# Patient Record
Sex: Male | Born: 1945
Health system: Southern US, Community
[De-identification: ages and names within clinical notes are randomized; demographics above are authoritative.]

## PROBLEM LIST (undated history)

## (undated) DIAGNOSIS — R0602 Shortness of breath: Secondary | ICD-10-CM

## (undated) DIAGNOSIS — E785 Hyperlipidemia, unspecified: Secondary | ICD-10-CM

## (undated) DIAGNOSIS — Z9889 Other specified postprocedural states: Secondary | ICD-10-CM

## (undated) DIAGNOSIS — Z5189 Encounter for other specified aftercare: Secondary | ICD-10-CM

## (undated) DIAGNOSIS — F32A Depression, unspecified: Secondary | ICD-10-CM

## (undated) DIAGNOSIS — R7303 Prediabetes: Secondary | ICD-10-CM

## (undated) DIAGNOSIS — J45909 Unspecified asthma, uncomplicated: Secondary | ICD-10-CM

## (undated) DIAGNOSIS — T7840XA Allergy, unspecified, initial encounter: Secondary | ICD-10-CM

## (undated) DIAGNOSIS — G4730 Sleep apnea, unspecified: Secondary | ICD-10-CM

## (undated) DIAGNOSIS — I10 Essential (primary) hypertension: Secondary | ICD-10-CM

## (undated) DIAGNOSIS — F419 Anxiety disorder, unspecified: Secondary | ICD-10-CM

## (undated) DIAGNOSIS — K5903 Drug induced constipation: Secondary | ICD-10-CM

## (undated) DIAGNOSIS — I2510 Atherosclerotic heart disease of native coronary artery without angina pectoris: Secondary | ICD-10-CM

## (undated) DIAGNOSIS — G4733 Obstructive sleep apnea (adult) (pediatric): Secondary | ICD-10-CM

## (undated) DIAGNOSIS — F4310 Post-traumatic stress disorder, unspecified: Secondary | ICD-10-CM

## (undated) DIAGNOSIS — Z972 Presence of dental prosthetic device (complete) (partial): Secondary | ICD-10-CM

## (undated) DIAGNOSIS — D689 Coagulation defect, unspecified: Secondary | ICD-10-CM

## (undated) DIAGNOSIS — D649 Anemia, unspecified: Secondary | ICD-10-CM

## (undated) DIAGNOSIS — I2699 Other pulmonary embolism without acute cor pulmonale: Secondary | ICD-10-CM

## (undated) DIAGNOSIS — K219 Gastro-esophageal reflux disease without esophagitis: Secondary | ICD-10-CM

## (undated) DIAGNOSIS — F329 Major depressive disorder, single episode, unspecified: Secondary | ICD-10-CM

## (undated) DIAGNOSIS — Z970 Presence of artificial eye: Secondary | ICD-10-CM

## (undated) DIAGNOSIS — H269 Unspecified cataract: Secondary | ICD-10-CM

## (undated) DIAGNOSIS — M1990 Unspecified osteoarthritis, unspecified site: Secondary | ICD-10-CM

## (undated) DIAGNOSIS — C801 Malignant (primary) neoplasm, unspecified: Secondary | ICD-10-CM

## (undated) DIAGNOSIS — L409 Psoriasis, unspecified: Secondary | ICD-10-CM

## (undated) DIAGNOSIS — G4700 Insomnia, unspecified: Secondary | ICD-10-CM

## (undated) DIAGNOSIS — R51 Headache: Secondary | ICD-10-CM

## (undated) DIAGNOSIS — IMO0001 Reserved for inherently not codable concepts without codable children: Secondary | ICD-10-CM

## (undated) DIAGNOSIS — R112 Nausea with vomiting, unspecified: Secondary | ICD-10-CM

## (undated) DIAGNOSIS — K429 Umbilical hernia without obstruction or gangrene: Secondary | ICD-10-CM

## (undated) DIAGNOSIS — R011 Cardiac murmur, unspecified: Secondary | ICD-10-CM

## (undated) DIAGNOSIS — K08109 Complete loss of teeth, unspecified cause, unspecified class: Secondary | ICD-10-CM

## (undated) DIAGNOSIS — J302 Other seasonal allergic rhinitis: Secondary | ICD-10-CM

## (undated) HISTORY — DX: Unspecified asthma, uncomplicated: J45.909

## (undated) HISTORY — PX: TONSILLECTOMY: SUR1361

## (undated) HISTORY — PX: CORONARY ANGIOPLASTY: SHX604

## (undated) HISTORY — DX: Essential (primary) hypertension: I10

## (undated) HISTORY — PX: EYE SURGERY: SHX253

## (undated) HISTORY — PX: OTHER SURGICAL HISTORY: SHX169

## (undated) HISTORY — DX: Other pulmonary embolism without acute cor pulmonale: I26.99

## (undated) HISTORY — DX: Encounter for other specified aftercare: Z51.89

## (undated) HISTORY — PX: KNEE ARTHROSCOPY: SHX127

## (undated) HISTORY — DX: Insomnia, unspecified: G47.00

## (undated) HISTORY — PX: VASECTOMY: SHX75

## (undated) HISTORY — DX: Atherosclerotic heart disease of native coronary artery without angina pectoris: I25.10

## (undated) HISTORY — DX: Obstructive sleep apnea (adult) (pediatric): G47.33

## (undated) HISTORY — PX: COLONOSCOPY W/ BIOPSIES AND POLYPECTOMY: SHX1376

## (undated) HISTORY — DX: Allergy, unspecified, initial encounter: T78.40XA

## (undated) HISTORY — DX: Coagulation defect, unspecified: D68.9

## (undated) HISTORY — DX: Depression, unspecified: F32.A

## (undated) HISTORY — PX: MULTIPLE TOOTH EXTRACTIONS: SHX2053

## (undated) HISTORY — DX: Gastro-esophageal reflux disease without esophagitis: K21.9

## (undated) HISTORY — PX: JOINT REPLACEMENT: SHX530

## (undated) HISTORY — PX: CARDIAC CATHETERIZATION: SHX172

## (undated) HISTORY — DX: Major depressive disorder, single episode, unspecified: F32.9

## (undated) HISTORY — DX: Hyperlipidemia, unspecified: E78.5

## (undated) HISTORY — DX: Sleep apnea, unspecified: G47.30

## (undated) HISTORY — DX: Unspecified osteoarthritis, unspecified site: M19.90

## (undated) HISTORY — PX: ENUCLEATION: SHX628

## (undated) HISTORY — DX: Presence of artificial eye: Z97.0

## (undated) HISTORY — DX: Unspecified cataract: H26.9

## (undated) SURGERY — EGD (ESOPHAGOGASTRODUODENOSCOPY)
Anesthesia: Monitor Anesthesia Care

## (undated) SURGERY — ARTHROPLASTY, KNEE, TOTAL
Anesthesia: Choice | Laterality: Left

---
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
## 1981-07-04 HISTORY — PX: INGUINAL HERNIA REPAIR: SUR1180

## 1983-07-05 HISTORY — PX: INGUINAL HERNIA REPAIR: SUR1180

## 1999-04-12 ENCOUNTER — Encounter: Payer: Self-pay | Admitting: Internal Medicine

## 1999-04-12 ENCOUNTER — Ambulatory Visit (HOSPITAL_COMMUNITY): Admission: RE | Admit: 1999-04-12 | Discharge: 1999-04-12 | Payer: Self-pay | Admitting: Internal Medicine

## 2000-06-30 ENCOUNTER — Ambulatory Visit (HOSPITAL_COMMUNITY): Admission: RE | Admit: 2000-06-30 | Discharge: 2000-06-30 | Payer: Self-pay | Admitting: Internal Medicine

## 2000-06-30 ENCOUNTER — Encounter: Payer: Self-pay | Admitting: Internal Medicine

## 2001-04-25 ENCOUNTER — Ambulatory Visit (HOSPITAL_COMMUNITY): Admission: RE | Admit: 2001-04-25 | Discharge: 2001-04-25 | Payer: Self-pay | Admitting: Internal Medicine

## 2001-04-25 ENCOUNTER — Encounter (INDEPENDENT_AMBULATORY_CARE_PROVIDER_SITE_OTHER): Payer: Self-pay | Admitting: *Deleted

## 2001-05-10 ENCOUNTER — Encounter: Payer: Self-pay | Admitting: Emergency Medicine

## 2001-05-10 ENCOUNTER — Emergency Department (HOSPITAL_COMMUNITY): Admission: EM | Admit: 2001-05-10 | Discharge: 2001-05-10 | Payer: Self-pay | Admitting: Emergency Medicine

## 2001-06-04 ENCOUNTER — Ambulatory Visit (HOSPITAL_COMMUNITY): Admission: RE | Admit: 2001-06-04 | Discharge: 2001-06-04 | Payer: Self-pay | Admitting: Internal Medicine

## 2001-06-04 ENCOUNTER — Encounter: Payer: Self-pay | Admitting: Internal Medicine

## 2001-07-15 ENCOUNTER — Ambulatory Visit (HOSPITAL_BASED_OUTPATIENT_CLINIC_OR_DEPARTMENT_OTHER): Admission: RE | Admit: 2001-07-15 | Discharge: 2001-07-15 | Payer: Self-pay | Admitting: Internal Medicine

## 2001-09-17 ENCOUNTER — Ambulatory Visit (HOSPITAL_COMMUNITY): Admission: RE | Admit: 2001-09-17 | Discharge: 2001-09-17 | Payer: Self-pay | Admitting: Pulmonary Disease

## 2001-09-17 ENCOUNTER — Encounter: Payer: Self-pay | Admitting: Pulmonary Disease

## 2002-01-14 ENCOUNTER — Encounter (INDEPENDENT_AMBULATORY_CARE_PROVIDER_SITE_OTHER): Payer: Self-pay | Admitting: Specialist

## 2002-01-14 ENCOUNTER — Ambulatory Visit (HOSPITAL_BASED_OUTPATIENT_CLINIC_OR_DEPARTMENT_OTHER): Admission: RE | Admit: 2002-01-14 | Discharge: 2002-01-15 | Payer: Self-pay | Admitting: *Deleted

## 2002-02-21 ENCOUNTER — Encounter: Payer: Self-pay | Admitting: Internal Medicine

## 2002-02-21 ENCOUNTER — Ambulatory Visit (HOSPITAL_COMMUNITY): Admission: RE | Admit: 2002-02-21 | Discharge: 2002-02-21 | Payer: Self-pay | Admitting: Internal Medicine

## 2002-02-26 ENCOUNTER — Encounter: Admission: RE | Admit: 2002-02-26 | Discharge: 2002-02-26 | Payer: Self-pay | Admitting: Internal Medicine

## 2002-02-26 ENCOUNTER — Encounter: Payer: Self-pay | Admitting: Internal Medicine

## 2002-07-09 ENCOUNTER — Ambulatory Visit (HOSPITAL_COMMUNITY): Admission: RE | Admit: 2002-07-09 | Discharge: 2002-07-09 | Payer: Self-pay | Admitting: Internal Medicine

## 2002-07-09 ENCOUNTER — Encounter: Payer: Self-pay | Admitting: Internal Medicine

## 2004-01-28 ENCOUNTER — Emergency Department (HOSPITAL_COMMUNITY): Admission: EM | Admit: 2004-01-28 | Discharge: 2004-01-28 | Payer: Self-pay | Admitting: Emergency Medicine

## 2004-02-02 ENCOUNTER — Inpatient Hospital Stay (HOSPITAL_BASED_OUTPATIENT_CLINIC_OR_DEPARTMENT_OTHER): Admission: RE | Admit: 2004-02-02 | Discharge: 2004-02-02 | Payer: Self-pay | Admitting: Cardiology

## 2004-02-13 ENCOUNTER — Ambulatory Visit (HOSPITAL_COMMUNITY): Admission: RE | Admit: 2004-02-13 | Discharge: 2004-02-14 | Payer: Self-pay | Admitting: Cardiology

## 2004-02-13 HISTORY — PX: CORONARY STENT PLACEMENT: SHX1402

## 2004-04-23 ENCOUNTER — Emergency Department (HOSPITAL_COMMUNITY): Admission: EM | Admit: 2004-04-23 | Discharge: 2004-04-23 | Payer: Self-pay | Admitting: Emergency Medicine

## 2004-05-25 ENCOUNTER — Encounter: Admission: RE | Admit: 2004-05-25 | Discharge: 2004-05-25 | Payer: Self-pay | Admitting: Family Medicine

## 2004-06-02 ENCOUNTER — Ambulatory Visit (HOSPITAL_BASED_OUTPATIENT_CLINIC_OR_DEPARTMENT_OTHER): Admission: RE | Admit: 2004-06-02 | Discharge: 2004-06-02 | Payer: Self-pay | Admitting: Family Medicine

## 2004-06-16 ENCOUNTER — Ambulatory Visit: Payer: Self-pay | Admitting: Psychiatry

## 2004-06-16 ENCOUNTER — Other Ambulatory Visit (HOSPITAL_COMMUNITY): Admission: RE | Admit: 2004-06-16 | Discharge: 2004-06-22 | Payer: Self-pay | Admitting: Psychiatry

## 2004-06-22 ENCOUNTER — Inpatient Hospital Stay (HOSPITAL_COMMUNITY): Admission: RE | Admit: 2004-06-22 | Discharge: 2004-06-25 | Payer: Self-pay | Admitting: Psychiatry

## 2004-06-29 ENCOUNTER — Other Ambulatory Visit (HOSPITAL_COMMUNITY): Admission: RE | Admit: 2004-06-29 | Discharge: 2004-09-27 | Payer: Self-pay | Admitting: Psychiatry

## 2004-08-17 ENCOUNTER — Encounter: Admission: RE | Admit: 2004-08-17 | Discharge: 2004-08-17 | Payer: Self-pay | Admitting: Family Medicine

## 2004-11-09 ENCOUNTER — Encounter: Admission: RE | Admit: 2004-11-09 | Discharge: 2004-11-09 | Payer: Self-pay | Admitting: Family Medicine

## 2004-11-18 ENCOUNTER — Ambulatory Visit (HOSPITAL_COMMUNITY): Admission: RE | Admit: 2004-11-18 | Discharge: 2004-11-18 | Payer: Self-pay | Admitting: *Deleted

## 2004-11-19 ENCOUNTER — Encounter: Admission: RE | Admit: 2004-11-19 | Discharge: 2004-11-19 | Payer: Self-pay | Admitting: Specialist

## 2004-11-25 ENCOUNTER — Encounter: Admission: RE | Admit: 2004-11-25 | Discharge: 2004-11-25 | Payer: Self-pay | Admitting: Specialist

## 2004-12-01 ENCOUNTER — Encounter: Admission: RE | Admit: 2004-12-01 | Discharge: 2004-12-01 | Payer: Self-pay | Admitting: Family Medicine

## 2004-12-06 ENCOUNTER — Ambulatory Visit (HOSPITAL_COMMUNITY): Admission: RE | Admit: 2004-12-06 | Discharge: 2004-12-06 | Payer: Self-pay | Admitting: Family Medicine

## 2004-12-16 ENCOUNTER — Ambulatory Visit: Payer: Self-pay | Admitting: Infectious Diseases

## 2004-12-18 ENCOUNTER — Ambulatory Visit (HOSPITAL_COMMUNITY): Admission: RE | Admit: 2004-12-18 | Discharge: 2004-12-18 | Payer: Self-pay | Admitting: Infectious Diseases

## 2004-12-22 ENCOUNTER — Ambulatory Visit: Payer: Self-pay | Admitting: Infectious Diseases

## 2005-04-26 ENCOUNTER — Encounter: Admission: RE | Admit: 2005-04-26 | Discharge: 2005-04-26 | Payer: Self-pay | Admitting: Family Medicine

## 2005-07-08 ENCOUNTER — Ambulatory Visit: Admission: RE | Admit: 2005-07-08 | Discharge: 2005-07-08 | Payer: Self-pay | Admitting: Specialist

## 2006-01-05 ENCOUNTER — Encounter: Admission: RE | Admit: 2006-01-05 | Discharge: 2006-01-05 | Payer: Self-pay | Admitting: Family Medicine

## 2006-03-07 ENCOUNTER — Encounter: Admission: RE | Admit: 2006-03-07 | Discharge: 2006-03-07 | Payer: Self-pay | Admitting: Specialist

## 2006-03-31 ENCOUNTER — Inpatient Hospital Stay (HOSPITAL_COMMUNITY): Admission: EM | Admit: 2006-03-31 | Discharge: 2006-04-04 | Payer: Self-pay | Admitting: Emergency Medicine

## 2006-04-05 ENCOUNTER — Ambulatory Visit: Payer: Self-pay | Admitting: Internal Medicine

## 2006-04-24 ENCOUNTER — Ambulatory Visit: Payer: Self-pay | Admitting: Internal Medicine

## 2006-05-23 LAB — CBC WITH DIFFERENTIAL/PLATELET
EOS%: 5.4 % (ref 0.0–7.0)
MCH: 30.1 pg (ref 28.0–33.4)
MCHC: 34.5 g/dL (ref 32.0–35.9)
MCV: 87.1 fL (ref 81.6–98.0)
MONO%: 11.2 % (ref 0.0–13.0)
RBC: 4.34 10*6/uL (ref 4.20–5.71)
RDW: 10.9 % — ABNORMAL LOW (ref 11.2–14.6)

## 2006-05-23 LAB — PROTIME-INR

## 2007-05-11 ENCOUNTER — Ambulatory Visit: Payer: Self-pay | Admitting: Internal Medicine

## 2007-05-15 LAB — CBC WITH DIFFERENTIAL/PLATELET
BASO%: 0.5 % (ref 0.0–2.0)
HCT: 39.6 % (ref 38.7–49.9)
MCHC: 34.9 g/dL (ref 32.0–35.9)
MONO#: 0.6 10*3/uL (ref 0.1–0.9)
NEUT%: 66.9 % (ref 40.0–75.0)
RBC: 4.76 10*6/uL (ref 4.20–5.71)
WBC: 5.7 10*3/uL (ref 4.0–10.0)
lymph#: 1.2 10*3/uL (ref 0.9–3.3)

## 2007-05-21 LAB — HYPERCOAGULABLE PANEL, COMPREHENSIVE
AntiThromb III Func: 89 % (ref 76–126)
Anticardiolipin IgA: <7 [APL'U] (ref <13)
Anticardiolipin IgM: 7 [MPL'U] (ref <10)
Beta-2-Glycoprotein I IgA: 5 U/mL (ref <10)
Beta-2-Glycoprotein I IgM: 23 U/mL (ref <10)
PTT Lupus Anticoagulant: 66 secs — ABNORMAL HIGH (ref 36.3–48.8)
PTTLA 4:1 Mix: 49.8 secs — ABNORMAL HIGH (ref 36.3–48.8)
PTTLA Confirmation: 0 secs (ref <8.0)

## 2007-06-07 ENCOUNTER — Encounter: Admission: RE | Admit: 2007-06-07 | Discharge: 2007-06-07 | Payer: Self-pay | Admitting: Family Medicine

## 2007-07-13 ENCOUNTER — Ambulatory Visit: Payer: Self-pay | Admitting: Internal Medicine

## 2007-07-20 LAB — HYPERCOAGULABLE PANEL, COMPREHENSIVE RET.
AntiThromb III Func: 87 % (ref 76–126)
Anticardiolipin IgA: 12 [APL'U] (ref <13)
Anticardiolipin IgG: 7 [GPL'U] (ref <11)
Beta-2-Glycoprotein I IgA: <4 U/mL (ref <10)
Beta-2-Glycoprotein I IgM: 13 U/mL (ref <10)
DRVVT: 41 secs (ref 36.1–47.0)
Homocysteine: 13.2 umol/L (ref 4.0–15.4)
PTT Lupus Anticoagulant: 45.9 secs (ref 36.3–48.8)
Protein S Ag, Total: 121 % (ref 70–140)

## 2007-09-03 ENCOUNTER — Ambulatory Visit: Payer: Self-pay | Admitting: Internal Medicine

## 2007-12-14 ENCOUNTER — Emergency Department (HOSPITAL_COMMUNITY): Admission: EM | Admit: 2007-12-14 | Discharge: 2007-12-14 | Payer: Self-pay | Admitting: Emergency Medicine

## 2008-09-12 ENCOUNTER — Encounter: Admission: RE | Admit: 2008-09-12 | Discharge: 2008-09-12 | Payer: Self-pay | Admitting: Family Medicine

## 2008-12-22 ENCOUNTER — Encounter: Admission: RE | Admit: 2008-12-22 | Discharge: 2008-12-22 | Payer: Self-pay | Admitting: Family Medicine

## 2009-02-13 ENCOUNTER — Emergency Department (HOSPITAL_COMMUNITY): Admission: EM | Admit: 2009-02-13 | Discharge: 2009-02-13 | Payer: Self-pay | Admitting: Emergency Medicine

## 2010-01-25 ENCOUNTER — Encounter: Admission: RE | Admit: 2010-01-25 | Discharge: 2010-01-25 | Payer: Self-pay | Admitting: Internal Medicine

## 2010-10-15 ENCOUNTER — Encounter: Payer: Self-pay | Admitting: Cardiology

## 2010-10-15 ENCOUNTER — Encounter: Payer: Self-pay | Admitting: Family Medicine

## 2010-10-26 ENCOUNTER — Encounter: Payer: Self-pay | Admitting: Cardiology

## 2010-10-26 DIAGNOSIS — K219 Gastro-esophageal reflux disease without esophagitis: Secondary | ICD-10-CM | POA: Insufficient documentation

## 2010-10-26 DIAGNOSIS — I2510 Atherosclerotic heart disease of native coronary artery without angina pectoris: Secondary | ICD-10-CM | POA: Insufficient documentation

## 2010-10-26 DIAGNOSIS — E785 Hyperlipidemia, unspecified: Secondary | ICD-10-CM | POA: Insufficient documentation

## 2010-10-26 DIAGNOSIS — F329 Major depressive disorder, single episode, unspecified: Secondary | ICD-10-CM | POA: Insufficient documentation

## 2010-10-26 DIAGNOSIS — I2699 Other pulmonary embolism without acute cor pulmonale: Secondary | ICD-10-CM | POA: Insufficient documentation

## 2010-10-26 DIAGNOSIS — I10 Essential (primary) hypertension: Secondary | ICD-10-CM | POA: Insufficient documentation

## 2010-10-26 DIAGNOSIS — G4700 Insomnia, unspecified: Secondary | ICD-10-CM | POA: Insufficient documentation

## 2010-10-26 DIAGNOSIS — G4733 Obstructive sleep apnea (adult) (pediatric): Secondary | ICD-10-CM | POA: Insufficient documentation

## 2010-10-26 DIAGNOSIS — F32A Depression, unspecified: Secondary | ICD-10-CM | POA: Insufficient documentation

## 2010-11-01 ENCOUNTER — Encounter: Payer: Self-pay | Admitting: Family Medicine

## 2010-11-02 ENCOUNTER — Ambulatory Visit (INDEPENDENT_AMBULATORY_CARE_PROVIDER_SITE_OTHER): Payer: Medicare Other | Admitting: Cardiology

## 2010-11-02 ENCOUNTER — Encounter: Payer: Self-pay | Admitting: Cardiology

## 2010-11-02 VITALS — BP 108/66 | HR 68 | Ht 66.0 in | Wt 247.4 lb

## 2010-11-02 DIAGNOSIS — I2510 Atherosclerotic heart disease of native coronary artery without angina pectoris: Secondary | ICD-10-CM

## 2010-11-02 DIAGNOSIS — I2699 Other pulmonary embolism without acute cor pulmonale: Secondary | ICD-10-CM

## 2010-11-02 DIAGNOSIS — I10 Essential (primary) hypertension: Secondary | ICD-10-CM

## 2010-11-02 DIAGNOSIS — E785 Hyperlipidemia, unspecified: Secondary | ICD-10-CM

## 2010-11-02 NOTE — Assessment & Plan Note (Signed)
Patient has mild anginal symptoms at high level of exertion. Have recommended a followup nuclear stress test to evaluate his current cardiac situation. I recommended continued aggressive risk factor modification. His stress test is normal I would recommend increasing his aerobic activity and weight loss.

## 2010-11-02 NOTE — Patient Instructions (Signed)
We will schedule you for a nuclear stress test.  We will get a copy of your lab work from Dr. Riley Nearing  If your stress test is okay then we need to focus on controlling your cholesterol and blood pressure.  You need to increase your aerobic activity and lose weight.

## 2010-11-02 NOTE — Assessment & Plan Note (Signed)
Blood pressure is well controlled currently. He will stay on the same medication.

## 2010-11-02 NOTE — Assessment & Plan Note (Signed)
We will obtain a copy of his most recent lab work. His goal LDL would be less than or equal to 70.

## 2010-11-02 NOTE — Progress Notes (Signed)
Aaron [name] Date of Birth: [birth year]   History of Present Illness: Aaron [name] is seen at the request of Dr. Riley Nearing for evaluation of chest pain. He is a 65 year old white male with a known history of coronary disease. He underwent stenting of the mid LAD in August of 2005 with a 3.0 x 13 mm Cypher stent. He presented at that time with chest pain and was found to have a 70% lesion in the mid LAD. He had a stress nuclear study which showed evidence of anteroapical ischemia. He has been lost to followup since that time. He does describe symptoms of chest discomfort if he works hard. His main form of activity is mowing grass. If he is mowing a lot of grass and he gets chest discomfort he just rests and the pain goes away. He has occasional shortness of breath.  Current Outpatient Prescriptions on File Prior to Visit  Medication Sig Dispense Refill  . ALPRAZolam (XANAX) 0.5 MG tablet Take 0.5 mg by mouth at bedtime as needed.        . Ascorbic Acid (VITAMIN C) 1000 MG tablet Take 1,000 mg by mouth daily.        Marland Kitchen aspirin 81 MG tablet Take 81 mg by mouth daily.        . Cholecalciferol (VITAMIN D3) 1000 UNITS CAPS Take by mouth daily.        Marland Kitchen ezetimibe-simvastatin (VYTORIN) 10-40 MG per tablet Take 1 tablet by mouth at bedtime.        . fenofibrate micronized (LOFIBRA) 200 MG capsule Take 200 mg by mouth daily before breakfast.        . Glucosamine-Chondroitin 750-600 MG TABS Take 1 tablet by mouth daily.        Marland Kitchen lisinopril-hydrochlorothiazide (PRINZIDE,ZESTORETIC) 20-12.5 MG per tablet Take 1 tablet by mouth 2 (two) times daily.        . meloxicam (MOBIC) 15 MG tablet Take 15 mg by mouth daily.        . Multiple Vitamin (MULTIVITAMIN) tablet Take 1 tablet by mouth daily.        . Omega-3 Fatty Acids (FISH OIL) 1000 MG CAPS Take by mouth daily.        Marland Kitchen omeprazole (PRILOSEC) 20 MG capsule Take 20 mg by mouth daily.        . polyethylene glycol powder (MIRALAX) powder Take 17 g by mouth daily.         . traMADol (ULTRAM) 50 MG tablet Take 50 mg by mouth every 6 (six) hours as needed.        . venlafaxine (EFFEXOR-XR) 37.5 MG 24 hr capsule Take 37.5 mg by mouth 2 (two) times daily.        Marland Kitchen DISCONTD: simvastatin (ZOCOR) 40 MG tablet Take 40 mg by mouth at bedtime.         Allergies  Allergen Reactions  . Amoxicillin     Past Medical History  Diagnosis Date  . Coronary artery disease     DES LAD 2005  . Hypertension   . Hyperlipidemia   . Depression   . GERD (gastroesophageal reflux disease)   . Insomnia   . Sleep apnea, obstructive   . Pulmonary embolism   . Osteoarthritis     Past Surgical History  Procedure Date  . Spinal injections     x9 for back  . Artificial right eye   . Hernia repair     bilateral  . Carpel tunnel release  History  Smoking status  . Former Smoker  . Types: Cigarettes  . Quit date: 07/10/1995  Smokeless tobacco  . Never Used    History  Alcohol Use No    History reviewed. No pertinent family history.  Review of Systems: The review of systems is positive for chronic arthralgias in his knees and feet. He has occasional cramps in his right calf.  All other systems were reviewed and are negative.  Physical Exam: BP 108/66  Pulse 68  Ht 5\' 6"  (1.676 m)  Wt 247 lb 6 oz (112.209 kg)  BMI 39.93 kg/m2 He is an obese white male in no acute distress. He is normocephalic atraumatic. He has a prosthesis in his right thigh. His oropharynx is clear with upper and lower dental plates. Neck is supple without JVD, adenopathy, thyromegaly, or bruits. Lungs are clear. Cardiac exam reveals a regular rate and rhythm without gallop, murmur, or click. Abdomen is obese, soft, nontender. He has a ventral hernia with an umbilical hernia. Pedal pulses are 2+ and symmetric. He has no edema. Skin is warm and dry. He is alert and oriented x3. Cranial nerves II through XII are intact. LABORATORY DATA: ECG demonstrates normal sinus rhythm with a normal  ECG.  Assessment / Plan:

## 2010-11-09 ENCOUNTER — Telehealth: Payer: Self-pay | Admitting: Cardiology

## 2010-11-09 NOTE — Telephone Encounter (Signed)
PATSY  FAX [phone] NEEDS NUKE AND ECHO

## 2010-11-10 ENCOUNTER — Ambulatory Visit (HOSPITAL_COMMUNITY): Payer: 59 | Attending: Cardiology | Admitting: Radiology

## 2010-11-10 VITALS — Ht 66.0 in | Wt 243.0 lb

## 2010-11-10 DIAGNOSIS — R079 Chest pain, unspecified: Secondary | ICD-10-CM

## 2010-11-10 DIAGNOSIS — I2510 Atherosclerotic heart disease of native coronary artery without angina pectoris: Secondary | ICD-10-CM

## 2010-11-10 MED ORDER — TECHNETIUM TC 99M TETROFOSMIN IV KIT
10.4000 | PACK | Freq: Once | INTRAVENOUS | Status: AC | PRN
  Administered 2010-11-10: 10 via INTRAVENOUS

## 2010-11-10 MED ORDER — TECHNETIUM TC 99M TETROFOSMIN IV KIT
32.3000 | PACK | Freq: Once | INTRAVENOUS | Status: AC | PRN
  Administered 2010-11-10: 32.3 via INTRAVENOUS

## 2010-11-10 MED ORDER — REGADENOSON 0.4 MG/5ML IV SOLN
0.4000 mg | Freq: Once | INTRAVENOUS | Status: AC
  Administered 2010-11-10: 0.4 mg via INTRAVENOUS

## 2010-11-10 MED ORDER — AMINOPHYLLINE 25 MG/ML IV SOLN
75.0000 mg | INTRAVENOUS | Status: AC
  Administered 2010-11-10: 75 mg via INTRAVENOUS

## 2010-11-10 NOTE — Progress Notes (Signed)
[facility] SITE 3 NUCLEAR MED [address] [place] [address] [phone]  Cardiology Nuclear Med Study  Aaron [name] is a 65 y.o. male [id number] 1945-10-12   Nuclear Med Background Indication for Stress Test:  Evaluation for Ischemia, Stent Patency and PTCA Patency History: 08/05 Heart Catheterization: EF 55% 70% LAD single vessel, '05 Myocardial Perfusion Study: anteroapical ischemia and 08/05 Stents:LAD Cardiac Risk Factors: History of Smoking, Hypertension and Lipids  Symptoms:  Chest Pain with Exertion and DOE   Nuclear Pre-Procedure Caffeine/Decaff Intake:  None NPO After: 8:00pm   Lungs:  clear IV 0.9% NS with Angio Cath:  20g  IV Site: L Antecubital  IV Started by:  [name], EMT-P  Chest Size (in):  50 Cup Size: n/a  Height: 5\' 6"  (1.676 m)  Weight:  243 lb (110.224 kg)  BMI:  Body mass index is 39.22 kg/(m^2). Tech Comments: This patient had extreme joint and back pain.  He was given Aminophlylline 75 mg IV. His pain was relieved.     Nuclear Med Study 1 or 2 day study: 1 day  Stress Test Type:  Eugenie Birks  Reading MD: Marca Ancona, MD  Order Authorizing Provider:  P.Jordan  Resting Radionuclide: Technetium 25m Tetrofosmin  Resting Radionuclide Dose: 10.4 mCi   Stress Radionuclide:  Technetium 64m Tetrofosmin  Stress Radionuclide Dose: 32.3 mCi           Stress Protocol Rest HR: 54 Stress HR: 82  Rest BP: 130/64 Stress BP: 130/64  Exercise Time (min): n/a METS: n/a   Predicted Max HR: 156 bpm % Max HR: 52.56 bpm Rate Pressure Product: 21308   Dose of Adenosine (mg):  n/a Dose of Lexiscan: 0.4 mg  Dose of Atropine (mg): n/a Dose of Dobutamine: n/a mcg/kg/min (at max HR)  Stress Test Technologist: Milana Na, EMT-P  Nuclear Technologist:  Domenic Polite, CNMT     Rest Procedure:  Myocardial perfusion imaging was performed at rest 45 minutes following the intravenous administration of Technetium 37m Tetrofosmin. Rest ECG:  Sinus Bradycardia  Stress Procedure:  The patient received IV Lexiscan 0.4 mg over 15-seconds.  Technetium 94m Tetrofosmin injected at 30-seconds.  There were no significant changes with Lexiscan.  Quantitative spect images were obtained after a 45 minute delay. Stress ECG: No significant change from baseline ECG  QPS Raw Data Images:  Normal; no motion artifact; normal heart/lung ratio. Stress Images:  Normal homogeneous uptake in all areas of the myocardium. Rest Images:  Normal homogeneous uptake in all areas of the myocardium. Subtraction (SDS):  There is no evidence of scar or ischemia. Transient Ischemic Dilatation (Normal <1.22):  1.01 Lung/Heart Ratio (Normal <0.45):  0.34  Quantitative Gated Spect Images QGS EDV:  135 ml QGS ESV:  56 ml QGS cine images:  Normal Wall Motion QGS EF: 59%  Impression Exercise Capacity:  Lexiscan with no exercise. BP Response:  Normal blood pressure response. Clinical Symptoms:  Short of breath.  ECG Impression:  No significant ST segment change suggestive of ischemia. Comparison with Prior Nuclear Study: No images to compare  Overall Impression:  Normal stress nuclear study. Amen Staszak [employer]

## 2010-11-11 NOTE — Progress Notes (Signed)
COPY ROUTED TO DR.JORDAN.Falecha Clark ° °

## 2010-11-11 NOTE — Progress Notes (Signed)
Normal nuclear study. Please reassure patient. Continue medical RX. [name]

## 2010-11-11 NOTE — Progress Notes (Signed)
Notified of nuclear stress test results. Is changing PCP and will let us know.

## 2010-11-13 ENCOUNTER — Ambulatory Visit (INDEPENDENT_AMBULATORY_CARE_PROVIDER_SITE_OTHER): Payer: 59

## 2010-11-13 ENCOUNTER — Inpatient Hospital Stay (INDEPENDENT_AMBULATORY_CARE_PROVIDER_SITE_OTHER)
Admission: RE | Admit: 2010-11-13 | Discharge: 2010-11-13 | Disposition: A | Discharge status: Home / Self Care | Payer: 59 | Source: Ambulatory Visit | Attending: Emergency Medicine | Admitting: Emergency Medicine

## 2010-11-13 DIAGNOSIS — S61209A Unspecified open wound of unspecified finger without damage to nail, initial encounter: Secondary | ICD-10-CM

## 2010-11-19 NOTE — H&P (Signed)
[name], [name] NO.:  [id number]   MEDICAL RECORD NO.:  [id number]          PATIENT TYPE:  IPS   LOCATION:  0301                          FACILITY:  [facility]   PHYSICIAN:  Aaron [name], M.D.      DATE OF BIRTH:  02/22/1946   DATE OF ADMISSION:  06/22/2004  DATE OF DISCHARGE:                         PSYCHIATRIC ADMISSION ASSESSMENT   IDENTIFYING INFORMATION:  This is a voluntary admission.  This is a 65-year-  old recently married white male.  Reason for admission and symptoms:  He has  been in the psychiatric IOP for the past 3 days.  Today, he could not  contract for safety.  His wife is driving due to his mind wandering and also  visual problems.  The patient states that he began going downhill  approximately 6 months ago.  In August he was admitted dyspnea on exertion  and anterior chest pain.  He underwent cardiac catheterization and  ultimately had a stent put in to a 30% stenosis.  He was found to not be  diabetic at that time.  Recently, he has come under cardiac care with Dr.  Swaziland.  He has had his medications adjusted.  He and his wife are wondering  if perhaps these medications are causing his depression.  Interestingly  enough, in his family history his mother shot herself in 54.  She had been  medically ill.  His father attempted suicide but was not successful.  His  father ultimately died some years later of medical problems.  His sister who  was living with them at the time his mother shot herself and died, died  later of an illness.  Her youngest son, his nephew, shot himself 2 years  later.  There is no formal history for bipolar illness in the family,  however one would have to really think about this in his situation.   The patient describes multiple stressors.  His wife of almost 33 years died  in 11/11/02 from cancer.  They had decided several months before that  she would not continue treatment and she passed on.  He had to file  bankruptcy in 2002.  He recently met his new wife.  He is very happy with  her.  They just bought a new house, however she just lost her job and he has  had several hospital admissions since July.  One was to put the stent in,  and I forget what he told me the other one was.   PAST PSYCHIATRIC HISTORY:  He has no prior inpatient treatment.  He has  recently begun outpatient treatment with Dr. Donell [name] and had started the  intensive outpatient treatment here.   SOCIAL HISTORY:  He completed the 8th grade, ultimately got a GED.  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  He has had 3 marriages.  The first marriage only  lasted 5 month, the second lasted 33 years, and his new wife he has been  married to 6 months.  He  has 3 children.  His oldest is a son, 57, a  daughter 46, and another son 66.   FAMILY HISTORY:  Primary care Aaron [name] is Dr. Susann [name] and his cardiologist  is Dr. Swaziland.  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  He snapped a rubber band into his eye.  It  ruptured his pupil.  He had multiple surgeries through the years resulting  in enucleation.   MEDICATIONS:  He is currently prescribed Vytorin 10/40 1 p.o. daily, Zoloft  150 mg p.o. daily, Celebrex 200 mg p.o. daily, Protonix 40 mg p.o. daily,  Plavix 75 mg p.o. daily, Coreg 12.5 mg p.o. b.i.d., hydrochlorothiazide 12.5  mg p.o. daily, Micardis 40 mg p.o. daily, Furosemide 40 mg 2 p.o. daily,  potassium 40 mEq p.o. daily, Metamucil 1 packet at h.s., calcium 600 mg p.o.  daily, vitamin E 400 IU p.o. daily, vitamin C 800 mg 2 p.o. daily,  multivitamin 1 p.o. daily, Nasonex 10 mg 2 sprays each nostril in the a.m.,  Astelin 137 mcg 2 sprays each nostril b.i.d., Zyrtec 10 mg p.o. daily,  Clorazepate 7.5 mg at h.s. p.r.n. sleep, and hydrocodone 10/650 1 tab p.o.  q.6h p.r.n. pain.  He states that he began taking  Zoloft approximately 3  years ago.  Initially he was taking 25-50 mg a day.  When his wife became  terminally ill it was increased to 100 mg a day.  This past summer he tried  to decrease the Zoloft, however this did not go well and he was taking  Zoloft back in August when he was admitted for the stent, and recently Dr.  Donell [name] has increased it.  He should be on 200 mg a day.  At some point  along the line, they stated that somebody added Wellbutrin for about 10  days, however he seemed to get worse, and hence the Wellbutrin was stopped.   ALLERGIES:  No known drug allergies.   POSITIVE PHYSICAL FINDINGS:  PHYSICAL EXAMINATION:  He appears his stated  age.  His physical examination is well documented.   MENTAL STATUS EXAM:  He is alert and oriented x3.  His appearance is he is  appropriately groomed and casually dressed.  His speech was not pressured  although he was somewhat tangential.  His mood is depressed, his affect  shows an appropriate range.  His thought processes are clear, rational and  goal oriented.  Judgment and insight are intact.  Intelligence is average.  Concentration and memory are intact.  He denies active suicidal or homicidal  ideation, however given his various family members he did not want to take  the chance that he would become suicidal.  He denies auditory or visual  hallucinations.   ADMISSION DIAGNOSES:   AXIS I:  Depressive disorder not otherwise specified from chronic illness,  rule out bipolar/depressed phase.   AXIS II:  None.   AXIS III:  Obese, a stent August 2005, hypertension, hypercholesterolemia,  gastroesophageal reflux disease, osteoarthritis and sleep apnea.  On  admission his blood glucose was elevated at 128.   AXIS IV:  Moderate, psychosocial problems, grief over wife's death, fear  over potential bankruptcy due to wife's loss of work, and medical problems.  AXIS V:  Global assessment of function is 30.   PLAN:  The plan is to  admit for safety and stabilization, to adjust her  medications as indicated.  Toward that end we will  be calling Dr. Swaziland,  his cardiologist, to come over and adjust his medications, and Dr. Dub [name] will  be determining whether he feels this gentleman is bipolar or not.  His  admission blood glucose was 128.  We will check his fasting blood sugar in  the morning.  We will be checking CBGs a.c. and h.s. and we will also get a  hemoglobin A1C.     Mick   MD/MEDQ  D:  06/23/2004  T:  06/24/2004  Job:  [id number]

## 2010-11-19 NOTE — H&P (Signed)
NAMEHAZEL, Aaron [name]                ACCOUNT NO.:  [id number]   MEDICAL RECORD NO.:  [id number]          PATIENT TYPE:  INP   LOCATION:  6734                         FACILITY:  [facility]   PHYSICIAN:  Merlene Laughter. Renae Gloss, M.D.DATE OF BIRTH:  10/01/1945   DATE OF ADMISSION:  03/31/2006  DATE OF DISCHARGE:                                HISTORY & PHYSICAL   PRIMARY CARE PHYSICIAN:  Talmadge Coventry, M.D.   CARDIOLOGIST:  Peter M. Swaziland, M.D.   CHIEF COMPLAINT:  Chest pain.   HISTORY OF PRESENT ILLNESS:  Aaron [name] is a 65 year old gentleman who  presents with a 2-day history of left upper sternal border chest pain which  has been quite severe.  Aaron [name] states that Aaron [name] first thought that pain was  related to his musculoskeletal pain that Aaron [name] usually has; however, Aaron [name] noted  Aaron [name] had significant shortness of breath with this episode.  Aaron [name] was seen by  Dr. Ethelene Hal this morning for lumbar epidural injection for chronic back pain.  However, Aaron [name] started having more severe chest pain during the procedure.  Aaron [name]  was sent over to the ED for further evaluation, and a CT angiogram confirmed  pulmonary embolus.  Aaron [name] is being admitted for evaluation and treatment of  pulmonary embolus.   ALLERGIES AND INTOLERANCES:  AMOXIL causes palpitations.   PAST MEDICAL HISTORY:  Significant for:  1. Hypertension.  2. Hyperlipidemia.  3. Anxiety and depression.  4. History of lower back and knee pain.  5. History of coronary artery disease, status post PTCA with stent per Dr.      Swaziland in 2005.   SOCIAL HISTORY:  The patient is married.  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  Aaron [name] quit smoking  10 years ago.  Aaron [name] has no history of alcohol or drugs of abuse.   REVIEW OF SYSTEMS:  Significant for leg cramps.  Rectal bleeding due to  constipation from narcotic medications.  Aaron [name] states Aaron [name] had a colonoscopy in  2005 which revealed 3-4 benign polyps.  Colonoscopy was performed per Dr.  Marina Goodell.   FAMILY HISTORY:  Significant for coronary artery  disease in mother who died  at 74, sister who died at age 45 from breast cancer.   MEDICATIONS:  1. Vitorin 10/40 one p.o. daily.  2. Enterra 130 mg p.o. daily.  3. Celebrex 200 mg p.o. b.i.d.  4. Protonix 40 mg p.o. daily.  5. Effexor 75 mg p.o. daily.  6. Xanax 0.5 mg p.o. b.i.d.  7. Ambien 10 mg p.o. nightly.  8. Robaxin 500 mg p.o. daily.  9. Ultram 300 mg p.o. daily.  10.Micardis 40 mg p.o. daily.  11.Hydrochlorothiazide 12.5 mg p.o. daily.  12.Aspirin 81 mg p.o. daily.  13.Cialis p.r.n.  14.Metamucil.   PHYSICAL EXAMINATION:  GENERAL APPEARANCE:  Well-developed, well-nourished  male in no acute distress.  VITAL SIGNS: Temperature 98.2, blood pressure 157/81, pulse 76, respirations  20, O2 saturation on room air 94%.  HEENT:  No oropharyngeal lesions.  NECK:  Supple, no masses, 2+ carotids, no bruits.  LUNGS: Clear to auscultation bilaterally.  HEART: S1, S2.  Regular rate  and rhythm.  No murmurs, rubs, or gallops.  ABDOMEN: Soft, nontender, nondistended.  Positive bowel sounds.  EXTREMITIES:  No clubbing, cyanosis, or edema.  SKIN: Warm, intact.  NEUROLOGIC:  Alert and oriented x3. Cranial nerves intact.   LABORATORY DATA:  WBC 7.6, hemoglobin 12.3, platelet count 228. INR 1.1   CT angiogram, as above, is positive for bilateral pulmonary emboli with mild  patchy areas of atelectasis.  CT angiogram also reveals a stable right lower  lobe nodule.   ASSESSMENT AND PLAN:  1. Pulmonary embolus.  This is the most likely source of this patient's      chest pain, although Aaron [name] does have cardiac risk factors.  Heparin and      Coumadin anticoagulation will be followed per pharmacy protocol.      Coagulation studies including lupus anticoagulant, antithrombin 3,      protein C and S, factor V will be obtained.  2. Hypertension.  Aaron [name] blood pressure medications will be adjusted      for persistently elevated blood pressure relief.            ______________________________  Merlene Laughter. Renae Gloss, M.D.     KRS/MEDQ  D:  03/31/2006  T:  04/02/2006  Job:  [id number]   cc:   Talmadge Coventry, M.D.  Peter M. Swaziland, M.D.

## 2010-11-19 NOTE — Cardiovascular Report (Signed)
NAME:  Aaron [name], Aaron [name]                          ACCOUNT NO.:  [id number]   MEDICAL RECORD NO.:  [id number]                   PATIENT TYPE:  OIB   LOCATION:  6525                                 FACILITY:  [facility]   PHYSICIAN:  Peter M. Swaziland, M.D.               DATE OF BIRTH:  05-28-1946   DATE OF PROCEDURE:  02/13/2004  DATE OF DISCHARGE:  02/14/2004                              CARDIAC CATHETERIZATION   INDICATIONS FOR PROCEDURE:  The patient is a 65 year old white male who  presents with predominant symptoms of dyspnea.  He underwent cardiac  catheterization which demonstrated a borderline obstructive lesion in the  mid LAD estimated at 70%.  He subsequently had a stress Cardiolite study  which did demonstrate mild ischemia involving the distal anterior wall and  apex.  He was brought back for percutaneous intervention.  Access is via the  right femoral artery using standard Seldinger technique.   EQUIPMENT:  Equipment was 6 [place] 4 cm left Judkins guide, a 0.14 high-  torque floppy wire, and 3 x 13 mm Cypher drug-eluding stent, a 3 x 12 mm  Quantum balloon.   MEDICATIONS:  1. Versed 2 mg IV.  2. Phentanyl 25 mcg IV.  3. Angiomax bolus at 0.75 mg/kg followed by a drip at 1.75 mg/kg per hour     with an ACT of 312.  4. Nitroglycerin 200 mcg of coronary x2.   CONTRAST:  120 cc of Omnipaque.   DESCRIPTION OF PROCEDURE:  After initial guide shots were obtained, we  crossed the lesion in the mid LAD without difficulty.  This was primarily  stented using a 3 x 13 mm Cypher drug-eluding stent.  This was deployed at  11 atmospheres and then dilated again to 14 atmospheres.  We then exchanged  for the Quantum balloon and again post-dilated the stent up to 16  atmospheres x1.  This yielded an excellent angiographic result, 0% residual  stenosis, and TIMI grade III flow.  The patient had no complications.  His  right groin site was closed using an Angioseal device.   ASSESSMENT:  1.  Single vessel obstructive coronary artery disease.  2. Successful stenting of the mid left anterior descending.   PLAN:  Continue aspirin and Plavix.                                               Peter M. Swaziland, M.D.   PMJ/MEDQ  D:  02/13/2004  T:  02/14/2004  Job:  [id number]   cc:   Sharlot Gowda, M.D.  [address]  [place], [place] [address]  Fax: [phone]

## 2010-11-19 NOTE — Op Note (Signed)
[place]. [facility]  Patient:    Aaron [name], HYAMS Visit Number: [id number] MRN: [id number]          Service Type: DSU Location: [facility] Attending Physician:  Carlena Sax Dictated by:   Veverly Fells. Arletha Grippe, M.D. Proc. Date: 01/14/02 Admit Date:  01/14/2002 Discharge Date: 01/14/2002   CC:         Danice Goltz, M.D.  Lilly Cove, M.D.   Operative Report  PREOPERATIVE DIAGNOSES: 1. Obstructive sleep apnea. 2. Nasal airway obstruction. 3. Nasal septal deviation. 4. Bilateral chronic ethmoid sinusitis. 5. Bilateral chronic maxillary sinusitis.  POSTOPERATIVE DIAGNOSES: 1. Obstructive sleep apnea. 2. Nasal airway obstruction. 3. Nasal septal deviation. 4. Bilateral chronic ethmoid sinusitis. 5. Bilateral chronic maxillary sinusitis.  OPERATION/PROCEDURE:  Nasal septal reconstruction, bilateral endoscopic  total ethmoidectomies, bilateral endoscopic maxillary antrostomies using the InstaTrak system for stereotactic computer system navigation.  SURGEON:  Veverly Fells. Arletha Grippe, M.D.  ANESTHESIA:  General endotracheal anesthesia.  INDICATION FOR PROCEDURE:  This is an otherwise healthy 65 year old white male who has a history of nasal airway obstruction, obstructive sleep apnea, who has currently been on nasal CPAP.  He does give a history of persistent nasal airway obstruction and recurrent sinusitis.  A CT scan of the sinuses obtained after prolonged courses of antibiotic therapy and supportive medications that show bilateral chronic ethmoid and maxillary sinusitis with septal deviation to the right.  Physical examination did show septal deviation to the right with congestion of the inferior turbinates bilaterally.  Based on his history, physical examination and failure of aggressive medical therapy, I have recommended proceeding with the above noted surgical procedure.  I have discussed extensively with the patient and his family the risks and  benefits from surgery included risks from general anesthesia, infection, bleeding, orbital CNS injury, need for septal splinting and light nasal packing and the normal recovery period to expect after this type of surgery. I have entertained any questions, answered them appropriately and informed consent has been obtained. The patient presented for the above noted procedure.  OPERATIVE FINDINGS: Mucopurulent material involving the ethmoid and maxillary sinuses bilaterally with a moderate septal deviation to the right.  DESCRIPTION OF PROCEDURE:  The patient was brought to the operating room and placed in the supine position. General endotracheal anesthesia administered via the anesthesiologist without complications.  The patient was administered 600 mg of clindamycin IV x1 and 10 mg of Decadron IV x1.  Head of the table was elevated 30 degrees.  A throat pack was placed in the posterior pharynx using a small baby lap sponge. Bilateral sphenopalatine blocks were administered via the greater palatine foremen each with 1.5 cc of a 1% lidocaine solution with 1:1000 epinephrine. After this was done, the patients face was draped in standard fashion.  A standard Killian incision was made to the left side of the septum.  Mucoperichondrial, mucoperiosteal flap was elevated on the left side using both blunt and sharp dissection.  This was done after cotton pledgets were placed in both nasal chambers for approximately 10 minutes and then removed, soaked in 4% cocaine and some 1% lidocaine solution with 1:1000 epinephrine was infiltrated into either side of the septum.  After this dissection was carried out, intercartilaginous incision was made approximately 1 cm posterior from the caudal line of the septum.  Mucoperichondrial and mucoperiosteal flap was elevated on the right side using both blunt and sharp dissection.  Cartilaginous deviation was removed with Ballenger septal knife. Posterior bony  deflection was removed with  a Jansen-Middleton forceps.  Septal spur was removed using a large Jansen-Middleton forceps without difficulty.  Septal flaps were placed back in the midline position.  Next, using zero degree rigid endoscopy, the anterior portion of both middle turbinates and the uncinated processes were injected with a 1% lidocaine solution with 1:1000 epinephrine.  Cotton pledgets soaked in a 4% cocaine solution were placed in both middle meatus, left in place fro approximately five to 10 minutes and then removed.  The InstaTrak head piece was placed on the patients head and was calibrated to straight suction and aspirated per protocol.  This was used throughout the entire case to identify landmarks, lamina, papyracea and skull base which were not violated at any time.  Next, the attention was turned to the left nasal chamber.  Using zero degree rigid endoscopic guidance and the MicroFrance back elevator, the uncinate process was back elevated and uncinectomy was performed with combination of through cutting forceps and the microdebrider without difficulty.  After sinus ostium, maxillary sinus was identified and was then enlarged using through cutting forceps and the microdebrider.  Then a 70 degree rigid endoscope was used to identify the contents of maxillary sinus. Some thickened mucosa was noted and a little bit of some mucopurulent material which was suctioned using a J suction catheter but no other lesions or masses were noted.  No further biopsies were taken.  Next, using 0 degree rigid endoscopy and the Instatrak system for guidance, an anterior and posterior ethmoidectomy was performed from an anterior posterior fashion using through cutting forceps and the microdebrider without difficulty.  _________ was identified and was minimally manipulated due to lack of disease in this area.  Next, attention was turned to the right nasal chamber.  The identical  procedure  was carried out on this side compared to left side with identical results.  After this was done, both sinus cavities were reinspected. There was no evidence of any significant active bleeding.  Kennedy packs soaked in Bactroban ointment solution placed in both middle meatus.  A piece of trimmed _________ cartilage was placed in between the septal flaps.  Septal incision was closed with multiple interrupted chromic sutures and segments were reinforced with running transeptal plane gut mattress stitch.  Both inferior turbinates were injected with total of 6 cc of 1% lidocaine solution with 1:1000 epinephrine. Both inferior turbinates were then intramurally cauterized using Elmed intramural cauterization using a 12 watt setting.  Three passes of both inferior turbinates were performed on both inferior turbinates and both inferior turbinates were then out fractured using gentle lateral pressure with large nasal speculum.  Doyle splint soaked in a Bactroban ointment solution placed on either side of the septum, held in place with transeptal Prolene suture.  Throat pack was removed.  Oral gastric tube was placed and used to decompress stoma contents.  It was then removed without incident.  FLUIDS GIVEN DURING PROCEDURE:  Approximately 1100 Crystalloid.  ESTIMATED BLOOD LOSS:  Less than 30 cc.  URINE OUTPUT:  Not measured.  DRAINS:  No drains, above noted 2A Kennedy packs and Doyle splints were placed. SPECIMENS SENT:  Septal cartilage and bone for gross pathology only and sinus contents for culture and sensitivity and pathology.  The patient tolerated the procedure well without complications and was extubated in the operating room and transferred to the recovery room in stable condition.  COUNTS:  Sponge, needle and instrument counts were correct at the end of the procedure.  DURATION OF PROCEDURE:  Approximately  two hours.  PLAN:  The patient will be admitted for overnight recovery.   Once he is recovered well, he will be sent home on January 15, 2002.  MEDICATIONS: 1. Levaquin 500 mg p.o. q.d. x14 days. 2. Vicodin #30 with three refills one to two tablets p.o. q.4h. p.r.n. pain.  INSTRUCTIONS: He is to have light activity, no heavy lifting or nose blowing for two weeks after surgery. Both him and his family given oral and written instructions.  They are to call if any problems of bleeding, fever, vomiting, pain, or extra medications or any other questions.  FOLLOW-UP:  He will follow-up in the office for splint pack removal and bilateral endoscopic debridement of sinus cavities on Monday, January 21, 2002, at 9:30 a.m.  Dictated by:   Veverly Fells. Arletha Grippe, M.D. Attending Physician:  Carlena Sax DD:  01/14/02 TD:  01/17/02 Job: [id number]

## 2010-11-19 NOTE — Discharge Summary (Signed)
[name], [name]:  [id number]   MEDICAL RECORD NO.:  [id number]          PATIENT TYPE:  INP   LOCATION:  6734                         FACILITY:  [facility]   PHYSICIAN:  Aaron [name], M.D. DATE OF BIRTH:  1945/10/17   DATE OF ADMISSION:  03/31/2006  DATE OF DISCHARGE:  04/04/2006                                 DISCHARGE SUMMARY   PRIMARY CARE PHYSICIAN:  Talmadge Coventry, M.D.   FINAL DIAGNOSES:  1. Bilateral pulmonary embolism.  2. Rectal bleed secondary to bleeding hemorrhoids.  3. Anemia.   CONSULTATIONS:  Gastroenterology with Dr. Iva Boop and Dr. Lina Sar.   PROCEDURES:  1. CT scan of the chest with angiographic contrast.  2. Portable chest x-ray completed March 31, 2006.   HISTORY OF PRESENT ILLNESS:  [name] is a 65 year old gentleman who  arrived with a chief complaint of chest pain.  He stated that he had  experienced two days' of chest pain over the left upper sternal border.  He  has a history of musculoskeletal pain and initially attributed symptoms to  this.  However, he began to develop progressive shortness of breath which  was unusual for him.  He was seen by Dr. Ethelene Hal earlier in the morning for a  lumbar epidural secondary to chronic back pain.  He began to have a  progression of his chest pain during the procedure.  He was sent to the ER  for further evaluation and was diagnosed via CT scan of the chest with  pulmonary embolism.   For past medical history, please see that dictated by Dr. Andi Devon  on March 31, 2006.   HOSPITAL COURSE:  1. Bilateral pulmonary embolism.  CT scan of the patient's chest was      completed on September 28.  The results revealed positive bilateral      pulmonary emboli, mild bilateral patchy areas of atelectasis were seen      also.  A stable right lower lobe nodule was also seen.  Comparisons      were made to a study that was done back on Dec 01, 2004 and the  radiologist noted that the nodules that were seen during that exam back      in 2006 were no longer seen on this exam.  As a result of the pulmonary      embolism diagnosed by CT scan, the patient was started on IV heparin      and he later was started on Coumadin.  Over the course of the patient's      hospitalization, his chest pain and his shortness of breath have      essentially improved.  The patient has not complained of any shortness      of breath throughout the course of his hospitalization and as of today,      October 2, the patient indicates that he feels much better and denies      having any current chest pain.  2. Rectal bleeding.  On the morning of September 30, the patient indicated  that he had a bowel movement and there was a large amount of bright red      blood present.  He indicated that he had a history of hemorrhoids and      that in the past there would be some scant blood production after      having bowel movements but this was much more blood than during his      typical bowel movements.  Gastroenterology was consulted.  Dr. Leone Payor      saw the patient on October 1.  Dr. Lina Sar performed a flexible      sigmoidoscopy.  Her final impression was that from the descending colon      to the sigmoid colon no definite diverticula were seen.  The mucosa      appeared to be normal.  No blood was seen in the lumen, only green      stool.  The splenic flexure appeared to be normal.  A polyp was noted      in the sigmoid colon.  The maximal size was 3 mm.  It was described as      diminutive, sessile, approximately 15 cm from the anus.  It was not      removed.  The patient was also noted to have had multiple diminutive      rectosigmoid polyps noted.  Hemorrhoids were also seen, both internal      and external.  They was noted to be a grade 1 bleeding with regards to      the patient's hemorrhoids.  Dr. Juanda Chance went on to state that there was      stigmata of  recent bleeding but not actually bleeding at the moment.      The external hemorrhoid showed no bleeding.  The patient's bleeding was      attributed to his hemorrhoids.  The patient only complained of that one      episode of bleeding on September 30.  Since that time, he has not      noticed any bright red blood present in his stool.  The patient's      heparin was held only momentarily on September 30; however, it was      restarted on October 1.  Therefore, the patient has completed just      under 5 days of IV heparin.  His hemoglobin has remained stable.  It      was noted on September 30 to have declined slightly to 11.5.  However,      as of today, October 2, it has gone up to 12.1.  therefore, the issue      with regards to rectal bleeding appears to have resolved.  3. Anemia.  This has been monitored closely over the course of the      hospitalization.   CONDITION AT THE TIME OF DISCHARGE:  Currently the patient states that he  feels good.  He denies having any chest pain or shortness of breath.  He  looks very comfortable.  He is in no obvious distress.  VITALS:  His  temperature is 97.4, heart rate 67, respirations 17, blood pressure 120/66.  O2 saturation is 98% on 2 liters.  His labs:  His white blood cell count is  7, hemoglobin 12.1, hematocrit 34.9, platelets 241.  PT 26.1, INR 2.3, PTT  193.  His sodium is 138, potassium 4, chloride 105, CO2 26, BUN 11,  creatinine 0.9, glucose 105, calcium 9.1.  The decision has been made to  discharge the patient home.   The patient will be discharged home on the following medications.  1. Coumadin.  He will be instructed to take 2.5 mg p.o. daily.  2. Xanax 0.5 mg p.o. b.i.d.  3. Avapro 150 mg p.o. daily.  4. Effexor 75 mg p.o. daily.  5. Robaxin 500 mg p.o. daily.  6. Protonix 40 mg p.o. daily.  7. Ultram 75 mg q.i.d.  I have called Dr. Rise Mu Mazzocchi's office to inform them that the  patient will be discharged and to  arrange for a follow-up appointment.  The  patient has been scheduled to follow up with Dr. Smith Mince to have his INR  checked on Thursday, April 06, 2006 at 10:15 a.m.      Aaron [name], M.D.  Electronically Signed     OR/MEDQ  D:  04/04/2006  T:  04/04/2006  Job:  [id number]   cc:   Talmadge Coventry, M.D.  Fax: [phone]

## 2010-11-19 NOTE — Consult Note (Signed)
Aaron [name], OSOWSKI NO.:  [id number]   MEDICAL RECORD NO.:  [id number]          PATIENT TYPE:  INP   LOCATION:  6734                         FACILITY:  [facility]   PHYSICIAN:  Iva Boop, MD,FACGDATE OF BIRTH:  19-May-1946   DATE OF CONSULTATION:  DATE OF DISCHARGE:                                   CONSULTATION   REASON FOR CONSULTATION:  Rectal bleeding.   ASSESSMENT:  A 65 year old white man who has had intermittent bright red  blood per rectum on the toilet paper for several days, and then had blood  with a brown stool today.  The blood was bright red.   On physical exam he has some protruding hemorrhoids and brown stool.  When  toilet tissue is used to wipe the anal area, it comes back bloody.  He had a  colonoscopy last on 04/16/04, that demonstrated internal hemorrhoids,  diverticulosis, and 4 diminutive colon polyps.  He previously had a  colonoscopy showing similar findings with 14 polyps that were diminutive,  diverticulosis, and internal hemorrhoids.   I think he is having hemorrhoidal bleeding exacerbated by heparin that was  being used for treatment of a pulmonary embolism.   RECOMMENDATIONS AND PLAN:  1. Serial hemoglobin.  2. The heparin is held for the time being.  3. Plan for a flexible sigmoidoscopy tomorrow to investigate, as I think      this is anorectal bleeding.  4. Treat hemorrhoids.  5. Further plans pending that.   HISTORY:  This is a pleasant 65 year old white man that presented at the  hospital with chest pain and was found to have bilateral pulmonary emboli on  CT scan the day of admission, 03/31/06.  He has been a little constipated  lately having undergone pain medication therapy with morphine and back  injections for back pain through Dr. Ethelene [name].  With the constipation, he has  seen some bright red blood off and on over the past 3 weeks.  There has been  no melena, no real hematochezia.  He was brought to the hospital and  placed  on heparin, and he had the event as described above today.  He takes  Protonix for reflux disease with good control.  There is no abdominal pain,  diarrhea, nausea, or vomiting.   MEDICATIONS IN THE HOSPITAL:  1. Xanax 0.5 mg twice daily.  2. Vytorin 1 tab every evening.  3. Heparin infusion has been held.  4. Hydrochlorothiazide 12.5 mg daily.  5. Avapro 150 mg daily.  6. Robaxin 500 mg daily.  7. Protonix 40 mg daily.  8. Metamucil 1 packet twice daily.  9. Senokot 1 tablet at bedtime.  10.Tramadol 75 mg 4 times a day.  11.Effexor 75 mg daily.  12.Coumadin has been initiated.  13.Morphine p.r.n. pain IV.   DRUG ALLERGIES:  Amoxicillin.   HOME MEDICATIONS:  1. Vytorin.  2. Antara for cholesterol.  3. Celebrex.  4. Protonix.  5. Effexor.  6. Alprazolam.  7. Ambien each evening.  8. Robaxin.  9. Tramadol, as well as Ultram ER 300 mg daily.  10.Micardis  40 mg daily.  11.HCTZ 12.5 mg daily.  12.Aspirin 81 mg daily.  13.Cialis p.r.n.  He takes Metamucil, glucosamine, chondroitin, calcium, vitamin C, vitamin E,  fish oil, and multivitamin.   PAST MEDICAL HISTORY:  1. Obesity.  2. Chronic back pain problems.  3. Prior resectomy.  4. Prior carpal tunnel surgery.  5. Arthroscopic knee surgery 12/06.  6. Hernia repair 1984, 1985.  7. He has an eye implant after a traumatic eye injury as a child, and he      is blind in the right eye.  8. Previous coronary artery disease with stenting in 2005 by Dr. Swaz[place].   SOCIAL HISTORY:  He is married for the second time, he has PTSD and  depression symptomatology related to his first wives death and long demise  with cancer.  He does not smoke or drink.  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   Additional medical issue is that of sleep apnea, using CPAP.   REVIEW OF SYSTEMS:  Chronic back pain, as above, he has upper and lower  dentures, he has chronic back pain problems.  He has  some dyspnea problems  associated with pulmonary embolism and had left-sided chest pain with that.  That is somewhat better.  All other systems are negative.   PHYSICAL EXAM:  Reveals an obese, middle-aged white man looking to be in no  acute distress.  He has vital signs showing a blood pressure of 138/78.  Pulse 56.  Respirations 20.  Temperature 98.7.  Pulse ox 98% on 2 liters.  The eyes show no icterus, there is a right implant.  Mouth shows dentures.  Free of oral or posterior pharyngeal lesions.  The neck is supple without  mass or thyromegaly.  CHEST:  Clear, resonant.  Chest wall nontender.  HEART:  S1, S2.  No rubs, murmurs, or gallops.  The abdomen is obese, soft,  and nontender.  Without organomegaly or mass.  Rectal exam shows brown  stool.  There are protruding hemorrhoids.  When I wipe the anal area, red  blood is seen.  He is mildly tender on rectal exam.  The prostate is without  nodule.  Lower extremities show no peripheral edema.  The skin is warm and  dry without acute rash.  LYMPH NODES:  No neck are supraclavicular nodes  palpated.  The patient is alert and oriented x3.  He has an appropriate  affect.   LABORATORY DATA:  Today's BMET  shows to be normal with a glucose of 101,  creatinine 0.9, BUN 15.  His PTT this morning was greater than 200.  His CBC  showed a hemoglobin of 11.5, hematocrit 32% today.  On the 29th it was 11.6,  hematocrit 33%.  MCV is 88.  The day of admission his hemoglobin was 12.3,  hematocrit 36%, white count 7.6, platelets 228.  His D-dimer was high at  1.21.  A prothrombin 3 level was 97%, normal activity.  He has had negative  cardiac enzymes, except for one slight elevation of the CKB at 4.1, but a  normal total CPK.   Hemoccult negative on 09/29.   EKG was normal on 09/29.   I appreciate the opportunity to care for this patient.      Iva Boop, MD,FACG Electronically Signed     CEG/MEDQ  D:  04/02/2006  T:  04/02/2006   Job:  [id number]   cc:   Peter M. Swaziland, M.D.  Richard D. Aaron [name], M.D.

## 2010-11-19 NOTE — Procedures (Signed)
NAME:  Aaron [name], Aaron [name]                ACCOUNT NO.:  [id number]   MEDICAL RECORD NO.:  [id number]          PATIENT TYPE:  OUT   LOCATION:  SLEEP CENTER                 FACILITY:  [facility]   PHYSICIAN:  Clinton D. Maple Hudson, M.D. DATE OF BIRTH:  [birth year]   DATE OF STUDY:  06/02/2004                              NOCTURNAL POLYSOMNOGRAM   REFERRING PHYSICIAN:  Sharlot Gowda, M.D.   INDICATION FOR STUDY AND HISTORY:  Hypersomnia with sleep apnea.  CPAP  titration requested.  Diagnostic NPSG on May 15, 2004, had recorded and  RDI of 20 per hour.   Epworth sleepiness score 21/24,  BMI 38.4, weight 245 pounds.   The patient takes several medications which may have sedating effect.   SLEEP ARCHITECTURE:  Total sleep time 304 minutes with sleep efficiency of  82%.  Stage I was 39%; stage II, 47%; Stages III and IV were absent.  REM  was 14% of total sleep time.  Latency to sleep onset 8.5 minutes, latency to  REM 92 minutes.  Awake after sleep onset 57 minutes.  Arousal index 31.5.   RESPIRATORY DATA:  CPAP titration protocol.  CPAP was titrated to 9 CWP,  RDI 2.6 per hour using his home head gear ([place] deep nasal mask).   OXYGEN DATA:  No snoring on final CPAP pressure.  Oxygen desaturated to 88%.  With CPAP control, saturation held 95 to 98% on room air.   CARDIAC DATA:  Normal cardiac rhythm.   MOVEMENT AND PARASOMNIA:  A total of 81 limb jerks were recorded of which 12  were associated with arousal or awakening for a periodic limb movement with  arousal index of 2.4 per hour which is mildly increased.   IMPRESSION AND RECOMMENDATIONS:  1.  CPAP titration to 9 CWP, RDI 2.6 per hour with normal oxygenation using      his home head gear ([place] deep nasal mask).  2.  Mild periodic limb movement with arousal.  3.  Initial diagnostic NPSG on May 15, 2002, had recorded an RDI of 20      per hour.   Clinton D. Maple Hudson, M.D.  Diplomate, Biomedical engineer of Sleep  Medicine                                                           Clinton D. Maple Hudson, M.D.  Diplomate, [employer] Board   CDY/MEDQ  D:  06/06/2004 14:29:27  T:  06/07/2004 13:21:30  Job:  [id number]   cc:   Sharlot Gowda, M.D.  [address]  [place], [place] [address]  Fax: [phone]

## 2011-06-08 ENCOUNTER — Other Ambulatory Visit: Payer: Self-pay

## 2011-06-08 ENCOUNTER — Emergency Department (HOSPITAL_COMMUNITY): Payer: Medicare Other

## 2011-06-08 ENCOUNTER — Encounter (HOSPITAL_COMMUNITY): Payer: Self-pay | Admitting: Emergency Medicine

## 2011-06-08 ENCOUNTER — Inpatient Hospital Stay (HOSPITAL_COMMUNITY)
Admission: EM | Admit: 2011-06-08 | Discharge: 2011-06-12 | DRG: 176 | Disposition: A | Discharge status: Home / Self Care | Payer: Medicare Other | Attending: Cardiology | Admitting: Cardiology

## 2011-06-08 DIAGNOSIS — R0602 Shortness of breath: Secondary | ICD-10-CM

## 2011-06-08 DIAGNOSIS — K219 Gastro-esophageal reflux disease without esophagitis: Secondary | ICD-10-CM | POA: Diagnosis present

## 2011-06-08 DIAGNOSIS — D649 Anemia, unspecified: Secondary | ICD-10-CM | POA: Diagnosis present

## 2011-06-08 DIAGNOSIS — F3289 Other specified depressive episodes: Secondary | ICD-10-CM | POA: Diagnosis present

## 2011-06-08 DIAGNOSIS — Z970 Presence of artificial eye: Secondary | ICD-10-CM

## 2011-06-08 DIAGNOSIS — IMO0001 Reserved for inherently not codable concepts without codable children: Secondary | ICD-10-CM

## 2011-06-08 DIAGNOSIS — R0902 Hypoxemia: Secondary | ICD-10-CM | POA: Diagnosis present

## 2011-06-08 DIAGNOSIS — I2699 Other pulmonary embolism without acute cor pulmonale: Secondary | ICD-10-CM

## 2011-06-08 DIAGNOSIS — Z87891 Personal history of nicotine dependence: Secondary | ICD-10-CM

## 2011-06-08 DIAGNOSIS — I2510 Atherosclerotic heart disease of native coronary artery without angina pectoris: Secondary | ICD-10-CM | POA: Diagnosis present

## 2011-06-08 DIAGNOSIS — Z9861 Coronary angioplasty status: Secondary | ICD-10-CM

## 2011-06-08 DIAGNOSIS — F329 Major depressive disorder, single episode, unspecified: Secondary | ICD-10-CM | POA: Diagnosis present

## 2011-06-08 DIAGNOSIS — I10 Essential (primary) hypertension: Secondary | ICD-10-CM | POA: Diagnosis present

## 2011-06-08 DIAGNOSIS — I82409 Acute embolism and thrombosis of unspecified deep veins of unspecified lower extremity: Secondary | ICD-10-CM | POA: Diagnosis present

## 2011-06-08 DIAGNOSIS — Z79899 Other long term (current) drug therapy: Secondary | ICD-10-CM

## 2011-06-08 DIAGNOSIS — Z7982 Long term (current) use of aspirin: Secondary | ICD-10-CM

## 2011-06-08 DIAGNOSIS — G4733 Obstructive sleep apnea (adult) (pediatric): Secondary | ICD-10-CM | POA: Diagnosis present

## 2011-06-08 DIAGNOSIS — E785 Hyperlipidemia, unspecified: Secondary | ICD-10-CM | POA: Diagnosis present

## 2011-06-08 DIAGNOSIS — Z23 Encounter for immunization: Secondary | ICD-10-CM

## 2011-06-08 DIAGNOSIS — R911 Solitary pulmonary nodule: Secondary | ICD-10-CM | POA: Diagnosis present

## 2011-06-08 DIAGNOSIS — E669 Obesity, unspecified: Secondary | ICD-10-CM | POA: Diagnosis present

## 2011-06-08 HISTORY — DX: Cardiac murmur, unspecified: R01.1

## 2011-06-08 HISTORY — DX: Headache: R51

## 2011-06-08 HISTORY — DX: Nausea with vomiting, unspecified: R11.2

## 2011-06-08 HISTORY — DX: Other specified postprocedural states: Z98.890

## 2011-06-08 HISTORY — DX: Encounter for other specified aftercare: Z51.89

## 2011-06-08 HISTORY — DX: Anemia, unspecified: D64.9

## 2011-06-08 HISTORY — DX: Reserved for inherently not codable concepts without codable children: IMO0001

## 2011-06-08 HISTORY — DX: Shortness of breath: R06.02

## 2011-06-08 LAB — CARDIAC PANEL(CRET KIN+CKTOT+MB+TROPI)
CK, MB: 3.6 ng/mL (ref 0.3–4.0)
Relative Index: 2.9 — ABNORMAL HIGH (ref 0.0–2.5)
Relative Index: 3.2 — ABNORMAL HIGH (ref 0.0–2.5)
Total CK: 104 U/L (ref 7–232)
Total CK: 113 U/L (ref 7–232)
Troponin I: <0.3 ng/mL (ref <0.30)
Troponin I: <0.3 ng/mL (ref <0.30)

## 2011-06-08 LAB — CBC
HCT: 37.3 % — ABNORMAL LOW (ref 39.0–52.0)
Hemoglobin: 12.8 g/dL — ABNORMAL LOW (ref 13.0–17.0)
MCH: 30 pg (ref 26.0–34.0)
MCH: 30.4 pg (ref 26.0–34.0)
MCV: 85.9 fL (ref 78.0–100.0)
Platelets: 211 10*3/uL (ref 150–400)
RBC: 4.27 MIL/uL (ref 4.22–5.81)
RDW: 13.1 % (ref 11.5–15.5)
WBC: 9.1 10*3/uL (ref 4.0–10.5)

## 2011-06-08 LAB — DIFFERENTIAL
Lymphocytes Relative: 20 % (ref 12–46)
Lymphs Abs: 1.8 10*3/uL (ref 0.7–4.0)
Monocytes Relative: 10 % (ref 3–12)
Neutro Abs: 6.2 10*3/uL (ref 1.7–7.7)
Neutrophils Relative %: 68 % (ref 43–77)

## 2011-06-08 LAB — PROTIME-INR
INR: 1.07 (ref 0.00–1.49)
Prothrombin Time: 14.1 seconds (ref 11.6–15.2)

## 2011-06-08 LAB — TROPONIN I: Troponin I: <0.3 ng/mL (ref <0.30)

## 2011-06-08 LAB — BASIC METABOLIC PANEL
GFR calc non Af Amer: 69 mL/min — ABNORMAL LOW (ref >90)
Glucose, Bld: 101 mg/dL — ABNORMAL HIGH (ref 70–99)
Potassium: 3.8 mEq/L (ref 3.5–5.1)
Sodium: 136 mEq/L (ref 135–145)

## 2011-06-08 LAB — PRO B NATRIURETIC PEPTIDE: Pro B Natriuretic peptide (BNP): 2074 pg/mL — ABNORMAL HIGH (ref 0–125)

## 2011-06-08 LAB — APTT: aPTT: 32 seconds (ref 24–37)

## 2011-06-08 MED ORDER — POLYETHYLENE GLYCOL 3350 17 GM/SCOOP PO POWD
17.0000 g | Freq: Every day | ORAL | Status: DC
  Filled 2011-06-08: qty 255

## 2011-06-08 MED ORDER — VENLAFAXINE HCL ER 37.5 MG PO CP24
37.5000 mg | ORAL_CAPSULE | Freq: Two times a day (BID) | ORAL | Status: DC
  Administered 2011-06-08 – 2011-06-12 (×8): 37.5 mg via ORAL
  Filled 2011-06-08 (×9): qty 1

## 2011-06-08 MED ORDER — HEPARIN SOD (PORCINE) IN D5W 100 UNIT/ML IV SOLN
1750.0000 [IU]/h | INTRAVENOUS | Status: DC
  Administered 2011-06-08: 1550 [IU]/h via INTRAVENOUS
  Administered 2011-06-08 – 2011-06-09 (×2): 1750 [IU]/h via INTRAVENOUS
  Filled 2011-06-08 (×3): qty 250

## 2011-06-08 MED ORDER — HYDROCHLOROTHIAZIDE 12.5 MG PO CAPS
12.5000 mg | ORAL_CAPSULE | Freq: Two times a day (BID) | ORAL | Status: DC
  Administered 2011-06-09 – 2011-06-12 (×8): 12.5 mg via ORAL
  Filled 2011-06-08 (×9): qty 1

## 2011-06-08 MED ORDER — POLYETHYLENE GLYCOL 3350 17 G PO PACK
17.0000 g | PACK | Freq: Every day | ORAL | Status: DC
  Administered 2011-06-09 – 2011-06-10 (×2): 17 g via ORAL
  Filled 2011-06-08 (×3): qty 1

## 2011-06-08 MED ORDER — ASPIRIN EC 81 MG PO TBEC
81.0000 mg | DELAYED_RELEASE_TABLET | Freq: Every day | ORAL | Status: DC
  Administered 2011-06-09 – 2011-06-12 (×4): 81 mg via ORAL
  Filled 2011-06-08 (×4): qty 1

## 2011-06-08 MED ORDER — HEPARIN BOLUS VIA INFUSION
2000.0000 [IU] | Freq: Once | INTRAVENOUS | Status: AC
  Administered 2011-06-08: 2000 [IU] via INTRAVENOUS
  Filled 2011-06-08: qty 2000

## 2011-06-08 MED ORDER — GLUCOSAMINE-CHONDROITIN 750-600 MG PO TABS
1.0000 | ORAL_TABLET | Freq: Every day | ORAL | Status: DC
Start: 2011-06-08 — End: 2011-06-08

## 2011-06-08 MED ORDER — ASPIRIN 81 MG PO TABS: 81.0000 mg | ORAL_TABLET | Freq: Every evening | ORAL | Status: DC

## 2011-06-08 MED ORDER — HEPARIN BOLUS VIA INFUSION
4000.0000 [IU] | Freq: Once | INTRAVENOUS | Status: AC
  Administered 2011-06-08: 4000 [IU] via INTRAVENOUS
  Filled 2011-06-08: qty 4000

## 2011-06-08 MED ORDER — ALPRAZOLAM 0.25 MG PO TABS: 0.2500 mg | ORAL_TABLET | Freq: Two times a day (BID) | ORAL | Status: DC | PRN

## 2011-06-08 MED ORDER — PANTOPRAZOLE SODIUM 40 MG PO TBEC
40.0000 mg | DELAYED_RELEASE_TABLET | Freq: Every day | ORAL | Status: DC
  Administered 2011-06-09 – 2011-06-12 (×5): 40 mg via ORAL
  Filled 2011-06-08 (×6): qty 1

## 2011-06-08 MED ORDER — VITAMIN D3 25 MCG (1000 UT) PO CAPS: 2.0000 | ORAL_CAPSULE | Freq: Every day | ORAL | Status: DC

## 2011-06-08 MED ORDER — TRAMADOL HCL 50 MG PO TABS
50.0000 mg | ORAL_TABLET | Freq: Four times a day (QID) | ORAL | Status: DC | PRN
  Administered 2011-06-08 – 2011-06-09 (×2): 50 mg via ORAL
  Filled 2011-06-08 (×2): qty 1

## 2011-06-08 MED ORDER — ONDANSETRON HCL 4 MG/2ML IJ SOLN: 4.0000 mg | Freq: Four times a day (QID) | INTRAMUSCULAR | Status: DC | PRN

## 2011-06-08 MED ORDER — SODIUM CHLORIDE 0.9 % IV SOLN
250.0000 mL | INTRAVENOUS | Status: DC | PRN
  Administered 2011-06-10: 1000 mL via INTRAVENOUS

## 2011-06-08 MED ORDER — PSYLLIUM 95 % PO PACK
1.0000 | PACK | Freq: Every day | ORAL | Status: DC
  Administered 2011-06-09: 1 via ORAL
  Filled 2011-06-08 (×3): qty 1

## 2011-06-08 MED ORDER — SIMVASTATIN 40 MG PO TABS
40.0000 mg | ORAL_TABLET | Freq: Every day | ORAL | Status: DC
  Administered 2011-06-09 – 2011-06-11 (×5): 40 mg via ORAL
  Filled 2011-06-08 (×6): qty 1

## 2011-06-08 MED ORDER — LISINOPRIL 20 MG PO TABS
20.0000 mg | ORAL_TABLET | Freq: Two times a day (BID) | ORAL | Status: DC
  Administered 2011-06-09 – 2011-06-12 (×8): 20 mg via ORAL
  Filled 2011-06-08 (×9): qty 1

## 2011-06-08 MED ORDER — HEPARIN BOLUS VIA INFUSION: 4000.0000 [IU] | Freq: Once | INTRAVENOUS | Status: DC

## 2011-06-08 MED ORDER — ZOLPIDEM TARTRATE 5 MG PO TABS: 5.0000 mg | ORAL_TABLET | Freq: Every evening | ORAL | Status: DC | PRN

## 2011-06-08 MED ORDER — VITAMIN D3 25 MCG (1000 UNIT) PO TABS
2000.0000 [IU] | ORAL_TABLET | Freq: Every day | ORAL | Status: DC
  Administered 2011-06-09 – 2011-06-12 (×5): 2000 [IU] via ORAL
  Filled 2011-06-08 (×5): qty 2

## 2011-06-08 MED ORDER — IOHEXOL 300 MG/ML  SOLN
120.0000 mL | Freq: Once | INTRAMUSCULAR | Status: AC | PRN
  Administered 2011-06-08: 120 mL via INTRAVENOUS

## 2011-06-08 MED ORDER — ASPIRIN 81 MG PO CHEW
324.0000 mg | CHEWABLE_TABLET | Freq: Once | ORAL | Status: DC
  Filled 2011-06-08: qty 4

## 2011-06-08 MED ORDER — SODIUM CHLORIDE 0.9 % IJ SOLN
3.0000 mL | Freq: Two times a day (BID) | INTRAMUSCULAR | Status: DC
  Administered 2011-06-09 – 2011-06-12 (×6): 3 mL via INTRAVENOUS

## 2011-06-08 MED ORDER — WARFARIN VIDEO: Freq: Once | Status: DC

## 2011-06-08 MED ORDER — HEPARIN SOD (PORCINE) IN D5W 100 UNIT/ML IV SOLN: 1000.0000 [IU]/h | Freq: Once | INTRAVENOUS | Status: DC

## 2011-06-08 MED ORDER — ASPIRIN 81 MG PO CHEW: 324.0000 mg | CHEWABLE_TABLET | ORAL | Status: DC

## 2011-06-08 MED ORDER — DEXTROMETHORPHAN POLISTIREX 30 MG/5ML PO LQCR
30.0000 mg | Freq: Two times a day (BID) | ORAL | Status: DC
  Administered 2011-06-09: 30 mg via ORAL
  Filled 2011-06-08 (×4): qty 5

## 2011-06-08 MED ORDER — NITROGLYCERIN 0.4 MG SL SUBL
0.4000 mg | SUBLINGUAL_TABLET | SUBLINGUAL | Status: DC | PRN
  Administered 2011-06-08: 0.4 mg via SUBLINGUAL
  Filled 2011-06-08: qty 25

## 2011-06-08 MED ORDER — PATIENT'S GUIDE TO USING COUMADIN BOOK
Freq: Once | Status: AC
  Administered 2011-06-09
  Filled 2011-06-08: qty 1

## 2011-06-08 MED ORDER — MELOXICAM 15 MG PO TABS
15.0000 mg | ORAL_TABLET | Freq: Every day | ORAL | Status: DC
  Administered 2011-06-09 – 2011-06-12 (×5): 15 mg via ORAL
  Filled 2011-06-08 (×5): qty 1

## 2011-06-08 MED ORDER — ALPRAZOLAM 0.5 MG PO TABS
1.0000 mg | ORAL_TABLET | Freq: Every evening | ORAL | Status: DC | PRN
  Administered 2011-06-09 – 2011-06-11 (×3): 1 mg via ORAL
  Filled 2011-06-08 (×3): qty 2

## 2011-06-08 MED ORDER — PNEUMOCOCCAL VAC POLYVALENT 25 MCG/0.5ML IJ INJ
0.5000 mL | INJECTION | INTRAMUSCULAR | Status: AC
  Administered 2011-06-09: 0.5 mL via INTRAMUSCULAR
  Filled 2011-06-08: qty 0.5

## 2011-06-08 MED ORDER — ASPIRIN EC 81 MG PO TBEC
81.0000 mg | DELAYED_RELEASE_TABLET | Freq: Every day | ORAL | Status: DC
  Administered 2011-06-09: 81 mg via ORAL
  Filled 2011-06-08 (×2): qty 1

## 2011-06-08 MED ORDER — WARFARIN SODIUM 7.5 MG PO TABS
7.5000 mg | ORAL_TABLET | Freq: Once | ORAL | Status: AC
  Administered 2011-06-09: 7.5 mg via ORAL
  Filled 2011-06-08: qty 1

## 2011-06-08 MED ORDER — ACETAMINOPHEN 325 MG PO TABS: 650.0000 mg | ORAL_TABLET | ORAL | Status: DC | PRN

## 2011-06-08 MED ORDER — DOCUSATE SODIUM 100 MG PO CAPS
100.0000 mg | ORAL_CAPSULE | ORAL | Status: DC
  Administered 2011-06-09: 100 mg via ORAL
  Filled 2011-06-08 (×2): qty 1

## 2011-06-08 MED ORDER — LISINOPRIL-HYDROCHLOROTHIAZIDE 20-12.5 MG PO TABS: 1.0000 | ORAL_TABLET | Freq: Two times a day (BID) | ORAL | Status: DC

## 2011-06-08 MED ORDER — SODIUM CHLORIDE 0.9 % IJ SOLN: 3.0000 mL | INTRAMUSCULAR | Status: DC | PRN

## 2011-06-08 NOTE — ED Notes (Signed)
EMS-pt fell a week ago on his cell phone bruising the left side of his chest. Pt reports last couple days he has had productive cough, yellow, with sob. Reports left chest wall discomfort with cough. Went to doctors office and ekg was done, reports inverted t waves which is different from last ekg. O2 sats were low 90s on RA.

## 2011-06-08 NOTE — Progress Notes (Signed)
ANTICOAGULATION CONSULT NOTE - Follow Up Consult  Pharmacy Consult for heparin Indication: Bilateral PE  Allergies  Allergen Reactions  . Amoxicillin     Rapid heart rate.     Patient Measurements: Height: 5\' 7"  (170.2 cm) Weight: 238 lb 12.1 oz (108.3 kg) IBW/kg (Calculated) : 66.1  Adjusted Body Weight:   Vital Signs: Temp: 98.1 F (36.7 C) (12/05 2147) Temp src: Oral (12/05 1805) BP: 143/82 mmHg (12/05 2147) Pulse Rate: 78  (12/05 2147)  Labs:  Basename 06/08/11 2057 06/08/11 1913 06/08/11 1422 06/08/11 1242 06/08/11 1141  HGB 13.2 -- -- -- 12.8*  HCT 37.3* -- -- -- 36.1*  PLT 211 -- -- -- 207  APTT -- -- -- -- 32  LABPROT -- -- -- -- 14.1  INR -- -- -- -- 1.07  HEPARINUNFRC 0.14* -- -- -- --  CREATININE -- -- -- -- 1.10  CKTOTAL -- 113 104 104 --  CKMB -- 3.6 3.0 2.8 --  TROPONINI -- <0.30 <0.30 <0.30 --   Estimated Creatinine Clearance: 78.6 ml/min (by C-G formula based on Cr of 1.1).   Medications:  Scheduled:    . aspirin  324 mg Oral NOW  . aspirin EC  81 mg Oral Daily  . aspirin EC  81 mg Oral Daily  . cholecalciferol  2,000 Units Oral Daily  . dextromethorphan  30 mg Oral BID  . docusate sodium  100 mg Oral 2 times weekly  . heparin  2,000 Units Intravenous Once  . heparin  4,000 Units Intravenous Once  . hydrochlorothiazide  12.5 mg Oral BID  . lisinopril  20 mg Oral BID  . meloxicam  15 mg Oral Daily  . pantoprazole  40 mg Oral Q1200  . patient's guide to using coumadin book   Does not apply Once  . polyethylene glycol  17 g Oral Daily  . psyllium  1 packet Oral Daily  . simvastatin  40 mg Oral QHS  . sodium chloride  3 mL Intravenous Q12H  . venlafaxine  37.5 mg Oral BID  . warfarin  7.5 mg Oral Once  . warfarin   Does not apply Once  . DISCONTD: aspirin  324 mg Oral Once  . DISCONTD: aspirin  81 mg Oral QPM  . DISCONTD: Glucosamine-Chondroitin  1 tablet Oral Daily  . DISCONTD: heparin  1,000 Units/hr Intravenous Once  . DISCONTD:  heparin  4,000 Units Intravenous Once  . DISCONTD: lisinopril-hydrochlorothiazide  1 tablet Oral BID  . DISCONTD: polyethylene glycol powder  17 g Oral Daily  . DISCONTD: Vitamin D3  2 capsule Oral Daily    Assessment: Heparin level was subtherapeutic at 0.14 Goal of Therapy:  Heparin level 0.3-0.7 units/ml   Plan:  Will give 2000 unit heparin bolus and increase drip to 1750 units/hr. Will f/u heparin level in AM.  Eugene Garnet 06/08/2011,10:03 PM

## 2011-06-08 NOTE — ED Provider Notes (Cosign Needed Addendum)
Patient relates he sees Dr. [place] and had a cardiac stent placed in August 2005. He states at that time he was having shortness of breath. He states 6 days ago he noted when he tried to break wheeze he got very dyspneic and fatigued. Which has gotten progressively worse. He also states he fell onto a phone  onto his left chest he's been having some left-sided chest pain when he coughs.  Patients alert and cooperative. 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  Plan will be to  talk to cardiology about further evaluation  Medical screening examination/treatment/procedure(s) were conducted as a shared visit with non-physician practitioner(s) and myself.  I personally evaluated the patient during the encounter Devoria Albe, MD, Franz Dell, MD 06/08/11 1225    16:20 Radiology called CT angio results--bilateral PE, will start heparin and let cardiology know.  17:09 Dr Swaziland aware of bilateral PE  Ward Givens, MD 06/08/11 1710

## 2011-06-08 NOTE — Progress Notes (Signed)
ANTICOAGULATION CONSULT NOTE - Initial Consult  Pharmacy Consult for Coumadin Indication: pulmonary embolus  Allergies  Allergen Reactions  . Amoxicillin     Rapid heart rate.     Patient Measurements: Height: 5\' 7"  (170.2 cm) Weight: 238 lb 12.1 oz (108.3 kg) IBW/kg (Calculated) : 66.1  Adjusted Body Weight:   Vital Signs: Temp: 97.6 F (36.4 C) (12/05 1805) Temp src: Oral (12/05 1805) BP: 136/80 mmHg (12/05 1805) Pulse Rate: 80  (12/05 1805)  Labs:  Basename 06/08/11 1913 06/08/11 1422 06/08/11 1242 06/08/11 1141  HGB -- -- -- 12.8*  HCT -- -- -- 36.1*  PLT -- -- -- 207  APTT -- -- -- 32  LABPROT -- -- -- 14.1  INR -- -- -- 1.07  HEPARINUNFRC -- -- -- --  CREATININE -- -- -- 1.10  CKTOTAL 113 104 104 --  CKMB 3.6 3.0 2.8 --  TROPONINI <0.30 <0.30 <0.30 --   Estimated Creatinine Clearance: 78.6 ml/min (by C-G formula based on Cr of 1.1).  Medical History: Past Medical History  Diagnosis Date  . Coronary artery disease     DES LAD 2005  . Hypertension   . Hyperlipidemia   . Depression   . GERD (gastroesophageal reflux disease)   . Insomnia   . Sleep apnea, obstructive   . Pulmonary embolism   . Osteoarthritis     Medications:  65 yr old male with bilateral PE. He was started on heparin earlier today, now to start coumadin.  Assessment: Pt with bilateral PE to be treated with heparin until INR is therapeutic. Pt has been on coumadin previously for a prior PE. Goal of Therapy:  INR 2-3   Plan:  Will give 7.5 mg Coumadin today. Ordered coumadin booklet and video for pt. Daily PT/INR.   Eugene Garnet 06/08/2011,8:57 PM

## 2011-06-08 NOTE — ED Notes (Signed)
Discontinued asa , pt given 324 PTA

## 2011-06-08 NOTE — Progress Notes (Addendum)
ANTICOAGULATION CONSULT NOTE - Initial Consult  Pharmacy Consult for Heparin Indication: r/o PE  Allergies  Allergen Reactions  . Amoxicillin     Rapid heart rate.     Patient Measurements: Height: 5\' 7"  (170.2 cm) Weight: 239 lb (108.41 kg) IBW/kg (Calculated) : 66.1  Heparin Dosing Weight: 90.4 kg  Vital Signs: Temp: 97.5 F (36.4 C) (12/05 1335) Temp src: Oral (12/05 1335) BP: 121/61 mmHg (12/05 1335) Pulse Rate: 69  (12/05 1335)  Labs:  Basename 06/08/11 1242 06/08/11 1144 06/08/11 1141  HGB -- -- 12.8*  HCT -- -- 36.1*  PLT -- -- 207  APTT -- -- 32  LABPROT -- -- 14.1  INR -- -- 1.07  HEPARINUNFRC -- -- --  CREATININE -- -- 1.10  CKTOTAL 104 -- --  CKMB 2.8 -- --  TROPONINI <0.30 <0.30 --   Estimated Creatinine Clearance: 78.6 ml/min (by C-G formula based on Cr of 1.1).  Medical History: Past Medical History  Diagnosis Date  . Coronary artery disease     DES LAD 2005  . Hypertension   . Hyperlipidemia   . Depression   . GERD (gastroesophageal reflux disease)   . Insomnia   . Sleep apnea, obstructive   . Pulmonary embolism   . Osteoarthritis     Medications:   (Not in a hospital admission)   Assessment: 65 y.o. M to start heparin for ?pulmonary embolus. Wt: 108.4 kg (Heparin dosing wt: 90.4 kg), SCr 1.1, CrCl~70 ml/min. No recent invasive surgeries, no history of stroke, on no anticoagulation agents PTA.   Goal of Therapy:  Heparin level 0.3-0.7 units/ml   Plan:  1. Heparin bolus of 4000 units x 1 2. Initiate heparin drip at 1550 units/hr (15.5 ml/hr) 3. Will continue to monitor for any signs/symptoms of bleeding and will follow up with heparin level in 6 hours   Rolley Sims 06/08/2011,1:54 PM

## 2011-06-08 NOTE — H&P (Signed)
Patient ID: JONAS GOH MRN: [id number], DOB/AGE: 1946-02-21 65 y.o. Date of Encounter: 06/08/2011  Primary Physician: Angelica Chessman., MD, MD Primary Cardiologist: PJ  Chief Complaint:   HPI: Mr. Harkin is a 65 year old male with a history of coronary artery disease. He also has a remote history of PE. Over the last week or so, he has noted steadily increasing dyspnea on exertion. At first he would notice it only by working outside for a long period of time. Then, he became short of breath walking on flat ground for approximately 100 feet. By last p.m., he was short of breath walking room to room. Today, he woke with shortness of breath at rest. Starting last p.m., he has had some substernal chest pain he describes as a tightness at less than a 5/10. His pre-PCI symptom was shortness of breath and so he has not had this before. He has not taken any medications for this. Of note, he fell about a week ago and had an injury to his lower left ribs. After that, he noted soreness in that area and difficulty with deep inspiration. He also gets significant pain with cough. The chest wall tenderness has improved but he still has significant pain with cough. His shortness of breath is improved slightly on O2. He also describes orthopnea but denies PND. He has not had significant lower extremity edema. Currently in the emergency room, he gets short of breath with conversation despite being on oxygen.  Past Medical History  Diagnosis Date  . Coronary artery disease     DES LAD 2005  . Hypertension   . Hyperlipidemia   . Depression   . GERD (gastroesophageal reflux disease)   . Insomnia   . Sleep apnea, obstructive   . Pulmonary embolism   . Osteoarthritis    Obesity              Surgical History:  Past Surgical History  Procedure Date  . Spinal injections     x9 for back  . Artificial right eye   . Hernia repair     bilateral  . Carpel tunnel release    Knee surgery    Prior resectomy     . Coronary stent placement      Current facility-administered medications:nitroGLYCERIN (NITROSTAT) SL tablet 0.4 mg, 0.4 mg, Sublingual, Q5 Min x 3 PRN, Ginette Pitman Pitylak, PA, 0.4 mg at 06/08/11 1140;  DISCONTD: aspirin chewable tablet 324 mg, 324 mg, Oral, Once, Demetrius Charity, PA  Current outpatient prescriptions: ALPRAZolam (XANAX) 0.5 MG tablet, Take 1 mg by mouth at bedtime as needed. As needed for sleep., Ascorbic Acid (VITAMIN C) 1000 MG tablet, Take 1,000 mg by mouth daily.  , Disp: , Rfl: ;   aspirin 81 MG chewable tablet, Chew 324 mg by mouth daily.  , Disp: , Rfl: ;  aspirin 81 MG tablet,  Cholecalciferol (VITAMIN D3) 1000 UNITS CAPS, Take 2 capsules by mouth daily. , Disp: , Rfl: ;  docusate sodium (COLACE) 100 MG capsule, Take 100 mg by mouth 2 (two) times a week.   fenofibrate micronized (LOFIBRA) 200 MG capsule, Take 200 mg by mouth daily before breakfast.   Glucosamine-Chondroitin 750-600 MG TABS, Take 1 tablet by mouth daily.  , Disp: , Rfl: ;  lisinopril-hydrochlorothiazide (PRINZIDE,ZESTORETIC) 20-12.5 MG per tablet, Take  2 (two) times   : ;  meloxicam (MOBIC) 15 MG tablet, Take 15 mg by mouth daily. , Disp: , Rfl: ;   Multiple Vitamin (MULTIVITAMIN) tablet,  Take 1 tablet by mouth daily.  , Disp: , Rfl:  Omega-3 Fatty Acids (FISH OIL) 1000 MG CAPS, Take 2 capsules by mouth 4 (four) times a week. ,   polyethylene glycol powder (MIRALAX) powder, Take 17 g by mouth daily.  , Disp: , Rfl: ;   psyllium (METAMUCIL) 58.6 % powder, Take by mouth. 3 caps daily , Disp: , Rfl: ;   simvastatin (ZOCOR) 40 MG tablet, Take 40 mg by mouth at bedtime.  , Disp: , Rfl:  traMADol (ULTRAM) 50 MG tablet, Take 50 mg by mouth every 6 (six) hours as needed.  , Disp: , Rfl: ;  venlafaxine (EFFEXOR-XR) 37.5 MG 24 hr capsule, Take 37.5 mg by mouth 2 (two) times daily.  , Disp: , omeprazole (PRILOSEC) 20 MG capsule, Take 20 mg by mouth daily.  , Disp: , Rfl: ;   OVER THE COUNTER MEDICATION, Take 500 mg  by mouth daily. Calcium Citrate OTC. Patient takes 10 per week.    Allergies  Allergen Reactions  . Amoxicillin     Rapid heart rate.     History   Social History  . Marital Status: Married    Spouse Name: N/A    Number of Children: 3  . Years of Education: N/A   Occupational History  . building maintenance     disabled   Social History Main Topics  . Smoking status: Former Smoker    Types: Cigarettes    Quit date: 07/10/1995  . Smokeless tobacco: Never Used  . Alcohol Use: No  . Drug Use: No  . Sexually Active: Not on file   Social History Narrative  . No narrative on file   Family History: Father died at age 61 of COPD.  Mother died at age 34 of  suicide and also had some type of cardiac condition.  One brother died of  polio and one sister died with cancer.  One brother is alive and well.   Review of Systems: General: negative for chills, fever, night sweats or weight changes.  Cardiovascular: negative for  edema,   palpitations, paroxysmal nocturnal dyspnea   Dermatological: negative for rash Respiratory: negative for  wheezing Urologic: negative for hematuria Abdominal: negative for nausea, vomiting, diarrhea, bright red blood per rectum, melena, or hematemesis Neurologic: negative for visual changes, syncope, or dizziness All other systems reviewed and are otherwise negative except as noted above.  Labs:  Lab Results  Component Value Date   WBC 9.1 06/08/2011   HGB 12.8* 06/08/2011   HCT 36.1* 06/08/2011   MCV 84.5 06/08/2011   PLT 207 06/08/2011   No results found for this basename: NA,K,CL,CO2,BUN,CREATININE,CALCIUM,LABALBU,PROT,BILITOT,ALKPHOS,ALT,AST,GLUCOSE in the last 168 hours No results found for this basename: CKTOTAL:4,CKMB:4,TROPONINI:4 in the last 72 hours No results found for this basename: CHOL, HDL, LDLCALC, TRIG   D-dimer 7.85  Radiology/Studies:  CXR, rib films pending   EKG: SR, no ST  elevation  Physical Exam: Pulse 85, temperature  97.9 F (36.6 C), temperature source Oral, resp. rate 17, SpO2 99.00%. General: Well developed, well nourished, in acute distress secondary to shortness of breath  Head: Normocephalic, atraumatic, sclera non-icteric, no xanthomas, nares are without discharge. Right eye is artificial and that pupil does not react but the left eye moves normally.  Neck: Negative for carotid bruits. JVD elevated some but difficult to assess secondary to body habitus. Lungs: Clear bilaterally to auscultation except for a few basilar rales, without wheezes, or rhonchi. Breathing is slightly labored. Heart: RRR  with S1 S2. No murmurs, rubs, or gallops appreciated. Abdomen: Soft, non-tender, non-distended with normoactive bowel sounds. No hepatomegaly. No rebound/guarding. No obvious abdominal masses. Msk:  Strength and tone appear normal for age. Chest wall has been tender to palpation recently but no point tenderness was found. Extremities: No clubbing or cyanosis. No edema.  Distal pedal pulses are 2+ and equal bilaterally. Neuro: Alert and oriented X 3. Moves all extremities spontaneously. Psych:  Responds to questions appropriately with a normal affect.  ASSESSMENT AND PLAN:  Mr. Mattix is a 65 year old male with a history of coronary artery disease. He is here today with shortness of breath which has progressed rapidly since it began after a fall about a week ago. A chest x-ray with rib films and a d-dimer is significantly elevated at 7.85. His initial point-of-care troponin is less than 0.3. His other labs are within normal limits. We will check a CT angiogram to rule out PE and add heparin to his medication regimen. If the CT is negative he will be admitted and we will cycle cardiac enzymes. Cardiac catheterization can be considered in a.m. if his BUN and creatinine are stable.  Signed, Bjorn Loser Carlton Sweaney PA-C 06/08/2011, 12:17 PM

## 2011-06-08 NOTE — ED Provider Notes (Signed)
See prior note   Ward Givens, MD 06/08/11 1911

## 2011-06-08 NOTE — ED Notes (Signed)
MD at bedside. 

## 2011-06-08 NOTE — ED Notes (Signed)
ekg done and given to dr. Knapp. 

## 2011-06-08 NOTE — H&P (Signed)
Patient seen and examined and history reviewed. Agree with above findings and plan. Patient's history is suggestive of recurrent pulmonary embolus with several day history of increasing dyspnea. Some cough with pleuritic chest pain. D-dimer is elevated. Initial Ecg and cardiac enzymes are negative. No murmur on exam. Lungs clear. Agree with CTA of chest to evaluate for PE. On IV heparin now. If PE confirmed he will need lifelong anticoagulation. If CT is negative would consider cardiac cath in am since his initial presentation prior to PCI of the LAD was atypical.   Thedora Hinders 06/08/2011 3:15 PM

## 2011-06-08 NOTE — ED Provider Notes (Signed)
History     CSN: [id number] Arrival date & time: 06/08/2011 11:12 AM   First MD Initiated Contact with Patient 06/08/11 1113      Chief Complaint  Patient presents with  . Chest Pain  . Cough  . Shortness of Breath   Patient is a 65 y.o. male presenting with chest pain, cough, and shortness of breath.  Chest Pain Primary symptoms include shortness of breath and cough. Pertinent negatives for primary symptoms include no fever, no abdominal pain, no nausea and no vomiting.  Pertinent negatives for associated symptoms include no diaphoresis, no numbness and no weakness.    Cough Associated symptoms include chest pain and shortness of breath. Pertinent negatives include no chills.  Shortness of Breath  Associated symptoms include chest pain, cough and shortness of breath. Pertinent negatives include no fever.   Patient presents to emergency room with complaints of shortness of breath. Patient reports that he's had some exertional shortness of breath or the last week. Patient states that the dyspnea became worse last night. Patient states that he's also had a productive cough. Patient was sent here from his primary care office Dr. Alvera Novel. Patient complains of left-sided chest discomfort after having fallen a week ago. Patient reports that he was working in his yard on Sunday and became fatigued and had increasing shortness of breath. Patient states that shortness of breath is his angina equivalent. Patient had a neck placed in 2005 by Dr. [place]. Last stress test was last year. Patient states that he also has a past medical history significant for pulmonary embolism. Patient reports that he used to be a smoker. Has a 26 pack year history. Patient states that he has chest tightness.   Past Medical History  Diagnosis Date  . Coronary artery disease     DES LAD 2005  . Hypertension   . Hyperlipidemia   . Depression   . GERD (gastroesophageal reflux disease)   . Insomnia   . Sleep apnea,  obstructive   . Pulmonary embolism   . Osteoarthritis     Past Surgical History  Procedure Date  . Spinal injections     x9 for back  . Artificial right eye   . Hernia repair     bilateral  . Carpel tunnel release   . Coronary stent placement     History reviewed. No pertinent family history.  History  Substance Use Topics  . Smoking status: Former Smoker    Types: Cigarettes    Quit date: 07/10/1995  . Smokeless tobacco: Never Used  . Alcohol Use: No      Review of Systems  Constitutional: Negative for fever, chills, diaphoresis and appetite change.  HENT: Negative for neck pain.   Eyes: Negative for photophobia and visual disturbance.  Respiratory: Positive for cough, chest tightness and shortness of breath.   Cardiovascular: Positive for chest pain.  Gastrointestinal: Negative for nausea, vomiting and abdominal pain.  Genitourinary: Negative for flank pain.  Musculoskeletal: Negative for back pain.  Skin: Negative for rash.  Neurological: Negative for weakness and numbness.  All other systems reviewed and are negative.    Allergies  Amoxicillin  Home Medications   Current Outpatient Rx  Name Route Sig Dispense Refill  . ALPRAZOLAM 0.5 MG PO TABS Oral Take 1 mg by mouth at bedtime as needed. As needed for sleep.    Marland Kitchen VITAMIN C 1000 MG PO TABS Oral Take 1,000 mg by mouth daily.      . ASPIRIN 81  MG PO CHEW Oral Chew 324 mg by mouth daily.      . ASPIRIN 81 MG PO TABS Oral Take 81 mg by mouth every evening.     Marland Kitchen VITAMIN D3 1000 UNITS PO CAPS Oral Take 2 capsules by mouth daily.     Marland Kitchen DOCUSATE SODIUM 100 MG PO CAPS Oral Take 100 mg by mouth 2 (two) times a week. 4 times weekly as needed for constipation.     . FENOFIBRATE MICRONIZED 200 MG PO CAPS Oral Take 200 mg by mouth daily before breakfast.      . GLUCOSAMINE-CHONDROITIN 750-600 MG PO TABS Oral Take 1 tablet by mouth daily.      Marland Kitchen LISINOPRIL-HYDROCHLOROTHIAZIDE 20-12.5 MG PO TABS Oral Take 1 tablet by  mouth 2 (two) times daily.      . MELOXICAM 15 MG PO TABS Oral Take 15 mg by mouth daily.     Marland Kitchen ONE-DAILY MULTI VITAMINS PO TABS Oral Take 1 tablet by mouth daily.      Marland Kitchen FISH OIL 1000 MG PO CAPS Oral Take 2 capsules by mouth 4 (four) times a week.     Marland Kitchen POLYETHYLENE GLYCOL 3350 PO POWD Oral Take 17 g by mouth daily.      . PSYLLIUM 58.6 % PO POWD Oral Take by mouth. 3 caps daily     . SIMVASTATIN 40 MG PO TABS Oral Take 40 mg by mouth at bedtime.      . TRAMADOL HCL 50 MG PO TABS Oral Take 50 mg by mouth every 6 (six) hours as needed.      . VENLAFAXINE HCL 37.5 MG PO CP24 Oral Take 37.5 mg by mouth 2 (two) times daily.      Marland Kitchen OMEPRAZOLE 20 MG PO CPDR Oral Take 20 mg by mouth daily.      Marland Kitchen OVER THE COUNTER MEDICATION Oral Take 500 mg by mouth daily. Calcium Citrate OTC. Patient takes 10 per week.       Pulse 85  Temp(Src) 97.9 F (36.6 C) (Oral)  Resp 17  SpO2 99%  Physical Exam  Nursing note and vitals reviewed. Constitutional: He is oriented to person, place, and time. He appears well-developed and well-nourished. No distress.  Neck: Normal range of motion. Neck supple.  Cardiovascular: Normal rate, regular rhythm, normal heart sounds and intact distal pulses.  Exam reveals no gallop and no friction rub.   No murmur heard. Pulmonary/Chest: Effort normal and breath sounds normal. He exhibits tenderness.       Crackles at the bases  Abdominal: Soft. Bowel sounds are normal. He exhibits no distension. There is no tenderness.  Musculoskeletal: Normal range of motion. He exhibits no tenderness.  Neurological: He is alert and oriented to person, place, and time.  Skin: Skin is warm. He is diaphoretic. No erythema. No pallor.  Psychiatric: He has a normal mood and affect. His behavior is normal. Judgment and thought content normal.    ED Course  Procedures (including critical care time)  Patient seen and evaluated.  VSS reviewed. . Nursing notes reviewed. Discussed with and seen with  dr. Lynelle Doctor, attending physician. Initial testing ordered. Will monitor the patient closely. They agree with the treatment plan and diagnosis.   Results for orders placed during the hospital encounter of 06/08/11  BASIC METABOLIC PANEL      Component Value Range   Sodium 136  135 - 145 (mEq/L)   Potassium 3.8  3.5 - 5.1 (mEq/L)   Chloride 104  96 - 112 (mEq/L)   CO2 21  19 - 32 (mEq/L)   Glucose, Bld 101 (*) 70 - 99 (mg/dL)   BUN 21  6 - 23 (mg/dL)   Creatinine, Ser 4.09  0.50 - 1.35 (mg/dL)   Calcium 9.7  8.4 - 81.1 (mg/dL)   GFR calc non Af Amer 69 (*) >90 (mL/min)   GFR calc Af Amer 79 (*) >90 (mL/min)  CBC      Component Value Range   WBC 9.1  4.0 - 10.5 (K/uL)   RBC 4.27  4.22 - 5.81 (MIL/uL)   Hemoglobin 12.8 (*) 13.0 - 17.0 (g/dL)   HCT 91.4 (*) 78.2 - 52.0 (%)   MCV 84.5  78.0 - 100.0 (fL)   MCH 30.0  26.0 - 34.0 (pg)   MCHC 35.5  30.0 - 36.0 (g/dL)   RDW 95.6  21.3 - 08.6 (%)   Platelets 207  150 - 400 (K/uL)  DIFFERENTIAL      Component Value Range   Neutrophils Relative 68  43 - 77 (%)   Neutro Abs 6.2  1.7 - 7.7 (K/uL)   Lymphocytes Relative 20  12 - 46 (%)   Lymphs Abs 1.8  0.7 - 4.0 (K/uL)   Monocytes Relative 10  3 - 12 (%)   Monocytes Absolute 0.9  0.1 - 1.0 (K/uL)   Eosinophils Relative 2  0 - 5 (%)   Eosinophils Absolute 0.2  0.0 - 0.7 (K/uL)   Basophils Relative 0  0 - 1 (%)   Basophils Absolute 0.0  0.0 - 0.1 (K/uL)  PROTIME-INR      Component Value Range   Prothrombin Time 14.1  11.6 - 15.2 (seconds)   INR 1.07  0.00 - 1.49   APTT      Component Value Range   aPTT 32  24 - 37 (seconds)  TROPONIN I      Component Value Range   Troponin I <0.30  <0.30 (ng/mL)  PRO B NATRIURETIC PEPTIDE      Component Value Range   BNP, POC 2074.0 (*) 0 - 125 (pg/mL)  CARDIAC PANEL(CRET KIN+CKTOT+MB+TROPI)      Component Value Range   Total CK 104  7 - 232 (U/L)   CK, MB 2.8  0.3 - 4.0 (ng/mL)   Troponin I <0.30  <0.30 (ng/mL)   Relative Index 2.7 (*) 0.0 - 2.5    D-DIMER, QUANTITATIVE      Component Value Range   D-Dimer, Quant 7.85 (*) 0.00 - 0.48 (ug/mL-FEU)   Dg Chest 2 View  06/08/2011  *RADIOLOGY REPORT*  Clinical Data: Shortness of breath, left chest pain and rib pain, history of blood clots in 2007  CHEST - 2 VIEW  Comparison: 06/07/2007  Findings: Upper-normal size of cardiac silhouette. Calcified tortuous aorta. Pulmonary vascularity normal. Minimal emphysematous and bronchitic changes. No pulmonary infiltrate, pleural effusion, or pneumothorax. Bones appear demineralized without definite acute left rib abnormality. End plate spur formation thoracic spine.  IMPRESSION: Minimal emphysematous and bronchitic changes without acute infiltrate.  Original Report Authenticated By: Lollie Marrow, M.D.   Cardiology consulted at 12 pm. [facility] cardiology saw the patient.    Date: 06/08/2011  Rate: 83  Rhythm: normal sinus rhythm  QRS Axis: normal  Intervals: QT prolonged  ST/T Wave abnormalities: diffuse t-wave inversions, in anterior and inferior leads  Conduction Disutrbances:none  Narrative Interpretation:   Old EKG Reviewed: changes noted, 04/01/2006  1:33 PM Patient seen and re-evaluated. Resting comfortably. VSS  stable. NAD. Patient notified of testing results. Stated agreement and understanding. Patient stated understanding to treatment plan and diagnosis.     MDM  Chest pain SOB         Demetrius Charity, [place] 06/08/11 1336

## 2011-06-08 NOTE — Consult Note (Signed)
Name: Aaron [name] MRN: [id number] DOB: 1945/07/30    LOS: 0  PCCM ADMISSION NOTE  History of Present Illness: 65 y/o M with PMH of CAD s/p DES to LAD, HTN, HLD, OSA, past PE who presented to [facility] ED on 12/5 with a one week history of steadily increasing dyspnea on exertion. Initially was only after long periods of exertion.  12/4 progressed to SOB while walking in his house and chest pressure.  AM of admit endorses shortness of breath at rest.  Indicates he tripped and fell on Thanksgiving day and has confirmed L 7th rib fracture by plain film.  Pain and non-productive cough noted after fall.         Tests / Events: 12/5 CTA CHEST>>>Bilateral pulmonary emboli with significant clot burden. Right ventricular strain pattern is noted. Mild atherosclerotic changes involving the aorta and dense  coronary artery calcifications. Indeterminate 11-mm left upper lobe pulmonary nodule.    Past Medical History  Diagnosis Date  . Coronary artery disease     DES LAD 2005  . Hypertension   . Hyperlipidemia   . Depression   . GERD (gastroesophageal reflux disease)   . Insomnia   . Sleep apnea, obstructive   . Pulmonary embolism   . Osteoarthritis    Past Surgical History  Procedure Date  . Spinal injections     x9 for back  . Artificial right eye   . Hernia repair     bilateral  . Carpel tunnel release   . Coronary stent placement    Prior to Admission medications   Medication Sig Start Date End Date Taking? Authorizing Provider  ALPRAZolam Prudy Feeler) 0.5 MG tablet Take 1 mg by mouth at bedtime as needed. As needed for sleep.   Yes Historical Provider, MD  Ascorbic Acid (VITAMIN C) 1000 MG tablet Take 1,000 mg by mouth daily.     Yes Historical Provider, MD  aspirin 81 MG chewable tablet Chew 324 mg by mouth daily.     Yes Historical Provider, MD  aspirin 81 MG tablet Take 81 mg by mouth every evening.    Yes Historical Provider, MD  Cholecalciferol (VITAMIN D3) 1000 UNITS CAPS Take 2  capsules by mouth daily.    Yes Historical Provider, MD  docusate sodium (COLACE) 100 MG capsule Take 100 mg by mouth 2 (two) times a week. Sometimes he takes 4 times weekly if needed for constipation.   Yes Historical Provider, MD  fenofibrate micronized (LOFIBRA) 200 MG capsule Take 200 mg by mouth daily before breakfast.     Yes Historical Provider, MD  Glucosamine-Chondroitin 750-600 MG TABS Take 1 tablet by mouth daily.     Yes Historical Provider, MD  lisinopril-hydrochlorothiazide (PRINZIDE,ZESTORETIC) 20-12.5 MG per tablet Take 1 tablet by mouth 2 (two) times daily.     Yes Historical Provider, MD  meloxicam (MOBIC) 15 MG tablet Take 15 mg by mouth daily.    Yes Historical Provider, MD  Multiple Vitamin (MULTIVITAMIN) tablet Take 1 tablet by mouth daily.     Yes Historical Provider, MD  Omega-3 Fatty Acids (FISH OIL) 1000 MG CAPS Take 2 capsules by mouth 4 (four) times a week.    Yes Historical Provider, MD  polyethylene glycol powder (MIRALAX) powder Take 17 g by mouth daily.     Yes Historical Provider, MD  psyllium (METAMUCIL) 58.6 % powder Take by mouth. 3 caps daily    Yes Historical Provider, MD  simvastatin (ZOCOR) 40 MG tablet Take 40 mg by  mouth at bedtime.     Yes Historical Provider, MD  traMADol (ULTRAM) 50 MG tablet Take 50 mg by mouth every 6 (six) hours as needed.     Yes Historical Provider, MD  venlafaxine (EFFEXOR-XR) 37.5 MG 24 hr capsule Take 37.5 mg by mouth 2 (two) times daily.     Yes Historical Provider, MD  omeprazole (PRILOSEC) 20 MG capsule Take 20 mg by mouth daily.      Historical Provider, MD  OVER THE COUNTER MEDICATION Take 500 mg by mouth daily. Calcium Citrate OTC. Patient takes 10 per week.     Historical Provider, MD   Allergies Allergies  Allergen Reactions  . Amoxicillin     Rapid heart rate.     Family History History reviewed. No pertinent family history.  Social History  reports that he quit smoking about 15 years ago. His smoking use  included Cigarettes. He has never used smokeless tobacco. He reports that he does not drink alcohol or use illicit drugs.  Review Of Systems  11 points review of systems is negative with an exception of listed in HPI.  Vital Signs: Temp:  [97.5 F (36.4 C)-97.9 F (36.6 C)] 97.5 F (36.4 C) (12/05 1335) Pulse Rate:  [68-85] 75  (12/05 1447) Resp:  [15-20] 20  (12/05 1447) BP: (97-121)/(61-82) 114/82 mmHg (12/05 1447) SpO2:  [97 %-100 %] 98 % (12/05 1447) Weight:  [239 lb (108.41 kg)] 239 lb (108.41 kg) (12/05 1145)    Physical Examination: General: obese adult male in NAD Neuro: AAOx4, speech clear, MAE CV: s1s2 rrr, no m/r/g PULM: resp's even/non-labored, lungs bilaterally diminished GI: obese, bsx4 active Extremities: warm/dry, no edema  Labs and Imaging:  Reviewed. Lab Results  Component Value Date   WBC 9.1 06/08/2011   HGB 12.8* 06/08/2011   HCT 36.1* 06/08/2011   MCV 84.5 06/08/2011   PLT 207 06/08/2011   Lab Results  Component Value Date   NA 136 06/08/2011   K 3.8 06/08/2011   CL 104 06/08/2011   CO2 21 06/08/2011      Assessment and Plan:  Chest pain in setting of Bilateral PE.  Large clot burden on CT.  Second episode of PE (first after ortho surgery, one year of coumadin and then d/c'd).  Hyper coag panel negative in past--work up per Dr. Gwenyth Bouillon at [facility].  PLAN: -full dose heparin with transition to coumadin per pharmacy -2D ECHO to assess RV fxn -BLE doppler to r/o mobile clot -coumadin education -?if can be followed at [facility]  Cough / Rib pain s/p fall.  PLAN: -delsym for cough -PRN ultram at home for back pain  OSA / 11mm pulmonary nodule.  Pt has previously been followed by Dr. Stevphen Rochester for pulmonary nodule.  Had not changed in past per patient report.   PLAN: -Auto-set CPAP QHS -will need outpatient pulmonary follow up for nodule surveillance   Best practices / Disposition: -->full code -->Heparin gtt for PE /  DVT Px -->Protonix for GI Px -->diet   Canary Brim, NP-C [facility] Pgr: [phone]  Attending MD Addendum: I have examined the patient, discussed the data, assessment and plan with B. Ollis as outlined above. I agree with these plans.   Levy Pupa, MD, PhD 06/08/2011, 6:06 PM [facility] [phone] or if no answer [phone]

## 2011-06-09 ENCOUNTER — Other Ambulatory Visit: Payer: Self-pay

## 2011-06-09 DIAGNOSIS — I2699 Other pulmonary embolism without acute cor pulmonale: Secondary | ICD-10-CM

## 2011-06-09 DIAGNOSIS — G4733 Obstructive sleep apnea (adult) (pediatric): Secondary | ICD-10-CM

## 2011-06-09 DIAGNOSIS — R911 Solitary pulmonary nodule: Secondary | ICD-10-CM

## 2011-06-09 DIAGNOSIS — R0602 Shortness of breath: Secondary | ICD-10-CM

## 2011-06-09 DIAGNOSIS — R079 Chest pain, unspecified: Secondary | ICD-10-CM

## 2011-06-09 LAB — HEPARIN LEVEL (UNFRACTIONATED): Heparin Unfractionated: 0.6 IU/mL (ref 0.30–0.70)

## 2011-06-09 LAB — CARDIAC PANEL(CRET KIN+CKTOT+MB+TROPI)
CK, MB: 3.7 ng/mL (ref 0.3–4.0)
Relative Index: 3.2 — ABNORMAL HIGH (ref 0.0–2.5)
Total CK: 126 U/L (ref 7–232)
Total CK: 127 U/L (ref 7–232)
Troponin I: <0.3 ng/mL (ref <0.30)

## 2011-06-09 LAB — LIPID PANEL
HDL: 42 mg/dL (ref >39)
Total CHOL/HDL Ratio: 4.2 RATIO
VLDL: 29 mg/dL (ref 0–40)

## 2011-06-09 LAB — PROTIME-INR
INR: 1.11 (ref 0.00–1.49)
Prothrombin Time: 14.5 seconds (ref 11.6–15.2)

## 2011-06-09 MED ORDER — DEXTROMETHORPHAN POLISTIREX 30 MG/5ML PO LQCR
30.0000 mg | Freq: Two times a day (BID) | ORAL | Status: DC | PRN
  Administered 2011-06-09 – 2011-06-11 (×3): 30 mg via ORAL
  Filled 2011-06-09 (×3): qty 5

## 2011-06-09 MED ORDER — TRAMADOL HCL 50 MG PO TABS
100.0000 mg | ORAL_TABLET | Freq: Four times a day (QID) | ORAL | Status: DC | PRN
  Administered 2011-06-10 – 2011-06-12 (×6): 100 mg via ORAL
  Filled 2011-06-09 (×7): qty 2

## 2011-06-09 MED ORDER — WARFARIN SODIUM 7.5 MG PO TABS
7.5000 mg | ORAL_TABLET | Freq: Once | ORAL | Status: AC
  Administered 2011-06-09: 7.5 mg via ORAL
  Filled 2011-06-09: qty 1

## 2011-06-09 MED ORDER — HEPARIN SOD (PORCINE) IN D5W 100 UNIT/ML IV SOLN
1850.0000 [IU]/h | INTRAVENOUS | Status: DC
  Administered 2011-06-09 – 2011-06-12 (×5): 1850 [IU]/h via INTRAVENOUS
  Filled 2011-06-09 (×9): qty 250

## 2011-06-09 NOTE — Progress Notes (Signed)
Subjective:    [name] is a 65 year old gentleman with a history of pulmonary and was in. He is now admitted with recurrent bilateral pulmonary embolus.  He also has a history of coronary artery disease, hypertension, hyperlipidemia, and obstructive sleep apnea. He had a stress Myoview study in May of 2012 which was negative.  He has done well overnight. He did not sleep well because the bed is not comfortable. He also continues to have some chest pain.      Marland Kitchen aspirin  324 mg Oral NOW  . aspirin EC  81 mg Oral Daily  . aspirin EC  81 mg Oral Daily  . cholecalciferol  2,000 Units Oral Daily  . dextromethorphan  30 mg Oral BID  . docusate sodium  100 mg Oral 2 times weekly  . heparin  2,000 Units Intravenous Once  . heparin  4,000 Units Intravenous Once  . hydrochlorothiazide  12.5 mg Oral BID  . lisinopril  20 mg Oral BID  . meloxicam  15 mg Oral Daily  . pantoprazole  40 mg Oral Q1200  . patient's guide to using coumadin book   Does not apply Once  . pneumococcal 23 valent vaccine  0.5 mL Intramuscular Tomorrow-1000  . polyethylene glycol  17 g Oral Daily  . psyllium  1 packet Oral Daily  . simvastatin  40 mg Oral QHS  . sodium chloride  3 mL Intravenous Q12H  . venlafaxine  37.5 mg Oral BID  . warfarin  7.5 mg Oral Once  . warfarin   Does not apply Once  . DISCONTD: aspirin  324 mg Oral Once  . DISCONTD: aspirin  81 mg Oral QPM  . DISCONTD: Glucosamine-Chondroitin  1 tablet Oral Daily  . DISCONTD: heparin  1,000 Units/hr Intravenous Once  . DISCONTD: heparin  4,000 Units Intravenous Once  . DISCONTD: lisinopril-hydrochlorothiazide  1 tablet Oral BID  . DISCONTD: polyethylene glycol powder  17 g Oral Daily  . DISCONTD: Vitamin D3  2 capsule Oral Daily      . heparin 1,750 Units/hr (06/09/11 0051)    Objective:  Vital Signs in the last 24 hours: Blood pressure 122/74, pulse 71, temperature 98.3 F (36.8 C), temperature source Oral, resp. rate 18, height 5\' 7"   (1.702 m), weight 239 lb 4.8 oz (108.546 kg), SpO2 95.00%. Temp:  [97.5 F (36.4 C)-98.3 F (36.8 C)] 98.3 F (36.8 C) (12/06 0516) Pulse Rate:  [68-85] 71  (12/06 0004) Resp:  [15-20] 18  (12/06 0516) BP: (97-163)/(61-87) 122/74 mmHg (12/06 0516) SpO2:  [95 %-100 %] 95 % (12/06 0516) Weight:  [238 lb 12.1 oz (108.3 kg)-239 lb 4.8 oz (108.546 kg)] 239 lb 4.8 oz (108.546 kg) (12/06 0516)  Intake/Output from previous day: 12/05 0701 - 12/06 0700 In: 210.9 [I.V.:210.9] Out: 300 [Urine:300] Intake/Output from this shift:    Physical Exam: The patient is alert and oriented x 3.  The mood and affect are normal.   Skin: warm and dry.  Color is normal.    HEENT:   the sclera are nonicteric.  The mucous membranes are moist.  The carotids are 2+ without bruits.  There is no thyromegaly.  There is no JVD.    Lungs: clear.  The chest wall is non tender.    Heart: regular rate with a normal S1 and S2.  There are no murmurs, gallops, or rubs. The PMI is not displaced.     Abdomen: good bowel sounds.  There is no guarding  or rebound.  There is no hepatosplenomegaly or tenderness.  There are no masses.   Extremities:  no clubbing, cyanosis, or edema.  The legs are without rashes.  The distal pulses are intact.   Neuro:  Cranial nerves II - XII are intact.  Motor and sensory functions are intact.     Lab Results:  [facility] 06/08/11 2057 06/08/11 1141  WBC 10.1 9.1  HGB 13.2 12.8*  PLT 211 207    Basename 06/08/11 1141  NA 136  K 3.8  CL 104  CO2 21  GLUCOSE 101*  BUN 21  CREATININE 1.10    Basename 06/09/11 0026 06/08/11 1913  TROPONINI <0.30 <0.30   No results found for this basename: BNP in the last 72 hours Hepatic Function Panel No results found for this basename: PROT,ALBUMIN,AST,ALT,ALKPHOS,BILITOT,BILIDIR,IBILI in the last 72 hours No results found for this basename: CHOL, HDL, LDLCALC, LDLDIRECT, TRIG, CHOLHDL    Basename 06/08/11 1141  INR 1.07   Telemetry:  Normal sinus rhythm   Assessment/Plan:   1. Pulmonary embolus:   the patient is now on heparin. He will be started on Coumadin. I suspect that he'll need Coumadin lifelong since this is a recurrent pulmonary list. The patient has not been doing any exercise recently. She's been sitting around watching television with his wife (per patient). We'll encourage him to exercise more.  2. Hypertension: The patient's blood pressure is well-controlled. We'll continue with his same medications.  3. Chest pain: The patient's cardiac enzymes are negative. He had a negative Myoview study in May. I don't think that any of this admission is result of an acute coronary syndrome.   Vesta Mixer, Montez Hageman., MD, [facility] 06/09/2011, 7:20 AM LOS: Day 1

## 2011-06-09 NOTE — Progress Notes (Signed)
*  PRELIMINARY RESULTS*   Bilateral lower extremity venous Dopplers completed.  There is acute, occlusive deep vein thrombosis noted in the left posterior tibial and peroneal veins.  There is acute, non occlusive deep vein thrombosis noted in the left popliteal vein.  All other veins appear thrombus free.   Aaron [name] 06/09/2011, 9:59 AM

## 2011-06-09 NOTE — Progress Notes (Signed)
Aaron [name] is a 65 y.o. male former smoker admitted on 06/08/2011 with one week of progressive dyspnea and chest pain.  Had Lt 7th rib fx in November.  Found to have b/l PE. PMHx CAD, HTN, Hyperlipidemia, OSA, PE  Tests: 12/05 CT chest>>b/p PE, ATX, LUL 11mm nodule   SUBJECTIVE: Breathing feels better.  Not much cough.  Pain controlled.  Feels hospital settings for CPAP too high.  OBJECTIVE:  Blood pressure 122/74, pulse 71, temperature 98.3 F (36.8 C), temperature source Oral, resp. rate 18, height 5\' 7"  (1.702 m), weight 239 lb 4.8 oz (108.546 kg), SpO2 95.00%.   Intake/Output Summary (Last 24 hours) at 06/09/11 0854 Last data filed at 06/09/11 0300  Gross per 24 hour  Intake 210.89 ml  Output    300 ml  Net -89.11 ml   HEENT - artifical Rt eye, no sinus tenderness Cardiac - s1s2 regular Chest - no wheeze/rales Abd - soft, nontender Ext - no edema Neuro - normal strength Psych - normal mood, behavior   Lab Results  Component Value Date   CREATININE 1.10 06/08/2011   BUN 21 06/08/2011   NA 136 06/08/2011   K 3.8 06/08/2011   CL 104 06/08/2011   CO2 21 06/08/2011   Lab Results  Component Value Date   WBC 10.1 06/08/2011   HGB 13.2 06/08/2011   HCT 37.3* 06/08/2011   MCV 85.9 06/08/2011   PLT 211 06/08/2011   Lab Results  Component Value Date   INR 1.11 06/09/2011   INR 1.07 06/08/2011   INR 4.30* 05/23/2006   PROTIME 51.6* 05/23/2006   Dg Chest 2 View  06/08/2011  *RADIOLOGY REPORT*  Clinical Data: Shortness of breath, left chest pain and rib pain, history of blood clots in 2007  CHEST - 2 VIEW  Comparison: 06/07/2007  Findings: Upper-normal size of cardiac silhouette. Calcified tortuous aorta. Pulmonary vascularity normal. Minimal emphysematous and bronchitic changes. No pulmonary infiltrate, pleural effusion, or pneumothorax. Bones appear demineralized without definite acute left rib abnormality. End plate spur formation thoracic spine.  IMPRESSION: Minimal  emphysematous and bronchitic changes without acute infiltrate.  Original Report Authenticated By: Lollie Marrow, M.D.   Dg Ribs Unilateral Left  06/08/2011  *RADIOLOGY REPORT*  Clinical Data: Left rib pain, fall, increased shortness of breath  LEFT RIBS - 2 VIEW  Comparison: 06/08/2011 chest radiograph  Findings: Probable nipple shadow, not seen on preceding chest radiograph. Bones appear demineralized. AC joint degenerative changes on left. BB placed at site of symptoms lower left chest. Minimally displaced fracture anterior left seventh rib. No other fracture or bone destruction seen. Multilevel endplate spur formation thoracic spine.  IMPRESSION: Minimally displaced fracture anterior left seventh rib.  Original Report Authenticated By: Lollie Marrow, M.D.   Ct Angio Chest W/cm &/or Wo Cm  06/08/2011  *RADIOLOGY REPORT*  Clinical Data:  Shortness of breath.  CT ANGIOGRAPHY CHEST WITH CONTRAST  Technique:  Multidetector CT imaging of the chest was performed using the standard protocol during bolus administration of intravenous contrast.  Multiplanar CT image reconstructions including MIPs were obtained to evaluate the vascular anatomy.  Contrast: OMNIPAQUE IOHEXOL 300 MG/ML IV SOLN  Comparison:  Chest CT 02/[birth year]27 2007.  Findings:  The chest wall is unremarkable.  No supraclavicular or axillary lymphadenopathy.  The bony thorax is intact.  Moderate degenerative changes in the thoracic spine.  The heart is normal in size.  No pericardial effusion.  No mediastinal or hilar lymphadenopathy.  Small scattered lymph  nodes are noted.  The aorta is normal in caliber.  No focal aneurysm or obvious dissection.  Dense coronary artery calcifications are noted and are advanced for age.  The esophagus is grossly normal.  The pulmonary arterial tree is fairly well opacified.  There are numerous filling defects consistent with pulmonary emboli.  Central thrombus noted in the distal aspect of the right main pulmonary  artery.  Significant clot burden.  The right ventricular strain pattern is observed.  The both lungs demonstrate patchy areas of atelectasis.  Moderate breathing motion artifact.  No infiltrates, effusions or edema.  No worrisome mass lesions. There is a vague nodular density in the left upper lobe on image number 43 which will require follow-up. It measures a maximum of 11 mm. The tracheobronchial tree is grossly normal.  The upper abdomen demonstrates multiple benign appearing hepatic cysts.  Review of the MIP images confirms the above findings.  IMPRESSION:  1.  Bilateral pulmonary emboli with significant clot burden.  Right ventricular strain pattern is noted. 2.  Mild atherosclerotic changes involving the aorta and dense coronary artery calcifications. 3.  Indeterminate 11-mm left upper lobe pulmonary nodule. Recommend follow-up noncontrast chest CT in 6 months to reevaluate.  Critical Value/emergent results were called by telephone at the time of interpretation on 06/08/2011  at 1615 hours  to  Dr. Lynelle Doctor, who verbally acknowledged these results.  Original Report Authenticated By: P. Loralie Champagne, M.D.    ASSESSMENT/PLAN:  Recurrent b/l PE -first episode after ortho procedure, and had negative hypercoag w/u at that time -f/u doppler legs -f/u Echo to asses RV function -continue coumadin>>?if he would be candidate for rivaroxaban in place of coumadin  Hypoxemia -will need to assess for home oxygen when he gets closer to d/c home  Rib fracture -bronchial hygiene -pain control  Hx of smoking -will need outpt PFT to assess for obstructive lung disease  Pulmonary nodule -will need f/u as outpt  [facility] -auto CPAP>>will adjust settings   Charisma Charlot Pager:  [phone] 06/09/2011, 8:54 AM

## 2011-06-09 NOTE — Progress Notes (Signed)
ANTICOAGULATION CONSULT NOTE - Follow Up Consult  Pharmacy Consult for Heparin Indication: (+)PE and DVT  Allergies  Allergen Reactions  . Amoxicillin     Rapid heart rate.     Patient Measurements: Height: 5\' 7"  (170.2 cm) Weight: 239 lb 4.8 oz (108.546 kg) IBW/kg (Calculated) : 66.1    Vital Signs: Temp: 97.1 F (36.2 C) (12/06 2053) Temp src: Oral (12/06 1445) BP: 154/81 mmHg (12/06 2053) Pulse Rate: 81  (12/06 2053)  Labs:  Aaron [name] 06/09/11 2156 06/09/11 0748 06/09/11 0026 06/08/11 2057 06/08/11 1913 06/08/11 1141  HGB -- -- -- 13.2 -- 12.8*  HCT -- -- -- 37.3* -- 36.1*  PLT -- -- -- 211 -- 207  APTT -- -- -- -- -- 32  LABPROT -- 14.5 -- -- -- 14.1  INR -- 1.11 -- -- -- 1.07  HEPARINUNFRC 0.60 0.32 -- 0.14* -- --  CREATININE -- -- -- -- -- 1.10  CKTOTAL -- 127 126 -- 113 --  CKMB -- 3.7 4.0 -- 3.6 --  TROPONINI -- <0.30 <0.30 -- <0.30 --   Estimated Creatinine Clearance: 78.7 ml/min (by C-G formula based on Cr of 1.1).  Assessment/Plan:  65yo male on Heparin for PE.  Dopplers today revealed (+)DVT in the LLE also.  Heparin level is therapeutic on 1850units/hr.  No bleeding problems noted.  Will continue current rate and f/u in AM.  [name] P 06/09/2011,10:48 PM

## 2011-06-09 NOTE — Progress Notes (Signed)
  Echocardiogram 2D Echocardiogram has been performed.  Juanita Laster Tudor Chandley, RDCS 06/09/2011, 10:55 AM

## 2011-06-09 NOTE — Progress Notes (Signed)
ANTICOAGULATION CONSULT NOTE - Follow Up Consult  Pharmacy Consult for Heparin and Coumadin Indication: pulmonary embolus  Allergies  Allergen Reactions  . Amoxicillin     Rapid heart rate.     Patient Measurements: Height: 5\' 7"  (170.2 cm) Weight: 239 lb 4.8 oz (108.546 kg) IBW/kg (Calculated) : 66.1  Heparin Dosing Weight: 90.4kg  Vital Signs: Temp: 98.3 F (36.8 C) (12/06 0516) BP: 122/74 mmHg (12/06 0516) Pulse Rate: 71  (12/06 0004)  Labs:  Basename 06/09/11 0748 06/09/11 0026 06/08/11 2057 06/08/11 1913 06/08/11 1141  HGB -- -- 13.2 -- 12.8*  HCT -- -- 37.3* -- 36.1*  PLT -- -- 211 -- 207  APTT -- -- -- -- 32  LABPROT 14.5 -- -- -- 14.1  INR 1.11 -- -- -- 1.07  HEPARINUNFRC 0.32 -- 0.14* -- --  CREATININE -- -- -- -- 1.10  CKTOTAL 127 126 -- 113 --  CKMB 3.7 4.0 -- 3.6 --  TROPONINI <0.30 <0.30 -- <0.30 --   Estimated Creatinine Clearance: 78.7 ml/min (by C-G formula based on Cr of 1.1).   Medications:  Scheduled:    . aspirin EC  81 mg Oral Daily  . cholecalciferol  2,000 Units Oral Daily  . docusate sodium  100 mg Oral 2 times weekly  . heparin  2,000 Units Intravenous Once  . heparin  4,000 Units Intravenous Once  . hydrochlorothiazide  12.5 mg Oral BID  . lisinopril  20 mg Oral BID  . meloxicam  15 mg Oral Daily  . pantoprazole  40 mg Oral Q1200  . patient's guide to using coumadin book   Does not apply Once  . pneumococcal 23 valent vaccine  0.5 mL Intramuscular Tomorrow-1000  . polyethylene glycol  17 g Oral Daily  . psyllium  1 packet Oral Daily  . simvastatin  40 mg Oral QHS  . sodium chloride  3 mL Intravenous Q12H  . venlafaxine  37.5 mg Oral BID  . warfarin  7.5 mg Oral Once  . warfarin   Does not apply Once  . DISCONTD: aspirin  324 mg Oral Once  . DISCONTD: aspirin  324 mg Oral NOW  . DISCONTD: aspirin EC  81 mg Oral Daily  . DISCONTD: aspirin  81 mg Oral QPM  . DISCONTD: dextromethorphan  30 mg Oral BID  . DISCONTD:  Glucosamine-Chondroitin  1 tablet Oral Daily  . DISCONTD: heparin  1,000 Units/hr Intravenous Once  . DISCONTD: heparin  4,000 Units Intravenous Once  . DISCONTD: lisinopril-hydrochlorothiazide  1 tablet Oral BID  . DISCONTD: polyethylene glycol powder  17 g Oral Daily  . DISCONTD: Vitamin D3  2 capsule Oral Daily   Infusions:    . heparin 1,750 Units/hr (06/09/11 0051)    Assessment: Bilateral PEs: Patient is receiving Heparin bridging to Coumadin.  Today is Day #2 of a minimum 5-day overlap period.  His Heparin level is therapeutic but is at the lower end of the therapeutic range.  Today's INR is essentially unchanged after his first dose of Coumadin as would be expected.  Goal of Therapy:  INR 2-3 Heparin level 0.3-0.7 units/ml   Plan:  Increase Heparin to 1850 units/hr Recheck Heparin level in 8 hours Continue with Coumadin 7.5mg  today Recheck INR with AM labs.  Estella Husk, Pharm.D., BCPS Clinical Pharmacist  Pager [phone] 06/09/2011, 9:45 AM

## 2011-06-10 ENCOUNTER — Other Ambulatory Visit: Payer: Self-pay

## 2011-06-10 LAB — CBC
HCT: 33.9 % — ABNORMAL LOW (ref 39.0–52.0)
Hemoglobin: 11.7 g/dL — ABNORMAL LOW (ref 13.0–17.0)
MCH: 29.4 pg (ref 26.0–34.0)
MCHC: 34.5 g/dL (ref 30.0–36.0)
MCV: 85.2 fL (ref 78.0–100.0)
Platelets: 214 10*3/uL (ref 150–400)
RBC: 3.98 MIL/uL — ABNORMAL LOW (ref 4.22–5.81)
RDW: 12.9 % (ref 11.5–15.5)
WBC: 9.1 10*3/uL (ref 4.0–10.5)

## 2011-06-10 LAB — PROTIME-INR
INR: 2.06 — ABNORMAL HIGH (ref 0.00–1.49)
Prothrombin Time: 23.6 s — ABNORMAL HIGH (ref 11.6–15.2)

## 2011-06-10 LAB — HEPARIN LEVEL (UNFRACTIONATED): Heparin Unfractionated: 0.32 IU/mL (ref 0.30–0.70)

## 2011-06-10 MED ORDER — BIOTENE DRY MOUTH MT LIQD
15.0000 mL | Freq: Two times a day (BID) | OROMUCOSAL | Status: DC
  Administered 2011-06-10 – 2011-06-12 (×5): 15 mL via OROMUCOSAL

## 2011-06-10 MED ORDER — MORPHINE SULFATE 2 MG/ML IJ SOLN
2.0000 mg | Freq: Four times a day (QID) | INTRAMUSCULAR | Status: DC | PRN
  Filled 2011-06-10: qty 1

## 2011-06-10 MED ORDER — CHLORHEXIDINE GLUCONATE 0.12 % MT SOLN
15.0000 mL | Freq: Two times a day (BID) | OROMUCOSAL | Status: DC
  Administered 2011-06-10 – 2011-06-12 (×5): 15 mL via OROMUCOSAL
  Filled 2011-06-10 (×7): qty 15

## 2011-06-10 MED ORDER — MORPHINE SULFATE 2 MG/ML IJ SOLN
INTRAMUSCULAR | Status: AC
  Administered 2011-06-10: 2 mg via INTRAVENOUS
  Filled 2011-06-10: qty 1

## 2011-06-10 MED ORDER — NON FORMULARY: 2.0000 | Freq: Every day | Status: DC | PRN

## 2011-06-10 MED ORDER — WARFARIN SODIUM 2.5 MG PO TABS
2.5000 mg | ORAL_TABLET | Freq: Once | ORAL | Status: AC
  Administered 2011-06-10: 2.5 mg via ORAL
  Filled 2011-06-10: qty 1

## 2011-06-10 MED ORDER — PSYLLIUM 95 % PO PACK
1.0000 | PACK | Freq: Every day | ORAL | Status: DC | PRN
  Filled 2011-06-10: qty 1

## 2011-06-10 NOTE — Progress Notes (Signed)
ANTICOAGULATION CONSULT NOTE - Follow Up Consult  Pharmacy Consult for Heparin and Coumadin Indication: pulmonary embolus and DVT  Assessment: 65 yo male on Day#3 of heparin/coumadin overlap for +DVT/PE. Heparin level therapeutic at 0.32 and INR jumped to 2.06 after two doses of coumadin. No bleeding or problems with IV drip per RN.   Goal of Therapy:  INR 2-3 Heparin level 0.3-0.7 units/ml   Plan:  1. Continue heparin IV at 1850 units/hr. Check heparin level in AM. 2. Coumadin 2.5mg  x1 today. Check INR in AM.  Ival Bible 06/10/2011,10:36 AM  Allergies  Allergen Reactions  . Amoxicillin     Rapid heart rate.     Patient Measurements: Height: 5\' 7"  (170.2 cm) Weight: 236 lb 15.9 oz (107.5 kg) IBW/kg (Calculated) : 66.1  Heparin Dosing Weight: 90.1kg  Vital Signs: Temp: 98.4 F (36.9 C) (12/07 0433) BP: 170/78 mmHg (12/07 0433) Pulse Rate: 76  (12/07 0433)  Labs:  Alvira Philips 06/10/11 0620 06/09/11 2156 06/09/11 0748 06/09/11 0026 06/08/11 2057 06/08/11 1913 06/08/11 1141  HGB 11.7* -- -- -- 13.2 -- --  HCT 33.9* -- -- -- 37.3* -- 36.1*  PLT 214 -- -- -- 211 -- 207  APTT -- -- -- -- -- -- 32  LABPROT 23.6* -- 14.5 -- -- -- 14.1  INR 2.06* -- 1.11 -- -- -- 1.07  HEPARINUNFRC 0.32 0.60 0.32 -- -- -- --  CREATININE -- -- -- -- -- -- 1.10  CKTOTAL -- -- 127 126 -- 113 --  CKMB -- -- 3.7 4.0 -- 3.6 --  TROPONINI -- -- <0.30 <0.30 -- <0.30 --   Estimated Creatinine Clearance: 78.3 ml/min (by C-G formula based on Cr of 1.1).   Medications:  Scheduled:    . antiseptic oral rinse  15 mL Mouth Rinse q12n4p  . aspirin EC  81 mg Oral Daily  . chlorhexidine  15 mL Mouth/Throat BID  . cholecalciferol  2,000 Units Oral Daily  . docusate sodium  100 mg Oral 2 times weekly  . hydrochlorothiazide  12.5 mg Oral BID  . lisinopril  20 mg Oral BID  . meloxicam  15 mg Oral Daily  . pantoprazole  40 mg Oral Q1200  . pneumococcal 23 valent vaccine  0.5 mL Intramuscular  Tomorrow-1000  . polyethylene glycol  17 g Oral Daily  . psyllium  1 packet Oral Daily  . simvastatin  40 mg Oral QHS  . sodium chloride  3 mL Intravenous Q12H  . venlafaxine  37.5 mg Oral BID  . warfarin  7.5 mg Oral ONCE-1800  . warfarin   Does not apply Once

## 2011-06-10 NOTE — Progress Notes (Signed)
Aaron [name] is a 65 y.o. male former smoker admitted on 06/08/2011 with one week of progressive dyspnea and chest pain.  Had Lt 7th rib fx in November.  Found to have b/l PE. PMHx CAD, HTN, Hyperlipidemia, OSA, PE  Tests: 12/05 CT chest>>b/p PE, ATX, LUL 11mm nodule 12/06 Doppler legs>>Lt leg occlusive peroneal and posterior tibial DVT, and non-occlusive DVT in popliteal vein.   12/06 Echo>>nl LV function, mild RV dilation, moderate RV systolic dysfunction  SUBJECTIVE: Breathing feels better.  Not much cough.  Pain controlled.  Used his home CPAP mask, and slept better.  OBJECTIVE:  Blood pressure 170/78, pulse 76, temperature 98.4 F (36.9 C), temperature source Oral, resp. rate 18, height 5\' 7"  (1.702 m), weight 236 lb 15.9 oz (107.5 kg), SpO2 96.00%.   Intake/Output Summary (Last 24 hours) at 06/10/11 0836 Last data filed at 06/10/11 0700  Gross per 24 hour  Intake 1592.54 ml  Output   1202 ml  Net 390.54 ml   HEENT - artifical Rt eye, no sinus tenderness Cardiac - s1s2 regular Chest - no wheeze/rales Abd - soft, nontender Ext - mild edema Lt leg Neuro - normal strength Psych - normal mood, behavior   Lab Results  Component Value Date   CREATININE 1.10 06/08/2011   BUN 21 06/08/2011   NA 136 06/08/2011   K 3.8 06/08/2011   CL 104 06/08/2011   CO2 21 06/08/2011   Lab Results  Component Value Date   WBC 9.1 06/10/2011   HGB 11.7* 06/10/2011   HCT 33.9* 06/10/2011   MCV 85.2 06/10/2011   PLT 214 06/10/2011   Lab Results  Component Value Date   INR 2.06* 06/10/2011   INR 1.11 06/09/2011   INR 1.07 06/08/2011   PROTIME 51.6* 05/23/2006   Dg Chest 2 View  06/08/2011  *RADIOLOGY REPORT*  Clinical Data: Shortness of breath, left chest pain and rib pain, history of blood clots in 2007  CHEST - 2 VIEW  Comparison: 06/07/2007  Findings: Upper-normal size of cardiac silhouette. Calcified tortuous aorta. Pulmonary vascularity normal. Minimal emphysematous and bronchitic changes.  No pulmonary infiltrate, pleural effusion, or pneumothorax. Bones appear demineralized without definite acute left rib abnormality. End plate spur formation thoracic spine.  IMPRESSION: Minimal emphysematous and bronchitic changes without acute infiltrate.  Original Report Authenticated By: Lollie Marrow, M.D.   Dg Ribs Unilateral Left  06/08/2011  *RADIOLOGY REPORT*  Clinical Data: Left rib pain, fall, increased shortness of breath  LEFT RIBS - 2 VIEW  Comparison: 06/08/2011 chest radiograph  Findings: Probable nipple shadow, not seen on preceding chest radiograph. Bones appear demineralized. AC joint degenerative changes on left. BB placed at site of symptoms lower left chest. Minimally displaced fracture anterior left seventh rib. No other fracture or bone destruction seen. Multilevel endplate spur formation thoracic spine.  IMPRESSION: Minimally displaced fracture anterior left seventh rib.  Original Report Authenticated By: Lollie Marrow, M.D.   Ct Angio Chest W/cm &/or Wo Cm  06/08/2011  *RADIOLOGY REPORT*  Clinical Data:  Shortness of breath.  CT ANGIOGRAPHY CHEST WITH CONTRAST  Technique:  Multidetector CT imaging of the chest was performed using the standard protocol during bolus administration of intravenous contrast.  Multiplanar CT image reconstructions including MIPs were obtained to evaluate the vascular anatomy.  Contrast: OMNIPAQUE IOHEXOL 300 MG/ML IV SOLN  Comparison:  Chest CT 02/[birth year]1927 2007.  Findings:  The chest wall is unremarkable.  No supraclavicular or axillary lymphadenopathy.  The bony thorax is  intact.  Moderate degenerative changes in the thoracic spine.  The heart is normal in size.  No pericardial effusion.  No mediastinal or hilar lymphadenopathy.  Small scattered lymph nodes are noted.  The aorta is normal in caliber.  No focal aneurysm or obvious dissection.  Dense coronary artery calcifications are noted and are advanced for age.  The esophagus is grossly normal.  The  pulmonary arterial tree is fairly well opacified.  There are numerous filling defects consistent with pulmonary emboli.  Central thrombus noted in the distal aspect of the right main pulmonary artery.  Significant clot burden.  The right ventricular strain pattern is observed.  The both lungs demonstrate patchy areas of atelectasis.  Moderate breathing motion artifact.  No infiltrates, effusions or edema.  No worrisome mass lesions. There is a vague nodular density in the left upper lobe on image number 43 which will require follow-up. It measures a maximum of 11 mm. The tracheobronchial tree is grossly normal.  The upper abdomen demonstrates multiple benign appearing hepatic cysts.  Review of the MIP images confirms the above findings.  IMPRESSION:  1.  Bilateral pulmonary emboli with significant clot burden.  Right ventricular strain pattern is noted. 2.  Mild atherosclerotic changes involving the aorta and dense coronary artery calcifications. 3.  Indeterminate 11-mm left upper lobe pulmonary nodule. Recommend follow-up noncontrast chest CT in 6 months to reevaluate.  Critical Value/emergent results were called by telephone at the time of interpretation on 06/08/2011  at 1615 hours  to  Dr. Lynelle Doctor, who verbally acknowledged these results.  Original Report Authenticated By: P. Loralie Champagne, M.D.    ASSESSMENT/PLAN:  Recurrent b/l PE with Lt leg DVT -first episode after ortho procedure, and had negative hypercoag w/u at that time -continue coumadin per primary team  Hypoxemia -will need to assess for home oxygen when he gets closer to d/c home  Rib fracture -bronchial hygiene -pain control  Hx of smoking -will need outpt PFT to assess for obstructive lung disease  Pulmonary nodule -will need f/u as outpt  [facility] -auto CPAP  Can be available as needed over weekend.  If he is d/c home over weekend, he would then need to have f/u with pulmonary in 2 to 3 weeks.  If he remains in the hospital,  will revisit on Monday 12/10.  Call if help needed sooner.  Alon Mazor Pager:  [phone] 06/10/2011, 8:36 AM

## 2011-06-10 NOTE — Progress Notes (Signed)
Subjective:    [name] is a 65 year old gentleman with a  history of coronary artery disease, hypertension, hyperlipidemia, and obstructive sleep apnea and pulmonary embolus.  He is now admitted with recurrent bilateral pulmonary embolus.  He had a stress Myoview study in May of 2012 which was negative.  He has done well overnight. He did not sleep well because the bed is not comfortable. He is feeling better today.  He remains on heparin drip.     Marland Kitchen aspirin EC  81 mg Oral Daily  . cholecalciferol  2,000 Units Oral Daily  . docusate sodium  100 mg Oral 2 times weekly  . hydrochlorothiazide  12.5 mg Oral BID  . lisinopril  20 mg Oral BID  . meloxicam  15 mg Oral Daily  . pantoprazole  40 mg Oral Q1200  . pneumococcal 23 valent vaccine  0.5 mL Intramuscular Tomorrow-1000  . polyethylene glycol  17 g Oral Daily  . psyllium  1 packet Oral Daily  . simvastatin  40 mg Oral QHS  . sodium chloride  3 mL Intravenous Q12H  . venlafaxine  37.5 mg Oral BID  . warfarin  7.5 mg Oral ONCE-1800  . warfarin   Does not apply Once  . DISCONTD: aspirin  324 mg Oral NOW  . DISCONTD: aspirin EC  81 mg Oral Daily  . DISCONTD: dextromethorphan  30 mg Oral BID      . heparin 1,850 Units/hr (06/10/11 0413)  . DISCONTD: heparin 1,750 Units/hr (06/09/11 0051)    Objective:  Vital Signs in the last 24 hours: Blood pressure 170/78, pulse 76, temperature 98.4 F (36.9 C), temperature source Oral, resp. rate 18, height 5\' 7"  (1.702 m), weight 236 lb 15.9 oz (107.5 kg), SpO2 96.00%. Temp:  [97.1 F (36.2 C)-98.4 F (36.9 C)] 98.4 F (36.9 C) (12/07 0433) Pulse Rate:  [73-85] 76  (12/07 0433) Resp:  [18-20] 18  (12/07 0433) BP: (147-170)/(76-84) 170/78 mmHg (12/07 0433) SpO2:  [93 %-96 %] 96 % (12/07 0433) Weight:  [236 lb 15.9 oz (107.5 kg)] 236 lb 15.9 oz (107.5 kg) (12/07 0412)  Intake/Output from previous day: 12/06 0701 - 12/07 0700 In: 1501.1 [P.O.:360; I.V.:1141.1] Out: 1202  [Urine:1200; Stool:2] Intake/Output from this shift:    Physical Exam: The patient is alert and oriented x 3.  The mood and affect are normal.   Skin: warm and dry.  Color is normal.    HEENT:   the sclera are nonicteric.  The mucous membranes are moist.  The carotids are 2+ without bruits.  There is no thyromegaly.  There is no JVD.    Lungs: clear.  The chest wall is non tender.    Heart: regular rate with a normal S1 and S2.  There are no murmurs, gallops, or rubs. The PMI is not displaced.     Abdomen: good bowel sounds.  There is no guarding or rebound.  There is no hepatosplenomegaly or tenderness.  There are no masses.   Extremities:  no clubbing, cyanosis, or edema.  The legs are without rashes.  The distal pulses are intact.   Neuro:  Cranial nerves II - XII are intact.  Motor and sensory functions are intact.     Lab Results:  [facility] 06/10/11 0620 06/08/11 2057  WBC 9.1 10.1  HGB 11.7* 13.2  PLT 214 211    Basename 06/08/11 1141  NA 136  K 3.8  CL 104  CO2 21  GLUCOSE 101*  BUN  21  CREATININE 1.10    Basename 06/09/11 0748 06/09/11 0026  TROPONINI <0.30 <0.30   No results found for this basename: BNP in the last 72 hours Hepatic Function Panel No results found for this basename: PROT,ALBUMIN,AST,ALT,ALKPHOS,BILITOT,BILIDIR,IBILI in the last 72 hours Lab Results  Component Value Date   CHOL 177 06/09/2011    Basename 06/10/11 0620  INR 2.06*   Telemetry: Normal sinus rhythm   Assessment/Plan:   1. Pulmonary embolus:   the patient is now on heparin. He is on Coumadin. I suspect that he'll need Coumadin lifelong since this is a recurrent pulmonary list. The patient has not been doing any exercise recently. She's been sitting around watching television with his wife (per patient). We'll encourage him to exercise more.  Increase activity.    He will need at least 5 days of heparin and 2 days of overlap with heparin and therapeutic coumadin.  He is  therapeutic with his coumadin but it was normal yesterday.  This could be a lab error .  Will plan to DC to home on Sunday.   2. Hypertension: The patient's blood pressure is well-controlled. We'll continue with his same medications.  3. Chest pain: The patient's cardiac enzymes are negative. He had a negative Myoview study in May. I don't think that any of this admission is result of an acute coronary syndrome.   Vesta Mixer, Aaron [name]., MD, [facility] 06/10/2011, 7:21 AM LOS: Day 2

## 2011-06-11 LAB — PROTIME-INR: Prothrombin Time: 32.7 seconds — ABNORMAL HIGH (ref 11.6–15.2)

## 2011-06-11 LAB — CBC
MCH: 29.6 pg (ref 26.0–34.0)
MCHC: 34.6 g/dL (ref 30.0–36.0)
MCV: 85.5 fL (ref 78.0–100.0)
Platelets: 215 10*3/uL (ref 150–400)
RBC: 3.72 MIL/uL — ABNORMAL LOW (ref 4.22–5.81)

## 2011-06-11 MED ORDER — PSYLLIUM 0.52 G PO CAPS
1.0400 g | ORAL_CAPSULE | Freq: Every day | ORAL | Status: DC
  Administered 2011-06-12: 1.04 g via ORAL
  Filled 2011-06-11: qty 2

## 2011-06-11 NOTE — Progress Notes (Signed)
Pt. Stated that he would put himself on CPAP when ready for bed. CPAP is set up at pt.'s bedside.

## 2011-06-11 NOTE — Progress Notes (Signed)
ANTICOAGULATION CONSULT NOTE - Follow Up Consult  Pharmacy Consult for Heparin, Coumadin Indication: DVT, Bilateral PE's  Allergies  Allergen Reactions  . Amoxicillin     Rapid heart rate.     Patient Measurements: Height: 5\' 7"  (170.2 cm) Weight: 243 lb 6.2 oz (110.4 kg) (bed scale ) IBW/kg (Calculated) : 66.1    Vital Signs: Temp: 98.7 F (37.1 C) (12/08 0518) Temp src: Oral (12/08 0518) BP: 153/67 mmHg (12/08 0518) Pulse Rate: 73  (12/08 0518)  Labs:  Basename 06/11/11 0500 06/10/11 0620 06/09/11 2156 06/09/11 0748 06/09/11 0026 06/08/11 2057 06/08/11 1913  HGB 11.0* 11.7* -- -- -- -- --  HCT 31.8* 33.9* -- -- -- 37.3* --  PLT 215 214 -- -- -- 211 --  APTT -- -- -- -- -- -- --  LABPROT 32.7* 23.6* -- 14.5 -- -- --  INR 3.13* 2.06* -- 1.11 -- -- --  HEPARINUNFRC 0.40 0.32 0.60 -- -- -- --  CREATININE -- -- -- -- -- -- --  CKTOTAL -- -- -- 127 126 -- 113  CKMB -- -- -- 3.7 4.0 -- 3.6  TROPONINI -- -- -- <0.30 <0.30 -- <0.30   Estimated Creatinine Clearance: 79.4 ml/min (by C-G formula based on Cr of 1.1).   Medications:  Scheduled:    . antiseptic oral rinse  15 mL Mouth Rinse q12n4p  . aspirin EC  81 mg Oral Daily  . chlorhexidine  15 mL Mouth/Throat BID  . cholecalciferol  2,000 Units Oral Daily  . docusate sodium  100 mg Oral 2 times weekly  . hydrochlorothiazide  12.5 mg Oral BID  . lisinopril  20 mg Oral BID  . meloxicam  15 mg Oral Daily  . morphine      . pantoprazole  40 mg Oral Q1200  . psyllium  1.04 g Oral Daily  . simvastatin  40 mg Oral QHS  . sodium chloride  3 mL Intravenous Q12H  . venlafaxine  37.5 mg Oral BID  . warfarin  2.5 mg Oral ONCE-1800  . warfarin   Does not apply Once  . DISCONTD: polyethylene glycol  17 g Oral Daily  . DISCONTD: psyllium  1 packet Oral Daily   Infusions:    . heparin 1,850 Units/hr (06/10/11 1807)    Assessment: 65 yo male on Day#4 of heparin/coumadin overlap for +DVT/PE. Heparin level therapeutic at  0.4   INR 3.13 high for goal after only 3 doses of coumadin. H/H/P stable.  Goal of Therapy:  INR 2-3 Heparin level 0.3-0.7 units/ml   Plan:  Continue Heparin at 1850 units/hr. No Coumadin today.  Thresia Ramanathan, Elisha Headland, Pharm.D. 06/11/2011 4:23 PM

## 2011-06-11 NOTE — Progress Notes (Signed)
Subjective:    [name] is a 65 year old gentleman with a  history of coronary artery disease, hypertension, hyperlipidemia, and obstructive sleep apnea and pulmonary embolus.  He is now admitted with recurrent bilateral pulmonary embolus.  He had a stress Myoview study in May of 2012 which was negative.  Had some left side pain last night that he feels may be related to to rib fx; no SOB     . antiseptic oral rinse  15 mL Mouth Rinse q12n4p  . aspirin EC  81 mg Oral Daily  . chlorhexidine  15 mL Mouth/Throat BID  . cholecalciferol  2,000 Units Oral Daily  . docusate sodium  100 mg Oral 2 times weekly  . hydrochlorothiazide  12.5 mg Oral BID  . lisinopril  20 mg Oral BID  . meloxicam  15 mg Oral Daily  . morphine      . pantoprazole  40 mg Oral Q1200  . simvastatin  40 mg Oral QHS  . sodium chloride  3 mL Intravenous Q12H  . venlafaxine  37.5 mg Oral BID  . warfarin  2.5 mg Oral ONCE-1800  . warfarin   Does not apply Once  . DISCONTD: polyethylene glycol  17 g Oral Daily  . DISCONTD: psyllium  1 packet Oral Daily      . heparin 1,850 Units/hr (06/10/11 1807)    Objective:  Vital Signs in the last 24 hours: Blood pressure 153/67, pulse 73, temperature 98.7 F (37.1 C), temperature source Oral, resp. rate 19, height 5\' 7"  (1.702 m), weight 243 lb 6.2 oz (110.4 kg), SpO2 98.00%. Temp:  [98.6 F (37 C)-99.3 F (37.4 C)] 98.7 F (37.1 C) (12/08 0518) Pulse Rate:  [73-82] 73  (12/08 0518) Resp:  [18-19] 19  (12/08 0518) BP: (127-163)/(67-80) 153/67 mmHg (12/08 0518) SpO2:  [98 %-99 %] 98 % (12/08 0518) Weight:  [243 lb 6.2 oz (110.4 kg)] 243 lb 6.2 oz (110.4 kg) (12/08 0518)  Intake/Output from previous day: 12/07 0701 - 12/08 0700 In: 1246.4 [P.O.:900; I.V.:346.4] Out: 1852 [Urine:1850; Stool:2] Intake/Output from this shift: Total I/O In: 240 [P.O.:240] Out: 500 [Urine:500]  Physical Exam: The patient is alert and oriented x 3.  The mood and affect are  normal.   Skin: warm and dry.  Color is normal.    HEENT:  normal  Lungs: diminished BS throughout The chest wall is non tender.    Heart: regular rate with a normal S1 and S2.  There are no murmurs, gallops, or rubs. The PMI is not displaced.     Abdomen: good bowel sounds.  There is no guarding or rebound.  There is no hepatosplenomegaly or tenderness.  There are no masses.   Extremities:  no clubbing, cyanosis, or edema.  The legs are without rashes.  The distal pulses are intact.   Neuro:  Cranial nerves II - XII are intact.  Motor and sensory functions are intact.     Lab Results:  Basename 06/11/11 0500 06/10/11 0620  WBC 7.9 9.1  HGB 11.0* 11.7*  PLT 215 214    Basename 06/08/11 1141  NA 136  K 3.8  CL 104  CO2 21  GLUCOSE 101*  BUN 21  CREATININE 1.10    Basename 06/09/11 0748 06/09/11 0026  TROPONINI <0.30 <0.30   No results found for this basename: BNP in the last 72 hours Hepatic Function Panel No results found for this basename: PROT,ALBUMIN,AST,ALT,ALKPHOS,BILITOT,BILIDIR,IBILI in the last 72 hours Lab Results  Component  Value Date   CHOL 177 06/09/2011    Basename 06/11/11 0500  INR 3.13*   Telemetry: Normal sinus rhythm   Assessment/Plan:   1. Pulmonary embolus:   the patient is now on heparin. He is on Coumadin. I suspect that he'll need Coumadin lifelong since this is a recurrent pulmonary list. The patient has not been doing any exercise recently. She's been sitting around watching television with his wife (per patient). We'll encourage him to exercise more.  Increase activity.    He will need at least 5 days of heparin and 2 days of overlap with heparin and therapeutic coumadin.  He is therapeutic with his coumadin but it was normal yesterday.  If INR therapeutic tomorrow, can probably dc heparin and dc home on coumadin.  2. Hypertension: The patient's blood pressure is well-controlled. We'll continue with his same medications.  3. Chest  pain: The patient's cardiac enzymes are negative. He had a negative Myoview study in May. 4. Anemia - follow HGB   Vesta Mixer, Montez Hageman., MD, [facility] 06/11/2011, 11:36 AM LOS: Day 3

## 2011-06-12 LAB — CBC
HCT: 31.8 % — ABNORMAL LOW (ref 39.0–52.0)
MCH: 29.7 pg (ref 26.0–34.0)
MCV: 85 fL (ref 78.0–100.0)
RDW: 12.9 % (ref 11.5–15.5)
WBC: 8 10*3/uL (ref 4.0–10.5)

## 2011-06-12 LAB — BASIC METABOLIC PANEL
BUN: 20 mg/dL (ref 6–23)
CO2: 25 mEq/L (ref 19–32)
Chloride: 102 mEq/L (ref 96–112)
Creatinine, Ser: 1.1 mg/dL (ref 0.50–1.35)

## 2011-06-12 MED ORDER — WARFARIN SODIUM 5 MG PO TABS: 5.0000 mg | ORAL_TABLET | Freq: Once | ORAL | Status: DC

## 2011-06-12 MED ORDER — WARFARIN SODIUM 5 MG PO TABS
5.0000 mg | ORAL_TABLET | Freq: Once | ORAL | Status: DC
  Filled 2011-06-12: qty 1

## 2011-06-12 NOTE — Progress Notes (Signed)
RT Note: Placed pt on Auto CPAP with his own nasal mask. Pt tolerating well, RT will monitor.

## 2011-06-12 NOTE — Discharge Summary (Signed)
Physician Discharge Summary  Patient ID: Aaron [name] MRN: [id number] DOB/AGE: 65-Jul-1947 65 y.o.  Admit date: 06/08/2011 Discharge date: 06/12/2011  Primary Cardiologist: Peter Swaziland, MD Consultants: Coralyn Helling, MD  Primary Discharge Diagnosis:  1 Pulmonary embolus  - recurrent, bilateral  - Coumadin anticoagulation initiated  - LLE DVT 2 Chest pain  - Negative serial cardiac markers 3 LUL nodule (11 mm) 4 Hypoxemia 5 Anemia  Secondary Discharge Diagnoses: Coronary artery disease   - DES LAD 2005   - negative stress Myoview, 5/12 Hypertension  Hyperlipidemia  Depression   GERD Insomnia   Sleep apnea, obstructive   Pulmonary embolism   Osteoarthritis  Obesity History of tobacco  Reason for Admission: 65 year-old male, with history as outlined above, including remote history of PE, presented to the ED with progressive SOB and DOE. Initial workup notable for elevated d-dimer 7.9, and he was admitted for evaluation and management of probable recurrent PE.  Procedures: CT angiogram of chest   LE venous Dopplers   2-D echocardiogram  Hospital Course: Patient was placed on intravenous heparin, following results of CT angiogram which revealed bilateral pulmonary emboli. He was also subsequently started on Coumadin, with recommendation to continue this indefinitely, given prior history of PE. By time of discharge, patient had maintained a therapeutic INR for 3 days and had an INR of 2.2, by time of discharge. He was given 5 mg of Coumadin that evening, with recommendation to continue on a regimen of 2.5/5 mg, alternating daily. Arrangements will be made for early post hospital followup in our Coumadin clinic.  He was also followed by Dr. Coralyn Helling, in consultation, with recommendation to pursue further evaluation as an outpatient for possible obstructive lung disease, as well as continued close monitoring of the noted LUL nodule.  Of note, there was significant clot burden  noted by CTA of chest, as well as RV strain. A 2-D echo was obtained, indicating moderate LV dysfunction, and mild/moderate RV dysfunction.  Patient's complaint of CP was evaluated with serial cardiac markers, which were all within normal limits. Given his negative nuclear imaging study in May, no further workup was recommended.  Discharge Vitals: Blood pressure 113/67, pulse 64, temperature 97 F (36.1 C), temperature source Oral, resp. rate 18, height 5\' 7"  (1.702 m), weight 239 lb 10.2 oz (108.7 kg), SpO2 96.00%.  Labs:   Lab Results  Component Value Date   WBC 8.0 06/12/2011   HGB 11.1* 06/12/2011   HCT 31.8* 06/12/2011   MCV 85.0 06/12/2011   PLT 212 06/12/2011    Lab 06/12/11 0605  NA 138  K 3.8  CL 102  CO2 25  BUN 20  CREATININE 1.10  CALCIUM 9.2  PROT --  BILITOT --  ALKPHOS --  ALT --  AST --  GLUCOSE 106*   Lab Results  Component Value Date   CKTOTAL 127 06/09/2011   CKMB 3.7 06/09/2011   TROPONINI <0.30 06/09/2011    Lab Results  Component Value Date   CHOL 177 06/09/2011   Lab Results  Component Value Date   HDL 42 06/09/2011   Lab Results  Component Value Date   LDLCALC 106* 06/09/2011   Lab Results  Component Value Date   TRIG 147 06/09/2011   Lab Results  Component Value Date   CHOLHDL 4.2 06/09/2011   No results found for this basename: LDLDIRECT     DISPOSITION: Stable condition  FOLLOW UP PLANS AND APPOINTMENTS:  Follow-up Information    Follow  up with Angelica Chessman., MD. (call office for appointment)       Follow up with Peter Swaziland, MD in 2 weeks. (office will arrange)    Contact information:   [address] [place] [address] [phone]       Follow up with Coralyn Helling, MD in 3 weeks. (call office for appointment)    Contact information:   [address] [employer] [facility] [place] [address] [phone]          DISCHARGE  MEDICATIONS: Current Discharge Medication List    CONTINUE these medications which have NOT CHANGED   Details  ALPRAZolam (XANAX) 0.5 MG tablet Take 1 mg by mouth at bedtime as needed. As needed for sleep.    Ascorbic Acid (VITAMIN C) 1000 MG tablet Take 1,000 mg by mouth daily.      aspirin 81 MG chewable tablet Chew 324 mg by mouth daily.      aspirin 81 MG tablet Take 81 mg by mouth every evening.     Cholecalciferol (VITAMIN D3) 1000 UNITS CAPS Take 2 capsules by mouth daily.     docusate sodium (COLACE) 100 MG capsule Take 100 mg by mouth 2 (two) times a week. Sometimes he takes 4 times weekly if needed for constipation.    fenofibrate micronized (LOFIBRA) 200 MG capsule Take 200 mg by mouth daily before breakfast.      Glucosamine-Chondroitin 750-600 MG TABS Take 1 tablet by mouth daily.      lisinopril-hydrochlorothiazide (PRINZIDE,ZESTORETIC) 20-12.5 MG per tablet Take 1 tablet by mouth 2 (two) times daily.      meloxicam (MOBIC) 15 MG tablet Take 15 mg by mouth daily.     Multiple Vitamin (MULTIVITAMIN) tablet Take 1 tablet by mouth daily.      Omega-3 Fatty Acids (FISH OIL) 1000 MG CAPS Take 2 capsules by mouth 4 (four) times a week.     polyethylene glycol powder (MIRALAX) powder Take 17 g by mouth daily.      psyllium (METAMUCIL) 58.6 % powder Take by mouth. 3 caps daily     simvastatin (ZOCOR) 40 MG tablet Take 40 mg by mouth at bedtime.      traMADol (ULTRAM) 50 MG tablet Take 50 mg by mouth every 6 (six) hours as needed.      venlafaxine (EFFEXOR-XR) 37.5 MG 24 hr capsule Take 37.5 mg by mouth 2 (two) times daily.      omeprazole (PRILOSEC) 20 MG capsule Take 20 mg by mouth daily.      OVER THE COUNTER MEDICATION Take 500 mg by mouth daily. Calcium Citrate OTC. Patient takes 10 per week.         BRING ALL MEDICATIONS WITH YOU TO FOLLOW UP APPOINTMENTS  Time spent with patient to include physician time: Greater than 30 minutes, including physician  time.  Signed: Gene Geovany Trudo 06/12/2011, 2:14 PM Co-Sign MD

## 2011-06-12 NOTE — Progress Notes (Signed)
Subjective:    [name] is a 65 year old gentleman with a  history of coronary artery disease, hypertension, hyperlipidemia, and obstructive sleep apnea and pulmonary embolus.  He is now admitted with recurrent bilateral pulmonary embolus.  He had a stress Myoview study in May of 2012 which was negative.  Denies chest pain and SOB     . antiseptic oral rinse  15 mL Mouth Rinse q12n4p  . aspirin EC  81 mg Oral Daily  . chlorhexidine  15 mL Mouth/Throat BID  . cholecalciferol  2,000 Units Oral Daily  . docusate sodium  100 mg Oral 2 times weekly  . hydrochlorothiazide  12.5 mg Oral BID  . lisinopril  20 mg Oral BID  . meloxicam  15 mg Oral Daily  . pantoprazole  40 mg Oral Q1200  . psyllium  1.04 g Oral Daily  . simvastatin  40 mg Oral QHS  . sodium chloride  3 mL Intravenous Q12H  . venlafaxine  37.5 mg Oral BID  . warfarin   Does not apply Once      . heparin 1,850 Units/hr (06/11/11 2221)    Objective:  Vital Signs in the last 24 hours: Blood pressure 137/80, pulse 69, temperature 98.1 F (36.7 C), temperature source Oral, resp. rate 17, height 5\' 7"  (1.702 m), weight 239 lb 10.2 oz (108.7 kg), SpO2 91.00%. Temp:  [98 F (36.7 C)-98.9 F (37.2 C)] 98.1 F (36.7 C) (12/09 0417) Pulse Rate:  [69-70] 69  (12/09 0417) Resp:  [17-18] 17  (12/09 0417) BP: (122-147)/(70-82) 137/80 mmHg (12/09 0417) SpO2:  [91 %-100 %] 91 % (12/09 0417) Weight:  [239 lb 10.2 oz (108.7 kg)] 239 lb 10.2 oz (108.7 kg) (12/09 0417)  Intake/Output from previous day: 12/08 0701 - 12/09 0700 In: 1523.4 [P.O.:840; I.V.:683.4] Out: 2351 [Urine:2350; Stool:1] Intake/Output from this shift: Total I/O In: 600 [P.O.:600] Out: 400 [Urine:400]  Physical Exam: The patient is alert and oriented x 3.  The mood and affect are normal.   Skin: warm and dry.  Color is normal.    HEENT:  normal  Lungs: diminished BS throughout The chest wall is non tender.    Heart: regular rate with a normal S1  and S2.  There are no murmurs, gallops, or rubs. The PMI is not displaced.     Abdomen: good bowel sounds.  There is no guarding or rebound.  There is no hepatosplenomegaly or tenderness.  There are no masses.   Extremities:  no clubbing, cyanosis, or edema.  The legs are without rashes.  The distal pulses are intact.   Neuro:  Cranial nerves II - XII are intact.  Motor and sensory functions are intact.     Lab Results:  Basename 06/12/11 0605 06/11/11 0500  WBC 8.0 7.9  HGB 11.1* 11.0*  PLT 212 215    Basename 06/12/11 0605  NA 138  K 3.8  CL 102  CO2 25  GLUCOSE 106*  BUN 20  CREATININE 1.10    Lab Results  Component Value Date   CHOL 177 06/09/2011    Basename 06/12/11 0605  INR 2.24*   Telemetry: Normal sinus rhythm   Assessment/Plan:   1. Pulmonary embolus:   Patient will most likely need Coumadin lifelong since this is a recurrent pulmonary embolus. INR has been therapeutic for 3 days. DC heparin; give 5 mg coumadin po today and then alternate with 2.5 mg. Check INR in coumadin clinic 06/14/11.  2. Hypertension: The patient's  blood pressure is well-controlled. We'll continue with his same medications.  3. Chest pain: The patient's cardiac enzymes are negative. He had a negative Myoview study in May. 4. Anemia  DC today and f/u Dr Aaron [name]. Also fu with pulmonary for pulmonary embolus and lung nodule noted on CT.  Olga Millers  06/12/2011, 9:54 AM

## 2011-06-13 NOTE — Discharge Summary (Signed)
See progress note.

## 2011-06-14 ENCOUNTER — Ambulatory Visit (INDEPENDENT_AMBULATORY_CARE_PROVIDER_SITE_OTHER): Payer: Medicare Other | Admitting: *Deleted

## 2011-06-14 DIAGNOSIS — Z7901 Long term (current) use of anticoagulants: Secondary | ICD-10-CM

## 2011-06-14 DIAGNOSIS — I2699 Other pulmonary embolism without acute cor pulmonale: Secondary | ICD-10-CM

## 2011-06-14 NOTE — Patient Instructions (Signed)

## 2011-06-21 ENCOUNTER — Ambulatory Visit (INDEPENDENT_AMBULATORY_CARE_PROVIDER_SITE_OTHER): Payer: Medicare Other | Admitting: *Deleted

## 2011-06-21 DIAGNOSIS — I2699 Other pulmonary embolism without acute cor pulmonale: Secondary | ICD-10-CM

## 2011-06-21 DIAGNOSIS — Z7901 Long term (current) use of anticoagulants: Secondary | ICD-10-CM

## 2011-06-29 ENCOUNTER — Ambulatory Visit (INDEPENDENT_AMBULATORY_CARE_PROVIDER_SITE_OTHER): Payer: Medicare Other | Admitting: *Deleted

## 2011-06-29 DIAGNOSIS — I2699 Other pulmonary embolism without acute cor pulmonale: Secondary | ICD-10-CM

## 2011-06-29 DIAGNOSIS — Z7901 Long term (current) use of anticoagulants: Secondary | ICD-10-CM

## 2011-06-30 ENCOUNTER — Emergency Department (HOSPITAL_COMMUNITY)
Admission: EM | Admit: 2011-06-30 | Discharge: 2011-06-30 | Disposition: A | Discharge status: Home / Self Care | Payer: Medicare Other | Attending: Emergency Medicine | Admitting: Emergency Medicine

## 2011-06-30 ENCOUNTER — Emergency Department (HOSPITAL_COMMUNITY): Payer: Medicare Other

## 2011-06-30 ENCOUNTER — Encounter (HOSPITAL_COMMUNITY): Payer: Self-pay | Admitting: Emergency Medicine

## 2011-06-30 DIAGNOSIS — G4733 Obstructive sleep apnea (adult) (pediatric): Secondary | ICD-10-CM | POA: Insufficient documentation

## 2011-06-30 DIAGNOSIS — W230XXA Caught, crushed, jammed, or pinched between moving objects, initial encounter: Secondary | ICD-10-CM | POA: Insufficient documentation

## 2011-06-30 DIAGNOSIS — S62639A Displaced fracture of distal phalanx of unspecified finger, initial encounter for closed fracture: Secondary | ICD-10-CM | POA: Insufficient documentation

## 2011-06-30 DIAGNOSIS — S62609A Fracture of unspecified phalanx of unspecified finger, initial encounter for closed fracture: Secondary | ICD-10-CM

## 2011-06-30 DIAGNOSIS — S61319A Laceration without foreign body of unspecified finger with damage to nail, initial encounter: Secondary | ICD-10-CM

## 2011-06-30 DIAGNOSIS — S61209A Unspecified open wound of unspecified finger without damage to nail, initial encounter: Secondary | ICD-10-CM | POA: Insufficient documentation

## 2011-06-30 DIAGNOSIS — I2510 Atherosclerotic heart disease of native coronary artery without angina pectoris: Secondary | ICD-10-CM | POA: Insufficient documentation

## 2011-06-30 DIAGNOSIS — S61219A Laceration without foreign body of unspecified finger without damage to nail, initial encounter: Secondary | ICD-10-CM

## 2011-06-30 DIAGNOSIS — M79609 Pain in unspecified limb: Secondary | ICD-10-CM | POA: Insufficient documentation

## 2011-06-30 DIAGNOSIS — E785 Hyperlipidemia, unspecified: Secondary | ICD-10-CM | POA: Insufficient documentation

## 2011-06-30 DIAGNOSIS — R011 Cardiac murmur, unspecified: Secondary | ICD-10-CM | POA: Insufficient documentation

## 2011-06-30 DIAGNOSIS — K219 Gastro-esophageal reflux disease without esophagitis: Secondary | ICD-10-CM | POA: Insufficient documentation

## 2011-06-30 DIAGNOSIS — I10 Essential (primary) hypertension: Secondary | ICD-10-CM | POA: Insufficient documentation

## 2011-06-30 HISTORY — DX: Post-traumatic stress disorder, unspecified: F43.10

## 2011-06-30 MED ORDER — CEPHALEXIN 500 MG PO CAPS: 500.0000 mg | ORAL_CAPSULE | Freq: Four times a day (QID) | ORAL | Status: AC

## 2011-06-30 MED ORDER — OXYCODONE-ACETAMINOPHEN 5-325 MG PO TABS
1.0000 | ORAL_TABLET | Freq: Once | ORAL | Status: AC
  Administered 2011-06-30: 1 via ORAL
  Filled 2011-06-30: qty 1

## 2011-06-30 MED ORDER — CEFAZOLIN SODIUM 1 G IJ SOLR
1.0000 g | Freq: Once | INTRAMUSCULAR | Status: AC
  Administered 2011-06-30: 1 g via INTRAMUSCULAR
  Filled 2011-06-30: qty 10

## 2011-06-30 MED ORDER — LORAZEPAM 1 MG PO TABS
1.0000 mg | ORAL_TABLET | Freq: Once | ORAL | Status: AC
  Administered 2011-06-30: 1 mg via ORAL
  Filled 2011-06-30: qty 1

## 2011-06-30 MED ORDER — OXYCODONE-ACETAMINOPHEN 5-325 MG PO TABS: 1.0000 | ORAL_TABLET | ORAL | Status: AC | PRN

## 2011-06-30 NOTE — ED Provider Notes (Signed)
History     CSN: [id number]  Arrival date & time 06/30/11  0004   First MD Initiated Contact with Patient 06/30/11 0114      No chief complaint on file.   (Consider location/radiation/quality/duration/timing/severity/associated sxs/prior treatment) HPI  41yoM h/o PE on coumadin presents with finger injury. Patient states that just prior to arrival his finger was caught in the window attached to his trunk. He complains of finger laceration. His pain is 9/10 at this time. He's had constant bleeding since the incident. He denies chest pain, shortness of breath, weakness, dizziness. He had his INR checked today which was 3.2. He denies numbness, tingling, weakness of his extremities. He denies other injury.   ED Notes, ED Provider Notes from 06/30/11 0000 to 06/30/11 00:13:06       Nada Libman, RN 06/30/2011 00:11      PT. REPORTS INJURY TO RIGHT DISTAL MIDDLE FINGER , ACCIDENTALLY CLOSED THE BACK DOOR OF SUV AND HIT HIS RIGHT MIDDLE FINGER THIS EVENING , PRESENTS WITH CRUSHING INJURY WITH FINGER NAIL HANGING FROM FINGER.     Past Medical History  Diagnosis Date  . Coronary artery disease     DES LAD 2005  . Hypertension   . Hyperlipidemia   . Depression   . GERD (gastroesophageal reflux disease)   . Insomnia   . Sleep apnea, obstructive   . Pulmonary embolism ~ 03/2006    bilaterally  . Osteoarthritis   . PONV (postoperative nausea and vomiting)   . Shortness of breath 06/08/11    "w/exertion; that's why I'm here"  . Heart murmur   . Blood transfusion     "@ birth"  . Anemia     "@ birth"  . 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 PTSD (post-traumatic stress disorder)     Past Surgical History  Procedure Date  . Spinal injections 2006-2007    "maybe 10 injections for herniated discs"  . Carpel tunnel release ~ 06/2005/~ 08/2005    right/left  . Coronary stent placement 02/13/2004    1  . Inguinal hernia repair 1983  . Inguinal  hernia repair 1985  . Tonsillectomy     "when I was real young"  . Eye surgery     age 65; "ruptured  right pupil"  . Eye surgery     age 65  . Enucleation     age 65  . Eye surgery 1957    placed artificial right eye  . Cardiac catheterization   . Knee arthroscopy ~ 06/2005    right  . Coronary angioplasty     No family history on file.  History  Substance Use Topics  . Smoking status: Former Smoker -- 1.5 packs/day for 25 years    Types: Cigarettes    Quit date: 07/10/1995  . Smokeless tobacco: Never Used  . Alcohol Use: Yes     "maybe a beer once a year"      Review of Systems  All other systems reviewed and are negative.   except as noted HPI   Allergies  Amoxicillin  Home Medications   Current Outpatient Rx  Name Route Sig Dispense Refill  . ALPRAZOLAM 0.5 MG PO TABS Oral Take 1 mg by mouth at bedtime as needed. As needed for sleep.    Marland Kitchen VITAMIN C 1000 MG PO TABS Oral Take 1,000 mg by mouth daily.      . ASPIRIN 81 MG PO  TABS Oral Take 81 mg by mouth every evening.     Marland Kitchen DOCUSATE SODIUM 100 MG PO CAPS Oral Take 100 mg by mouth 2 (two) times a week. Sometimes he takes 4 times weekly if needed for constipation.    . FENOFIBRATE MICRONIZED 200 MG PO CAPS Oral Take 200 mg by mouth daily before breakfast.      . GLUCOSAMINE-CHONDROITIN 750-600 MG PO TABS Oral Take 1 tablet by mouth daily.      . GUAIFENESIN ER 600 MG PO TB12 Oral Take 1,200 mg by mouth 2 (two) times daily as needed. For congestion     . LISINOPRIL-HYDROCHLOROTHIAZIDE 20-12.5 MG PO TABS Oral Take 2 tablets by mouth every morning.     Marland Kitchen MELOXICAM 15 MG PO TABS Oral Take 15 mg by mouth daily.     Marland Kitchen ONE-DAILY MULTI VITAMINS PO TABS Oral Take 1 tablet by mouth daily.      Marland Kitchen FISH OIL 1000 MG PO CAPS Oral Take 2 capsules by mouth 4 (four) times a week.     Marland Kitchen OMEPRAZOLE 20 MG PO CPDR Oral Take 20 mg by mouth daily.      Marland Kitchen POLYETHYLENE GLYCOL 3350 PO POWD Oral Take 17 g by mouth daily.     . PSYLLIUM  58.6 % PO POWD Oral Take by mouth. 3 caps daily     . SIMVASTATIN 40 MG PO TABS Oral Take 40 mg by mouth at bedtime.      . TRAMADOL HCL 50 MG PO TABS Oral Take 50 mg by mouth every 6 (six) hours as needed.      . VENLAFAXINE HCL 37.5 MG PO CP24 Oral Take 37.5 mg by mouth 2 (two) times daily.      . WARFARIN SODIUM 5 MG PO TABS Oral Take 2.5-5 mg by mouth one time only at 6 PM. Patient took 5 mg through 06/27/11. After INR check today, patient was told to hold dose today and resume tomorrow (06/30/11) with 2.5mg  daily.    . CEPHALEXIN 500 MG PO CAPS Oral Take 1 capsule (500 mg total) by mouth 4 (four) times daily. 40 capsule 0  . OXYCODONE-ACETAMINOPHEN 5-325 MG PO TABS Oral Take 1 tablet by mouth every 4 (four) hours as needed for pain. 20 tablet 0    BP 137/72  Pulse 68  Temp(Src) 98.4 F (36.9 C) (Oral)  Resp 18  SpO2 97%  Physical Exam  Nursing note and vitals reviewed. Constitutional: He is oriented to person, place, and time. He appears well-developed and well-nourished. No distress.  HENT:  Head: Atraumatic.  Mouth/Throat: Oropharynx is clear and moist.  Eyes: Conjunctivae are normal. Pupils are equal, round, and reactive to light.  Neck: Neck supple.  Cardiovascular: Normal rate, regular rhythm, normal heart sounds and intact distal pulses.  Exam reveals no gallop and no friction rub.   No murmur heard. Pulmonary/Chest: Effort normal. No respiratory distress. He has no wheezes. He has no rales.  Abdominal: Soft. Bowel sounds are normal. There is no tenderness. There is no rebound and no guarding.  Musculoskeletal: Normal range of motion. He exhibits no edema and no tenderness.       Rt middle finger with horizontal laceration dorsal aspect of finger through paronychia and nailbed 2cm with active bleeding. Distal sensation intact. Cap refill < 2 sec. Nail hanging off. Underlying laceration/crush injury with uneven edges  Neurological: He is alert and oriented to person, place,  and time.  Skin: Skin is warm and  dry.  Psychiatric: He has a normal mood and affect.    ED Course  LACERATION REPAIR Date/Time: 06/30/2011 11:23 PM Performed by: Forbes Cellar Authorized by: Forbes Cellar Consent: Verbal consent obtained. Written consent not obtained. Risks and benefits: risks, benefits and alternatives were discussed Consent given by: patient Patient understanding: patient states understanding of the procedure being performed Patient consent: the patient's understanding of the procedure matches consent given Imaging studies: imaging studies available Patient identity confirmed: arm band Body area: upper extremity Location details: right long finger Laceration length: 2 cm Foreign bodies: no foreign bodies Tendon involvement: none Nerve involvement: none Vascular damage: no Anesthesia: digital block Local anesthetic: lidocaine 1% without epinephrine Anesthetic total: 3 ml Patient sedated: no Irrigation solution: saline Amount of cleaning: standard Skin closure: 5-0 nylon Number of sutures: 5 Technique: simple Approximation: close Approximation difficulty: complex Dressing: pressure dressing and 4x4 sterile gauze Comments: Unable to complete nailbed laceration repair 2/2 nailbed being crushed. Sutures pulled through   (including critical care time)  Labs Reviewed - No data to display Dg Finger Middle Right  06/30/2011  *RADIOLOGY REPORT*  Clinical Data: Right middle finger caught in car; distal finger pain.  RIGHT MIDDLE FINGER 2+V  Comparison: None.  Findings: There is a comminuted fracture involving the distal tuft of the third distal phalanx, with significant surrounding soft tissue disruption.  Degenerative change is noted at the third distal interphalangeal joint.  Remaining visualized joint spaces are preserved.  IMPRESSION:  1.  Comminuted fracture involving the distal tuft of the third distal phalanx, with surrounding soft tissue disruption. 2.   Osteoarthritis at the third distal interphalangeal joint.  Original Report Authenticated By: Tonia Ghent, M.D.    1. Finger fracture   2. Nailbed laceration, finger   3. Finger laceration    MDM  Tuft fracture, finger laceration. Neurovasc intact. On coumadin with INR 3.2 checked this afternoon as outpatient. Unable to fully repair nailbed laceration. D/W Hand Surgery Dr. Christain [place] f/u in office. Control of bleeding and home.  Bleeding controlled after petroleum gauze and pressure.  Ancef given. Home with percocet, pain control. Will f/u with hand surgery in 1-2 days. Given strict precautions for return.        Forbes Cellar, MD 06/30/11 2329
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
## 2011-06-30 NOTE — ED Notes (Signed)
The pt has had his med including his im antibiotic.
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
## 2011-07-06 ENCOUNTER — Ambulatory Visit (INDEPENDENT_AMBULATORY_CARE_PROVIDER_SITE_OTHER): Payer: Medicare Other | Admitting: *Deleted

## 2011-07-06 DIAGNOSIS — I2699 Other pulmonary embolism without acute cor pulmonale: Secondary | ICD-10-CM

## 2011-07-06 DIAGNOSIS — Z7901 Long term (current) use of anticoagulants: Secondary | ICD-10-CM

## 2011-07-06 LAB — POCT INR: INR: 2.3

## 2011-07-13 ENCOUNTER — Ambulatory Visit (INDEPENDENT_AMBULATORY_CARE_PROVIDER_SITE_OTHER): Payer: Medicare Other | Admitting: *Deleted

## 2011-07-13 DIAGNOSIS — Z7901 Long term (current) use of anticoagulants: Secondary | ICD-10-CM

## 2011-07-13 DIAGNOSIS — I2699 Other pulmonary embolism without acute cor pulmonale: Secondary | ICD-10-CM

## 2011-07-13 LAB — POCT INR: INR: 1.8

## 2011-07-20 ENCOUNTER — Ambulatory Visit (INDEPENDENT_AMBULATORY_CARE_PROVIDER_SITE_OTHER): Payer: Medicare Other | Admitting: Pulmonary Disease

## 2011-07-20 ENCOUNTER — Encounter: Payer: Self-pay | Admitting: Pulmonary Disease

## 2011-07-20 VITALS — BP 130/64 | HR 69 | Temp 97.8°F | Ht 66.5 in | Wt 251.4 lb

## 2011-07-20 DIAGNOSIS — R05 Cough: Secondary | ICD-10-CM

## 2011-07-20 DIAGNOSIS — G4733 Obstructive sleep apnea (adult) (pediatric): Secondary | ICD-10-CM

## 2011-07-20 DIAGNOSIS — R059 Cough, unspecified: Secondary | ICD-10-CM

## 2011-07-20 DIAGNOSIS — J984 Other disorders of lung: Secondary | ICD-10-CM

## 2011-07-20 DIAGNOSIS — I2699 Other pulmonary embolism without acute cor pulmonale: Secondary | ICD-10-CM

## 2011-07-20 DIAGNOSIS — Z87891 Personal history of nicotine dependence: Secondary | ICD-10-CM

## 2011-07-20 DIAGNOSIS — R911 Solitary pulmonary nodule: Secondary | ICD-10-CM

## 2011-07-20 NOTE — Assessment & Plan Note (Signed)
Spirometry was normal today.

## 2011-07-20 NOTE — Assessment & Plan Note (Signed)
Continue CPAP per primary care.

## 2011-07-20 NOTE — Patient Instructions (Signed)
Will schedule CT chest for June 2013 and follow up after this

## 2011-07-20 NOTE — Assessment & Plan Note (Signed)
He is on coumadin life long due to history of recurrent DVT and PE.

## 2011-07-20 NOTE — Progress Notes (Signed)
Chief Complaint  Patient presents with  . HFU    Pt states he has been doing pretty good. Breathing has been okay, occasional cough.    History of Present Illness: Aaron [name] is a 66 y.o. 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  12/05 CT chest>>b/l PE, ATX, LUL 11mm nodule  12/06 Doppler legs>>Lt leg occlusive peroneal and posterior tibial DVT, and non-occlusive DVT in popliteal vein.  12/06 Echo>>nl LV function, mild RV dilation, moderate RV systolic dysfunction  His breathing has been doing much better.  He is not using oxygen since he left the hospital.  He denies chest pain.  He is using his CPAP w/o difficulty.  He denies cough, wheeze, or sputum.  He has not smoked since 1997.    Past Medical History  Diagnosis Date  . Coronary artery disease     DES LAD 2005  . Hypertension   . Hyperlipidemia   . Depression   . GERD (gastroesophageal reflux disease)   . Insomnia   . Sleep apnea, obstructive   . Pulmonary embolism ~ 03/2006    bilaterally  . Osteoarthritis   . PONV (postoperative nausea and vomiting)   . Shortness of breath 06/08/11    "w/exertion; that's why I'm here"  . Heart murmur   . Blood transfusion     "@ birth"  . Anemia     "@ birth"  . 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 PTSD (post-traumatic stress disorder)     Past Surgical History  Procedure Date  . Spinal injections 2006-2007    "maybe 10 injections for herniated discs"  . Carpel tunnel release ~ 06/2005/~ 08/2005    right/left  . Coronary stent placement 02/13/2004    1  . Inguinal hernia repair 1983  . Inguinal hernia repair 1985  . Tonsillectomy     "when I was real young"  . Eye surgery     age 24; "ruptured  right pupil"  . Eye surgery     age 34  . Enucleation     age 66  . Eye surgery 1957    placed artificial right eye  . Cardiac catheterization   . Knee arthroscopy ~ 06/2005    right  .  Coronary angioplasty     Allergies  Allergen Reactions  . Amoxicillin     Rapid heart rate.     Physical Exam:  Blood pressure 130/64, pulse 69, temperature 97.8 F (36.6 C), temperature source Oral, height 5' 6.5" (1.689 m), weight 251 lb 6.4 oz (114.034 kg), SpO2 99.00%. Body mass index is 39.97 kg/(m^2). Wt Readings from Last 2 Encounters:  07/20/11 251 lb 6.4 oz (114.034 kg)  06/12/11 239 lb 10.2 oz (108.7 kg)   HEENT - artifical Rt eye, no sinus tenderness  Cardiac - s1s2 regular  Chest - no wheeze/rales  Abd - soft, nontender  Ext - no edema Neuro - normal strength  Psych - normal mood, behavior  Assessment/Plan:  Outpatient Encounter Prescriptions as of 07/20/2011  Medication Sig Dispense Refill  . ALPRAZolam (XANAX) 0.5 MG tablet Take 1 mg by mouth at bedtime as needed. As needed for sleep.      . Ascorbic Acid (VITAMIN C) 1000 MG tablet Take 1,000 mg by mouth daily.        Marland Kitchen aspirin 81 MG tablet Take 81 mg by mouth every evening.       Marland Kitchen  docusate sodium (COLACE) 100 MG capsule Take 100 mg by mouth 2 (two) times a week. Sometimes he takes 4 times weekly if needed for constipation.      . fenofibrate micronized (LOFIBRA) 200 MG capsule Take 200 mg by mouth daily before breakfast.        . Glucosamine-Chondroitin 750-600 MG TABS Take 1 tablet by mouth daily.        Marland Kitchen guaiFENesin (MUCINEX) 600 MG 12 hr tablet Take 1,200 mg by mouth 2 (two) times daily as needed. For congestion       . lisinopril-hydrochlorothiazide (PRINZIDE,ZESTORETIC) 20-12.5 MG per tablet Take 2 tablets by mouth every morning.       . meloxicam (MOBIC) 15 MG tablet Take 15 mg by mouth daily.       . Multiple Vitamin (MULTIVITAMIN) tablet Take 1 tablet by mouth daily.        . Omega-3 Fatty Acids (FISH OIL) 1000 MG CAPS Take 2 capsules by mouth 4 (four) times a week.       Marland Kitchen omeprazole (PRILOSEC) 20 MG capsule Take 20 mg by mouth daily.        . polyethylene glycol powder (MIRALAX) powder Take 17 g by  mouth daily.       . psyllium (METAMUCIL) 58.6 % powder Take by mouth. 3 caps daily       . simvastatin (ZOCOR) 40 MG tablet Take 40 mg by mouth at bedtime.        . traMADol (ULTRAM) 50 MG tablet Take 50 mg by mouth every 6 (six) hours as needed.        . venlafaxine (EFFEXOR-XR) 37.5 MG 24 hr capsule Take 37.5 mg by mouth 2 (two) times daily.        Marland Kitchen warfarin (COUMADIN) 5 MG tablet Take 2.5-5 mg by mouth one time only at 6 PM. Patient took 5 mg through 06/27/11. After INR check today, patient was told to hold dose today and resume tomorrow (06/30/11) with 2.5mg  daily.        Skylarr Liz Pager:  [phone] 07/20/2011, 11:34 AM

## 2011-07-20 NOTE — Progress Notes (Signed)
Addended by: Tommie Sams on: 07/20/2011 12:14 PM   Modules accepted: Orders

## 2011-07-20 NOTE — Assessment & Plan Note (Signed)
He had incidental finding of LUL nodule on CT chest from December 2012.  He has history of smoking.  Will need CT chest f/u in June 2013.

## 2011-07-27 ENCOUNTER — Ambulatory Visit (INDEPENDENT_AMBULATORY_CARE_PROVIDER_SITE_OTHER): Payer: Medicare Other | Admitting: *Deleted

## 2011-07-27 DIAGNOSIS — I2699 Other pulmonary embolism without acute cor pulmonale: Secondary | ICD-10-CM

## 2011-07-27 DIAGNOSIS — Z7901 Long term (current) use of anticoagulants: Secondary | ICD-10-CM

## 2011-07-27 LAB — POCT INR: INR: 3.4

## 2011-08-10 ENCOUNTER — Ambulatory Visit (INDEPENDENT_AMBULATORY_CARE_PROVIDER_SITE_OTHER): Payer: Medicare Other | Admitting: *Deleted

## 2011-08-10 DIAGNOSIS — I2699 Other pulmonary embolism without acute cor pulmonale: Secondary | ICD-10-CM

## 2011-08-10 DIAGNOSIS — Z7901 Long term (current) use of anticoagulants: Secondary | ICD-10-CM

## 2011-08-10 LAB — POCT INR: INR: 3.4

## 2011-08-24 ENCOUNTER — Ambulatory Visit (INDEPENDENT_AMBULATORY_CARE_PROVIDER_SITE_OTHER): Payer: Medicare Other | Admitting: Pharmacist

## 2011-08-24 DIAGNOSIS — Z7901 Long term (current) use of anticoagulants: Secondary | ICD-10-CM

## 2011-08-24 DIAGNOSIS — I2699 Other pulmonary embolism without acute cor pulmonale: Secondary | ICD-10-CM

## 2011-08-24 LAB — POCT INR: INR: 3.3

## 2011-09-07 ENCOUNTER — Ambulatory Visit (INDEPENDENT_AMBULATORY_CARE_PROVIDER_SITE_OTHER): Payer: Medicare Other | Admitting: *Deleted

## 2011-09-07 DIAGNOSIS — Z7901 Long term (current) use of anticoagulants: Secondary | ICD-10-CM

## 2011-09-07 DIAGNOSIS — I2699 Other pulmonary embolism without acute cor pulmonale: Secondary | ICD-10-CM

## 2011-09-07 LAB — POCT INR: INR: 3.5

## 2011-09-07 MED ORDER — WARFARIN SODIUM 2.5 MG PO TABS: 2.5000 mg | ORAL_TABLET | ORAL | Status: DC

## 2011-09-21 ENCOUNTER — Ambulatory Visit (INDEPENDENT_AMBULATORY_CARE_PROVIDER_SITE_OTHER): Payer: Medicare Other | Admitting: *Deleted

## 2011-09-21 DIAGNOSIS — Z7901 Long term (current) use of anticoagulants: Secondary | ICD-10-CM

## 2011-09-21 DIAGNOSIS — I2699 Other pulmonary embolism without acute cor pulmonale: Secondary | ICD-10-CM

## 2011-10-05 ENCOUNTER — Ambulatory Visit (INDEPENDENT_AMBULATORY_CARE_PROVIDER_SITE_OTHER): Payer: Medicare Other | Admitting: *Deleted

## 2011-10-05 DIAGNOSIS — I2699 Other pulmonary embolism without acute cor pulmonale: Secondary | ICD-10-CM

## 2011-10-05 DIAGNOSIS — Z7901 Long term (current) use of anticoagulants: Secondary | ICD-10-CM

## 2011-11-02 ENCOUNTER — Ambulatory Visit (INDEPENDENT_AMBULATORY_CARE_PROVIDER_SITE_OTHER): Payer: Medicare Other | Admitting: Pharmacist

## 2011-11-02 DIAGNOSIS — Z7901 Long term (current) use of anticoagulants: Secondary | ICD-10-CM

## 2011-11-02 DIAGNOSIS — I2699 Other pulmonary embolism without acute cor pulmonale: Secondary | ICD-10-CM

## 2011-11-02 LAB — POCT INR: INR: 1.9

## 2011-11-23 ENCOUNTER — Ambulatory Visit (INDEPENDENT_AMBULATORY_CARE_PROVIDER_SITE_OTHER): Payer: Medicare Other | Admitting: *Deleted

## 2011-11-23 DIAGNOSIS — I2699 Other pulmonary embolism without acute cor pulmonale: Secondary | ICD-10-CM

## 2011-11-23 DIAGNOSIS — Z7901 Long term (current) use of anticoagulants: Secondary | ICD-10-CM

## 2011-12-14 ENCOUNTER — Other Ambulatory Visit (INDEPENDENT_AMBULATORY_CARE_PROVIDER_SITE_OTHER): Payer: Medicare Other

## 2011-12-14 DIAGNOSIS — R059 Cough, unspecified: Secondary | ICD-10-CM

## 2011-12-14 DIAGNOSIS — R05 Cough: Secondary | ICD-10-CM

## 2011-12-14 DIAGNOSIS — I2699 Other pulmonary embolism without acute cor pulmonale: Secondary | ICD-10-CM

## 2011-12-14 DIAGNOSIS — R911 Solitary pulmonary nodule: Secondary | ICD-10-CM

## 2011-12-14 LAB — BASIC METABOLIC PANEL
BUN: 22 mg/dL (ref 6–23)
CO2: 29 mEq/L (ref 19–32)
Chloride: 102 mEq/L (ref 96–112)
Creatinine, Ser: 1.2 mg/dL (ref 0.4–1.5)
Glucose, Bld: 93 mg/dL (ref 70–99)
Potassium: 4.1 mEq/L (ref 3.5–5.1)

## 2011-12-21 ENCOUNTER — Ambulatory Visit (INDEPENDENT_AMBULATORY_CARE_PROVIDER_SITE_OTHER): Payer: Medicare Other | Admitting: *Deleted

## 2011-12-21 ENCOUNTER — Ambulatory Visit (INDEPENDENT_AMBULATORY_CARE_PROVIDER_SITE_OTHER)
Admission: RE | Admit: 2011-12-21 | Discharge: 2011-12-21 | Disposition: A | Discharge status: Home / Self Care | Payer: Medicare Other | Source: Ambulatory Visit | Attending: Pulmonary Disease | Admitting: Pulmonary Disease

## 2011-12-21 DIAGNOSIS — R911 Solitary pulmonary nodule: Secondary | ICD-10-CM

## 2011-12-21 DIAGNOSIS — Z7901 Long term (current) use of anticoagulants: Secondary | ICD-10-CM

## 2011-12-21 DIAGNOSIS — I2699 Other pulmonary embolism without acute cor pulmonale: Secondary | ICD-10-CM

## 2011-12-21 LAB — POCT INR: INR: 3.1

## 2011-12-21 MED ORDER — IOHEXOL 300 MG/ML  SOLN
80.0000 mL | Freq: Once | INTRAMUSCULAR | Status: AC | PRN
  Administered 2011-12-21: 80 mL via INTRAVENOUS

## 2012-01-11 ENCOUNTER — Ambulatory Visit (INDEPENDENT_AMBULATORY_CARE_PROVIDER_SITE_OTHER): Payer: Medicare Other | Admitting: *Deleted

## 2012-01-11 DIAGNOSIS — I2699 Other pulmonary embolism without acute cor pulmonale: Secondary | ICD-10-CM

## 2012-01-11 DIAGNOSIS — Z7901 Long term (current) use of anticoagulants: Secondary | ICD-10-CM

## 2012-01-17 ENCOUNTER — Encounter: Payer: Self-pay | Admitting: Pulmonary Disease

## 2012-01-17 ENCOUNTER — Ambulatory Visit: Payer: Medicare Other | Admitting: Pulmonary Disease

## 2012-01-17 ENCOUNTER — Ambulatory Visit (INDEPENDENT_AMBULATORY_CARE_PROVIDER_SITE_OTHER): Payer: Medicare Other | Admitting: Pulmonary Disease

## 2012-01-17 VITALS — BP 132/64 | HR 76 | Temp 98.2°F | Ht 64.5 in | Wt 245.2 lb

## 2012-01-17 DIAGNOSIS — R911 Solitary pulmonary nodule: Secondary | ICD-10-CM

## 2012-01-17 NOTE — Progress Notes (Signed)
Chief Complaint  Patient presents with  . Follow-up    CT done 12/21/11. Pt has no cough now. breathing is fine as long as he does not over exert himself    History of Present Illness: Aaron [name] is a 66 y.o. male former smoker pulmonary nodule.  He is here to review his CT chest.   He denies cough, wheeze, chest congestion, chest pain, fever, or hemoptysis.  He gets winded with strenuous activity, but does okay with usual activity.   Past Medical History  Diagnosis Date  . Coronary artery disease     DES LAD 2005  . Hypertension   . Hyperlipidemia   . Depression   . GERD (gastroesophageal reflux disease)   . Insomnia   . Sleep apnea, obstructive   . Pulmonary embolism ~ 03/2006    bilaterally  . Osteoarthritis   . PONV (postoperative nausea and vomiting)   . Shortness of breath 06/08/11    "w/exertion; that's why I'm here"  . Heart murmur   . Blood transfusion     "@ birth"  . Anemia     "@ birth"  . 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 PTSD (post-traumatic stress disorder)     Past Surgical History  Procedure Date  . Spinal injections 2006-2007    "maybe 10 injections for herniated discs"  . Carpel tunnel release ~ 06/2005/~ 08/2005    right/left  . Coronary stent placement 02/13/2004    1  . Inguinal hernia repair 1983  . Inguinal hernia repair 1985  . Tonsillectomy     "when I was real young"  . Eye surgery     age 73; "ruptured  right pupil"  . Eye surgery     age 87  . Enucleation     age 79  . Eye surgery 1957    placed artificial right eye  . Cardiac catheterization   . Knee arthroscopy ~ 06/2005    right  . Coronary angioplasty     Allergies  Allergen Reactions  . Amoxicillin     Rapid heart rate.     Physical Exam:  Blood pressure 132/64, pulse 76, temperature 98.2 F (36.8 C), temperature source Oral, height 5' 4.5" (1.638 m), weight 245 lb 3.2 oz (111.222 kg), SpO2 96.00%.  Body mass index is 41.44  kg/(m^2). Wt Readings from Last 2 Encounters:  01/17/12 245 lb 3.2 oz (111.222 kg)  07/20/11 251 lb 6.4 oz (114.034 kg)   HEENT - artifical Rt eye, no sinus tenderness  Cardiac - s1s2 regular  Chest - no wheeze/rales  Abd - soft, nontender  Ext - no edema Neuro - normal strength  Psych - normal mood, behavior  Ct Chest W Contrast  12/21/2011  *RADIOLOGY REPORT*  Clinical Data: Follow-up evaluation of pulmonary nodule.  History of tobacco use.  Shortness of breath with exertion.  CT CHEST WITH CONTRAST  Technique:  Multidetector CT imaging of the chest was performed following the standard protocol during bolus administration of intravenous contrast.  Contrast: 80mL OMNIPAQUE IOHEXOL 300 MG/ML  SOLN  Comparison: CT of the chest 06/08/2011.  Findings:  Mediastinum: Heart size is borderline enlarged. There is no significant pericardial fluid, thickening or pericardial calcification. There is atherosclerosis of the thoracic aorta, the great vessels of the mediastinum and the coronary arteries, including calcified atherosclerotic plaque in the left main, left anterior descending, left circumflex and right coronary arteries. Numerous borderline enlarged  mediastinal and bilateral hilar lymph nodes are conspicuous in number rather than size. No pathologically enlarged mediastinal or hilar lymph nodes. Esophagus is unremarkable in appearance.  Lungs/Pleura: Previously noted 11 mm left upper lobe nodule has completely resolved, indicative that it was of infectious or inflammatory etiology on the prior examination.  There are multiple additional tiny pulmonary nodules scattered throughout the lungs bilaterally, several which are new including a 5 mm nodule in the superior segment of the left lower lobe (image 21 of series 3) and a 7 mm right lower middle lobe nodule laterally (image 42 of series 3).  In addition, there is increasing conspicuity of a pleural based nodule in the posterior aspect of the left apex  (image 8 of series 3) which has a central solid component that currently measures 8 mm.  No acute consolidative airspace disease.  No pleural effusions.  Upper Abdomen: 1.9 x 1.3 cm low attenuation lesion in segment 2 of the liver is similar to the prior examination, and favored to represent a small hepatic cyst.  There are multiple other smaller low attenuation lesions scattered throughout the hepatic parenchyma which also appears similar to the prior examination.  Musculoskeletal: There are no aggressive appearing lytic or blastic lesions noted in the visualized portions of the skeleton.  IMPRESSION: 1.  While the previously noted 11 mm left upper lobe nodule has completely resolved, there are multiple new nodules and nodules that have slightly increased in size, as detailed above.  The largest of these is 8 mm in the posterior aspect of the left upper lobe.  These warrant attention on follow-up studies. If the patient is at high risk for bronchogenic carcinoma, follow-up chest CT at 3- 6 months is recommended.  If the patient is at low risk for bronchogenic carcinoma, follow-up chest CT at 6-12 months is recommended.  This recommendation follows the consensus statement: Guidelines for Management of Small Pulmonary Nodules Detected on CT Scans: A Statement from the Fleischner Society as published in Radiology 2005; 237:395-400. 2.  Atherosclerosis, including left main three-vessel coronary artery disease. Please note that although the presence of coronary artery calcium documents the presence of coronary artery disease, the severity of this disease and any potential stenosis cannot be assessed on this non-gated CT examination.  Assessment for potential risk factor modification, dietary therapy or pharmacologic therapy may be warranted, if clinically indicated. 3.  Additional incidental findings, as above.  Original Report Authenticated By: Florencia Reasons, M.D.    Assessment/Plan:  Outpatient Encounter  Prescriptions as of 01/17/2012  Medication Sig Dispense Refill  . ALPRAZolam (XANAX) 0.5 MG tablet Take 1 mg by mouth at bedtime as needed. As needed for sleep.      . Ascorbic Acid (VITAMIN C) 1000 MG tablet Take 1,000 mg by mouth daily.        Marland Kitchen aspirin 81 MG tablet Take 81 mg by mouth every evening.       . docusate sodium (COLACE) 100 MG capsule Take 100 mg by mouth 2 (two) times a week. Sometimes he takes 4 times weekly if needed for constipation.      . fenofibrate micronized (LOFIBRA) 200 MG capsule Take 200 mg by mouth daily before breakfast.        . Glucosamine-Chondroitin 750-600 MG TABS Take 1 tablet by mouth daily.        Marland Kitchen lisinopril-hydrochlorothiazide (PRINZIDE,ZESTORETIC) 20-12.5 MG per tablet Take 2 tablets by mouth every morning.       . meloxicam (MOBIC)  15 MG tablet Take 15 mg by mouth daily.       . Multiple Vitamin (MULTIVITAMIN) tablet Take 1 tablet by mouth daily.        Marland Kitchen omeprazole (PRILOSEC) 20 MG capsule Take 20 mg by mouth daily.        . polyethylene glycol powder (MIRALAX) powder Take 17 g by mouth daily.       . simvastatin (ZOCOR) 40 MG tablet Take 40 mg by mouth at bedtime.        . traMADol (ULTRAM) 50 MG tablet Take 50 mg by mouth every 6 (six) hours as needed.        . venlafaxine (EFFEXOR-XR) 37.5 MG 24 hr capsule Take 37.5 mg by mouth daily.       Marland Kitchen warfarin (COUMADIN) 2.5 MG tablet Take 1 tablet (2.5 mg total) by mouth as directed. Take as directed per coumadin clinic  30 tablet  3  . DISCONTD: guaiFENesin (MUCINEX) 600 MG 12 hr tablet Take 1,200 mg by mouth 2 (two) times daily as needed. For congestion       . DISCONTD: Omega-3 Fatty Acids (FISH OIL) 1000 MG CAPS Take 2 capsules by mouth 4 (four) times a week.       Marland Kitchen DISCONTD: psyllium (METAMUCIL) 58.6 % powder Take by mouth. 3 caps daily         Aaron [name] Pager:  [phone] 01/17/2012, 11:21 AM

## 2012-01-17 NOTE — Patient Instructions (Signed)
Will schedule CT chest for December 2013 Follow up after CT chest in December 2013 

## 2012-01-17 NOTE — Assessment & Plan Note (Signed)
He will need f/u CT chest in December 2013.

## 2012-02-01 ENCOUNTER — Ambulatory Visit (INDEPENDENT_AMBULATORY_CARE_PROVIDER_SITE_OTHER): Payer: Medicare Other | Admitting: *Deleted

## 2012-02-01 DIAGNOSIS — I2699 Other pulmonary embolism without acute cor pulmonale: Secondary | ICD-10-CM

## 2012-02-01 DIAGNOSIS — Z7901 Long term (current) use of anticoagulants: Secondary | ICD-10-CM

## 2012-02-15 ENCOUNTER — Ambulatory Visit (INDEPENDENT_AMBULATORY_CARE_PROVIDER_SITE_OTHER): Payer: Medicare Other | Admitting: *Deleted

## 2012-02-15 DIAGNOSIS — I2699 Other pulmonary embolism without acute cor pulmonale: Secondary | ICD-10-CM

## 2012-02-15 DIAGNOSIS — Z7901 Long term (current) use of anticoagulants: Secondary | ICD-10-CM

## 2012-02-15 LAB — POCT INR: INR: 2.2

## 2012-03-14 ENCOUNTER — Ambulatory Visit (INDEPENDENT_AMBULATORY_CARE_PROVIDER_SITE_OTHER): Payer: Medicare Other | Admitting: *Deleted

## 2012-03-14 DIAGNOSIS — I2699 Other pulmonary embolism without acute cor pulmonale: Secondary | ICD-10-CM

## 2012-03-14 DIAGNOSIS — Z7901 Long term (current) use of anticoagulants: Secondary | ICD-10-CM

## 2012-03-14 LAB — POCT INR: INR: 1.9

## 2012-04-02 ENCOUNTER — Other Ambulatory Visit: Payer: Self-pay | Admitting: *Deleted

## 2012-04-02 MED ORDER — WARFARIN SODIUM 2.5 MG PO TABS: 2.5000 mg | ORAL_TABLET | ORAL | Status: DC

## 2012-04-04 ENCOUNTER — Ambulatory Visit (INDEPENDENT_AMBULATORY_CARE_PROVIDER_SITE_OTHER): Payer: Medicare Other | Admitting: *Deleted

## 2012-04-04 DIAGNOSIS — I2699 Other pulmonary embolism without acute cor pulmonale: Secondary | ICD-10-CM

## 2012-04-04 DIAGNOSIS — Z7901 Long term (current) use of anticoagulants: Secondary | ICD-10-CM

## 2012-04-04 LAB — POCT INR: INR: 3.1

## 2012-05-02 ENCOUNTER — Ambulatory Visit (INDEPENDENT_AMBULATORY_CARE_PROVIDER_SITE_OTHER): Payer: Medicare Other | Admitting: Pharmacist

## 2012-05-02 DIAGNOSIS — I2699 Other pulmonary embolism without acute cor pulmonale: Secondary | ICD-10-CM

## 2012-05-02 DIAGNOSIS — Z7901 Long term (current) use of anticoagulants: Secondary | ICD-10-CM

## 2012-05-30 ENCOUNTER — Ambulatory Visit (INDEPENDENT_AMBULATORY_CARE_PROVIDER_SITE_OTHER): Payer: Medicare Other | Admitting: Pharmacist

## 2012-05-30 DIAGNOSIS — I2699 Other pulmonary embolism without acute cor pulmonale: Secondary | ICD-10-CM

## 2012-05-30 DIAGNOSIS — Z7901 Long term (current) use of anticoagulants: Secondary | ICD-10-CM

## 2012-05-30 LAB — POCT INR: INR: 1.8

## 2012-06-13 ENCOUNTER — Other Ambulatory Visit: Payer: Medicare Other

## 2012-06-20 ENCOUNTER — Ambulatory Visit (INDEPENDENT_AMBULATORY_CARE_PROVIDER_SITE_OTHER)
Admission: RE | Admit: 2012-06-20 | Discharge: 2012-06-20 | Disposition: A | Discharge status: Home / Self Care | Payer: Medicare Other | Source: Ambulatory Visit | Attending: Pulmonary Disease | Admitting: Pulmonary Disease

## 2012-06-20 ENCOUNTER — Ambulatory Visit (INDEPENDENT_AMBULATORY_CARE_PROVIDER_SITE_OTHER): Payer: Medicare Other | Admitting: *Deleted

## 2012-06-20 DIAGNOSIS — I2699 Other pulmonary embolism without acute cor pulmonale: Secondary | ICD-10-CM

## 2012-06-20 DIAGNOSIS — Z7901 Long term (current) use of anticoagulants: Secondary | ICD-10-CM

## 2012-06-20 DIAGNOSIS — R911 Solitary pulmonary nodule: Secondary | ICD-10-CM

## 2012-06-21 ENCOUNTER — Telehealth: Payer: Self-pay | Admitting: Pulmonary Disease

## 2012-06-21 NOTE — Telephone Encounter (Signed)
06/20/2012 *RADIOLOGY REPORT*  Clinical Data: Follow-up lung nodule  CT CHEST WITHOUT CONTRAST  Technique: Multidetector CT imaging of the chest was performed following the standard protocol without IV contrast.  Comparison: 12/21/2011 and 03/31/2006  Findings: There are small bilateral lung nodules that are stable from the most recent prior exam. The largest nodule lies in the right lower lobe posterior laterally measuring just under 8 mm. Allowing for slight differences in measurement technique, all of these nodules are stable in size. There are no new lung nodules. To with three of the small nodules were present on the 2007 exam. Others are new from that study. The lungs are otherwise clear. The heart is normal in size. There are dense coronary artery calcifications. Mildly prominent reactive mediastinal lymph nodes are noted which are stable. No hilar masses or adenopathy. Low density liver lesions are stable consistent with cysts. There is chronic perirenal stranding. The limited visualization of the upper abdomen is otherwise unremarkable. Degenerative changes of the thoracic spine. No osteoblastic or osteolytic lesions.  IMPRESSION: Stable small bilateral pulmonary nodules. No new nodules. No acute findings. Follow-up chest CT without contrast in 12-18 months is recommended to document longer term stability. This is based on the [place] criteria described on the prior report.  Original Report Authenticated By: Amie Portland, M.D.    Will have my nurse schedule ROV to review results.

## 2012-06-21 NOTE — Telephone Encounter (Signed)
  06/20/2012  *RADIOLOGY REPORT*   Clinical Data: Follow-up lung nodule   CT CHEST WITHOUT CONTRAST   Technique:  Multidetector CT imaging of the chest was performed following the standard protocol without IV contrast.   Comparison: 12/21/2011 and 03/31/2006   Findings: There are small bilateral lung nodules that are stable from the most recent prior exam.  The largest nodule lies in the right lower lobe posterior laterally measuring just under 8 mm. Allowing for slight differences in measurement technique, all of these nodules are stable in size.  There are no new lung nodules. To with three of the small nodules were present on the 2007 exam. Others are new from that study.  The lungs are otherwise clear.  The heart is normal in size.  There are dense coronary artery calcifications.  Mildly prominent reactive mediastinal lymph nodes are noted which are stable.  No hilar masses or adenopathy.  Low density liver lesions are stable consistent with cysts.  There is chronic perirenal stranding.  The limited visualization of the upper abdomen is otherwise unremarkable.  Degenerative changes of the thoracic spine.  No osteoblastic or osteolytic lesions.   IMPRESSION: Stable small bilateral pulmonary nodules.  No new nodules.  No acute findings.  Follow-up chest CT without contrast in 12-18 months is recommended to document longer term stability.  This is based on the [place] criteria described on the prior report.    Original Report Authenticated By: Amie Portland, M.D.     Will have my nurse schedule ROV to review results.

## 2012-06-22 NOTE — Telephone Encounter (Signed)
lmomtcb x1 for pt 

## 2012-06-25 NOTE — Telephone Encounter (Signed)
Pt is scheduled to see VS

## 2012-06-29 ENCOUNTER — Encounter: Payer: Self-pay | Admitting: Pulmonary Disease

## 2012-06-29 ENCOUNTER — Ambulatory Visit (INDEPENDENT_AMBULATORY_CARE_PROVIDER_SITE_OTHER): Payer: Medicare Other | Admitting: Pulmonary Disease

## 2012-06-29 VITALS — BP 140/76 | HR 68 | Temp 98.3°F | Ht 65.0 in | Wt 248.0 lb

## 2012-06-29 DIAGNOSIS — R911 Solitary pulmonary nodule: Secondary | ICD-10-CM

## 2012-06-29 NOTE — Assessment & Plan Note (Signed)
Nodules are stable.  Will f/u CT chest w/o contrast for December 2014.

## 2012-06-29 NOTE — Patient Instructions (Signed)
Will schedule CT chest for December 2014 Follow up in December 2014 after CT chest

## 2012-06-29 NOTE — Progress Notes (Signed)
Chief Complaint  Patient presents with  . Follow-up    Discuss CT results    History of Present Illness: Aaron [name] is a 66 y.o. male former smoker pulmonary nodule.  He is here to review his CT chest.  He denies cough, wheeze, sputum, or hemoptysis.  He does not feel his breathing limits his activity.   TESTS: 06/08/11 CT chest>>b/l PE, ATX, LUL 11mm nodule  12/11/11 CT chest>>LUL nodule resolved, but new b/l nodules up to 8 mm 06/20/12 CT chest>>no change  Past Medical History  Diagnosis Date  . Coronary artery disease     DES LAD 2005  . Hypertension   . Hyperlipidemia   . Depression   . GERD (gastroesophageal reflux disease)   . Insomnia   . Sleep apnea, obstructive   . Pulmonary embolism ~ 03/2006    bilaterally  . Osteoarthritis   . PONV (postoperative nausea and vomiting)   . Shortness of breath 06/08/11    "w/exertion; that's why I'm here"  . Heart murmur   . Blood transfusion     "@ birth"  . Anemia     "@ birth"  . 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 PTSD (post-traumatic stress disorder)     Past Surgical History  Procedure Date  . Spinal injections 2006-2007    "maybe 10 injections for herniated discs"  . Carpel tunnel release ~ 06/2005/~ 08/2005    right/left  . Coronary stent placement 02/13/2004    1  . Inguinal hernia repair 1983  . Inguinal hernia repair 1985  . Tonsillectomy     "when I was real young"  . Eye surgery     age 43; "ruptured  right pupil"  . Eye surgery     age 66  . Enucleation     age 5  . Eye surgery 1957    placed artificial right eye  . Cardiac catheterization   . Knee arthroscopy ~ 06/2005    right  . Coronary angioplasty     Outpatient Encounter Prescriptions as of 06/29/2012  Medication Sig Dispense Refill  . ALPRAZolam (XANAX) 0.5 MG tablet Take 1 mg by mouth 3 (three) times daily as needed. As needed for sleep.      . Ascorbic Acid (VITAMIN C) 1000 MG tablet Take 1,000 mg by  mouth daily.        Marland Kitchen aspirin 81 MG tablet Take 81 mg by mouth every evening.       . docusate sodium (COLACE) 100 MG capsule Take 100 mg by mouth 2 (two) times a week. Sometimes he takes 4 times weekly if needed for constipation.      . fenofibrate micronized (LOFIBRA) 200 MG capsule Take 200 mg by mouth daily before breakfast.        . Glucosamine-Chondroitin 750-600 MG TABS Take 1 tablet by mouth daily.        Marland Kitchen lisinopril-hydrochlorothiazide (PRINZIDE,ZESTORETIC) 20-12.5 MG per tablet Take 2 tablets by mouth every morning.       . meloxicam (MOBIC) 15 MG tablet Take 15 mg by mouth daily.       . Multiple Vitamin (MULTIVITAMIN) tablet Take 1 tablet by mouth daily.        Marland Kitchen omeprazole (PRILOSEC) 20 MG capsule Take 20 mg by mouth daily.        . polyethylene glycol powder (MIRALAX) powder Take 17 g by mouth daily.       Marland Kitchen  simvastatin (ZOCOR) 40 MG tablet Take 40 mg by mouth at bedtime.        . traMADol (ULTRAM) 50 MG tablet Take 50 mg by mouth every 6 (six) hours as needed.        . venlafaxine (EFFEXOR-XR) 37.5 MG 24 hr capsule Take 37.5 mg by mouth daily.       Marland Kitchen warfarin (COUMADIN) 2.5 MG tablet Take 1 tablet (2.5 mg total) by mouth as directed. Take as directed per coumadin clinic  30 tablet  3    Allergies  Allergen Reactions  . Amoxicillin     Rapid heart rate.     Physical Exam:  Filed Vitals:   06/29/12 1616  BP: 140/76  Pulse: 68  Temp: 98.3 F (36.8 C)  TempSrc: Oral  Height: 5\' 5"  (1.651 m)  Weight: 248 lb (112.492 kg)  SpO2: 97%     Current Encounter SPO2  06/29/12 1616 97%  01/17/12 1109 96%  07/20/11 1121 99%     Body mass index is 41.27 kg/(m^2).   Wt Readings from Last 2 Encounters:  06/29/12 248 lb (112.492 kg)  01/17/12 245 lb 3.2 oz (111.222 kg)     General - No distress ENT - artificial Rt eye, no sinus tenderness, no oral exudate, no LAN Cardiac - s1s2 regular, no murmur Chest - No wheeze/rales/dullness Back - No focal tenderness Abd -  Soft, non-tender Ext - No edema Neuro - Normal strength Skin - No rashes Psych - normal mood, and behavior   Ct Chest Wo Contrast  06/20/2012  *RADIOLOGY REPORT*   Clinical Data: Follow-up lung nodule   CT CHEST WITHOUT CONTRAST   Technique:  Multidetector CT imaging of the chest was performed following the standard protocol without IV contrast.   Comparison: 12/21/2011 and 03/31/2006   Findings: There are small bilateral lung nodules that are stable from the most recent prior exam.  The largest nodule lies in the right lower lobe posterior laterally measuring just under 8 mm. Allowing for slight differences in measurement technique, all of these nodules are stable in size.  There are no new lung nodules. To with three of the small nodules were present on the 2007 exam. Others are new from that study.  The lungs are otherwise clear.  The heart is normal in size.  There are dense coronary artery calcifications.  Mildly prominent reactive mediastinal lymph nodes are noted which are stable.  No hilar masses or adenopathy.  Low density liver lesions are stable consistent with cysts.  There is chronic perirenal stranding.  The limited visualization of the upper abdomen is otherwise unremarkable.  Degenerative changes of the thoracic spine.  No osteoblastic or osteolytic lesions.   IMPRESSION: Stable small bilateral pulmonary nodules.  No new nodules.  No acute findings.   Follow-up chest CT without contrast in 12-18 months is recommended to document longer term stability.  This is based on the [place] criteria described on the prior report.    Original Report Authenticated By: Amie Portland, M.D.     Assessment/Plan:  Coralyn Helling, MD [facility] Pager:  [phone] 06/29/2012, 4:20 PM

## 2012-07-18 ENCOUNTER — Ambulatory Visit (INDEPENDENT_AMBULATORY_CARE_PROVIDER_SITE_OTHER): Payer: Medicare Other | Admitting: *Deleted

## 2012-07-18 DIAGNOSIS — Z7901 Long term (current) use of anticoagulants: Secondary | ICD-10-CM

## 2012-07-18 DIAGNOSIS — I2699 Other pulmonary embolism without acute cor pulmonale: Secondary | ICD-10-CM

## 2012-07-18 LAB — POCT INR: INR: 2.5

## 2012-07-26 ENCOUNTER — Ambulatory Visit: Payer: Medicare Other | Admitting: Pulmonary Disease

## 2012-08-09 ENCOUNTER — Other Ambulatory Visit: Payer: Self-pay

## 2012-08-09 MED ORDER — WARFARIN SODIUM 2.5 MG PO TABS: ORAL_TABLET | ORAL | Status: DC

## 2012-08-22 ENCOUNTER — Ambulatory Visit (INDEPENDENT_AMBULATORY_CARE_PROVIDER_SITE_OTHER): Payer: Medicare Other | Admitting: *Deleted

## 2012-08-22 DIAGNOSIS — Z7901 Long term (current) use of anticoagulants: Secondary | ICD-10-CM

## 2012-08-22 DIAGNOSIS — I2699 Other pulmonary embolism without acute cor pulmonale: Secondary | ICD-10-CM

## 2012-08-22 LAB — POCT INR: INR: 3.2

## 2012-08-27 ENCOUNTER — Ambulatory Visit: Payer: Self-pay | Admitting: Cardiovascular Disease

## 2012-08-27 DIAGNOSIS — I2699 Other pulmonary embolism without acute cor pulmonale: Secondary | ICD-10-CM

## 2012-08-27 DIAGNOSIS — Z7901 Long term (current) use of anticoagulants: Secondary | ICD-10-CM

## 2012-11-07 ENCOUNTER — Encounter: Payer: Self-pay | Admitting: Nurse Practitioner

## 2012-11-16 ENCOUNTER — Telehealth: Payer: Self-pay | Admitting: Cardiology

## 2012-11-16 NOTE — Telephone Encounter (Signed)
New problem    Pt last saw dr Swaziland for an ov on 11/02/10-per pts wife tamika starkes,pa @ [facility] pts labwork showed pt has 43% blockage-she wanted to get the pt in soon but could not find anything avail w/pa or dr Swaziland that was timely for pts wife

## 2012-11-16 NOTE — Telephone Encounter (Signed)
Spoke with pt's wife who reports pt had blood work drawn recently as part of his routine physical. Pt's wife received call from [place] today stating blood work showed pt has 43% chance of heart issue and should make appt with Dr. Swaziland to discuss.  Wife states PA told her this was not a critical finding or MD would have called her. Wife reports pt did not have any testing other than lab work done.  She reports pt is feeling well and not having any problems.  She is requesting sooner appt with Dr. Swaziland than first available. Will forward to [facility] to see if earlier appt available. Wife states lab work is being sent to our office

## 2012-11-19 NOTE — Telephone Encounter (Signed)
Returned call to patient's wife she stated husband had a recent gene test done at his PCP.States results not normal may indicate a heart problem.Wife request appointment.Appointment scheduled with Norma Fredrickson NP 11/21/12.Wife will bring copy of test results.

## 2012-11-21 ENCOUNTER — Encounter: Payer: Self-pay | Admitting: Nurse Practitioner

## 2012-11-21 ENCOUNTER — Ambulatory Visit (INDEPENDENT_AMBULATORY_CARE_PROVIDER_SITE_OTHER): Payer: Medicare Other | Admitting: Nurse Practitioner

## 2012-11-21 VITALS — BP 132/70 | HR 64 | Ht 64.5 in | Wt 246.8 lb

## 2012-11-21 DIAGNOSIS — I259 Chronic ischemic heart disease, unspecified: Secondary | ICD-10-CM

## 2012-11-21 NOTE — Patient Instructions (Signed)
We will arrange for a stress test (Lexiscan)  Try to work on losing weight and increasing your aerobic activity  We will see you back in one year  Call the [facility] office at [phone] if you have any questions, problems or concerns.

## 2012-11-21 NOTE — Progress Notes (Signed)
Aaron [name] Date of Birth: [birth year] Medical Record #[id number]  History of Present Illness: Mr. Tauzin is seen back today for a work in visit. Seen for Dr. Swaziland. Has known CD with stenting of the mid LAD in August of 2005 with Cypher stent for a 70% lesion. Nuclear study had showed evidence of anteroapical ischemia. Other issues include HLD, HTN, morbid obesity, depression, GERD, OSA. Has had 2 occurences of pulmonary emboli and is on life long coumadin.   Last seen here in May of 2012 and was referred for stress testing at that time.   Has had some labs from [facility].Had alab test that showed a chance of a heart issue. He has not had symptoms clinically. Wanted to make an appointment to discuss and was thus added to my schedule today.   Comes back today. Here with his wife. He is doing ok clinically. Some occasional shortness of breath. No chest pain. Not exercising. Now pre diabetic. His A1C was 6.3. He had a Corus gene expression test in which he scored 32% - with a 43% likelihood of obstructive CAD. He has known CAD with a prior stent.   Current Outpatient Prescriptions on File Prior to Visit  Medication Sig Dispense Refill  . ALPRAZolam (XANAX) 0.5 MG tablet Take 1 mg by mouth 3 (three) times daily as needed. As needed for sleep.      . Ascorbic Acid (VITAMIN C) 1000 MG tablet Take 1,000 mg by mouth daily.        Marland Kitchen aspirin 81 MG tablet Take 81 mg by mouth every evening.       . docusate sodium (COLACE) 100 MG capsule Take 100 mg by mouth 2 (two) times a week. Sometimes he takes 4 times weekly if needed for constipation.      . fenofibrate micronized (LOFIBRA) 200 MG capsule Take 200 mg by mouth daily before breakfast.        . Glucosamine-Chondroitin 750-600 MG TABS Take 1 tablet by mouth daily.        . meloxicam (MOBIC) 15 MG tablet Take 15 mg by mouth daily.       . Multiple Vitamin (MULTIVITAMIN) tablet Take 1 tablet by mouth daily.        Marland Kitchen omeprazole  (PRILOSEC) 20 MG capsule Take 20 mg by mouth daily.        . polyethylene glycol powder (MIRALAX) powder Take 17 g by mouth daily.       . simvastatin (ZOCOR) 40 MG tablet Take 40 mg by mouth at bedtime.        . traMADol (ULTRAM) 50 MG tablet Take 50 mg by mouth every 6 (six) hours as needed.        . venlafaxine (EFFEXOR-XR) 37.5 MG 24 hr capsule Take 37.5 mg by mouth daily.       Marland Kitchen warfarin (COUMADIN) 2.5 MG tablet Take as directed per coumadin clinic  30 tablet  3   No current facility-administered medications on file prior to visit.    Allergies  Allergen Reactions  . Amoxicillin     Rapid heart rate.     Past Medical History  Diagnosis Date  . Coronary artery disease     DES LAD 2005  . Hypertension   . Hyperlipidemia   . Depression   . GERD (gastroesophageal reflux disease)   . Insomnia   . Sleep apnea, obstructive   . Pulmonary embolism ~ 03/2006    bilaterally  . Osteoarthritis   .  PONV (postoperative nausea and vomiting)   . Shortness of breath 06/08/11    "w/exertion; that's why I'm here"  . Heart murmur   . Blood transfusion     "@ birth"  . Anemia     "@ birth"  . 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 PTSD (post-traumatic stress disorder)     Past Surgical History  Procedure Laterality Date  . Spinal injections  2006-2007    "maybe 10 injections for herniated discs"  . Carpel tunnel release  ~ 06/2005/~ 08/2005    right/left  . Coronary stent placement  02/13/2004    1  . Inguinal hernia repair  1983  . Inguinal hernia repair  1985  . Tonsillectomy      "when I was real young"  . Eye surgery      age 67; "ruptured  right pupil"  . Eye surgery      age 20  . Enucleation      age 37  . Eye surgery  1957    placed artificial right eye  . Cardiac catheterization    . Knee arthroscopy  ~ 06/2005    right  . Coronary angioplasty      History  Smoking status  . Former Smoker -- 1.50 packs/day for 25 years  . Types:  Cigarettes  . Quit date: 07/10/1995  Smokeless tobacco  . Never Used    History  Alcohol Use  . Yes    Comment: "maybe a beer once a year"    Family History  Problem Relation Age of Onset  . Breast cancer Sister   . Liver cancer Sister   . Asthma Father   . Asthma Sister   . Asthma Brother   . Emphysema Father     Review of Systems: The review of systems is per the HPI.  All other systems were reviewed and are negative.  Physical Exam: BP 132/70  Pulse 64  Ht 5' 4.5" (1.638 m)  Wt 246 lb 12.8 oz (111.948 kg)  BMI 41.72 kg/m2 Patient is pleasant and in no acute distress. He is morbidly obese.  Skin is warm and dry. Color is normal.  HEENT is unremarkable. Normocephalic/atraumatic. PERRL. Sclera are nonicteric. Neck is supple. No masses. No JVD. Lungs are clear. Cardiac exam shows a regular rate and rhythm. Abdomen is soft. Extremities are without edema. Gait and ROM are intact. No gross neurologic deficits noted.  LABORATORY DATA: Reviewed.  Lab Results  Component Value Date   WBC 8.0 06/12/2011   HGB 11.1* 06/12/2011   HCT 31.8* 06/12/2011   PLT 212 06/12/2011   GLUCOSE 93 12/14/2011   CHOL 177 06/09/2011   TRIG 147 06/09/2011   HDL 42 06/09/2011   LDLCALC 106* 06/09/2011   NA 138 12/14/2011   K 4.1 12/14/2011   CL 102 12/14/2011   CREATININE 1.2 12/14/2011   BUN 22 12/14/2011   CO2 29 12/14/2011   TSH 2.539 06/08/2011   INR 3.2 08/22/2012   HGBA1C 6.0* 06/08/2011   Myoview Impression from 2012 Exercise Capacity: Lexiscan with no exercise.  BP Response: Normal blood pressure response.  Clinical Symptoms: Short of breath.  ECG Impression: No significant ST segment change suggestive of ischemia.  Comparison with Prior Nuclear Study: No images to compare  Overall Impression: Normal stress nuclear study.  Dalton McLean    Assessment / Plan: 1. CAD - no active symptoms. Does have multiple risk factors -  now pre diabetic. Will arrange for follow up stress testing.  Tentatively see Dr. Swaziland back in one year. This Corus gene test was done in the setting of persons without MI or past revascularization. This really does not apply to him. But in light of his multiple CV risk factors and known CAD, he does warrant periodic evaluation/testing. He is not able to walk on the treadmill due to knee pain - will arrange for Lexiscan.   2. HTN - blood pressure looks ok.   3. HLD on therapy  4. Obesity - needs to work on weight loss and increasing aerobic activities.   Patient is agreeable to this plan and will call if any problems develop in the interim.   Rosalio Macadamia, RN, ANP-C [facility] [address] [place], [place]  [address]

## 2012-11-29 ENCOUNTER — Encounter: Payer: Self-pay | Admitting: Cardiothoracic Surgery

## 2012-12-04 ENCOUNTER — Ambulatory Visit (HOSPITAL_COMMUNITY): Payer: Medicare Other | Attending: Cardiology | Admitting: Radiology

## 2012-12-04 VITALS — BP 141/78 | Ht 64.5 in | Wt 245.0 lb

## 2012-12-04 DIAGNOSIS — R0989 Other specified symptoms and signs involving the circulatory and respiratory systems: Secondary | ICD-10-CM | POA: Insufficient documentation

## 2012-12-04 DIAGNOSIS — Z87891 Personal history of nicotine dependence: Secondary | ICD-10-CM | POA: Insufficient documentation

## 2012-12-04 DIAGNOSIS — I10 Essential (primary) hypertension: Secondary | ICD-10-CM | POA: Insufficient documentation

## 2012-12-04 DIAGNOSIS — E785 Hyperlipidemia, unspecified: Secondary | ICD-10-CM | POA: Insufficient documentation

## 2012-12-04 DIAGNOSIS — Z86711 Personal history of pulmonary embolism: Secondary | ICD-10-CM | POA: Insufficient documentation

## 2012-12-04 DIAGNOSIS — R5381 Other malaise: Secondary | ICD-10-CM | POA: Insufficient documentation

## 2012-12-04 DIAGNOSIS — I259 Chronic ischemic heart disease, unspecified: Secondary | ICD-10-CM

## 2012-12-04 DIAGNOSIS — R0609 Other forms of dyspnea: Secondary | ICD-10-CM | POA: Insufficient documentation

## 2012-12-04 DIAGNOSIS — I2510 Atherosclerotic heart disease of native coronary artery without angina pectoris: Secondary | ICD-10-CM

## 2012-12-04 DIAGNOSIS — R0602 Shortness of breath: Secondary | ICD-10-CM

## 2012-12-04 DIAGNOSIS — J45909 Unspecified asthma, uncomplicated: Secondary | ICD-10-CM | POA: Insufficient documentation

## 2012-12-04 DIAGNOSIS — R5383 Other fatigue: Secondary | ICD-10-CM | POA: Insufficient documentation

## 2012-12-04 MED ORDER — REGADENOSON 0.4 MG/5ML IV SOLN
0.4000 mg | Freq: Once | INTRAVENOUS | Status: AC
  Administered 2012-12-04: 0.4 mg via INTRAVENOUS

## 2012-12-04 MED ORDER — TECHNETIUM TC 99M SESTAMIBI GENERIC - CARDIOLITE
33.0000 | Freq: Once | INTRAVENOUS | Status: AC | PRN
  Administered 2012-12-04: 33 via INTRAVENOUS

## 2012-12-04 MED ORDER — TECHNETIUM TC 99M SESTAMIBI GENERIC - CARDIOLITE
11.0000 | Freq: Once | INTRAVENOUS | Status: AC | PRN
  Administered 2012-12-04: 11 via INTRAVENOUS

## 2012-12-04 NOTE — Progress Notes (Signed)
[facility] SITE 3 NUCLEAR MED [address] [place], [place] [address] [phone]    Cardiology Nuclear Med Study  Aaron [name] is a 67 y.o. male     MRN : [id number]     DOB: [birth year]  Procedure Date: 12/04/2012  Nuclear Med Background Indication for Stress Test:  Evaluation for Ischemia and Stent Patency History:  Asthma and '05 Heart Cath: EF: 55%-Stent LAD, ' EF:59% (-) ischemia ECHO: No EF H/O PE x2  Corus gene testing-32%with 42% likelihood of CAD Cardiac Risk Factors: History of Smoking, Hypertension and Lipids  Symptoms:  DOE, Fatigue and SOB   Nuclear Pre-Procedure Caffeine/Decaff Intake:  None > 12 hrs NPO After: 7:30pm   Lungs:  clear O2 Sat: 98% on room air. IV 0.9% NS with Angio Cath:  22g  IV Site: L Antecubital x 1, tolerated well IV Started by:  Irean Hong, RN  Chest Size (in):  44 Cup Size: n/a  Height: 5' 4.5" (1.638 m)  Weight:  245 lb (111.131 kg)  BMI:  Body mass index is 41.42 kg/(m^2). Tech Comments:  Took medications this am    Nuclear Med Study 1 or 2 day study: 1 day  Stress Test Type:  Treadmill/Lexiscan  Reading MD: Willa Rough, MD  Order Authorizing Provider:  Peter Swaziland, MD, and Norma Fredrickson, NP  Resting Radionuclide: Technetium 81m Sestamibi  Resting Radionuclide Dose: 11.0 mCi   Stress Radionuclide:  Technetium 21m Sestamibi  Stress Radionuclide Dose: 33.0 mCi           Stress Protocol Rest HR: 54 Stress HR: 102  Rest BP: 141/78 Stress BP: 196/65  Exercise Time (min): n/a METS: n/a   Predicted Max HR: 153 bpm % Max HR: 66.67 bpm Rate Pressure Product: 21308   Dose of Adenosine (mg):  n/a Dose of Lexiscan: 0.4 mg  Dose of Atropine (mg): n/a Dose of Dobutamine: n/a mcg/kg/min (at max HR)  Stress Test Technologist: Milana Na, EMT-P  Nuclear Technologist:  Domenic Polite, CNMT     Rest Procedure:  Myocardial perfusion imaging was performed at rest 45 minutes following the intravenous administration of  Technetium 21m Sestamibi. Rest ECG: Sinus bradycardia with normal QRS  Stress Procedure:  The patient received IV Lexiscan 0.4 mg over 15-seconds with concurrent low level exercise and then Technetium 50m Sestamibi was injected at 30-seconds while the patient continued walking one more minute. This patient had sob and knee pain with the Lexiscan injection. Quantitative spect images were obtained after a 45-minute delay. Stress ECG: No significant change from baseline ECG  QPS Raw Data Images:  Normal; no motion artifact; normal heart/lung ratio. Stress Images:  Normal homogeneous uptake in all areas of the myocardium. Rest Images:  Normal homogeneous uptake in all areas of the myocardium. Subtraction (SDS):  No evidence of ischemia. Transient Ischemic Dilatation (Normal <1.22):  1.04 Lung/Heart Ratio (Normal <0.45):  0.39  Quantitative Gated Spect Images QGS EDV:  133 ml QGS ESV:  54 ml  Impression Exercise Capacity:  Lexiscan with low level exercise. BP Response:  Normal blood pressure response. Clinical Symptoms:  shortness of breath ECG Impression:  No significant ST segment change suggestive of ischemia. Comparison with Prior Nuclear Study: No significant change from previous study, May, 2012  Overall Impression:  Normal stress nuclear study. There is no scar or ischemia. This is a low risk scan.  LV Ejection Fraction: 60%.  LV Wall Motion:  Normal Wall Motion.  Willa Rough, MD

## 2013-03-02 ENCOUNTER — Other Ambulatory Visit: Payer: Self-pay | Admitting: Cardiology

## 2013-03-06 ENCOUNTER — Other Ambulatory Visit: Payer: Self-pay | Admitting: Cardiology

## 2013-06-20 ENCOUNTER — Ambulatory Visit (INDEPENDENT_AMBULATORY_CARE_PROVIDER_SITE_OTHER)
Admission: RE | Admit: 2013-06-20 | Discharge: 2013-06-20 | Disposition: A | Discharge status: Home / Self Care | Payer: Medicare Other | Source: Ambulatory Visit | Attending: Pulmonary Disease | Admitting: Pulmonary Disease

## 2013-06-20 DIAGNOSIS — R911 Solitary pulmonary nodule: Secondary | ICD-10-CM

## 2013-07-09 ENCOUNTER — Ambulatory Visit (INDEPENDENT_AMBULATORY_CARE_PROVIDER_SITE_OTHER): Payer: Medicare Other | Admitting: Pulmonary Disease

## 2013-07-09 ENCOUNTER — Encounter: Payer: Self-pay | Admitting: Pulmonary Disease

## 2013-07-09 VITALS — BP 150/80 | HR 68 | Ht 66.0 in | Wt 251.0 lb

## 2013-07-09 DIAGNOSIS — R911 Solitary pulmonary nodule: Secondary | ICD-10-CM

## 2013-07-09 DIAGNOSIS — G4733 Obstructive sleep apnea (adult) (pediatric): Secondary | ICD-10-CM

## 2013-07-09 NOTE — Assessment & Plan Note (Signed)
He has snoring, sleep disruption, daytime sleepiness, and witnessed apnea.  He has trouble with his CPAP set up.  He would like to have further assessment of this and requested pulmonary to assume care of his sleep apnea.  Will arrange for repeat sleep study to further assess status of sleep apnea, and then make adjustments to his set up from there.

## 2013-07-09 NOTE — Assessment & Plan Note (Signed)
This is stable.  Will need repeat CT chest in December 2016.

## 2013-07-09 NOTE — Progress Notes (Signed)
Chief Complaint  Patient presents with  . Pulmonary Nodule    Breathing is doing well. Reports DOE. Denies chest tightness, coughing or wheezing.    History of Present Illness: Aaron [name] is a 68 y.o. male former smoker pulmonary nodule, and sleep apnea.  He is here to review his CT chest.  He was diagnosed with sleep apnea several years ago, and has been on CPAP.  This helped initially, but recently he has noticed more trouble with his sleep and feeling sleepy during the day.  He would like further evaluation for his sleep apnea also.  He does snore, and wakes up choking if he does not use his CPAP.  His machine is > 85 years old.  He has not received new supplies for a while.    TESTS: 06/08/11 CT chest >> b/l PE, ATX, LUL 48mm nodule  12/11/11 CT chest >> LUL nodule resolved, but new b/l nodules up to 8 mm 06/20/12 CT chest >> no change 06/20/13 CT chest >> no change  Past Medical History  Diagnosis Date  . Coronary artery disease     DES LAD 2005  . Hypertension   . Hyperlipidemia   . Depression   . GERD (gastroesophageal reflux disease)   . Insomnia   . Sleep apnea, obstructive   . Pulmonary embolism ~ 03/2006    bilaterally  . Osteoarthritis   . PONV (postoperative nausea and vomiting)   . Shortness of breath 06/08/11    "w/exertion; that's why I'm here"  . Heart murmur   . Blood transfusion     "@ birth"  . Anemia     "@ birth"  . 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 PTSD (post-traumatic stress disorder)     Past Surgical History  Procedure Laterality Date  . Spinal injections  2006-2007    "maybe 10 injections for herniated discs"  . Carpel tunnel release  ~ 06/2005/~ 08/2005    right/left  . Coronary stent placement  02/13/2004    1  . Inguinal hernia repair  1983  . Inguinal hernia repair  1985  . Tonsillectomy      "when I was real young"  . Eye surgery      age 18; "ruptured  right pupil"  . Eye surgery      age 19   . Enucleation      age 83  . Eye surgery  1957    placed artificial right eye  . Cardiac catheterization    . Knee arthroscopy  ~ 06/2005    right  . Coronary angioplasty      Outpatient Encounter Prescriptions as of 07/09/2013  Medication Sig  . ALPRAZolam (XANAX) 0.5 MG tablet Take 1 mg by mouth 3 (three) times daily as needed. As needed for sleep.  . Ascorbic Acid (VITAMIN C) 1000 MG tablet Take 1,000 mg by mouth daily.    Marland Kitchen aspirin 81 MG tablet Take 81 mg by mouth every evening.   . bisacodyl (BISACODYL) 5 MG EC tablet Take 5 mg by mouth daily as needed for constipation.  . fenofibrate micronized (LOFIBRA) 200 MG capsule Take 200 mg by mouth daily before breakfast.    . Glucosamine-Chondroitin 750-600 MG TABS Take 1 tablet by mouth daily.    Marland Kitchen losartan-hydrochlorothiazide (HYZAAR) 100-25 MG per tablet Take 1 tablet by mouth daily.  . meloxicam (MOBIC) 15 MG tablet Take 15 mg by mouth daily.   Marland Kitchen  omeprazole (PRILOSEC) 20 MG capsule Take 20 mg by mouth daily.    . polyethylene glycol powder (MIRALAX) powder Take 17 g by mouth daily.   . psyllium (METAMUCIL) 58.6 % packet Take 1 packet by mouth daily.  . Rivaroxaban (XARELTO) 20 MG TABS tablet Take 20 mg by mouth daily with supper.  . simvastatin (ZOCOR) 40 MG tablet Take 40 mg by mouth at bedtime.    . traMADol (ULTRAM) 50 MG tablet Take 50 mg by mouth every 6 (six) hours as needed.    . venlafaxine (EFFEXOR-XR) 37.5 MG 24 hr capsule Take 37.5 mg by mouth daily.   . [DISCONTINUED] docusate sodium (COLACE) 100 MG capsule Take 100 mg by mouth 2 (two) times a week. Sometimes he takes 4 times weekly if needed for constipation.  . [DISCONTINUED] Multiple Vitamin (MULTIVITAMIN) tablet Take 1 tablet by mouth daily.    . [DISCONTINUED] warfarin (COUMADIN) 2.5 MG tablet Take as directed per coumadin clinic    Allergies  Allergen Reactions  . Amoxicillin     Rapid heart rate.     Physical Exam:  General - No distress ENT - artificial  Rt eye, no sinus tenderness, no oral exudate, no LAN Cardiac - s1s2 regular, no murmur Chest - No wheeze/rales/dullness Back - No focal tenderness Abd - Soft, non-tender Ext - No edema Neuro - Normal strength Skin - No rashes Psych - normal mood, and behavior   06/20/2013    EXAM: CT CHEST WITHOUT CONTRAST   TECHNIQUE: Multidetector CT imaging of the chest was performed following the standard protocol without IV contrast.   COMPARISON:  06/20/2012   FINDINGS:  Followup lung nodule no pleural effusion identified. There is no airspace consolidation or atelectasis. Right lower lobe pulmonary nodule is stable measuring 8 mm, image 38/series 3. Lower down is a 6 mm right lower lobe nodule, image 43/series 3. This is also unchanged from previous exam. In the left upper lobe there is a stable 7 mm nodule, image 35/series 3.  The trachea appears patent and is midline. There is no endobronchial lesion. The heart size is normal. Calcifications within the LAD and left circumflex coronary artery is identified. No mediastinal or hilar adenopathy identified. No pericardial effusion noted. No axillary or supraclavicular adenopathy.  Incidental imaging through the upper abdomen shows a low attenuation structure within the left hepatic lobe which likely represents a cyst. Several additional low-attenuation foci are too small to characterize. The gallbladder is normal. No biliary dilatation. Normal appearance of the adrenal glands. The spleen is negative.  Review of the visualized osseous structures is negative for aggressive lytic or sclerotic bone lesion. Spondylosis noted within the thoracic spine.   IMPRESSION:  1. The small pulmonary nodules have remained unchanged since 12/21/2011. This represents documented stability of approximately 18 months. This is a reassuring finding. If this patient is at low risk for lung cancer then no additional followup is necessary. In a patient that is at high risk for lung  cancer documented stability for 24 months is advised.  2. Coronary artery calcifications.    Electronically Signed   By: Kerby Moors M.D.   On: 06/20/2013 14:51   Assessment/Plan:  [name], MD [facility]/Sleep Pager:  [phone] 07/09/2013, 3:59 PM

## 2013-07-09 NOTE — Patient Instructions (Signed)
Will arrange for sleep study Will call to arrange for follow up after sleep study reviewed 

## 2013-08-05 ENCOUNTER — Encounter (HOSPITAL_BASED_OUTPATIENT_CLINIC_OR_DEPARTMENT_OTHER): Payer: Medicare Other

## 2013-08-22 ENCOUNTER — Encounter (HOSPITAL_BASED_OUTPATIENT_CLINIC_OR_DEPARTMENT_OTHER): Payer: Medicare Other

## 2013-09-22 ENCOUNTER — Ambulatory Visit (HOSPITAL_BASED_OUTPATIENT_CLINIC_OR_DEPARTMENT_OTHER): Payer: Medicare Other | Attending: Pulmonary Disease

## 2013-09-22 VITALS — Ht 66.0 in | Wt 247.0 lb

## 2013-09-22 DIAGNOSIS — G4733 Obstructive sleep apnea (adult) (pediatric): Secondary | ICD-10-CM | POA: Insufficient documentation

## 2013-09-22 DIAGNOSIS — R0609 Other forms of dyspnea: Secondary | ICD-10-CM | POA: Insufficient documentation

## 2013-09-22 DIAGNOSIS — R0989 Other specified symptoms and signs involving the circulatory and respiratory systems: Secondary | ICD-10-CM | POA: Insufficient documentation

## 2013-10-06 ENCOUNTER — Telehealth: Payer: Self-pay | Admitting: Pulmonary Disease

## 2013-10-06 DIAGNOSIS — G4733 Obstructive sleep apnea (adult) (pediatric): Secondary | ICD-10-CM

## 2013-10-06 NOTE — Sleep Study (Signed)
[place]  NAME: Aaron [name] DATE OF BIRTH:  02-Apr-1946 MEDICAL RECORD NUMBER [id number]  LOCATION: [facility]  PHYSICIAN: [name], M.D. DATE OF STUDY: 09/22/2013  SLEEP STUDY TYPE: Nocturnal Polysomnogram               REFERRING PHYSICIAN: [name], MD  INDICATION FOR STUDY:  68 year old male with history of sleep apnea.  He had difficulty tolerating CPAP.  He continues to have snoring, sleep disruption, and daytime sleepiness.  He returns to the sleep lab to further evaluate hypersomnia with obstructive sleep apnea.  EPWORTH SLEEPINESS SCORE: 10. HEIGHT: 5\' 6"  (167.6 cm)  WEIGHT: 112.038 kg (247 lb)    Body mass index is 39.89 kg/(m^2).  NECK SIZE: 18.5 in.  MEDICATIONS:  Current Outpatient Prescriptions on File Prior to Visit  Medication Sig Dispense Refill  . ALPRAZolam (XANAX) 0.5 MG tablet Take 1 mg by mouth 3 (three) times daily as needed. As needed for sleep.      . Ascorbic Acid (VITAMIN C) 1000 MG tablet Take 1,000 mg by mouth daily.        Marland Kitchen aspirin 81 MG tablet Take 81 mg by mouth every evening.       . bisacodyl (BISACODYL) 5 MG EC tablet Take 5 mg by mouth daily as needed for constipation.      . fenofibrate micronized (LOFIBRA) 200 MG capsule Take 200 mg by mouth daily before breakfast.        . Glucosamine-Chondroitin 750-600 MG TABS Take 1 tablet by mouth daily.        Marland Kitchen losartan-hydrochlorothiazide (HYZAAR) 100-25 MG per tablet Take 1 tablet by mouth daily.      . meloxicam (MOBIC) 15 MG tablet Take 15 mg by mouth daily.       Marland Kitchen omeprazole (PRILOSEC) 20 MG capsule Take 20 mg by mouth daily.        . polyethylene glycol powder (MIRALAX) powder Take 17 g by mouth daily.       . psyllium (METAMUCIL) 58.6 % packet Take 1 packet by mouth daily.      . Rivaroxaban (XARELTO) 20 MG TABS tablet Take 20 mg by mouth daily with supper.      . simvastatin (ZOCOR) 40 MG tablet Take 40 mg by mouth at bedtime.        .  traMADol (ULTRAM) 50 MG tablet Take 50 mg by mouth every 6 (six) hours as needed.        . venlafaxine (EFFEXOR-XR) 37.5 MG 24 hr capsule Take 37.5 mg by mouth daily.        No current facility-administered medications on file prior to visit.    SLEEP ARCHITECTURE:  Total recording time: 380 minutes.  Total sleep time was: 287.5 minutes.  Sleep efficiency: 75.7%.  Sleep latency: 13 minutes.  REM latency: N/A.  Stage N1: 11.3%.  Stage N2: 88.7%.  Stage N3: 0%.  Stage R:  0%.  Supine sleep: 270 minutes.  Non-supine sleep: 17.5 minutes.  RESPIRATORY DATA: Average respiratory rate: 60. Snoring: Moderate. Average AHI: 5.  Average RDI 61.4.   Apnea index: 0.  Hypopnea index: 4.8. Obstructive apnea index: 0.  Central apnea index: 0.  Mixed apnea index: 0. REM AHI: NA.  NREM AHI: 5. Supine AHI: 4. Non-supine AHI: 0.  OXYGEN DATA:  Baseline oxygenation: 94%. Lowest SaO2: 90%. Time spent below SaO2 90%: 0 minutes. Supplemental oxygen used: None.  CARDIAC DATA:  Average heart rate: 60 beats per minute. Rhythm strip: Sinus rhythm.  MOVEMENT/PARASOMNIA:  Periodic limb movement: 0.  Period limb movements with arousals: 0. Restroom trips: One.  IMPRESSION/ RECOMMENDATION:   This study shows mild obstructive sleep apnea with AHI of 5, and SaO2 low of 90%.  Of note is that his RDI was 61.4.   Additional therapies include weight loss, CPAP, oral appliance, or surgical evaluation.   [name], M.D. Diplomate, Tax adviser of Sleep Medicine  ELECTRONICALLY SIGNED ON:  10/06/2013, 5:22 PM [place] PH: [phone]   FX: [phone] [place]

## 2013-10-06 NOTE — Telephone Encounter (Signed)
PSG 09/22/13 >> AHI 5, RDI 61.4, SaO2 low 90%.  Will have my nurse schedule ROV to review results.

## 2013-10-07 NOTE — Telephone Encounter (Signed)
lmtcb x1 

## 2013-10-07 NOTE — Telephone Encounter (Signed)
Pt's spouse returned call.  Set appt w/ VS on 4/27 @ 4:00 PM for ROV to review results.  Satira Anis

## 2013-10-07 NOTE — Telephone Encounter (Signed)
Nothing further is needed. 

## 2013-10-28 ENCOUNTER — Ambulatory Visit (INDEPENDENT_AMBULATORY_CARE_PROVIDER_SITE_OTHER): Payer: Medicare Other | Admitting: Pulmonary Disease

## 2013-10-28 ENCOUNTER — Encounter: Payer: Self-pay | Admitting: Pulmonary Disease

## 2013-10-28 VITALS — BP 140/70 | HR 70 | Ht 66.0 in | Wt 253.0 lb

## 2013-10-28 DIAGNOSIS — G4733 Obstructive sleep apnea (adult) (pediatric): Secondary | ICD-10-CM

## 2013-10-28 DIAGNOSIS — R911 Solitary pulmonary nodule: Secondary | ICD-10-CM

## 2013-10-28 NOTE — Progress Notes (Signed)
Chief Complaint  Patient presents with  . Sleep Apnea    review sleep study results    History of Present Illness: Aaron [name] is a 68 y.o. male former smoker with OSA and pulmonary nodules.  He is here to review his sleep study.  His current CPAP machine is quite old.  He also uses nasal mask, but this does not fit well.  He also gets dry mouth.  He continues to have trouble with his sleep.  He also found copy of CT chest from 01/15/04 >> he had similar appearance to pulmonary nodules compared to more recent CT chest.   TESTS: 06/08/11 CT chest >> b/l PE, ATX, LUL 104mm nodule  12/11/11 CT chest >> LUL nodule resolved, but new b/l nodules up to 8 mm  06/20/12 CT chest >> no change  06/20/13 CT chest >> no change PSG 09/22/13 >> AHI 5, RDI 61.4, SaO2 low 90%.   Aaron [name]  has a past medical history of Coronary artery disease; Hypertension; Hyperlipidemia; Depression; GERD (gastroesophageal reflux disease); Insomnia; Sleep apnea, obstructive; Pulmonary embolism (~ 03/2006); Osteoarthritis; PONV (postoperative nausea and vomiting); Shortness of breath (06/08/11); Heart murmur; Blood transfusion; Anemia; Headache(784.0); and PTSD (post-traumatic stress disorder).  Aaron [name]  has past surgical history that includes spinal injections (2006-2007); carpel tunnel release (~ 06/2005/~ 08/2005); Coronary stent placement (02/13/2004); Inguinal hernia repair (1983); Inguinal hernia repair (1985); Tonsillectomy; Eye surgery; Eye surgery; Enucleation; Eye surgery [phone]); Cardiac catheterization; Knee arthroscopy (~ 06/2005); and Coronary angioplasty.  Prior to Admission medications   Medication Sig Start Date End Date Taking? Authorizing Provider  ALPRAZolam Duanne Moron) 0.5 MG tablet Take 0.5 mg by mouth 3 (three) times daily as needed. As needed for sleep.   Yes Historical Provider, MD  Ascorbic Acid (VITAMIN C) 1000 MG tablet Take 1,000 mg by mouth daily.     Yes Historical Provider, MD   aspirin 81 MG tablet Take 81 mg by mouth every evening.    Yes Historical Provider, MD  bisacodyl (BISACODYL) 5 MG EC tablet Take 5 mg by mouth daily as needed for constipation.   Yes Historical Provider, MD  fenofibrate micronized (LOFIBRA) 200 MG capsule Take 200 mg by mouth daily before breakfast.     Yes Historical Provider, MD  Glucosamine-Chondroitin 750-600 MG TABS Take 1 tablet by mouth daily.     Yes Historical Provider, MD  losartan-hydrochlorothiazide (HYZAAR) 100-25 MG per tablet Take 1 tablet by mouth daily.   Yes Historical Provider, MD  meloxicam (MOBIC) 15 MG tablet Take 15 mg by mouth daily.    Yes Historical Provider, MD  omeprazole (PRILOSEC) 20 MG capsule Take 20 mg by mouth daily.     Yes Historical Provider, MD  polyethylene glycol powder (MIRALAX) powder Take 17 g by mouth daily.    Yes Historical Provider, MD  psyllium (METAMUCIL) 58.6 % packet Take 1 packet by mouth daily.   Yes Historical Provider, MD  Rivaroxaban (XARELTO) 20 MG TABS tablet Take 20 mg by mouth daily with supper.   Yes Historical Provider, MD  simvastatin (ZOCOR) 40 MG tablet Take 40 mg by mouth at bedtime.     Yes Historical Provider, MD  traMADol (ULTRAM) 50 MG tablet Take 50 mg by mouth every 6 (six) hours as needed.     Yes Historical Provider, MD  venlafaxine (EFFEXOR-XR) 37.5 MG 24 hr capsule Take 37.5 mg by mouth daily.    Yes Historical Provider, MD    Allergies  Allergen  Reactions  . Amoxicillin     Rapid heart rate.      Physical Exam:  General - No distress ENT - No sinus tenderness, no oral exudate, no LAN Cardiac - s1s2 regular, no murmur Chest - No wheeze/rales/dullness Back - No focal tenderness Abd - Soft, non-tender Ext - No edema Neuro - Normal strength Skin - No rashes Psych - normal mood, and behavior   Assessment/Plan:  [name], MD [facility]/Sleep Pager:  [phone]

## 2013-10-28 NOTE — Patient Instructions (Signed)
Will arrange for new CPAP machine  Follow up in 2 months 

## 2013-10-29 ENCOUNTER — Encounter: Payer: Self-pay | Admitting: Pulmonary Disease

## 2013-10-29 NOTE — Assessment & Plan Note (Signed)
Reviewed his sleep study results.  Based on RDI he has severe sleep apnea.  We discussed how sleep apnea can affect various health problems including risks for hypertension, cardiovascular disease, and diabetes.  We also discussed how sleep disruption can increase risks for accident, such as while driving.  Weight loss as a means of improving sleep apnea was also reviewed.  Additional treatment options discussed were CPAP therapy, oral appliance, and surgical intervention.  Will arrange for him to get new auto CPAP machine.  He preferred mask he used in sleep lab >> F/P Simplus small size full face mask.

## 2013-10-29 NOTE — Assessment & Plan Note (Signed)
He had pulmonary nodules first noted in 2005.  These have been stable since, and likely are benign.  He does not need additional radiographic follow up for these.

## 2013-11-21 ENCOUNTER — Ambulatory Visit (INDEPENDENT_AMBULATORY_CARE_PROVIDER_SITE_OTHER): Payer: Medicare Other | Admitting: Cardiology

## 2013-11-21 ENCOUNTER — Encounter: Payer: Self-pay | Admitting: Cardiology

## 2013-11-21 VITALS — BP 130/60 | HR 67 | Ht 66.5 in | Wt 251.0 lb

## 2013-11-21 DIAGNOSIS — I10 Essential (primary) hypertension: Secondary | ICD-10-CM

## 2013-11-21 DIAGNOSIS — I2510 Atherosclerotic heart disease of native coronary artery without angina pectoris: Secondary | ICD-10-CM

## 2013-11-21 DIAGNOSIS — E785 Hyperlipidemia, unspecified: Secondary | ICD-10-CM

## 2013-11-21 DIAGNOSIS — I2699 Other pulmonary embolism without acute cor pulmonale: Secondary | ICD-10-CM

## 2013-11-21 DIAGNOSIS — Z7901 Long term (current) use of anticoagulants: Secondary | ICD-10-CM

## 2013-11-21 NOTE — Patient Instructions (Signed)
Continue your current therapy  I will see you in one year   

## 2013-11-21 NOTE — Progress Notes (Signed)
Aaron [name] Date of Birth: [birth year] Medical Record #[id number]  History of Present Illness: Aaron [name] is seen for follow up today. Has known CD with stenting of the mid LAD in August of 2005 with Cypher stent for a 70% lesion. Nuclear study had showed evidence of anteroapical ischemia. Myoview in June 2014 was normal. Other issues include HLD, HTN, morbid obesity, depression, GERD, OSA. Has had 2 occurences of pulmonary emboli and is on anticoagulation with Xarelto. On follow up today he is feeling very well. Has made significant changes in his diet and feels much better. He does have a lot of low back pain especially working in his yard. He likes Xarelto much better than Coumadin but states it is too expensive and is going to put him in the doughnut hole early. Asks about patient assistance.   Current Outpatient Prescriptions on File Prior to Visit  Medication Sig Dispense Refill  . ALPRAZolam (XANAX) 0.5 MG tablet Take 0.5 mg by mouth 3 (three) times daily as needed. As needed for sleep.      . Ascorbic Acid (VITAMIN C) 1000 MG tablet Take 1,000 mg by mouth daily.        Marland Kitchen aspirin 81 MG tablet Take 81 mg by mouth every evening.       . bisacodyl (BISACODYL) 5 MG EC tablet Take 5 mg by mouth daily as needed for constipation.      . fenofibrate micronized (LOFIBRA) 200 MG capsule Take 200 mg by mouth daily before breakfast.        . Glucosamine-Chondroitin 750-600 MG TABS Take 1 tablet by mouth daily.        Marland Kitchen losartan-hydrochlorothiazide (HYZAAR) 100-25 MG per tablet Take 1 tablet by mouth daily.      . meloxicam (MOBIC) 15 MG tablet Take 15 mg by mouth daily.       Marland Kitchen omeprazole (PRILOSEC) 20 MG capsule Take 20 mg by mouth daily.        . polyethylene glycol powder (MIRALAX) powder Take 17 g by mouth daily.       . psyllium (METAMUCIL) 58.6 % packet Take 1 packet by mouth daily.      . Rivaroxaban (XARELTO) 20 MG TABS tablet Take 20 mg by mouth daily with supper.      . simvastatin  (ZOCOR) 40 MG tablet Take 40 mg by mouth at bedtime.        . traMADol (ULTRAM) 50 MG tablet Take 50 mg by mouth every 6 (six) hours as needed.        . venlafaxine (EFFEXOR-XR) 37.5 MG 24 hr capsule Take 37.5 mg by mouth daily.        No current facility-administered medications on file prior to visit.    Allergies  Allergen Reactions  . Amoxicillin     Rapid heart rate.     Past Medical History  Diagnosis Date  . Coronary artery disease     DES LAD 2005  . Hypertension   . Hyperlipidemia   . Depression   . GERD (gastroesophageal reflux disease)   . Insomnia   . Sleep apnea, obstructive   . Pulmonary embolism ~ 03/2006    bilaterally  . Osteoarthritis   . PONV (postoperative nausea and vomiting)   . Shortness of breath 06/08/11    "w/exertion; that's why I'm here"  . Heart murmur   . Blood transfusion     "@ birth"  . Anemia     "@ birth"  .  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 PTSD (post-traumatic stress disorder)     Past Surgical History  Procedure Laterality Date  . Spinal injections  2006-2007    "maybe 10 injections for herniated discs"  . Carpel tunnel release  ~ 06/2005/~ 08/2005    right/left  . Coronary stent placement  02/13/2004    1  . Inguinal hernia repair  1983  . Inguinal hernia repair  1985  . Tonsillectomy      "when I was real young"  . Eye surgery      age 59; "ruptured  right pupil"  . Eye surgery      age 30  . Enucleation      age 52  . Eye surgery  1957    placed artificial right eye  . Cardiac catheterization    . Knee arthroscopy  ~ 06/2005    right  . Coronary angioplasty      History  Smoking status  . Former Smoker -- 1.50 packs/day for 25 years  . Types: Cigarettes  . Quit date: 07/10/1995  Smokeless tobacco  . Never Used    History  Alcohol Use  . Yes    Comment: "maybe a beer once a year"    Family History  Problem Relation Age of Onset  . Breast cancer Sister   . Liver  cancer Sister   . Asthma Father   . Asthma Sister   . Asthma Brother   . Emphysema Father     Review of Systems: The review of systems is per the HPI.  All other systems were reviewed and are negative.  Physical Exam: BP 130/60  Pulse 67  Ht 5' 6.5" (1.689 m)  Wt 251 lb (113.853 kg)  BMI 39.91 kg/m2 Patient is pleasant and in no acute distress. He is morbidly obese.  Skin is warm and dry. Color is normal.  HEENT is unremarkable. Normocephalic/atraumatic. PERRL. Sclera are nonicteric. Neck is supple. No masses. No JVD. Lungs are clear. Cardiac exam shows a regular rate and rhythm. Abdomen is soft. Extremities are without edema. Gait and ROM are intact. No gross neurologic deficits noted.  LABORATORY DATA: Reviewed.  Lab Results  Component Value Date   WBC 8.0 06/12/2011   HGB 11.1* 06/12/2011   HCT 31.8* 06/12/2011   PLT 212 06/12/2011   GLUCOSE 93 12/14/2011   CHOL 177 06/09/2011   TRIG 147 06/09/2011   HDL 42 06/09/2011   LDLCALC 106* 06/09/2011   NA 138 12/14/2011   K 4.1 12/14/2011   CL 102 12/14/2011   CREATININE 1.2 12/14/2011   BUN 22 12/14/2011   CO2 29 12/14/2011   TSH 2.539 06/08/2011   INR 3.2 08/22/2012   HGBA1C 6.0* 06/08/2011   Ecg: NSR rate 67 bpm. Normal.   Cardiology Nuclear Med Study  Aaron [name] is a 68 y.o. male MRN : [id number] DOB: January 21, 1946  Procedure Date: 12/04/2012  Nuclear Med Background  Indication for Stress Test: Evaluation for Ischemia and Stent Patency  History: Asthma and '05 Heart Cath: EF: 55%-Stent LAD, '12MPS EF:59% (-) ischemia ECHO: No EF H/O PE x2 Corus gene testing-32%with 42% likelihood of CAD  Cardiac Risk Factors: History of Smoking, Hypertension and Lipids  Symptoms: DOE, Fatigue and SOB  Nuclear Pre-Procedure  Caffeine/Decaff Intake: None > 12 hrs  NPO After: 7:30pm   Lungs: clear  O2 Sat: 98% on room air.  IV 0.9% NS with Angio  Cath: 22g   IV Site: L Antecubital x 1, tolerated well  IV Started by: Irven Baltimore, RN   Chest Size  (in): 44  Cup Size: n/a   Height: 5' 4.5" (1.638 m)  Weight: 245 lb (111.131 kg)   BMI: Body mass index is 41.42 kg/(m^2).  Tech Comments: Took medications this am   Nuclear Med Study  1 or 2 day study: 1 day  Stress Test Type: Treadmill/Lexiscan   Reading MD: Dola Argyle, MD  Order Authorizing Provider: Peter Martinique, MD, and Truitt Merle, NP   Resting Radionuclide: Technetium 62m Sestamibi  Resting Radionuclide Dose: 11.0 mCi   Stress Radionuclide: Technetium 37m Sestamibi  Stress Radionuclide Dose: 33.0 mCi   Stress Protocol  Rest HR: 54  Stress HR: 102   Rest BP: 141/78  Stress BP: 196/65   Exercise Time (min): n/a  METS: n/a   Predicted Max HR: 153 bpm  % Max HR: 66.67 bpm  Rate Pressure Product: 19992  Dose of Adenosine (mg): n/a  Dose of Lexiscan: 0.4 mg   Dose of Atropine (mg): n/a  Dose of Dobutamine: n/a mcg/kg/min (at max HR)   Stress Test Technologist: Perrin Maltese, EMT-P  Nuclear Technologist: Charlton Amor, CNMT   Rest Procedure: Myocardial perfusion imaging was performed at rest 45 minutes following the intravenous administration of Technetium 57m Sestamibi.  Rest ECG: Sinus bradycardia with normal QRS  Stress Procedure: The patient received IV Lexiscan 0.4 mg over 15-seconds with concurrent low level exercise and then Technetium 69m Sestamibi was injected at 30-seconds while the patient continued walking one more minute. This patient had sob and knee pain with the Lexiscan injection. Quantitative spect images were obtained after a 45-minute delay.  Stress ECG: No significant change from baseline ECG  QPS  Raw Data Images: Normal; no motion artifact; normal heart/lung ratio.  Stress Images: Normal homogeneous uptake in all areas of the myocardium.  Rest Images: Normal homogeneous uptake in all areas of the myocardium.  Subtraction (SDS): No evidence of ischemia.  Transient Ischemic Dilatation (Normal <1.22): 1.04  Lung/Heart Ratio (Normal <0.45): 0.39  Quantitative  Gated Spect Images  QGS EDV: 133 ml  QGS ESV: 54 ml  Impression  Exercise Capacity: Lexiscan with low level exercise.  BP Response: Normal blood pressure response.  Clinical Symptoms: shortness of breath  ECG Impression: No significant ST segment change suggestive of ischemia.  Comparison with Prior Nuclear Study: No significant change from previous study, May, 2012  Overall Impression: Normal stress nuclear study. There is no scar or ischemia. This is a low risk scan.  LV Ejection Fraction: 60%. LV Wall Motion: Normal Wall Motion.  Dola Argyle, MD      Assessment / Plan: 1. CAD - s/p stent of the LAD in 2005. No active symptoms. Asymptomatic. Normal Myoview last year. Continue medical Rx.  2. HTN - blood pressure is controlled.  3. HLD on therapy  4. Obesity - needs to work on weight loss and increasing aerobic activities.   5. Recurrent PEs. On anticoagulation. Will check and see if he qualifies for patient assistance for Xarelto. If not he made need to switch back to coumadin due to cost.  I will follow up in one year.

## 2013-11-27 ENCOUNTER — Telehealth: Payer: Self-pay | Admitting: *Deleted

## 2013-11-27 NOTE — Telephone Encounter (Signed)
Mailed patient assistance forms to from [place] and [place] for [employer].

## 2013-11-27 NOTE — Telephone Encounter (Signed)
Message copied by Fernande Boyden on Wed Nov 27, 2013  4:25 PM ------      Message from: Golden Hurter D      Created: Thu Nov 21, 2013  4:45 PM       Patient needs help with Xarelto.He wants to know if he qualifies for patient assistance program.                  Thanks Malachy Mood ------

## 2013-11-28 NOTE — Telephone Encounter (Signed)
Faxed provider information and prescription to [facility] and [employer]

## 2013-12-12 NOTE — Telephone Encounter (Signed)
12/12/2013 8am, Recevied and faxed patient assistance application completed for Aaron [name] and Aaron [name] to assist with xarelto, refaxed the completed provider portion with script.

## 2013-12-30 ENCOUNTER — Encounter: Payer: Self-pay | Admitting: Pulmonary Disease

## 2013-12-30 ENCOUNTER — Ambulatory Visit (INDEPENDENT_AMBULATORY_CARE_PROVIDER_SITE_OTHER): Payer: Medicare Other | Admitting: Pulmonary Disease

## 2013-12-30 VITALS — BP 122/80 | HR 65 | Ht 66.0 in | Wt 251.4 lb

## 2013-12-30 DIAGNOSIS — G4733 Obstructive sleep apnea (adult) (pediatric): Secondary | ICD-10-CM

## 2013-12-30 NOTE — Patient Instructions (Signed)
Follow up in 1 year.

## 2013-12-30 NOTE — Progress Notes (Signed)
Chief Complaint  Patient presents with  . Follow-up    Wears CPAP nightly. Pt reports using also when napping. Denies problems with mask/pressure. Using 2 different masks-- full face and nasal pillows.    History of Present Illness: Aaron [name] is a 68 y.o. male former smoker with OSA.  He has been doing well with CPAP.  He is sleeping better, and feels rested during the day.  He sometimes get irritation from his mask.  He does not snore when using CPAP.  TESTS: 06/08/11 CT chest >> b/l PE, ATX, LUL 110mm nodule  12/11/11 CT chest >> LUL nodule resolved, but new b/l nodules up to 8 mm  06/20/12 CT chest >> no change  06/20/13 CT chest >> no change PSG 09/22/13 >> AHI 5, RDI 61.4, SaO2 low 90%. Auto CPAP 11/30/13 to 12/29/13 >> used on 30 of 30 nights with average 9 hrs 4 min.  Average AHI 0.7 with median CPAP 8 cm H2O and 95 th percentile CPAP 10 cm H2O  Aaron [name]  has a past medical history of Coronary artery disease; Hypertension; Hyperlipidemia; Depression; GERD (gastroesophageal reflux disease); Insomnia; Sleep apnea, obstructive; Pulmonary embolism (~ 03/2006); Osteoarthritis; PONV (postoperative nausea and vomiting); Shortness of breath (06/08/11); Heart murmur; Blood transfusion; Anemia; Headache(784.0); and PTSD (post-traumatic stress disorder).  Aaron [name]  has past surgical history that includes spinal injections (2006-2007); carpel tunnel release (~ 06/2005/~ 08/2005); Coronary stent placement (02/13/2004); Inguinal hernia repair (1983); Inguinal hernia repair (1985); Tonsillectomy; Eye surgery; Eye surgery; Enucleation; Eye surgery [phone]); Cardiac catheterization; Knee arthroscopy (~ 06/2005); and Coronary angioplasty.  Prior to Admission medications   Medication Sig Start Date End Date Taking? Authorizing Provider  ALPRAZolam Duanne Moron) 0.5 MG tablet Take 0.5 mg by mouth 3 (three) times daily as needed. As needed for sleep.   Yes Historical Provider, MD  Ascorbic Acid (VITAMIN  C) 1000 MG tablet Take 1,000 mg by mouth daily.     Yes Historical Provider, MD  aspirin 81 MG tablet Take 81 mg by mouth every evening.    Yes Historical Provider, MD  bisacodyl (BISACODYL) 5 MG EC tablet Take 5 mg by mouth daily as needed for constipation.   Yes Historical Provider, MD  fenofibrate micronized (LOFIBRA) 200 MG capsule Take 200 mg by mouth daily before breakfast.     Yes Historical Provider, MD  Glucosamine-Chondroitin 750-600 MG TABS Take 1 tablet by mouth daily.     Yes Historical Provider, MD  losartan-hydrochlorothiazide (HYZAAR) 100-25 MG per tablet Take 1 tablet by mouth daily.   Yes Historical Provider, MD  meloxicam (MOBIC) 15 MG tablet Take 15 mg by mouth daily.    Yes Historical Provider, MD  omeprazole (PRILOSEC) 20 MG capsule Take 20 mg by mouth daily.     Yes Historical Provider, MD  polyethylene glycol powder (MIRALAX) powder Take 17 g by mouth daily.    Yes Historical Provider, MD  psyllium (METAMUCIL) 58.6 % packet Take 1 packet by mouth daily.   Yes Historical Provider, MD  Rivaroxaban (XARELTO) 20 MG TABS tablet Take 20 mg by mouth daily with supper.   Yes Historical Provider, MD  simvastatin (ZOCOR) 40 MG tablet Take 40 mg by mouth at bedtime.     Yes Historical Provider, MD  traMADol (ULTRAM) 50 MG tablet Take 50 mg by mouth every 6 (six) hours as needed.     Yes Historical Provider, MD  venlafaxine (EFFEXOR-XR) 37.5 MG 24 hr capsule Take 37.5 mg by  mouth daily.    Yes Historical Provider, MD    Allergies  Allergen Reactions  . Amoxicillin     Rapid heart rate.      Physical Exam:  General - No distress ENT - No sinus tenderness, no oral exudate, no LAN Cardiac - s1s2 regular, no murmur Chest - No wheeze/rales/dullness Back - No focal tenderness Abd - Soft, non-tender Ext - No edema Neuro - Normal strength Skin - No rashes Psych - normal mood, and behavior   Assessment/Plan:  Aaron Mires, MD [facility] Pager:   [phone]

## 2014-01-09 NOTE — Assessment & Plan Note (Signed)
He is compliant with CPAP and reports benefit.  Advised him to d/w his DME if has issues with mask fit or irritation.

## 2014-01-31 ENCOUNTER — Other Ambulatory Visit: Payer: Self-pay | Admitting: Family

## 2014-01-31 DIAGNOSIS — I739 Peripheral vascular disease, unspecified: Secondary | ICD-10-CM

## 2014-02-06 ENCOUNTER — Ambulatory Visit
Admission: RE | Admit: 2014-02-06 | Discharge: 2014-02-06 | Disposition: A | Discharge status: Home / Self Care | Payer: Medicare Other | Source: Ambulatory Visit | Attending: Family | Admitting: Family

## 2014-02-06 DIAGNOSIS — I739 Peripheral vascular disease, unspecified: Secondary | ICD-10-CM

## 2014-02-20 ENCOUNTER — Telehealth: Payer: Self-pay | Admitting: Pulmonary Disease

## 2014-02-20 NOTE — Telephone Encounter (Signed)
Pt returned call. Informed him the OV note with VS was faxed to [place] and to call back if he is having any other issues. Pt verbalized understanding and denied any further questions or concerns at this time. Nothing further needed.

## 2014-02-20 NOTE — Telephone Encounter (Signed)
Called and spoke to [place] with [facility]. Caryl Pina stated the pt's account is pending and all that is needed is his most recent OV note and to fax it to [phone] attn: Caryl Pina. OV note faxed to [facility].   LMTCB for pt to inform him of the above.

## 2014-04-03 ENCOUNTER — Other Ambulatory Visit: Payer: Self-pay | Admitting: Optometry

## 2014-07-15 DIAGNOSIS — G4733 Obstructive sleep apnea (adult) (pediatric): Secondary | ICD-10-CM | POA: Diagnosis not present

## 2014-07-30 DIAGNOSIS — R7309 Other abnormal glucose: Secondary | ICD-10-CM | POA: Diagnosis not present

## 2014-07-30 DIAGNOSIS — Z79899 Other long term (current) drug therapy: Secondary | ICD-10-CM | POA: Diagnosis not present

## 2014-07-30 DIAGNOSIS — E780 Pure hypercholesterolemia: Secondary | ICD-10-CM | POA: Diagnosis not present

## 2014-07-30 DIAGNOSIS — E782 Mixed hyperlipidemia: Secondary | ICD-10-CM | POA: Diagnosis not present

## 2014-07-30 DIAGNOSIS — I10 Essential (primary) hypertension: Secondary | ICD-10-CM | POA: Diagnosis not present

## 2014-07-30 DIAGNOSIS — I4891 Unspecified atrial fibrillation: Secondary | ICD-10-CM | POA: Diagnosis not present

## 2014-07-30 DIAGNOSIS — Z5181 Encounter for therapeutic drug level monitoring: Secondary | ICD-10-CM | POA: Diagnosis not present

## 2014-07-31 DIAGNOSIS — R7309 Other abnormal glucose: Secondary | ICD-10-CM | POA: Diagnosis not present

## 2014-08-15 DIAGNOSIS — G4733 Obstructive sleep apnea (adult) (pediatric): Secondary | ICD-10-CM | POA: Diagnosis not present

## 2014-09-03 DIAGNOSIS — G4733 Obstructive sleep apnea (adult) (pediatric): Secondary | ICD-10-CM | POA: Diagnosis not present

## 2014-09-10 DIAGNOSIS — E669 Obesity, unspecified: Secondary | ICD-10-CM | POA: Diagnosis not present

## 2014-09-10 DIAGNOSIS — R509 Fever, unspecified: Secondary | ICD-10-CM | POA: Diagnosis not present

## 2014-09-10 DIAGNOSIS — I10 Essential (primary) hypertension: Secondary | ICD-10-CM | POA: Diagnosis not present

## 2014-09-10 DIAGNOSIS — R609 Edema, unspecified: Secondary | ICD-10-CM | POA: Diagnosis not present

## 2014-09-13 DIAGNOSIS — G4733 Obstructive sleep apnea (adult) (pediatric): Secondary | ICD-10-CM | POA: Diagnosis not present

## 2014-09-25 DIAGNOSIS — I129 Hypertensive chronic kidney disease with stage 1 through stage 4 chronic kidney disease, or unspecified chronic kidney disease: Secondary | ICD-10-CM | POA: Diagnosis not present

## 2014-09-25 DIAGNOSIS — N182 Chronic kidney disease, stage 2 (mild): Secondary | ICD-10-CM | POA: Diagnosis not present

## 2014-09-25 DIAGNOSIS — I4891 Unspecified atrial fibrillation: Secondary | ICD-10-CM | POA: Diagnosis not present

## 2014-09-25 DIAGNOSIS — R946 Abnormal results of thyroid function studies: Secondary | ICD-10-CM | POA: Diagnosis not present

## 2014-09-25 DIAGNOSIS — R7309 Other abnormal glucose: Secondary | ICD-10-CM | POA: Diagnosis not present

## 2014-10-14 DIAGNOSIS — G4733 Obstructive sleep apnea (adult) (pediatric): Secondary | ICD-10-CM | POA: Diagnosis not present

## 2014-10-20 DIAGNOSIS — H10021 Other mucopurulent conjunctivitis, right eye: Secondary | ICD-10-CM | POA: Diagnosis not present

## 2014-10-24 DIAGNOSIS — H00022 Hordeolum internum right lower eyelid: Secondary | ICD-10-CM | POA: Diagnosis not present

## 2014-10-24 DIAGNOSIS — H10021 Other mucopurulent conjunctivitis, right eye: Secondary | ICD-10-CM | POA: Diagnosis not present

## 2014-11-07 DIAGNOSIS — H10021 Other mucopurulent conjunctivitis, right eye: Secondary | ICD-10-CM | POA: Diagnosis not present

## 2014-11-07 DIAGNOSIS — H00022 Hordeolum internum right lower eyelid: Secondary | ICD-10-CM | POA: Diagnosis not present

## 2014-11-25 DIAGNOSIS — L918 Other hypertrophic disorders of the skin: Secondary | ICD-10-CM | POA: Diagnosis not present

## 2014-11-25 DIAGNOSIS — C44319 Basal cell carcinoma of skin of other parts of face: Secondary | ICD-10-CM | POA: Diagnosis not present

## 2014-11-25 DIAGNOSIS — L919 Hypertrophic disorder of the skin, unspecified: Secondary | ICD-10-CM | POA: Diagnosis not present

## 2014-11-27 ENCOUNTER — Ambulatory Visit (INDEPENDENT_AMBULATORY_CARE_PROVIDER_SITE_OTHER): Payer: Medicare Other | Admitting: Cardiology

## 2014-11-27 ENCOUNTER — Encounter: Payer: Self-pay | Admitting: Cardiology

## 2014-11-27 VITALS — BP 146/70 | HR 74 | Ht 65.0 in | Wt 259.5 lb

## 2014-11-27 DIAGNOSIS — E785 Hyperlipidemia, unspecified: Secondary | ICD-10-CM | POA: Diagnosis not present

## 2014-11-27 DIAGNOSIS — I2510 Atherosclerotic heart disease of native coronary artery without angina pectoris: Secondary | ICD-10-CM | POA: Diagnosis not present

## 2014-11-27 DIAGNOSIS — I10 Essential (primary) hypertension: Secondary | ICD-10-CM

## 2014-11-27 DIAGNOSIS — I2699 Other pulmonary embolism without acute cor pulmonale: Secondary | ICD-10-CM | POA: Diagnosis not present

## 2014-11-27 MED ORDER — ATORVASTATIN CALCIUM 40 MG PO TABS: 40.0000 mg | ORAL_TABLET | Freq: Every day | ORAL | Status: DC

## 2014-11-27 NOTE — Progress Notes (Signed)
Aaron [name] Date of Birth: July 17, 1945 Medical Record #[id number]  History of Present Illness: Aaron [name] is seen for follow up today. Has known CD with stenting of the mid LAD in August of 2005 with Cypher stent for a 70% lesion. Nuclear study had showed evidence of anteroapical ischemia. Myoview in June 2014 was normal. Other issues include HLD, HTN, morbid obesity, depression, GERD, OSA. Has had 2 occurences of pulmonary emboli and is on anticoagulation with Xarelto. On follow up today he is feeling very well. He is concerned about the care he has received from his primary care office- little continuity and miscommunication. Wants referral to another provider.   Current Outpatient Prescriptions on File Prior to Visit  Medication Sig Dispense Refill  . ALPRAZolam (XANAX) 0.5 MG tablet Take 0.5 mg by mouth 3 (three) times daily as needed. As needed for sleep.    . bisacodyl (BISACODYL) 5 MG EC tablet Take 5 mg by mouth daily as needed for constipation.    . fenofibrate micronized (LOFIBRA) 200 MG capsule Take 200 mg by mouth daily before breakfast.      . losartan-hydrochlorothiazide (HYZAAR) 100-25 MG per tablet Take 1 tablet by mouth daily.    Marland Kitchen omeprazole (PRILOSEC) 20 MG capsule Take 20 mg by mouth daily.      . polyethylene glycol powder (MIRALAX) powder Take 17 g by mouth daily.     . Rivaroxaban (XARELTO) 20 MG TABS tablet Take 20 mg by mouth daily with supper.    . traMADol (ULTRAM) 50 MG tablet Take 50 mg by mouth every 6 (six) hours as needed.      . venlafaxine (EFFEXOR-XR) 37.5 MG 24 hr capsule Take 37.5 mg by mouth daily.      No current facility-administered medications on file prior to visit.    Allergies  Allergen Reactions  . Amoxicillin     Rapid heart rate.     Past Medical History  Diagnosis Date  . Coronary artery disease     DES LAD 2005  . Hypertension   . Hyperlipidemia   . Depression   . GERD (gastroesophageal reflux disease)   . Insomnia   . Sleep  apnea, obstructive   . Pulmonary embolism ~ 03/2006    bilaterally  . Osteoarthritis   . PONV (postoperative nausea and vomiting)   . Shortness of breath 06/08/11    "w/exertion; that's why I'm here"  . Heart murmur   . Blood transfusion     "@ birth"  . Anemia     "@ birth"  . 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 PTSD (post-traumatic stress disorder)     Past Surgical History  Procedure Laterality Date  . Spinal injections  2006-2007    "maybe 10 injections for herniated discs"  . Carpel tunnel release  ~ 06/2005/~ 08/2005    right/left  . Coronary stent placement  02/13/2004    1  . Inguinal hernia repair  1983  . Inguinal hernia repair  1985  . Tonsillectomy      "when I was real young"  . Eye surgery      age 77; "ruptured  right pupil"  . Eye surgery      age 72  . Enucleation      age 67  . Eye surgery  1957    placed artificial right eye  . Cardiac catheterization    . Knee arthroscopy  ~ 06/2005  right  . Coronary angioplasty      History  Smoking status  . Former Smoker -- 1.50 packs/day for 25 years  . Types: Cigarettes  . Quit date: 07/10/1995  Smokeless tobacco  . Never Used    History  Alcohol Use  . Yes    Comment: "maybe a beer once a year"    Family History  Problem Relation Age of Onset  . Breast cancer Sister   . Liver cancer Sister   . Asthma Father   . Asthma Sister   . Asthma Brother   . Emphysema Father     Review of Systems: The review of systems is per the HPI.  All other systems were reviewed and are negative.  Physical Exam: BP 146/70 mmHg  Pulse 74  Ht 5\' 5"  (1.651 m)  Wt 117.708 kg (259 lb 8 oz)  BMI 43.18 kg/m2 Patient is pleasant and in no acute distress. He is morbidly obese.  Skin is warm and dry. Color is normal.  HEENT is unremarkable. Normocephalic/atraumatic. PERRL. Sclera are nonicteric. Neck is supple. No masses. No JVD. Lungs are clear. Cardiac exam shows a regular rate and  rhythm. Abdomen is soft. Extremities are without edema. Gait and ROM are intact. No gross neurologic deficits noted.  LABORATORY DATA:   Lab Results  Component Value Date   WBC 8.0 06/12/2011   HGB 11.1* 06/12/2011   HCT 31.8* 06/12/2011   PLT 212 06/12/2011   GLUCOSE 93 12/14/2011   CHOL 177 06/09/2011   TRIG 147 06/09/2011   HDL 42 06/09/2011   LDLCALC 106* 06/09/2011   NA 138 12/14/2011   K 4.1 12/14/2011   CL 102 12/14/2011   CREATININE 1.2 12/14/2011   BUN 22 12/14/2011   CO2 29 12/14/2011   TSH 2.539 06/08/2011   INR 3.2 08/22/2012   HGBA1C 6.0* 06/08/2011   SEG:BTDVV  NSR rate 74bpm. Normal. I have personally reviewed and interpreted this study.    Cardiology Nuclear Med Study  Aaron [name] is a 69 y.o. male MRN : [id number] DOB: July 02, 1946  Procedure Date: 12/04/2012  Nuclear Med Background  Indication for Stress Test: Evaluation for Ischemia and Stent Patency  History: Asthma and '05 Heart Cath: EF: 55%-Stent LAD, '12MPS EF:59% (-) ischemia ECHO: No EF H/O PE x2 Corus gene testing-32%with 42% likelihood of CAD  Cardiac Risk Factors: History of Smoking, Hypertension and Lipids  Symptoms: DOE, Fatigue and SOB  Nuclear Pre-Procedure  Caffeine/Decaff Intake: None > 12 hrs  NPO After: 7:30pm   Lungs: clear  O2 Sat: 98% on room air.  IV 0.9% NS with Angio Cath: 22g   IV Site: L Antecubital x 1, tolerated well  IV Started by: Irven Baltimore, RN   Chest Size (in): 44  Cup Size: n/a   Height: 5' 4.5" (1.638 m)  Weight: 245 lb (111.131 kg)   BMI: Body mass index is 41.42 kg/(m^2).  Tech Comments: Took medications this am   Nuclear Med Study  1 or 2 day study: 1 day  Stress Test Type: Treadmill/Lexiscan   Reading MD: Dola Argyle, MD  Order Authorizing Provider: Peter Martinique, MD, and Truitt Merle, NP   Resting Radionuclide: Technetium 56m Sestamibi  Resting Radionuclide Dose: 11.0 mCi   Stress Radionuclide: Technetium 11m Sestamibi  Stress Radionuclide Dose: 33.0 mCi    Stress Protocol  Rest HR: 54  Stress HR: 102   Rest BP: 141/78  Stress BP: 196/65   Exercise Time (min): n/a  METS:  n/a   Predicted Max HR: 153 bpm  % Max HR: 66.67 bpm  Rate Pressure Product: 19992  Dose of Adenosine (mg): n/a  Dose of Lexiscan: 0.4 mg   Dose of Atropine (mg): n/a  Dose of Dobutamine: n/a mcg/kg/min (at max HR)   Stress Test Technologist: Perrin Maltese, EMT-P  Nuclear Technologist: Charlton Amor, CNMT   Rest Procedure: Myocardial perfusion imaging was performed at rest 45 minutes following the intravenous administration of Technetium 82m Sestamibi.  Rest ECG: Sinus bradycardia with normal QRS  Stress Procedure: The patient received IV Lexiscan 0.4 mg over 15-seconds with concurrent low level exercise and then Technetium 86m Sestamibi was injected at 30-seconds while the patient continued walking one more minute. This patient had sob and knee pain with the Lexiscan injection. Quantitative spect images were obtained after a 45-minute delay.  Stress ECG: No significant change from baseline ECG  QPS  Raw Data Images: Normal; no motion artifact; normal heart/lung ratio.  Stress Images: Normal homogeneous uptake in all areas of the myocardium.  Rest Images: Normal homogeneous uptake in all areas of the myocardium.  Subtraction (SDS): No evidence of ischemia.  Transient Ischemic Dilatation (Normal <1.22): 1.04  Lung/Heart Ratio (Normal <0.45): 0.39  Quantitative Gated Spect Images  QGS EDV: 133 ml  QGS ESV: 54 ml  Impression  Exercise Capacity: Lexiscan with low level exercise.  BP Response: Normal blood pressure response.  Clinical Symptoms: shortness of breath  ECG Impression: No significant ST segment change suggestive of ischemia.  Comparison with Prior Nuclear Study: No significant change from previous study, May, 2012  Overall Impression: Normal stress nuclear study. There is no scar or ischemia. This is a low risk scan.  LV Ejection Fraction: 60%. LV Wall  Motion: Normal Wall Motion.  Dola Argyle, MD      Assessment / Plan: 1. CAD - s/p stent of the LAD in 2005. No active symptoms. Asymptomatic. Normal Myoview June 2014. Continue medical Rx.  2. HTN - blood pressure is fairly well controlled.  3. HLD on therapy. Concerned about interaction with simvastatin and amlodipine/fenofibrate. Will switch to atorvastatin 40 mg daily.  4. Obesity - needs to work on weight loss and increasing aerobic activities.   5. Recurrent PEs. On anticoagulation. Recommend he stop taking ASA and Mobic on Xarelto due to bleeding risk.    I will follow up in one year.

## 2014-11-27 NOTE — Patient Instructions (Signed)
Stop ASA, Mobic, and simvastatin  Start atorvastatin 40 mg daily  Continue your other meds.  I will see you in one year.

## 2014-12-10 DIAGNOSIS — M170 Bilateral primary osteoarthritis of knee: Secondary | ICD-10-CM | POA: Diagnosis not present

## 2014-12-19 DIAGNOSIS — M545 Low back pain: Secondary | ICD-10-CM | POA: Diagnosis not present

## 2014-12-31 ENCOUNTER — Other Ambulatory Visit: Payer: Self-pay | Admitting: Physical Medicine and Rehabilitation

## 2014-12-31 DIAGNOSIS — M545 Low back pain: Secondary | ICD-10-CM

## 2015-01-01 DIAGNOSIS — M1711 Unilateral primary osteoarthritis, right knee: Secondary | ICD-10-CM | POA: Diagnosis not present

## 2015-01-01 DIAGNOSIS — M172 Bilateral post-traumatic osteoarthritis of knee: Secondary | ICD-10-CM | POA: Diagnosis not present

## 2015-01-08 DIAGNOSIS — M170 Bilateral primary osteoarthritis of knee: Secondary | ICD-10-CM | POA: Diagnosis not present

## 2015-01-09 DIAGNOSIS — F419 Anxiety disorder, unspecified: Secondary | ICD-10-CM | POA: Diagnosis not present

## 2015-01-15 DIAGNOSIS — M170 Bilateral primary osteoarthritis of knee: Secondary | ICD-10-CM | POA: Diagnosis not present

## 2015-01-21 ENCOUNTER — Ambulatory Visit
Admission: RE | Admit: 2015-01-21 | Discharge: 2015-01-21 | Disposition: A | Discharge status: Home / Self Care | Payer: Medicare Other | Source: Ambulatory Visit | Attending: Physical Medicine and Rehabilitation | Admitting: Physical Medicine and Rehabilitation

## 2015-01-21 DIAGNOSIS — M5136 Other intervertebral disc degeneration, lumbar region: Secondary | ICD-10-CM | POA: Diagnosis not present

## 2015-01-21 DIAGNOSIS — M545 Low back pain: Secondary | ICD-10-CM

## 2015-01-21 DIAGNOSIS — M5126 Other intervertebral disc displacement, lumbar region: Secondary | ICD-10-CM | POA: Diagnosis not present

## 2015-01-21 DIAGNOSIS — M47817 Spondylosis without myelopathy or radiculopathy, lumbosacral region: Secondary | ICD-10-CM | POA: Diagnosis not present

## 2015-01-22 DIAGNOSIS — M170 Bilateral primary osteoarthritis of knee: Secondary | ICD-10-CM | POA: Diagnosis not present

## 2015-01-23 DIAGNOSIS — M47816 Spondylosis without myelopathy or radiculopathy, lumbar region: Secondary | ICD-10-CM | POA: Diagnosis not present

## 2015-01-27 DIAGNOSIS — D2261 Melanocytic nevi of right upper limb, including shoulder: Secondary | ICD-10-CM | POA: Diagnosis not present

## 2015-01-27 DIAGNOSIS — Z85828 Personal history of other malignant neoplasm of skin: Secondary | ICD-10-CM | POA: Diagnosis not present

## 2015-01-27 DIAGNOSIS — L821 Other seborrheic keratosis: Secondary | ICD-10-CM | POA: Diagnosis not present

## 2015-01-27 DIAGNOSIS — L718 Other rosacea: Secondary | ICD-10-CM | POA: Diagnosis not present

## 2015-01-28 ENCOUNTER — Encounter: Payer: Self-pay | Admitting: Family

## 2015-01-28 ENCOUNTER — Ambulatory Visit (INDEPENDENT_AMBULATORY_CARE_PROVIDER_SITE_OTHER): Payer: Medicare Other | Admitting: Family

## 2015-01-28 VITALS — BP 118/82 | HR 69 | Temp 98.2°F | Resp 18 | Ht 65.0 in | Wt 257.0 lb

## 2015-01-28 DIAGNOSIS — E785 Hyperlipidemia, unspecified: Secondary | ICD-10-CM

## 2015-01-28 DIAGNOSIS — F32A Depression, unspecified: Secondary | ICD-10-CM

## 2015-01-28 DIAGNOSIS — G4733 Obstructive sleep apnea (adult) (pediatric): Secondary | ICD-10-CM

## 2015-01-28 DIAGNOSIS — I2699 Other pulmonary embolism without acute cor pulmonale: Secondary | ICD-10-CM | POA: Diagnosis not present

## 2015-01-28 DIAGNOSIS — F329 Major depressive disorder, single episode, unspecified: Secondary | ICD-10-CM

## 2015-01-28 DIAGNOSIS — I10 Essential (primary) hypertension: Secondary | ICD-10-CM

## 2015-01-28 NOTE — Progress Notes (Signed)
Pre visit review using our clinic review tool, if applicable. No additional management support is needed unless otherwise documented below in the visit note. 

## 2015-01-28 NOTE — Assessment & Plan Note (Signed)
Sleep apnea currently stable with CPAP as prescribed by pulmonology. Reports quality sleep. Continue current CPAP and follow-up with changes per pulmonary as needed.

## 2015-01-28 NOTE — Assessment & Plan Note (Signed)
Stable and anticoagulated with Xarelto. Takes the medication as prescribed and denies any nuisance bleeding. Continue current dosage of Xarelto.

## 2015-01-28 NOTE — Assessment & Plan Note (Signed)
Previous blood work indicates cholesterol is stable with current regimen of fenofibrate and atorvastatin. Obtain lipid profile to obtain current status. Continue current dosage of atorvastatin and fenofibrate pending lab results.

## 2015-01-28 NOTE — Patient Instructions (Addendum)
Thank you for choosing [facility].  Summary/Instructions:  Please continue to take your medications as prescribed.   Please stop by the lab on the basement level of the building for your blood work. Your results will be released to [facility] (or called to you) after review, usually within 72 hours after test completion. If any changes need to be made, you will be notified at that same time.  If your symptoms worsen or fail to improve, please contact our office for further instruction, or in case of emergency go directly to the emergency room at the closest medical facility.    Exercise to Lose Weight Exercise and a healthy diet may help you lose weight. Your doctor may suggest specific exercises. EXERCISE IDEAS AND TIPS  Choose low-cost things you enjoy doing, such as walking, bicycling, or exercising to workout videos.  Take stairs instead of the elevator.  Walk during your lunch break.  Park your car further away from work or school.  Go to a gym or an exercise class.  Start with 5 to 10 minutes of exercise each day. Build up to 30 minutes of exercise 4 to 6 days a week.  Wear shoes with good support and comfortable clothes.  Stretch before and after working out.  Work out until you breathe harder and your heart beats faster.  Drink extra water when you exercise.  Do not do so much that you hurt yourself, feel dizzy, or get very short of breath. Exercises that burn about 150 calories:  Running 1  miles in 15 minutes.  Playing volleyball for 45 to 60 minutes.  Washing and waxing a car for 45 to 60 minutes.  Playing touch football for 45 minutes.  Walking 1  miles in 35 minutes.  Pushing a stroller 1  miles in 30 minutes.  Playing basketball for 30 minutes.  Raking leaves for 30 minutes.  Bicycling 5 miles in 30 minutes.  Walking 2 miles in 30 minutes.  Dancing for 30 minutes.  Shoveling snow for 15 minutes.  Swimming laps for 20  minutes.  Walking up stairs for 15 minutes.  Bicycling 4 miles in 15 minutes.  Gardening for 30 to 45 minutes.  Jumping rope for 15 minutes.  Washing windows or floors for 45 to 60 minutes. Document Released: 07/23/2010 Document Revised: 09/12/2011 Document Reviewed: 07/23/2010 [facility] Patient Information 2015 [place], [place]. This information is not intended to replace advice given to you by your health care provider. Make sure you discuss any questions you have with your health care provider.

## 2015-01-28 NOTE — Assessment & Plan Note (Signed)
Stable and currently below goal of 140/90 with current regimen. Continue current dosages of amlodipine and losartan-hydrochlorothiazide. Continue to monitor blood pressure at home.

## 2015-01-28 NOTE — Assessment & Plan Note (Signed)
Stable with current dosage of venlafaxine. Denies suicidal ideations. Takes medication as prescribed and denies adverse side effects. Continue current dosage of venlafaxine.

## 2015-01-28 NOTE — Progress Notes (Signed)
Subjective:    Patient ID: Aaron [name], male    DOB: May 15, 1946, 68 y.o.   MRN: [id number]  Chief Complaint  Patient presents with  . Establish Care    HPI:  Aaron [name] is a 69 y.o. male with a PMH of sleep apnea, pulmonary embolism, insomnia, hypertension, hyperlipidemia, GERD, depression, and coronary artery disease who presents today for an office visit to establish care.    1.) Multiple orthopedic concerns - Associated symptoms of pain located in his back and knees has been going on for several years. Modifying factors for the knee pain include viscosupplimentation through orthopedics. Modifying factors for his back pain include injections into his lower back. Previously on pain medication, however pain has been reduced with treatments. Aggrevating factors include his obesity. He does attempt to walk periodically. The knee pain is described as sharp on occasion.   2.) Sleep apnea - currently stable with CPAP and managed by Dr. Halford Chessman of pulmonology. Indicates that he sleeps well and the the CPAP is helping.   3.) Blood clots - currently maintained on Xarelto. Takes the medication as prescribed and denies adverse side effects. Denies nuisance bleeding.  4.) Hyperlipidemia - currently maintained on fenofibrate and recently changed to atorvastatin. Takes medication as prescribed and denies of her side effects. Denies myalgias.  5.) Depression - stable with current dosage of venlafaxine. Takes the medication as prescribed and denies adverse side effects. Depression related to previous life events of his wife passing and other family related issues.   Lab Results  Component Value Date   CHOL 177 06/09/2011   HDL 42 06/09/2011   LDLCALC 106* 06/09/2011   TRIG 147 06/09/2011   CHOLHDL 4.2 06/09/2011   Allergies  Allergen Reactions  . Amoxicillin     Rapid heart rate.      Outpatient Prescriptions Prior to Visit  Medication Sig Dispense Refill  . ALPRAZolam (XANAX) 0.5 MG  tablet Take 0.5 mg by mouth 3 (three) times daily as needed. As needed for sleep.    Marland Kitchen amLODipine (NORVASC) 10 MG tablet Take 1 tablet by mouth daily.    Marland Kitchen atorvastatin (LIPITOR) 40 MG tablet Take 1 tablet (40 mg total) by mouth daily. 90 tablet 3  . fenofibrate micronized (LOFIBRA) 200 MG capsule Take 200 mg by mouth daily before breakfast.      . GENTAK 0.3 % ophthalmic ointment Place 1 mL into both eyes daily.  1  . losartan-hydrochlorothiazide (HYZAAR) 100-25 MG per tablet Take 1 tablet by mouth daily.    Marland Kitchen omeprazole (PRILOSEC) 20 MG capsule Take 20 mg by mouth daily.      . polyethylene glycol powder (MIRALAX) powder Take 17 g by mouth daily.     . Rivaroxaban (XARELTO) 20 MG TABS tablet Take 20 mg by mouth daily with supper.    . traMADol (ULTRAM) 50 MG tablet Take 50 mg by mouth every 6 (six) hours as needed.      . trimethoprim-polymyxin b (POLYTRIM) ophthalmic solution Place 1 mL into both eyes daily.  1  . venlafaxine (EFFEXOR-XR) 37.5 MG 24 hr capsule Take 37.5 mg by mouth daily.     . bisacodyl (BISACODYL) 5 MG EC tablet Take 5 mg by mouth daily as needed for constipation.     No facility-administered medications prior to visit.     Past Medical History  Diagnosis Date  . Coronary artery disease     DES LAD 2005  . Hypertension   .  Hyperlipidemia   . Depression   . GERD (gastroesophageal reflux disease)   . Insomnia   . Sleep apnea, obstructive   . Pulmonary embolism ~ 03/2006    bilaterally  . Osteoarthritis   . PONV (postoperative nausea and vomiting)   . Shortness of breath 06/08/11    "w/exertion; that's why I'm here"  . Heart murmur   . Blood transfusion     "@ birth"  . Anemia     "@ birth"  . 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 PTSD (post-traumatic stress disorder)      Past Surgical History  Procedure Laterality Date  . Spinal injections  2006-2007    "maybe 10 injections for herniated discs"  . Carpel tunnel release   ~ 06/2005/~ 08/2005    right/left  . Coronary stent placement  02/13/2004    1  . Inguinal hernia repair  1983  . Inguinal hernia repair  1985  . Tonsillectomy      "when I was real young"  . Eye surgery      age 66; "ruptured  right pupil"  . Eye surgery      age 60  . Enucleation      age 22  . Eye surgery  1957    placed artificial right eye  . Cardiac catheterization    . Knee arthroscopy  ~ 06/2005    right  . Coronary angioplasty       Family History  Problem Relation Age of Onset  . Breast cancer Sister   . Liver cancer Sister   . Asthma Father   . Asthma Sister   . Asthma Brother   . Emphysema Father      History   Social History  . Marital Status: Married    Spouse Name: N/A  . Number of Children: 3  . Years of Education: N/A   Occupational History  . building maintenance     disabled   Social History Main Topics  . Smoking status: Former Smoker -- 1.50 packs/day for 25 years    Types: Cigarettes    Quit date: 07/10/1995  . Smokeless tobacco: Never Used  . Alcohol Use: Yes     Comment: "maybe a beer once a year"  . Drug Use: No  . Sexual Activity: Not on file   Other Topics Concern  . 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       Review of Systems  Constitutional: Negative for fever and chills.  Eyes:       Negative for changes in vision.   Respiratory: Negative for chest tightness and shortness of breath.   Cardiovascular: Negative for chest pain, palpitations and leg swelling.  Musculoskeletal: Positive for arthralgias.  Neurological: Negative for headaches.      Objective:    BP 118/82 mmHg  Pulse 69  Temp(Src) 98.2 F (36.8 C) (Oral)  Resp 18  Ht _0  (1.651 m)  Wt 257 lb (116.574 kg)  BMI 42.77 kg/m2  SpO2 93% Nursing note and vital signs reviewed.  Physical Exam  Constitutional: He is oriented to person, place, and time. He appears well-developed and  well-nourished. No distress.  Obese gentleman seated in the chair, appears his stated age and is dressed appropriately for the situation.  Cardiovascular: Normal rate, regular rhythm, normal heart sounds and intact distal pulses.  Pulmonary/Chest: Effort normal and breath sounds normal.  Neurological: He is alert and oriented to person, place, and time.  Skin: Skin is warm and dry.  Psychiatric: He has a normal mood and affect. His behavior is normal. Judgment and thought content normal.       Assessment & Plan:   Problem List Items Addressed This Visit      Cardiovascular and Mediastinum   Hypertension - Primary    Stable and currently below goal of 140/90 with current regimen. Continue current dosages of amlodipine and losartan-hydrochlorothiazide. Continue to monitor blood pressure at home.      Relevant Orders   Comp Met (CMET)   Pulmonary embolism    Stable and anticoagulated with Xarelto. Takes the medication as prescribed and denies any nuisance bleeding. Continue current dosage of Xarelto.        Respiratory   Sleep apnea, obstructive    Sleep apnea currently stable with CPAP as prescribed by pulmonology. Reports quality sleep. Continue current CPAP and follow-up with changes per pulmonary as needed.        Other   Hyperlipidemia    Previous blood work indicates cholesterol is stable with current regimen of fenofibrate and atorvastatin. Obtain lipid profile to obtain current status. Continue current dosage of atorvastatin and fenofibrate pending lab results.      Relevant Orders   Comp Met (CMET)   Lipid Profile   Depression    Stable with current dosage of venlafaxine. Denies suicidal ideations. Takes medication as prescribed and denies adverse side effects. Continue current dosage of venlafaxine.

## 2015-01-29 DIAGNOSIS — M170 Bilateral primary osteoarthritis of knee: Secondary | ICD-10-CM | POA: Diagnosis not present

## 2015-02-05 ENCOUNTER — Telehealth: Payer: Self-pay | Admitting: Family

## 2015-02-05 NOTE — Telephone Encounter (Signed)
Received records from [place] forwarded 89 pages to Dr. Terri Piedra 02/05/15 fbg.

## 2015-02-11 ENCOUNTER — Other Ambulatory Visit (INDEPENDENT_AMBULATORY_CARE_PROVIDER_SITE_OTHER): Payer: Medicare Other

## 2015-02-11 ENCOUNTER — Encounter: Payer: Self-pay | Admitting: Family

## 2015-02-11 ENCOUNTER — Ambulatory Visit (INDEPENDENT_AMBULATORY_CARE_PROVIDER_SITE_OTHER): Payer: Medicare Other | Admitting: Family

## 2015-02-11 VITALS — BP 122/68 | HR 67 | Temp 98.1°F | Resp 18 | Wt 255.0 lb

## 2015-02-11 DIAGNOSIS — Z23 Encounter for immunization: Secondary | ICD-10-CM

## 2015-02-11 DIAGNOSIS — Z0000 Encounter for general adult medical examination without abnormal findings: Secondary | ICD-10-CM | POA: Insufficient documentation

## 2015-02-11 DIAGNOSIS — D649 Anemia, unspecified: Secondary | ICD-10-CM | POA: Diagnosis not present

## 2015-02-11 DIAGNOSIS — E785 Hyperlipidemia, unspecified: Secondary | ICD-10-CM

## 2015-02-11 LAB — COMPREHENSIVE METABOLIC PANEL
ALT: 18 U/L (ref 0–53)
AST: 15 U/L (ref 0–37)
Albumin: 4.3 g/dL (ref 3.5–5.2)
Alkaline Phosphatase: 59 U/L (ref 39–117)
BUN: 18 mg/dL (ref 6–23)
CHLORIDE: 103 meq/L (ref 96–112)
CO2: 28 mEq/L (ref 19–32)
Calcium: 9.4 mg/dL (ref 8.4–10.5)
Creatinine, Ser: 1.12 mg/dL (ref 0.40–1.50)
GFR: 69.05 mL/min (ref >60.00)
Glucose, Bld: 101 mg/dL — ABNORMAL HIGH (ref 70–99)
Potassium: 3.9 mEq/L (ref 3.5–5.1)
Sodium: 139 mEq/L (ref 135–145)
TOTAL PROTEIN: 7.6 g/dL (ref 6.0–8.3)
Total Bilirubin: 0.4 mg/dL (ref 0.2–1.2)

## 2015-02-11 LAB — TSH: TSH: 4.58 u[IU]/mL — AB (ref 0.35–4.50)

## 2015-02-11 LAB — PSA: PSA: 0.58 ng/mL (ref 0.10–4.00)

## 2015-02-11 LAB — LIPID PANEL
CHOL/HDL RATIO: 4
Cholesterol: 151 mg/dL (ref 0–200)
HDL: 41.1 mg/dL (ref >39.00)
LDL Cholesterol: 87 mg/dL (ref 0–99)
NONHDL: 110.2
Triglycerides: 114 mg/dL (ref 0.0–149.0)
VLDL: 22.8 mg/dL (ref 0.0–40.0)

## 2015-02-11 LAB — IBC PANEL
Iron: 90 ug/dL (ref 42–165)
Saturation Ratios: 21.7 % (ref 20.0–50.0)
Transferrin: 296 mg/dL (ref 212.0–360.0)

## 2015-02-11 LAB — HEMOGLOBIN A1C: HEMOGLOBIN A1C: 6.2 % (ref 4.6–6.5)

## 2015-02-11 MED ORDER — ALPRAZOLAM 0.5 MG PO TABS: 0.5000 mg | ORAL_TABLET | Freq: Three times a day (TID) | ORAL | Status: DC | PRN

## 2015-02-11 MED ORDER — TRAMADOL HCL 50 MG PO TABS: 50.0000 mg | ORAL_TABLET | Freq: Four times a day (QID) | ORAL | Status: DC | PRN

## 2015-02-11 NOTE — Assessment & Plan Note (Signed)
1) Anticipatory Guidance: Discussed importance of wearing a seatbelt while driving and not texting while driving; changing batteries in smoke detector at least once annually; wearing suntan lotion when outside; eating a balanced and moderate diet; getting physical activity at least 30 minutes per day.  2) Immunizations / Screenings / Labs:  Tetanus and Prevnar updated today. All immunizations have been completed and are up-to-date per recommendations. Colonoscopy due in 2017. Obtain PSA to check prostate. All other screenings are up-to-date per recommendations. Obtain CMET, Lipid profile, iron panel, A1c, PSA and TSH.   Overall adequate exam. Several risk factors for cardiovascular disease include obesity, hypertension, and hyperlipidemia. Hypertension and hyperlipidemia are currently well controlled with medications. Obesity interventions discussed include increasing nutrient dense foods and decreasing saturated fats well reducing overall caloric intake. Recommend increasing physical activity to 30 minutes most days of the week. Currently experiencing difficulty with his knees and may require bilateral knee replacements. He is currently working with orthopedics through this. Aquatic therapy would be indicated for exercise and to make him physically active. Goal will be to lose 5-10% of current body weight. Follow-up prevention exam in 1 year. Follow-up office visit pending lab work.

## 2015-02-11 NOTE — Progress Notes (Signed)
Pre visit review using our clinic review tool, if applicable. No additional management support is needed unless otherwise documented below in the visit note. 

## 2015-02-11 NOTE — Assessment & Plan Note (Signed)
Reviewed and updated patient's medical, surgical, family and social history. Medications and allergies were also reviewed. Basic screenings for depression, activities of daily living, hearing, cognition and safety were performed. Provider list was updated and health plan was provided to the patient.  

## 2015-02-11 NOTE — Progress Notes (Signed)
Subjective:    Patient ID: Aaron [name], male    DOB: 06-05-1946, 69 y.o.   MRN: [id number]  Chief Complaint  Patient presents with  . CPE    fasting    HPI:  Aaron [name] is a 69 y.o. male who presents today for an annual wellness visit.   1) Health Maintenance -   Diet - Averages about 3 meals per day consisting of chicken, pork, cube steak, occasional fruits and vegetables; Caffeine intake of 3-4 cups per day  Exercise - Walking occasionally and yard work  2) Publishing rights manager / Immunizations:  Dental -- Dentures   Vision -- Up to date  Health Maintenance  Topic Date Due  . Hepatitis C Screening  December 18, 1945  . INFLUENZA VACCINE  02/02/2015  . COLONOSCOPY  02/02/2016  . TETANUS/TDAP  02/10/2025  . ZOSTAVAX  Addressed  . PNA vac Low Risk Adult  Completed     Immunization History  Administered Date(s) Administered  . Influenza Split 05/05/2011, 04/03/2013  . Influenza Whole 04/30/2012  . Pneumococcal Conjugate-13 02/11/2015  . Pneumococcal Polysaccharide-23 06/09/2011  . Td 02/11/2015     RISK FACTORS  Tobacco History  Smoking status  . Former Smoker -- 1.50 packs/day for 25 years  . Types: Cigarettes  . Quit date: 07/10/1995  Smokeless tobacco  . Never Used     Cardiac risk factors: advanced age (older than 66 for men, 30 for women), dyslipidemia, hypertension, male gender, obesity (BMI >= 30 kg/m2) and sedentary lifestyle.  Depression Screen  Q1: Over the past two weeks, have you felt down, depressed or hopeless? Yes - depressed about knees  Q2: Over the past two weeks, have you felt little interest or pleasure in doing things? No  Have you lost interest or pleasure in daily life? No  Do you often feel hopeless? No  Do you cry easily over simple problems? No  Activities of Daily Living In your present state of health, do you have any difficulty performing the following activities?:  Driving? No Managing money?  No Feeding yourself?  No Getting from bed to chair? No Climbing a flight of stairs? Yes - secondary to knee pain Preparing food and eating?: No Bathing or showering? No Getting dressed: No Getting to the toilet? No Using the toilet: No Moving around from place to place: No In the past year have you fallen or had a near fall?:No   Home Safety Has smoke detector and wears seat belts. No firearms. No excess sun exposure. Are there smokers in your home (other than you)?  No Do you feel safe at home?  Yes  Hearing Difficulties: No Do you often ask people to speak up or repeat themselves? No Do you experience ringing or noises in your ears? No  Do you have difficulty understanding soft or whispered voices? No    Cognitive Testing  Alert? Yes   Normal Appearance? Yes  Oriented to person? Yes  Place? Yes   Time? Yes  Recall of three objects?  Yes  Can perform simple calculations? Yes  Displays appropriate judgment? Yes  Can read the correct time from a watch face? Yes  Do you feel that you have a problem with memory? No  Do you often misplace items? No   Advanced Directives have been discussed with the patient? Working on completing  Current Physicians/Providers and Suppliers  1. Terri Piedra, FNP - Primary Care 2. Peter Martinique, MD - Cardiology 3. Dr. Mina Marble - Back specialist  4.  Dr. Mayer Camel - Orthopedics 5.  Danella Sensing, MD - Dermatology 6.  Nat Man, MD - GI 7. Warden Fillers, MD - Opthalmologist 8. [name], MD - Pulmonology  Indicate any recent Medical Services you may have received from other than [facility] providers in the past year (date may be approximate).  All answers were reviewed with the patient and necessary referrals were made:  Aaron [name], [place]   02/11/2015    Allergies  Allergen Reactions  . Amoxicillin     Rapid heart rate.      Outpatient Prescriptions Prior to Visit  Medication Sig Dispense Refill  . ALPRAZolam (XANAX) 0.5 MG tablet Take 0.5 mg by mouth 3 (three)  times daily as needed. As needed for sleep.    Marland Kitchen amLODipine (NORVASC) 10 MG tablet Take 1 tablet by mouth daily.    Marland Kitchen atorvastatin (LIPITOR) 40 MG tablet Take 1 tablet (40 mg total) by mouth daily. 90 tablet 3  . fenofibrate micronized (LOFIBRA) 200 MG capsule Take 200 mg by mouth daily before breakfast.      . GENTAK 0.3 % ophthalmic ointment Place 1 mL into both eyes daily.  1  . losartan-hydrochlorothiazide (HYZAAR) 100-25 MG per tablet Take 1 tablet by mouth daily.    Marland Kitchen omeprazole (PRILOSEC) 20 MG capsule Take 20 mg by mouth daily.      . polyethylene glycol powder (MIRALAX) powder Take 17 g by mouth daily.     . Rivaroxaban (XARELTO) 20 MG TABS tablet Take 20 mg by mouth daily with supper.    . traMADol (ULTRAM) 50 MG tablet Take 50 mg by mouth every 6 (six) hours as needed.      . trimethoprim-polymyxin b (POLYTRIM) ophthalmic solution Place 1 mL into both eyes daily.  1  . venlafaxine (EFFEXOR-XR) 37.5 MG 24 hr capsule Take 37.5 mg by mouth daily.      No facility-administered medications prior to visit.     Past Medical History  Diagnosis Date  . Coronary artery disease     DES LAD 2005  . Hypertension   . Hyperlipidemia   . Depression   . GERD (gastroesophageal reflux disease)   . Insomnia   . Sleep apnea, obstructive   . Pulmonary embolism ~ 03/2006    bilaterally  . Osteoarthritis   . PONV (postoperative nausea and vomiting)   . Shortness of breath 06/08/11    "w/exertion; that's why I'm here"  . Heart murmur   . Blood transfusion     "@ birth"  . Anemia     "@ birth"  . 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 PTSD (post-traumatic stress disorder)      Past Surgical History  Procedure Laterality Date  . Spinal injections  2006-2007    "maybe 10 injections for herniated discs"  . Carpel tunnel release  ~ 06/2005/~ 08/2005    right/left  . Coronary stent placement  02/13/2004    1  . Inguinal hernia repair  1983  . Inguinal hernia  repair  1985  . Tonsillectomy      "when I was real young"  . Eye surgery      age 69; "ruptured  right pupil"  . Eye surgery      age 41  . Enucleation      age 21  . Eye surgery  1957    placed artificial right eye  . Cardiac catheterization    .  Knee arthroscopy  ~ 06/2005    right  . Coronary angioplasty       Family History  Problem Relation Age of Onset  . Breast cancer Sister   . Liver cancer Sister   . Asthma Father   . Asthma Sister   . Asthma Brother   . Emphysema Father      Social History   Social History  . Marital Status: Married    Spouse Name: N/A  . Number of Children: 3  . Years of Education: N/A   Occupational History  . building maintenance     disabled   Social History Main Topics  . Smoking status: Former Smoker -- 1.50 packs/day for 25 years    Types: Cigarettes    Quit date: 07/10/1995  . Smokeless tobacco: Never Used  . Alcohol Use: Yes     Comment: "maybe a beer once a year"  . Drug Use: No  . Sexual Activity: Not on file   Other Topics Concern  . 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       Review of Systems  Constitutional: Denies fever, chills, fatigue, or significant weight gain/loss. HENT: Head: Denies headache or neck pain Ears: Denies changes in hearing, ringing in ears, earache, drainage Nose: Denies discharge, stuffiness, itching, nosebleed, sinus pain Throat: Denies sore throat, hoarseness, dry mouth, sores, thrush Eyes: Denies loss/changes in vision, pain, redness, blurry/double vision, flashing lights Cardiovascular: Denies chest pain/discomfort, tightness, palpitations, shortness of breath with activity, difficulty lying down, swelling, sudden awakening with shortness of breath Respiratory: Denies shortness of breath, cough, sputum production, wheezing Gastrointestinal: Denies dysphasia, heartburn, change in appetite, nausea, change in bowel habits,  rectal bleeding, constipation, diarrhea, yellow skin or eyes Genitourinary: Denies frequency, urgency, burning/pain, blood in urine, incontinence, change in urinary strength. Musculoskeletal: Denies muscle/joint pain, stiffness, back pain, redness or swelling of joints, trauma Positive for knee pain - recent injections Skin: Denies rashes, lumps, itching, dryness, color changes, or hair/nail changes Neurological: Denies dizziness, fainting, seizures, weakness, numbness, tingling, tremor Psychiatric - Denies nervousness, stress, depression or memory loss Endocrine: Denies heat or cold intolerance, sweating, frequent urination, excessive thirst, changes in appetite Hematologic: Denies ease of bruising or bleeding    Objective:    BP 122/68 mmHg  Pulse 67  Temp(Src) 98.1 F (36.7 C) (Oral)  Resp 18  Wt 255 lb (115.667 kg)  SpO2 95% Nursing note and vital signs reviewed.  Physical Exam  Constitutional: He is oriented to person, place, and time. He appears well-developed and well-nourished.  HENT:  Head: Normocephalic.  Right Ear: Hearing, tympanic membrane, external ear and ear canal normal.  Left Ear: Hearing, tympanic membrane, external ear and ear canal normal.  Nose: Nose normal.  Mouth/Throat: Uvula is midline, oropharynx is clear and moist and mucous membranes are normal.  Eyes: Conjunctivae and EOM are normal. Pupils are equal, round, and reactive to light.  Neck: Neck supple. No JVD present. No tracheal deviation present. No thyromegaly present.  Cardiovascular: Normal rate, regular rhythm, normal heart sounds and intact distal pulses.   Pulmonary/Chest: Effort normal and breath sounds normal.  Abdominal: Soft. Bowel sounds are normal. He exhibits no distension and no mass. There is no tenderness. There is no rebound and no guarding.  Musculoskeletal: Normal range of motion. He exhibits no edema or tenderness.  Lymphadenopathy:    He has no cervical adenopathy.  Neurological:  He is  alert and oriented to person, place, and time. He has normal reflexes. No cranial nerve deficit. He exhibits normal muscle tone. Coordination normal.  Skin: Skin is warm and dry.  Psychiatric: He has a normal mood and affect. His behavior is normal. Judgment and thought content normal.       Assessment & Plan:   During the course of the visit the patient was educated and counseled about appropriate screening and preventive services including:    Pneumococcal vaccine   Influenza vaccine  Td vaccine  Prostate cancer screening  Colorectal cancer screening  Diabetes screening  Nutrition counseling   Diet review for nutrition referral? Yes ____  Not Indicated _X___   Patient Instructions (the written plan) was given to the patient.  Medicare Attestation I have personally reviewed: The patient's medical and social history Their use of alcohol, tobacco or illicit drugs Their current medications and supplements The patient's functional ability including ADLs,fall risks, home safety risks, cognitive, and hearing and visual impairment Diet and physical activities Evidence for depression or mood disorders  The patient's weight, height, BMI,  have been recorded in the chart.  I have made referrals, counseling, and provided education to the patient based on review of the above and I have provided the patient with a written personalized care plan for preventive services.     Aaron [name], [place]   02/11/2015    Problem List Items Addressed This Visit      Other   Routine general medical examination at a health care facility    1) Anticipatory Guidance: Discussed importance of wearing a seatbelt while driving and not texting while driving; changing batteries in smoke detector at least once annually; wearing suntan lotion when outside; eating a balanced and moderate diet; getting physical activity at least 30 minutes per day.  2) Immunizations / Screenings / Labs:  Tetanus  and Prevnar updated today. All immunizations have been completed and are up-to-date per recommendations. Colonoscopy due in 2017. Obtain PSA to check prostate. All other screenings are up-to-date per recommendations. Obtain CMET, Lipid profile, iron panel, A1c, PSA and TSH.   Overall adequate exam. Several risk factors for cardiovascular disease include obesity, hypertension, and hyperlipidemia. Hypertension and hyperlipidemia are currently well controlled with medications. Obesity interventions discussed include increasing nutrient dense foods and decreasing saturated fats well reducing overall caloric intake. Recommend increasing physical activity to 30 minutes most days of the week. Currently experiencing difficulty with his knees and may require bilateral knee replacements. He is currently working with orthopedics through this. Aquatic therapy would be indicated for exercise and to make him physically active. Goal will be to lose 5-10% of current body weight. Follow-up prevention exam in 1 year. Follow-up office visit pending lab work.       Relevant Orders   Comprehensive metabolic panel   TSH   PSA   Lipid panel   Hemoglobin A1c   IBC panel   Medicare annual wellness visit, subsequent - Primary    Reviewed and updated patient's medical, surgical, family and social history. Medications and allergies were also reviewed. Basic screenings for depression, activities of daily living, hearing, cognition and safety were performed. Provider list was updated and health plan was provided to the patient.         Other Visit Diagnoses    Need for TD vaccine        Relevant Orders    Td vaccine greater than or equal to 7yo preservative free IM (Completed)  Need for vaccination with 13-polyvalent pneumococcal conjugate vaccine        Relevant Orders    Pneumococcal conjugate vaccine 13-valent IM (Completed)

## 2015-02-11 NOTE — Patient Instructions (Addendum)
Thank you for choosing [facility].  Summary/Instructions:  Please stop taking the amlodipine.   Your prescription(s) have been submitted to your pharmacy or been printed and provided for you. Please take as directed and contact our office if you believe you are having problem(s) with the medication(s) or have any questions.  Please stop by the lab on the basement level of the building for your blood work. Your results will be released to [place] (or called to you) after review, usually within 72 hours after test completion. If any changes need to be made, you will be notified at that same time.  If your symptoms worsen or fail to improve, please contact our office for further instruction, or in case of emergency go directly to the emergency room at the closest medical facility.   Health Maintenance  Topic Date Due  . Hepatitis C Screening  1945/07/26  . INFLUENZA VACCINE  02/02/2015  . COLONOSCOPY  02/02/2016  . TETANUS/TDAP  02/10/2025  . ZOSTAVAX  Addressed  . PNA vac Low Risk Adult  Completed   Health Maintenance A healthy lifestyle and preventative care can promote health and wellness.  Maintain regular health, dental, and eye exams.  Eat a healthy diet. Foods like vegetables, fruits, whole grains, low-fat dairy products, and lean protein foods contain the nutrients you need and are low in calories. Decrease your intake of foods high in solid fats, added sugars, and salt. Get information about a proper diet from your health care provider, if necessary.  Regular physical exercise is one of the most important things you can do for your health. Most adults should get at least 150 minutes of moderate-intensity exercise (any activity that increases your heart rate and causes you to sweat) each week. In addition, most adults need muscle-strengthening exercises on 2 or more days a week.   Maintain a healthy weight. The body mass index (BMI) is a screening tool to identify possible  weight problems. It provides an estimate of body fat based on height and weight. Your health care provider can find your BMI and can help you achieve or maintain a healthy weight. For males 20 years and older:  A BMI below 18.5 is considered underweight.  A BMI of 18.5 to 24.9 is normal.  A BMI of 25 to 29.9 is considered overweight.  A BMI of 30 and above is considered obese.  Maintain normal blood lipids and cholesterol by exercising and minimizing your intake of saturated fat. Eat a balanced diet with plenty of fruits and vegetables. Blood tests for lipids and cholesterol should begin at age 29 and be repeated every 5 years. If your lipid or cholesterol levels are high, you are over age 60, or you are at high risk for heart disease, you may need your cholesterol levels checked more frequently.Ongoing high lipid and cholesterol levels should be treated with medicines if diet and exercise are not working.  If you smoke, find out from your health care provider how to quit. If you do not use tobacco, do not start.  Lung cancer screening is recommended for adults aged 65-80 years who are at high risk for developing lung cancer because of a history of smoking. A yearly low-dose CT scan of the lungs is recommended for people who have at least a 30-pack-year history of smoking and are current smokers or have quit within the past 15 years. A pack year of smoking is smoking an average of 1 pack of cigarettes a day for 1  year (for example, a 30-pack-year history of smoking could mean smoking 1 pack a day for 30 years or 2 packs a day for 15 years). Yearly screening should continue until the smoker has stopped smoking for at least 15 years. Yearly screening should be stopped for people who develop a health problem that would prevent them from having lung cancer treatment.  If you choose to drink alcohol, do not have more than 2 drinks per day. One drink is considered to be 12 oz (360 mL) of beer, 5 oz (150  mL) of wine, or 1.5 oz (45 mL) of liquor.  Avoid the use of street drugs. Do not share needles with anyone. Ask for help if you need support or instructions about stopping the use of drugs.  High blood pressure causes heart disease and increases the risk of stroke. Blood pressure should be checked at least every 1-2 years. Ongoing high blood pressure should be treated with medicines if weight loss and exercise are not effective.  If you are 9-31 years old, ask your health care provider if you should take aspirin to prevent heart disease.  Diabetes screening involves taking a blood sample to check your fasting blood sugar level. This should be done once every 3 years after age 4 if you are at a normal weight and without risk factors for diabetes. Testing should be considered at a younger age or be carried out more frequently if you are overweight and have at least 1 risk factor for diabetes.  Colorectal cancer can be detected and often prevented. Most routine colorectal cancer screening begins at the age of 16 and continues through age 28. However, your health care provider may recommend screening at an earlier age if you have risk factors for colon cancer. On a yearly basis, your health care provider may provide home test kits to check for hidden blood in the stool. A small camera at the end of a tube may be used to directly examine the colon (sigmoidoscopy or colonoscopy) to detect the earliest forms of colorectal cancer. Talk to your health care provider about this at age 38 when routine screening begins. A direct exam of the colon should be repeated every 5-10 years through age 48, unless early forms of precancerous polyps or small growths are found.  People who are at an increased risk for hepatitis B should be screened for this virus. You are considered at high risk for hepatitis B if:  You were born in a country where hepatitis B occurs often. Talk with your health care provider about which  countries are considered high risk.  Your parents were born in a high-risk country and you have not received a shot to protect against hepatitis B (hepatitis B vaccine).  You have HIV or AIDS.  You use needles to inject street drugs.  You live with, or have sex with, someone who has hepatitis B.  You are a man who has sex with other men (MSM).  You get hemodialysis treatment.  You take certain medicines for conditions like cancer, organ transplantation, and autoimmune conditions.  Hepatitis C blood testing is recommended for all people born from [birth year] through 1965 and any individual with known risk factors for hepatitis C.  Healthy men should no longer receive prostate-specific antigen (PSA) blood tests as part of routine cancer screening. Talk to your health care provider about prostate cancer screening.  Testicular cancer screening is not recommended for adolescents or adult males who have no symptoms. Screening includes  self-exam, a health care provider exam, and other screening tests. Consult with your health care provider about any symptoms you have or any concerns you have about testicular cancer.  Practice safe sex. Use condoms and avoid high-risk sexual practices to reduce the spread of sexually transmitted infections (STIs).  You should be screened for STIs, including gonorrhea and chlamydia if:  You are sexually active and are younger than 24 years.  You are older than 24 years, and your health care provider tells you that you are at risk for this type of infection.  Your sexual activity has changed since you were last screened, and you are at an increased risk for chlamydia or gonorrhea. Ask your health care provider if you are at risk.  If you are at risk of being infected with HIV, it is recommended that you take a prescription medicine daily to prevent HIV infection. This is called pre-exposure prophylaxis (PrEP). You are considered at risk if:  You are a man who has  sex with other men (MSM).  You are a heterosexual man who is sexually active with multiple partners.  You take drugs by injection.  You are sexually active with a partner who has HIV.  Talk with your health care provider about whether you are at high risk of being infected with HIV. If you choose to begin PrEP, you should first be tested for HIV. You should then be tested every 3 months for as long as you are taking PrEP.  Use sunscreen. Apply sunscreen liberally and repeatedly throughout the day. You should seek shade when your shadow is shorter than you. Protect yourself by wearing long sleeves, pants, a wide-brimmed hat, and sunglasses year round whenever you are outdoors.  Tell your health care provider of new moles or changes in moles, especially if there is a change in shape or color. Also, tell your health care provider if a mole is larger than the size of a pencil eraser.  A one-time screening for abdominal aortic aneurysm (AAA) and surgical repair of large AAAs by ultrasound is recommended for men aged 52-75 years who are current or former smokers.  Stay current with your vaccines (immunizations). Document Released: 12/17/2007 Document Revised: 06/25/2013 Document Reviewed: 11/15/2010 [facility] Patient Information 2015 [place], [place]. This information is not intended to replace advice given to you by your health care provider. Make sure you discuss any questions you have with your health care provider.   Advance Directive Advance directives are the legal documents that allow you to make choices about your health care and medical treatment if you cannot speak for yourself. Advance directives are a way for you to communicate your wishes to family, friends, and health care providers. The specified people can then convey your decisions about end-of-life care to avoid confusion if you should become unable to communicate. Ideally, the process of discussing and writing advance directives  should happen over time rather than making decisions all at once. Advance directives can be modified as your situation changes, and you can change your mind at any time, even after you have signed the advance directives. Each state has its own laws regarding advance directives. You may want to check with your health care provider, attorney, or state representative about the law in your state. Below are some examples of advance directives. LIVING WILL A living will is a set of instructions documenting your wishes about medical care when you cannot care for yourself. It is used if you become:  Terminally ill.  Incapacitated.  Unable to communicate.  Unable to make decisions. Items to consider in your living will include:  The use or non-use of life-sustaining equipment, such as dialysis machines and breathing machines (ventilators).  A do not resuscitate (DNR) order, which is the instruction not to use cardiopulmonary resuscitation (CPR) if breathing or heartbeat stops.  Tube feeding.  Withholding of food and fluids.  Comfort (palliative) care when the goal becomes comfort rather than a cure.  Organ and tissue donation. A living will does not give instructions about distribution of your money and property if you should pass away. It is advisable to seek the expert advice of a lawyer in drawing up a will regarding your possessions. Decisions about taxes, beneficiaries, and asset distribution will be legally binding. This process can relieve your family and friends of any burdens surrounding disputes or questions that may come up about the allocation of your assets. DO NOT RESUSCITATE (DNR) A do not resuscitate (DNR) order is a request to not have CPR in the event that your heart stops beating or you stop breathing. Unless given other instructions, a health care provider will try to help any patient whose heart has stopped or who has stopped breathing.  HEALTH CARE PROXY AND DURABLE POWER OF  ATTORNEY FOR HEALTH CARE A health care proxy is a person (agent) appointed to make medical decisions for you if you cannot. Generally, people choose someone they know well and trust to represent their preferences when they can no longer do so. You should be sure to ask this person for agreement to act as your agent. An agent may have to exercise judgment in the event of a medical decision for which your wishes are not known. The durable power of attorney for health care is the legal document that names your health care proxy. Once written, it should be:  Signed.  Notarized.  Dated.  Copied.  Witnessed.  Incorporated into your medical record. You may also want to appoint someone to manage your financial affairs if you cannot. This is called a durable power of attorney for finances. It is a separate legal document from the durable power of attorney for health care. You may choose the same person or someone different from your health care proxy to act as your agent in financial matters. Document Released: 09/27/2007 Document Revised: 06/25/2013 Document Reviewed: 11/07/2012 [facility] Patient Information 2015 [place], [place]. This information is not intended to replace advice given to you by your health care provider. Make sure you discuss any questions you have with your health care provider.

## 2015-02-12 ENCOUNTER — Other Ambulatory Visit: Payer: Self-pay | Admitting: Family

## 2015-02-12 DIAGNOSIS — M47816 Spondylosis without myelopathy or radiculopathy, lumbar region: Secondary | ICD-10-CM | POA: Diagnosis not present

## 2015-02-12 MED ORDER — ALPRAZOLAM 0.5 MG PO TABS: 0.5000 mg | ORAL_TABLET | Freq: Four times a day (QID) | ORAL | Status: DC | PRN

## 2015-02-19 DIAGNOSIS — M170 Bilateral primary osteoarthritis of knee: Secondary | ICD-10-CM | POA: Diagnosis not present

## 2015-03-06 DIAGNOSIS — G4733 Obstructive sleep apnea (adult) (pediatric): Secondary | ICD-10-CM | POA: Diagnosis not present

## 2015-03-08 ENCOUNTER — Other Ambulatory Visit: Payer: Self-pay | Admitting: Family

## 2015-03-10 ENCOUNTER — Telehealth: Payer: Self-pay | Admitting: Family

## 2015-03-10 NOTE — Telephone Encounter (Signed)
Pt called in and said the would like for Marya Amsler to maintains all his meds.  He would like a nurse to give him a call about his med list

## 2015-03-11 MED ORDER — LOSARTAN POTASSIUM-HCTZ 100-25 MG PO TABS: 1.0000 | ORAL_TABLET | Freq: Every day | ORAL | Status: DC

## 2015-03-11 MED ORDER — VENLAFAXINE HCL ER 37.5 MG PO CP24: 37.5000 mg | ORAL_CAPSULE | Freq: Every day | ORAL | Status: DC

## 2015-03-11 NOTE — Telephone Encounter (Signed)
Received call pt states he is needing refills on his venlafaxine, losartan, and tramadol. Inform pt on 02/11/15 Marya Amsler approved tramadol and was fax to [facility]. Pt states he hasn't heard or receive anything. Inform pt will follow-up on the tramadol, will send other two med electronically. Called [facility] spoke with pharmacist Lorynn verified if they receive script on 8/10 for tramadol. Lorynn stated they did not receive. Gave order verbally with greg approval..../lmb

## 2015-03-12 ENCOUNTER — Other Ambulatory Visit: Payer: Self-pay | Admitting: Family

## 2015-03-13 MED ORDER — TRAMADOL HCL 50 MG PO TABS: 50.0000 mg | ORAL_TABLET | Freq: Four times a day (QID) | ORAL | Status: DC | PRN

## 2015-03-13 NOTE — Addendum Note (Signed)
Addended by: Mauricio Po D on: 03/13/2015 05:17 PM   Modules accepted: Orders

## 2015-03-20 DIAGNOSIS — M47816 Spondylosis without myelopathy or radiculopathy, lumbar region: Secondary | ICD-10-CM | POA: Diagnosis not present

## 2015-04-07 DIAGNOSIS — Z970 Presence of artificial eye: Secondary | ICD-10-CM | POA: Diagnosis not present

## 2015-04-07 DIAGNOSIS — Z961 Presence of intraocular lens: Secondary | ICD-10-CM | POA: Diagnosis not present

## 2015-04-13 ENCOUNTER — Other Ambulatory Visit: Payer: Self-pay | Admitting: Family

## 2015-04-13 NOTE — Telephone Encounter (Signed)
Greg faxed to pharmacy.../lmb 

## 2015-04-14 ENCOUNTER — Other Ambulatory Visit: Payer: Self-pay | Admitting: Family

## 2015-04-15 NOTE — Telephone Encounter (Signed)
Please confirm with pharmacy as this medication was just refilled 2 days ago.

## 2015-04-21 ENCOUNTER — Telehealth: Payer: Self-pay | Admitting: *Deleted

## 2015-04-21 MED ORDER — POLYETHYLENE GLYCOL 3350 17 GM/SCOOP PO POWD: 17.0000 g | Freq: Every day | ORAL | Status: DC

## 2015-04-21 NOTE — Telephone Encounter (Signed)
Receive msg pt states he is needing 2 rx's sent to [facility] for Miralax & Tramadol. Pt states the directions on the tramadol is incorrect. He states he take 2 tabs TID and the quanity that was given is only lasting 45 days. Sent Miralax pls advise on Tramadol...Aaron [name]

## 2015-04-21 NOTE — Telephone Encounter (Signed)
The prescription for the Tramadol is the correct number that 2 pills at 3 times per day is 180 pills per month (6 pills per day x 30 days).

## 2015-04-22 MED ORDER — TRAMADOL HCL 50 MG PO TABS: 100.0000 mg | ORAL_TABLET | Freq: Three times a day (TID) | ORAL | Status: DC | PRN

## 2015-04-22 NOTE — Telephone Encounter (Signed)
Pt states the script that was sent to his local pharmacy stated to take 1 every 6 hours as needed. Pt is wanting a 90 day supply sent to his mail order [facility]. Pls advise...Johny Chess

## 2015-04-22 NOTE — Telephone Encounter (Signed)
Notified pt rx's has been fax to [place]...Aaron [name]

## 2015-04-22 NOTE — Telephone Encounter (Signed)
New prescription printed to be faxed to [facility].

## 2015-04-22 NOTE — Addendum Note (Signed)
Addended by: Mauricio Po D on: 04/22/2015 09:26 AM   Modules accepted: Orders

## 2015-04-27 ENCOUNTER — Other Ambulatory Visit: Payer: Self-pay | Admitting: Family

## 2015-05-16 ENCOUNTER — Other Ambulatory Visit: Payer: Self-pay | Admitting: Family

## 2015-05-18 NOTE — Telephone Encounter (Signed)
Last refill was 10/10

## 2015-05-25 ENCOUNTER — Telehealth: Payer: Self-pay | Admitting: Cardiology

## 2015-05-25 NOTE — Telephone Encounter (Signed)
Patient calling the office for samples of medication:   1.  What medication and dosage are you requesting samples for? Xarleto 20mg   2.  Are you currently out of this medication? Yes   * IF NOT HE WILL NEED A HANDWRITTEN PRESCRIPTION TO TAKE TO THE PHARMACY ALONG WITH HIS DISCOUNT CARD. HE JUST NEEDS ENOUGH TO GET HIM THROUGH THE YEAR BECAUSE HE IS IN THE [place]*

## 2015-05-25 NOTE — Telephone Encounter (Signed)
Returned call to patient.Xarelto 20 mg samples left at front desk [place] office.

## 2015-06-01 ENCOUNTER — Telehealth: Payer: Self-pay

## 2015-06-01 NOTE — Telephone Encounter (Signed)
Spoke to pt and would like to come in when spouse does for her appt and get his flu vaccine.

## 2015-06-14 ENCOUNTER — Other Ambulatory Visit: Payer: Self-pay | Admitting: Family

## 2015-06-15 NOTE — Telephone Encounter (Signed)
Last refill 11/14

## 2015-07-13 ENCOUNTER — Other Ambulatory Visit: Payer: Self-pay

## 2015-07-13 MED ORDER — OMEPRAZOLE 20 MG PO CPDR: 20.0000 mg | DELAYED_RELEASE_CAPSULE | Freq: Every day | ORAL | Status: DC

## 2015-07-13 MED ORDER — POLYETHYLENE GLYCOL 3350 17 GM/SCOOP PO POWD: 17.0000 g | Freq: Every day | ORAL | Status: DC

## 2015-07-13 MED ORDER — RIVAROXABAN 20 MG PO TABS: 20.0000 mg | ORAL_TABLET | Freq: Every day | ORAL | Status: DC

## 2015-07-13 MED ORDER — ALPRAZOLAM 0.5 MG PO TABS: ORAL_TABLET | ORAL | Status: DC

## 2015-07-13 MED ORDER — FENOFIBRATE 160 MG PO TABS: 160.0000 mg | ORAL_TABLET | Freq: Every day | ORAL | Status: DC

## 2015-07-13 NOTE — Telephone Encounter (Signed)
rx to [facility]:   xarelto 20 mg Omeprazole 20 mg miralax powder  There is another refill rq but also a formulary change. The fenofibrate 200 mg caps is going to a tier 3. rq for a change to Fenofibrate 160 mg tablet. (to [facility]).  Alprazolam to local pharmacy.

## 2015-07-13 NOTE — Telephone Encounter (Signed)
Medication changed and refills sent.

## 2015-07-13 NOTE — Telephone Encounter (Signed)
rq for #90 to mail order (when due) to save pt money.  pls advise.

## 2015-07-28 ENCOUNTER — Telehealth: Payer: Self-pay

## 2015-07-28 NOTE — Telephone Encounter (Signed)
Pt is requesting refill for tramadol. Last refill was 04/22/15. Please advise.

## 2015-07-29 MED ORDER — TRAMADOL HCL 50 MG PO TABS: 100.0000 mg | ORAL_TABLET | Freq: Three times a day (TID) | ORAL | Status: DC | PRN

## 2015-07-29 NOTE — Telephone Encounter (Signed)
Printed and signed, needs pickup and UDS.

## 2015-07-30 NOTE — Telephone Encounter (Signed)
LVM letting pt know.  

## 2015-08-14 ENCOUNTER — Other Ambulatory Visit: Payer: Self-pay | Admitting: Family

## 2015-08-17 NOTE — Telephone Encounter (Signed)
Last refill was 07/16/15

## 2015-08-20 ENCOUNTER — Encounter: Payer: Self-pay | Admitting: Family

## 2015-08-20 ENCOUNTER — Ambulatory Visit (INDEPENDENT_AMBULATORY_CARE_PROVIDER_SITE_OTHER): Payer: Medicare Other | Admitting: Family

## 2015-08-20 VITALS — BP 138/64 | HR 55 | Temp 98.0°F | Resp 16 | Ht 65.0 in | Wt 250.0 lb

## 2015-08-20 DIAGNOSIS — F411 Generalized anxiety disorder: Secondary | ICD-10-CM

## 2015-08-20 DIAGNOSIS — M25562 Pain in left knee: Secondary | ICD-10-CM | POA: Diagnosis not present

## 2015-08-20 NOTE — Progress Notes (Signed)
Subjective:    Patient ID: Aaron [name], male    DOB: September 18, 1945, 70 y.o.   MRN: [id number]  Chief Complaint  Patient presents with  . Medication Follow up    HPI:  Aaron [name] is a 70 y.o. male who  has a past medical history of Coronary artery disease; Hypertension; Hyperlipidemia; Depression; GERD (gastroesophageal reflux disease); Insomnia; Sleep apnea, obstructive; Pulmonary embolism ([place]) (~ 03/2006); Osteoarthritis; PONV (postoperative nausea and vomiting); Shortness of breath (06/08/11); Heart murmur; Blood transfusion; Anemia; Headache(784.0); and PTSD (post-traumatic stress disorder). and presents today for a follow up office visit.  1.) Left knee - Continues to experience the associated symptom of pain located in the left knee. He is scheduled to undergo total knee replacement by Dr. Berenice Primas of [facility]. He is currently maintained on both Tramadol and hydrocodone-acetaminophen which help to control his pain however the severity of the pain is enough to disturb his sleep pattern.  2.)  Anxiety - Currently maintained on Effexor and Xanax. Reports taking the medication slightly differently than prescribed. Takes the Effexor daily, however takes the alprazolam at night to sleep primarily.  Allergies  Allergen Reactions  . Amoxicillin     Rapid heart rate.     Outpatient Prescriptions Prior to Visit  Medication Sig Dispense Refill  . ALPRAZolam (XANAX) 0.5 MG tablet TAKE 1 TABLET 3 TIMES A DAY AS NEEDED FOR ANXIETY(DUE 07-16-15) 90 tablet 0  . amLODipine (NORVASC) 10 MG tablet TAKE 1 TABLET EVERY DAY 90 tablet 1  . atorvastatin (LIPITOR) 40 MG tablet Take 1 tablet (40 mg total) by mouth daily. 90 tablet 3  . fenofibrate 160 MG tablet Take 1 tablet (160 mg total) by mouth daily. 90 tablet 1  . GENTAK 0.3 % ophthalmic ointment Place 1 mL into both eyes daily.  1  . losartan-hydrochlorothiazide (HYZAAR) 100-25 MG per tablet Take 1 tablet by mouth daily. 90 tablet 3  .  omeprazole (PRILOSEC) 20 MG capsule Take 1 capsule (20 mg total) by mouth daily. 90 capsule 1  . polyethylene glycol powder (MIRALAX) powder Take 17 g by mouth daily. 357 g 3  . rivaroxaban (XARELTO) 20 MG TABS tablet Take 1 tablet (20 mg total) by mouth daily with supper. 90 tablet 1  . traMADol (ULTRAM) 50 MG tablet Take 2 tablets (100 mg total) by mouth 3 (three) times daily as needed for moderate pain or severe pain. 180 tablet 0  . trimethoprim-polymyxin b (POLYTRIM) ophthalmic solution Place 1 mL into both eyes daily.  1  . venlafaxine XR (EFFEXOR-XR) 37.5 MG 24 hr capsule Take 1 capsule (37.5 mg total) by mouth daily. 90 capsule 3   No facility-administered medications prior to visit.    Review of Systems  Constitutional: Negative for fever and chills.  Respiratory: Negative for chest tightness and shortness of breath.   Cardiovascular: Negative for chest pain, palpitations and leg swelling.  Musculoskeletal:       Positive for left knee pain.  Psychiatric/Behavioral: Positive for sleep disturbance. The patient is nervous/anxious.       Objective:    BP 138/64 mmHg  Pulse 55  Temp(Src) 98 F (36.7 C) (Oral)  Resp 16  Ht 5\' 5"  (1.651 m)  Wt 250 lb (113.399 kg)  BMI 41.60 kg/m2  SpO2 96% Nursing note and vital signs reviewed.  Physical Exam  Constitutional: He is oriented to person, place, and time. He appears well-developed and well-nourished. No distress.  Cardiovascular: Normal rate, regular rhythm,  normal heart sounds and intact distal pulses.   Pulmonary/Chest: Effort normal and breath sounds normal.  Musculoskeletal:  Left knee - mild/moderate inflammation with no obvious deformity or discoloration. Generalized tenderness with no specific point tenderness. Range of motion is limited secondary to discomfort. Strength testing is deferred. Ligamentous and meniscal testing result in discomfort with inability to determine specific results. Distal pulses and sensation are  intact and appropriate.  Neurological: He is alert and oriented to person, place, and time.  Skin: Skin is warm and dry.  Psychiatric: He has a normal mood and affect. His behavior is normal. Judgment and thought content normal.       Assessment & Plan:   Problem List Items Addressed This Visit      Other   Left knee pain - Primary    Left knee pain with failed conservative treatment scheduled for total knee replacement. Continue current dosage of hydrocodone-acetaminophen as needed for discomfort. Discussed importance of nonpharmacological therapy including ice and exercises. Knee braces as tolerated. Follow-up if symptoms worsen or fail to improve prior to surgery.      Generalized anxiety disorder    Generalized anxiety disorder with mild exacerbation secondary to upcoming surgery. Maintained on Effexor and alprazolam with mild control. No adverse side effects. Sleep disturbances most likely related to discomfort. Continue current dosage of Effexor and alprazolam.

## 2015-08-20 NOTE — Progress Notes (Signed)
Pre visit review using our clinic review tool, if applicable. No additional management support is needed unless otherwise documented below in the visit note. 

## 2015-08-20 NOTE — Assessment & Plan Note (Signed)
Generalized anxiety disorder with mild exacerbation secondary to upcoming surgery. Maintained on Effexor and alprazolam with mild control. No adverse side effects. Sleep disturbances most likely related to discomfort. Continue current dosage of Effexor and alprazolam.

## 2015-08-20 NOTE — Assessment & Plan Note (Signed)
Left knee pain with failed conservative treatment scheduled for total knee replacement. Continue current dosage of hydrocodone-acetaminophen as needed for discomfort. Discussed importance of nonpharmacological therapy including ice and exercises. Knee braces as tolerated. Follow-up if symptoms worsen or fail to improve prior to surgery.

## 2015-08-20 NOTE — Patient Instructions (Addendum)
Thank you for choosing [facility].  Summary/Instructions:  Please continue to take your medication as prescribed.  Follow up with Dr. Halford Chessman regarding your sleep.   Please use your pain medication as prescribed and needed.

## 2015-09-01 ENCOUNTER — Other Ambulatory Visit: Payer: Self-pay | Admitting: Cardiology

## 2015-09-01 NOTE — Telephone Encounter (Signed)
REFILL 

## 2015-09-04 ENCOUNTER — Telehealth: Payer: Self-pay

## 2015-09-04 NOTE — Telephone Encounter (Signed)
Received surgical clearance from [facility].Dr.Jordan cleared patient for surgery.Form faxed back to fax # [phone].

## 2015-09-08 ENCOUNTER — Other Ambulatory Visit: Payer: Self-pay | Admitting: Orthopedic Surgery

## 2015-09-15 ENCOUNTER — Other Ambulatory Visit: Payer: Self-pay | Admitting: Internal Medicine

## 2015-09-17 ENCOUNTER — Other Ambulatory Visit: Payer: Self-pay | Admitting: Family

## 2015-09-17 NOTE — Telephone Encounter (Signed)
Faxed script back to [facility].../lmb 

## 2015-09-25 ENCOUNTER — Ambulatory Visit (HOSPITAL_COMMUNITY)
Admission: RE | Admit: 2015-09-25 | Discharge: 2015-09-25 | Disposition: A | Discharge status: Home / Self Care | Payer: Medicare Other | Source: Ambulatory Visit | Attending: Orthopedic Surgery | Admitting: Orthopedic Surgery

## 2015-09-25 ENCOUNTER — Telehealth: Payer: Self-pay | Admitting: Cardiology

## 2015-09-25 ENCOUNTER — Encounter (HOSPITAL_COMMUNITY)
Admission: RE | Admit: 2015-09-25 | Discharge: 2015-09-25 | Disposition: A | Discharge status: Home / Self Care | Payer: Medicare Other | Source: Ambulatory Visit | Attending: Orthopedic Surgery | Admitting: Orthopedic Surgery

## 2015-09-25 ENCOUNTER — Encounter: Payer: Self-pay | Admitting: Cardiology

## 2015-09-25 ENCOUNTER — Encounter (HOSPITAL_COMMUNITY): Payer: Self-pay

## 2015-09-25 DIAGNOSIS — Z01818 Encounter for other preprocedural examination: Secondary | ICD-10-CM

## 2015-09-25 DIAGNOSIS — Z7901 Long term (current) use of anticoagulants: Secondary | ICD-10-CM | POA: Insufficient documentation

## 2015-09-25 DIAGNOSIS — E785 Hyperlipidemia, unspecified: Secondary | ICD-10-CM | POA: Diagnosis not present

## 2015-09-25 DIAGNOSIS — Z79899 Other long term (current) drug therapy: Secondary | ICD-10-CM | POA: Diagnosis not present

## 2015-09-25 DIAGNOSIS — I2510 Atherosclerotic heart disease of native coronary artery without angina pectoris: Secondary | ICD-10-CM | POA: Insufficient documentation

## 2015-09-25 DIAGNOSIS — Z955 Presence of coronary angioplasty implant and graft: Secondary | ICD-10-CM | POA: Insufficient documentation

## 2015-09-25 DIAGNOSIS — Z86711 Personal history of pulmonary embolism: Secondary | ICD-10-CM | POA: Insufficient documentation

## 2015-09-25 DIAGNOSIS — I10 Essential (primary) hypertension: Secondary | ICD-10-CM | POA: Diagnosis not present

## 2015-09-25 DIAGNOSIS — Z01812 Encounter for preprocedural laboratory examination: Secondary | ICD-10-CM | POA: Diagnosis not present

## 2015-09-25 DIAGNOSIS — M170 Bilateral primary osteoarthritis of knee: Secondary | ICD-10-CM | POA: Diagnosis not present

## 2015-09-25 DIAGNOSIS — Z87891 Personal history of nicotine dependence: Secondary | ICD-10-CM | POA: Diagnosis not present

## 2015-09-25 DIAGNOSIS — Z0183 Encounter for blood typing: Secondary | ICD-10-CM | POA: Diagnosis not present

## 2015-09-25 DIAGNOSIS — G4733 Obstructive sleep apnea (adult) (pediatric): Secondary | ICD-10-CM | POA: Insufficient documentation

## 2015-09-25 DIAGNOSIS — J449 Chronic obstructive pulmonary disease, unspecified: Secondary | ICD-10-CM | POA: Diagnosis not present

## 2015-09-25 HISTORY — DX: Malignant (primary) neoplasm, unspecified: C80.1

## 2015-09-25 HISTORY — DX: Drug induced constipation: K59.03

## 2015-09-25 HISTORY — DX: Other seasonal allergic rhinitis: J30.2

## 2015-09-25 HISTORY — DX: Anxiety disorder, unspecified: F41.9

## 2015-09-25 HISTORY — DX: Psoriasis, unspecified: L40.9

## 2015-09-25 LAB — TYPE AND SCREEN
ABO/RH(D): O POS
Antibody Screen: NEGATIVE

## 2015-09-25 LAB — COMPREHENSIVE METABOLIC PANEL
ALK PHOS: 63 U/L (ref 38–126)
ALT: 17 U/L (ref 17–63)
ANION GAP: 10 (ref 5–15)
AST: 20 U/L (ref 15–41)
Albumin: 4.2 g/dL (ref 3.5–5.0)
BILIRUBIN TOTAL: 0.1 mg/dL — AB (ref 0.3–1.2)
BUN: 18 mg/dL (ref 6–20)
CALCIUM: 9.2 mg/dL (ref 8.9–10.3)
CO2: 25 mmol/L (ref 22–32)
Chloride: 102 mmol/L (ref 101–111)
Creatinine, Ser: 1.13 mg/dL (ref 0.61–1.24)
Glucose, Bld: 85 mg/dL (ref 65–99)
Potassium: 3.7 mmol/L (ref 3.5–5.1)
Sodium: 137 mmol/L (ref 135–145)
TOTAL PROTEIN: 7.2 g/dL (ref 6.5–8.1)

## 2015-09-25 LAB — URINALYSIS, ROUTINE W REFLEX MICROSCOPIC
Bilirubin Urine: NEGATIVE
GLUCOSE, UA: NEGATIVE mg/dL
HGB URINE DIPSTICK: NEGATIVE
KETONES UR: NEGATIVE mg/dL
Leukocytes, UA: NEGATIVE
Nitrite: NEGATIVE
PROTEIN: NEGATIVE mg/dL
SPECIFIC GRAVITY, URINE: 1.012 (ref 1.005–1.030)
pH: 6 (ref 5.0–8.0)

## 2015-09-25 LAB — CBC WITH DIFFERENTIAL/PLATELET
Basophils Absolute: 0.1 10*3/uL (ref 0.0–0.1)
Basophils Relative: 1 %
Eosinophils Absolute: 0.2 10*3/uL (ref 0.0–0.7)
Eosinophils Relative: 4 %
HEMATOCRIT: 37.2 % — AB (ref 39.0–52.0)
HEMOGLOBIN: 12.6 g/dL — AB (ref 13.0–17.0)
LYMPHS ABS: 2 10*3/uL (ref 0.7–4.0)
Lymphocytes Relative: 30 %
MCH: 28.8 pg (ref 26.0–34.0)
MCHC: 33.9 g/dL (ref 30.0–36.0)
MCV: 84.9 fL (ref 78.0–100.0)
MONO ABS: 0.8 10*3/uL (ref 0.1–1.0)
MONOS PCT: 12 %
NEUTROS ABS: 3.7 10*3/uL (ref 1.7–7.7)
NEUTROS PCT: 55 %
Platelets: 226 10*3/uL (ref 150–400)
RBC: 4.38 MIL/uL (ref 4.22–5.81)
RDW: 12.7 % (ref 11.5–15.5)
WBC: 6.7 10*3/uL (ref 4.0–10.5)

## 2015-09-25 LAB — ABO/RH: ABO/RH(D): O POS

## 2015-09-25 LAB — SURGICAL PCR SCREEN
MRSA, PCR: NEGATIVE
Staphylococcus aureus: POSITIVE — AB

## 2015-09-25 LAB — APTT: aPTT: 34 seconds (ref 24–37)

## 2015-09-25 LAB — PROTIME-INR
INR: 1.18 (ref 0.00–1.49)
PROTHROMBIN TIME: 15.2 s (ref 11.6–15.2)

## 2015-09-25 NOTE — Progress Notes (Signed)
I called a prescription for Mupirocin ointment to [address] [place], [place].

## 2015-09-25 NOTE — Progress Notes (Signed)
Mr Aaron [name] is chest pain or shortness of breath.  I explained to patient and wife that we have no instructions regarding when to stop Xarelto, but that I have been in touch with Elmyra Ricks at Dr Berenice Primas office. I told the [employer] that they would receive information on when to stop it from Dr Berenice Primas office.

## 2015-09-25 NOTE — Pre-Procedure Instructions (Signed)
    ARCADIO CRUTE  09/25/2015   Report to [facility] Admitting Friday, April 7at 5:30 AM               Your surgery or procedure is scheduled for  7:30 AM  Call this number if you have problems the morning of [phone]   Remember:  Do not eat food or drink liquids after midnight Thursday, March 6.  Take these medicines the morning of surgery with A SIP OF WATER Alprazolam(Xanax), Pain Pill(if needed),  Omeprazole(Prilosec),     Eye Drops(if needed), and Effexor(Venlafaxine)             No Goody's,BC's,Aleve,Aspirin,Ibuprofen,Advil,Motrin,Fish Oil,or any Herbal Medications.               Stop Xarelto as instructed by Dr Berenice Primas.  ( waiting on instructions from Dr Berenice Primas)   Do not wear jewelry.  Do not wear lotions, powders, or colognes.               Men may shave face and neck.  Do not bring valuables to the hospital.  [facility] is not responsible for any belongings or valuables.  Contacts, dentures or bridgework may not be worn into surgery.  Leave your suitcase in the car.  After surgery it may be brought to your room.  For patients admitted to the hospital, discharge time will be determined by your treatment team.  Patients discharged the day of surgery will not be allowed to drive home.   Please read over the following fact sheets that you were given. Pain Booklet, Coughing and Deep Breathing, Blood Transfusion Information, MRSA Information and Surgical Site Infection Prevention

## 2015-09-25 NOTE — Progress Notes (Signed)
I called Dr Berenice Primas office and spoke with Elmyra Ricks and asked about when patient should stop Xarelto.  Elmyra Ricks replied that they have clearance from Dr Martinique, but there was no information regarding Xarelto, I asked her to find out from Dr Berenice Primas and Dr Martinique what patient needs to do and let PAT and patient know.

## 2015-09-25 NOTE — Pre-Procedure Instructions (Signed)
    KAOS HOYE  09/25/2015      [id number] [place], [place] [id number] [place] [id number] Phone: [phone] Fax: [phone]  [place] [address] [place] [address] [place] [address] Phone: [phone] Fax: [phone]    Your procedure is scheduled on Fri, April 7 @ 7:30 AM  Report to [place] at 5:30 AM  Call this number if you have problems the morning of surgery:  [phone]   Remember:  Do not eat food or drink liquids after midnight.  Take these medicines the morning of surgery with A SIP OF WATER Alprazolam(Xanax),Pain Pill(if needed),Omeprazole(Prilosec),Eye Drops(if needed),and Effexor(Venlafaxine)             No Goody's,BC's,Aleve,Aspirin,Ibuprofen,Advil,Motrin,Fish Oil,or any Herbal Medications.                  Do not wear jewelry.  Do not wear lotions, powders, or colognes.  You may wear deodorant.             Men may shave face and neck.  Do not bring valuables to the hospital.  [facility] is not responsible for any belongings or valuables.  Contacts, dentures or bridgework may not be worn into surgery.  Leave your suitcase in the car.  After surgery it may be brought to your room.  For patients admitted to the hospital, discharge time will be determined by your treatment team.  Patients discharged the day of surgery will not be allowed to drive home.      Please read over the following fact sheets that you were given. Pain Booklet, Coughing and Deep Breathing, Blood Transfusion Information, MRSA Information and Surgical Site Infection Prevention

## 2015-09-25 NOTE — Pre-Procedure Instructions (Signed)
    Aaron [name]  09/25/2015   Report to [facility] Admitting at 5:30 AM               Your surgery or procedure is scheduled for  7:30 AM  Call this number if you have problems the morning of surgery:[phone]   Remember:  Do not eat food or drink liquids after midnight Thursday, March 6.  Take these medicines the morning of surgery with A SIP OF WATER Alprazolam(Xanax),Pain Pill(if needed),Omeprazole(Prilosec),Eye Drops(if needed),and Effexor(Venlafaxine)             No Goody's,BC's,Aleve,Aspirin,Ibuprofen,Advil,Motrin,Fish Oil,or any Herbal Medications.                  Do not wear jewelry.  Do not wear lotions, powders, or colognes.  You may wear deodorant.             Men may shave face and neck.  Do not bring valuables to the hospital.  [facility] is not responsible for any belongings or valuables.  Contacts, dentures or bridgework may not be worn into surgery.  Leave your suitcase in the car.  After surgery it may be brought to your room.  For patients admitted to the hospital, discharge time will be determined by your treatment team.  Patients discharged the day of surgery will not be allowed to drive home.      Please read over the following fact sheets that you were given. Pain Booklet, Coughing and Deep Breathing, Blood Transfusion Information, MRSA Information and Surgical Site Infection Prevention

## 2015-09-25 NOTE — Telephone Encounter (Signed)
Pt was cleared

## 2015-09-29 NOTE — Progress Notes (Addendum)
Anesthesia Chart Review:  Pt is a 70 year old male scheduled for L total knee arthroplasty on 10/09/2015 with Dr. Berenice Primas.   Cardiologist is Dr. Peter Martinique who cleared pt for surgery.   PMH includes:  CAD (DES to LAD 2005), HTN, hyperlipidemia, OSA, PE (2007), heart murmur, PTDS, post-op N/V. Former smoker. BMI 41  Medications include: lipitor, fenofibrate, losartan-hctz, prilosec, xarelto.   Preoperative labs reviewed.    Chest x-ray 09/25/15 reviewed. COPD with pulmonary fibrotic changes. No evidence of pneumonia nor CHF.  EKG 11/27/14: NSR.   Nuclear stress test 12/04/12: Normal stress nuclear study. There is no scar or ischemia. This is a low risk scan. LV Ejection Fraction: 60%. LV Wall Motion: Normal Wall Motion.  Cardiac cath 02/13/04:  1. Single vessel obstructive coronary artery disease. 2. Successful stenting of the mid left anterior descending.  Willeen Cass, FNP-BC [facility] Short Stay Surgical Center/Anesthesiology Phone: [phone] 09/29/2015 4:48 PM  Addendum:  06/09/11 Echo (done during admission for recurrent PE) Study Conclusions: - Left ventricle: The cavity size was normal. Wall thickness was normal. - Right ventricle: RV appears mildly dilated. RV function appears mild to moderately depressed. Poor windows limit study. The cavity size was mildly dilated. Systolic function was moderately reduced. - Impressions: Recommend repeat evaluation with echo contrast to get better evaluation of function and wall motion.  Normal spirometry 07/20/11.  10/06/13 Sleep Study (Dr. Halford Chessman): MildOSA with AHI of 5, and SaO2 low of 90%. Of note is that his RDI was 61.4. Additional therapies include weight loss, CPAP, oral appliance, or surgical evaluation.  Jonah Blue, FNP had asked Dr. Martinique if patient could hold Xarelto for three days prior to surgery, so spinal anesthesia could be considered. This was okay with Dr. Martinique, and his office has notified patient. Definitive  anesthesia plan per his anesthesiologist on the day of surgery.  [facility] Short Stay Center/Anesthesiology Phone [phone] 10/05/2015 9:27 AM

## 2015-09-30 NOTE — Telephone Encounter (Signed)
He may stop Xarelto 3 days prior to surgery  Peter Martinique MD, [facility]

## 2015-09-30 NOTE — Telephone Encounter (Addendum)
Follow up      Calling because clearance did not say whether pt can stop his xarelto 3 days prior to 10-09-15 surgery.  Please call Angela @[address] pre admissions and let her know what Dr [phone] said.

## 2015-09-30 NOTE — Telephone Encounter (Signed)
Message sent to Dr.Jordan for advice. 

## 2015-10-01 NOTE — Telephone Encounter (Signed)
Spoke to patient 09/30/15 Dr.Jordan advised ok to hold Xarelto 3 days prior to surgery.  Left message on Angela's voice mail at [facility] pre admissions ok for pt to hold xarelto 3 days prior to surgery.

## 2015-10-08 MED ORDER — CLINDAMYCIN PHOSPHATE 900 MG/50ML IV SOLN
900.0000 mg | INTRAVENOUS | Status: AC
  Administered 2015-10-09: 900 mg via INTRAVENOUS
  Filled 2015-10-08: qty 50

## 2015-10-08 NOTE — Anesthesia Preprocedure Evaluation (Addendum)
Anesthesia Evaluation  Patient identified by MRN, date of birth, ID band Patient awake    Reviewed: Allergy & Precautions, H&P , NPO status , Patient's Chart, lab work & pertinent test results  History of Anesthesia Complications (+) PONV  Airway Mallampati: III  TM Distance: >3 FB Neck ROM: Full    Dental no notable dental hx. (+) Edentulous Upper, Edentulous Lower, Dental Advisory Given   Pulmonary sleep apnea and Continuous Positive Airway Pressure Ventilation , former smoker,    Pulmonary exam normal breath sounds clear to auscultation       Cardiovascular hypertension, Pt. on medications + CAD and + Cardiac Stents   Rhythm:Regular Rate:Normal     Neuro/Psych  Headaches, Anxiety Depression    GI/Hepatic Neg liver ROS, GERD  Medicated and Controlled,  Endo/Other  Morbid obesity  Renal/GU negative Renal ROS  negative genitourinary   Musculoskeletal  (+) Arthritis , Osteoarthritis,    Abdominal   Peds  Hematology negative hematology ROS (+)   Anesthesia Other Findings   Reproductive/Obstetrics negative OB ROS                            Anesthesia Physical Anesthesia Plan  ASA: III  Anesthesia Plan: Spinal   Post-op Pain Management:    Induction: Intravenous  Airway Management Planned: Simple Face Mask  Additional Equipment:   Intra-op Plan:   Post-operative Plan:   Informed Consent: I have reviewed the patients History and Physical, chart, labs and discussed the procedure including the risks, benefits and alternatives for the proposed anesthesia with the patient or authorized representative who has indicated his/her understanding and acceptance.   Dental advisory given  Plan Discussed with: CRNA  Anesthesia Plan Comments:         Anesthesia Quick Evaluation

## 2015-10-08 NOTE — H&P (Addendum)
TOTAL KNEE ADMISSION H&P  Patient is being admitted for left total knee arthroplasty.  Subjective:  Chief Complaint:left knee pain.  HPI: Aaron [name], 70 y.o. male, has a history of pain and functional disability in the left knee due to arthritis and has failed non-surgical conservative treatments for greater than 12 weeks to includeNSAID's and/or analgesics, corticosteriod injections, viscosupplementation injections, use of assistive devices and activity modification.  Onset of symptoms was gradual, starting 8 years ago with gradually worsening course since that time. The patient noted no past surgery on the left knee(s).  Patient currently rates pain in the left knee(s) at 9 out of 10 with activity. Patient has night pain, worsening of pain with activity and weight bearing, pain that interferes with activities of daily living, pain with passive range of motion and joint swelling.  Patient has evidence of subchondral cysts, subchondral sclerosis, joint subluxation and joint space narrowing by imaging studies. This patient has had failure of all reasonable conservative care. There is no active infection.  Patient Active Problem List   Diagnosis Date Noted  . Left knee pain 08/20/2015  . Generalized anxiety disorder 08/20/2015  . Routine general medical examination at a health care facility 02/11/2015  . Medicare annual wellness visit, subsequent 02/11/2015  . Long term current use of anticoagulant 06/14/2011  . Coronary artery disease   . Hypertension   . Hyperlipidemia   . Depression   . GERD (gastroesophageal reflux disease)   . Insomnia   . Sleep apnea, obstructive   . Pulmonary embolism [facility])    Past Medical History  Diagnosis Date  . Coronary artery disease     DES LAD 2005  . Hypertension   . Hyperlipidemia   . Depression   . GERD (gastroesophageal reflux disease)   . Insomnia   . Sleep apnea, obstructive   . Pulmonary embolism ([place]) ~ 03/2006    bilaterally  .  Osteoarthritis   . Shortness of breath 06/08/11    "w/exertion; that's why I'm here"  . Heart murmur   . Blood transfusion     "@ birth"  . Anemia     "@ birth"  . 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 PTSD (post-traumatic stress disorder)   . Seasonal allergies   . PONV (postoperative nausea and vomiting)     2006 - knee surgery  . Anxiety   . Constipation due to pain medication   . Cancer ([place])     Head - skin cancer  . Psoriasis     Past Surgical History  Procedure Laterality Date  . Spinal injections  2006-2007    "maybe 10 injections for herniated discs"  . Carpel tunnel release Bilateral ~ 06/2005/~ 08/2005    right/left  . Coronary stent placement  02/13/2004    1  . Inguinal hernia repair Right 1983  . Inguinal hernia repair Left 1985  . Tonsillectomy      "when I was real young"  . Eye surgery      age 64; "ruptured  right pupil"  . Eye surgery      age 35  . Enucleation Right     age 69  . Eye surgery  1957    placed artificial right eye  . Cardiac catheterization    . Knee arthroscopy  ~ 06/2005    right  . Coronary angioplasty      No prescriptions prior to admission   Allergies  Allergen Reactions  . Wellbutrin [Bupropion] Other (See Comments)    Hallucinations, sweating  . Amoxicillin Palpitations    Social History  Substance Use Topics  . Smoking status: Former Smoker -- 1.50 packs/day for 25 years    Types: Cigarettes    Quit date: 07/10/1995  . Smokeless tobacco: Never Used  . Alcohol Use: No    Family History  Problem Relation Age of Onset  . Breast cancer Sister   . Liver cancer Sister   . Asthma Father   . Asthma Sister   . Asthma Brother   . Emphysema Father      ROS ROS: I have reviewed the patient's review of systems thoroughly and there are no positive responses as relates to the HPI. Objective:  Physical Exam  Vital signs in last 24 hours:    Filed Vitals:   10/09/15 0636  BP: 158/66   Pulse: 73  Temp: 98.1 F (36.7 C)  Resp: 18    Filed Vitals:   10/09/15 0636  BP: 158/66  Pulse: 73  Temp: 98.1 F (36.7 C)  Resp: 18   Well-developed well-nourished patient in no acute distress. Alert and oriented x3 HEENT:within normal limits Cardiac: Regular rate and rhythm Pulmonary: Lungs clear to auscultation Abdomen: Soft and nontender.  Normal active bowel sounds  Musculoskeletal: (left knee: Painful range of motion.  2+ effusion.  No instability.  Pain to range of motion. Labs: Recent Results (from the past 2160 hour(s))  Type and screen Order type and screen if day of surgery is less than 15 days from draw of preadmission visit or order morning of surgery if day of surgery is greater than 6 days from preadmission visit.     Status: None   Collection Time: 09/25/15  3:21 PM  Result Value Ref Range   ABO/RH(D) O POS    Antibody Screen NEG    Sample Expiration 10/09/2015    Extend sample reason NO TRANSFUSIONS OR PREGNANCY IN THE PAST 3 MONTHS   APTT     Status: None   Collection Time: 09/25/15  3:22 PM  Result Value Ref Range   aPTT 34 24 - 37 seconds  CBC WITH DIFFERENTIAL     Status: Abnormal   Collection Time: 09/25/15  3:22 PM  Result Value Ref Range   WBC 6.7 4.0 - 10.5 K/uL   RBC 4.38 4.22 - 5.81 MIL/uL   Hemoglobin 12.6 (L) 13.0 - 17.0 g/dL   HCT 37.2 (L) 39.0 - 52.0 %   MCV 84.9 78.0 - 100.0 fL   MCH 28.8 26.0 - 34.0 pg   MCHC 33.9 30.0 - 36.0 g/dL   RDW 12.7 11.5 - 15.5 %   Platelets 226 150 - 400 K/uL   Neutrophils Relative % 55 %   Neutro Abs 3.7 1.7 - 7.7 K/uL   Lymphocytes Relative 30 %   Lymphs Abs 2.0 0.7 - 4.0 K/uL   Monocytes Relative 12 %   Monocytes Absolute 0.8 0.1 - 1.0 K/uL   Eosinophils Relative 4 %   Eosinophils Absolute 0.2 0.0 - 0.7 K/uL   Basophils Relative 1 %   Basophils Absolute 0.1 0.0 - 0.1 K/uL  Comprehensive metabolic panel     Status: Abnormal   Collection Time: 09/25/15  3:22 PM  Result Value Ref Range   Sodium  137 135 - 145 mmol/L   Potassium 3.7 3.5 - 5.1 mmol/L   Chloride 102 101 - 111 mmol/L   CO2 25 22 -   32 mmol/L   Glucose, Bld 85 65 - 99 mg/dL   BUN 18 6 - 20 mg/dL   Creatinine, Ser 1.13 0.61 - 1.24 mg/dL   Calcium 9.2 8.9 - 10.3 mg/dL   Total Protein 7.2 6.5 - 8.1 g/dL   Albumin 4.2 3.5 - 5.0 g/dL   AST 20 15 - 41 U/L   ALT 17 17 - 63 U/L   Alkaline Phosphatase 63 38 - 126 U/L   Total Bilirubin 0.1 (L) 0.3 - 1.2 mg/dL   GFR calc non Af Amer >60 >60 mL/min   GFR calc Af Amer >60 >60 mL/min    Comment: (NOTE) The eGFR has been calculated using the CKD EPI equation. This calculation has not been validated in all clinical situations. eGFR's persistently <60 mL/min signify possible Chronic Kidney Disease.    Anion gap 10 5 - 15  Protime-INR     Status: None   Collection Time: 09/25/15  3:22 PM  Result Value Ref Range   Prothrombin Time 15.2 11.6 - 15.2 seconds   INR 1.18 0.00 - 1.49  Urinalysis, Routine w reflex microscopic (not at [facility])     Status: None   Collection Time: 09/25/15  3:22 PM  Result Value Ref Range   Color, Urine YELLOW YELLOW   APPearance CLEAR CLEAR   Specific Gravity, Urine 1.012 1.005 - 1.030   pH 6.0 5.0 - 8.0   Glucose, UA NEGATIVE NEGATIVE mg/dL   Hgb urine dipstick NEGATIVE NEGATIVE   Bilirubin Urine NEGATIVE NEGATIVE   Ketones, ur NEGATIVE NEGATIVE mg/dL   Protein, ur NEGATIVE NEGATIVE mg/dL   Nitrite NEGATIVE NEGATIVE   Leukocytes, UA NEGATIVE NEGATIVE    Comment: MICROSCOPIC NOT DONE ON URINES WITH NEGATIVE PROTEIN, BLOOD, LEUKOCYTES, NITRITE, OR GLUCOSE <1000 mg/dL.  Surgical pcr screen     Status: Abnormal   Collection Time: 09/25/15  3:46 PM  Result Value Ref Range   MRSA, PCR NEGATIVE NEGATIVE   Staphylococcus aureus POSITIVE (A) NEGATIVE    Comment:        The Xpert SA Assay (FDA approved for NASAL specimens in patients over 21 years of age), is one component of a comprehensive surveillance program.  Test performance has been  validated by [facility] for patients greater than or equal to 1 year old. It is not intended to diagnose infection nor to guide or monitor treatment.   ABO/Rh     Status: None   Collection Time: 09/25/15  3:47 PM  Result Value Ref Range   ABO/RH(D) O POS     Estimated body mass index is 41.60 kg/(m^2) as calculated from the following:   Height as of 08/20/15: 5' 5" (1.651 m).   Weight as of 08/20/15: 113.399 kg (250 lb).   Imaging Review Plain radiographs demonstrate severe degenerative joint disease of the left knee(s). The overall alignment ismild varus. The bone quality appears to be good for age and reported activity level.  Assessment/Plan:  End stage arthritis, left knee   The patient history, physical examination, clinical judgment of the provider and imaging studies are consistent with end stage degenerative joint disease of the left knee(s) and total knee arthroplasty is deemed medically necessary. The treatment options including medical management, injection therapy arthroscopy and arthroplasty were discussed at length. The risks and benefits of total knee arthroplasty were presented and reviewed. The risks due to aseptic loosening, infection, stiffness, patella tracking problems, thromboembolic complications and other imponderables were discussed. The patient acknowledged the explanation, agreed   to proceed with the plan and consent was signed. Patient is being admitted for inpatient treatment for surgery, pain control, PT, OT, prophylactic antibiotics, VTE prophylaxis, progressive ambulation and ADL's and discharge planning. The patient is planning to be discharged home with home health services

## 2015-10-09 ENCOUNTER — Inpatient Hospital Stay (HOSPITAL_COMMUNITY)
Admission: RE | Admit: 2015-10-09 | Discharge: 2015-10-11 | DRG: 470 | Disposition: A | Discharge status: Home Health | Payer: Medicare Other | Source: Ambulatory Visit | Attending: Orthopedic Surgery | Admitting: Orthopedic Surgery

## 2015-10-09 ENCOUNTER — Inpatient Hospital Stay (HOSPITAL_COMMUNITY): Payer: Medicare Other | Admitting: Certified Registered"

## 2015-10-09 ENCOUNTER — Inpatient Hospital Stay (HOSPITAL_COMMUNITY): Payer: Medicare Other | Admitting: Emergency Medicine

## 2015-10-09 ENCOUNTER — Encounter (HOSPITAL_COMMUNITY): Payer: Self-pay | Admitting: *Deleted

## 2015-10-09 ENCOUNTER — Encounter (HOSPITAL_COMMUNITY)
Admission: RE | Disposition: A | Discharge status: Home Health | Payer: Self-pay | Source: Ambulatory Visit | Attending: Orthopedic Surgery

## 2015-10-09 DIAGNOSIS — F411 Generalized anxiety disorder: Secondary | ICD-10-CM | POA: Diagnosis present

## 2015-10-09 DIAGNOSIS — G4733 Obstructive sleep apnea (adult) (pediatric): Secondary | ICD-10-CM | POA: Diagnosis present

## 2015-10-09 DIAGNOSIS — D62 Acute posthemorrhagic anemia: Secondary | ICD-10-CM | POA: Diagnosis not present

## 2015-10-09 DIAGNOSIS — Z86711 Personal history of pulmonary embolism: Secondary | ICD-10-CM | POA: Diagnosis not present

## 2015-10-09 DIAGNOSIS — F329 Major depressive disorder, single episode, unspecified: Secondary | ICD-10-CM | POA: Diagnosis present

## 2015-10-09 DIAGNOSIS — K219 Gastro-esophageal reflux disease without esophagitis: Secondary | ICD-10-CM | POA: Diagnosis present

## 2015-10-09 DIAGNOSIS — F4310 Post-traumatic stress disorder, unspecified: Secondary | ICD-10-CM | POA: Diagnosis present

## 2015-10-09 DIAGNOSIS — Z87891 Personal history of nicotine dependence: Secondary | ICD-10-CM | POA: Diagnosis not present

## 2015-10-09 DIAGNOSIS — I2510 Atherosclerotic heart disease of native coronary artery without angina pectoris: Secondary | ICD-10-CM | POA: Diagnosis present

## 2015-10-09 DIAGNOSIS — R51 Headache: Secondary | ICD-10-CM | POA: Diagnosis present

## 2015-10-09 DIAGNOSIS — Z85828 Personal history of other malignant neoplasm of skin: Secondary | ICD-10-CM | POA: Diagnosis not present

## 2015-10-09 DIAGNOSIS — Z6841 Body Mass Index (BMI) 40.0 and over, adult: Secondary | ICD-10-CM

## 2015-10-09 DIAGNOSIS — Z91048 Other nonmedicinal substance allergy status: Secondary | ICD-10-CM

## 2015-10-09 DIAGNOSIS — Z888 Allergy status to other drugs, medicaments and biological substances status: Secondary | ICD-10-CM

## 2015-10-09 DIAGNOSIS — M25562 Pain in left knee: Secondary | ICD-10-CM | POA: Diagnosis present

## 2015-10-09 DIAGNOSIS — M170 Bilateral primary osteoarthritis of knee: Secondary | ICD-10-CM | POA: Diagnosis present

## 2015-10-09 DIAGNOSIS — Z7901 Long term (current) use of anticoagulants: Secondary | ICD-10-CM

## 2015-10-09 DIAGNOSIS — M1712 Unilateral primary osteoarthritis, left knee: Secondary | ICD-10-CM | POA: Diagnosis present

## 2015-10-09 DIAGNOSIS — Z8249 Family history of ischemic heart disease and other diseases of the circulatory system: Secondary | ICD-10-CM

## 2015-10-09 DIAGNOSIS — Z9861 Coronary angioplasty status: Secondary | ICD-10-CM

## 2015-10-09 DIAGNOSIS — E785 Hyperlipidemia, unspecified: Secondary | ICD-10-CM | POA: Diagnosis present

## 2015-10-09 DIAGNOSIS — I10 Essential (primary) hypertension: Secondary | ICD-10-CM | POA: Diagnosis present

## 2015-10-09 HISTORY — PX: TOTAL KNEE ARTHROPLASTY: SHX125

## 2015-10-09 SURGERY — ARTHROPLASTY, KNEE, TOTAL
Anesthesia: Spinal | Site: Knee | Laterality: Left

## 2015-10-09 MED ORDER — ALPRAZOLAM 0.5 MG PO TABS
1.0000 mg | ORAL_TABLET | Freq: Every evening | ORAL | Status: DC | PRN
  Administered 2015-10-10: 1 mg via ORAL
  Filled 2015-10-09: qty 2

## 2015-10-09 MED ORDER — OXYCODONE-ACETAMINOPHEN 5-325 MG PO TABS: 1.0000 | ORAL_TABLET | ORAL | Status: DC | PRN

## 2015-10-09 MED ORDER — METHOCARBAMOL 500 MG PO TABS
500.0000 mg | ORAL_TABLET | Freq: Four times a day (QID) | ORAL | Status: DC | PRN
  Administered 2015-10-09 – 2015-10-11 (×4): 500 mg via ORAL
  Filled 2015-10-09 (×4): qty 1

## 2015-10-09 MED ORDER — CHLORHEXIDINE GLUCONATE 4 % EX LIQD: 60.0000 mL | Freq: Once | CUTANEOUS | Status: DC

## 2015-10-09 MED ORDER — VENLAFAXINE HCL ER 37.5 MG PO CP24
37.5000 mg | ORAL_CAPSULE | Freq: Every day | ORAL | Status: DC
  Administered 2015-10-10 – 2015-10-11 (×2): 37.5 mg via ORAL
  Filled 2015-10-09 (×2): qty 1

## 2015-10-09 MED ORDER — POLYETHYLENE GLYCOL 3350 17 GM/SCOOP PO POWD: 17.0000 g | Freq: Every day | ORAL | Status: DC

## 2015-10-09 MED ORDER — DIPHENHYDRAMINE HCL 12.5 MG/5ML PO ELIX
12.5000 mg | ORAL_SOLUTION | ORAL | Status: DC | PRN
  Administered 2015-10-09: 25 mg via ORAL
  Filled 2015-10-09: qty 10

## 2015-10-09 MED ORDER — KETOROLAC TROMETHAMINE 15 MG/ML IJ SOLN
15.0000 mg | Freq: Four times a day (QID) | INTRAMUSCULAR | Status: AC
  Administered 2015-10-09 (×2): 15 mg via INTRAVENOUS
  Filled 2015-10-09 (×2): qty 1

## 2015-10-09 MED ORDER — ZOLPIDEM TARTRATE 5 MG PO TABS: 5.0000 mg | ORAL_TABLET | Freq: Every evening | ORAL | Status: DC | PRN

## 2015-10-09 MED ORDER — 0.9 % SODIUM CHLORIDE (POUR BTL) OPTIME
TOPICAL | Status: DC | PRN
  Administered 2015-10-09: 1000 mL

## 2015-10-09 MED ORDER — MIDAZOLAM HCL 5 MG/5ML IJ SOLN
INTRAMUSCULAR | Status: DC | PRN
  Administered 2015-10-09: 2 mg via INTRAVENOUS

## 2015-10-09 MED ORDER — DOCUSATE SODIUM 100 MG PO CAPS
100.0000 mg | ORAL_CAPSULE | Freq: Two times a day (BID) | ORAL | Status: DC
  Administered 2015-10-09 – 2015-10-10 (×4): 100 mg via ORAL
  Filled 2015-10-09 (×6): qty 1

## 2015-10-09 MED ORDER — POLYETHYLENE GLYCOL 3350 17 G PO PACK
17.0000 g | PACK | Freq: Every day | ORAL | Status: DC
  Administered 2015-10-09 – 2015-10-10 (×2): 17 g via ORAL
  Filled 2015-10-09 (×2): qty 1

## 2015-10-09 MED ORDER — MAGNESIUM CITRATE PO SOLN: 1.0000 | Freq: Once | ORAL | Status: DC | PRN

## 2015-10-09 MED ORDER — DEXAMETHASONE SODIUM PHOSPHATE 10 MG/ML IJ SOLN
10.0000 mg | Freq: Two times a day (BID) | INTRAMUSCULAR | Status: AC
  Administered 2015-10-09 (×2): 10 mg via INTRAVENOUS
  Filled 2015-10-09 (×2): qty 1

## 2015-10-09 MED ORDER — POLYETHYLENE GLYCOL 3350 17 G PO PACK
17.0000 g | PACK | Freq: Every day | ORAL | Status: DC | PRN
  Filled 2015-10-09: qty 1

## 2015-10-09 MED ORDER — HYDROMORPHONE HCL 1 MG/ML IJ SOLN
INTRAMUSCULAR | Status: AC
  Filled 2015-10-09: qty 1

## 2015-10-09 MED ORDER — OXYCODONE HCL 5 MG PO TABS
ORAL_TABLET | ORAL | Status: AC
  Filled 2015-10-09: qty 2

## 2015-10-09 MED ORDER — ATORVASTATIN CALCIUM 40 MG PO TABS
40.0000 mg | ORAL_TABLET | Freq: Every day | ORAL | Status: DC
  Administered 2015-10-09 – 2015-10-10 (×2): 40 mg via ORAL
  Filled 2015-10-09 (×2): qty 1

## 2015-10-09 MED ORDER — ONDANSETRON HCL 4 MG PO TABS
4.0000 mg | ORAL_TABLET | Freq: Four times a day (QID) | ORAL | Status: DC | PRN
Start: 2015-10-09 — End: 2015-10-11

## 2015-10-09 MED ORDER — CLINDAMYCIN PHOSPHATE 600 MG/50ML IV SOLN
600.0000 mg | Freq: Four times a day (QID) | INTRAVENOUS | Status: AC
  Administered 2015-10-09 (×2): 600 mg via INTRAVENOUS
  Filled 2015-10-09 (×2): qty 50

## 2015-10-09 MED ORDER — FENOFIBRATE 160 MG PO TABS
160.0000 mg | ORAL_TABLET | Freq: Every day | ORAL | Status: DC
  Administered 2015-10-09 – 2015-10-11 (×3): 160 mg via ORAL
  Filled 2015-10-09 (×3): qty 1

## 2015-10-09 MED ORDER — HYDROCHLOROTHIAZIDE 25 MG PO TABS
25.0000 mg | ORAL_TABLET | Freq: Every day | ORAL | Status: DC
  Administered 2015-10-09 – 2015-10-11 (×3): 25 mg via ORAL
  Filled 2015-10-09 (×3): qty 1

## 2015-10-09 MED ORDER — SODIUM CHLORIDE 0.9 % IR SOLN
Status: DC | PRN
  Administered 2015-10-09: 2000 mL

## 2015-10-09 MED ORDER — HYDROMORPHONE HCL 1 MG/ML IJ SOLN
1.0000 mg | INTRAMUSCULAR | Status: DC | PRN
  Administered 2015-10-09 (×2): 1 mg via INTRAVENOUS
  Filled 2015-10-09: qty 2
  Filled 2015-10-09: qty 1

## 2015-10-09 MED ORDER — OXYCODONE HCL 5 MG PO TABS
5.0000 mg | ORAL_TABLET | ORAL | Status: DC | PRN
  Administered 2015-10-09 – 2015-10-11 (×9): 10 mg via ORAL
  Filled 2015-10-09 (×9): qty 2

## 2015-10-09 MED ORDER — HYDROMORPHONE HCL 1 MG/ML IJ SOLN
0.2500 mg | INTRAMUSCULAR | Status: DC | PRN
  Administered 2015-10-09 (×4): 0.5 mg via INTRAVENOUS

## 2015-10-09 MED ORDER — BUPIVACAINE HCL (PF) 0.5 % IJ SOLN
INTRAMUSCULAR | Status: AC
  Filled 2015-10-09: qty 30

## 2015-10-09 MED ORDER — HYDROMORPHONE HCL 1 MG/ML IJ SOLN
INTRAMUSCULAR | Status: AC
Start: 2015-10-09 — End: 2015-10-10
  Filled 2015-10-09: qty 1

## 2015-10-09 MED ORDER — TIZANIDINE HCL 2 MG PO TABS: 2.0000 mg | ORAL_TABLET | Freq: Three times a day (TID) | ORAL | Status: DC | PRN

## 2015-10-09 MED ORDER — FENTANYL CITRATE (PF) 250 MCG/5ML IJ SOLN
INTRAMUSCULAR | Status: AC
  Filled 2015-10-09: qty 5

## 2015-10-09 MED ORDER — PANTOPRAZOLE SODIUM 40 MG PO TBEC
40.0000 mg | DELAYED_RELEASE_TABLET | Freq: Every day | ORAL | Status: DC
  Administered 2015-10-10 – 2015-10-11 (×2): 40 mg via ORAL
  Filled 2015-10-09 (×2): qty 1

## 2015-10-09 MED ORDER — BUPIVACAINE IN DEXTROSE 0.75-8.25 % IT SOLN
INTRATHECAL | Status: DC | PRN
  Administered 2015-10-09: 15 mg via INTRATHECAL

## 2015-10-09 MED ORDER — LOSARTAN POTASSIUM-HCTZ 100-25 MG PO TABS: 1.0000 | ORAL_TABLET | Freq: Every day | ORAL | Status: DC

## 2015-10-09 MED ORDER — RIVAROXABAN 20 MG PO TABS
20.0000 mg | ORAL_TABLET | Freq: Every day | ORAL | Status: DC
  Administered 2015-10-09 – 2015-10-10 (×2): 20 mg via ORAL
  Filled 2015-10-09 (×2): qty 1

## 2015-10-09 MED ORDER — KETOROLAC TROMETHAMINE 15 MG/ML IJ SOLN
INTRAMUSCULAR | Status: AC
  Filled 2015-10-09: qty 1

## 2015-10-09 MED ORDER — LACTATED RINGERS IV SOLN
INTRAVENOUS | Status: DC | PRN
  Administered 2015-10-09 (×2): via INTRAVENOUS

## 2015-10-09 MED ORDER — METHOCARBAMOL 500 MG PO TABS
ORAL_TABLET | ORAL | Status: AC
  Administered 2015-10-09: 500 mg
  Filled 2015-10-09: qty 1

## 2015-10-09 MED ORDER — ACETAMINOPHEN 650 MG RE SUPP: 650.0000 mg | Freq: Four times a day (QID) | RECTAL | Status: DC | PRN

## 2015-10-09 MED ORDER — BUPIVACAINE HCL (PF) 0.5 % IJ SOLN
INTRAMUSCULAR | Status: DC | PRN
  Administered 2015-10-09: 20 mL

## 2015-10-09 MED ORDER — BUPIVACAINE LIPOSOME 1.3 % IJ SUSP
20.0000 mL | INTRAMUSCULAR | Status: AC
  Administered 2015-10-09: 20 mL
  Filled 2015-10-09: qty 20

## 2015-10-09 MED ORDER — ONDANSETRON HCL 4 MG/2ML IJ SOLN: 4.0000 mg | Freq: Four times a day (QID) | INTRAMUSCULAR | Status: DC | PRN

## 2015-10-09 MED ORDER — ACETAMINOPHEN 325 MG PO TABS
650.0000 mg | ORAL_TABLET | Freq: Four times a day (QID) | ORAL | Status: DC | PRN
  Filled 2015-10-09: qty 2

## 2015-10-09 MED ORDER — ALUM & MAG HYDROXIDE-SIMETH 200-200-20 MG/5ML PO SUSP: 30.0000 mL | ORAL | Status: DC | PRN

## 2015-10-09 MED ORDER — METHOCARBAMOL 1000 MG/10ML IJ SOLN: 500.0000 mg | Freq: Four times a day (QID) | INTRAVENOUS | Status: DC | PRN

## 2015-10-09 MED ORDER — SODIUM CHLORIDE 0.9 % IV SOLN
INTRAVENOUS | Status: DC
  Administered 2015-10-09: 14:00:00 via INTRAVENOUS

## 2015-10-09 MED ORDER — MIDAZOLAM HCL 2 MG/2ML IJ SOLN
INTRAMUSCULAR | Status: AC
  Filled 2015-10-09: qty 2

## 2015-10-09 MED ORDER — PROPOFOL 500 MG/50ML IV EMUL
INTRAVENOUS | Status: DC | PRN
  Administered 2015-10-09: 75 ug/kg/min via INTRAVENOUS

## 2015-10-09 MED ORDER — PROPOFOL 10 MG/ML IV BOLUS
INTRAVENOUS | Status: AC
  Filled 2015-10-09: qty 20

## 2015-10-09 MED ORDER — LOSARTAN POTASSIUM 50 MG PO TABS
100.0000 mg | ORAL_TABLET | Freq: Every day | ORAL | Status: DC
  Administered 2015-10-09 – 2015-10-11 (×3): 100 mg via ORAL
  Filled 2015-10-09 (×3): qty 2

## 2015-10-09 MED ORDER — FENTANYL CITRATE (PF) 100 MCG/2ML IJ SOLN
INTRAMUSCULAR | Status: DC | PRN
  Administered 2015-10-09 (×4): 50 ug via INTRAVENOUS

## 2015-10-09 MED ORDER — TRANEXAMIC ACID 1000 MG/10ML IV SOLN
2000.0000 mg | Freq: Once | INTRAVENOUS | Status: AC
  Administered 2015-10-09: 2000 mg via TOPICAL
  Filled 2015-10-09: qty 20

## 2015-10-09 MED ORDER — BISACODYL 5 MG PO TBEC: 5.0000 mg | DELAYED_RELEASE_TABLET | Freq: Every day | ORAL | Status: DC | PRN

## 2015-10-09 SURGICAL SUPPLY — 64 items
BANDAGE ESMARK 6X9 LF (GAUZE/BANDAGES/DRESSINGS) ×1 IMPLANT
BENZOIN TINCTURE PRP APPL 2/3 (GAUZE/BANDAGES/DRESSINGS) ×2 IMPLANT
BLADE SAGITTAL 25.0X1.19X90 (BLADE) ×2 IMPLANT
BLADE SAW SAG 90X13X1.27 (BLADE) ×2 IMPLANT
BNDG ESMARK 6X9 LF (GAUZE/BANDAGES/DRESSINGS) ×2
BOWL SMART MIX CTS (DISPOSABLE) ×2 IMPLANT
CAP KNEE TOTAL 3 SIGMA ×2 IMPLANT
CEMENT HV SMART SET (Cement) ×4 IMPLANT
COVER SURGICAL LIGHT HANDLE (MISCELLANEOUS) ×2 IMPLANT
CUFF TOURNIQUET SINGLE 34IN LL (TOURNIQUET CUFF) ×2 IMPLANT
CUFF TOURNIQUET SINGLE 44IN (TOURNIQUET CUFF) IMPLANT
DRAPE EXTREMITY T 121X128X90 (DRAPE) ×2 IMPLANT
DRAPE IMP U-DRAPE 54X76 (DRAPES) ×2 IMPLANT
DRAPE U-SHAPE 47X51 STRL (DRAPES) ×2 IMPLANT
DRSG AQUACEL AG ADV 3.5X10 (GAUZE/BANDAGES/DRESSINGS) ×2 IMPLANT
DRSG MEPILEX BORDER 4X12 (GAUZE/BANDAGES/DRESSINGS) IMPLANT
DRSG PAD ABDOMINAL 8X10 ST (GAUZE/BANDAGES/DRESSINGS) ×2 IMPLANT
DURAPREP 26ML APPLICATOR (WOUND CARE) ×2 IMPLANT
ELECT REM PT RETURN 9FT ADLT (ELECTROSURGICAL) ×2
ELECTRODE REM PT RTRN 9FT ADLT (ELECTROSURGICAL) ×1 IMPLANT
EVACUATOR 1/8 PVC DRAIN (DRAIN) ×2 IMPLANT
FACESHIELD WRAPAROUND (MASK) IMPLANT
GAUZE SPONGE 4X4 12PLY STRL (GAUZE/BANDAGES/DRESSINGS) IMPLANT
GLOVE BIOGEL PI IND STRL 7.5 (GLOVE) ×1 IMPLANT
GLOVE BIOGEL PI IND STRL 8 (GLOVE) ×2 IMPLANT
GLOVE BIOGEL PI INDICATOR 7.5 (GLOVE) ×1
GLOVE BIOGEL PI INDICATOR 8 (GLOVE) ×2
GLOVE ECLIPSE 7.5 STRL STRAW (GLOVE) ×4 IMPLANT
GOWN STRL REUS W/ TWL LRG LVL3 (GOWN DISPOSABLE) ×2 IMPLANT
GOWN STRL REUS W/ TWL XL LVL3 (GOWN DISPOSABLE) ×2 IMPLANT
GOWN STRL REUS W/TWL LRG LVL3 (GOWN DISPOSABLE) ×2
GOWN STRL REUS W/TWL XL LVL3 (GOWN DISPOSABLE) ×2
HANDPIECE INTERPULSE COAX TIP (DISPOSABLE) ×1
HOOD PEEL AWAY FACE SHEILD DIS (HOOD) ×4 IMPLANT
IMMOBILIZER KNEE 20 (SOFTGOODS) ×2 IMPLANT
IMMOBILIZER KNEE 22 UNIV (SOFTGOODS) IMPLANT
KIT BASIN OR (CUSTOM PROCEDURE TRAY) ×2 IMPLANT
KIT ROOM TURNOVER OR (KITS) ×2 IMPLANT
MANIFOLD NEPTUNE II (INSTRUMENTS) ×2 IMPLANT
NEEDLE SPNL 22GX3.5 QUINCKE BK (NEEDLE) ×2 IMPLANT
NS IRRIG 1000ML POUR BTL (IV SOLUTION) ×2 IMPLANT
PACK TOTAL JOINT (CUSTOM PROCEDURE TRAY) ×2 IMPLANT
PACK UNIVERSAL I (CUSTOM PROCEDURE TRAY) ×2 IMPLANT
PAD ARMBOARD 7.5X6 YLW CONV (MISCELLANEOUS) ×4 IMPLANT
PAD CAST 4YDX4 CTTN HI CHSV (CAST SUPPLIES) ×1 IMPLANT
PADDING CAST COTTON 4X4 STRL (CAST SUPPLIES) ×1
SET HNDPC FAN SPRY TIP SCT (DISPOSABLE) ×1 IMPLANT
SPONGE GAUZE 4X4 12PLY STER LF (GAUZE/BANDAGES/DRESSINGS) ×2 IMPLANT
SPONGE LAP 18X18 X RAY DECT (DISPOSABLE) ×2 IMPLANT
STAPLER VISISTAT 35W (STAPLE) IMPLANT
STRIP CLOSURE SKIN 1/2X4 (GAUZE/BANDAGES/DRESSINGS) ×2 IMPLANT
SUCTION FRAZIER HANDLE 10FR (MISCELLANEOUS) ×1
SUCTION TUBE FRAZIER 10FR DISP (MISCELLANEOUS) ×1 IMPLANT
SUT MNCRL AB 3-0 PS2 18 (SUTURE) ×2 IMPLANT
SUT VIC AB 0 CTB1 27 (SUTURE) ×4 IMPLANT
SUT VIC AB 1 CT1 27 (SUTURE) ×2
SUT VIC AB 1 CT1 27XBRD ANBCTR (SUTURE) ×2 IMPLANT
SUT VIC AB 2-0 CTB1 (SUTURE) ×4 IMPLANT
SYR 50ML LL SCALE MARK (SYRINGE) ×2 IMPLANT
TOWEL OR 17X24 6PK STRL BLUE (TOWEL DISPOSABLE) ×2 IMPLANT
TOWEL OR 17X26 10 PK STRL BLUE (TOWEL DISPOSABLE) ×2 IMPLANT
TRAY CATH 16FR W/PLASTIC CATH (SET/KITS/TRAYS/PACK) ×2 IMPLANT
TRAY FOLEY CATH 16FRSI W/METER (SET/KITS/TRAYS/PACK) IMPLANT
WRAP KNEE MAXI GEL POST OP (GAUZE/BANDAGES/DRESSINGS) ×2 IMPLANT

## 2015-10-09 NOTE — Discharge Instructions (Signed)

## 2015-10-09 NOTE — Progress Notes (Signed)
Orthopedic Tech Progress Note Patient Details:  Aaron [name] 1945-08-24 [id number]  CPM Left Knee CPM Left Knee: On Left Knee Flexion (Degrees): 90 Left Knee Extension (Degrees): 0 Additional Comments: trapeze bar patient helper Viewed order from doctor's order list  Hildred Priest 10/09/2015, 10:43 AM

## 2015-10-09 NOTE — Transfer of Care (Signed)
Immediate Anesthesia Transfer of Care Note  Patient: Aaron [name]  Procedure(s) Performed: Procedure(s): TOTAL KNEE ARTHROPLASTY (Left)  Patient Location: PACU  Anesthesia Type:MAC and Spinal  Level of Consciousness: responds to stimulation  Airway & Oxygen Therapy: Patient Spontanous Breathing and Patient connected to nasal cannula oxygen  Post-op Assessment: Report given to RN and Post -op Vital signs reviewed and stable  Post vital signs: Reviewed and stable  Last Vitals:  Filed Vitals:   10/09/15 0636 10/09/15 0953  BP: 158/66 126/65  Pulse: 73 80  Temp: 36.7 C 36.4 C  Resp: 18 15    Complications: No apparent anesthesia complications

## 2015-10-09 NOTE — Evaluation (Signed)
Physical Therapy Evaluation Patient Details Name: Aaron [name] MRN: [id number] DOB: 1946/04/17 Today's Date: 10/09/2015   History of Present Illness  Patient is a 70 yo male admitted 10/09/15 now s/p Lt TKA.   PMH:  CAD, HTN, HLD, OSA on CPAP, OA, anxiety, depression, artificial Rt eye (1957)  Clinical Impression  Patient presents with problems listed below.  Will benefit from acute PT to maximize functional mobility prior to discharge.  Recommend HHPT at discharge for continued therapy.    Follow Up Recommendations Home health PT;Supervision/Assistance - 24 hour    Equipment Recommendations  3in1 (PT)    Recommendations for Other Services       Precautions / Restrictions Precautions Precautions: Knee;Fall Precaution Comments: Reviewed knee precautions Required Braces or Orthoses: Knee Immobilizer - Left Knee Immobilizer - Left: On when out of bed or walking Restrictions Weight Bearing Restrictions: Yes LLE Weight Bearing: Weight bearing as tolerated      Mobility  Bed Mobility Overal bed mobility: Needs Assistance Bed Mobility: Supine to Sit     Supine to sit: Mod assist;HOB elevated     General bed mobility comments: Instructed patient on donning KI on LLE.  Verbal cues for technique.  Assist to move LLE off of bed, and to raise trunk to sitting position.  Good sitting balance once upright.  Patient cool and clammy, feeling slightly lightheaded.  Lightheadedness improved with time.  Provided cool cloth.  Transfers Overall transfer level: Needs assistance Equipment used: Rolling walker (2 wheeled) Transfers: Sit to/from Stand Sit to Stand: Min assist         General transfer comment: Verbal cues for hand placement and technique.  Assist to rise to standing.  Once upright, patient reports "feeling better".    Ambulation/Gait Ambulation/Gait assistance: Min assist Ambulation Distance (Feet): 5 Feet Assistive device: Rolling walker (2 wheeled) Gait  Pattern/deviations: Step-to pattern;Decreased stance time - left;Decreased step length - right;Decreased step length - left;Decreased stride length;Decreased weight shift to left;Antalgic Gait velocity: decreased Gait velocity interpretation: Below normal speed for age/gender General Gait Details: Verbal cues for safe use of RW and gait sequence.  Patient able to ambulate 5' with RW to chair.  Stairs            Wheelchair Mobility    Modified Rankin (Stroke Patients Only)       Balance                                             Pertinent Vitals/Pain Pain Assessment: 0-10 Pain Score: 6  Pain Location: Lt knee Pain Descriptors / Indicators: Aching;Sore Pain Intervention(s): Monitored during session;Repositioned    Home Living Family/patient expects to be discharged to:: Private residence Living Arrangements: Spouse/significant other Available Help at Discharge: Family;Available 24 hours/day (Sig other and 2 sons) Type of Home: House Home Access: Stairs to enter Entrance Stairs-Rails: Right Entrance Stairs-Number of Steps: 4 Home Layout: One level Home Equipment: Walker - 2 wheels;Cane - single point;Grab bars - toilet      Prior Function Level of Independence: Independent               Hand Dominance        Extremity/Trunk Assessment   Upper Extremity Assessment: Overall WFL for tasks assessed (Has arthritis in hands)           Lower Extremity Assessment: LLE deficits/detail  LLE Deficits / Details: Decreased strength and ROM post-op     Communication   Communication: No difficulties  Cognition Arousal/Alertness: Awake/alert Behavior During Therapy: WFL for tasks assessed/performed Overall Cognitive Status: Within Functional Limits for tasks assessed                      General Comments      Exercises Total Joint Exercises Ankle Circles/Pumps: AROM;Both;10 reps;Seated      Assessment/Plan    PT  Assessment Patient needs continued PT services  PT Diagnosis Difficulty walking;Acute pain   PT Problem List Decreased strength;Decreased range of motion;Decreased activity tolerance;Decreased balance;Decreased mobility;Decreased knowledge of use of DME;Cardiopulmonary status limiting activity;Obesity;Pain  PT Treatment Interventions DME instruction;Gait training;Stair training;Functional mobility training;Therapeutic activities;Therapeutic exercise;Patient/family education   PT Goals (Current goals can be found in the Care Plan section) Acute Rehab PT Goals Patient Stated Goal: To get stronger PT Goal Formulation: With patient Time For Goal Achievement: 10/16/15 Potential to Achieve Goals: Good    Frequency 7X/week   Barriers to discharge        Co-evaluation               End of Session Equipment Utilized During Treatment: Gait belt;Left knee immobilizer;Oxygen Activity Tolerance: Patient tolerated treatment well Patient left: in chair;with call bell/phone within reach;with family/visitor present Nurse Communication: Mobility status         Time: [id number] PT Time Calculation (min) (ACUTE ONLY): 63 min   Charges:   PT Evaluation $PT Eval Moderate Complexity: 1 Procedure PT Treatments $Gait Training: 8-22 mins $Therapeutic Activity: 8-22 mins   PT G Codes:        Despina Pole 27-Oct-2015, 6:17 PM Carita Pian. Sanjuana Kava, [place] Pager [phone]

## 2015-10-09 NOTE — Progress Notes (Signed)
Utilization review completed.  

## 2015-10-09 NOTE — Anesthesia Postprocedure Evaluation (Signed)
Anesthesia Post Note  Patient: Aaron [name]  Procedure(s) Performed: Procedure(s) (LRB): TOTAL KNEE ARTHROPLASTY (Left)  Patient location during evaluation: PACU Anesthesia Type: Spinal and MAC Level of consciousness: awake and alert Pain management: pain level controlled Vital Signs Assessment: post-procedure vital signs reviewed and stable Respiratory status: spontaneous breathing and respiratory function stable Cardiovascular status: blood pressure returned to baseline and stable Postop Assessment: spinal receding Anesthetic complications: no    Last Vitals:  Filed Vitals:   10/09/15 1124 10/09/15 1150  BP: 153/76 152/82  Pulse: 66 62  Temp:    Resp: 9 13    Last Pain:  Filed Vitals:   10/09/15 1204  PainSc: 7                  Tanazia Achee,W. EDMOND

## 2015-10-09 NOTE — Progress Notes (Signed)
Orthopedic Tech Progress Note Patient Details:  Aaron [name] 01/28/1946 [id number]  Patient ID: Aaron [name], male   DOB: 09-22-1945, 70 y.o.   MRN: [id number] Pt already in cpm   Karolee Stamps 10/09/2015, 8:35 PM

## 2015-10-09 NOTE — Progress Notes (Signed)
Placed patient on CPAP for the night.  Patient is tolerating well at this time. 

## 2015-10-09 NOTE — Brief Op Note (Signed)
10/09/2015  9:26 AM  PATIENT:  Aaron [name]  70 y.o. male  PRE-OPERATIVE DIAGNOSIS:  Osteoarthritis of both knees  POST-OPERATIVE DIAGNOSIS:  Osteoarthritis of both knees  PROCEDURE:  Procedure(s): TOTAL KNEE ARTHROPLASTY (Left)  SURGEON:  Surgeon(s) and Role:    * Dorna Leitz, MD - Primary  PHYSICIAN ASSISTANT:   ASSISTANTS: bethune   ANESTHESIA:   spinal  EBL:  Total I/O In: 1000 [I.V.:1000] Out: -   BLOOD ADMINISTERED:none  DRAINS: (1 med) Hemovact drain(s) in the l knee with  Suction Open   LOCAL MEDICATIONS USED:  OTHER experel  SPECIMEN:  No Specimen  DISPOSITION OF SPECIMEN:  N/A  COUNTS:  YES  TOURNIQUET:   Total Tourniquet Time Documented: Thigh (Left) - 64 minutes Total: Thigh (Left) - 64 minutes   DICTATION: .Other Dictation: Dictation Number [id number]  PLAN OF CARE: Admit to inpatient   PATIENT DISPOSITION:  PACU - hemodynamically stable.   Delay start of Pharmacological VTE agent (>24hrs) due to surgical blood loss or risk of bleeding: no

## 2015-10-09 NOTE — Op Note (Signed)
NAME:  Aaron [name], Aaron [name] NO.:  [id number]  MEDICAL RECORD NO.:  [id number]  LOCATION:  [facility]                         FACILITY:  [facility]  PHYSICIAN:  Alta Corning, M.D.   DATE OF BIRTH:  1945-10-17  DATE OF PROCEDURE:  10/09/2015 DATE OF DISCHARGE:                              OPERATIVE REPORT   PREOPERATIVE DIAGNOSIS:  End-stage degenerative joint disease, bilateral knees.  POSTOPERATIVE DIAGNOSIS:  End-stage degenerative joint disease, bilateral knees.  PRINCIPAL PROCEDURE:  Left total knee replacement with a Sigma system, size 5 femur, size 5 tibia, 10 mm bridging bearing, and a 41 mm all polyethylene patella.  SURGEON:  Alta Corning, M.D.  ASSISTANT:  Gary Fleet, PA.  ANESTHESIA:  Spinal.  BRIEF HISTORY:  Aaron [name] is a 70 year old male with a long history of significant complaints of bilateral knee pain, left was greater than right.  We had a long talk about treatment options.  We felt that after failure of conservative care including injection therapy, activity modification, and viscous supplementation; the patient was ready for total knee replacement.  He was having night pain and light activity pain and had x-ray showing bone-on-bone change.  He was taken to the operating room for this procedure.  DESCRIPTION OF PROCEDURE:  The patient was taken to the operating room. After adequate anesthesia was obtained with spinal anesthetic, the patient was placed supine on the operating table.  Left leg was prepped and draped in usual sterile fashion.  Following this, the leg was exsanguinated.  Blood pressure tourniquet inflated to 350 mmHg. Following this, a midline incision was made in the subcutaneous tissue, it was dissected down to the level of extensor mechanism.  Medial parapatellar arthrotomy was undertaken and following this, retropatellar fat pad was removed, synovium from the anterior aspect of the femur, and anterior and posterior cruciates,  medial and lateral meniscus were removed, and following this, attention was turned to the knee where an intramedullary pilot hole was drilled in the femur.  The 5-degree valgus alignment guide was used, 11 mm of distal bone was resected.  Following this, attention was turned towards sizing the femur, it was sized to a 5.  Anterior and posterior cuts were made, chamfers and box.  Attention was then turned towards the tibia, which was cut perpendicular to its long axis with an extramedullary alignment guide.  It was sized to a 5 and it was then drilled and keeled, and trial components were put in place with a 10-mm bridging bearing.  Excellent range of motion and stability were achieved.  Attention was turned to the patella, cut down to a level of 13 mm and a 41 paddle was chosen.  Lugs were drilled and the trial patella was placed.  Excellent range of motion and stability were achieved at this point.  At this point, osteotome was used to remove osteophytes from the back of the knee and attention was then turned towards washing and drying the knee, which was done.  The components were then cemented into place with a size 5 tibia, size 5 femur, 10 mm bridging bearing trial was placed and the cement was allowed to completely harden, 41  all poly patella was placed and held with a clamp.  All excess bone cement was removed.  Once the cement was completely hardened, we let the tourniquet down.  We used Exparel throughout the knee for postop anesthetic control.  We used tranexamic acid topically for 5 minutes because of the patient's history of pulmonary embolism.  Tourniquet was let down.  All bleeding was controlled with electrocautery.  We then went towards using #1 Vicryl for closure of the medial patellar arthrotomy after the final poly was placed and a medium Hemovac drain.  The medial parapatellar arthrotomy was closed with 1-Vicryl running, skin with 0 and 2-0 Vicryl, and 3-0 Monocryl  subcuticular.  Benzoin and Steri-Strips were applied.  Sterile compressive dressing was applied.  The patient was taken to the recovery room, was noted to be in a satisfactory condition.  Estimated blood loss for the procedure was minimal.     Alta Corning, M.D.     Corliss Skains  D:  10/09/2015  T:  10/09/2015  Job:  [id number]

## 2015-10-09 NOTE — Anesthesia Procedure Notes (Addendum)
Procedure Name: MAC Date/Time: 10/09/2015 7:45 AM Performed by: Manuela Schwartz B Pre-anesthesia Checklist: Patient identified, Emergency Drugs available, Suction available, Patient being monitored and Timeout performed Patient Re-evaluated:Patient Re-evaluated prior to inductionOxygen Delivery Method: Simple face mask   Spinal Patient location during procedure: OR Start time: 10/09/2015 7:35 AM End time: 10/09/2015 7:39 AM Staffing Anesthesiologist: Roderic Palau Performed by: anesthesiologist  Preanesthetic Checklist Completed: patient identified, surgical consent, pre-op evaluation, timeout performed, IV checked, risks and benefits discussed and monitors and equipment checked Spinal Block Patient position: sitting Prep: DuraPrep Patient monitoring: cardiac monitor, continuous pulse ox and blood pressure Approach: midline Location: L3-4 Injection technique: single-shot Needle Needle type: Pencan  Needle gauge: 24 G Needle length: 9 cm Assessment Sensory level: T6 Additional Notes Functioning IV was confirmed and monitors were applied. Sterile prep and drape, including hand hygiene and sterile gloves were used. The patient was positioned and the spine was prepped. The skin was anesthetized with lidocaine.  Free flow of clear CSF was obtained prior to injecting local anesthetic into the CSF.  The spinal needle aspirated freely following injection.  The needle was carefully withdrawn.  The patient tolerated the procedure well.

## 2015-10-10 LAB — CBC
HEMATOCRIT: 28.9 % — AB (ref 39.0–52.0)
HEMOGLOBIN: 10.2 g/dL — AB (ref 13.0–17.0)
MCH: 29.7 pg (ref 26.0–34.0)
MCHC: 35.3 g/dL (ref 30.0–36.0)
MCV: 84.3 fL (ref 78.0–100.0)
Platelets: 252 10*3/uL (ref 150–400)
RBC: 3.43 MIL/uL — AB (ref 4.22–5.81)
RDW: 13 % (ref 11.5–15.5)
WBC: 9 10*3/uL (ref 4.0–10.5)

## 2015-10-10 LAB — BASIC METABOLIC PANEL
ANION GAP: 13 (ref 5–15)
BUN: 32 mg/dL — ABNORMAL HIGH (ref 6–20)
CALCIUM: 8.5 mg/dL — AB (ref 8.9–10.3)
CO2: 21 mmol/L — AB (ref 22–32)
Chloride: 98 mmol/L — ABNORMAL LOW (ref 101–111)
Creatinine, Ser: 1.38 mg/dL — ABNORMAL HIGH (ref 0.61–1.24)
GFR calc non Af Amer: 51 mL/min — ABNORMAL LOW (ref >60)
GFR, EST AFRICAN AMERICAN: 59 mL/min — AB (ref >60)
GLUCOSE: 178 mg/dL — AB (ref 65–99)
POTASSIUM: 4 mmol/L (ref 3.5–5.1)
Sodium: 132 mmol/L — ABNORMAL LOW (ref 135–145)

## 2015-10-10 LAB — HEMOGLOBIN A1C
Hgb A1c MFr Bld: 6.3 % — ABNORMAL HIGH (ref 4.8–5.6)
Mean Plasma Glucose: 134 mg/dL

## 2015-10-10 NOTE — Progress Notes (Signed)
Physical Therapy Treatment Patient Details Name: Aaron [name] MRN: [id number] DOB: 11-Mar-1946 Today's Date: 10/10/2015    History of Present Illness Patient is a 70 yo male admitted 10/09/15 now s/p Lt TKA.   PMH:  CAD, HTN, HLD, OSA on CPAP, OA, anxiety, depression, artificial Rt eye (1957)    PT Comments    Patient with fatigue and "seeing spots".  Vitals taken and WNL. (see below)  Patient requested no gait at this time.  Did agree to perform exercise program.  Follow Up Recommendations  Home health PT;Supervision/Assistance - 24 hour     Equipment Recommendations  3in1 (PT)    Recommendations for Other Services       Precautions / Restrictions Precautions Precautions: Knee;Fall Precaution Booklet Issued: Yes (comment) Precaution Comments: Reviewed knee precautions Required Braces or Orthoses: Knee Immobilizer - Left Knee Immobilizer - Left: On when out of bed or walking Restrictions Weight Bearing Restrictions: Yes LLE Weight Bearing: Weight bearing as tolerated    Mobility  Bed Mobility Overal bed mobility: Needs Assistance Bed Mobility: Sit to Supine       Sit to supine: Min assist   General bed mobility comments: Verbal cues for technique.  Assist to bring LLE onto bed.  Patient able to use trapeze to move to [facility].  Transfers                 General transfer comment: NT due to patient "seeing spots"  Ambulation/Gait                 Stairs            Wheelchair Mobility    Modified Rankin (Stroke Patients Only)       Balance                                    Cognition Arousal/Alertness: Awake/alert Behavior During Therapy: WFL for tasks assessed/performed Overall Cognitive Status: Within Functional Limits for tasks assessed                      Exercises Total Joint Exercises Ankle Circles/Pumps: AROM;Both;10 reps;Supine Quad Sets: AROM;Left;10 reps;Supine Towel Squeeze: AROM;Both;10  reps;Supine Short Arc Quad: AROM;Left;10 reps;Supine Heel Slides: AROM;Left;10 reps;Supine Hip ABduction/ADduction: AROM;Left;10 reps;Supine Goniometric ROM: -10 to 70    General Comments        Pertinent Vitals/Pain Pain Assessment: 0-10 Pain Score: 6  (with exercises) Pain Location: Lt knee Pain Descriptors / Indicators: Aching;Sore Pain Intervention(s): Monitored during session;Repositioned   In sitting EOB: BP 137/58 HR 79 O2 sats  96%     Home Living                      Prior Function            PT Goals (current goals can now be found in the care plan section) Progress towards PT goals: Progressing toward goals    Frequency  7X/week    PT Plan Current plan remains appropriate    Co-evaluation             End of Session   Activity Tolerance: Patient limited by fatigue Patient left: in bed;with call bell/phone within reach;with family/visitor present     Time: [id number] PT Time Calculation (min) (ACUTE ONLY): 31 min  Charges:  $Therapeutic Exercise: 8-22 mins $Therapeutic Activity: 8-22 mins  G Codes:      Despina Pole 10/10/2015, 11:55 AM Carita Pian. Sanjuana Kava, [place] Pager [phone]

## 2015-10-10 NOTE — Progress Notes (Signed)
PATIENT ID: Aaron [name]  MRN: [id number]  DOB/AGE:  03-11-1946 / 70 y.o.  1 Day Post-Op Procedure(s) (LRB): TOTAL KNEE ARTHROPLASTY (Left)    PROGRESS NOTE Subjective: Patient is alert, oriented, no Nausea, no Vomiting, yes passing gas. Taking PO well with pt up and eating in the room. Denies SOB, Chest or Calf Pain. Using Incentive Spirometer, PAS in place. Ambulate WBAT with pt walking 5 ft with therapy, CPM 0-90 Patient reports pain as 7/10 .    Objective: Vital signs in last 24 hours: Filed Vitals:   10/09/15 2054 10/09/15 2212 10/10/15 0035 10/10/15 0508  BP: 132/69  144/79 146/66  Pulse: 76 80 77 83  Temp: 97.9 F (36.6 C)  98 F (36.7 C) 97.8 F (36.6 C)  TempSrc: Oral  Oral Oral  Resp: 18 18 18 18   Height:      Weight:      SpO2: 96% 96% 98% 96%      Intake/Output from previous day: I/O last 3 completed shifts: In: 1960 [P.O.:760; I.V.:1200] Out: 1200 [Urine:700; Drains:400; Blood:100]   Intake/Output this shift:     LABORATORY DATA:  Recent Labs  10/10/15 0514  WBC 9.0  HGB 10.2*  HCT 28.9*  PLT 252  NA 132*  K 4.0  CL 98*  CO2 21*  BUN 32*  CREATININE 1.38*  GLUCOSE 178*  CALCIUM 8.5*    Examination: Neurologically intact Neurovascular intact Sensation intact distally Intact pulses distally Dorsiflexion/Plantar flexion intact Incision: dressing C/D/I No cellulitis present Compartment soft}  Assessment:   1 Day Post-Op Procedure(s) (LRB): TOTAL KNEE ARTHROPLASTY (Left) ADDITIONAL DIAGNOSIS: Expected Acute Blood Loss Anemia, Sleep Apnea and CAD, Hypertension, depression, Past history of pulmonary emobolism.  Plan: PT/OT WBAT, CPM 5/hrs day until ROM 0-90 degrees, then D/C CPM DVT Prophylaxis:  SCDx72hrs, ASA 325 mg BID x 2 weeks DISCHARGE PLAN: Home DISCHARGE NEEDS: HHPT, HHRN, CPM, Walker and 3-in-1 comode seat     Curtiss Mahmood R 10/10/2015, 8:30 AM

## 2015-10-10 NOTE — Care Management Note (Addendum)
Case Management Note  Patient Details  Name: Aaron [name] MRN: [id number] Date of Birth: 1945-12-15  Subjective/Objective: 70 yo M s/p Lt TKA                 Action/Plan: PT is recommending HHPT and a 3-in-1 [facility]   Expected Discharge Date:   10/11/15               Expected Discharge Plan:  [place]  In-House Referral:     Discharge planning Services  CM Consult  Post Acute Care Choice:  Home Health Choice offered to:     DME Arranged:    DME Agency:     HH Arranged:  PT [place]:  [place]  Status of Service:  Completed, signed off  Medicare Important Message Given:    Date Medicare IM Given:    Medicare IM give by:    Date Additional Medicare IM Given:    Additional Medicare Important Message give by:     If discussed at [place] of Stay Meetings, dates discussed:    Additional Comments: met with pt and wife. Discharge plan is to return home with the support of his wife. They declined the 3-in-1 BSC. He stated that the BR is near his bed. Referral made to [facility] prior to surgery. They agreed with [name]. Wife reports that she used them when she had her knee surgery and they were wonderful. Contacted [name] at [facility] to confirm referral.  Norina Buzzard, RN 10/10/2015, 11:12 AM

## 2015-10-10 NOTE — Progress Notes (Signed)
Physical Therapy Treatment Patient Details Name: KAEO MCNAIR MRN: [id number] DOB: [birth year] Today's Date: 10/10/2015    History of Present Illness Patient is a 70 yo male admitted 10/09/15 now s/p Lt TKA.   PMH:  CAD, HTN, HLD, OSA on CPAP, OA, anxiety, depression, artificial Rt eye (1957)    PT Comments    Patient able to ambulate 12' with RW and min guard assist.  Will need to address stairs in am.  Follow Up Recommendations  Home health PT;Supervision/Assistance - 24 hour     Equipment Recommendations  3in1 (PT)    Recommendations for Other Services       Precautions / Restrictions Precautions Precautions: Knee;Fall Precaution Comments: Reviewed precautions Required Braces or Orthoses: Knee Immobilizer - Left Knee Immobilizer - Left: On when out of bed or walking Restrictions Weight Bearing Restrictions: Yes LLE Weight Bearing: Weight bearing as tolerated    Mobility  Bed Mobility Overal bed mobility: Needs Assistance Bed Mobility: Supine to Sit     Supine to sit: Min assist     General bed mobility comments: Assist to bring LLE off of bed.  Patient able to use UE's to raise trunk to sitting.  Transfers Overall transfer level: Needs assistance Equipment used: Rolling walker (2 wheeled) Transfers: Sit to/from Stand Sit to Stand: Min assist         General transfer comment: Assist to steady during transfer to standing.  Ambulation/Gait Ambulation/Gait assistance: Min guard Ambulation Distance (Feet): 90 Feet Assistive device: Rolling walker (2 wheeled) Gait Pattern/deviations: Step-to pattern;Decreased stance time - left;Decreased step length - right;Decreased step length - left;Decreased stride length;Decreased weight shift to left;Antalgic Gait velocity: decreased Gait velocity interpretation: Below normal speed for age/gender General Gait Details: Verbal cues to stand upright and look forward during gait.  Improved activity tolerance this  pm.   Stairs            Wheelchair Mobility    Modified Rankin (Stroke Patients Only)       Balance                                    Cognition Arousal/Alertness: Awake/alert Behavior During Therapy: WFL for tasks assessed/performed Overall Cognitive Status: Within Functional Limits for tasks assessed                      Exercises Total Joint Exercises Ankle Circles/Pumps: AROM;Both;10 reps;Seated Quad Sets: AROM;Left;10 reps;Seated Heel Slides: AROM;Left;5 reps;Seated Long Arc Quad: AROM;Left;10 reps;Seated Knee Flexion: AROM;Left;5 reps;Seated Goniometric ROM: 75* flexion    General Comments        Pertinent Vitals/Pain Pain Assessment: 0-10 Pain Score: 3  Pain Location: Lt knee Pain Descriptors / Indicators: Sore Pain Intervention(s): Monitored during session;Repositioned;RN gave pain meds during session    Home Living                      Prior Function            PT Goals (current goals can now be found in the care plan section) Progress towards PT goals: Progressing toward goals    Frequency  7X/week    PT Plan Current plan remains appropriate    Co-evaluation             End of Session Equipment Utilized During Treatment: Gait belt;Left knee immobilizer Activity Tolerance: Patient tolerated treatment well Patient left: in  chair;with call bell/phone within reach;with family/visitor present     Time: 1611-1650 PT Time Calculation (min) (ACUTE ONLY): 39 min  Charges:  $Gait Training: 8-22 mins $Therapeutic Exercise: 8-22 mins $Therapeutic Activity: 8-22 mins                    G Codes:      Despina Pole 11/07/2015, 4:59 PM Carita Pian. Sanjuana Kava, [place] Pager [phone]

## 2015-10-11 LAB — CBC
HCT: 29.4 % — ABNORMAL LOW (ref 39.0–52.0)
Hemoglobin: 10.3 g/dL — ABNORMAL LOW (ref 13.0–17.0)
MCH: 29.5 pg (ref 26.0–34.0)
MCHC: 35 g/dL (ref 30.0–36.0)
MCV: 84.2 fL (ref 78.0–100.0)
PLATELETS: 225 10*3/uL (ref 150–400)
RBC: 3.49 MIL/uL — ABNORMAL LOW (ref 4.22–5.81)
RDW: 13.2 % (ref 11.5–15.5)
WBC: 11.3 10*3/uL — AB (ref 4.0–10.5)

## 2015-10-11 NOTE — Progress Notes (Signed)
Physical Therapy Treatment Patient Details Name: Aaron [name] MRN: [id number] DOB: 11/27/1945 Today's Date: 10/11/2015    History of Present Illness Patient is a 69 yo male admitted 10/09/15 now s/p Lt TKA.   PMH:  CAD, HTN, HLD, OSA on CPAP, OA, anxiety, depression, artificial Rt eye (1957)    PT Comments    Patient able to ambulate 160' with RW and negotiate stairs with min assist.  Patient has met all acute PT goals.  Ready for d/c from PT perspective.  Patient to have f/u HHPT.  Follow Up Recommendations  Home health PT;Supervision/Assistance - 24 hour     Equipment Recommendations  3in1 (PT)    Recommendations for Other Services       Precautions / Restrictions Precautions Precautions: Knee Required Braces or Orthoses: Knee Immobilizer - Left Knee Immobilizer - Left: On when out of bed or walking Restrictions Weight Bearing Restrictions: Yes LLE Weight Bearing: Weight bearing as tolerated    Mobility  Bed Mobility Overal bed mobility: Needs Assistance Bed Mobility: Supine to Sit;Sit to Supine     Supine to sit: Supervision Sit to supine: Supervision   General bed mobility comments: Assist to don KI on LLE.  Bed flat with no rail.  Patient able to move supine <> sit with supervision only.  Transfers Overall transfer level: Needs assistance Equipment used: Rolling walker (2 wheeled) Transfers: Sit to/from Stand Sit to Stand: Supervision         General transfer comment: Patient using correct hand placement.  Assist for safety only.  Ambulation/Gait Ambulation/Gait assistance: Supervision Ambulation Distance (Feet): 160 Feet Assistive device: Rolling walker (2 wheeled) Gait Pattern/deviations: Step-through pattern;Decreased stance time - left;Decreased step length - left;Decreased stride length;Antalgic Gait velocity: decreased Gait velocity interpretation: Below normal speed for age/gender General Gait Details: Verbal cues to stand upright.  Supervision  for safety.   Stairs Stairs: Yes Stairs assistance: Min assist Stair Management: One rail Right;Step to pattern;Forwards Number of Stairs: 3 General stair comments: Instructed patient to negotiate stairs forward with 1 rail and step-to pattern.  Patient able to complete with min assist to steady.  Wheelchair Mobility    Modified Rankin (Stroke Patients Only)       Balance                                    Cognition Arousal/Alertness: Awake/alert Behavior During Therapy: WFL for tasks assessed/performed Overall Cognitive Status: Within Functional Limits for tasks assessed                      Exercises Total Joint Exercises Quad Sets: AROM;Left;10 reps;Supine Knee Flexion: AROM;Left;5 reps;Seated Goniometric ROM: -10 to 90    General Comments        Pertinent Vitals/Pain Pain Assessment: 0-10 Pain Score: 3  Pain Location: Lt knee Pain Descriptors / Indicators: Sore Pain Intervention(s): Monitored during session;Repositioned    Home Living                      Prior Function            PT Goals (current goals can now be found in the care plan section) Progress towards PT goals: Goals met/education completed, patient discharged from PT    Frequency  7X/week    PT Plan Current plan remains appropriate    Co-evaluation               End of Session Equipment Utilized During Treatment: Gait belt;Left knee immobilizer Activity Tolerance: Patient tolerated treatment well Patient left: in bed;with call bell/phone within reach;with family/visitor present     Time: 1102-1153  With MD to see patient 11:26-11:40 PT Time Calculation (min) (ACUTE ONLY): 51 min - 14 min = 37 min total  Charges:  $Gait Training: 23-37 mins                    G Codes:      Davis, Susan H 10/11/2015, 12:42 PM Susan H. Davis, PT, MBA Acute Rehab Services Pager [phone]   

## 2015-10-11 NOTE — Progress Notes (Signed)
Orthopedic Tech Progress Note Patient Details:  Aaron [name] Jun 07, 1946 [id number]  Patient ID: Aaron [name], male   DOB: 26-Mar-1946, 70 y.o.   MRN: [id number] Applied cpm 0-70  Karolee Stamps 10/11/2015, 5:34 AM

## 2015-10-11 NOTE — Progress Notes (Signed)
PATIENT ID: Aaron [name]  MRN: [id number]  DOB/AGE:  Jun 27, 1946 / 70 y.o.  2 Days Post-Op Procedure(s) (LRB): TOTAL KNEE ARTHROPLASTY (Left)    PROGRESS NOTE Subjective: Patient is alert, oriented, no Nausea, no Vomiting, yes passing gas. Taking PO well. Denies SOB, Chest or Calf Pain. Using Incentive Spirometer, PAS in place. Ambulate WBAT with pt passing therapy goals, CPM 0-70 Patient reports pain as 3/10 .    Objective: Vital signs in last 24 hours: Filed Vitals:   10/10/15 0508 10/10/15 1423 10/10/15 2153 10/11/15 0538  BP: 146/66 146/62 143/69 169/67  Pulse: 83 87 77 69  Temp: 97.8 F (36.6 C)  98.4 F (36.9 C) 98.1 F (36.7 C)  TempSrc: Oral  Oral Oral  Resp: 18 18 18 18   Height:      Weight:      SpO2: 96% 98% 100% 100%      Intake/Output from previous day: I/O last 3 completed shifts: In: 240 [P.O.:240] Out: 1200 [Urine:900; Drains:300]   Intake/Output this shift:     LABORATORY DATA:  Recent Labs  10/10/15 0514 10/11/15 0632  WBC 9.0 11.3*  HGB 10.2* 10.3*  HCT 28.9* 29.4*  PLT 252 225  NA 132*  --   K 4.0  --   CL 98*  --   CO2 21*  --   BUN 32*  --   CREATININE 1.38*  --   GLUCOSE 178*  --   CALCIUM 8.5*  --     Examination: Neurologically intact Neurovascular intact Sensation intact distally Intact pulses distally Dorsiflexion/Plantar flexion intact Incision: dressing C/D/I No cellulitis present Compartment soft}  Assessment:   2 Days Post-Op Procedure(s) (LRB): TOTAL KNEE ARTHROPLASTY (Left)  ADDITIONAL DIAGNOSIS: Expected Acute Blood Loss Anemia,Sleep Apnea and CAD, Hypertension, depression, Past history of pulmonary emobolism.  Plan: PT/OT WBAT, CPM 5/hrs day until ROM 0-90 degrees, then D/C CPM DVT Prophylaxis:  SCDx72hrs, Xarelto DISCHARGE PLAN: Home DISCHARGE NEEDS: HHPT, CPM, Walker and 3-in-1 comode seat     Murl Golladay R 10/11/2015, 11:44 AM

## 2015-10-11 NOTE — Discharge Summary (Signed)
Patient ID: Aaron [name] MRN: [id number] DOB/AGE: 70-Jan-1947 70 y.o.  Admit date: 10/09/2015 Discharge date: 10/11/2015  Admission Diagnoses:  Principal Problem:   Primary osteoarthritis of left knee   Discharge Diagnoses:  Same  Past Medical History  Diagnosis Date  . Coronary artery disease     DES LAD 2005  . Hypertension   . Hyperlipidemia   . Depression   . GERD (gastroesophageal reflux disease)   . Insomnia   . Sleep apnea, obstructive   . Pulmonary embolism ([place]) ~ 03/2006    bilaterally  . Osteoarthritis   . Shortness of breath 06/08/11    "w/exertion; that's why I'm here"  . Heart murmur   . Blood transfusion     "@ birth"  . Anemia     "@ birth"  . 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 PTSD (post-traumatic stress disorder)   . Seasonal allergies   . PONV (postoperative nausea and vomiting)     2006 - knee surgery  . Anxiety   . Constipation due to pain medication   . Cancer ([place])     Head - skin cancer  . Psoriasis     Surgeries: Procedure(s): TOTAL KNEE ARTHROPLASTY on 10/09/2015   Consultants:    Discharged Condition: Improved  Hospital Course: Aaron [name] is an 70 y.o. male who was admitted 10/09/2015 for operative treatment ofPrimary osteoarthritis of left knee. Patient has severe unremitting pain that affects sleep, daily activities, and work/hobbies. After pre-op clearance the patient was taken to the operating room on 10/09/2015 and underwent  Procedure(s): TOTAL KNEE ARTHROPLASTY.    Patient was given perioperative antibiotics: Anti-infectives    Start     Dose/Rate Route Frequency Ordered Stop   10/09/15 1400  clindamycin (CLEOCIN) IVPB 600 mg     600 mg 100 mL/hr over 30 Minutes Intravenous Every 6 hours 10/09/15 1231 10/09/15 2119   10/09/15 0700  clindamycin (CLEOCIN) IVPB 900 mg     900 mg 100 mL/hr over 30 Minutes Intravenous To ShortStay Surgical 10/08/15 1038 10/09/15 0744       Patient was given  sequential compression devices, early ambulation, and chemoprophylaxis to prevent DVT.  Patient benefited maximally from hospital stay and there were no complications.    Recent vital signs: Patient Vitals for the past 24 hrs:  BP Temp Temp src Pulse Resp SpO2  10/11/15 0538 (!) 169/67 mmHg 98.1 F (36.7 C) Oral 69 18 100 %  10/10/15 2153 (!) 143/69 mmHg 98.4 F (36.9 C) Oral 77 18 100 %  10/10/15 1423 (!) 146/62 mmHg - - 87 18 98 %     Recent laboratory studies:  Recent Labs  10/10/15 0514 10/11/15 0632  WBC 9.0 11.3*  HGB 10.2* 10.3*  HCT 28.9* 29.4*  PLT 252 225  NA 132*  --   K 4.0  --   CL 98*  --   CO2 21*  --   BUN 32*  --   CREATININE 1.38*  --   GLUCOSE 178*  --   CALCIUM 8.5*  --      Discharge Medications:     Medication List    STOP taking these medications        HYDROcodone-acetaminophen 5-325 MG tablet  Commonly known as:  NORCO/VICODIN     traMADol 50 MG tablet  Commonly known as:  ULTRAM      TAKE these medications  ALPRAZolam 0.5 MG tablet  Commonly known as:  XANAX  TAKE 1 TABLET BY MOUTH 3 TIMES A DAY AS NEEDED ANXIETY     amLODipine 10 MG tablet  Commonly known as:  NORVASC  TAKE 1 TABLET EVERY DAY     atorvastatin 40 MG tablet  Commonly known as:  LIPITOR  Take 1 tablet by mouth  daily     fenofibrate 160 MG tablet  Take 1 tablet (160 mg total) by mouth daily.     GENTAK 0.3 % ophthalmic ointment  Generic drug:  gentamicin  Place 1 mL into both eyes daily.     losartan-hydrochlorothiazide 100-25 MG tablet  Commonly known as:  HYZAAR  Take 1 tablet by mouth daily.     omeprazole 20 MG capsule  Commonly known as:  PRILOSEC  Take 1 capsule (20 mg total) by mouth daily.     oxyCODONE-acetaminophen 5-325 MG tablet  Commonly known as:  PERCOCET/ROXICET  Take 1-2 tablets by mouth every 4 (four) hours as needed for severe pain.     polyethylene glycol powder powder  Commonly known as:  MIRALAX  Take 17 g by mouth  daily.     rivaroxaban 20 MG Tabs tablet  Commonly known as:  XARELTO  Take 1 tablet (20 mg total) by mouth daily with supper.     tiZANidine 2 MG tablet  Commonly known as:  ZANAFLEX  Take 1 tablet (2 mg total) by mouth every 8 (eight) hours as needed for muscle spasms.     trimethoprim-polymyxin b ophthalmic solution  Commonly known as:  POLYTRIM  Place 1 mL into both eyes daily.     venlafaxine XR 37.5 MG 24 hr capsule  Commonly known as:  EFFEXOR-XR  Take 1 capsule (37.5 mg total) by mouth daily.        Diagnostic Studies: Dg Chest 2 View  09/25/2015  CLINICAL DATA:  Preoperative exam prior to left knee replacement; history of Coronary artery disease, hypertension, pulmonary embolism, former smoker. EXAM: CHEST  2 VIEW COMPARISON:  PA and lateral chest x-ray dated June 08, 2011 and chest CT scan of June 20, 2013 FINDINGS: The lungs are mildly hyperinflated with hemidiaphragm flattening. The interstitial markings are coarse and are more conspicuous than in December of 2012. The heart and pulmonary vascularity are normal. The mediastinum is normal in width. There is mild tortuosity of the descending thoracic aorta. There is multilevel degenerative disc disease of the thoracic spine with calcification of the anterior longitudinal ligament. IMPRESSION: COPD with pulmonary fibrotic changes. No evidence of pneumonia nor CHF. Electronically Signed   By: David  Martinique M.D.   On: 09/25/2015 16:26    Disposition: 01-Home or Self Care      Discharge Instructions    Call MD / Call 911    Complete by:  As directed   If you experience chest pain or shortness of breath, CALL 911 and be transported to the hospital emergency room.  If you develope a fever above 101 F, pus (white drainage) or increased drainage or redness at the wound, or calf pain, call your surgeon's office.     Constipation Prevention    Complete by:  As directed   Drink plenty of fluids.  Prune juice may be helpful.   You may use a stool softener, such as Colace (over the counter) 100 mg twice a day.  Use MiraLax (over the counter) for constipation as needed.     Diet - low sodium heart  healthy    Complete by:  As directed      Driving restrictions    Complete by:  As directed   No driving for 2 weeks     Increase activity slowly as tolerated    Complete by:  As directed      Patient may shower    Complete by:  As directed   You may shower without a dressing once there is no drainage.  Do not wash over the wound.  If drainage remains, cover wound with plastic wrap and then shower.           Follow-up Information    Follow up with GRAVES,JOHN L, MD. Schedule an appointment as soon as possible for a visit in 2 weeks.   Specialty:  Orthopedic Surgery   Contact information:   [address] [place] [address] [phone]       Follow up with [facility].   Why:  They will contact you to schedule home therapy vists.   Contact information:   [address] [place] [address] [phone]        Signed: Theodosia Quay 10/11/2015, 11:47 AM

## 2015-10-12 ENCOUNTER — Telehealth: Payer: Self-pay | Admitting: *Deleted

## 2015-10-12 NOTE — Telephone Encounter (Signed)
Pt was on TCM list admitted for (L) knee osteoarthritis. Had (L) knee arthroplasty. Will f/u w/specialist Dr. Berenice Primas in 2 wks...Johny Chess

## 2015-10-13 ENCOUNTER — Encounter (HOSPITAL_COMMUNITY): Payer: Self-pay | Admitting: Orthopedic Surgery

## 2015-10-18 ENCOUNTER — Other Ambulatory Visit: Payer: Self-pay | Admitting: Family

## 2015-10-19 NOTE — Telephone Encounter (Signed)
Last refill was 09/17/15

## 2015-10-30 ENCOUNTER — Telehealth: Payer: Self-pay | Admitting: *Deleted

## 2015-10-30 MED ORDER — VENLAFAXINE HCL ER 37.5 MG PO CP24: 37.5000 mg | ORAL_CAPSULE | Freq: Every day | ORAL | Status: DC

## 2015-10-30 MED ORDER — POLYETHYLENE GLYCOL 3350 17 GM/SCOOP PO POWD: 17.0000 g | Freq: Every day | ORAL | Status: DC

## 2015-10-30 MED ORDER — OMEPRAZOLE 20 MG PO CPDR: 20.0000 mg | DELAYED_RELEASE_CAPSULE | Freq: Every day | ORAL | Status: DC

## 2015-10-30 MED ORDER — LOSARTAN POTASSIUM-HCTZ 100-25 MG PO TABS: 1.0000 | ORAL_TABLET | Freq: Every day | ORAL | Status: DC

## 2015-10-30 MED ORDER — RIVAROXABAN 20 MG PO TABS: 20.0000 mg | ORAL_TABLET | Freq: Every day | ORAL | Status: DC

## 2015-10-30 NOTE — Telephone Encounter (Signed)
Left msg on triage requesting call back concerning refills. Called pt back no answer LMOM RTC...Aaron [name]

## 2015-10-30 NOTE — Telephone Encounter (Signed)
Pt return call back he stated he is needing new rx's on his xarelto, fenofibrate, effexor, losartan, and miralax. The miralax grams need to be change he stated the 357 g is only for 1 month. Inform pt will update & send all rx's to [facility]...Johny Chess

## 2015-11-01 ENCOUNTER — Other Ambulatory Visit: Payer: Self-pay | Admitting: Family

## 2015-11-02 NOTE — Telephone Encounter (Signed)
Medication not on list ?

## 2015-11-03 ENCOUNTER — Other Ambulatory Visit: Payer: Self-pay | Admitting: Family

## 2015-11-09 ENCOUNTER — Encounter: Payer: Self-pay | Admitting: Cardiology

## 2015-11-09 ENCOUNTER — Ambulatory Visit (INDEPENDENT_AMBULATORY_CARE_PROVIDER_SITE_OTHER): Payer: Medicare Other | Admitting: Cardiology

## 2015-11-09 VITALS — BP 130/64 | HR 70 | Ht 65.0 in | Wt 249.4 lb

## 2015-11-09 DIAGNOSIS — Z7901 Long term (current) use of anticoagulants: Secondary | ICD-10-CM

## 2015-11-09 DIAGNOSIS — I824Y9 Acute embolism and thrombosis of unspecified deep veins of unspecified proximal lower extremity: Secondary | ICD-10-CM | POA: Diagnosis not present

## 2015-11-09 DIAGNOSIS — I2510 Atherosclerotic heart disease of native coronary artery without angina pectoris: Secondary | ICD-10-CM

## 2015-11-09 DIAGNOSIS — I82409 Acute embolism and thrombosis of unspecified deep veins of unspecified lower extremity: Secondary | ICD-10-CM | POA: Insufficient documentation

## 2015-11-09 DIAGNOSIS — I10 Essential (primary) hypertension: Secondary | ICD-10-CM

## 2015-11-09 NOTE — Progress Notes (Signed)
[name] Date of Birth: 07-16-1945 Medical Record [id number]  History of Present Illness: Mr. Curtiss is seen for follow up today. Has known CD with stenting of the mid LAD in August of 2005 with Cypher stent for a 70% lesion. Nuclear study had showed evidence of anteroapical ischemia. Myoview in June 2014 was normal. Other issues include HLD, HTN, morbid obesity, depression, GERD, OSA. Has had 2 occurences of pulmonary emboli and is on chronic anticoagulation with Xarelto. He had recent left TKR without complications. Is planning to have right knee done in November. Denies any chest pain, SOB, or palpitations. Does note some swelling in left leg since surgery improved with compression hose. Is worried that he cannot afford to stay on Xarelto and may need to go back to Coumadin.  Current Outpatient Prescriptions on File Prior to Visit  Medication Sig Dispense Refill  . ALPRAZolam (XANAX) 0.5 MG tablet TAKE 1 TABLET BY MOUTH 3 TIMES A DAY AS NEEDED FOR ANXIETY 90 tablet 1  . atorvastatin (LIPITOR) 40 MG tablet Take 1 tablet by mouth  daily 90 tablet 1  . fenofibrate 160 MG tablet Take 1 tablet by mouth  daily 90 tablet 0  . GENTAK 0.3 % ophthalmic ointment Place 1 mL into both eyes daily.  1  . losartan-hydrochlorothiazide (HYZAAR) 100-25 MG tablet Take 1 tablet by mouth daily. 90 tablet 3  . omeprazole (PRILOSEC) 20 MG capsule Take 1 capsule (20 mg total) by mouth daily. 90 capsule 3  . oxyCODONE-acetaminophen (PERCOCET/ROXICET) 5-325 MG tablet Take 1-2 tablets by mouth every 4 (four) hours as needed for severe pain. 60 tablet 0  . polyethylene glycol powder (MIRALAX) powder Take 17 g by mouth daily. 1071 g 3  . rivaroxaban (XARELTO) 20 MG TABS tablet Take 1 tablet (20 mg total) by mouth daily with supper. 90 tablet 3  . tiZANidine (ZANAFLEX) 2 MG tablet Take 1 tablet (2 mg total) by mouth every 8 (eight) hours as needed for muscle spasms. 50 tablet 0  . traMADol (ULTRAM) 50 MG tablet TAKE  2 TABLETS BY MOUTH 3 TIMES A DAY AS NEEDED FOR MODERATE TO SEVERE PAIN 180 tablet 0  . trimethoprim-polymyxin b (POLYTRIM) ophthalmic solution Place 1 mL into both eyes daily.  1  . venlafaxine XR (EFFEXOR-XR) 37.5 MG 24 hr capsule Take 1 capsule (37.5 mg total) by mouth daily. 90 capsule 3   No current facility-administered medications on file prior to visit.    Allergies  Allergen Reactions  . Wellbutrin [Bupropion] Other (See Comments)    Hallucinations, sweating  . Amoxicillin Palpitations    Past Medical History  Diagnosis Date  . Coronary artery disease     DES LAD 2005  . Hypertension   . Hyperlipidemia   . Depression   . GERD (gastroesophageal reflux disease)   . Insomnia   . Sleep apnea, obstructive   . Pulmonary embolism ([place]) ~ 03/2006    bilaterally  . Osteoarthritis   . Shortness of breath 06/08/11    "w/exertion; that's why I'm here"  . Heart murmur   . Blood transfusion     "@ birth"  . Anemia     "@ birth"  . 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 PTSD (post-traumatic stress disorder)   . Seasonal allergies   . PONV (postoperative nausea and vomiting)     2006 - knee surgery  . Anxiety   . Constipation due to  pain medication   . Cancer ([place])     Head - skin cancer  . Psoriasis     Past Surgical History  Procedure Laterality Date  . Spinal injections  2006-2007    "maybe 10 injections for herniated discs"  . Carpel tunnel release Bilateral ~ 06/2005/~ 08/2005    right/left  . Coronary stent placement  02/13/2004    1  . Inguinal hernia repair Right 1983  . Inguinal hernia repair Left 1985  . Tonsillectomy      "when I was real young"  . Eye surgery      age 18; "ruptured  right pupil"  . Eye surgery      age 89  . Enucleation Right     age 51  . Eye surgery  1957    placed artificial right eye  . Cardiac catheterization    . Knee arthroscopy  ~ 06/2005    right  . Coronary angioplasty    . Total knee  arthroplasty Left 10/09/2015    Procedure: TOTAL KNEE ARTHROPLASTY;  Surgeon: Dorna Leitz, MD;  Location: [facility];  Service: Orthopedics;  Laterality: Left;    History  Smoking status  . Former Smoker -- 1.50 packs/day for 25 years  . Types: Cigarettes  . Quit date: 07/10/1995  Smokeless tobacco  . Never Used    History  Alcohol Use No    Family History  Problem Relation Age of Onset  . Breast cancer Sister   . Liver cancer Sister   . Asthma Father   . Asthma Sister   . Asthma Brother   . Emphysema Father     Review of Systems: The review of systems is per the HPI.  All other systems were reviewed and are negative.  Physical Exam: BP 130/64 mmHg  Pulse 70  Ht 5\' 5"  (1.651 m)  Wt 113.116 kg (249 lb 6 oz)  BMI 41.50 kg/m2 Patient is pleasant and in no acute distress. He is morbidly obese.  Skin is warm and dry. Color is normal.  HEENT is unremarkable. Normocephalic/atraumatic. PERRL. Sclera are nonicteric. Neck is supple. No masses. No JVD. Lungs are clear. Cardiac exam shows a regular rate and rhythm. No gallop or murmur. Abdomen is soft. Extremities are without edema. He has a bandage on left knee and is wearing a compression hose on left leg. Gait and ROM are intact. No gross neurologic deficits noted.  LABORATORY DATA:   Lab Results  Component Value Date   WBC 11.3* 10/11/2015   HGB 10.3* 10/11/2015   HCT 29.4* 10/11/2015   PLT 225 10/11/2015   GLUCOSE 178* 10/10/2015   CHOL 151 02/11/2015   TRIG 114.0 02/11/2015   HDL 41.10 02/11/2015   LDLCALC 87 02/11/2015   ALT 17 09/25/2015   AST 20 09/25/2015   NA 132* 10/10/2015   K 4.0 10/10/2015   CL 98* 10/10/2015   CREATININE 1.38* 10/10/2015   BUN 32* 10/10/2015   CO2 21* 10/10/2015   TSH 4.58* 02/11/2015   PSA 0.58 02/11/2015   INR 1.18 09/25/2015   HGBA1C 6.3* 10/09/2015   [id number]  NSR rate 71bpm. Normal. I have personally reviewed and interpreted this study.    Assessment / Plan: 1. CAD - s/p stent  of the LAD in 2005. No active symptoms. Asymptomatic. Normal Myoview June 2014. Continue medical Rx.  2. HTN - blood pressure is  well controlled.  3. HLD on therapy.  Will switch to atorvastatin 40 mg daily.  4. Obesity - needs to work on weight loss and increasing aerobic activities.   5. Recurrent PEs. On anticoagulation. If unable to afford Xarelto will need to switch to Coumadin. Due to lower co-pay this will need to be managed by primary care. He will let us know if he plans to switch.  6. Osteoarthritis. S/p left TKR. Plan right TKR in Nov.    I will follow up in 6 months.

## 2015-12-18 ENCOUNTER — Other Ambulatory Visit: Payer: Self-pay | Admitting: Family

## 2015-12-21 NOTE — Telephone Encounter (Signed)
Faxed [facility] to [facility].../lmb 

## 2015-12-28 ENCOUNTER — Telehealth: Payer: Self-pay | Admitting: *Deleted

## 2015-12-28 NOTE — Telephone Encounter (Signed)
Left msg on triage stating needing refill on his Tramadol. Last rx was only for #60, but he normally get #180. Wanting rx written for #180...Johny Chess

## 2015-12-29 MED ORDER — TRAMADOL HCL 50 MG PO TABS: ORAL_TABLET | ORAL | Status: DC

## 2015-12-29 NOTE — Telephone Encounter (Signed)
Notified pt rx fax bck to [facility].../lmb

## 2016-01-06 ENCOUNTER — Other Ambulatory Visit: Payer: Self-pay | Admitting: Cardiology

## 2016-01-06 ENCOUNTER — Other Ambulatory Visit: Payer: Self-pay | Admitting: Family

## 2016-01-06 NOTE — Telephone Encounter (Signed)
Rx Refill

## 2016-01-31 ENCOUNTER — Other Ambulatory Visit: Payer: Self-pay | Admitting: Family

## 2016-02-01 NOTE — Telephone Encounter (Signed)
Last OV was 08/20/15  Last refill was 12/29/15

## 2016-02-16 ENCOUNTER — Other Ambulatory Visit: Payer: Self-pay | Admitting: Family

## 2016-02-18 ENCOUNTER — Other Ambulatory Visit: Payer: Self-pay | Admitting: Family

## 2016-02-19 NOTE — Telephone Encounter (Signed)
Script fax to [facility].../lmb

## 2016-02-24 ENCOUNTER — Other Ambulatory Visit: Payer: Self-pay | Admitting: Orthopedic Surgery

## 2016-02-26 ENCOUNTER — Telehealth: Payer: Self-pay

## 2016-02-26 NOTE — Telephone Encounter (Signed)
Received surgical clearance from [facility].Dr Martinique cleared patient for upcoming surgery.Advised ok to hold Xarelto 2 days prior to surgery.Note faxed back to fax # [phone]. Patient called left message on personal voice mail hold Xarelto 2 days prior to surgery.

## 2016-03-06 ENCOUNTER — Other Ambulatory Visit: Payer: Self-pay | Admitting: Family

## 2016-03-08 ENCOUNTER — Ambulatory Visit (INDEPENDENT_AMBULATORY_CARE_PROVIDER_SITE_OTHER): Payer: Medicare Other | Admitting: Family

## 2016-03-08 ENCOUNTER — Encounter: Payer: Self-pay | Admitting: Family

## 2016-03-08 ENCOUNTER — Other Ambulatory Visit (INDEPENDENT_AMBULATORY_CARE_PROVIDER_SITE_OTHER): Payer: Medicare Other

## 2016-03-08 VITALS — BP 160/80 | HR 68 | Temp 98.5°F | Resp 16 | Ht 65.0 in | Wt 248.8 lb

## 2016-03-08 DIAGNOSIS — Z23 Encounter for immunization: Secondary | ICD-10-CM

## 2016-03-08 DIAGNOSIS — J3489 Other specified disorders of nose and nasal sinuses: Secondary | ICD-10-CM

## 2016-03-08 DIAGNOSIS — I10 Essential (primary) hypertension: Secondary | ICD-10-CM

## 2016-03-08 DIAGNOSIS — Z7289 Other problems related to lifestyle: Secondary | ICD-10-CM | POA: Diagnosis not present

## 2016-03-08 DIAGNOSIS — Z01818 Encounter for other preprocedural examination: Secondary | ICD-10-CM

## 2016-03-08 DIAGNOSIS — M1711 Unilateral primary osteoarthritis, right knee: Secondary | ICD-10-CM | POA: Diagnosis not present

## 2016-03-08 LAB — COMPREHENSIVE METABOLIC PANEL
ALBUMIN: 4.2 g/dL (ref 3.5–5.2)
ALT: 12 U/L (ref 0–53)
AST: 16 U/L (ref 0–37)
Alkaline Phosphatase: 68 U/L (ref 39–117)
BUN: 18 mg/dL (ref 6–23)
CALCIUM: 9.1 mg/dL (ref 8.4–10.5)
CHLORIDE: 103 meq/L (ref 96–112)
CO2: 28 mEq/L (ref 19–32)
Creatinine, Ser: 1.07 mg/dL (ref 0.40–1.50)
GFR: 72.56 mL/min (ref >60.00)
Glucose, Bld: 105 mg/dL — ABNORMAL HIGH (ref 70–99)
POTASSIUM: 4.2 meq/L (ref 3.5–5.1)
SODIUM: 138 meq/L (ref 135–145)
Total Bilirubin: 0.3 mg/dL (ref 0.2–1.2)
Total Protein: 7.2 g/dL (ref 6.0–8.3)

## 2016-03-08 LAB — CBC
HCT: 36.2 % — ABNORMAL LOW (ref 39.0–52.0)
Hemoglobin: 12.3 g/dL — ABNORMAL LOW (ref 13.0–17.0)
MCHC: 34 g/dL (ref 30.0–36.0)
MCV: 82.2 fl (ref 78.0–100.0)
PLATELETS: 250 10*3/uL (ref 150.0–400.0)
RBC: 4.41 Mil/uL (ref 4.22–5.81)
RDW: 15.3 % (ref 11.5–15.5)
WBC: 6.1 10*3/uL (ref 4.0–10.5)

## 2016-03-08 LAB — PROTIME-INR
INR: 1.2 ratio — AB (ref 0.8–1.0)
PROTHROMBIN TIME: 12.1 s (ref 9.6–13.1)

## 2016-03-08 LAB — HEMOGLOBIN A1C: Hgb A1c MFr Bld: 6.4 % (ref 4.6–6.5)

## 2016-03-08 LAB — APTT: APTT: 29.3 s (ref 23.4–32.7)

## 2016-03-08 MED ORDER — POLYETHYLENE GLYCOL 3350 17 GM/SCOOP PO POWD: 17.0000 g | Freq: Every day | ORAL | 3 refills | Status: DC

## 2016-03-08 NOTE — Assessment & Plan Note (Signed)
Hypertension remains labile with current regimen and above goal 140/90. No adverse side effects or symptoms of end organ damage present upon physical exam. Continue current dosage of losartan-hydrochlorothiazide. Continue to monitor blood pressure at home. If blood pressure remains elevated we'll consider additional agent to help control as elevation today may be secondary to pain located in the right knee. Decrease sodium in diet.

## 2016-03-08 NOTE — Telephone Encounter (Signed)
Last refill was 02/01/16

## 2016-03-08 NOTE — Patient Instructions (Addendum)
Thank you for choosing [facility].  SUMMARY AND INSTRUCTIONS:  Please continue to take your medication as prescribed.   Surgical clearance will be completed upon your blood work results.   Please follow up with dermatology.   Labs:  Please stop by the lab on the lower level of the building for your blood work. Your results will be released to [facility] (or called to you) after review, usually within 72 hours after test completion. If any changes need to be made, you will be notified at that same time.  1.) The lab is open from 7:30am to 5:30 pm Monday-Friday 2.) No appointment is necessary 3.) Fasting (if needed) is 6-8 hours after food and drink; black coffee and water are okay   Follow up:  If your symptoms worsen or fail to improve, please contact our office for further instruction, or in case of emergency go directly to the emergency room at the closest medical facility.

## 2016-03-08 NOTE — Assessment & Plan Note (Signed)
Lesion of her nose with benign appearance although concern for possible underlying malignancy given most recent bleeding. Recommend follow-up with dermatology for possible biopsy if necessary.

## 2016-03-08 NOTE — Assessment & Plan Note (Signed)
Preoperative examination performed with review of allergies, current medications, medical history, surgical history, social history, and family history with no new significant findings. Physical exam performed with no current indications to restrict surgery at this time. Obtain PTT, INR/PT, hemoglobin A1c, complete metabolic panel, and CBC. Surgical clearance pending blood work results. Most recent EKG reviewed with no significant abnormalities.

## 2016-03-08 NOTE — Progress Notes (Signed)
Subjective:    Patient ID: Aaron [name], male    DOB: [birth year], 70 y.o.   MRN: [id number]  Chief Complaint  Patient presents with  . surgical clearance    needs clearance to have knee replacement on right knee, place on nose     HPI:  Aaron [name] is a 70 y.o. male who  has a past medical history of Anemia; Anxiety; Blood transfusion; Cancer ([place]); Constipation due to pain medication; Coronary artery disease; Depression; GERD (gastroesophageal reflux disease); Headache(784.0); Heart murmur; Hyperlipidemia; Hypertension; Insomnia; Osteoarthritis; PONV (postoperative nausea and vomiting); Psoriasis; PTSD (post-traumatic stress disorder); Pulmonary embolism ([place]) (~ 03/2006); Seasonal allergies; Shortness of breath (06/08/11); and Sleep apnea, obstructive. and presents today for an office visit for surgical clearance.  1.) Right knee pain - Continues to experience the associated symptom of pain located in his right knee with the recommendation for total knee replacement.   2.) Spot on nose - This is a new problem. Associated symptom of a spot located on his nose has been going on for about weeks. There was initially some blood which he attribued to the possible combination of melatonin and Xarelto. He stopped taking the melatonin and the symptoms improved. He noted about 5 days ago that it started to bleed again.   Allergies  Allergen Reactions  . Wellbutrin [Bupropion] Other (See Comments)    Hallucinations, sweating  . Amoxicillin Palpitations      Outpatient Medications Prior to Visit  Medication Sig Dispense Refill  . ALPRAZolam (XANAX) 0.5 MG tablet TAKE 1 TABLET BY MOUTH 3 TIMES A DAY AS NEEDED FOR ANXIETY 90 tablet 1  . atorvastatin (LIPITOR) 40 MG tablet Take 1 tablet by mouth  daily 90 tablet 0  . fenofibrate 160 MG tablet Take 1 tablet by mouth  daily 90 tablet 0  . GENTAK 0.3 % ophthalmic ointment Place 1 mL into both eyes daily.  1  . losartan-hydrochlorothiazide  (HYZAAR) 100-25 MG tablet Take 1 tablet by mouth daily. 90 tablet 3  . omeprazole (PRILOSEC) 20 MG capsule Take 1 capsule (20 mg total) by mouth daily. 90 capsule 3  . oxyCODONE-acetaminophen (PERCOCET/ROXICET) 5-325 MG tablet Take 1-2 tablets by mouth every 4 (four) hours as needed for severe pain. 60 tablet 0  . rivaroxaban (XARELTO) 20 MG TABS tablet Take 1 tablet (20 mg total) by mouth daily with supper. 90 tablet 3  . tiZANidine (ZANAFLEX) 2 MG tablet Take 1 tablet (2 mg total) by mouth every 8 (eight) hours as needed for muscle spasms. 50 tablet 0  . traMADol (ULTRAM) 50 MG tablet TAKE 2 TABLETS BY MOUTH 3 TIMES A DAY AS NEEDED FOR MODERATE TO SEVERE PAIN 180 tablet 0  . trimethoprim-polymyxin b (POLYTRIM) ophthalmic solution Place 1 mL into both eyes daily.  1  . venlafaxine XR (EFFEXOR-XR) 37.5 MG 24 hr capsule Take 1 capsule (37.5 mg total) by mouth daily. 90 capsule 3  . polyethylene glycol powder (MIRALAX) powder Take 17 g by mouth daily. 1071 g 3   No facility-administered medications prior to visit.       Past Surgical History:  Procedure Laterality Date  . CARDIAC CATHETERIZATION    . carpel tunnel release Bilateral ~ 06/2005/~ 08/2005   right/left  . CORONARY ANGIOPLASTY    . CORONARY STENT PLACEMENT  02/13/2004   1  . ENUCLEATION Right    age 89  . EYE SURGERY     age 46; "ruptured  right pupil"  .  EYE SURGERY     age 46  . EYE SURGERY  1957   placed artificial right eye  . INGUINAL HERNIA REPAIR Right 1983  . INGUINAL HERNIA REPAIR Left 1985  . KNEE ARTHROSCOPY  ~ 06/2005   right  . spinal injections  2006-2007   "maybe 10 injections for herniated discs"  . TONSILLECTOMY     "when I was real young"  . TOTAL KNEE ARTHROPLASTY Left 10/09/2015   Procedure: TOTAL KNEE ARTHROPLASTY;  Surgeon: Dorna Leitz, MD;  Location: [facility];  Service: Orthopedics;  Laterality: Left;      Past Medical History:  Diagnosis Date  . Anemia    "@ birth"  . Anxiety   . Blood  transfusion    "@ birth"  . Cancer ([place])    Head - skin cancer  . Constipation due to pain medication   . Coronary artery disease    DES LAD 2005  . Depression   . GERD (gastroesophageal reflux disease)   . 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 Heart murmur   . Hyperlipidemia   . Hypertension   . Insomnia   . Osteoarthritis   . PONV (postoperative nausea and vomiting)    2006 - knee surgery  . Psoriasis   . PTSD (post-traumatic stress disorder)   . Pulmonary embolism ([place]) ~ 03/2006   bilaterally  . Seasonal allergies   . Shortness of breath 06/08/11   "w/exertion; that's why I'm here"  . Sleep apnea, obstructive     Review of Systems   Constitutional: Denies fever, chills, fatigue, or significant weight gain/loss. HENT: Head: Denies headache or neck pain Ears: Denies changes in hearing, ringing in ears, earache, drainage Nose: Denies discharge, stuffiness, itching, nosebleed, sinus pain Throat: Denies sore throat, hoarseness, dry mouth, sores, thrush Eyes: Denies loss/changes in vision, pain, redness, blurry/double vision, flashing lights Cardiovascular: Denies chest pain/discomfort, tightness, palpitations, shortness of breath with activity, difficulty lying down, swelling, sudden awakening with shortness of breath Respiratory: Denies shortness of breath, cough, sputum production, wheezing Gastrointestinal: Denies dysphasia, heartburn, change in appetite, nausea, change in bowel habits, rectal bleeding, constipation, diarrhea, yellow skin or eyes Genitourinary: Denies frequency, urgency, burning/pain, blood in urine, incontinence, change in urinary strength. Musculoskeletal: Denies muscle/joint pain (other than below), stiffness, back pain, redness or swelling of joints, trauma Positive for right knee pain Skin: Denies rashes, lumps, itching, dryness, color changes, or hair/nail changes Positive for lesion on nose.  Neurological: Denies  dizziness, fainting, seizures, weakness, numbness, tingling, tremor Psychiatric - Denies nervousness, stress, depression or memory loss Endocrine: Denies heat or cold intolerance, sweating, frequent urination, excessive thirst, changes in appetite Hematologic: Denies ease of bruising or bleeding        Objective:    BP (!) 160/80 (BP Location: Left Arm, Patient Position: Sitting, Cuff Size: Large)   Pulse 68   Temp 98.5 F (36.9 C) (Oral)   Resp 16   Ht 5\' 5"  (1.651 m)   Wt 248 lb 12.8 oz (112.9 kg)   SpO2 96%   BMI 41.40 kg/m  Nursing note and vital signs reviewed.   Physical Exam  Constitutional: He is oriented to person, place, and time. He appears well-developed and well-nourished.  HENT:  Head: Normocephalic.  Right Ear: Hearing, tympanic membrane, external ear and ear canal normal.  Left Ear: Hearing, tympanic membrane, external ear and ear canal normal.  Nose: Nose normal.  Mouth/Throat: Uvula is midline, oropharynx  is clear and moist and mucous membranes are normal.  Approximately 0.5 cm lesion with white center and small appears that appear with scratches located on the right side of the nose. No infection and no clear defined borders.   Eyes: Conjunctivae and EOM are normal. Pupils are equal, round, and reactive to light.  Neck: Neck supple. No JVD present. No tracheal deviation present. No thyromegaly present.  Cardiovascular: Normal rate, regular rhythm, normal heart sounds and intact distal pulses.   Pulmonary/Chest: Effort normal and breath sounds normal.  Abdominal: Soft. Bowel sounds are normal. He exhibits no distension and no mass. There is no tenderness. There is no rebound and no guarding.  Musculoskeletal: Normal range of motion. He exhibits no edema or tenderness.  Lymphadenopathy:    He has no cervical adenopathy.  Neurological: He is alert and oriented to person, place, and time. He has normal reflexes. No cranial nerve deficit. He exhibits normal  muscle tone. Coordination normal.  Skin: Skin is warm and dry.  Psychiatric: He has a normal mood and affect. His behavior is normal. Judgment and thought content normal.       Assessment & Plan:   Problem List Items Addressed This Visit      Cardiovascular and Mediastinum   Hypertension    Hypertension remains labile with current regimen and above goal 140/90. No adverse side effects or symptoms of end organ damage present upon physical exam. Continue current dosage of losartan-hydrochlorothiazide. Continue to monitor blood pressure at home. If blood pressure remains elevated we'll consider additional agent to help control as elevation today may be secondary to pain located in the right knee. Decrease sodium in diet.        Musculoskeletal and Integument   Osteoarthritis of right knee    Osteoarthritis of the right knee with the recommendation for total knee arthroplasty. Continue pain management with oxycodone-acetaminophen. Continue nonpharmacological therapies including ice and elevation with pending total knee arthroplasty.      Relevant Orders   CBC (Completed)   Hemoglobin A1c (Completed)   Comprehensive metabolic panel (Completed)   PTT (Completed)   INR/PT (Completed)     Other   Pre-operative examination for internal medicine    Preoperative examination performed with review of allergies, current medications, medical history, surgical history, social history, and family history with no new significant findings. Physical exam performed with no current indications to restrict surgery at this time. Obtain PTT, INR/PT, hemoglobin A1c, complete metabolic panel, and CBC. Surgical clearance pending blood work results. Most recent EKG reviewed with no significant abnormalities.      Relevant Orders   CBC (Completed)   Hemoglobin A1c (Completed)   Comprehensive metabolic panel (Completed)   PTT (Completed)   INR/PT (Completed)   Lesion of nose    Lesion of her nose with  benign appearance although concern for possible underlying malignancy given most recent bleeding. Recommend follow-up with dermatology for possible biopsy if necessary.       Other Visit Diagnoses    Other problems related to lifestyle    -  Primary   Relevant Orders   Hepatitis C antibody   Encounter for immunization       Relevant Orders   Flu vaccine HIGH DOSE PF (Completed)       I am having Mr. Librizzi maintain his GENTAK, trimethoprim-polymyxin b, tiZANidine, oxyCODONE-acetaminophen, rivaroxaban, omeprazole, losartan-hydrochlorothiazide, venlafaxine XR, atorvastatin, fenofibrate, traMADol, ALPRAZolam, and polyethylene glycol powder.   Meds ordered this encounter  Medications  . polyethylene  glycol powder (MIRALAX) powder    Sig: Take 17 g by mouth daily.    Dispense:  1071 g    Refill:  3     Follow-up: Return if symptoms worsen or fail to improve.  Mauricio Po, FNP

## 2016-03-08 NOTE — Assessment & Plan Note (Signed)
Osteoarthritis of the right knee with the recommendation for total knee arthroplasty. Continue pain management with oxycodone-acetaminophen. Continue nonpharmacological therapies including ice and elevation with pending total knee arthroplasty.

## 2016-03-09 ENCOUNTER — Encounter: Payer: Self-pay | Admitting: Family

## 2016-03-09 LAB — HEPATITIS C ANTIBODY: HCV Ab: NEGATIVE

## 2016-03-09 NOTE — Telephone Encounter (Signed)
Rx sent 

## 2016-03-16 ENCOUNTER — Other Ambulatory Visit: Payer: Self-pay | Admitting: Family

## 2016-03-16 ENCOUNTER — Other Ambulatory Visit: Payer: Self-pay | Admitting: Cardiology

## 2016-03-25 ENCOUNTER — Encounter (HOSPITAL_COMMUNITY)
Admission: RE | Admit: 2016-03-25 | Discharge: 2016-03-25 | Disposition: A | Discharge status: Home / Self Care | Payer: Medicare Other | Source: Ambulatory Visit | Attending: Orthopedic Surgery | Admitting: Orthopedic Surgery

## 2016-03-25 ENCOUNTER — Encounter (HOSPITAL_COMMUNITY): Payer: Self-pay

## 2016-03-25 DIAGNOSIS — Z01812 Encounter for preprocedural laboratory examination: Secondary | ICD-10-CM | POA: Insufficient documentation

## 2016-03-25 DIAGNOSIS — M1711 Unilateral primary osteoarthritis, right knee: Secondary | ICD-10-CM | POA: Diagnosis not present

## 2016-03-25 HISTORY — DX: Complete loss of teeth, unspecified cause, unspecified class: K08.109

## 2016-03-25 HISTORY — DX: Umbilical hernia without obstruction or gangrene: K42.9

## 2016-03-25 HISTORY — DX: Complete loss of teeth, unspecified cause, unspecified class: Z97.2

## 2016-03-25 LAB — COMPREHENSIVE METABOLIC PANEL
ALT: 15 U/L — AB (ref 17–63)
ANION GAP: 9 (ref 5–15)
AST: 21 U/L (ref 15–41)
Albumin: 4.2 g/dL (ref 3.5–5.0)
Alkaline Phosphatase: 58 U/L (ref 38–126)
BUN: 15 mg/dL (ref 6–20)
CHLORIDE: 106 mmol/L (ref 101–111)
CO2: 25 mmol/L (ref 22–32)
CREATININE: 1.07 mg/dL (ref 0.61–1.24)
Calcium: 9.5 mg/dL (ref 8.9–10.3)
Glucose, Bld: 121 mg/dL — ABNORMAL HIGH (ref 65–99)
POTASSIUM: 3.9 mmol/L (ref 3.5–5.1)
Sodium: 140 mmol/L (ref 135–145)
Total Bilirubin: 0.6 mg/dL (ref 0.3–1.2)
Total Protein: 7.1 g/dL (ref 6.5–8.1)

## 2016-03-25 LAB — CBC WITH DIFFERENTIAL/PLATELET
Basophils Absolute: 0.1 10*3/uL (ref 0.0–0.1)
Basophils Relative: 1 %
EOS PCT: 4 %
Eosinophils Absolute: 0.2 10*3/uL (ref 0.0–0.7)
HCT: 38.3 % — ABNORMAL LOW (ref 39.0–52.0)
Hemoglobin: 12.6 g/dL — ABNORMAL LOW (ref 13.0–17.0)
LYMPHS ABS: 1.5 10*3/uL (ref 0.7–4.0)
LYMPHS PCT: 27 %
MCH: 27.6 pg (ref 26.0–34.0)
MCHC: 32.9 g/dL (ref 30.0–36.0)
MCV: 83.8 fL (ref 78.0–100.0)
MONO ABS: 0.5 10*3/uL (ref 0.1–1.0)
MONOS PCT: 9 %
Neutro Abs: 3.3 10*3/uL (ref 1.7–7.7)
Neutrophils Relative %: 59 %
PLATELETS: 232 10*3/uL (ref 150–400)
RBC: 4.57 MIL/uL (ref 4.22–5.81)
RDW: 14.2 % (ref 11.5–15.5)
WBC: 5.7 10*3/uL (ref 4.0–10.5)

## 2016-03-25 LAB — URINALYSIS, ROUTINE W REFLEX MICROSCOPIC
Bilirubin Urine: NEGATIVE
GLUCOSE, UA: NEGATIVE mg/dL
Hgb urine dipstick: NEGATIVE
KETONES UR: NEGATIVE mg/dL
LEUKOCYTES UA: NEGATIVE
Nitrite: NEGATIVE
PH: 7 (ref 5.0–8.0)
Protein, ur: 30 mg/dL — AB
Specific Gravity, Urine: 1.013 (ref 1.005–1.030)

## 2016-03-25 LAB — TYPE AND SCREEN
ABO/RH(D): O POS
Antibody Screen: NEGATIVE

## 2016-03-25 LAB — PROTIME-INR
INR: 1.11
PROTHROMBIN TIME: 14.4 s (ref 11.4–15.2)

## 2016-03-25 LAB — URINE MICROSCOPIC-ADD ON
BACTERIA UA: NONE SEEN
WBC, UA: NONE SEEN WBC/hpf (ref 0–5)

## 2016-03-25 LAB — SURGICAL PCR SCREEN
MRSA, PCR: NEGATIVE
Staphylococcus aureus: NEGATIVE

## 2016-03-25 LAB — APTT: aPTT: 32 seconds (ref 24–36)

## 2016-03-25 NOTE — Progress Notes (Signed)
Pt denies SOB and chest pain but is under the care of Dr. Peter Martinique, Cardiology. Pt stated that he was instructed by MD to stop Xarelto 2 days prior to procedure ( last dose Friday evening 04/01/26). Pt chart forwarded to anesthesia for review. Pt had previous knee replacement surgery in April 2017, see anesthesia note 09/25/15.

## 2016-03-25 NOTE — Pre-Procedure Instructions (Signed)
Aaron [name]  03/25/2016      [id number] - [place] [name], [place] - [name] [id number] [place] [id number] Phone: [phone] Fax: [phone]  [place] [place] [facility] [address] [place] [address] [place] [address] Phone: [phone] Fax: [phone]    Your procedure is scheduled on Monday, April 04, 2016  Report to [name] at 10:30 A.M.  Call this number if you have problems the morning of surgery:  [phone]   Remember:  Do not eat food or drink liquids after midnight. Sunday, April 03, 2016  Take these medicines the morning of surgery with A SIP OF WATER : omeprazole (PRILOSEC), venlafaxine XR [name]), if needed: pain medication ( [name] OR [name]), [name] for anxiety Stop taking Aspirin, vitamins, fish oil, and herbal medications. Do not take any NSAIDs ie: Ibuprofen, Advil, Naproxen, BC and Goody Powder; stop Monday, March 28, 2016.  Do not wear jewelry, make-up or nail polish.  Do not wear lotions, powders, or perfumes, or deoderant.  Do not shave 48 hours prior to surgery.  Men may shave face and neck.  Do not bring valuables to the hospital.  [name] is not responsible for any belongings or valuables.  Contacts, dentures or bridgework may not be worn into surgery.  Leave your suitcase in the car.  After surgery it may be brought to your room.  For patients admitted to the hospital, discharge time will be determined by your treatment team.  Patients discharged the day of surgery will not be allowed to drive [name].   Name and phone number of your driver:  Special instructions: [name] - Preparing for Surgery  Before surgery, you can play an important role.  Because skin is not sterile, your skin needs to be as free of germs as possible.  You can reduce the number of germs on you skin by washing with CHG  (chlorahexidine gluconate) soap before surgery.  CHG is an antiseptic cleaner which kills germs and bonds with the skin to continue killing germs even after washing.  Please DO NOT use if you have an allergy to CHG or antibacterial soaps.  If your skin becomes reddened/irritated stop using the CHG and inform your nurse when you arrive at [name].  Do not shave (including legs and underarms) for at least 48 hours prior to the first CHG shower.  You may shave your face.  Please follow these instructions carefully:   1.  Shower with CHG Soap the night before surgery and the morning of Surgery.  2.  If you choose to wash your hair, wash your hair first as usual with your normal shampoo.  3.  After you shampoo, rinse your hair and body thoroughly to remove the Shampoo.  4.  Use CHG as you would any other liquid soap.  You can apply chg directly  to the skin and wash gently with scrungie or a clean washcloth.  5.  Apply the CHG Soap to your body ONLY FROM THE NECK DOWN.  Do not use on open wounds or open sores.  Avoid contact with your eyes, ears, mouth and genitals (private parts).  Wash genitals (private parts) with your normal soap.  6.  Wash thoroughly, paying special attention to the area where your surgery will be performed.  7.  Thoroughly rinse your body with warm water from the neck down.  8.  DO NOT shower/wash with your normal soap after using and rinsing off the CHG Soap.  9.  Pat yourself dry with a clean towel.            10.  Wear clean pajamas.            11.  Place clean sheets on your bed the night of your first shower and do not sleep with pets.  Day of Surgery  Do not apply any lotions/deodorants the morning of surgery.  Please wear clean clothes to the hospital/surgery [name]. Please read over the following fact sheets that you were given. Pain Booklet, Coughing and Deep Breathing, Blood Transfusion Information, MRSA Information and Surgical Site Infection  Prevention

## 2016-03-28 NOTE — Progress Notes (Addendum)
Anesthesia Chart Review:  Pt is a 70 year old male scheduled for R total knee arthroplasty on 04/04/2016 with Dorna Leitz, MD.   - Cardiologist is Dr. Peter Martinique who cleared pt for surgery.  - PCP is Mauricio Po, FNP who is aware of upcoming surgery.   PMH includes:  CAD (DES to LAD 2005), HTN, hyperlipidemia, OSA, PE (2007), heart murmur, PTSD, post-op N/V. Former smoker. BMI 41. S/p L TKA 10/09/15.   Medications include: lipitor, fenofibrate, losartan-hctz, prilosec, xarelto. I received permission from Dr. Martinique to have the pt stop xarelto 3 days before surgery and notified pt by telephone.   Preoperative labs reviewed.    Chest x-ray 09/25/15 reviewed. COPD with pulmonary fibrotic changes. No evidence of pneumonia nor CHF.  EKG 11/27/14: NSR.   10/06/13 Sleep Study (Dr. Halford Chessman): MildOSA with AHI of 5, and SaO2 low of 90%. Of note is that his RDI was 61.4. Additional therapies include weight loss, CPAP, oral appliance, or surgical evaluation.  Nuclear stress test 12/04/12: Normal stress nuclear study. There is no scar or ischemia. This is a low risk scan. LV Ejection Fraction: 60%. LV Wall Motion: Normal Wall Motion.  06/09/11 Echo (done during admission for recurrent PE) Study Conclusions: - Left ventricle: The cavity size was normal. Wall thickness was normal. - Right ventricle: RV appears mildly dilated. RV function appears mild to moderately depressed. Poor windows limit study. The cavity size was mildly dilated. Systolic function was moderately reduced. - Impressions: Recommend repeat evaluation with echo contrast to get better evaluation of function and wall motion.   Normal spirometry 07/20/11.  Cardiac cath 02/13/04:  1. Single vessel obstructive coronary artery disease. 2. Successful stenting of the mid left anterior descending  If no changes, I anticipate pt can proceed with surgery as scheduled.   Willeen Cass, FNP-BC [facility]  [facility]/Anesthesiology Phone: [phone] 03/29/2016 2:08 PM

## 2016-04-03 MED ORDER — CLINDAMYCIN PHOSPHATE 900 MG/50ML IV SOLN
900.0000 mg | INTRAVENOUS | Status: AC
  Administered 2016-04-04: 900 mg via INTRAVENOUS
  Filled 2016-04-03 (×2): qty 50

## 2016-04-03 NOTE — H&P (Signed)
TOTAL KNEE ADMISSION H&P  Patient is being admitted for right total knee arthroplasty.  Subjective:  Chief Complaint:right knee pain.  HPI: Aaron [name], 70 y.o. male, has a history of pain and functional disability in the right knee due to arthritis and has failed non-surgical conservative treatments for greater than 12 weeks to includeNSAID's and/or analgesics, corticosteriod injections, viscosupplementation injections, weight reduction as appropriate and activity modification.  Onset of symptoms was gradual, starting 5 years ago with gradually worsening course since that time. The patient noted no past surgery on the right knee(s).  Patient currently rates pain in the right knee(s) at 8 out of 10 with activity. Patient has night pain, worsening of pain with activity and weight bearing, pain that interferes with activities of daily living, pain with passive range of motion, crepitus and joint swelling.  Patient has evidence of subchondral cysts, subchondral sclerosis, joint subluxation and joint space narrowing by imaging studies. This patient has had failure of all reasonable conservative care. There is no active infection.  Patient Active Problem List   Diagnosis Date Noted  . Osteoarthritis of right knee 03/08/2016  . Pre-operative examination for internal medicine 03/08/2016  . Lesion of nose 03/08/2016  . DVT (deep venous thrombosis) ([place]) 11/09/2015  . Primary osteoarthritis of left knee 10/09/2015  . Left knee pain 08/20/2015  . Generalized anxiety disorder 08/20/2015  . Routine general medical examination at a health care facility 02/11/2015  . Medicare annual wellness visit, subsequent 02/11/2015  . Long term current use of anticoagulant 06/14/2011  . Coronary artery disease   . Hypertension   . Hyperlipidemia   . Depression   . GERD (gastroesophageal reflux disease)   . Insomnia   . Sleep apnea, obstructive   . Pulmonary embolism [facility])    Past Medical History:  Diagnosis  Date  . Anemia    "@ birth"  . Anxiety   . Blood transfusion    "@ birth"  . Cancer ([place])    Head - skin cancer  . Constipation due to pain medication   . Coronary artery disease    DES LAD 2005  . Depression   . Full dentures   . GERD (gastroesophageal reflux disease)   . 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 Heart murmur   . Hyperlipidemia   . Hypertension   . Insomnia   . Osteoarthritis   . PONV (postoperative nausea and vomiting)    2006 - knee surgery  . Psoriasis   . PTSD (post-traumatic stress disorder)   . Pulmonary embolism ([place]) ~ 03/2006   bilaterally  . Seasonal allergies   . Shortness of breath 06/08/11   "w/exertion; that's why I'm here"  . Sleep apnea, obstructive   . Umbilical hernia     Past Surgical History:  Procedure Laterality Date  . CARDIAC CATHETERIZATION    . carpel tunnel release Bilateral ~ 06/2005/~ 08/2005   right/left  . COLONOSCOPY W/ BIOPSIES AND POLYPECTOMY    . CORONARY ANGIOPLASTY    . CORONARY STENT PLACEMENT  02/13/2004   1  . ENUCLEATION Right    age 85  . EYE SURGERY     age 43; "ruptured  right pupil"  . EYE SURGERY     age 83  . EYE SURGERY  1957   placed artificial right eye  . INGUINAL HERNIA REPAIR Right 1983  . INGUINAL HERNIA REPAIR Left 1985  . KNEE ARTHROSCOPY  ~ 06/2005  right  . MULTIPLE TOOTH EXTRACTIONS    . spinal injections  2006-2007   "maybe 10 injections for herniated discs"  . TONSILLECTOMY     "when I was real young"  . TOTAL KNEE ARTHROPLASTY Left 10/09/2015   Procedure: TOTAL KNEE ARTHROPLASTY;  Surgeon: Dorna Leitz, MD;  Location: [facility];  Service: Orthopedics;  Laterality: Left;    No prescriptions prior to admission.   Allergies  Allergen Reactions  . Wellbutrin [Bupropion] Other (See Comments)    Hallucinations, sweating  . Amoxicillin Palpitations    Social History  Substance Use Topics  . Smoking status: Former Smoker    Packs/day: 1.50    Years: 25.00     Types: Cigarettes    Quit date: 07/10/1995  . Smokeless tobacco: Never Used  . Alcohol use No    Family History  Problem Relation Age of Onset  . Asthma Father   . Emphysema Father   . Breast cancer Sister   . Liver cancer Sister   . Asthma Sister   . Asthma Brother      ROS ROS: I have reviewed the patient's review of systems thoroughly and there are no positive responses as relates to the HPI. Objective:  Physical Exam  Vital signs in last 24 hours:   Well-developed well-nourished patient in no acute distress. Alert and oriented x3 HEENT:within normal limits Cardiac: Regular rate and rhythm Pulmonary: Lungs clear to auscultation Abdomen: Soft and nontender.  Normal active bowel sounds  Musculoskeletal: (right knee: Limited range of motion.  Tender to palpation of the medial joint line.  Trace effusion.  No instability.  Patellofemoral crepitus and grinding.) Labs: Recent Results (from the past 2160 hour(s))  CBC     Status: Abnormal   Collection Time: 03/08/16  3:51 PM  Result Value Ref Range   WBC 6.1 4.0 - 10.5 K/uL   RBC 4.41 4.22 - 5.81 Mil/uL   Platelets 250.0 150.0 - 400.0 K/uL   Hemoglobin 12.3 (L) 13.0 - 17.0 g/dL   HCT 36.2 (L) 39.0 - 52.0 %   MCV 82.2 78.0 - 100.0 fl   MCHC 34.0 30.0 - 36.0 g/dL   RDW 15.3 11.5 - 15.5 %  Hemoglobin A1c     Status: None   Collection Time: 03/08/16  3:51 PM  Result Value Ref Range   Hgb A1c MFr Bld 6.4 4.6 - 6.5 %    Comment: Glycemic Control Guidelines for People with Diabetes:Non Diabetic:  <6%Goal of Therapy: <7%Additional Action Suggested:  >8%   Comprehensive metabolic panel     Status: Abnormal   Collection Time: 03/08/16  3:51 PM  Result Value Ref Range   Sodium 138 135 - 145 mEq/L   Potassium 4.2 3.5 - 5.1 mEq/L   Chloride 103 96 - 112 mEq/L   CO2 28 19 - 32 mEq/L   Glucose, Bld 105 (H) 70 - 99 mg/dL   BUN 18 6 - 23 mg/dL   Creatinine, Ser 1.07 0.40 - 1.50 mg/dL   Total Bilirubin 0.3 0.2 - 1.2 mg/dL    Alkaline Phosphatase 68 39 - 117 U/L   AST 16 0 - 37 U/L   ALT 12 0 - 53 U/L   Total Protein 7.2 6.0 - 8.3 g/dL   Albumin 4.2 3.5 - 5.2 g/dL   Calcium 9.1 8.4 - 10.5 mg/dL   GFR 72.56 >60.00 mL/min  PTT     Status: None   Collection Time: 03/08/16  3:51 PM  Result Value  Ref Range   aPTT 29.3 23.4 - 32.7 SEC  INR/PT     Status: Abnormal   Collection Time: 03/08/16  3:51 PM  Result Value Ref Range   INR 1.2 (H) 0.8 - 1.0 ratio   Prothrombin Time 12.1 9.6 - 13.1 sec  Hepatitis C antibody     Status: None   Collection Time: 03/08/16  3:51 PM  Result Value Ref Range   HCV Ab NEGATIVE NEGATIVE  Type and screen Order type and screen if day of surgery is less than 15 days from draw of preadmission visit or order morning of surgery if day of surgery is greater than 6 days from preadmission visit.     Status: None   Collection Time: 03/25/16  3:03 PM  Result Value Ref Range   ABO/RH(D) O POS    Antibody Screen NEG    Sample Expiration 04/08/2016    Extend sample reason NO TRANSFUSIONS OR PREGNANCY IN THE PAST 3 MONTHS   Surgical pcr screen     Status: None   Collection Time: 03/25/16  3:04 PM  Result Value Ref Range   MRSA, PCR NEGATIVE NEGATIVE   Staphylococcus aureus NEGATIVE NEGATIVE    Comment:        The Xpert SA Assay (FDA approved for NASAL specimens in patients over 21 years of age), is one component of a comprehensive surveillance program.  Test performance has been validated by [facility] for patients greater than or equal to 1 year old. It is not intended to diagnose infection nor to guide or monitor treatment.   APTT     Status: None   Collection Time: 03/25/16  3:04 PM  Result Value Ref Range   aPTT 32 24 - 36 seconds  CBC WITH DIFFERENTIAL     Status: Abnormal   Collection Time: 03/25/16  3:04 PM  Result Value Ref Range   WBC 5.7 4.0 - 10.5 K/uL   RBC 4.57 4.22 - 5.81 MIL/uL   Hemoglobin 12.6 (L) 13.0 - 17.0 g/dL   HCT 38.3 (L) 39.0 - 52.0 %   MCV 83.8  78.0 - 100.0 fL   MCH 27.6 26.0 - 34.0 pg   MCHC 32.9 30.0 - 36.0 g/dL   RDW 14.2 11.5 - 15.5 %   Platelets 232 150 - 400 K/uL   Neutrophils Relative % 59 %   Neutro Abs 3.3 1.7 - 7.7 K/uL   Lymphocytes Relative 27 %   Lymphs Abs 1.5 0.7 - 4.0 K/uL   Monocytes Relative 9 %   Monocytes Absolute 0.5 0.1 - 1.0 K/uL   Eosinophils Relative 4 %   Eosinophils Absolute 0.2 0.0 - 0.7 K/uL   Basophils Relative 1 %   Basophils Absolute 0.1 0.0 - 0.1 K/uL  Comprehensive metabolic panel     Status: Abnormal   Collection Time: 03/25/16  3:04 PM  Result Value Ref Range   Sodium 140 135 - 145 mmol/L   Potassium 3.9 3.5 - 5.1 mmol/L   Chloride 106 101 - 111 mmol/L   CO2 25 22 - 32 mmol/L   Glucose, Bld 121 (H) 65 - 99 mg/dL   BUN 15 6 - 20 mg/dL   Creatinine, Ser 1.07 0.61 - 1.24 mg/dL   Calcium 9.5 8.9 - 10.3 mg/dL   Total Protein 7.1 6.5 - 8.1 g/dL   Albumin 4.2 3.5 - 5.0 g/dL   AST 21 15 - 41 U/L   ALT 15 (L) 17 - 63 U/L     Alkaline Phosphatase 58 38 - 126 U/L   Total Bilirubin 0.6 0.3 - 1.2 mg/dL   GFR calc non Af Amer >60 >60 mL/min   GFR calc Af Amer >60 >60 mL/min    Comment: (NOTE) The eGFR has been calculated using the CKD EPI equation. This calculation has not been validated in all clinical situations. eGFR's persistently <60 mL/min signify possible Chronic Kidney Disease.    Anion gap 9 5 - 15  Protime-INR     Status: None   Collection Time: 03/25/16  3:04 PM  Result Value Ref Range   Prothrombin Time 14.4 11.4 - 15.2 seconds   INR 1.11   Urinalysis, Routine w reflex microscopic (not at [facility])     Status: Abnormal   Collection Time: 03/25/16  3:10 PM  Result Value Ref Range   Color, Urine YELLOW YELLOW   APPearance CLEAR CLEAR   Specific Gravity, Urine 1.013 1.005 - 1.030   pH 7.0 5.0 - 8.0   Glucose, UA NEGATIVE NEGATIVE mg/dL   Hgb urine dipstick NEGATIVE NEGATIVE   Bilirubin Urine NEGATIVE NEGATIVE   Ketones, ur NEGATIVE NEGATIVE mg/dL   Protein, ur 30 (A) NEGATIVE  mg/dL   Nitrite NEGATIVE NEGATIVE   Leukocytes, UA NEGATIVE NEGATIVE  Urine microscopic-add on     Status: Abnormal   Collection Time: 03/25/16  3:10 PM  Result Value Ref Range   Squamous Epithelial / LPF 0-5 (A) NONE SEEN   WBC, UA NONE SEEN 0 - 5 WBC/hpf   RBC / HPF 0-5 0 - 5 RBC/hpf   Bacteria, UA NONE SEEN NONE SEEN    Estimated body mass index is 41.15 kg/m as calculated from the following:   Height as of 03/25/16: 5' 5" (1.651 m).   Weight as of 03/25/16: 112.2 kg (247 lb 5 oz).   Imaging Review Plain radiographs demonstrate severe degenerative joint disease of the right knee(s). The overall alignment ismild varus. The bone quality appears to be fair for age and reported activity level.  Assessment/Plan:  End stage arthritis, right knee   The patient history, physical examination, clinical judgment of the provider and imaging studies are consistent with end stage degenerative joint disease of the right knee(s) and total knee arthroplasty is deemed medically necessary. The treatment options including medical management, injection therapy arthroscopy and arthroplasty were discussed at length. The risks and benefits of total knee arthroplasty were presented and reviewed. The risks due to aseptic loosening, infection, stiffness, patella tracking problems, thromboembolic complications and other imponderables were discussed. The patient acknowledged the explanation, agreed to proceed with the plan and consent was signed. Patient is being admitted for inpatient treatment for surgery, pain control, PT, OT, prophylactic antibiotics, VTE prophylaxis, progressive ambulation and ADL's and discharge planning. The patient is planning to be discharged home with home health services  

## 2016-04-04 ENCOUNTER — Encounter (HOSPITAL_COMMUNITY)
Admission: RE | Disposition: A | Discharge status: Home / Self Care | Payer: Self-pay | Source: Ambulatory Visit | Attending: Orthopedic Surgery

## 2016-04-04 ENCOUNTER — Inpatient Hospital Stay (HOSPITAL_COMMUNITY)
Admission: RE | Admit: 2016-04-04 | Discharge: 2016-04-06 | DRG: 470 | Disposition: A | Discharge status: Home / Self Care | Payer: Medicare Other | Source: Ambulatory Visit | Attending: Orthopedic Surgery | Admitting: Orthopedic Surgery

## 2016-04-04 ENCOUNTER — Inpatient Hospital Stay (HOSPITAL_COMMUNITY): Payer: Medicare Other | Admitting: Certified Registered Nurse Anesthetist

## 2016-04-04 ENCOUNTER — Encounter (HOSPITAL_COMMUNITY): Payer: Self-pay | Admitting: *Deleted

## 2016-04-04 ENCOUNTER — Inpatient Hospital Stay (HOSPITAL_COMMUNITY): Payer: Medicare Other | Admitting: Emergency Medicine

## 2016-04-04 DIAGNOSIS — F411 Generalized anxiety disorder: Secondary | ICD-10-CM | POA: Diagnosis present

## 2016-04-04 DIAGNOSIS — K219 Gastro-esophageal reflux disease without esophagitis: Secondary | ICD-10-CM | POA: Diagnosis present

## 2016-04-04 DIAGNOSIS — Z86711 Personal history of pulmonary embolism: Secondary | ICD-10-CM | POA: Diagnosis not present

## 2016-04-04 DIAGNOSIS — Z803 Family history of malignant neoplasm of breast: Secondary | ICD-10-CM

## 2016-04-04 DIAGNOSIS — Z800 Family history of malignant neoplasm of digestive organs: Secondary | ICD-10-CM | POA: Diagnosis not present

## 2016-04-04 DIAGNOSIS — Z87891 Personal history of nicotine dependence: Secondary | ICD-10-CM

## 2016-04-04 DIAGNOSIS — G4733 Obstructive sleep apnea (adult) (pediatric): Secondary | ICD-10-CM | POA: Diagnosis present

## 2016-04-04 DIAGNOSIS — M25561 Pain in right knee: Secondary | ICD-10-CM | POA: Diagnosis present

## 2016-04-04 DIAGNOSIS — Z7901 Long term (current) use of anticoagulants: Secondary | ICD-10-CM | POA: Diagnosis not present

## 2016-04-04 DIAGNOSIS — Z825 Family history of asthma and other chronic lower respiratory diseases: Secondary | ICD-10-CM | POA: Diagnosis not present

## 2016-04-04 DIAGNOSIS — E785 Hyperlipidemia, unspecified: Secondary | ICD-10-CM | POA: Diagnosis present

## 2016-04-04 DIAGNOSIS — Z955 Presence of coronary angioplasty implant and graft: Secondary | ICD-10-CM | POA: Diagnosis not present

## 2016-04-04 DIAGNOSIS — Z85828 Personal history of other malignant neoplasm of skin: Secondary | ICD-10-CM | POA: Diagnosis not present

## 2016-04-04 DIAGNOSIS — I2510 Atherosclerotic heart disease of native coronary artery without angina pectoris: Secondary | ICD-10-CM | POA: Diagnosis present

## 2016-04-04 DIAGNOSIS — M1711 Unilateral primary osteoarthritis, right knee: Secondary | ICD-10-CM | POA: Diagnosis present

## 2016-04-04 DIAGNOSIS — I10 Essential (primary) hypertension: Secondary | ICD-10-CM | POA: Diagnosis present

## 2016-04-04 DIAGNOSIS — F4310 Post-traumatic stress disorder, unspecified: Secondary | ICD-10-CM | POA: Diagnosis present

## 2016-04-04 HISTORY — DX: Prediabetes: R73.03

## 2016-04-04 HISTORY — PX: TOTAL KNEE ARTHROPLASTY: SHX125

## 2016-04-04 LAB — GLUCOSE, CAPILLARY
GLUCOSE-CAPILLARY: 79 mg/dL (ref 65–99)
Glucose-Capillary: 105 mg/dL — ABNORMAL HIGH (ref 65–99)

## 2016-04-04 SURGERY — ARTHROPLASTY, KNEE, TOTAL
Anesthesia: Spinal | Site: Knee | Laterality: Right

## 2016-04-04 MED ORDER — FENTANYL CITRATE (PF) 100 MCG/2ML IJ SOLN
INTRAMUSCULAR | Status: AC
  Filled 2016-04-04: qty 2

## 2016-04-04 MED ORDER — BUPIVACAINE HCL (PF) 0.5 % IJ SOLN
INTRAMUSCULAR | Status: DC | PRN
  Administered 2016-04-04: 20 mL

## 2016-04-04 MED ORDER — LACTATED RINGERS IV SOLN
INTRAVENOUS | Status: DC
  Administered 2016-04-04 (×3): via INTRAVENOUS

## 2016-04-04 MED ORDER — OXYCODONE HCL 5 MG PO TABS
5.0000 mg | ORAL_TABLET | ORAL | Status: DC | PRN
  Administered 2016-04-04 – 2016-04-05 (×5): 10 mg via ORAL
  Administered 2016-04-06 (×2): 5 mg via ORAL
  Administered 2016-04-06: 10 mg via ORAL
  Filled 2016-04-04 (×7): qty 2

## 2016-04-04 MED ORDER — BUPIVACAINE IN DEXTROSE 0.75-8.25 % IT SOLN
INTRATHECAL | Status: DC | PRN
  Administered 2016-04-04: 15 mg via INTRATHECAL

## 2016-04-04 MED ORDER — MEPERIDINE HCL 25 MG/ML IJ SOLN: 6.2500 mg | INTRAMUSCULAR | Status: DC | PRN

## 2016-04-04 MED ORDER — ALUM & MAG HYDROXIDE-SIMETH 200-200-20 MG/5ML PO SUSP: 30.0000 mL | ORAL | Status: DC | PRN

## 2016-04-04 MED ORDER — HYDROCHLOROTHIAZIDE 25 MG PO TABS
25.0000 mg | ORAL_TABLET | Freq: Every day | ORAL | Status: DC
  Administered 2016-04-05 – 2016-04-06 (×2): 25 mg via ORAL
  Filled 2016-04-04 (×2): qty 1

## 2016-04-04 MED ORDER — GABAPENTIN 300 MG PO CAPS
300.0000 mg | ORAL_CAPSULE | Freq: Two times a day (BID) | ORAL | Status: DC
  Administered 2016-04-04 – 2016-04-06 (×4): 300 mg via ORAL
  Filled 2016-04-04 (×4): qty 1

## 2016-04-04 MED ORDER — DIPHENHYDRAMINE HCL 12.5 MG/5ML PO ELIX: 12.5000 mg | ORAL_SOLUTION | ORAL | Status: DC | PRN

## 2016-04-04 MED ORDER — ONDANSETRON HCL 4 MG/2ML IJ SOLN
INTRAMUSCULAR | Status: DC | PRN
  Administered 2016-04-04: 4 mg via INTRAVENOUS

## 2016-04-04 MED ORDER — HYDROMORPHONE HCL 1 MG/ML IJ SOLN
INTRAMUSCULAR | Status: AC
  Administered 2016-04-05: 1 mg via INTRAVENOUS
  Filled 2016-04-04: qty 1

## 2016-04-04 MED ORDER — FENTANYL CITRATE (PF) 100 MCG/2ML IJ SOLN
100.0000 ug | Freq: Once | INTRAMUSCULAR | Status: AC
  Administered 2016-04-04: 100 ug via INTRAVENOUS

## 2016-04-04 MED ORDER — CLINDAMYCIN PHOSPHATE 600 MG/50ML IV SOLN
600.0000 mg | Freq: Four times a day (QID) | INTRAVENOUS | Status: AC
  Administered 2016-04-04 – 2016-04-05 (×2): 600 mg via INTRAVENOUS
  Filled 2016-04-04 (×2): qty 50

## 2016-04-04 MED ORDER — BUPIVACAINE LIPOSOME 1.3 % IJ SUSP
20.0000 mL | INTRAMUSCULAR | Status: DC
  Filled 2016-04-04: qty 20

## 2016-04-04 MED ORDER — BUPIVACAINE HCL (PF) 0.5 % IJ SOLN
INTRAMUSCULAR | Status: AC
  Filled 2016-04-04: qty 30

## 2016-04-04 MED ORDER — DEXAMETHASONE SODIUM PHOSPHATE 10 MG/ML IJ SOLN
10.0000 mg | Freq: Two times a day (BID) | INTRAMUSCULAR | Status: AC
  Administered 2016-04-04 – 2016-04-05 (×2): 10 mg via INTRAVENOUS
  Filled 2016-04-04 (×2): qty 1

## 2016-04-04 MED ORDER — LOSARTAN POTASSIUM 50 MG PO TABS
100.0000 mg | ORAL_TABLET | Freq: Every day | ORAL | Status: DC
  Administered 2016-04-05 – 2016-04-06 (×2): 100 mg via ORAL
  Filled 2016-04-04 (×2): qty 2

## 2016-04-04 MED ORDER — ONDANSETRON HCL 4 MG/2ML IJ SOLN: 4.0000 mg | Freq: Four times a day (QID) | INTRAMUSCULAR | Status: DC | PRN

## 2016-04-04 MED ORDER — HYDROMORPHONE HCL 1 MG/ML IJ SOLN
0.5000 mg | INTRAMUSCULAR | Status: DC | PRN
  Administered 2016-04-04 – 2016-04-05 (×3): 1 mg via INTRAVENOUS
  Filled 2016-04-04 (×3): qty 1

## 2016-04-04 MED ORDER — POLYETHYLENE GLYCOL 3350 17 G PO PACK
17.0000 g | PACK | Freq: Every day | ORAL | Status: DC | PRN
  Administered 2016-04-04: 17 g via ORAL
  Filled 2016-04-04: qty 1

## 2016-04-04 MED ORDER — HYDROMORPHONE HCL 1 MG/ML IJ SOLN
0.2500 mg | INTRAMUSCULAR | Status: DC | PRN
  Administered 2016-04-04 (×4): 0.5 mg via INTRAVENOUS

## 2016-04-04 MED ORDER — CHLORHEXIDINE GLUCONATE 4 % EX LIQD: 60.0000 mL | Freq: Once | CUTANEOUS | Status: DC

## 2016-04-04 MED ORDER — SODIUM CHLORIDE 0.9 % IJ SOLN
INTRAMUSCULAR | Status: DC | PRN
  Administered 2016-04-04: 20 mL

## 2016-04-04 MED ORDER — PROMETHAZINE HCL 25 MG/ML IJ SOLN: 6.2500 mg | INTRAMUSCULAR | Status: DC | PRN

## 2016-04-04 MED ORDER — TIZANIDINE HCL 2 MG PO TABS: 2.0000 mg | ORAL_TABLET | Freq: Three times a day (TID) | ORAL | 0 refills | Status: DC | PRN

## 2016-04-04 MED ORDER — POLYETHYLENE GLYCOL 3350 17 GM/SCOOP PO POWD
17.0000 g | Freq: Every day | ORAL | Status: DC
  Filled 2016-04-04: qty 255

## 2016-04-04 MED ORDER — ONDANSETRON HCL 4 MG PO TABS: 4.0000 mg | ORAL_TABLET | Freq: Four times a day (QID) | ORAL | Status: DC | PRN

## 2016-04-04 MED ORDER — PANTOPRAZOLE SODIUM 40 MG PO TBEC
40.0000 mg | DELAYED_RELEASE_TABLET | Freq: Every day | ORAL | Status: DC
  Administered 2016-04-05 – 2016-04-06 (×2): 40 mg via ORAL
  Filled 2016-04-04 (×2): qty 1

## 2016-04-04 MED ORDER — ATORVASTATIN CALCIUM 40 MG PO TABS
40.0000 mg | ORAL_TABLET | Freq: Every day | ORAL | Status: DC
  Administered 2016-04-04 – 2016-04-06 (×3): 40 mg via ORAL
  Filled 2016-04-04 (×3): qty 1

## 2016-04-04 MED ORDER — SODIUM CHLORIDE 0.9 % IV SOLN
INTRAVENOUS | Status: DC
  Administered 2016-04-05: 02:00:00 via INTRAVENOUS

## 2016-04-04 MED ORDER — CELECOXIB 200 MG PO CAPS
200.0000 mg | ORAL_CAPSULE | Freq: Two times a day (BID) | ORAL | Status: DC
Start: 2016-04-04 — End: 2016-04-06
  Administered 2016-04-04 – 2016-04-06 (×4): 200 mg via ORAL
  Filled 2016-04-04 (×4): qty 1

## 2016-04-04 MED ORDER — BISACODYL 5 MG PO TBEC: 5.0000 mg | DELAYED_RELEASE_TABLET | Freq: Every day | ORAL | Status: DC | PRN

## 2016-04-04 MED ORDER — ALPRAZOLAM 0.5 MG PO TABS
0.5000 mg | ORAL_TABLET | Freq: Three times a day (TID) | ORAL | Status: DC | PRN
  Administered 2016-04-04 – 2016-04-05 (×2): 0.5 mg via ORAL
  Filled 2016-04-04 (×2): qty 1

## 2016-04-04 MED ORDER — OXYCODONE-ACETAMINOPHEN 5-325 MG PO TABS: 1.0000 | ORAL_TABLET | ORAL | 0 refills | Status: DC | PRN

## 2016-04-04 MED ORDER — PROPOFOL 500 MG/50ML IV EMUL
INTRAVENOUS | Status: DC | PRN
  Administered 2016-04-04: 100 ug/kg/min via INTRAVENOUS

## 2016-04-04 MED ORDER — 0.9 % SODIUM CHLORIDE (POUR BTL) OPTIME
TOPICAL | Status: DC | PRN
  Administered 2016-04-04: 4000 mL

## 2016-04-04 MED ORDER — FENOFIBRATE 160 MG PO TABS
160.0000 mg | ORAL_TABLET | Freq: Every day | ORAL | Status: DC
  Administered 2016-04-05 – 2016-04-06 (×2): 160 mg via ORAL
  Filled 2016-04-04 (×2): qty 1

## 2016-04-04 MED ORDER — METHOCARBAMOL 500 MG PO TABS: 500.0000 mg | ORAL_TABLET | Freq: Four times a day (QID) | ORAL | Status: DC | PRN

## 2016-04-04 MED ORDER — MIDAZOLAM HCL 2 MG/2ML IJ SOLN
INTRAMUSCULAR | Status: AC
  Filled 2016-04-04: qty 2

## 2016-04-04 MED ORDER — MIDAZOLAM HCL 2 MG/2ML IJ SOLN
1.0000 mg | Freq: Once | INTRAMUSCULAR | Status: AC
  Administered 2016-04-04: 1 mg via INTRAVENOUS

## 2016-04-04 MED ORDER — POLYETHYLENE GLYCOL 3350 17 G PO PACK
17.0000 g | PACK | Freq: Every day | ORAL | Status: DC
  Administered 2016-04-04 – 2016-04-05 (×2): 17 g via ORAL
  Filled 2016-04-04 (×2): qty 1

## 2016-04-04 MED ORDER — VENLAFAXINE HCL ER 37.5 MG PO CP24
37.5000 mg | ORAL_CAPSULE | Freq: Every day | ORAL | Status: DC
Start: 2016-04-05 — End: 2016-04-06
  Administered 2016-04-05 – 2016-04-06 (×2): 37.5 mg via ORAL
  Filled 2016-04-04 (×2): qty 1

## 2016-04-04 MED ORDER — BUPIVACAINE LIPOSOME 1.3 % IJ SUSP
INTRAMUSCULAR | Status: DC | PRN
  Administered 2016-04-04: 20 mL

## 2016-04-04 MED ORDER — ACETAMINOPHEN 650 MG RE SUPP: 650.0000 mg | Freq: Four times a day (QID) | RECTAL | Status: DC | PRN

## 2016-04-04 MED ORDER — TRANEXAMIC ACID 1000 MG/10ML IV SOLN
1000.0000 mg | INTRAVENOUS | Status: AC
  Administered 2016-04-04: 1000 mg via INTRAVENOUS
  Filled 2016-04-04: qty 10

## 2016-04-04 MED ORDER — ROPIVACAINE HCL 7.5 MG/ML IJ SOLN
INTRAMUSCULAR | Status: DC | PRN
  Administered 2016-04-04: 20 mL via PERINEURAL

## 2016-04-04 MED ORDER — MAGNESIUM CITRATE PO SOLN: 1.0000 | Freq: Once | ORAL | Status: DC | PRN

## 2016-04-04 MED ORDER — LOSARTAN POTASSIUM-HCTZ 100-25 MG PO TABS: 1.0000 | ORAL_TABLET | Freq: Every day | ORAL | Status: DC

## 2016-04-04 MED ORDER — RIVAROXABAN 20 MG PO TABS
20.0000 mg | ORAL_TABLET | Freq: Every day | ORAL | Status: DC
  Administered 2016-04-05 – 2016-04-06 (×2): 20 mg via ORAL
  Filled 2016-04-04 (×2): qty 1

## 2016-04-04 MED ORDER — METHOCARBAMOL 1000 MG/10ML IJ SOLN
500.0000 mg | Freq: Four times a day (QID) | INTRAVENOUS | Status: DC | PRN
  Filled 2016-04-04: qty 5

## 2016-04-04 MED ORDER — HYDROMORPHONE HCL 1 MG/ML IJ SOLN
INTRAMUSCULAR | Status: AC
  Filled 2016-04-04: qty 1

## 2016-04-04 MED ORDER — ACETAMINOPHEN 325 MG PO TABS: 650.0000 mg | ORAL_TABLET | Freq: Four times a day (QID) | ORAL | Status: DC | PRN

## 2016-04-04 SURGICAL SUPPLY — 62 items
BANDAGE ESMARK 6X9 LF (GAUZE/BANDAGES/DRESSINGS) ×1 IMPLANT
BENZOIN TINCTURE PRP APPL 2/3 (GAUZE/BANDAGES/DRESSINGS) ×2 IMPLANT
BLADE SAGITTAL 25.0X1.19X90 (BLADE) ×2 IMPLANT
BLADE SAW SAG 90X13X1.27 (BLADE) ×2 IMPLANT
BNDG ESMARK 6X9 LF (GAUZE/BANDAGES/DRESSINGS) ×2
BOWL SMART MIX CTS (DISPOSABLE) ×2 IMPLANT
CAPT KNEE TOTAL 3 ATTUNE ×2 IMPLANT
CEMENT HV SMART SET (Cement) ×4 IMPLANT
CLSR STERI-STRIP ANTIMIC 1/2X4 (GAUZE/BANDAGES/DRESSINGS) ×2 IMPLANT
COVER SURGICAL LIGHT HANDLE (MISCELLANEOUS) ×2 IMPLANT
CUFF TOURNIQUET SINGLE 34IN LL (TOURNIQUET CUFF) ×2 IMPLANT
CUFF TOURNIQUET SINGLE 44IN (TOURNIQUET CUFF) IMPLANT
DRAPE EXTREMITY T 121X128X90 (DRAPE) ×2 IMPLANT
DRAPE IMP U-DRAPE 54X76 (DRAPES) ×2 IMPLANT
DRAPE U-SHAPE 47X51 STRL (DRAPES) ×2 IMPLANT
DRSG AQUACEL AG ADV 3.5X10 (GAUZE/BANDAGES/DRESSINGS) ×2 IMPLANT
DRSG MEPILEX BORDER 4X12 (GAUZE/BANDAGES/DRESSINGS) ×2 IMPLANT
DRSG PAD ABDOMINAL 8X10 ST (GAUZE/BANDAGES/DRESSINGS) ×2 IMPLANT
DURAPREP 26ML APPLICATOR (WOUND CARE) ×2 IMPLANT
ELECT REM PT RETURN 9FT ADLT (ELECTROSURGICAL) ×2
ELECTRODE REM PT RTRN 9FT ADLT (ELECTROSURGICAL) ×1 IMPLANT
EVACUATOR 1/8 PVC DRAIN (DRAIN) ×2 IMPLANT
FACESHIELD WRAPAROUND (MASK) ×2 IMPLANT
GAUZE SPONGE 4X4 12PLY STRL (GAUZE/BANDAGES/DRESSINGS) ×2 IMPLANT
GLOVE BIOGEL PI IND STRL 8 (GLOVE) ×2 IMPLANT
GLOVE BIOGEL PI INDICATOR 8 (GLOVE) ×2
GLOVE ECLIPSE 7.5 STRL STRAW (GLOVE) ×4 IMPLANT
GOWN STRL REUS W/ TWL LRG LVL3 (GOWN DISPOSABLE) ×1 IMPLANT
GOWN STRL REUS W/ TWL XL LVL3 (GOWN DISPOSABLE) ×2 IMPLANT
GOWN STRL REUS W/TWL LRG LVL3 (GOWN DISPOSABLE) ×1
GOWN STRL REUS W/TWL XL LVL3 (GOWN DISPOSABLE) ×2
HANDPIECE INTERPULSE COAX TIP (DISPOSABLE) ×1
HOOD PEEL AWAY FACE SHEILD DIS (HOOD) ×6 IMPLANT
IMMOBILIZER KNEE 20 (SOFTGOODS) IMPLANT
IMMOBILIZER KNEE 22 UNIV (SOFTGOODS) ×2 IMPLANT
KIT BASIN OR (CUSTOM PROCEDURE TRAY) ×2 IMPLANT
KIT ROOM TURNOVER OR (KITS) ×2 IMPLANT
MANIFOLD NEPTUNE II (INSTRUMENTS) ×2 IMPLANT
NEEDLE SPNL 22GX3.5 QUINCKE BK (NEEDLE) ×2 IMPLANT
NS IRRIG 1000ML POUR BTL (IV SOLUTION) ×2 IMPLANT
PACK TOTAL JOINT (CUSTOM PROCEDURE TRAY) ×2 IMPLANT
PACK UNIVERSAL I (CUSTOM PROCEDURE TRAY) ×2 IMPLANT
PAD ARMBOARD 7.5X6 YLW CONV (MISCELLANEOUS) ×4 IMPLANT
PAD CAST 4YDX4 CTTN HI CHSV (CAST SUPPLIES) ×1 IMPLANT
PADDING CAST COTTON 4X4 STRL (CAST SUPPLIES) ×1
PADDING CAST COTTON 6X4 STRL (CAST SUPPLIES) ×2 IMPLANT
SET HNDPC FAN SPRY TIP SCT (DISPOSABLE) ×1 IMPLANT
SPONGE GAUZE 4X4 12PLY STER LF (GAUZE/BANDAGES/DRESSINGS) ×2 IMPLANT
STAPLER VISISTAT 35W (STAPLE) IMPLANT
STRIP CLOSURE SKIN 1/2X4 (GAUZE/BANDAGES/DRESSINGS) ×4 IMPLANT
SUCTION FRAZIER HANDLE 10FR (MISCELLANEOUS) ×1
SUCTION TUBE FRAZIER 10FR DISP (MISCELLANEOUS) ×1 IMPLANT
SUT MNCRL AB 3-0 PS2 18 (SUTURE) IMPLANT
SUT VIC AB 0 CTB1 27 (SUTURE) ×4 IMPLANT
SUT VIC AB 1 CT1 27 (SUTURE) ×2
SUT VIC AB 1 CT1 27XBRD ANBCTR (SUTURE) ×2 IMPLANT
SUT VIC AB 2-0 CTB1 (SUTURE) ×4 IMPLANT
SYR 50ML LL SCALE MARK (SYRINGE) ×2 IMPLANT
TOWEL OR 17X24 6PK STRL BLUE (TOWEL DISPOSABLE) ×2 IMPLANT
TOWEL OR 17X26 10 PK STRL BLUE (TOWEL DISPOSABLE) ×2 IMPLANT
TRAY FOLEY CATH 16FRSI W/METER (SET/KITS/TRAYS/PACK) IMPLANT
WRAP KNEE MAXI GEL POST OP (GAUZE/BANDAGES/DRESSINGS) ×2 IMPLANT

## 2016-04-04 NOTE — Brief Op Note (Signed)
04/04/2016  2:59 PM  PATIENT:  Aaron [name]  70 y.o. male  PRE-OPERATIVE DIAGNOSIS:  OSTEOARTHRITIS RIGHT KNEE  POST-OPERATIVE DIAGNOSIS:  right knee osteoarthritis  PROCEDURE:  Procedure(s): TOTAL KNEE ARTHROPLASTY (Right)  SURGEON:  Surgeon(s) and Role:    * Dorna Leitz, MD - Primary  PHYSICIAN ASSISTANT:   ASSISTANTS: bethune   ANESTHESIA:   spinal  EBL:  Total I/O In: 1000 [I.V.:1000] Out: -   BLOOD ADMINISTERED:none  DRAINS: none   LOCAL MEDICATIONS USED:  MARCAINE    and OTHER experel  SPECIMEN:  No Specimen  DISPOSITION OF SPECIMEN:  N/A  COUNTS:  YES  TOURNIQUET:  * Missing tourniquet times found for documented tourniquets in log:  [id number] *  DICTATION: .Other Dictation: Dictation Number none given  PLAN OF CARE: Admit to inpatient   PATIENT DISPOSITION:  PACU - hemodynamically stable.   Delay start of Pharmacological VTE agent (>24hrs) due to surgical blood loss or risk of bleeding: no

## 2016-04-04 NOTE — Anesthesia Procedure Notes (Signed)
Spinal  Patient location during procedure: OR End time: 04/04/2016 1:24 PM Staffing Anesthesiologist: Annye Asa Performed: anesthesiologist  Preanesthetic Checklist Completed: patient identified, site marked, surgical consent, pre-op evaluation, timeout performed, IV checked, risks and benefits discussed and monitors and equipment checked Spinal Block Patient position: sitting Prep: ChloraPrep and site prepped and draped Patient monitoring: blood pressure, continuous pulse ox, cardiac monitor and heart rate Approach: midline Location: L3-4 Injection technique: single-shot Needle Needle type: Quincke  Needle gauge: 25 G Needle length: 9 cm Additional Notes Pt identified in Operating room.  Monitors applied. Working IV access confirmed. Sterile prep, drape lumbar spine.  1% lido local L 3,4.  #25ga Quincke into clear CSF L 3,4.  15mg  0.75% Bupivacaine with dextrose injected with asp CSF beginning and end of injection.  Patient asymptomatic, VSS, no heme aspirated, tolerated well.  Jenita Seashore, MD

## 2016-04-04 NOTE — Discharge Instructions (Signed)

## 2016-04-04 NOTE — Anesthesia Preprocedure Evaluation (Signed)
Anesthesia Evaluation  Patient identified by MRN, date of birth, ID band Patient awake    Reviewed: Allergy & Precautions, NPO status , Patient's Chart, lab work & pertinent test results  History of Anesthesia Complications (+) PONV and history of anesthetic complications  Airway Mallampati: II  TM Distance: >3 FB Neck ROM: Full    Dental  (+) Edentulous Upper, Edentulous Lower   Pulmonary sleep apnea and Continuous Positive Airway Pressure Ventilation , former smoker, PE (h/o PE x2)   breath sounds clear to auscultation       Cardiovascular hypertension, Pt. on medications + CAD and + Cardiac Stents   Rhythm:Regular Rate:Normal  Normal Myoview June 2014   Neuro/Psych  Headaches,    GI/Hepatic Neg liver ROS, GERD  Medicated and Controlled,  Endo/Other  Morbid obesity  Renal/GU negative Renal ROS     Musculoskeletal  (+) Arthritis , Osteoarthritis,    Abdominal (+) + obese,   Peds  Hematology  (+) Blood dyscrasia (Xarelto: off since Thursday (3.5 days)), ,   Anesthesia Other Findings   Reproductive/Obstetrics                             Anesthesia Physical Anesthesia Plan  ASA: III  Anesthesia Plan: Spinal   Post-op Pain Management:  Regional for Post-op pain   Induction:   Airway Management Planned: Natural Airway and Simple Face Mask  Additional Equipment:   Intra-op Plan:   Post-operative Plan:   Informed Consent: I have reviewed the patients History and Physical, chart, labs and discussed the procedure including the risks, benefits and alternatives for the proposed anesthesia with the patient or authorized representative who has indicated his/her understanding and acceptance.   Dental advisory given  Plan Discussed with: CRNA and Surgeon  Anesthesia Plan Comments: (Plan routine monitors, SAB with adductor canal block for post op analgesia)        Anesthesia Quick  Evaluation

## 2016-04-04 NOTE — Progress Notes (Signed)
Orthopedic Tech Progress Note Patient Details:  Aaron [name] December 18, 1945 [id number]   Applied OHF w/ Trapeze. Charlott Rakes 04/04/2016, 8:37 PM

## 2016-04-04 NOTE — Transfer of Care (Signed)
Immediate Anesthesia Transfer of Care Note  Patient: Aaron [name]  Procedure(s) Performed: Procedure(s): TOTAL KNEE ARTHROPLASTY (Right)  Patient Location: PACU  Anesthesia Type:Spinal and MAC combined with regional for post-op pain  Level of Consciousness: awake, alert  and oriented  Airway & Oxygen Therapy: Patient Spontanous Breathing and Patient connected to face mask oxygen  Post-op Assessment: Report given to RN and Post -op Vital signs reviewed and stable  Post vital signs: Reviewed and stable  Last Vitals:  Vitals:   04/04/16 1253 04/04/16 1538  BP: (!) 151/62   Pulse: 70   Resp: 14   Temp:  36.4 C    Last Pain:  Vitals:   04/04/16 1101  TempSrc: Oral      Patients Stated Pain Goal: 2 ([id number])  Complications: No apparent anesthesia complications

## 2016-04-04 NOTE — Anesthesia Procedure Notes (Signed)
Anesthesia Regional Block:  Adductor canal block  Pre-Anesthetic Checklist: ,, timeout performed, Correct Patient, Correct Site, Correct Laterality, Correct Procedure, Correct Position, site marked, Risks and benefits discussed,  Surgical consent,  Pre-op evaluation,  At surgeon's request and post-op pain management  Laterality: Right and Lower  Prep: chloraprep       Needles:   Needle Type: Echogenic Needle     Needle Length: 9cm 9 cm Needle Gauge: 22 and 22 G    Additional Needles:  Procedures: ultrasound guided (picture in chart) Adductor canal block Narrative:  Start time: 04/04/2016 12:26 PM End time: 04/04/2016 12:33 PM Injection made incrementally with aspirations every 5 mL.  Performed by: Personally  Anesthesiologist: Glennon Mac, Feliberto Stockley  Additional Notes: Pt identified in Holding room.  Monitors applied. Working IV access confirmed. Sterile prep, drape R thigh.  #22ga ECHOgenic needle into adductor canal with US guidance.  20cc 0.75% Ropivacaine injected incrementally after negative test dose.  Patient asymptomatic, VSS, no heme aspirated, tolerated well.  Jenita Seashore, MD

## 2016-04-05 ENCOUNTER — Encounter (HOSPITAL_COMMUNITY): Payer: Self-pay | Admitting: Orthopedic Surgery

## 2016-04-05 LAB — BASIC METABOLIC PANEL
ANION GAP: 9 (ref 5–15)
BUN: 19 mg/dL (ref 6–20)
CALCIUM: 8.4 mg/dL — AB (ref 8.9–10.3)
CO2: 22 mmol/L (ref 22–32)
Chloride: 101 mmol/L (ref 101–111)
Creatinine, Ser: 1.11 mg/dL (ref 0.61–1.24)
GFR calc Af Amer: >60 mL/min (ref >60)
GFR calc non Af Amer: >60 mL/min (ref >60)
GLUCOSE: 230 mg/dL — AB (ref 65–99)
Potassium: 3.8 mmol/L (ref 3.5–5.1)
Sodium: 132 mmol/L — ABNORMAL LOW (ref 135–145)

## 2016-04-05 LAB — CBC
HEMATOCRIT: 30.1 % — AB (ref 39.0–52.0)
Hemoglobin: 10 g/dL — ABNORMAL LOW (ref 13.0–17.0)
MCH: 27.9 pg (ref 26.0–34.0)
MCHC: 33.2 g/dL (ref 30.0–36.0)
MCV: 83.8 fL (ref 78.0–100.0)
Platelets: 184 10*3/uL (ref 150–400)
RBC: 3.59 MIL/uL — ABNORMAL LOW (ref 4.22–5.81)
RDW: 14 % (ref 11.5–15.5)
WBC: 6.7 10*3/uL (ref 4.0–10.5)

## 2016-04-05 NOTE — Evaluation (Signed)
Physical Therapy Evaluation Patient Details Name: BUNYAN MOLE MRN: [id number] DOB: 08/18/1945 Today's Date: 04/05/2016   History of Present Illness  pt presents post R TKR.  pt with hx of L TKR, CVT, PE, Anxiety, Depression, HTN, CAD, CA, PTSD, and R Prosthetic Eye.    Clinical Impression  Pt indicates too painful for ambulation today, but was agreeable to ambulate to bathroom to void this am.  Feel pt will progress to return to home with family support.  Will continue to follow.      Follow Up Recommendations Home health PT;Supervision/Assistance - 24 hour    Equipment Recommendations  None recommended by PT    Recommendations for Other Services       Precautions / Restrictions Precautions Precautions: Fall Required Braces or Orthoses: Knee Immobilizer - Right Knee Immobilizer - Right: Discontinue once straight leg raise with < 10 degree lag (On with walking) Restrictions Weight Bearing Restrictions: Yes RLE Weight Bearing: Weight bearing as tolerated      Mobility  Bed Mobility Overal bed mobility: Needs Assistance Bed Mobility: Supine to Sit     Supine to sit: Min assist;HOB elevated     General bed mobility comments: cues for sequencing and encouragement.  A with management of R LE.    Transfers Overall transfer level: Needs assistance Equipment used: Rolling walker (2 wheeled) Transfers: Sit to/from Stand Sit to Stand: Min assist         General transfer comment: cues for UE use and positioning of LEs.    Ambulation/Gait Ambulation/Gait assistance: Min guard Ambulation Distance (Feet): 10 Feet (x2) Assistive device: Rolling walker (2 wheeled) Gait Pattern/deviations: Step-to pattern;Decreased step length - left;Decreased stance time - right     General Gait Details: cues for gait sequencing, positioning within RW, and encouragement.  pt limited ambulation distance based on pain.    Stairs            Wheelchair Mobility    Modified Rankin  (Stroke Patients Only)       Balance Overall balance assessment: Needs assistance Sitting-balance support: No upper extremity supported;Feet supported Sitting balance-Leahy Scale: Good     Standing balance support: Bilateral upper extremity supported;During functional activity Standing balance-Leahy Scale: Poor                               Pertinent Vitals/Pain Pain Assessment: 0-10 Pain Score: 6  Pain Location: R knee Pain Descriptors / Indicators: Grimacing;Guarding;Sore Pain Intervention(s): Monitored during session;Premedicated before session;Repositioned    Home Living Family/patient expects to be discharged to:: Private residence Living Arrangements: Spouse/significant other Available Help at Discharge: Family;Available 24 hours/day Type of Home: House Home Access: Stairs to enter Entrance Stairs-Rails: Right Entrance Stairs-Number of Steps: 4 Home Layout: One level Home Equipment: Walker - 2 wheels;Cane - single point;Grab bars - toilet;Bedside commode      Prior Function Level of Independence: Independent               Hand Dominance        Extremity/Trunk Assessment   Upper Extremity Assessment: Overall WFL for tasks assessed           Lower Extremity Assessment: RLE deficits/detail RLE Deficits / Details: Sensation intact, AAROM ~ 10 - 40, strength limited by pain.      Cervical / Trunk Assessment: Normal  Communication   Communication: No difficulties  Cognition Arousal/Alertness: Awake/alert Behavior During Therapy: WFL for tasks assessed/performed Overall  Cognitive Status: Within Functional Limits for tasks assessed                      General Comments      Exercises Total Joint Exercises Ankle Circles/Pumps: AROM;Both;10 reps Quad Sets: AROM;Both;10 reps Long Arc Quad: AAROM;Right;10 reps Knee Flexion: AAROM;Right;10 reps   Assessment/Plan    PT Assessment Patient needs continued PT services  PT  Problem List Decreased strength;Decreased activity tolerance;Decreased balance;Decreased range of motion;Decreased mobility;Decreased knowledge of use of DME;Obesity;Pain          PT Treatment Interventions DME instruction;Gait training;Stair training;Functional mobility training;Therapeutic activities;Therapeutic exercise;Balance training;Patient/family education    PT Goals (Current goals can be found in the Care Plan section)  Acute Rehab PT Goals Patient Stated Goal: Home PT Goal Formulation: With patient Time For Goal Achievement: 04/12/16 Potential to Achieve Goals: Good    Frequency 7X/week   Barriers to discharge        Co-evaluation               End of Session Equipment Utilized During Treatment: Gait belt Activity Tolerance: Patient limited by pain Patient left: in chair;with call bell/phone within reach;with family/visitor present Nurse Communication: Mobility status         Time: [id number] PT Time Calculation (min) (ACUTE ONLY): 35 min   Charges:   PT Evaluation $PT Eval Moderate Complexity: 1 Procedure PT Treatments $Gait Training: 8-22 mins   PT G CodesCatarina Hartshorn, [place] 04/05/2016, 10:31 AM

## 2016-04-05 NOTE — Anesthesia Postprocedure Evaluation (Signed)
Anesthesia Post Note  Patient: Aaron [name]  Procedure(s) Performed: Procedure(s) (LRB): TOTAL KNEE ARTHROPLASTY (Right)  Patient location during evaluation: PACU Anesthesia Type: Spinal, MAC and Regional Level of consciousness: awake and alert Pain management: pain level controlled Vital Signs Assessment: post-procedure vital signs reviewed and stable Respiratory status: spontaneous breathing and respiratory function stable Cardiovascular status: blood pressure returned to baseline and stable Postop Assessment: spinal receding Anesthetic complications: no    Last Vitals:  Vitals:   04/05/16 0552 04/05/16 1014  BP: 139/67 (!) 154/67  Pulse: 68 76  Resp: 17   Temp: 36.2 C     Last Pain:  Vitals:   04/05/16 0552  TempSrc: Oral  PainSc:                  [name]

## 2016-04-05 NOTE — Progress Notes (Signed)
Subjective: 1 Day Post-Op Procedure(s) (LRB): TOTAL KNEE ARTHROPLASTY (Right) Patient reports pain as moderate. Taking by mouth and voiding okay. Positive flatus.    Objective: Vital signs in last 24 hours: Temp:  [97.1 F (36.2 C)-98.5 F (36.9 C)] 97.1 F (36.2 C) (10/03 0552) Pulse Rate:  [55-76] 68 (10/03 0552) Resp:  [9-25] 17 (10/03 0552) BP: (139-191)/(62-140) 139/67 (10/03 0552) SpO2:  [97 %-100 %] 99 % (10/03 0552) Weight:  [112 kg (247 lb)] 112 kg (247 lb) (10/02 1101)  Intake/Output from previous day: 10/02 0701 - 10/03 0700 In: 1600 [I.V.:1600] Out: 320 [Urine:300; Blood:20] Intake/Output this shift: No intake/output data recorded.  No results for input(s): HGB in the last 72 hours. No results for input(s): WBC, RBC, HCT, PLT in the last 72 hours. No results for input(s): NA, K, CL, CO2, BUN, CREATININE, GLUCOSE, CALCIUM in the last 72 hours. No results for input(s): LABPT, INR in the last 72 hours. Right knee exam:  Sensation intact distally Intact pulses distally Dorsiflexion/Plantar flexion intact Incision: dressing C/D/I Compartment soft  Assessment/Plan: 1 Day Post-Op Procedure(s) (LRB): TOTAL KNEE ARTHROPLASTY (Right) Up with therapy Discharge home with home health tomorrow  ASA 325 mg twice daily for DVT prophylaxis.   [place] G 04/05/2016, 8:21 AM

## 2016-04-05 NOTE — Progress Notes (Signed)
Physical Therapy Treatment Patient Details Name: Aaron [name] MRN: [id number] DOB: [birth year] Today's Date: 04/05/2016    History of Present Illness pt presents post R TKR.  pt with hx of L TKR, CVT, PE, Anxiety, Depression, HTN, CAD, CA, PTSD, and R Prosthetic Eye.      PT Comments    Patient is progressing well toward mobility goals. Patient needs to practice stairs next session. Continue to progress as tolerated with anticipated d/c home with HHPT.     Follow Up Recommendations  Home health PT;Supervision/Assistance - 24 hour     Equipment Recommendations  None recommended by PT    Recommendations for Other Services       Precautions / Restrictions Precautions Precautions: Fall Required Braces or Orthoses: Knee Immobilizer - Right Knee Immobilizer - Right: Discontinue once straight leg raise with < 10 degree lag (On with walking) Restrictions Weight Bearing Restrictions: Yes RLE Weight Bearing: Weight bearing as tolerated    Mobility  Bed Mobility               General bed mobility comments: pt OOB in chair upon arrival  Transfers Overall transfer level: Needs assistance Equipment used: Rolling walker (2 wheeled) Transfers: Sit to/from Stand Sit to Stand: Min guard         General transfer comment: cues for hand placement  Ambulation/Gait Ambulation/Gait assistance: Supervision Ambulation Distance (Feet): 180 Feet Assistive device: Rolling walker (2 wheeled) Gait Pattern/deviations: Step-through pattern;Decreased stance time - right;Decreased step length - left;Trunk flexed     General Gait Details: cues for posture, proximity of RW, and increased R knee extension during stance phase   Stairs            Wheelchair Mobility    Modified Rankin (Stroke Patients Only)       Balance Overall balance assessment: Needs assistance   Sitting balance-Leahy Scale: Good       Standing balance-Leahy Scale: Fair Standing balance comment:  static stand                    Cognition Arousal/Alertness: Awake/alert Behavior During Therapy: WFL for tasks assessed/performed Overall Cognitive Status: Within Functional Limits for tasks assessed                      Exercises Total Joint Exercises Knee Flexion: Right;AROM;5 reps;Seated;Other (comment) (10 sec holds) Goniometric ROM: ~90    General Comments General comments (skin integrity, edema, etc.): reviewed precuations       Pertinent Vitals/Pain Pain Assessment: Faces Faces Pain Scale: Hurts a little bit Pain Location:  R knee Pain Descriptors / Indicators: Guarding;Sore Pain Intervention(s): Limited activity within patient's tolerance;Monitored during session;Premedicated before session;Repositioned    Home Living                      Prior Function            PT Goals (current goals can now be found in the care plan section) Acute Rehab PT Goals Patient Stated Goal: Home Progress towards PT goals: Progressing toward goals    Frequency    7X/week      PT Plan Current plan remains appropriate    Co-evaluation             End of Session Equipment Utilized During Treatment: Gait belt Activity Tolerance: Patient tolerated treatment well;No increased pain Patient left: in chair;with call bell/phone within reach;with family/visitor present     Time: [id number]  PT Time Calculation (min) (ACUTE ONLY): 32 min  Charges:  $Gait Training: 8-22 mins $Therapeutic Exercise: 8-22 mins                    G Codes:      Salina April, PTA Pager: [phone]   04/05/2016, 3:42 PM

## 2016-04-05 NOTE — Op Note (Signed)
NAMEELRICO, Aaron [name] NO.:  [id number]  MEDICAL RECORD NO.:  [id number]  LOCATION:  [id number]                        FACILITY:  [facility]  PHYSICIAN:  Alta Corning, M.D.   DATE OF BIRTH:  08/24/1945  DATE OF PROCEDURE:  04/04/2016 DATE OF DISCHARGE:                              OPERATIVE REPORT   PREOPERATIVE DIAGNOSIS:  End-stage degenerative joint disease, right knee with bone-on-bone change.  POSTOPERATIVE DIAGNOSIS:  End-stage degenerative joint disease, right knee with bone-on-bone change.  PROCEDURE:  Right total knee replacement with an Attune system size 7 femur, size 8 tibia, size 6 bridging bearing, and a 41-mm all polyethylene patella.  SURGEON:  Alta Corning, M.D.  ASSISTANT:  Gary Fleet, P.A.  ANESTHESIA:  Spinal.  BRIEF HISTORY:  Aaron [name] is a 70 year old male with long history of significant complaints of bilateral knee pain.  We did a left total knee and he did great with that.  He comes back today for right total knee replacement.  He had bone-on-bone change on x-ray.  He was having night pain and light activity pain, and after failure of all conservative care, he was taken to the operating room for right total knee replacement.  DESCRIPTION OF PROCEDURE:  The patient was taken to the operating room. After adequate anesthesia was obtained with spinal anesthetic, the patient was placed supine on the operating table.  Right leg was prepped and draped in usual sterile fashion.  Following this, the leg was exsanguinated.  Blood pressure tourniquet was inflated to 300 mmHg. Following this, a midline incision was made in the subcutaneous tissue down to the level of the extensor mechanism and a medial parapatellar arthrotomy was undertaken.  Following this, medial and lateral meniscus were removed, retropatellar fat pad, synovium in the anterior aspect of the femur, and anterior and posterior cruciates.  So, once this was done, attention was  turned to the femur where an intramedullary pilot hole was drilled and a 4-degree valgus inclination was used with a 10 degrees of distal bone was cut because the patient has significant flexion contracture at that time.  Once this was completed, attention was turned towards the femur, which was sized to a 7.  The rotational alignment was set and drilled.  This was measured to make sure it was appropriate and a 7 was used.  Anterior and posterior cuts were made, chamfers and box, and attention was then turned towards the tibia.  The tibia sized to an 8.  It was drilled and keeled, and then trial components were put in place, size 8 on the tibia and size 7 on the femur.  A size 7 spacer was placed and I was able to get in, and flexion and extension are little tightish , and so, we did a more medial releasing and then did an osteophyte resection, posterior capsular release and size 7 fit much better at this point, still little tightish in extension.  We then went towards the patella, cut it down to a level of 13 mm.  A 42 poly was chosen and lugs were drilled.  Lugs were drilled on the femur.  Knee put through a range of  motion.  Excellent stability and range of motion were achieved.  At this point, all trial components were removed.  The knee was copiously and thoroughly lavaged, pulsatile lavage, irrigation and suctioned dry.  The final components were then cemented in place, size 8 tibia, size 7 femur, and a 7-mm bridging-bearing trial was placed followed by 41 all poly patella placed and held with a clamp, and all excess bone cement removed, and the cement was allowed to completely harden.  Once hardened, tourniquet was let down.  All bleeding was controlled with electrocautery.  A 60 mL of 20 mL Exparel, 20 mL Marcaine, and 20 mL saline were instilled throughout the synovial reflection for postoperative pain control.  Once this was completed, attention was turned towards the evaluation  of the knee with the tourniquet down.  We put a size 7 back in, checked it with a 6, I like the 6 better.  There was excellent stability in flexion, and little bit better in extension.  I went with the size 6 poly.  The knee was then irrigated thoroughly, suctioned dry and the final poly was placed.  Medial parapatellar arthrotomy was closed with 1 Vicryl running, skin with 0 and 2-0 Vicryl, and 3-0 Monocryl subcuticular. Benzoin and Steri-Strips were applied.  Sterile compressive dressing applied, and the patient was taken to the recovery and was noted to be in satisfactory condition.  Estimated blood loss for the procedure is minimal.     Alta Corning, M.D.     Corliss Skains  D:  04/04/2016  T:  04/05/2016  Job:  [id number]

## 2016-04-05 NOTE — Progress Notes (Signed)
Orthopedic Tech Progress Note Patient Details:  Aaron [name] 07-03-1946 [id number] Put patient in CPM 1850 CPM Right Knee CPM Right Knee: On Right Knee Flexion (Degrees): 70 Right Knee Extension (Degrees): 0   Charlott Rakes 04/05/2016, 6:53 PM

## 2016-04-05 NOTE — Progress Notes (Signed)
Orthopedic Tech Progress Note Patient Details:  Aaron [name] Feb 17, 1946 [id number]  Patient ID: Aaron [name], male   DOB: [birth year], 70 y.o.   MRN: [id number] Applied cpm 0-60  Aaron [name] 04/05/2016, 6:20 AM

## 2016-04-06 LAB — CBC
HCT: 28.3 % — ABNORMAL LOW (ref 39.0–52.0)
HEMOGLOBIN: 9.3 g/dL — AB (ref 13.0–17.0)
MCH: 27.7 pg (ref 26.0–34.0)
MCHC: 32.9 g/dL (ref 30.0–36.0)
MCV: 84.2 fL (ref 78.0–100.0)
PLATELETS: 214 10*3/uL (ref 150–400)
RBC: 3.36 MIL/uL — AB (ref 4.22–5.81)
RDW: 14.5 % (ref 11.5–15.5)
WBC: 9.6 10*3/uL (ref 4.0–10.5)

## 2016-04-06 NOTE — Progress Notes (Addendum)
Subjective: 2 Days Post-Op Procedure(s) (LRB): TOTAL KNEE ARTHROPLASTY (Right) Patient reports pain as moderate.  Progressing with physical therapy. Taking by mouth, voiding okay . Positive BM. Denies chest pain or shortness of breath  Objective: Vital signs in last 24 hours: Temp:  [98.1 F (36.7 C)-98.6 F (37 C)] 98.1 F (36.7 C) (10/04 0403) Pulse Rate:  [76-82] 81 (10/04 0403) Resp:  [18-19] 18 (10/04 0403) BP: (121-154)/(61-71) 121/71 (10/04 0403) SpO2:  [96 %-97 %] 97 % (10/04 0403)  Intake/Output from previous day: 10/03 0701 - 10/04 0700 In: 0  Out: 350 [Urine:350] Intake/Output this shift: No intake/output data recorded.   Recent Labs  04/05/16 0838 04/06/16 0617  HGB 10.0* 9.3*    Recent Labs  04/05/16 0838 04/06/16 0617  WBC 6.7 9.6  RBC 3.59* 3.36*  HCT 30.1* 28.3*  PLT 184 214    Recent Labs  04/05/16 0838  NA 132*  K 3.8  CL 101  CO2 22  BUN 19  CREATININE 1.11  GLUCOSE 230*  CALCIUM 8.4*   No results for input(s): LABPT, INR in the last 72 hours. Right lower extremity exam: Moderate swelling in calf and thigh. Calf is mildly tender. Negative Homans. Right knee dressing is benign. He is firing his quad but has some difficulty doing a straight leg raise. Range of motion -10 to 60 of flexion. Neurovascular intact Sensation intact distally Dorsiflexion/Plantar flexion intact  Assessment/Plan: 2 Days Post-Op Procedure(s) (LRB): TOTAL KNEE ARTHROPLASTY (Right) Plan: Continue Xarelto 20 mg daily which he was on preoperatively. Continue TED hose. Up with therapy Discharge home with home health after seen by physical therapy today. Follow-up in office in 10 days. Oleta Gunnoe G 04/06/2016, 9:30 AM

## 2016-04-06 NOTE — Progress Notes (Signed)
Physical Therapy Treatment Patient Details Name: Aaron [name] MRN: [id number] DOB: 07-22-1945 Today's Date: 04/06/2016    History of Present Illness pt presents post R TKR.  pt with hx of L TKR, CVT, PE, Anxiety, Depression, HTN, CAD, CA, PTSD, and R Prosthetic Eye.      PT Comments    Pt moving well today and able to perform stairs without cueing.  Pt expresses no questions or concerns for PT.  Pt ready for D/C from PT perspective.    Follow Up Recommendations  Home health PT;Supervision/Assistance - 24 hour     Equipment Recommendations  None recommended by PT    Recommendations for Other Services       Precautions / Restrictions Precautions Precautions: Fall Required Braces or Orthoses: Knee Immobilizer - Right Knee Immobilizer - Right: Discontinue once straight leg raise with < 10 degree lag Restrictions Weight Bearing Restrictions: Yes RLE Weight Bearing: Weight bearing as tolerated    Mobility  Bed Mobility               General bed mobility comments: pt sitting in recliner.  Transfers Overall transfer level: Needs assistance Equipment used: Rolling walker (2 wheeled) Transfers: Sit to/from Stand Sit to Stand: Min guard         General transfer comment: pt demonstrates good technique today.    Ambulation/Gait Ambulation/Gait assistance: Min guard Ambulation Distance (Feet): 200 Feet Assistive device: Rolling walker (2 wheeled) Gait Pattern/deviations: Step-through pattern;Trunk flexed     General Gait Details: pt with more step through gait today and only needs cueing for more upright posture.  pt able to increase ambulation distance.     Stairs Stairs: Yes Stairs assistance: Supervision Stair Management: One rail Right;Step to pattern;Forwards Number of Stairs: 4 General stair comments: pt demonstrates safety on stairs and able to recall technique from previous TKR.    Wheelchair Mobility    Modified Rankin (Stroke Patients Only)        Balance Overall balance assessment: Needs assistance Sitting-balance support: No upper extremity supported;Feet supported Sitting balance-Leahy Scale: Good     Standing balance support: Single extremity supported;Bilateral upper extremity supported;No upper extremity supported;During functional activity Standing balance-Leahy Scale: Fair                      Cognition Arousal/Alertness: Awake/alert Behavior During Therapy: WFL for tasks assessed/performed Overall Cognitive Status: Within Functional Limits for tasks assessed                      Exercises Total Joint Exercises Long Arc Quad: AAROM;Right;10 reps Knee Flexion: AAROM;Right;10 reps Goniometric ROM: ~ 10 - 80    General Comments        Pertinent Vitals/Pain Pain Assessment: 0-10 Pain Score: 4  Pain Location: R knee Pain Descriptors / Indicators: Grimacing;Guarding;Sore Pain Intervention(s): Monitored during session;Premedicated before session;Repositioned    Home Living                      Prior Function            PT Goals (current goals can now be found in the care plan section) Acute Rehab PT Goals Patient Stated Goal: Home PT Goal Formulation: With patient Time For Goal Achievement: 04/12/16 Potential to Achieve Goals: Good Progress towards PT goals: Progressing toward goals    Frequency    7X/week      PT Plan Current plan remains appropriate    Co-evaluation  End of Session Equipment Utilized During Treatment: Gait belt Activity Tolerance: Patient tolerated treatment well Patient left: in chair;with call bell/phone within reach;with nursing/sitter in room;with family/visitor present     Time: [id number] PT Time Calculation (min) (ACUTE ONLY): 19 min  Charges:  $Gait Training: 8-22 mins                    G CodesCatarina Hartshorn, [place] 04/06/2016, 10:51 AM

## 2016-04-06 NOTE — Discharge Summary (Signed)
Patient ID: Aaron [name] MRN: [id number] DOB/AGE: August 20, 1945 70 y.o.  Admit date: 04/04/2016 Discharge date: 04/06/2016  Admission Diagnoses:  Principal Problem:   Primary osteoarthritis of right knee   Discharge Diagnoses:  Same  Past Medical History:  Diagnosis Date  . Anemia    "@ birth"  . Anxiety   . Blood transfusion    "@ birth"  . Cancer ([place])    Head - skin cancer  . Constipation due to pain medication   . Coronary artery disease    DES LAD 2005  . Depression   . Full dentures   . GERD (gastroesophageal reflux disease)   . 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 Heart murmur   . Hyperlipidemia   . Hypertension   . Insomnia   . Osteoarthritis   . PONV (postoperative nausea and vomiting)    2006 - knee surgery  . Pre-diabetes   . Psoriasis   . PTSD (post-traumatic stress disorder)   . Pulmonary embolism ([place]) ~ 03/2006   bilaterally  . Seasonal allergies   . Shortness of breath 06/08/11   "w/exertion; that's why I'm here"  . Sleep apnea, obstructive   . Umbilical hernia     Surgeries: Procedure(s):Right TOTAL KNEE ARTHROPLASTY on 04/04/2016   Discharged Condition: Improved  Hospital Course: Aaron [name] is an 70 y.o. male who was admitted 04/04/2016 for operative treatment ofPrimary osteoarthritis of right knee. Patient has severe unremitting pain that affects sleep, daily activities, and work/hobbies. After pre-op clearance the patient was taken to the operating room on 04/04/2016 and underwent  Procedure(s): Right TOTAL KNEE ARTHROPLASTY.    Patient was given perioperative antibiotics: Anti-infectives    Start     Dose/Rate Route Frequency Ordered Stop   04/04/16 1900  clindamycin (CLEOCIN) IVPB 600 mg     600 mg 100 mL/hr over 30 Minutes Intravenous Every 6 hours 04/04/16 1751 04/05/16 0213   04/04/16 1215  clindamycin (CLEOCIN) IVPB 900 mg     900 mg 100 mL/hr over 30 Minutes Intravenous On call to O.R. 04/03/16 1336  04/04/16 1326       Patient was given sequential compression devices, early ambulation, and chemoprophylaxis to prevent DVT. The patient was continued on Xarelto 20 mg daily along with SCDs and TED hose for DVT prophylaxis. He was taking Xarelto preoperatively.  Patient benefited maximally from hospital stay and there were no complications.    Recent vital signs: Patient Vitals for the past 24 hrs:  BP Temp Temp src Pulse Resp SpO2  04/06/16 0403 121/71 98.1 F (36.7 C) Oral 81 18 97 %  04/05/16 1949 131/61 98.6 F (37 C) Oral 81 19 96 %  04/05/16 1459 (!) 150/61 98.4 F (36.9 C) - 82 18 96 %  04/05/16 1014 (!) 154/67 - - 76 - -     Recent laboratory studies:  Recent Labs  04/05/16 0838 04/06/16 0617  WBC 6.7 9.6  HGB 10.0* 9.3*  HCT 30.1* 28.3*  PLT 184 214  NA 132*  --   K 3.8  --   CL 101  --   CO2 22  --   BUN 19  --   CREATININE 1.11  --   GLUCOSE 230*  --   CALCIUM 8.4*  --      Discharge Medications:     Medication List    TAKE these medications   ALPRAZolam 0.5 MG tablet Commonly known as:  XANAX TAKE 1 TABLET BY MOUTH 3 TIMES A DAY AS NEEDED FOR ANXIETY   atorvastatin 40 MG tablet Commonly known as:  LIPITOR Take 1 tablet by mouth  daily   fenofibrate 160 MG tablet Take 1 tablet by mouth  daily   losartan-hydrochlorothiazide 100-25 MG tablet Commonly known as:  HYZAAR Take 1 tablet by mouth daily.   omeprazole 20 MG capsule Commonly known as:  PRILOSEC Take 1 capsule (20 mg total) by mouth daily.   oxyCODONE-acetaminophen 5-325 MG tablet Commonly known as:  PERCOCET/ROXICET Take 1-2 tablets by mouth every 4 (four) hours as needed for severe pain.   polyethylene glycol powder powder Commonly known as:  MIRALAX Take 17 g by mouth daily.   rivaroxaban 20 MG Tabs tablet Commonly known as:  XARELTO Take 1 tablet (20 mg total) by mouth daily with supper.   tiZANidine 2 MG tablet Commonly known as:  ZANAFLEX Take 1 tablet (2 mg total) by  mouth every 8 (eight) hours as needed for muscle spasms.   venlafaxine XR 37.5 MG 24 hr capsule Commonly known as:  EFFEXOR-XR Take 1 capsule (37.5 mg total) by mouth daily.       Diagnostic Studies: No results found.  Disposition: 06-Home-Health Care Svc  Discharge Instructions    CPM    Complete by:  As directed    Continuous passive motion machine (CPM):      Use the CPM from 0 to 60 for 8 hours per day.      You may increase by 5-10 per day.  You may break it up into 2 or 3 sessions per day.      Use CPM for 1-2 weeks or until you are told to stop.   Call MD / Call 911    Complete by:  As directed    If you experience chest pain or shortness of breath, CALL 911 and be transported to the hospital emergency room.  If you develope a fever above 101 F, pus (white drainage) or increased drainage or redness at the wound, or calf pain, call your surgeon's office.   Constipation Prevention    Complete by:  As directed    Drink plenty of fluids.  Prune juice may be helpful.  You may use a stool softener, such as Colace (over the counter) 100 mg twice a day.  Use MiraLax (over the counter) for constipation as needed.   Diet general    Complete by:  As directed    Do not put a pillow under the knee. Place it under the heel.    Complete by:  As directed    Increase activity slowly as tolerated    Complete by:  As directed    Weight bearing as tolerated    Complete by:  As directed    Laterality:  right   Extremity:  Lower      Follow-up Information    Jamorion Gomillion G, PA-C. Schedule an appointment as soon as possible for a visit on 04/14/2016.   Specialty:  Orthopedic Surgery Contact information: [place] [address] [phone]            Signed: Erlene Senters 04/06/2016, 9:37 AM

## 2016-04-06 NOTE — Progress Notes (Signed)
Pt ready for discharge. Education/instructions reviewed with pt and wife, and all questions/concerns addressed. IV removed and belongings gathered. Pt will be transported out via wheelchair to wife's vehicle. Will continue to monitor 

## 2016-04-06 NOTE — Progress Notes (Signed)
Orthopedic Tech Progress Note Patient Details:  Aaron [name] June 30, 1946 [id number]  Patient ID: Aaron [name], male   DOB: [birth year], 70 y.o.   MRN: [id number] Applied cpm 0-70  Karolee Stamps 04/06/2016, 6:46 AM

## 2016-04-12 ENCOUNTER — Other Ambulatory Visit: Payer: Self-pay | Admitting: Family

## 2016-04-13 NOTE — Telephone Encounter (Signed)
Medication is no longer on pts list and it looks like he is now on oxycodone. Please advise

## 2016-04-17 ENCOUNTER — Other Ambulatory Visit: Payer: Self-pay | Admitting: Family

## 2016-04-19 ENCOUNTER — Other Ambulatory Visit: Payer: Self-pay | Admitting: Family

## 2016-04-19 NOTE — Telephone Encounter (Signed)
Last refill was 02/17/16

## 2016-04-20 NOTE — Telephone Encounter (Signed)
Rx sent 

## 2016-05-30 ENCOUNTER — Telehealth: Payer: Self-pay | Admitting: *Deleted

## 2016-05-30 MED ORDER — TRAMADOL HCL 50 MG PO TABS: 50.0000 mg | ORAL_TABLET | Freq: Three times a day (TID) | ORAL | 0 refills | Status: DC | PRN

## 2016-05-30 NOTE — Telephone Encounter (Signed)
Left msg on triage stating needing to get refill on Tramadol. Rx was given when he had knee surgery. He states he takes for degenerative disc disease, and would like to have for break-thru pain instead of taking so much of the oxycodone...Aaron [name]

## 2016-05-30 NOTE — Telephone Encounter (Signed)
Medication refilled. Recommend decreasing oxycodone when starting Tramadol.

## 2016-05-31 NOTE — Telephone Encounter (Signed)
Notified pt w/greg response. Verified which pharmacy he want rx sent to [facility]. [facility]ed [facility]...Johny Chess

## 2016-06-01 ENCOUNTER — Telehealth: Payer: Self-pay

## 2016-06-01 NOTE — Telephone Encounter (Signed)
Called to let pt know that handicap placard was up front ready for pick up.

## 2016-06-16 ENCOUNTER — Other Ambulatory Visit: Payer: Self-pay | Admitting: Family

## 2016-06-16 NOTE — Telephone Encounter (Signed)
Last refill was 04/19/16

## 2016-06-17 NOTE — Telephone Encounter (Signed)
Rx faxed

## 2016-06-22 ENCOUNTER — Other Ambulatory Visit: Payer: Self-pay | Admitting: Family

## 2016-06-23 NOTE — Telephone Encounter (Signed)
Rx for tramadol faxed.

## 2016-06-26 NOTE — Progress Notes (Signed)
Aaron [name] Date of Birth: 12-30-1945 Medical Record [id number]  History of Present Illness: Aaron [name] is seen for follow up today. Has known CAD with stenting of the mid LAD in August of 2005 with Cypher stent for a 70% lesion. Nuclear study had showed evidence of anteroapical ischemia. Myoview in June 2014 was normal. Other issues include HLD, HTN, morbid obesity, depression, GERD, OSA. Has had 2 occurences of pulmonary emboli and is on chronic anticoagulation with Xarelto. He had bilateral knee replacement this year. Denies any chest pain, SOB, or palpitations. He is still on Xarelto. Has trouble sleeping due to pain and need for urination. He does wear CPAP which works well.   Current Outpatient Prescriptions on File Prior to Visit  Medication Sig Dispense Refill  . ALPRAZolam (XANAX) 0.5 MG tablet TAKE 1 TABLET BY MOUTH 3 TIMES A DAY AS NEEDED FOR ANXIETY 90 tablet 1  . atorvastatin (LIPITOR) 40 MG tablet Take 1 tablet by mouth  daily 90 tablet 1  . fenofibrate 160 MG tablet Take 1 tablet by mouth  daily 90 tablet 1  . losartan-hydrochlorothiazide (HYZAAR) 100-25 MG tablet Take 1 tablet by mouth daily. 90 tablet 3  . omeprazole (PRILOSEC) 20 MG capsule Take 1 capsule (20 mg total) by mouth daily. 90 capsule 3  . polyethylene glycol powder (MIRALAX) powder Take 17 g by mouth daily. 1071 g 3  . rivaroxaban (XARELTO) 20 MG TABS tablet Take 1 tablet (20 mg total) by mouth daily with supper. 90 tablet 3  . tiZANidine (ZANAFLEX) 2 MG tablet Take 1 tablet (2 mg total) by mouth every 8 (eight) hours as needed for muscle spasms. 50 tablet 0  . traMADol (ULTRAM) 50 MG tablet TAKE 1 TO 2 TABLETS BY MOUTH EVERY 8 HOURS AS NEEDED 90 tablet 0  . venlafaxine XR (EFFEXOR-XR) 37.5 MG 24 hr capsule Take 1 capsule (37.5 mg total) by mouth daily. 90 capsule 3   No current facility-administered medications on file prior to visit.     Allergies  Allergen Reactions  . Wellbutrin [Bupropion] Other (See  Comments)    Hallucinations, sweating  . Amoxicillin Palpitations    Past Medical History:  Diagnosis Date  . Anemia    "@ birth"  . Anxiety   . Blood transfusion    "@ birth"  . Cancer ([place])    Head - skin cancer  . Constipation due to pain medication   . Coronary artery disease    DES LAD 2005  . Depression   . Full dentures   . GERD (gastroesophageal reflux disease)   . 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 Heart murmur   . Hyperlipidemia   . Hypertension   . Insomnia   . Osteoarthritis   . PONV (postoperative nausea and vomiting)    2006 - knee surgery  . Pre-diabetes   . Psoriasis   . PTSD (post-traumatic stress disorder)   . Pulmonary embolism ([place]) ~ 03/2006   bilaterally  . Seasonal allergies   . Shortness of breath 06/08/11   "w/exertion; that's why I'm here"  . Sleep apnea, obstructive   . Umbilical hernia     Past Surgical History:  Procedure Laterality Date  . CARDIAC CATHETERIZATION    . carpel tunnel release Bilateral ~ 06/2005/~ 08/2005   right/left  . COLONOSCOPY W/ BIOPSIES AND POLYPECTOMY    . CORONARY ANGIOPLASTY    . CORONARY STENT PLACEMENT  02/13/2004  1  . ENUCLEATION Right    age 76  . EYE SURGERY     age 60; "ruptured  right pupil"  . EYE SURGERY     age 6  . EYE SURGERY  1957   placed artificial right eye  . INGUINAL HERNIA REPAIR Right 1983  . INGUINAL HERNIA REPAIR Left 1985  . KNEE ARTHROSCOPY  ~ 06/2005   right  . MULTIPLE TOOTH EXTRACTIONS    . spinal injections  2006-2007   "maybe 10 injections for herniated discs"  . TONSILLECTOMY     "when I was real young"  . TOTAL KNEE ARTHROPLASTY Left 10/09/2015   Procedure: TOTAL KNEE ARTHROPLASTY;  Surgeon: Dorna Leitz, MD;  Location: [place];  Service: Orthopedics;  Laterality: Left;  . TOTAL KNEE ARTHROPLASTY Right 04/04/2016   Procedure: TOTAL KNEE ARTHROPLASTY;  Surgeon: Dorna Leitz, MD;  Location: [place];  Service: Orthopedics;  Laterality: Right;     History  Smoking Status  . Former Smoker  . Packs/day: 1.50  . Years: 25.00  . Types: Cigarettes  . Quit date: 07/10/1995  Smokeless Tobacco  . Never Used    History  Alcohol Use No    Family History  Problem Relation Age of Onset  . Asthma Father   . Emphysema Father   . Breast cancer Sister   . Liver cancer Sister   . Asthma Sister   . Asthma Brother     Review of Systems: The review of systems is per the HPI.  All other systems were reviewed and are negative.  Physical Exam: BP (!) 156/70 (BP Location: Left Arm, Cuff Size: Large)   Pulse 76   Ht 5' 5.5" (1.664 m)   Wt 254 lb (115.2 kg)   BMI 41.62 kg/m  Patient is pleasant and in no acute distress. He is morbidly obese.  Skin is warm and dry. Color is normal.  HEENT is unremarkable. Normocephalic/atraumatic. PERRL. Sclera are nonicteric. Neck is supple. No masses. No JVD. Lungs are clear. Cardiac exam shows a regular rate and rhythm. No gallop or murmur. Abdomen is soft. Extremities are without edema. He is wearing  compression hose on his legs. Gait and ROM are intact. No gross neurologic deficits noted.  LABORATORY DATA:   Lab Results  Component Value Date   WBC 9.6 04/06/2016   HGB 9.3 (L) 04/06/2016   HCT 28.3 (L) 04/06/2016   PLT 214 04/06/2016   GLUCOSE 230 (H) 04/05/2016   CHOL 151 02/11/2015   TRIG 114.0 02/11/2015   HDL 41.10 02/11/2015   LDLCALC 87 02/11/2015   ALT 15 (L) 03/25/2016   AST 21 03/25/2016   NA 132 (L) 04/05/2016   K 3.8 04/05/2016   CL 101 04/05/2016   CREATININE 1.11 04/05/2016   BUN 19 04/05/2016   CO2 22 04/05/2016   TSH 4.58 (H) 02/11/2015   PSA 0.58 02/11/2015   INR 1.11 03/25/2016   HGBA1C 6.4 03/08/2016    Assessment / Plan: 1. CAD - s/p stent of the LAD in 2005. No active symptoms. Asymptomatic. Normal Myoview June 2014. Continue medical Rx.  2. HTN - blood pressure is elevated some. Will monitor at home so we can follow trend. If it remains elevated may need to  consider additional meds.   3. HLD on therapy.  On atorvastatin 40 mg daily. Recommend lipid panel be done with next blood work.  4. Obesity -  increase aerobic activities.   5. Recurrent PEs. On anticoagulation. Continue Xarelto  for now. Recommend he not take NSAIDs due to bleeding risk.   6. Osteoarthritis. S/p bilateral TKR.    I will follow up in 6 months.

## 2016-06-30 ENCOUNTER — Encounter: Payer: Self-pay | Admitting: Cardiology

## 2016-06-30 ENCOUNTER — Ambulatory Visit (INDEPENDENT_AMBULATORY_CARE_PROVIDER_SITE_OTHER): Payer: Medicare Other | Admitting: Cardiology

## 2016-06-30 VITALS — BP 156/70 | HR 76 | Ht 65.5 in | Wt 254.0 lb

## 2016-06-30 DIAGNOSIS — I10 Essential (primary) hypertension: Secondary | ICD-10-CM | POA: Diagnosis not present

## 2016-06-30 DIAGNOSIS — I2699 Other pulmonary embolism without acute cor pulmonale: Secondary | ICD-10-CM

## 2016-06-30 DIAGNOSIS — E7800 Pure hypercholesterolemia, unspecified: Secondary | ICD-10-CM | POA: Diagnosis not present

## 2016-06-30 DIAGNOSIS — I2510 Atherosclerotic heart disease of native coronary artery without angina pectoris: Secondary | ICD-10-CM | POA: Diagnosis not present

## 2016-06-30 NOTE — Patient Instructions (Signed)
You should have a lipid panel done with your next blood draw.   Continue your current therapy  I will see you in 6 months.

## 2016-07-21 ENCOUNTER — Ambulatory Visit: Payer: Medicare Other | Admitting: Family

## 2016-07-24 ENCOUNTER — Other Ambulatory Visit: Payer: Self-pay | Admitting: Family

## 2016-07-25 NOTE — Telephone Encounter (Signed)
Last refill was 06/23/16 #90 with 0 refills.

## 2016-07-26 NOTE — Telephone Encounter (Signed)
Faxed

## 2016-07-28 ENCOUNTER — Encounter: Payer: Self-pay | Admitting: Adult Health

## 2016-08-01 ENCOUNTER — Ambulatory Visit (INDEPENDENT_AMBULATORY_CARE_PROVIDER_SITE_OTHER): Payer: Medicare Other | Admitting: Adult Health

## 2016-08-01 ENCOUNTER — Encounter: Payer: Self-pay | Admitting: Adult Health

## 2016-08-01 VITALS — BP 146/78 | HR 74 | Temp 97.6°F | Ht 65.5 in | Wt 249.6 lb

## 2016-08-01 DIAGNOSIS — E669 Obesity, unspecified: Secondary | ICD-10-CM | POA: Insufficient documentation

## 2016-08-01 DIAGNOSIS — E66811 Obesity, class 1: Secondary | ICD-10-CM | POA: Insufficient documentation

## 2016-08-01 DIAGNOSIS — G4733 Obstructive sleep apnea (adult) (pediatric): Secondary | ICD-10-CM

## 2016-08-01 NOTE — Patient Instructions (Addendum)
Continue on CPAP At bedtime  .  Work on weight loss Do not drive a sleepy Follow-up with Dr. Halford Chessman  in 1 year and as needed

## 2016-08-01 NOTE — Assessment & Plan Note (Signed)
Wt loss  

## 2016-08-01 NOTE — Progress Notes (Signed)
@Patient  ID: Aaron [name], male    DOB: 03/17/1946, 71 y.o.   MRN: [id number]  Chief Complaint  Patient presents with  . Follow-up    OSA     Referring provider: Golden Circle, FNP  HPI: 71 yo male former smoker followed for OSA on CPAP .  Hx of PE on Xarelto  pulmonary nodules first noted in 2005.  These have been stable since, and likely are benign.  He does not need additional radiographic follow up for these.  TEST  06/08/11 CT chest >> b/l PE, ATX, LUL 63mm nodule  12/11/11 CT chest >> LUL nodule resolved, but new b/l nodules up to 8 mm  06/20/12 CT chest >> no change  06/20/13 CT chest >> no change PSG 09/22/13 >> AHI 5, RDI 61.4, SaO2 low 90%. Auto CPAP 11/30/13 to 12/29/13 >> used on 30 of 30 nights with average 9 hrs 4 min.  Average AHI 0.7 with median CPAP 8 cm H2O and 95 th percentile CPAP 10 cm H2O    08/01/2016 Follow up : OSA  Patient presents for a yearly follow-up for sleep apnea. Patient says he is doing well on C Pap at bedtime. Wears at bedtime and naps. Sleeps well and has no more snoring .   Patient is on AutoSet 5-20 cm of H2O. Download shows excellent compliance with average usage at 10 hours. AHI 0.7. Minimum leaks. We discussed weight loss.  Last seen in 2015.     Allergies  Allergen Reactions  . Wellbutrin [Bupropion] Other (See Comments)    Hallucinations, sweating  . Amoxicillin Palpitations    Immunization History  Administered Date(s) Administered  . Influenza Split 05/05/2011, 04/03/2013  . Influenza Whole 04/30/2012  . Influenza, High Dose Seasonal PF 03/08/2016  . Pneumococcal Conjugate-13 02/11/2015  . Pneumococcal Polysaccharide-23 06/09/2011  . Td 02/11/2015    Past Medical History:  Diagnosis Date  . Anemia    "@ birth"  . Anxiety   . Blood transfusion    "@ birth"  . Cancer ([place])    Head - skin cancer  . Constipation due to pain medication   . Coronary artery disease    DES LAD 2005  . Depression   . Full dentures    . GERD (gastroesophageal reflux disease)   . 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 Heart murmur   . Hyperlipidemia   . Hypertension   . Insomnia   . Osteoarthritis   . PONV (postoperative nausea and vomiting)    2006 - knee surgery  . Pre-diabetes   . Psoriasis   . PTSD (post-traumatic stress disorder)   . Pulmonary embolism ([place]) ~ 03/2006   bilaterally  . Seasonal allergies   . Shortness of breath 06/08/11   "w/exertion; that's why I'm here"  . Sleep apnea, obstructive   . Umbilical hernia     Tobacco History: History  Smoking Status  . Former Smoker  . Packs/day: 1.50  . Years: 25.00  . Types: Cigarettes  . Quit date: 07/10/1995  Smokeless Tobacco  . Never Used   Counseling given: Not Answered   Outpatient Encounter Prescriptions as of 08/01/2016  Medication Sig  . ALPRAZolam (XANAX) 0.5 MG tablet TAKE 1 TABLET BY MOUTH 3 TIMES A DAY AS NEEDED FOR ANXIETY  . atorvastatin (LIPITOR) 40 MG tablet Take 1 tablet by mouth  daily  . fenofibrate 160 MG tablet Take 1 tablet by mouth  daily  . HYDROcodone-acetaminophen (NORCO/VICODIN) 5-325 MG tablet Take 1 tablet by mouth every 12 (twelve) hours as needed for pain.  Marland Kitchen losartan-hydrochlorothiazide (HYZAAR) 100-25 MG tablet Take 1 tablet by mouth daily.  Marland Kitchen omeprazole (PRILOSEC) 20 MG capsule Take 1 capsule (20 mg total) by mouth daily.  . polyethylene glycol powder (MIRALAX) powder Take 17 g by mouth daily.  . rivaroxaban (XARELTO) 20 MG TABS tablet Take 1 tablet (20 mg total) by mouth daily with supper.  Marland Kitchen tiZANidine (ZANAFLEX) 2 MG tablet Take 1 tablet (2 mg total) by mouth every 8 (eight) hours as needed for muscle spasms.  . traMADol (ULTRAM) 50 MG tablet TAKE 1 TO 2 TABLETS BY MOUTH EVERY 8 HOURS AS NEEDED  . venlafaxine XR (EFFEXOR-XR) 37.5 MG 24 hr capsule Take 1 capsule (37.5 mg total) by mouth daily.   No facility-administered encounter medications on file as of 08/01/2016.      Review  of Systems  Constitutional:   No  weight loss, night sweats,  Fevers, chills, + fatigue, or  lassitude.  HEENT:   No headaches,  Difficulty swallowing,  Tooth/dental problems, or  Sore throat,                No sneezing, itching, ear ache, nasal congestion, post nasal drip,   CV:  No chest pain,  Orthopnea, PND, swelling in lower extremities, anasarca, dizziness, palpitations, syncope.   GI  No heartburn, indigestion, abdominal pain, nausea, vomiting, diarrhea, change in bowel habits, loss of appetite, bloody stools.   Resp: No shortness of breath with exertion or at rest.  No excess mucus, no productive cough,  No non-productive cough,  No coughing up of blood.  No change in color of mucus.  No wheezing.  No chest wall deformity  Skin: no rash or lesions.  GU: no dysuria, change in color of urine, no urgency or frequency.  No flank pain, no hematuria   MS:  No joint pain or swelling.  No decreased range of motion.  No back pain.    Physical Exam  BP (!) 146/78   Pulse 74   Temp 97.6 F (36.4 C) (Oral)   Ht 5' 5.5" (1.664 m)   Wt 249 lb 9.6 oz (113.2 kg)   SpO2 97%   BMI 40.90 kg/m   GEN: A/Ox3; pleasant , NAD, obese    HEENT:  [place]/AT,  EACs-clear, TMs-wnl, NOSE-clear, THROAT-clear, no lesions, no postnasal drip or exudate noted. Class 2-3 MP airway   NECK:  Supple w/ fair ROM; no JVD; normal carotid impulses w/o bruits; no thyromegaly or nodules palpated; no lymphadenopathy.    RESP  Clear  P & A; w/o, wheezes/ rales/ or rhonchi. no accessory muscle use, no dullness to percussion  CARD:  RRR, no m/r/g, tr  peripheral edema, pulses intact, no cyanosis or clubbing.  GI:   Soft & nt; nml bowel sounds; no organomegaly or masses detected.   Musco: Warm bil, no deformities or joint swelling noted.   Neuro: alert, no focal deficits noted.    Skin: Warm, no lesions or rashes  Psych:  No change in mood or affect. No depression or anxiety.  No memory loss.  Lab  Results:  CBC Imaging: No results found.   Assessment & Plan:   No problem-specific Assessment & Plan notes found for this encounter.     Rexene Edison, NP 08/01/2016

## 2016-08-01 NOTE — Assessment & Plan Note (Signed)
Well controlled on CPAP   Plan Patient Instructions  Continue on CPAP At bedtime  .  Work on weight loss Do not drive a sleepy Follow-up with Dr. Halford Chessman  in 1 year and as needed

## 2016-08-02 NOTE — Progress Notes (Signed)
I have reviewed and agree with assessment/plan.  [name], MD [facility] Pulmonary/Critical Care 08/02/2016, 10:09 AM Pager:  [phone]

## 2016-08-12 ENCOUNTER — Ambulatory Visit: Payer: Medicare Other | Admitting: Family

## 2016-08-15 ENCOUNTER — Ambulatory Visit (INDEPENDENT_AMBULATORY_CARE_PROVIDER_SITE_OTHER): Payer: Medicare Other | Admitting: Family

## 2016-08-15 ENCOUNTER — Encounter: Payer: Self-pay | Admitting: Family

## 2016-08-15 VITALS — BP 140/72 | HR 76 | Temp 99.2°F | Resp 18 | Ht 65.5 in | Wt 248.0 lb

## 2016-08-15 DIAGNOSIS — F411 Generalized anxiety disorder: Secondary | ICD-10-CM

## 2016-08-15 DIAGNOSIS — Z96653 Presence of artificial knee joint, bilateral: Secondary | ICD-10-CM

## 2016-08-15 DIAGNOSIS — Z96659 Presence of unspecified artificial knee joint: Secondary | ICD-10-CM | POA: Insufficient documentation

## 2016-08-15 MED ORDER — ALPRAZOLAM 0.5 MG PO TABS: ORAL_TABLET | ORAL | 1 refills | Status: DC

## 2016-08-15 NOTE — Patient Instructions (Addendum)
Thank you for choosing [facility].  SUMMARY AND INSTRUCTIONS:  Ice 20 minutes every 2 hours as needed and after activity.  Exercise daily if not 2x daily.   Medication:  Take your tramadol on a schedule for the next week (3x per day) and then as needed.   Use the hydrocodone as needed for BREAKTHROUGH PAIN.  Your prescription(s) have been submitted to your pharmacy or been printed and provided for you. Please take as directed and contact our office if you believe you are having problem(s) with the medication(s) or have any questions.  Follow up:  If your symptoms worsen or fail to improve, please contact our office for further instruction, or in case of emergency go directly to the emergency room at the closest medical facility.    Knee Exercises Ask your health care provider which exercises are safe for you. Do exercises exactly as told by your health care provider and adjust them as directed. It is normal to feel mild stretching, pulling, tightness, or discomfort as you do these exercises, but you should stop right away if you feel sudden pain or your pain gets worse.Do not begin these exercises until told by your health care provider. STRETCHING AND RANGE OF MOTION EXERCISES  These exercises warm up your muscles and joints and improve the movement and flexibility of your knee. These exercises also help to relieve pain, numbness, and tingling. Exercise A: Knee Extension, Prone 1. Lie on your abdomen on a bed. 2. Place your left / right knee just beyond the edge of the surface so your knee is not on the bed. You can put a towel under your left / right thigh just above your knee for comfort. 3. Relax your leg muscles and allow gravity to straighten your knee. You should feel a stretch behind your left / right knee. 4. Hold this position for __________ seconds. 5. Scoot up so your knee is supported between repetitions. Repeat __________ times. Complete this stretch __________  times a day. Exercise B: Knee Flexion, Active  1. Lie on your back with both knees straight. If this causes back discomfort, bend your left / right knee so your foot is flat on the floor. 2. Slowly slide your left / right heel back toward your buttocks until you feel a gentle stretch in the front of your knee or thigh. 3. Hold this position for __________ seconds. 4. Slowly slide your left / right heel back to the starting position. Repeat __________ times. Complete this exercise __________ times a day. Exercise C: Quadriceps, Prone  1. Lie on your abdomen on a firm surface, such as a bed or padded floor. 2. Bend your left / right knee and hold your ankle. If you cannot reach your ankle or pant leg, loop a belt around your foot and grab the belt instead. 3. Gently pull your heel toward your buttocks. Your knee should not slide out to the side. You should feel a stretch in the front of your thigh and knee. 4. Hold this position for __________ seconds. Repeat __________ times. Complete this stretch __________ times a day. Exercise D: Hamstring, Supine 1. Lie on your back. 2. Loop a belt or towel over the ball of your left / right foot. The ball of your foot is on the walking surface, right under your toes. 3. Straighten your left / right knee and slowly pull on the belt to raise your leg until you feel a gentle stretch behind your knee.  Do not  let your left / right knee bend while you do this.  Keep your other leg flat on the floor. 4. Hold this position for __________ seconds. Repeat __________ times. Complete this stretch __________ times a day. STRENGTHENING EXERCISES  These exercises build strength and endurance in your knee. Endurance is the ability to use your muscles for a long time, even after they get tired. Exercise E: Quadriceps, Isometric  1. Lie on your back with your left / right leg extended and your other knee bent. Put a rolled towel or small pillow under your knee if told  by your health care provider. 2. Slowly tense the muscles in the front of your left / right thigh. You should see your kneecap slide up toward your hip or see increased dimpling just above the knee. This motion will push the back of the knee toward the floor. 3. For __________ seconds, keep the muscle as tight as you can without increasing your pain. 4. Relax the muscles slowly and completely. Repeat __________ times. Complete this exercise __________ times a day. Exercise F: Straight Leg Raises - Quadriceps 1. Lie on your back with your left / right leg extended and your other knee bent. 2. Tense the muscles in the front of your left / right thigh. You should see your kneecap slide up or see increased dimpling just above the knee. Your thigh may even shake a bit. 3. Keep these muscles tight as you raise your leg 4-6 inches (10-15 cm) off the floor. Do not let your knee bend. 4. Hold this position for __________ seconds. 5. Keep these muscles tense as you lower your leg. 6. Relax your muscles slowly and completely after each repetition. Repeat __________ times. Complete this exercise __________ times a day. Exercise G: Hamstring, Isometric 1. Lie on your back on a firm surface. 2. Bend your left / right knee approximately __________ degrees. 3. Dig your left / right heel into the surface as if you are trying to pull it toward your buttocks. Tighten the muscles in the back of your thighs to dig as hard as you can without increasing any pain. 4. Hold this position for __________ seconds. 5. Release the tension gradually and allow your muscles to relax completely for __________ seconds after each repetition. Repeat __________ times. Complete this exercise __________ times a day. Exercise H: Hamstring Curls  If told by your health care provider, do this exercise while wearing ankle weights. Begin with __________ weights. Then increase the weight by 1 lb (0.5 kg) increments. Do not wear ankle weights  that are more than __________. 1. Lie on your abdomen with your legs straight. 2. Bend your left / right knee as far as you can without feeling pain. Keep your hips flat against the floor. 3. Hold this position for __________ seconds. 4. Slowly lower your leg to the starting position. Repeat __________ times. Complete this exercise __________ times a day. Exercise I: Squats (Quadriceps) 1. Stand in front of a table, with your feet and knees pointing straight ahead. You may rest your hands on the table for balance but not for support. 2. Slowly bend your knees and lower your hips like you are going to sit in a chair.  Keep your weight over your heels, not over your toes.  Keep your lower legs upright so they are parallel with the table legs.  Do not let your hips go lower than your knees.  Do not bend lower than told by your health care  provider.  If your knee pain increases, do not bend as low. 3. Hold the squat position for __________ seconds. 4. Slowly push with your legs to return to standing. Do not use your hands to pull yourself to standing. Repeat __________ times. Complete this exercise __________ times a day. Exercise J: Wall Slides (Quadriceps)  1. Lean your back against a smooth wall or door while you walk your feet out 18-24 inches (46-61 cm) from it. 2. Place your feet hip-width apart. 3. Slowly slide down the wall or door until your knees bend __________ degrees. Keep your knees over your heels, not over your toes. Keep your knees in line with your hips. 4. Hold for __________ seconds. Repeat __________ times. Complete this exercise __________ times a day. Exercise K: Straight Leg Raises - Hip Abductors 1. Lie on your side with your left / right leg in the top position. Lie so your head, shoulder, knee, and hip line up. You may bend your bottom knee to help you keep your balance. 2. Roll your hips slightly forward so your hips are stacked directly over each other and your  left / right knee is facing forward. 3. Leading with your heel, lift your top leg 4-6 inches (10-15 cm). You should feel the muscles in your outer hip lifting.  Do not let your foot drift forward.  Do not let your knee roll toward the ceiling. 4. Hold this position for __________ seconds. 5. Slowly return your leg to the starting position. 6. Let your muscles relax completely after each repetition. Repeat __________ times. Complete this exercise __________ times a day. Exercise L: Straight Leg Raises - Hip Extensors 1. Lie on your abdomen on a firm surface. You can put a pillow under your hips if that is more comfortable. 2. Tense the muscles in your buttocks and lift your left / right leg about 4-6 inches (10-15 cm). Keep your knee straight as you lift your leg. 3. Hold this position for __________ seconds. 4. Slowly lower your leg to the starting position. 5. Let your leg relax completely after each repetition. Repeat __________ times. Complete this exercise __________ times a day. This information is not intended to replace advice given to you by your health care provider. Make sure you discuss any questions you have with your health care provider. Document Released: 05/04/2005 Document Revised: 03/14/2016 Document Reviewed: 04/26/2015 Elsevier Interactive Patient Education  2017 [employer].

## 2016-08-15 NOTE — Progress Notes (Signed)
Subjective:    Patient ID: Aaron [name], male    DOB: 29-Mar-1946, 71 y.o.   MRN: [id number]  Chief Complaint  Patient presents with  . Follow-up    wants to talk about the way he is supposed to take the hydrocodone and tramadol, back pain    HPI:  Aaron [name] is a 71 y.o. male who  has a past medical history of Anemia; Anxiety; Blood transfusion; Cancer ([place]); Constipation due to pain medication; Coronary artery disease; Depression; Full dentures; GERD (gastroesophageal reflux disease); Headache(784.0); Heart murmur; Hyperlipidemia; Hypertension; Insomnia; Osteoarthritis; PONV (postoperative nausea and vomiting); Pre-diabetes; Psoriasis; PTSD (post-traumatic stress disorder); Pulmonary embolism ([place]) (~ 03/2006); Seasonal allergies; Shortness of breath (06/08/11); Sleep apnea, obstructive; and Umbilical hernia. and presents today for a follow up office visit.   1.) Status post bilateral knee replacements - Completed a left total knee replacement in April of 2017 and a right total knee replacement in October 2017. He continues to have some discomfort in his bilateral knees. Modifying factors include hydrocodone-acetaminophen and Tramadol. Takes the medication as prescribed and denies adverse side effects or constipation. No structured exercise at current time. He has concerns about increasing levels of pain that are not well managed. Reports completing physical therapy, but has been less than fully active and compliant with home exercise therapy.  2.) Anxiety - Currently maintained on alprazolam. Reports taking the medication as prescribed and denies adverse side effects. Notes that he takes the medication only as needed. Symptoms are generally well controlled with current medication regimen.    Allergies  Allergen Reactions  . Wellbutrin [Bupropion] Other (See Comments)    Hallucinations, sweating  . Amoxicillin Palpitations      Outpatient Medications Prior to Visit  Medication Sig  Dispense Refill  . atorvastatin (LIPITOR) 40 MG tablet Take 1 tablet by mouth  daily 90 tablet 1  . fenofibrate 160 MG tablet Take 1 tablet by mouth  daily 90 tablet 1  . HYDROcodone-acetaminophen (NORCO/VICODIN) 5-325 MG tablet Take 1 tablet by mouth every 12 (twelve) hours as needed for pain.    Marland Kitchen losartan-hydrochlorothiazide (HYZAAR) 100-25 MG tablet Take 1 tablet by mouth daily. 90 tablet 3  . omeprazole (PRILOSEC) 20 MG capsule Take 1 capsule (20 mg total) by mouth daily. 90 capsule 3  . polyethylene glycol powder (MIRALAX) powder Take 17 g by mouth daily. 1071 g 3  . rivaroxaban (XARELTO) 20 MG TABS tablet Take 1 tablet (20 mg total) by mouth daily with supper. 90 tablet 3  . tiZANidine (ZANAFLEX) 2 MG tablet Take 1 tablet (2 mg total) by mouth every 8 (eight) hours as needed for muscle spasms. 50 tablet 0  . traMADol (ULTRAM) 50 MG tablet TAKE 1 TO 2 TABLETS BY MOUTH EVERY 8 HOURS AS NEEDED 90 tablet 0  . venlafaxine XR (EFFEXOR-XR) 37.5 MG 24 hr capsule Take 1 capsule (37.5 mg total) by mouth daily. 90 capsule 3  . ALPRAZolam (XANAX) 0.5 MG tablet TAKE 1 TABLET BY MOUTH 3 TIMES A DAY AS NEEDED FOR ANXIETY 90 tablet 1   No facility-administered medications prior to visit.       Past Surgical History:  Procedure Laterality Date  . CARDIAC CATHETERIZATION    . carpel tunnel release Bilateral ~ 06/2005/~ 08/2005   right/left  . COLONOSCOPY W/ BIOPSIES AND POLYPECTOMY    . CORONARY ANGIOPLASTY    . CORONARY STENT PLACEMENT  02/13/2004   1  . ENUCLEATION Right    age  77  . EYE SURGERY     age 47; "ruptured  right pupil"  . EYE SURGERY     age 56  . EYE SURGERY  1957   placed artificial right eye  . INGUINAL HERNIA REPAIR Right 1983  . INGUINAL HERNIA REPAIR Left 1985  . KNEE ARTHROSCOPY  ~ 06/2005   right  . MULTIPLE TOOTH EXTRACTIONS    . spinal injections  2006-2007   "maybe 10 injections for herniated discs"  . TONSILLECTOMY     "when I was real young"  . TOTAL KNEE  ARTHROPLASTY Left 10/09/2015   Procedure: TOTAL KNEE ARTHROPLASTY;  Surgeon: Dorna Leitz, MD;  Location: [facility];  Service: Orthopedics;  Laterality: Left;  . TOTAL KNEE ARTHROPLASTY Right 04/04/2016   Procedure: TOTAL KNEE ARTHROPLASTY;  Surgeon: Dorna Leitz, MD;  Location: [facility];  Service: Orthopedics;  Laterality: Right;      Past Medical History:  Diagnosis Date  . Anemia    "@ birth"  . Anxiety   . Blood transfusion    "@ birth"  . Cancer ([place])    Head - skin cancer  . Constipation due to pain medication   . Coronary artery disease    DES LAD 2005  . Depression   . Full dentures   . GERD (gastroesophageal reflux disease)   . 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 Heart murmur   . Hyperlipidemia   . Hypertension   . Insomnia   . Osteoarthritis   . PONV (postoperative nausea and vomiting)    2006 - knee surgery  . Pre-diabetes   . Psoriasis   . PTSD (post-traumatic stress disorder)   . Pulmonary embolism ([place]) ~ 03/2006   bilaterally  . Seasonal allergies   . Shortness of breath 06/08/11   "w/exertion; that's why I'm here"  . Sleep apnea, obstructive   . Umbilical hernia       Review of Systems  Constitutional: Negative for chills and fever.  Respiratory: Negative for chest tightness and shortness of breath.   Neurological: Negative for weakness and numbness.  Psychiatric/Behavioral: Negative for confusion, decreased concentration, dysphoric mood and hallucinations. The patient is not nervous/anxious and is not hyperactive.       Objective:    BP 140/72 (BP Location: Left Arm, Patient Position: Sitting, Cuff Size: Large)   Pulse 76   Temp 99.2 F (37.3 C) (Oral)   Resp 18   Ht 5' 5.5" (1.664 m)   Wt 248 lb (112.5 kg)   SpO2 98%   BMI 40.64 kg/m  Nursing note and vital signs reviewed.  Physical Exam  Constitutional: He is oriented to person, place, and time. He appears well-developed and well-nourished. No distress.    Cardiovascular: Normal rate, regular rhythm, normal heart sounds and intact distal pulses.   Pulmonary/Chest: Effort normal and breath sounds normal.  Musculoskeletal:  Bilateral knees - no obvious deformity or discoloration with mild edema noted in the right knee. Postsurgical incisions noted well healed and approximated with no evidence of infection. Range of motion within normal limits. There is generalized tenderness bilaterally over the anterior aspects of the knee. Distal pulses and sensation are intact and appropriate. Ligamentous testing is negative. There is no calf tenderness.  Neurological: He is alert and oriented to person, place, and time.  Skin: Skin is warm and dry.  Psychiatric: He has a normal mood and affect. His behavior is normal. Judgment and thought content  normal.       Assessment & Plan:   Problem List Items Addressed This Visit      Other   Generalized anxiety disorder   Morbid obesity ([place])    BMI of 40. Likely contributing source to his bilateral knee pain. Recommend aggressive weight loss of 5-10% of current body weight through nutrition and physical activity. Recommend dietary intake that is moderate, balance, and varied. Emphasize nutrient dense foods and decreased trans-fats and saturated fats and processed/sugary foods. Not likely candidate for bariatric surgery given multiple comorbidities. Continue to monitor.      Status post total knee replacement - Primary    Status post bilateral knee replacements with increased levels of pain most likely from decreased levels of activity. Recommend conservative treatment including ice and home exercise therapy. Continue current dosage of tramadol with hydrocodone and used only as needed for breakthrough pain. Other nonpharmacological methods of treatment including TENS units and generalized walking discussed. Continue to monitor pending conservative treatment.          I am having Mr. Pao maintain his  rivaroxaban, omeprazole, losartan-hydrochlorothiazide, venlafaxine XR, polyethylene glycol powder, fenofibrate, atorvastatin, tiZANidine, HYDROcodone-acetaminophen, traMADol, and ALPRAZolam.   Meds ordered this encounter  Medications  . ALPRAZolam (XANAX) 0.5 MG tablet    Sig: TAKE 1 TABLET BY MOUTH 3 TIMES A DAY AS NEEDED FOR ANXIETY    Dispense:  90 tablet    Refill:  1    Not to exceed 4 additional fills before 10/17/2016    Order Specific Question:   Supervising Provider    Answer:   Pricilla Holm A [id number]     Follow-up: Return in about 1 month (around 09/12/2016), or if symptoms worsen or fail to improve.  Mauricio Po, FNP

## 2016-08-15 NOTE — Assessment & Plan Note (Signed)
Status post bilateral knee replacements with increased levels of pain most likely from decreased levels of activity. Recommend conservative treatment including ice and home exercise therapy. Continue current dosage of tramadol with hydrocodone and used only as needed for breakthrough pain. Other nonpharmacological methods of treatment including TENS units and generalized walking discussed. Continue to monitor pending conservative treatment.

## 2016-08-15 NOTE — Assessment & Plan Note (Signed)
BMI of 40. Likely contributing source to his bilateral knee pain. Recommend aggressive weight loss of 5-10% of current body weight through nutrition and physical activity. Recommend dietary intake that is moderate, balance, and varied. Emphasize nutrient dense foods and decreased trans-fats and saturated fats and processed/sugary foods. Not likely candidate for bariatric surgery given multiple comorbidities. Continue to monitor.

## 2016-08-21 ENCOUNTER — Other Ambulatory Visit: Payer: Self-pay | Admitting: Family

## 2016-08-22 NOTE — Telephone Encounter (Signed)
Routing to greg, please advise, thanks 

## 2016-09-12 ENCOUNTER — Telehealth: Payer: Self-pay | Admitting: Family

## 2016-09-12 MED ORDER — TRAMADOL HCL 50 MG PO TABS: ORAL_TABLET | ORAL | 0 refills | Status: DC

## 2016-09-12 NOTE — Telephone Encounter (Signed)
Medication has been filled to be faxed.

## 2016-09-12 NOTE — Telephone Encounter (Signed)
Please advise 

## 2016-09-12 NOTE — Telephone Encounter (Signed)
Pt called about his Tramadol refill. He said that he has been giving him a 2 week supply but wanted to know if he could give him a one month supply. Please advise. Thanks [employer]

## 2016-09-13 ENCOUNTER — Other Ambulatory Visit: Payer: Self-pay | Admitting: Family

## 2016-09-13 NOTE — Telephone Encounter (Signed)
Rx faxed

## 2016-09-13 NOTE — Telephone Encounter (Signed)
Routing to greg, please advise, thanks 

## 2016-10-10 ENCOUNTER — Other Ambulatory Visit: Payer: Self-pay | Admitting: Family

## 2016-10-11 NOTE — Telephone Encounter (Signed)
Faxed

## 2016-10-16 ENCOUNTER — Other Ambulatory Visit: Payer: Self-pay | Admitting: Family

## 2016-10-17 NOTE — Telephone Encounter (Signed)
Faxed

## 2016-10-17 NOTE — Telephone Encounter (Signed)
Last refill was 09/13/16 per [place] controlled substance database.

## 2016-11-01 ENCOUNTER — Other Ambulatory Visit: Payer: Self-pay | Admitting: Cardiology

## 2016-11-01 ENCOUNTER — Other Ambulatory Visit: Payer: Self-pay | Admitting: Family

## 2016-11-01 NOTE — Telephone Encounter (Signed)
REFILL 

## 2016-11-15 ENCOUNTER — Other Ambulatory Visit: Payer: Self-pay | Admitting: Family

## 2016-11-16 NOTE — Telephone Encounter (Signed)
Last refill was 10/17/16 per [facility]

## 2016-11-23 ENCOUNTER — Other Ambulatory Visit: Payer: Self-pay | Admitting: Family

## 2016-11-24 ENCOUNTER — Other Ambulatory Visit: Payer: Self-pay | Admitting: Family

## 2016-12-08 ENCOUNTER — Other Ambulatory Visit: Payer: Self-pay | Admitting: Family

## 2016-12-09 NOTE — Telephone Encounter (Signed)
Faxed script back to [facility].../lmb 

## 2016-12-19 NOTE — Progress Notes (Signed)
Aaron [name] Date of Birth: [birth year] Medical Record #[id number]  History of Present Illness: Aaron [name] is seen for follow up today. Has known CAD with stenting of the mid LAD in August of 2005 with Cypher stent for a 70% lesion. Nuclear study had showed evidence of anteroapical ischemia. Myoview in June 2014 was normal. Other issues include HLD, HTN, morbid obesity, depression, GERD, OSA. Has had 2 occurences of pulmonary emboli and is on chronic anticoagulation with Xarelto.  On follow up today he is doing well. He denies  any chest pain, SOB, or palpitations. He is on Xarelto.  He does wear CPAP which works well. He did undergo bilateral TKR this past year. Has some pain and stiffness still on the right. Walking better but still limited.  Current Outpatient Prescriptions on File Prior to Visit  Medication Sig Dispense Refill  . ALPRAZolam (XANAX) 0.5 MG tablet TAKE 1 TABLET BY MOUTH 3 TIMES A DAY AS NEEDED FOR ANXIETY 90 tablet 1  . atorvastatin (LIPITOR) 40 MG tablet Take 1 tablet (40 mg total) by mouth daily. NEED OV. 90 tablet 0  . fenofibrate 160 MG tablet TAKE 1 TABLET BY MOUTH  DAILY 90 tablet 1  . HYDROcodone-acetaminophen (NORCO/VICODIN) 5-325 MG tablet Take 1 tablet by mouth every 12 (twelve) hours as needed for pain.    Marland Kitchen losartan-hydrochlorothiazide (HYZAAR) 100-25 MG tablet TAKE 1 TABLET BY MOUTH  DAILY 90 tablet 1  . omeprazole (PRILOSEC) 20 MG capsule TAKE 1 CAPSULE BY MOUTH  DAILY 90 capsule 1  . polyethylene glycol powder (GLYCOLAX/MIRALAX) powder USING MEASURING CAP MIX  17GM IN WATER AND DRINK  DAILY 1054 g 0  . tiZANidine (ZANAFLEX) 2 MG tablet Take 1 tablet (2 mg total) by mouth every 8 (eight) hours as needed for muscle spasms. 50 tablet 0  . traMADol (ULTRAM) 50 MG tablet TAKE 1 TO 2 TABLETS BY MOUTH EVERY 8 HOURS AS NEEDED 180 tablet 0  . venlafaxine XR (EFFEXOR-XR) 37.5 MG 24 hr capsule TAKE 1 CAPSULE BY MOUTH  DAILY 90 capsule 1  . XARELTO 20 MG TABS tablet TAKE  1 TABLET BY MOUTH  DAILY WITH SUPPER 90 tablet 0   No current facility-administered medications on file prior to visit.     Allergies  Allergen Reactions  . Wellbutrin [Bupropion] Other (See Comments)    Hallucinations, sweating  . Amoxicillin Palpitations    Past Medical History:  Diagnosis Date  . Anemia    "@ birth"  . Anxiety   . Blood transfusion    "@ birth"  . Cancer ([place])    Head - skin cancer  . Constipation due to pain medication   . Coronary artery disease    DES LAD 2005  . Depression   . Full dentures   . GERD (gastroesophageal reflux disease)   . 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 Heart murmur   . Hyperlipidemia   . Hypertension   . Insomnia   . Osteoarthritis   . PONV (postoperative nausea and vomiting)    2006 - knee surgery  . Pre-diabetes   . Psoriasis   . PTSD (post-traumatic stress disorder)   . Pulmonary embolism ([place]) ~ 03/2006   bilaterally  . Seasonal allergies   . Shortness of breath 06/08/11   "w/exertion; that's why I'm here"  . Sleep apnea, obstructive   . Umbilical hernia     Past Surgical History:  Procedure Laterality Date  .  CARDIAC CATHETERIZATION    . carpel tunnel release Bilateral ~ 06/2005/~ 08/2005   right/left  . COLONOSCOPY W/ BIOPSIES AND POLYPECTOMY    . CORONARY ANGIOPLASTY    . CORONARY STENT PLACEMENT  02/13/2004   1  . ENUCLEATION Right    age 71  . EYE SURGERY     age 61; "ruptured  right pupil"  . EYE SURGERY     age 25  . EYE SURGERY  1957   placed artificial right eye  . INGUINAL HERNIA REPAIR Right 1983  . INGUINAL HERNIA REPAIR Left 1985  . KNEE ARTHROSCOPY  ~ 06/2005   right  . MULTIPLE TOOTH EXTRACTIONS    . spinal injections  2006-2007   "maybe 10 injections for herniated discs"  . TONSILLECTOMY     "when I was real young"  . TOTAL KNEE ARTHROPLASTY Left 10/09/2015   Procedure: TOTAL KNEE ARTHROPLASTY;  Surgeon: Dorna Leitz, MD;  Location: [facility];  Service:  Orthopedics;  Laterality: Left;  . TOTAL KNEE ARTHROPLASTY Right 04/04/2016   Procedure: TOTAL KNEE ARTHROPLASTY;  Surgeon: Dorna Leitz, MD;  Location: [facility];  Service: Orthopedics;  Laterality: Right;    History  Smoking Status  . Former Smoker  . Packs/day: 1.50  . Years: 25.00  . Types: Cigarettes  . Quit date: 07/10/1995  Smokeless Tobacco  . Never Used    History  Alcohol Use No    Family History  Problem Relation Age of Onset  . Asthma Father   . Emphysema Father   . Breast cancer Sister   . Liver cancer Sister   . Asthma Sister   . Asthma Brother     Review of Systems: The review of systems is per the HPI.  All other systems were reviewed and are negative.  Physical Exam: BP 128/70   Pulse 69   Ht 5' 5.5" (1.664 m)   Wt 244 lb (110.7 kg)   BMI 39.99 kg/m  Patient is pleasant and in no acute distress. He is  obese.  Skin is warm and dry. Color is normal.  HEENT is unremarkable. Normocephalic/atraumatic. PERRL. Sclera are nonicteric. Neck is supple. No masses. No JVD. Lungs are clear. Cardiac exam shows a regular rate and rhythm. Normal S1-2. No gallop or murmur. Abdomen is soft. Extremities are without edema.  Gait and ROM are intact. No gross neurologic deficits noted.  LABORATORY DATA:   Lab Results  Component Value Date   WBC 9.6 04/06/2016   HGB 9.3 (L) 04/06/2016   HCT 28.3 (L) 04/06/2016   PLT 214 04/06/2016   GLUCOSE 230 (H) 04/05/2016   CHOL 151 02/11/2015   TRIG 114.0 02/11/2015   HDL 41.10 02/11/2015   LDLCALC 87 02/11/2015   ALT 15 (L) 03/25/2016   AST 21 03/25/2016   NA 132 (L) 04/05/2016   K 3.8 04/05/2016   CL 101 04/05/2016   CREATININE 1.11 04/05/2016   BUN 19 04/05/2016   CO2 22 04/05/2016   TSH 4.58 (H) 02/11/2015   PSA 0.58 02/11/2015   INR 1.11 03/25/2016   HGBA1C 6.4 03/08/2016   Ecg today shows NSR rate 69. Normal. I have personally reviewed and interpreted this study.  Assessment / Plan: 1. CAD - s/p stent of the LAD in  2005. No active symptoms. Asymptomatic. Normal Myoview June 2014. Continue medical Rx.  2. HTN - blood pressure is well controlled today. Will continue current medication. Work on lifestyle modification.  3. HLD on therapy.  On  atorvastatin 40 mg daily. Recommend lipid panel be done with primary care.   4. Obesity -  increase aerobic activities.   5. Recurrent PEs. On anticoagulation. Continue Xarelto long term.  6. Osteoarthritis. S/p bilateral TKR.    I will follow up in 6 months.

## 2016-12-20 ENCOUNTER — Other Ambulatory Visit: Payer: Self-pay | Admitting: Family

## 2016-12-21 ENCOUNTER — Ambulatory Visit (INDEPENDENT_AMBULATORY_CARE_PROVIDER_SITE_OTHER): Payer: Medicare Other | Admitting: Cardiology

## 2016-12-21 ENCOUNTER — Encounter: Payer: Self-pay | Admitting: Cardiology

## 2016-12-21 VITALS — BP 128/70 | HR 69 | Ht 65.5 in | Wt 244.0 lb

## 2016-12-21 DIAGNOSIS — I2510 Atherosclerotic heart disease of native coronary artery without angina pectoris: Secondary | ICD-10-CM | POA: Diagnosis not present

## 2016-12-21 DIAGNOSIS — I10 Essential (primary) hypertension: Secondary | ICD-10-CM | POA: Diagnosis not present

## 2016-12-21 DIAGNOSIS — Z7901 Long term (current) use of anticoagulants: Secondary | ICD-10-CM

## 2016-12-21 DIAGNOSIS — E7800 Pure hypercholesterolemia, unspecified: Secondary | ICD-10-CM

## 2016-12-21 NOTE — Patient Instructions (Signed)
You should follow up fasting lab work with primary care  Continue your current therapy  I will see you in 6 months.

## 2016-12-30 ENCOUNTER — Encounter: Payer: Self-pay | Admitting: Family

## 2016-12-30 ENCOUNTER — Ambulatory Visit (INDEPENDENT_AMBULATORY_CARE_PROVIDER_SITE_OTHER): Payer: Medicare Other | Admitting: Family

## 2016-12-30 VITALS — BP 136/70 | HR 68 | Temp 98.3°F | Resp 16 | Ht 65.5 in | Wt 244.0 lb

## 2016-12-30 DIAGNOSIS — I10 Essential (primary) hypertension: Secondary | ICD-10-CM | POA: Diagnosis not present

## 2016-12-30 DIAGNOSIS — F411 Generalized anxiety disorder: Secondary | ICD-10-CM | POA: Diagnosis not present

## 2016-12-30 DIAGNOSIS — E7800 Pure hypercholesterolemia, unspecified: Secondary | ICD-10-CM

## 2016-12-30 MED ORDER — TEMAZEPAM 15 MG PO CAPS: 15.0000 mg | ORAL_CAPSULE | Freq: Every evening | ORAL | 0 refills | Status: DC | PRN

## 2016-12-30 NOTE — Assessment & Plan Note (Signed)
Generalized anxiety disorder appears unlikely controlled with current medication regimen and no adverse side effects. Does continue to have exacerbations secondary to concerns about finances and world current events. Continue current dosage of Effexor and discontinue alprazolam. Start temazepam as needed for sleep. Follow-up in one month or sooner if needed.

## 2016-12-30 NOTE — Assessment & Plan Note (Signed)
Blood pressure well-controlled and below goal 140/90 with current medication regimen and no adverse side effects. Denies worst headache of life with no new symptoms of end organ damage noted on physical exam. Continue current dosage of losartan-hydrochlorothiazide. Encouraged to monitor blood pressure at home and follow low-sodium diet.

## 2016-12-30 NOTE — Patient Instructions (Addendum)
Thank you for choosing [facility].  SUMMARY AND INSTRUCTIONS:  STOP taking the alprazolam. START taking the temazepam.   Please continue to take your medication as prescribed.  Encouraged to monitor your blood pressure at home.   Medication:  Your prescription(s) have been submitted to your pharmacy or been printed and provided for you. Please take as directed and contact our office if you believe you are having problem(s) with the medication(s) or have any questions.  Labs:  Please stop by the lab on the lower level of the building for your blood work. Your results will be released to [facility] (or called to you) after review, usually within 72 hours after test completion. If any changes need to be made, you will be notified at that same time.  1.) The lab is open from 7:30am to 5:30 pm Monday-Friday 2.) No appointment is necessary 3.) Fasting (if needed) is 6-8 hours after food and drink; black coffee and water are okay   Follow up:  If your symptoms worsen or fail to improve, please contact our office for further instruction, or in case of emergency go directly to the emergency room at the closest medical facility.

## 2016-12-30 NOTE — Assessment & Plan Note (Signed)
Hyperlipidemia with no current cardiac symptoms. Obtain lipid profile. Continue current dosage of atorvastatin pending lipid profile results.

## 2016-12-30 NOTE — Progress Notes (Signed)
Subjective:    Patient ID: Aaron [name], male    DOB: 04/02/1946, 71 y.o.   MRN: [id number]  Chief Complaint  Patient presents with  . Follow-up    HPI:  Aaron [name] is a 71 y.o. male who  has a past medical history of Anemia; Anxiety; Blood transfusion; Cancer ([place]); Constipation due to pain medication; Coronary artery disease; Depression; Full dentures; GERD (gastroesophageal reflux disease); Headache(784.0); Heart murmur; Hyperlipidemia; Hypertension; Insomnia; Osteoarthritis; PONV (postoperative nausea and vomiting); Pre-diabetes; Psoriasis; PTSD (post-traumatic stress disorder); Pulmonary embolism ([place]) (~ 03/2006); Seasonal allergies; Shortness of breath (06/08/11); Sleep apnea, obstructive; and Umbilical hernia. and presents today for a follow up office visit.  1.) Hyperlipidemia - Currently maintained on atorvastatin. Reports taking the medication as prescribed and denies adverse side effects or myalgias. No current cardiac symptoms. Working on Medical illustrator.   Lab Results  Component Value Date   CHOL 151 02/11/2015   HDL 41.10 02/11/2015   LDLCALC 87 02/11/2015   TRIG 114.0 02/11/2015   CHOLHDL 4 02/11/2015    2.) Hypertension - Currently maintained on losartan-hctz and reports taking the medications as prescribed and denies adverse side effects or hypotensive readings. Not currently checking blood pressures at home.. Denies changes in vision, worst headache of life or new symptoms of end organ damage. Working on following a low sodium diet.  BP Readings from Last 3 Encounters:  12/30/16 136/70  12/21/16 128/70  08/15/16 140/72    3.) Anxiety - Currently maintained on venlafaxine and alprazolam. Reports taking the medication as prescribed and denies adverse side effects. Symptoms are generally well controlled with the current medication, although does endorse some increased stressors related to world events and his financial situation.    Allergies  Allergen  Reactions  . Wellbutrin [Bupropion] Other (See Comments)    Hallucinations, sweating  . Amoxicillin Palpitations      Outpatient Medications Prior to Visit  Medication Sig Dispense Refill  . atorvastatin (LIPITOR) 40 MG tablet Take 1 tablet (40 mg total) by mouth daily. NEED OV. 90 tablet 0  . fenofibrate 160 MG tablet TAKE 1 TABLET BY MOUTH  DAILY 90 tablet 1  . HYDROcodone-acetaminophen (NORCO/VICODIN) 5-325 MG tablet Take 1 tablet by mouth every 12 (twelve) hours as needed for pain.    Marland Kitchen losartan-hydrochlorothiazide (HYZAAR) 100-25 MG tablet TAKE 1 TABLET BY MOUTH  DAILY 90 tablet 1  . omeprazole (PRILOSEC) 20 MG capsule TAKE 1 CAPSULE BY MOUTH  DAILY 90 capsule 1  . polyethylene glycol powder (GLYCOLAX/MIRALAX) powder USING MEASURING CAP MIX  17GM IN WATER AND DRINK  DAILY 1054 g 0  . tiZANidine (ZANAFLEX) 2 MG tablet Take 1 tablet (2 mg total) by mouth every 8 (eight) hours as needed for muscle spasms. 50 tablet 0  . traMADol (ULTRAM) 50 MG tablet TAKE 1 TO 2 TABLETS BY MOUTH EVERY 8 HOURS AS NEEDED 180 tablet 0  . venlafaxine XR (EFFEXOR-XR) 37.5 MG 24 hr capsule TAKE 1 CAPSULE BY MOUTH  DAILY 90 capsule 1  . XARELTO 20 MG TABS tablet TAKE 1 TABLET BY MOUTH  DAILY WITH SUPPER 90 tablet 0  . ALPRAZolam (XANAX) 0.5 MG tablet TAKE 1 TABLET BY MOUTH 3 TIMES A DAY AS NEEDED FOR ANXIETY 90 tablet 1   No facility-administered medications prior to visit.       Past Surgical History:  Procedure Laterality Date  . CARDIAC CATHETERIZATION    . carpel tunnel release Bilateral ~ 06/2005/~ 08/2005   right/left  .  COLONOSCOPY W/ BIOPSIES AND POLYPECTOMY    . CORONARY ANGIOPLASTY    . CORONARY STENT PLACEMENT  02/13/2004   1  . ENUCLEATION Right    age 63  . EYE SURGERY     age 89; "ruptured  right pupil"  . EYE SURGERY     age 35  . EYE SURGERY  1957   placed artificial right eye  . INGUINAL HERNIA REPAIR Right 1983  . INGUINAL HERNIA REPAIR Left 1985  . KNEE ARTHROSCOPY  ~ 06/2005     right  . MULTIPLE TOOTH EXTRACTIONS    . spinal injections  2006-2007   "maybe 10 injections for herniated discs"  . TONSILLECTOMY     "when I was real young"  . TOTAL KNEE ARTHROPLASTY Left 10/09/2015   Procedure: TOTAL KNEE ARTHROPLASTY;  Surgeon: Dorna Leitz, MD;  Location: [facility];  Service: Orthopedics;  Laterality: Left;  . TOTAL KNEE ARTHROPLASTY Right 04/04/2016   Procedure: TOTAL KNEE ARTHROPLASTY;  Surgeon: Dorna Leitz, MD;  Location: [facility];  Service: Orthopedics;  Laterality: Right;      Past Medical History:  Diagnosis Date  . Anemia    "@ birth"  . Anxiety   . Blood transfusion    "@ birth"  . Cancer ([place])    Head - skin cancer  . Constipation due to pain medication   . Coronary artery disease    DES LAD 2005  . Depression   . Full dentures   . GERD (gastroesophageal reflux disease)   . 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 Heart murmur   . Hyperlipidemia   . Hypertension   . Insomnia   . Osteoarthritis   . PONV (postoperative nausea and vomiting)    2006 - knee surgery  . Pre-diabetes   . Psoriasis   . PTSD (post-traumatic stress disorder)   . Pulmonary embolism ([place]) ~ 03/2006   bilaterally  . Seasonal allergies   . Shortness of breath 06/08/11   "w/exertion; that's why I'm here"  . Sleep apnea, obstructive   . Umbilical hernia       Review of Systems  Constitutional: Negative for chills and fever.  Eyes:       Negative for changes in vision  Respiratory: Negative for cough, chest tightness and wheezing.   Cardiovascular: Negative for chest pain, palpitations and leg swelling.  Neurological: Negative for dizziness, weakness and light-headedness.      Objective:    BP 136/70 (BP Location: Right Arm, Patient Position: Sitting, Cuff Size: Large)   Pulse 68   Temp 98.3 F (36.8 C) (Oral)   Resp 16   Ht 5' 5.5" (1.664 m)   Wt 244 lb (110.7 kg)   SpO2 96%   BMI 39.99 kg/m  Nursing note and vital signs  reviewed.  Physical Exam  Constitutional: He is oriented to person, place, and time. He appears well-developed and well-nourished. No distress.  Cardiovascular: Normal rate, regular rhythm, normal heart sounds and intact distal pulses.   Pulmonary/Chest: Effort normal and breath sounds normal.  Neurological: He is alert and oriented to person, place, and time.  Skin: Skin is warm and dry.  Psychiatric: He has a normal mood and affect. His behavior is normal. Judgment and thought content normal.       Assessment & Plan:   Problem List Items Addressed This Visit      Cardiovascular and Mediastinum   Hypertension    Blood  pressure well-controlled and below goal 140/90 with current medication regimen and no adverse side effects. Denies worst headache of life with no new symptoms of end organ damage noted on physical exam. Continue current dosage of losartan-hydrochlorothiazide. Encouraged to monitor blood pressure at home and follow low-sodium diet.      Relevant Orders   CBC   Comprehensive metabolic panel     Other   Hyperlipidemia - Primary    Hyperlipidemia with no current cardiac symptoms. Obtain lipid profile. Continue current dosage of atorvastatin pending lipid profile results.      Relevant Orders   CBC   Comprehensive metabolic panel   Lipid panel   Generalized anxiety disorder    Generalized anxiety disorder appears unlikely controlled with current medication regimen and no adverse side effects. Does continue to have exacerbations secondary to concerns about finances and world current events. Continue current dosage of Effexor and discontinue alprazolam. Start temazepam as needed for sleep. Follow-up in one month or sooner if needed.          I have discontinued Mr. Tienda ALPRAZolam. I am also having him start on temazepam. Additionally, I am having him maintain his tiZANidine, HYDROcodone-acetaminophen, atorvastatin, venlafaxine XR, fenofibrate, omeprazole,  losartan-hydrochlorothiazide, XARELTO, polyethylene glycol powder, and traMADol.   Meds ordered this encounter  Medications  . temazepam (RESTORIL) 15 MG capsule    Sig: Take 1 capsule (15 mg total) by mouth at bedtime as needed for sleep.    Dispense:  30 capsule    Refill:  0    Order Specific Question:   Supervising Provider    Answer:   Pricilla Holm A [[id number]]     Follow-up: Return in about 3 months (around 04/01/2017), or if symptoms worsen or fail to improve.  Mauricio Po, FNP

## 2017-01-12 ENCOUNTER — Other Ambulatory Visit: Payer: Self-pay | Admitting: Cardiology

## 2017-01-24 ENCOUNTER — Other Ambulatory Visit: Payer: Self-pay | Admitting: Family

## 2017-01-24 NOTE — Telephone Encounter (Signed)
Faxed script back to [facility].../lmb  

## 2017-01-27 ENCOUNTER — Other Ambulatory Visit: Payer: Self-pay | Admitting: Family

## 2017-01-30 ENCOUNTER — Telehealth: Payer: Self-pay | Admitting: Family

## 2017-01-30 MED ORDER — RIVAROXABAN 20 MG PO TABS: ORAL_TABLET | ORAL | 5 refills | Status: DC

## 2017-01-30 NOTE — Telephone Encounter (Signed)
Reviewed chart pt is up-to-date sent refills to [facility]../LMB  

## 2017-01-30 NOTE — Telephone Encounter (Signed)
Pharmacy called requesting one month supply of XARELTO 20 MG TABS tablet  Pt last med fu was 08/2015 [facility] [address]

## 2017-02-07 ENCOUNTER — Other Ambulatory Visit: Payer: Self-pay | Admitting: Family

## 2017-02-07 NOTE — Telephone Encounter (Signed)
I was looking at pts recent office notes from his visit with you on 12/30/16 I noticed during that visit you discontinued his alprazolam and wanted him to start the temazepam. Since his visit on 12/30/16 he has filled an rx for his alprazolam on 01/07/17 as well as filling the temazepam on the day that you prescribed it to him. I am also receiving an rx refill for xanax when the previous one he filled has another refill. Please advise on if he is supposed to be done with this medication completely.

## 2017-02-10 ENCOUNTER — Other Ambulatory Visit (INDEPENDENT_AMBULATORY_CARE_PROVIDER_SITE_OTHER): Payer: Medicare Other

## 2017-02-10 DIAGNOSIS — I10 Essential (primary) hypertension: Secondary | ICD-10-CM | POA: Diagnosis not present

## 2017-02-10 DIAGNOSIS — E7800 Pure hypercholesterolemia, unspecified: Secondary | ICD-10-CM | POA: Diagnosis not present

## 2017-02-10 LAB — COMPREHENSIVE METABOLIC PANEL
ALBUMIN: 4.1 g/dL (ref 3.5–5.2)
ALK PHOS: 57 U/L (ref 39–117)
ALT: 9 U/L (ref 0–53)
AST: 13 U/L (ref 0–37)
BILIRUBIN TOTAL: 0.3 mg/dL (ref 0.2–1.2)
BUN: 20 mg/dL (ref 6–23)
CO2: 31 mEq/L (ref 19–32)
CREATININE: 1.1 mg/dL (ref 0.40–1.50)
Calcium: 9.1 mg/dL (ref 8.4–10.5)
Chloride: 106 mEq/L (ref 96–112)
GFR: 70.1 mL/min (ref >60.00)
Glucose, Bld: 87 mg/dL (ref 70–99)
POTASSIUM: 3.8 meq/L (ref 3.5–5.1)
SODIUM: 142 meq/L (ref 135–145)
TOTAL PROTEIN: 7 g/dL (ref 6.0–8.3)

## 2017-02-10 LAB — LIPID PANEL
CHOLESTEROL: 159 mg/dL (ref 0–200)
HDL: 38.8 mg/dL — ABNORMAL LOW (ref >39.00)
LDL Cholesterol: 94 mg/dL (ref 0–99)
NonHDL: 119.81
Total CHOL/HDL Ratio: 4
Triglycerides: 130 mg/dL (ref 0.0–149.0)
VLDL: 26 mg/dL (ref 0.0–40.0)

## 2017-02-10 LAB — CBC
HEMATOCRIT: 37.4 % — AB (ref 39.0–52.0)
Hemoglobin: 12.5 g/dL — ABNORMAL LOW (ref 13.0–17.0)
MCHC: 33.5 g/dL (ref 30.0–36.0)
MCV: 86 fl (ref 78.0–100.0)
Platelets: 226 10*3/uL (ref 150.0–400.0)
RBC: 4.35 Mil/uL (ref 4.22–5.81)
RDW: 13.6 % (ref 11.5–15.5)
WBC: 5.4 10*3/uL (ref 4.0–10.5)

## 2017-02-10 NOTE — Telephone Encounter (Signed)
Patient has an appointment on Monday Aug 13

## 2017-02-10 NOTE — Telephone Encounter (Signed)
Patient is wanting to know where his RX is for his alprazolam is. Please follow up with patient. Thank you.

## 2017-02-10 NOTE — Telephone Encounter (Signed)
LVM letting pt know that per last office visit he is not taking alprazolam anymore due to being taking off of that medication and being put on temazepam. It is in the office note that alprazolam was discontinued.

## 2017-02-12 ENCOUNTER — Encounter: Payer: Self-pay | Admitting: Family

## 2017-02-13 ENCOUNTER — Encounter: Payer: Self-pay | Admitting: Family

## 2017-02-13 ENCOUNTER — Ambulatory Visit (INDEPENDENT_AMBULATORY_CARE_PROVIDER_SITE_OTHER): Payer: Medicare Other | Admitting: Family

## 2017-02-13 DIAGNOSIS — G4700 Insomnia, unspecified: Secondary | ICD-10-CM

## 2017-02-13 MED ORDER — ALPRAZOLAM 1 MG PO TABS: 1.5000 mg | ORAL_TABLET | Freq: Every evening | ORAL | 1 refills | Status: DC | PRN

## 2017-02-13 NOTE — Patient Instructions (Addendum)
Thank you for choosing [facility].  SUMMARY AND INSTRUCTIONS:  STOP taking the temazepam.  Restart the alprazolam.  Medication:  Your prescription(s) have been submitted to your pharmacy or been printed and provided for you. Please take as directed and contact our office if you believe you are having problem(s) with the medication(s) or have any questions.  Follow up:  If your symptoms worsen or fail to improve, please contact our office for further instruction, or in case of emergency go directly to the emergency room at the closest medical facility.

## 2017-02-13 NOTE — Assessment & Plan Note (Signed)
Insomnia with trial of temazepam being unsuccessful secondary to side effects and patient concerns. Discontinue temazepam. Restart alprazolam. Encourage good sleep hygiene. Follow-up in 3 months or sooner if needed.

## 2017-02-13 NOTE — Progress Notes (Signed)
Subjective:    Patient ID: Aaron [name], male    DOB: 10/17/1945, 71 y.o.   MRN: [id number]  Chief Complaint  Patient presents with  . Medication Management    wants to talk about the temazepam, does not want to take it due to side effects, wants to get back on alprazolam    HPI:  Aaron [name] is a 71 y.o. male who  has a past medical history of Anemia; Anxiety; Blood transfusion; Cancer ([place]); Constipation due to pain medication; Coronary artery disease; Depression; Full dentures; GERD (gastroesophageal reflux disease); Headache(784.0); Heart murmur; Hyperlipidemia; Hypertension; Insomnia; Osteoarthritis; PONV (postoperative nausea and vomiting); Pre-diabetes; Psoriasis; PTSD (post-traumatic stress disorder); Pulmonary embolism ([place]) (~ 03/2006); Seasonal allergies; Shortness of breath (06/08/11); Sleep apnea, obstructive; and Umbilical hernia. and presents today for a follow up office visit.   Anxiety and insomnia - Previously maintained on alprazolam and was recently changed to temazepam. Reports taking the medication as prescribed and does not like the way that it makes him feel and has concerns about taking it with the opioids. Did not have any side effects when taking the alprazolam which he has been taking without problems.   Allergies  Allergen Reactions  . Wellbutrin [Bupropion] Other (See Comments)    Hallucinations, sweating  . Amoxicillin Palpitations      Outpatient Medications Prior to Visit  Medication Sig Dispense Refill  . atorvastatin (LIPITOR) 40 MG tablet TAKE 1 TABLET BY MOUTH  DAILY 90 tablet 1  . fenofibrate 160 MG tablet TAKE 1 TABLET BY MOUTH  DAILY 90 tablet 1  . HYDROcodone-acetaminophen (NORCO/VICODIN) 5-325 MG tablet Take 1 tablet by mouth every 12 (twelve) hours as needed for pain.    Marland Kitchen losartan-hydrochlorothiazide (HYZAAR) 100-25 MG tablet TAKE 1 TABLET BY MOUTH  DAILY 90 tablet 1  . omeprazole (PRILOSEC) 20 MG capsule TAKE 1 CAPSULE BY MOUTH  DAILY  90 capsule 1  . polyethylene glycol powder (GLYCOLAX/MIRALAX) powder USING MEASURING CAP MIX  17GM IN WATER AND DRINK  DAILY 1054 g 0  . rivaroxaban (XARELTO) 20 MG TABS tablet TAKE 1 TABLET BY MOUTH  DAILY WITH SUPPER 30 tablet 5  . tiZANidine (ZANAFLEX) 2 MG tablet Take 1 tablet (2 mg total) by mouth every 8 (eight) hours as needed for muscle spasms. 50 tablet 0  . traMADol (ULTRAM) 50 MG tablet TAKE 1 TO 2 TABLETS BY MOUTH EVERY 8 HOURS AS NEEDED 180 tablet 0  . venlafaxine XR (EFFEXOR-XR) 37.5 MG 24 hr capsule TAKE 1 CAPSULE BY MOUTH  DAILY 90 capsule 1  . temazepam (RESTORIL) 15 MG capsule Take 1 capsule (15 mg total) by mouth at bedtime as needed for sleep. 30 capsule 0   No facility-administered medications prior to visit.       Past Surgical History:  Procedure Laterality Date  . CARDIAC CATHETERIZATION    . carpel tunnel release Bilateral ~ 06/2005/~ 08/2005   right/left  . COLONOSCOPY W/ BIOPSIES AND POLYPECTOMY    . CORONARY ANGIOPLASTY    . CORONARY STENT PLACEMENT  02/13/2004   1  . ENUCLEATION Right    age 58  . EYE SURGERY     age 64; "ruptured  right pupil"  . EYE SURGERY     age 33  . EYE SURGERY  1957   placed artificial right eye  . INGUINAL HERNIA REPAIR Right 1983  . INGUINAL HERNIA REPAIR Left 1985  . KNEE ARTHROSCOPY  ~ 06/2005   right  .  MULTIPLE TOOTH EXTRACTIONS    . spinal injections  2006-2007   "maybe 10 injections for herniated discs"  . TONSILLECTOMY     "when I was real young"  . TOTAL KNEE ARTHROPLASTY Left 10/09/2015   Procedure: TOTAL KNEE ARTHROPLASTY;  Surgeon: Dorna Leitz, MD;  Location: [facility];  Service: Orthopedics;  Laterality: Left;  . TOTAL KNEE ARTHROPLASTY Right 04/04/2016   Procedure: TOTAL KNEE ARTHROPLASTY;  Surgeon: Dorna Leitz, MD;  Location: [facility];  Service: Orthopedics;  Laterality: Right;      Past Medical History:  Diagnosis Date  . Anemia    "@ birth"  . Anxiety   . Blood transfusion    "@ birth"  . Cancer ([place])     Head - skin cancer  . Constipation due to pain medication   . Coronary artery disease    DES LAD 2005  . Depression   . Full dentures   . GERD (gastroesophageal reflux disease)   . 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 Heart murmur   . Hyperlipidemia   . Hypertension   . Insomnia   . Osteoarthritis   . PONV (postoperative nausea and vomiting)    2006 - knee surgery  . Pre-diabetes   . Psoriasis   . PTSD (post-traumatic stress disorder)   . Pulmonary embolism ([place]) ~ 03/2006   bilaterally  . Seasonal allergies   . Shortness of breath 06/08/11   "w/exertion; that's why I'm here"  . Sleep apnea, obstructive   . Umbilical hernia      Review of Systems  Constitutional: Negative for chills and fever.  Respiratory: Negative for chest tightness and shortness of breath.   Cardiovascular: Negative for chest pain, palpitations and leg swelling.  Psychiatric/Behavioral: Positive for sleep disturbance. Negative for agitation, behavioral problems, confusion and dysphoric mood. The patient is nervous/anxious. The patient is not hyperactive.       Objective:    BP (!) 164/82 (BP Location: Left Arm, Patient Position: Sitting, Cuff Size: Large)   Pulse 73   Temp 98.6 F (37 C) (Oral)   Resp 16   Ht 5' 5.5" (1.664 m)   Wt 246 lb (111.6 kg)   SpO2 96%   BMI 40.31 kg/m  Nursing note and vital signs reviewed.  Physical Exam  Constitutional: He is oriented to person, place, and time. He appears well-developed and well-nourished. No distress.  Cardiovascular: Normal rate, regular rhythm, normal heart sounds and intact distal pulses.   Pulmonary/Chest: Effort normal and breath sounds normal.  Neurological: He is alert and oriented to person, place, and time.  Skin: Skin is warm and dry.  Psychiatric: His behavior is normal. Judgment and thought content normal. His mood appears anxious. He does not exhibit a depressed mood.       Assessment & Plan:    Problem List Items Addressed This Visit      Other   Insomnia    Insomnia with trial of temazepam being unsuccessful secondary to side effects and patient concerns. Discontinue temazepam. Restart alprazolam. Encourage good sleep hygiene. Follow-up in 3 months or sooner if needed.          I have discontinued Aaron [name] temazepam. I am also having him start on ALPRAZolam. Additionally, I am having him maintain his tiZANidine, HYDROcodone-acetaminophen, venlafaxine XR, fenofibrate, omeprazole, losartan-hydrochlorothiazide, atorvastatin, traMADol, polyethylene glycol powder, and rivaroxaban.   Meds ordered this encounter  Medications  . ALPRAZolam (XANAX) 1 MG tablet  Sig: Take 1.5 tablets (1.5 mg total) by mouth at bedtime as needed for anxiety or sleep.    Dispense:  45 tablet    Refill:  1    Order Specific Question:   Supervising Provider    Answer:   Pricilla Holm A [[id number]]     Follow-up: Return in about 3 months (around 05/16/2017), or if symptoms worsen or fail to improve.   Mauricio Po, FNP

## 2017-02-16 DIAGNOSIS — Z96651 Presence of right artificial knee joint: Secondary | ICD-10-CM | POA: Diagnosis not present

## 2017-02-16 DIAGNOSIS — M25561 Pain in right knee: Secondary | ICD-10-CM | POA: Diagnosis not present

## 2017-02-16 DIAGNOSIS — Z96652 Presence of left artificial knee joint: Secondary | ICD-10-CM | POA: Diagnosis not present

## 2017-02-16 DIAGNOSIS — M25562 Pain in left knee: Secondary | ICD-10-CM | POA: Diagnosis not present

## 2017-02-28 ENCOUNTER — Other Ambulatory Visit: Payer: Self-pay | Admitting: Family

## 2017-03-01 NOTE — Telephone Encounter (Signed)
Faxed

## 2017-03-01 NOTE — Telephone Encounter (Signed)
Last refill was 01/24/17 per [facility]

## 2017-03-31 ENCOUNTER — Other Ambulatory Visit: Payer: Self-pay | Admitting: Family

## 2017-03-31 NOTE — Telephone Encounter (Signed)
Last refill was 03/01/17 per [facility]

## 2017-03-31 NOTE — Telephone Encounter (Signed)
Rx faxed

## 2017-04-09 ENCOUNTER — Other Ambulatory Visit: Payer: Self-pay | Admitting: Family

## 2017-04-12 ENCOUNTER — Other Ambulatory Visit: Payer: Self-pay | Admitting: Family

## 2017-04-12 NOTE — Telephone Encounter (Signed)
Rx faxed

## 2017-04-12 NOTE — Telephone Encounter (Signed)
Medication refilled

## 2017-04-12 NOTE — Telephone Encounter (Signed)
Rec'd call pt requesting status on refill he states he is out of med.Check [place] registry last filled 03/12/2017.Marland KitchenJohny Chess

## 2017-04-16 ENCOUNTER — Other Ambulatory Visit: Payer: Self-pay | Admitting: Family

## 2017-04-17 DIAGNOSIS — G4733 Obstructive sleep apnea (adult) (pediatric): Secondary | ICD-10-CM | POA: Diagnosis not present

## 2017-04-19 ENCOUNTER — Other Ambulatory Visit: Payer: Self-pay | Admitting: Family

## 2017-04-20 ENCOUNTER — Ambulatory Visit (INDEPENDENT_AMBULATORY_CARE_PROVIDER_SITE_OTHER): Payer: Medicare Other | Admitting: Nurse Practitioner

## 2017-04-20 ENCOUNTER — Encounter: Payer: Self-pay | Admitting: Nurse Practitioner

## 2017-04-20 VITALS — BP 150/78 | HR 71 | Temp 98.3°F | Resp 16 | Ht 65.5 in | Wt 250.0 lb

## 2017-04-20 DIAGNOSIS — I10 Essential (primary) hypertension: Secondary | ICD-10-CM

## 2017-04-20 DIAGNOSIS — R37 Sexual dysfunction, unspecified: Secondary | ICD-10-CM

## 2017-04-20 DIAGNOSIS — Z23 Encounter for immunization: Secondary | ICD-10-CM

## 2017-04-20 MED ORDER — AMLODIPINE BESYLATE 5 MG PO TABS: 5.0000 mg | ORAL_TABLET | Freq: Every day | ORAL | 1 refills | Status: DC

## 2017-04-20 NOTE — Patient Instructions (Addendum)
Call the urologist of your choice to set up an appointment as we discussed.  I sent a prescription for amlodipine for your blood pressure to your pharmacy. Take 1 tablet each night at bedtime.  I'd like to see you back in a couple of weeks to re-check your blood pressure and see how you're doing.  It was nice to see you. Thanks for letting me take care of you today :)

## 2017-04-20 NOTE — Progress Notes (Signed)
Subjective:    Patient ID: Aaron [name], male    DOB: [birth year], 71 y.o.   MRN: [id number]  HPI Aaron [name] is a 71 yo male who presents today to establish care. He  Is transferring to me from another provider in the same clinic.  Hypertension- Managed on hyzaar 10-325 mg daily for several years. Recently he's noticed his blood pressure has been elevated. He is under heavy stress right now due to financial situation and other stressors at home. He feels that the stress and also being in medical settings causes his blood pressure to increase. He denies headaches, vision changes, shortness of breath, chest pain. BP Readings from Last 3 Encounters:  04/20/17 (!) 150/78  02/13/17 (!) 164/82  12/30/16 136/70   Sexual dysfunction- He does not feel interested in sex. This has been an ongoing problem for many years. He reports no erection for 11 years.There is a great amount of stress between him and his current wife related to his loss of interest. His last wife passed away and he often misses her. He had great sexual interest in his current wife when they first got married but this has declined. he also complains that he avoids sex at times due to his chronic Back pain. He's Tried viagra, and testosterone in the past with no improvement. He is not sure if counseling would help.  Review of Systems  See HPI  Past Medical History:  Diagnosis Date  . Anemia    "@ birth"  . Anxiety   . Blood transfusion    "@ birth"  . Cancer ([place])    Head - skin cancer  . Constipation due to pain medication   . Coronary artery disease    DES LAD 2005  . Depression   . Full dentures   . GERD (gastroesophageal reflux disease)   . 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 Heart murmur   . Hyperlipidemia   . Hypertension   . Insomnia   . Osteoarthritis   . PONV (postoperative nausea and vomiting)    2006 - knee surgery  . Pre-diabetes   . Psoriasis   . PTSD (post-traumatic  stress disorder)   . Pulmonary embolism ([place]) ~ 03/2006   bilaterally  . Seasonal allergies   . Shortness of breath 06/08/11   "w/exertion; that's why I'm here"  . Sleep apnea, obstructive   . Umbilical hernia      Social History   Social History  . Marital status: Married    Spouse name: N/A  . Number of children: 3  . Years of education: N/A   Occupational History  . building maintenance     disabled   Social History Main Topics  . Smoking status: Former Smoker    Packs/day: 1.50    Years: 25.00    Types: Cigarettes    Quit date: 07/10/1995  . Smokeless tobacco: Never Used  . Alcohol use No  . Drug use: No  . Sexual activity: Not on file   Other Topics Concern  . 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   Recent stress, currently undergoing financial difficulties.    Past Surgical History:  Procedure Laterality Date  . CARDIAC CATHETERIZATION    . carpel tunnel release Bilateral ~ 06/2005/~ 08/2005   right/left  . COLONOSCOPY W/ BIOPSIES AND POLYPECTOMY    . CORONARY  ANGIOPLASTY    . CORONARY STENT PLACEMENT  02/13/2004   1  . ENUCLEATION Right    age 84  . EYE SURGERY     age 89; "ruptured  right pupil"  . EYE SURGERY     age 66  . EYE SURGERY  1957   placed artificial right eye  . INGUINAL HERNIA REPAIR Right 1983  . INGUINAL HERNIA REPAIR Left 1985  . KNEE ARTHROSCOPY  ~ 06/2005   right  . MULTIPLE TOOTH EXTRACTIONS    . spinal injections  2006-2007   "maybe 10 injections for herniated discs"  . TONSILLECTOMY     "when I was real young"  . TOTAL KNEE ARTHROPLASTY Left 10/09/2015   Procedure: TOTAL KNEE ARTHROPLASTY;  Surgeon: Dorna Leitz, MD;  Location: [facility];  Service: Orthopedics;  Laterality: Left;  . TOTAL KNEE ARTHROPLASTY Right 04/04/2016   Procedure: TOTAL KNEE ARTHROPLASTY;  Surgeon: Dorna Leitz, MD;  Location: [facility];  Service: Orthopedics;  Laterality: Right;    Family History    Problem Relation Age of Onset  . Asthma Father   . Emphysema Father   . Breast cancer Sister   . Liver cancer Sister   . Asthma Sister   . Asthma Brother     Allergies  Allergen Reactions  . Wellbutrin [Bupropion] Other (See Comments)    Hallucinations, sweating  . Amoxicillin Palpitations    Current Outpatient Prescriptions on File Prior to Visit  Medication Sig Dispense Refill  . ALPRAZolam (XANAX) 1 MG tablet TAKE 1 & 1/2 TABLET AT BEDTIME AS NEEDED FOR ANXIETY OR SLEEP 45 tablet 1  . atorvastatin (LIPITOR) 40 MG tablet TAKE 1 TABLET BY MOUTH  DAILY 90 tablet 1  . fenofibrate 160 MG tablet TAKE 1 TABLET BY MOUTH  DAILY 90 tablet 1  . HYDROcodone-acetaminophen (NORCO/VICODIN) 5-325 MG tablet Take 1 tablet by mouth every 12 (twelve) hours as needed for pain.    Marland Kitchen losartan-hydrochlorothiazide (HYZAAR) 100-25 MG tablet TAKE 1 TABLET BY MOUTH  DAILY 90 tablet 1  . omeprazole (PRILOSEC) 20 MG capsule TAKE 1 CAPSULE BY MOUTH  DAILY 90 capsule 1  . polyethylene glycol powder (GLYCOLAX/MIRALAX) powder USING MEASURING CAP MIX  17GM IN WATER AND DRINK  DAILY 1054 g 0  . rivaroxaban (XARELTO) 20 MG TABS tablet TAKE 1 TABLET BY MOUTH  DAILY WITH SUPPER 30 tablet 5  . tiZANidine (ZANAFLEX) 2 MG tablet Take 1 tablet (2 mg total) by mouth every 8 (eight) hours as needed for muscle spasms. 50 tablet 0  . traMADol (ULTRAM) 50 MG tablet TAKE 1 TO 2 TABLETS BY MOUTH EVERY 8 HOURS AS NEEDED FOR PAIN 180 tablet 0  . venlafaxine XR (EFFEXOR-XR) 37.5 MG 24 hr capsule TAKE 1 CAPSULE BY MOUTH  DAILY 90 capsule 1   No current facility-administered medications on file prior to visit.     BP (!) 150/78 (BP Location: Left Arm, Patient Position: Sitting, Cuff Size: Large)   Pulse 71   Temp 98.3 F (36.8 C) (Oral)   Resp 16   Ht 5' 5.5" (1.664 m)   Wt 250 lb (113.4 kg)   SpO2 97%   BMI 40.97 kg/m      Objective:   Physical Exam  Constitutional: He is oriented to person, place, and time. He  appears well-developed and well-nourished.  Cardiovascular: Normal rate, regular rhythm, normal heart sounds and intact distal pulses.   Pulmonary/Chest: Effort normal and breath sounds normal. No respiratory distress.  Musculoskeletal:  He exhibits edema.  Mild LE edema  Neurological: He is alert and oriented to person, place, and time.  Skin: Skin is warm and dry.  Psychiatric: Judgment and thought content normal.  Anxious.       Assessment & Plan:  Flu shot given today. Sexual dysfunction- Suspect related to psychosocial factors. We discussed psychosocial, drug, and physiological causes of sexual dysfunction. He would like to follow up with his urologist. He hesitates to see a counselor but his wife would like to. They'll discuss it and let me know.

## 2017-04-22 NOTE — Assessment & Plan Note (Signed)
Blood pressure elevated above goal 150/90 on last 2 readings. Currently taking hyzaar, reports daily adherence. Will add amlodipine 5mg  daily at bedtime. He denies any headaches, vision changes, chest pain, shortness of breath. He is undergoing several life stressors now, and is not sure when his stress level will decreased. We discussed going to counseling but he'll let me know if hed like a referral. Hell return in 2 weeks for recheck

## 2017-04-27 ENCOUNTER — Other Ambulatory Visit: Payer: Self-pay

## 2017-04-27 ENCOUNTER — Encounter: Payer: Self-pay | Admitting: Cardiology

## 2017-04-27 ENCOUNTER — Ambulatory Visit (INDEPENDENT_AMBULATORY_CARE_PROVIDER_SITE_OTHER): Payer: Medicare Other | Admitting: Cardiology

## 2017-04-27 VITALS — BP 158/70 | HR 70 | Ht 65.5 in | Wt 251.0 lb

## 2017-04-27 DIAGNOSIS — E7800 Pure hypercholesterolemia, unspecified: Secondary | ICD-10-CM | POA: Diagnosis not present

## 2017-04-27 DIAGNOSIS — I824Y9 Acute embolism and thrombosis of unspecified deep veins of unspecified proximal lower extremity: Secondary | ICD-10-CM | POA: Diagnosis not present

## 2017-04-27 DIAGNOSIS — I10 Essential (primary) hypertension: Secondary | ICD-10-CM

## 2017-04-27 DIAGNOSIS — I2510 Atherosclerotic heart disease of native coronary artery without angina pectoris: Secondary | ICD-10-CM | POA: Diagnosis not present

## 2017-04-27 NOTE — Patient Instructions (Signed)
Continue your current therapy  Go ahead and start on amlodipine  I will see you in 6 months

## 2017-04-27 NOTE — Progress Notes (Signed)
Aaron [name] Date of Birth: 10/16/1945 Medical Record #[id number]  History of Present Illness: Aaron [name] is seen for follow up today. Has known CAD with stenting of the mid LAD in August of 2005 with Cypher stent for a 70% lesion. Nuclear study had showed evidence of anteroapical ischemia. Myoview in June 2014 was normal. Other issues include HLD, HTN, morbid obesity, depression, GERD, OSA. Has had 2 occurences of pulmonary emboli and is on chronic anticoagulation with Xarelto.  On follow up today he is doing well. He denies  any chest pain, SOB, or palpitations. He is on Xarelto. He was seen by primary care earlier and prescribed amlodipine for HTN but hasn't started it yet. He is under stress and applying for personal bankruptcy.   Current Outpatient Prescriptions on File Prior to Visit  Medication Sig Dispense Refill  . ALPRAZolam (XANAX) 1 MG tablet TAKE 1 & 1/2 TABLET AT BEDTIME AS NEEDED FOR ANXIETY OR SLEEP 45 tablet 1  . amLODipine (NORVASC) 5 MG tablet Take 1 tablet (5 mg total) by mouth daily. At bedtime 30 tablet 1  . atorvastatin (LIPITOR) 40 MG tablet TAKE 1 TABLET BY MOUTH  DAILY 90 tablet 1  . HYDROcodone-acetaminophen (NORCO/VICODIN) 5-325 MG tablet Take 1 tablet by mouth every 12 (twelve) hours as needed for pain.    Marland Kitchen losartan-hydrochlorothiazide (HYZAAR) 100-25 MG tablet TAKE 1 TABLET BY MOUTH  DAILY 90 tablet 1  . omeprazole (PRILOSEC) 20 MG capsule TAKE 1 CAPSULE BY MOUTH  DAILY 90 capsule 1  . polyethylene glycol powder (GLYCOLAX/MIRALAX) powder USING MEASURING CAP MIX  17GM IN WATER AND DRINK  DAILY 1054 g 0  . rivaroxaban (XARELTO) 20 MG TABS tablet TAKE 1 TABLET BY MOUTH  DAILY WITH SUPPER 30 tablet 5  . tiZANidine (ZANAFLEX) 2 MG tablet Take 1 tablet (2 mg total) by mouth every 8 (eight) hours as needed for muscle spasms. 50 tablet 0  . traMADol (ULTRAM) 50 MG tablet TAKE 1 TO 2 TABLETS BY MOUTH EVERY 8 HOURS AS NEEDED FOR PAIN 180 tablet 0  . venlafaxine XR  (EFFEXOR-XR) 37.5 MG 24 hr capsule TAKE 1 CAPSULE BY MOUTH  DAILY 90 capsule 1   No current facility-administered medications on file prior to visit.     Allergies  Allergen Reactions  . Wellbutrin [Bupropion] Other (See Comments)    Hallucinations, sweating  . Amoxicillin Palpitations    Past Medical History:  Diagnosis Date  . Anemia    "@ birth"  . Anxiety   . Blood transfusion    "@ birth"  . Cancer ([place])    Head - skin cancer  . Constipation due to pain medication   . Coronary artery disease    DES LAD 2005  . Depression   . Full dentures   . GERD (gastroesophageal reflux disease)   . 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 Heart murmur   . Hyperlipidemia   . Hypertension   . Insomnia   . Osteoarthritis   . PONV (postoperative nausea and vomiting)    2006 - knee surgery  . Pre-diabetes   . Psoriasis   . PTSD (post-traumatic stress disorder)   . Pulmonary embolism ([place]) ~ 03/2006   bilaterally  . Seasonal allergies   . Shortness of breath 06/08/11   "w/exertion; that's why I'm here"  . Sleep apnea, obstructive   . Umbilical hernia     Past Surgical History:  Procedure Laterality  Date  . CARDIAC CATHETERIZATION    . carpel tunnel release Bilateral ~ 06/2005/~ 08/2005   right/left  . COLONOSCOPY W/ BIOPSIES AND POLYPECTOMY    . CORONARY ANGIOPLASTY    . CORONARY STENT PLACEMENT  02/13/2004   1  . ENUCLEATION Right    age 71  . EYE SURGERY     age 71; "ruptured  right pupil"  . EYE SURGERY     age 71  . EYE SURGERY  1957   placed artificial right eye  . INGUINAL HERNIA REPAIR Right 1983  . INGUINAL HERNIA REPAIR Left 1985  . KNEE ARTHROSCOPY  ~ 06/2005   right  . MULTIPLE TOOTH EXTRACTIONS    . spinal injections  2006-2007   "maybe 10 injections for herniated discs"  . TONSILLECTOMY     "when I was real young"  . TOTAL KNEE ARTHROPLASTY Left 10/09/2015   Procedure: TOTAL KNEE ARTHROPLASTY;  Surgeon: Dorna Leitz, MD;   Location: [facility];  Service: Orthopedics;  Laterality: Left;  . TOTAL KNEE ARTHROPLASTY Right 04/04/2016   Procedure: TOTAL KNEE ARTHROPLASTY;  Surgeon: Dorna Leitz, MD;  Location: [facility];  Service: Orthopedics;  Laterality: Right;    History  Smoking Status  . Former Smoker  . Packs/day: 1.50  . Years: 25.00  . Types: Cigarettes  . Quit date: 07/10/1995  Smokeless Tobacco  . Never Used    History  Alcohol Use No    Family History  Problem Relation Age of Onset  . Asthma Father   . Emphysema Father   . Breast cancer Sister   . Liver cancer Sister   . Asthma Sister   . Asthma Brother     Review of Systems: The review of systems is per the HPI.  All other systems were reviewed and are negative.  Physical Exam: BP (!) 158/70   Pulse 70   Ht 5' 5.5" (1.664 m)   Wt 251 lb (113.9 kg)   BMI 41.13 kg/m  GENERAL:  Well appearing obese WM in NAD HEENT:  PERRL, EOMI, sclera are clear. Oropharynx is clear. NECK:  No jugular venous distention, carotid upstroke brisk and symmetric, no bruits, no thyromegaly or adenopathy LUNGS:  Clear to auscultation bilaterally CHEST:  Unremarkable HEART:  RRR,  PMI not displaced or sustained,S1 and S2 within normal limits, no S3, no S4: no clicks, no rubs, no murmurs ABD:  Soft, obese, nontender. BS +, no masses or bruits. No hepatomegaly, no splenomegaly EXT:  2 + pulses throughout, no edema, no cyanosis no clubbing.  SKIN:  Warm and dry.  No rashes NEURO:  Alert and oriented x 3. Cranial nerves II through XII intact. PSYCH:  Cognitively intact    LABORATORY DATA:   Lab Results  Component Value Date   WBC 5.4 02/10/2017   HGB 12.5 (L) 02/10/2017   HCT 37.4 (L) 02/10/2017   PLT 226.0 02/10/2017   GLUCOSE 87 02/10/2017   CHOL 159 02/10/2017   TRIG 130.0 02/10/2017   HDL 38.80 (L) 02/10/2017   LDLCALC 94 02/10/2017   ALT 9 02/10/2017   AST 13 02/10/2017   NA 142 02/10/2017   K 3.8 02/10/2017   CL 106 02/10/2017   CREATININE 1.10  02/10/2017   BUN 20 02/10/2017   CO2 31 02/10/2017   TSH 4.58 (H) 02/11/2015   PSA 0.58 02/11/2015   INR 1.11 03/25/2016   HGBA1C 6.4 03/08/2016    Assessment / Plan: 1. CAD - s/p stent of the LAD  in 2005.  Asymptomatic. Normal Myoview June 2014. Continue medical Rx.  2. HTN - blood pressure is elevated today. Will continue current medication. I have recommended he go ahead and start amlodipine 5 mg daily.   3. HLD on therapy.  On atorvastatin 40 mg daily.   4. Obesity -  Encourage weight loss.  5. Recurrent PEs. On anticoagulation. Continue Xarelto long term.  6. Osteoarthritis. S/p bilateral TKR.    I will follow up in 6 months.

## 2017-04-27 NOTE — Telephone Encounter (Signed)
Received [facility] from [facility] [address] for refill on tramadol. Last refill was 03/31/17 per [facility].

## 2017-04-28 MED ORDER — TRAMADOL HCL 50 MG PO TABS: 50.0000 mg | ORAL_TABLET | Freq: Three times a day (TID) | ORAL | 0 refills | Status: DC | PRN

## 2017-04-28 NOTE — Telephone Encounter (Signed)
rx printed and signed

## 2017-04-28 NOTE — Telephone Encounter (Signed)
[facility]ed [facility] [address]

## 2017-05-04 ENCOUNTER — Ambulatory Visit: Payer: Medicare Other | Admitting: Nurse Practitioner

## 2017-05-10 ENCOUNTER — Encounter: Payer: Self-pay | Admitting: Nurse Practitioner

## 2017-05-10 ENCOUNTER — Ambulatory Visit: Payer: Medicare Other | Admitting: Nurse Practitioner

## 2017-05-10 VITALS — BP 138/64 | HR 70 | Temp 98.4°F | Resp 16 | Ht 65.5 in | Wt 252.0 lb

## 2017-05-10 DIAGNOSIS — I10 Essential (primary) hypertension: Secondary | ICD-10-CM

## 2017-05-10 MED ORDER — AMLODIPINE BESYLATE 5 MG PO TABS: 5.0000 mg | ORAL_TABLET | Freq: Every day | ORAL | 3 refills | Status: DC

## 2017-05-10 NOTE — Patient Instructions (Addendum)
I am glad to see your blood pressure improving with the addition of the amlodipine.  I would like for you to continue the amlodipine and losartan-HCTZ daily. Remember healthy diet and exercise will help your blood pressure as well.  Return at your convenience for an annual physical as we discussed.  It was good to see you. Thanks for letting me take care of you today :)

## 2017-05-10 NOTE — Progress Notes (Addendum)
Subjective:    Patient ID: Aaron [name], male    DOB: July 07, 1945, 71 y.o.   MRN: [id number]  HPI Aaron [name] is a 71 yo male who presents today for a follow up visit.   Hypertension- he's had elevated readings over the past several months. He has been maintained on hyzaar for some time. Amlodipine 5mg  daily at bedtime was added to his medication regimen on his last visit. He reports he got the medication filled about 2 weeks ago and has been taking it daily since. He denies chest pain, vision, headaches, shortness of breath, edema.  BP Readings from Last 3 Encounters:  05/10/17 138/64  04/27/17 (!) 158/70  04/20/17 (!) 150/78    Review of Systems  See HPI  Past Medical History:  Diagnosis Date  . Anemia    "@ birth"  . Anxiety   . Blood transfusion    "@ birth"  . Cancer ([place])    Head - skin cancer  . Constipation due to pain medication   . Coronary artery disease    DES LAD 2005  . Depression   . Full dentures   . GERD (gastroesophageal reflux disease)   . 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 Heart murmur   . Hyperlipidemia   . Hypertension   . Insomnia   . Osteoarthritis   . PONV (postoperative nausea and vomiting)    2006 - knee surgery  . Pre-diabetes   . Psoriasis   . PTSD (post-traumatic stress disorder)   . Pulmonary embolism ([place]) ~ 03/2006   bilaterally  . Seasonal allergies   . Shortness of breath 06/08/11   "w/exertion; that's why I'm here"  . Sleep apnea, obstructive   . Umbilical hernia      Social History   Socioeconomic History  . Marital status: Married    Spouse name: Not on file  . Number of children: 3  . Years of education: Not on file  . Highest education level: Not on file  Social Needs  . Financial resource strain: Not on file  . Food insecurity - worry: Not on file  . Food insecurity - inability: Not on file  . Transportation needs - medical: Not on file  . Transportation needs - non-medical: Not  on file  Occupational History  . Occupation: building maintenance    Comment: disabled  Tobacco Use  . Smoking status: Former Smoker    Packs/day: 1.50    Years: 25.00    Pack years: 37.50    Types: Cigarettes    Last attempt to quit: 07/10/1995    Years since quitting: 21.8  . Smokeless tobacco: Never Used  Substance and Sexual Activity  . Alcohol use: No  . Drug use: No  . Sexual activity: Not on file  Other Topics Concern  . 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   Recent stress, currently undergoing financial difficulties.    Past Surgical History:  Procedure Laterality Date  . CARDIAC CATHETERIZATION    . carpel tunnel release Bilateral ~ 06/2005/~ 08/2005   right/left  . COLONOSCOPY W/ BIOPSIES AND POLYPECTOMY    . CORONARY ANGIOPLASTY    . CORONARY STENT PLACEMENT  02/13/2004   1  . ENUCLEATION Right    age 55  . EYE SURGERY     age 70; "ruptured  right pupil"  . EYE SURGERY  age 60  . [place]   placed artificial right eye  . INGUINAL HERNIA REPAIR Right 1983  . INGUINAL HERNIA REPAIR Left 1985  . KNEE ARTHROSCOPY  ~ 06/2005   right  . MULTIPLE TOOTH EXTRACTIONS    . spinal injections  2006-2007   "maybe 10 injections for herniated discs"  . TONSILLECTOMY     "when I was real young"    Family History  Problem Relation Age of Onset  . Asthma Father   . Emphysema Father   . Breast cancer Sister   . Liver cancer Sister   . Asthma Sister   . Asthma Brother     Allergies  Allergen Reactions  . Wellbutrin [Bupropion] Other (See Comments)    Hallucinations, sweating  . Amoxicillin Palpitations    Current Outpatient Medications on File Prior to Visit  Medication Sig Dispense Refill  . ALPRAZolam (XANAX) 1 MG tablet TAKE 1 & 1/2 TABLET AT BEDTIME AS NEEDED FOR ANXIETY OR SLEEP 45 tablet 1  . amLODipine (NORVASC) 5 MG tablet Take 1 tablet (5 mg total) by mouth daily. At bedtime  30 tablet 1  . atorvastatin (LIPITOR) 40 MG tablet TAKE 1 TABLET BY MOUTH  DAILY 90 tablet 1  . fenofibrate 160 MG tablet Take 160 mg by mouth daily.    Marland Kitchen HYDROcodone-acetaminophen (NORCO/VICODIN) 5-325 MG tablet Take 1 tablet by mouth every 12 (twelve) hours as needed for pain.    Marland Kitchen losartan-hydrochlorothiazide (HYZAAR) 100-25 MG tablet TAKE 1 TABLET BY MOUTH  DAILY 90 tablet 1  . omeprazole (PRILOSEC) 20 MG capsule TAKE 1 CAPSULE BY MOUTH  DAILY 90 capsule 1  . polyethylene glycol powder (GLYCOLAX/MIRALAX) powder USING MEASURING CAP MIX  17GM IN WATER AND DRINK  DAILY 1054 g 0  . rivaroxaban (XARELTO) 20 MG TABS tablet TAKE 1 TABLET BY MOUTH  DAILY WITH SUPPER 30 tablet 5  . tiZANidine (ZANAFLEX) 2 MG tablet Take 1 tablet (2 mg total) by mouth every 8 (eight) hours as needed for muscle spasms. 50 tablet 0  . traMADol (ULTRAM) 50 MG tablet Take 1-2 tablets (50-100 mg total) by mouth every 8 (eight) hours as needed. for pain 180 tablet 0  . venlafaxine XR (EFFEXOR-XR) 37.5 MG 24 hr capsule TAKE 1 CAPSULE BY MOUTH  DAILY 90 capsule 1   No current facility-administered medications on file prior to visit.     BP 138/64 (BP Location: Left Arm, Patient Position: Sitting, Cuff Size: Large)   Pulse 70   Temp 98.4 F (36.9 C) (Oral)   Resp 16   Ht 5' 5.5" (1.664 m)   Wt 252 lb (114.3 kg)   SpO2 97%   BMI 41.30 kg/m       Objective:   Physical Exam  Constitutional: He is oriented to person, place, and time. He appears well-developed and well-nourished. No distress.  HENT:  Head: Normocephalic and atraumatic.  Cardiovascular: Normal rate, regular rhythm, normal heart sounds and intact distal pulses.  Pulmonary/Chest: Effort normal and breath sounds normal.  Musculoskeletal: He exhibits no edema.  Neurological: He is alert and oriented to person, place, and time. Coordination normal.  Skin: Skin is warm and dry.  Psychiatric: He has a normal mood and affect. Judgment and thought content  normal.      Assessment & Plan:   He will return at his convenience for an annual physical.

## 2017-05-10 NOTE — Assessment & Plan Note (Signed)
Essential hypertension Blood pressure below goal of 140/90 at today's visit. Maintained on hyzaar for some time, and started on amlopine 5mg  daily at bedtime at last visit for elevated BP readings above goal. Denies adverse medication effects. Denies headaches, vision changes, chest pain, shortness of breath, edema. Will continue to maintain BP on hyzaar and amlodipine. He does not routinely check blood pressure at home. Encouraged healthy, reduced sodium diet and regular exercise.

## 2017-05-23 DIAGNOSIS — M545 Low back pain: Secondary | ICD-10-CM | POA: Diagnosis not present

## 2017-06-07 ENCOUNTER — Other Ambulatory Visit: Payer: Self-pay | Admitting: *Deleted

## 2017-06-07 ENCOUNTER — Other Ambulatory Visit: Payer: Self-pay | Admitting: Nurse Practitioner

## 2017-06-07 MED ORDER — FENOFIBRATE 160 MG PO TABS: 160.0000 mg | ORAL_TABLET | Freq: Every day | ORAL | 1 refills | Status: DC

## 2017-06-07 MED ORDER — OMEPRAZOLE 20 MG PO CPDR: 20.0000 mg | DELAYED_RELEASE_CAPSULE | Freq: Every day | ORAL | 1 refills | Status: DC

## 2017-06-07 NOTE — Telephone Encounter (Signed)
1st omeprazole script printed refax through epic.Marland KitchenJohny [name]

## 2017-06-07 NOTE — Addendum Note (Signed)
Addended by: Earnstine Regal on: 06/07/2017 01:15 PM   Modules accepted: Orders

## 2017-06-08 ENCOUNTER — Telehealth: Payer: Self-pay

## 2017-06-08 ENCOUNTER — Other Ambulatory Visit: Payer: Self-pay | Admitting: Nurse Practitioner

## 2017-06-08 ENCOUNTER — Other Ambulatory Visit: Payer: Self-pay | Admitting: Family

## 2017-06-08 MED ORDER — TRAMADOL HCL 50 MG PO TABS: 50.0000 mg | ORAL_TABLET | Freq: Three times a day (TID) | ORAL | 0 refills | Status: DC | PRN

## 2017-06-08 NOTE — Telephone Encounter (Signed)
[facility] [address] [facility] requested refill for tramadol. Last refill per database was 04/28/17 of a quantity of 180 per 30 days. Please advise if you are ok to refill this medication.

## 2017-06-13 ENCOUNTER — Telehealth: Payer: Self-pay | Admitting: Nurse Practitioner

## 2017-06-13 NOTE — Telephone Encounter (Signed)
Copied from [place] [id number]. Topic: Quick Communication - See Telephone Encounter >> Jun 13, 2017  4:20 PM Boyd Kerbs wrote: CRM for notification. See Telephone encounter for:  [id number] [place], [place] [address]  He also needs Polyephylene glycolipower  send to [place], [address] [place] [id number]   06/13/17.

## 2017-06-14 ENCOUNTER — Other Ambulatory Visit: Payer: Self-pay

## 2017-06-14 ENCOUNTER — Other Ambulatory Visit: Payer: Self-pay | Admitting: Nurse Practitioner

## 2017-06-14 MED ORDER — POLYETHYLENE GLYCOL 3350 17 GM/SCOOP PO POWD: ORAL | 1 refills | Status: DC

## 2017-06-14 MED ORDER — ALPRAZOLAM 1 MG PO TABS: 1.5000 mg | ORAL_TABLET | Freq: Every evening | ORAL | 0 refills | Status: DC | PRN

## 2017-06-14 NOTE — Telephone Encounter (Signed)
Notified pt rx has been sent to your local pharmacy.../lmb  

## 2017-06-14 NOTE — Telephone Encounter (Signed)
Request for controlled substance 

## 2017-06-20 ENCOUNTER — Telehealth: Payer: Self-pay

## 2017-06-20 DIAGNOSIS — Z961 Presence of intraocular lens: Secondary | ICD-10-CM | POA: Diagnosis not present

## 2017-06-20 DIAGNOSIS — Z96651 Presence of right artificial knee joint: Secondary | ICD-10-CM | POA: Diagnosis not present

## 2017-06-20 DIAGNOSIS — M25561 Pain in right knee: Secondary | ICD-10-CM | POA: Diagnosis not present

## 2017-06-20 DIAGNOSIS — M25562 Pain in left knee: Secondary | ICD-10-CM | POA: Diagnosis not present

## 2017-06-20 DIAGNOSIS — H02002 Unspecified entropion of right lower eyelid: Secondary | ICD-10-CM | POA: Diagnosis not present

## 2017-06-20 DIAGNOSIS — H40012 Open angle with borderline findings, low risk, left eye: Secondary | ICD-10-CM | POA: Diagnosis not present

## 2017-06-20 DIAGNOSIS — Z970 Presence of artificial eye: Secondary | ICD-10-CM | POA: Diagnosis not present

## 2017-06-20 DIAGNOSIS — Z96652 Presence of left artificial knee joint: Secondary | ICD-10-CM | POA: Diagnosis not present

## 2017-06-20 NOTE — Telephone Encounter (Signed)
Copied from [place] [id number]. Topic: General - Other >> Jun 20, 2017  9:31 AM Lolita Rieger, RMA wrote: Reason for CRM: Elta Guadeloupe from [facility] called and needs and NPI number for [facility] Please call him at [phone] with this info

## 2017-06-21 NOTE — Telephone Encounter (Signed)
NPI number has been faxed over.

## 2017-07-09 ENCOUNTER — Other Ambulatory Visit: Payer: Self-pay | Admitting: Nurse Practitioner

## 2017-07-12 NOTE — Telephone Encounter (Signed)
Copied from [place]. Topic: Quick Communication - See Telephone Encounter >> Jul 12, 2017 11:40 AM Ahmed Prima L wrote: CRM for notification. See Telephone encounter for:   07/12/17.  [facility] request on 1/6 & 1/8 for traMADol (ULTRAM) 50 MG tablet [id number] [place], [place] - [address] AT [place]

## 2017-07-12 NOTE — Telephone Encounter (Signed)
Check [place] registry last filled 06/08/2017...Johny Chess

## 2017-07-12 NOTE — Telephone Encounter (Signed)
Route to a cma.

## 2017-07-13 ENCOUNTER — Other Ambulatory Visit: Payer: Self-pay | Admitting: Nurse Practitioner

## 2017-07-17 NOTE — Telephone Encounter (Signed)
This is not a [facility] Patient.

## 2017-07-17 NOTE — Telephone Encounter (Signed)
Copied from [place]. Topic: General - Other >> Jul 17, 2017 11:51 AM Neva Seat wrote: Alprazolam 1mg  45 tabs Pt has been with out for 2 days -has tried to get it filled since last week.  [facility] [address] - [phone]

## 2017-07-18 NOTE — Telephone Encounter (Signed)
Copied from [place] [phone]. Topic: Quick Communication - Rx Refill/Question >> Jul 18, 2017 10:40 AM Patrice Paradise wrote: Medication:  ALPRAZolam Duanne Moron) 1 MG tablet   Has the patient contacted their pharmacy? Yes.     (Agent: If no, request that the patient contact the pharmacy for the refill.)   Preferred Pharmacy (with phone number or street name):   [id number] [place], [place] [address] [place] [address] Phone: [phone] Fax: [phone]     Agent: Please be advised that RX refills may take up to 3 business days. We ask that you follow-up with your pharmacy.

## 2017-07-18 NOTE — Telephone Encounter (Signed)
See request for refill on Alprazolam.

## 2017-07-18 NOTE — Telephone Encounter (Signed)
Check  registry last filled 06/14/2017.Marland KitchenJohny [name]

## 2017-07-19 NOTE — Telephone Encounter (Signed)
Aaron [name] pls advise on med refill for alprazolam. Check [place] registry last filled 06/14/2017...Aaron [name]

## 2017-07-19 NOTE — Telephone Encounter (Signed)
Aaron [name], I sent refill for quantity 30 1mg  tablets. He can take 1mg  each night at bedtime. This is a decrease from 1.5 tablets at bedtime that he has been taking. I will decrease next month to 1/2 a tablet at bedtime then we will stop the medication. I asked him to come in for an appointment to discuss this but I do not see that he has made one.

## 2017-07-19 NOTE — Telephone Encounter (Signed)
Patient said he still does not have the script at the pharmacy & he is currently out. He said last filled 06/14/17

## 2017-07-20 ENCOUNTER — Telehealth: Payer: Self-pay | Admitting: Nurse Practitioner

## 2017-07-20 NOTE — Telephone Encounter (Signed)
Copied from [place] [id number]. Topic: General - Other >> Jul 20, 2017  4:27 PM Neva Seat wrote: Alprazolam  1 mg    Pt needs to discuss his dosage of the Rx.  He currently taking 1 1/2 at bedtime.

## 2017-07-20 NOTE — Telephone Encounter (Signed)
Left message for pt to return call to the office to discuss medication.  

## 2017-07-20 NOTE — Telephone Encounter (Signed)
Called pt no answer LMOM w/Ashleigh response../lmb 

## 2017-08-01 DIAGNOSIS — M25562 Pain in left knee: Secondary | ICD-10-CM | POA: Diagnosis not present

## 2017-08-01 DIAGNOSIS — M25561 Pain in right knee: Secondary | ICD-10-CM | POA: Diagnosis not present

## 2017-08-02 ENCOUNTER — Ambulatory Visit: Payer: Medicare Other | Admitting: Pulmonary Disease

## 2017-08-09 ENCOUNTER — Other Ambulatory Visit: Payer: Self-pay | Admitting: Nurse Practitioner

## 2017-08-10 NOTE — Telephone Encounter (Signed)
Last refill was 07/12/17 per database

## 2017-08-14 ENCOUNTER — Telehealth: Payer: Self-pay | Admitting: Nurse Practitioner

## 2017-08-14 NOTE — Telephone Encounter (Signed)
Copied from [place]. Topic: Appointment Scheduling - Prior Auth Required for Appointment >> Aug 14, 2017  2:57 PM Vernona Rieger wrote: Patient is requesting to see his PCP shambley for a medication check. He said this is urgent. Please call if he can be worked in or see someone else. Call back is [phone]   Called and spoke with patient and informed him he would have to make an appointment to get a refill. He was informed back in Dec 2018 to make an appointment to get anymore refills. Patient refused to set up an appointment. Patients wife got on the phone as well, yelling and ranting about why couldn't any doctor give him his controlled medication. After speaking with [facility], I informing them again they need to make an appointment, they again refused. And stated things about [facility]la [facility]us that they did not like. I informed them he had the choose to transfer his care to another office if they liked. They did not like that answer, stated they only said the things about her and the office because he was upset. They asked to speak with the office doctor over her that could over ride her. I informed it did not work like that, they would need an appointment to speak with her about getting more refills. Patient refused to set up an appointment and just keep demanding an a refill. I explained again the only way I could help at this point was to set up an appointment. Again they refused and said they will take this up with someone else.

## 2017-08-17 ENCOUNTER — Ambulatory Visit: Payer: Medicare Other | Admitting: Pulmonary Disease

## 2017-08-17 ENCOUNTER — Encounter: Payer: Self-pay | Admitting: Pulmonary Disease

## 2017-08-17 VITALS — BP 138/74 | HR 88 | Ht 60.0 in | Wt 252.0 lb

## 2017-08-17 DIAGNOSIS — G4733 Obstructive sleep apnea (adult) (pediatric): Secondary | ICD-10-CM

## 2017-08-17 DIAGNOSIS — F419 Anxiety disorder, unspecified: Secondary | ICD-10-CM

## 2017-08-17 MED ORDER — ALPRAZOLAM 1 MG PO TABS: 1.0000 mg | ORAL_TABLET | Freq: Every evening | ORAL | 0 refills | Status: DC | PRN

## 2017-08-17 NOTE — Progress Notes (Signed)
[facility]  Chief Complaint  Patient presents with  . Follow-up    Pt is having hard time falling asleep and staying asleep longer than 2 hours. Pt is having aniexty attacks more frenquent in last 2 weeks.    Vital signs: BP 138/74 (BP Location: Left Arm, Cuff Size: Normal)   Pulse 88   Ht 5' (1.524 m)   Wt 252 lb (114.3 kg)   SpO2 98%   BMI 49.22 kg/m   History of Present Illness: Aaron [name] is a 72 y.o. male obstructive sleep apnea.  He has been using CPAP.  No issues with mask.  He was going through bankruptcy and was very anxious.  This is finally settled.    He has been using xanax to help sleep.  He is out of his script.  He was told that he needed f/u first with PCP before they would refill.  He is going to [place] and isn't sure he can make appointment with PCP.   Physical Exam:  General - pleasant Eyes - pupils reactive ENT - no sinus tenderness, no oral exudate, no LAN Cardiac - regular, no murmur Chest - no wheeze, rales Abd - soft, non tender Ext - no edema Skin - no rashes Neuro - normal strength Psych - normal mood   Assessment/Plan:  Obstructive sleep apnea. - he is compliant with CPAP and reports benefit from therapy - continue auto CPAP  Anxiety. - will refill his xanax for now and advised him to f/u with PCP for further refills   Patient Instructions  Follow up in 1 year    [name], MD [place] 08/17/2017, 3:41 PM Pager:  [phone]  Flow Sheet  Pulmonary tests: 06/08/11 CT chest >> b/l PE, ATX, LUL 77mm nodule  12/11/11 CT chest >> LUL nodule resolved, but new b/l nodules up to 8 mm  06/20/12 CT chest >> no change  06/20/13 CT chest >> no change  Sleep tests: PSG 09/22/13 >> AHI 5, RDI 61.4, SaO2 low 90%. Auto CPAP 07/17/17 to 08/15/17 >> used on 30 of 30 nights with average 9 hrs 54 min.  Average AHI 0.4 with median CPAP 10 and 95 th percentile CPAP 14 cm  H2O  Past Medical History: He  has a past medical history of Anemia, Anxiety, Blood transfusion, Cancer ([place]), Constipation due to pain medication, Coronary artery disease, Depression, Full dentures, GERD (gastroesophageal reflux disease), Headache(784.0), Heart murmur, Hyperlipidemia, Hypertension, Insomnia, Osteoarthritis, PONV (postoperative nausea and vomiting), Pre-diabetes, Psoriasis, PTSD (post-traumatic stress disorder), Pulmonary embolism ([place]) (~ 03/2006), Seasonal allergies, Shortness of breath (06/08/11), Sleep apnea, obstructive, and Umbilical hernia.  Past Surgical History: He  has a past surgical history that includes spinal injections (2006-2007); carpel tunnel release (Bilateral, ~ 06/2005/~ 08/2005); Coronary stent placement (02/13/2004); Inguinal hernia repair (Right, 1983); Inguinal hernia repair (Left, 1985); Tonsillectomy; Eye surgery; Eye surgery; Enucleation (Right); Eye surgery [phone]); Cardiac catheterization; Knee arthroscopy (~ 06/2005); Coronary angioplasty; Total knee arthroplasty (Left, 10/09/2015); Colonoscopy w/ biopsies and polypectomy; Multiple tooth extractions; and Total knee arthroplasty (Right, 04/04/2016).  Family History: His family history includes Asthma in his brother, father, and sister; Breast cancer in his sister; Emphysema in his father; Liver cancer in his sister.  Social History: He  reports that he quit smoking about 22 years ago. His smoking use included cigarettes. He has a 37.50 pack-year smoking history. he has never used smokeless tobacco. He reports that he does not drink alcohol or  use drugs.  Medications: Allergies as of 08/17/2017      Reactions   Wellbutrin [bupropion] Other (See Comments)   Hallucinations, sweating   Amoxicillin Palpitations      Medication List        Accurate as of 08/17/17  3:41 PM. Always use your most recent med list.          ALPRAZolam 1 MG tablet Commonly known as:  XANAX Take 1 tablet (1 mg total) by mouth  at bedtime as needed for anxiety or sleep.   amLODipine 5 MG tablet Commonly known as:  NORVASC Take 1 tablet (5 mg total) daily by mouth. At bedtime   atorvastatin 40 MG tablet Commonly known as:  LIPITOR TAKE 1 TABLET BY MOUTH  DAILY   fenofibrate 160 MG tablet Take 1 tablet (160 mg total) by mouth daily.   losartan-hydrochlorothiazide 100-25 MG tablet Commonly known as:  HYZAAR TAKE 1 TABLET BY MOUTH  DAILY   omeprazole 20 MG capsule Commonly known as:  PRILOSEC Take 1 capsule (20 mg total) by mouth daily.   polyethylene glycol powder powder Commonly known as:  GLYCOLAX/MIRALAX USING MEASURING CAP MIX  17GM IN WATER AND DRINK  DAILY   polyethylene glycol powder powder Commonly known as:  GLYCOLAX/MIRALAX USING MEASURING CAP MIX  17GM IN WATER AND DRINK  DAILY   rivaroxaban 20 MG Tabs tablet Commonly known as:  XARELTO TAKE 1 TABLET BY MOUTH  DAILY WITH SUPPER   traMADol 50 MG tablet Commonly known as:  ULTRAM TAKE 1-2 TABLETS (50-100 MG TOTAL) BY MOUTH EVERY 8 (EIGHT) HOURS AS NEEDED. FOR PAIN   venlafaxine XR 37.5 MG 24 hr capsule Commonly known as:  EFFEXOR-XR TAKE 1 CAPSULE BY MOUTH  DAILY

## 2017-08-17 NOTE — Patient Instructions (Signed)
Follow up in 1 year.

## 2017-08-21 ENCOUNTER — Ambulatory Visit: Payer: Medicare Other | Admitting: Pulmonary Disease

## 2017-08-27 ENCOUNTER — Other Ambulatory Visit: Payer: Self-pay | Admitting: Cardiology

## 2017-08-28 ENCOUNTER — Other Ambulatory Visit: Payer: Self-pay

## 2017-08-28 MED ORDER — RIVAROXABAN 20 MG PO TABS: ORAL_TABLET | ORAL | 1 refills | Status: DC

## 2017-08-28 MED ORDER — LOSARTAN POTASSIUM-HCTZ 100-25 MG PO TABS: 1.0000 | ORAL_TABLET | Freq: Every day | ORAL | 1 refills | Status: DC

## 2017-08-28 MED ORDER — VENLAFAXINE HCL ER 37.5 MG PO CP24: 37.5000 mg | ORAL_CAPSULE | Freq: Every day | ORAL | 1 refills | Status: DC

## 2017-09-26 ENCOUNTER — Encounter: Payer: Self-pay | Admitting: Family Medicine

## 2017-09-26 ENCOUNTER — Ambulatory Visit: Payer: Medicare Other | Admitting: Family Medicine

## 2017-09-26 DIAGNOSIS — J069 Acute upper respiratory infection, unspecified: Secondary | ICD-10-CM | POA: Insufficient documentation

## 2017-09-26 NOTE — Patient Instructions (Signed)
Please try things such as zyrtec or allegra which is an antihistamine and decongestant.   Please try afrin which will help with nasal congestion but use for only three days.   Please also try using a netti pot on a regular occasion.  Honey can help with a sore throat.   Vick's and Delsym can help with cough

## 2017-09-26 NOTE — Progress Notes (Signed)
Aaron [name] - 72 y.o. male MRN [id number]  Date of birth: 06/24/1946  SUBJECTIVE:  Including CC & ROS.  Chief Complaint  Patient presents with  . Cough    Aaron [name] is a 73 y.o. male that is presenting with a cough. Ongoing for three days. He just returned from a trip to [place]. Denies fever or body aches. Admits to productive cough with some mucous. He uses a CPAP machine to sleep at night, cough is worse at night. He has been taking mucinex with no improvement. He has a history of blood clots and is on xarelto. No body aches.     Review of Systems  Constitutional: Negative for fever.  HENT: Positive for congestion.   Respiratory: Positive for cough.   Cardiovascular: Negative for chest pain.    HISTORY: Past Medical, Surgical, Social, and Family History Reviewed & Updated per EMR.   Pertinent Historical Findings include:  Past Medical History:  Diagnosis Date  . Anemia    "@ birth"  . Anxiety   . Blood transfusion    "@ birth"  . Cancer ([place])    Head - skin cancer  . Constipation due to pain medication   . Coronary artery disease    DES LAD 2005  . Depression   . Full dentures   . GERD (gastroesophageal reflux disease)   . 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 Heart murmur   . Hyperlipidemia   . Hypertension   . Insomnia   . Osteoarthritis   . PONV (postoperative nausea and vomiting)    2006 - knee surgery  . Pre-diabetes   . Psoriasis   . PTSD (post-traumatic stress disorder)   . Pulmonary embolism ([place]) ~ 03/2006   bilaterally  . Seasonal allergies   . Shortness of breath 06/08/11   "w/exertion; that's why I'm here"  . Sleep apnea, obstructive   . Umbilical hernia     Past Surgical History:  Procedure Laterality Date  . CARDIAC CATHETERIZATION    . carpel tunnel release Bilateral ~ 06/2005/~ 08/2005   right/left  . COLONOSCOPY W/ BIOPSIES AND POLYPECTOMY    . CORONARY ANGIOPLASTY    . CORONARY STENT PLACEMENT   02/13/2004   1  . ENUCLEATION Right    age 36  . EYE SURGERY     age 70; "ruptured  right pupil"  . EYE SURGERY     age 59  . EYE SURGERY  1957   placed artificial right eye  . INGUINAL HERNIA REPAIR Right 1983  . INGUINAL HERNIA REPAIR Left 1985  . KNEE ARTHROSCOPY  ~ 06/2005   right  . MULTIPLE TOOTH EXTRACTIONS    . spinal injections  2006-2007   "maybe 10 injections for herniated discs"  . TONSILLECTOMY     "when I was real young"  . TOTAL KNEE ARTHROPLASTY Left 10/09/2015   Procedure: TOTAL KNEE ARTHROPLASTY;  Surgeon: Dorna Leitz, MD;  Location: [facility];  Service: Orthopedics;  Laterality: Left;  . TOTAL KNEE ARTHROPLASTY Right 04/04/2016   Procedure: TOTAL KNEE ARTHROPLASTY;  Surgeon: Dorna Leitz, MD;  Location: [facility];  Service: Orthopedics;  Laterality: Right;    Allergies  Allergen Reactions  . Wellbutrin [Bupropion] Other (See Comments)    Hallucinations, sweating  . Amoxicillin Palpitations    Family History  Problem Relation Age of Onset  . Asthma Father   . Emphysema Father   . Breast cancer Sister   .  Liver cancer Sister   . Asthma Sister   . Asthma Brother      Social History   Socioeconomic History  . Marital status: Married    Spouse name: Not on file  . Number of children: 3  . Years of education: Not on file  . Highest education level: Not on file  Occupational History  . Occupation: building maintenance    Comment: disabled  Social Needs  . Financial resource strain: Not on file  . Food insecurity:    Worry: Not on file    Inability: Not on file  . Transportation needs:    Medical: Not on file    Non-medical: Not on file  Tobacco Use  . Smoking status: Former Smoker    Packs/day: 1.50    Years: 25.00    Pack years: 37.50    Types: Cigarettes    Last attempt to quit: 07/10/1995    Years since quitting: 22.2  . Smokeless tobacco: Never Used  Substance and Sexual Activity  . Alcohol use: No  . Drug use: No  . Sexual activity: Not on  file  Lifestyle  . Physical activity:    Days per week: Not on file    Minutes per session: Not on file  . Stress: Not on file  Relationships  . Social connections:    Talks on phone: Not on file    Gets together: Not on file    Attends religious service: Not on file    Active member of club or organization: Not on file    Attends meetings of clubs or organizations: Not on file    Relationship status: Not on file  . Intimate partner violence:    Fear of current or ex partner: Not on file    Emotionally abused: Not on file    Physically abused: Not on file    Forced sexual activity: Not on file  Other Topics Concern  . 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   Recent stress, currently undergoing financial difficulties.     PHYSICAL EXAM:  VS: BP (!) 148/78 (BP Location: Left Arm, Patient Position: Sitting, Cuff Size: Normal)   Pulse 78   Temp 97.9 F (36.6 C) (Oral)   Ht 5' (1.524 m)   Wt 249 lb (112.9 kg)   SpO2 98%   BMI 48.63 kg/m  Physical Exam Gen: NAD, alert, cooperative with exam,  ENT: normal lips, normal nasal mucosa, tympanic membranes clear and intact bilaterally, normal oropharynx, no cervical lymphadenopathy Eye: , normal conjunctiva and lids CV:  no edema, +2 pedal pulses, regular rate and rhythm, S1-S2   Resp: no accessory muscle use, non-labored, clear to auscultation bilaterally, no crackles or wheezes Skin: no rashes, no areas of induration  Neuro: normal tone, normal sensation to touch Psych:  normal insight, alert and oriented MSK: Normal gait, normal strength       ASSESSMENT & PLAN:   URI (upper respiratory infection) Likely viral in nature with the recent travel. On anti-coagulation  - counseled on supportive care  - given indications to follow up.

## 2017-09-26 NOTE — Assessment & Plan Note (Signed)
Likely viral in nature with the recent travel. On anti-coagulation  - counseled on supportive care  - given indications to follow up.

## 2017-10-04 ENCOUNTER — Encounter: Payer: Self-pay | Admitting: Family

## 2017-10-04 ENCOUNTER — Ambulatory Visit: Payer: Medicare Other | Admitting: Family

## 2017-10-04 VITALS — BP 142/74 | HR 81 | Temp 98.5°F | Ht 60.0 in | Wt 249.1 lb

## 2017-10-04 DIAGNOSIS — M1711 Unilateral primary osteoarthritis, right knee: Secondary | ICD-10-CM

## 2017-10-04 DIAGNOSIS — G4700 Insomnia, unspecified: Secondary | ICD-10-CM | POA: Diagnosis not present

## 2017-10-04 DIAGNOSIS — M1712 Unilateral primary osteoarthritis, left knee: Secondary | ICD-10-CM

## 2017-10-04 MED ORDER — TRAMADOL HCL 50 MG PO TABS: 50.0000 mg | ORAL_TABLET | Freq: Three times a day (TID) | ORAL | 0 refills | Status: DC | PRN

## 2017-10-04 MED ORDER — ALPRAZOLAM 1 MG PO TABS: 1.0000 mg | ORAL_TABLET | Freq: Every evening | ORAL | 0 refills | Status: DC | PRN

## 2017-10-04 NOTE — Progress Notes (Signed)
Aaron [name] is a 72 y.o. male with the following history as recorded in EpicCare:  Patient Active Problem List   Diagnosis Date Noted  . URI (upper respiratory infection) 09/26/2017  . Status post total knee replacement 08/15/2016  . Morbid obesity ([place]) 08/01/2016  . Primary osteoarthritis of right knee 04/04/2016  . DVT (deep venous thrombosis) ([place]) 11/09/2015  . Primary osteoarthritis of left knee 10/09/2015  . Generalized anxiety disorder 08/20/2015  . Routine general medical examination at a health care facility 02/11/2015  . Medicare annual wellness visit, subsequent 02/11/2015  . Long term current use of anticoagulant 06/14/2011  . Coronary artery disease   . Hypertension   . Hyperlipidemia   . Depression   . GERD (gastroesophageal reflux disease)   . Insomnia   . Sleep apnea, obstructive   . Pulmonary embolism ([place])     Current Outpatient Medications  Medication Sig Dispense Refill  . ALPRAZolam (XANAX) 1 MG tablet Take 1 tablet (1 mg total) by mouth at bedtime as needed for anxiety or sleep. 30 tablet 0  . amLODipine (NORVASC) 5 MG tablet Take 1 tablet (5 mg total) daily by mouth. At bedtime 90 tablet 3  . atorvastatin (LIPITOR) 40 MG tablet TAKE 1 TABLET BY MOUTH  DAILY 90 tablet 2  . fenofibrate 160 MG tablet Take 1 tablet (160 mg total) by mouth daily. 90 tablet 1  . HYDROcodone-acetaminophen (NORCO/VICODIN) 5-325 MG tablet Take 1 tablet by mouth every 6 (six) hours as needed for moderate pain.    Marland Kitchen losartan-hydrochlorothiazide (HYZAAR) 100-25 MG tablet Take 1 tablet by mouth daily. 90 tablet 1  . omeprazole (PRILOSEC) 20 MG capsule Take 1 capsule (20 mg total) by mouth daily. 90 capsule 1  . polyethylene glycol powder (GLYCOLAX/MIRALAX) powder USING MEASURING CAP MIX  17GM IN WATER AND DRINK  DAILY 1054 g 0  . rivaroxaban (XARELTO) 20 MG TABS tablet TAKE 1 TABLET BY MOUTH  DAILY WITH SUPPER 90 tablet 1  . traMADol (ULTRAM) 50 MG tablet Take 1-2 tablets (50-100 mg  total) by mouth every 8 (eight) hours as needed. for pain 180 tablet 0  . venlafaxine XR (EFFEXOR-XR) 37.5 MG 24 hr capsule Take 1 capsule (37.5 mg total) by mouth daily. 90 capsule 1   No current facility-administered medications for this visit.     Allergies: Wellbutrin [bupropion] and Amoxicillin  Past Medical History:  Diagnosis Date  . Anemia    "@ birth"  . Anxiety   . Blood transfusion    "@ birth"  . Cancer ([place])    Head - skin cancer  . Constipation due to pain medication   . Coronary artery disease    DES LAD 2005  . Depression   . Full dentures   . GERD (gastroesophageal reflux disease)   . 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 Heart murmur   . Hyperlipidemia   . Hypertension   . Insomnia   . Osteoarthritis   . PONV (postoperative nausea and vomiting)    2006 - knee surgery  . Pre-diabetes   . Psoriasis   . PTSD (post-traumatic stress disorder)   . Pulmonary embolism ([place]) ~ 03/2006   bilaterally  . Seasonal allergies   . Shortness of breath 06/08/11   "w/exertion; that's why I'm here"  . Sleep apnea, obstructive   . Umbilical hernia     Past Surgical History:  Procedure Laterality Date  . CARDIAC CATHETERIZATION    .  carpel tunnel release Bilateral ~ 06/2005/~ 08/2005   right/left  . COLONOSCOPY W/ BIOPSIES AND POLYPECTOMY    . CORONARY ANGIOPLASTY    . CORONARY STENT PLACEMENT  02/13/2004   1  . ENUCLEATION Right    age 81  . EYE SURGERY     age 31; "ruptured  right pupil"  . EYE SURGERY     age 24  . EYE SURGERY  1957   placed artificial right eye  . INGUINAL HERNIA REPAIR Right 1983  . INGUINAL HERNIA REPAIR Left 1985  . KNEE ARTHROSCOPY  ~ 06/2005   right  . MULTIPLE TOOTH EXTRACTIONS    . spinal injections  2006-2007   "maybe 10 injections for herniated discs"  . TONSILLECTOMY     "when I was real young"  . TOTAL KNEE ARTHROPLASTY Left 10/09/2015   Procedure: TOTAL KNEE ARTHROPLASTY;  Surgeon: Dorna Leitz, MD;   Location: [facility];  Service: Orthopedics;  Laterality: Left;  . TOTAL KNEE ARTHROPLASTY Right 04/04/2016   Procedure: TOTAL KNEE ARTHROPLASTY;  Surgeon: Dorna Leitz, MD;  Location: [facility];  Service: Orthopedics;  Laterality: Right;    Family History  Problem Relation Age of Onset  . Asthma Father   . Emphysema Father   . Breast cancer Sister   . Liver cancer Sister   . Asthma Sister   . Asthma Brother     Social History   Tobacco Use  . Smoking status: Former Smoker    Packs/day: 1.50    Years: 25.00    Pack years: 37.50    Types: Cigarettes    Last attempt to quit: 07/10/1995    Years since quitting: 22.2  . Smokeless tobacco: Never Used  Substance Use Topics  . Alcohol use: No    Subjective:  Patient presents requesting refills on medication refills today:  1) using Xanax at night to help him sleep; has been stable on this medication for years; last prescription was written by his pulmonologist as he could not get into see his PCP before prescription expired; documentation by pulmonologist is consistent with patient's history today;  2) needs refill on Tramadol as well; has been on this dosage for years; takes 100 mg tid ( #180/ month);     Objective:  Vitals:   10/04/17 1506  BP: (!) 142/74  Pulse: 81  Temp: 98.5 F (36.9 C)  TempSrc: Oral  SpO2: 97%  Weight: 249 lb 1.3 oz (113 kg)  Height: 5' (1.524 m)    General: Well developed, well nourished, in no acute distress  Skin : Warm and dry.  Lungs: Respirations unlabored; clear to auscultation bilaterally without wheeze, rales, rhonchi  CVS exam: normal rate and regular rhythm.  Neurologic: Alert and oriented; speech intact; face symmetrical; moves all extremities well; CNII-XII intact without focal deficit   Assessment:  1. Insomnia, unspecified type   2. Primary osteoarthritis of left knee   3. Primary osteoarthritis of right knee     Plan:  Refills updated x 1 month; will let his PCP update UDS at next  OV;  Encouraged patient to purchase a home cuff and start checking blood pressure regularly; known white coat hypertension.   No follow-ups on file.  No orders of the defined types were placed in this encounter.   Requested Prescriptions   Signed Prescriptions Disp Refills  . ALPRAZolam (XANAX) 1 MG tablet 30 tablet 0    Sig: Take 1 tablet (1 mg total) by mouth at bedtime as needed  for anxiety or sleep.  . traMADol (ULTRAM) 50 MG tablet 180 tablet 0    Sig: Take 1-2 tablets (50-100 mg total) by mouth every 8 (eight) hours as needed. for pain

## 2017-10-17 ENCOUNTER — Other Ambulatory Visit: Payer: Self-pay | Admitting: Nurse Practitioner

## 2017-10-31 ENCOUNTER — Other Ambulatory Visit: Payer: Self-pay | Admitting: Family

## 2017-11-03 ENCOUNTER — Other Ambulatory Visit: Payer: Self-pay | Admitting: Family

## 2017-11-03 MED ORDER — TRAMADOL HCL 50 MG PO TABS: 50.0000 mg | ORAL_TABLET | Freq: Three times a day (TID) | ORAL | 0 refills | Status: DC | PRN

## 2017-11-03 MED ORDER — ALPRAZOLAM 1 MG PO TABS: 1.0000 mg | ORAL_TABLET | Freq: Every evening | ORAL | 0 refills | Status: DC | PRN

## 2017-11-03 NOTE — Telephone Encounter (Signed)
I will refill but he must schedule OV to get updated UDS; no further refills to be given without this.

## 2017-11-03 NOTE — Telephone Encounter (Signed)
Patient Checking status, please advise Call back [phone]

## 2017-11-03 NOTE — Telephone Encounter (Signed)
Check [place] registry last filled both on 10/04/2017. Hollie Beach is out of the office until next tues. pls advise on refill.Marland KitchenJohny Chess

## 2017-11-06 NOTE — Telephone Encounter (Signed)
Called pt no answer LMOM w/MD response../lmb 

## 2017-11-06 NOTE — Telephone Encounter (Signed)
Pt called back made appt for tomorrow to see Ashleigh and discuss getting UDS.Marland KitchenJohny Chess

## 2017-11-08 ENCOUNTER — Other Ambulatory Visit: Payer: Medicare Other

## 2017-11-08 ENCOUNTER — Encounter: Payer: Self-pay | Admitting: Nurse Practitioner

## 2017-11-08 ENCOUNTER — Ambulatory Visit: Payer: Medicare Other | Admitting: Nurse Practitioner

## 2017-11-08 VITALS — BP 138/60 | HR 64 | Temp 98.4°F | Resp 18 | Ht 65.5 in | Wt 254.0 lb

## 2017-11-08 DIAGNOSIS — F329 Major depressive disorder, single episode, unspecified: Secondary | ICD-10-CM

## 2017-11-08 DIAGNOSIS — G8929 Other chronic pain: Secondary | ICD-10-CM

## 2017-11-08 DIAGNOSIS — M545 Low back pain: Secondary | ICD-10-CM | POA: Diagnosis not present

## 2017-11-08 DIAGNOSIS — F411 Generalized anxiety disorder: Secondary | ICD-10-CM

## 2017-11-08 DIAGNOSIS — Z79899 Other long term (current) drug therapy: Secondary | ICD-10-CM

## 2017-11-08 DIAGNOSIS — F32A Depression, unspecified: Secondary | ICD-10-CM

## 2017-11-08 NOTE — Progress Notes (Signed)
Name: Aaron [name]   MRN: [id number]    DOB: [birth year]   Date:11/26/2017       Progress Note  Subjective  Chief Complaint  Chief Complaint  Patient presents with  . Follow-up    UDS,     HPI He is here today for F/U of back pain and anxiety and depression.  Back pain and arthritis- This is a chronic problem for him. He describes as chronic aching to lower back. The pain occurs throughout the day and is worsened by certain movements or when he lies flat to sleep at night He has been to Physical therapy, orthopedic and spine specialist and received spinal injections in the past but continues to have chronic pain. Maintained on tramadol 1-2 tablets TID pRN for some time, which he says Is the only treatment that has helped his pain  Depression and anxiety- He is maintained on effexor 37.5 daily and xanax 1mg  at bedtime which he reports provides good control of his mood He denies any adverse medication effects including drowsiness, dizziness, fatigue, sedation He denies any thoughts of hurting himself or others   Patient Active Problem List   Diagnosis Date Noted  . URI (upper respiratory infection) 09/26/2017  . Status post total knee replacement 08/15/2016  . Morbid obesity ([place]) 08/01/2016  . Primary osteoarthritis of right knee 04/04/2016  . DVT (deep venous thrombosis) ([place]) 11/09/2015  . Primary osteoarthritis of left knee 10/09/2015  . Generalized anxiety disorder 08/20/2015  . Routine general medical examination at a health care facility 02/11/2015  . Medicare annual wellness visit, subsequent 02/11/2015  . Long term current use of anticoagulant 06/14/2011  . Coronary artery disease   . Hypertension   . Hyperlipidemia   . Depression   . GERD (gastroesophageal reflux disease)   . Insomnia   . Sleep apnea, obstructive   . Pulmonary embolism [facility])     Past Surgical History:  Procedure Laterality Date  . CARDIAC CATHETERIZATION    . carpel tunnel release Bilateral  ~ 06/2005/~ 08/2005   right/left  . COLONOSCOPY W/ BIOPSIES AND POLYPECTOMY    . CORONARY ANGIOPLASTY    . CORONARY STENT PLACEMENT  02/13/2004   1  . ENUCLEATION Right    age 71  . EYE SURGERY     age 79; "ruptured  right pupil"  . EYE SURGERY     age 45  . EYE SURGERY  1957   placed artificial right eye  . INGUINAL HERNIA REPAIR Right 1983  . INGUINAL HERNIA REPAIR Left 1985  . KNEE ARTHROSCOPY  ~ 06/2005   right  . MULTIPLE TOOTH EXTRACTIONS    . spinal injections  2006-2007   "maybe 10 injections for herniated discs"  . TONSILLECTOMY     "when I was real young"  . TOTAL KNEE ARTHROPLASTY Left 10/09/2015   Procedure: TOTAL KNEE ARTHROPLASTY;  Surgeon: Dorna Leitz, MD;  Location: [place];  Service: Orthopedics;  Laterality: Left;  . TOTAL KNEE ARTHROPLASTY Right 04/04/2016   Procedure: TOTAL KNEE ARTHROPLASTY;  Surgeon: Dorna Leitz, MD;  Location: [place];  Service: Orthopedics;  Laterality: Right;    Family History  Problem Relation Age of Onset  . Asthma Father   . Emphysema Father   . Breast cancer Sister   . Liver cancer Sister   . Asthma Sister   . Asthma Brother     Social History   Socioeconomic History  . Marital status: Married    Spouse name: Not on  file  . Number of children: 3  . Years of education: Not on file  . Highest education level: Not on file  Occupational History  . Occupation: building maintenance    Comment: disabled  Social Needs  . Financial resource strain: Not on file  . Food insecurity:    Worry: Not on file    Inability: Not on file  . Transportation needs:    Medical: Not on file    Non-medical: Not on file  Tobacco Use  . Smoking status: Former Smoker    Packs/day: 1.50    Years: 25.00    Pack years: 37.50    Types: Cigarettes    Last attempt to quit: 07/10/1995    Years since quitting: 22.3  . Smokeless tobacco: Never Used  Substance and Sexual Activity  . Alcohol use: No  . Drug use: No  . Sexual activity: Not on file   Lifestyle  . Physical activity:    Days per week: Not on file    Minutes per session: Not on file  . Stress: Not on file  Relationships  . Social connections:    Talks on phone: Not on file    Gets together: Not on file    Attends religious service: Not on file    Active member of club or organization: Not on file    Attends meetings of clubs or organizations: Not on file    Relationship status: Not on file  . Intimate partner violence:    Fear of current or ex partner: Not on file    Emotionally abused: Not on file    Physically abused: Not on file    Forced sexual activity: Not on file  Other Topics Concern  . 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   Recent stress, currently undergoing financial difficulties.     Current Outpatient Medications:  .  ALPRAZolam (XANAX) 1 MG tablet, Take 1 tablet (1 mg total) by mouth at bedtime as needed for anxiety or sleep., Disp: 30 tablet, Rfl: 0 .  amLODipine (NORVASC) 5 MG tablet, Take 1 tablet (5 mg total) daily by mouth. At bedtime, Disp: 90 tablet, Rfl: 3 .  atorvastatin (LIPITOR) 40 MG tablet, TAKE 1 TABLET BY MOUTH  DAILY, Disp: 90 tablet, Rfl: 2 .  fenofibrate 160 MG tablet, TAKE 1 TABLET BY MOUTH  DAILY, Disp: 90 tablet, Rfl: 1 .  HYDROcodone-acetaminophen (NORCO/VICODIN) 5-325 MG tablet, Take 1 tablet by mouth every 12 (twelve) hours. , Disp: , Rfl:  .  losartan-hydrochlorothiazide (HYZAAR) 100-25 MG tablet, Take 1 tablet by mouth daily., Disp: 90 tablet, Rfl: 1 .  omeprazole (PRILOSEC) 20 MG capsule, TAKE 1 CAPSULE BY MOUTH  DAILY, Disp: 90 capsule, Rfl: 1 .  polyethylene glycol powder (GLYCOLAX/MIRALAX) powder, USING MEASURING CAP MIX  17GM IN WATER AND DRINK  DAILY, Disp: 1054 g, Rfl: 0 .  rivaroxaban (XARELTO) 20 MG TABS tablet, TAKE 1 TABLET BY MOUTH  DAILY WITH SUPPER, Disp: 90 tablet, Rfl: 1 .  traMADol (ULTRAM) 50 MG tablet, Take 1-2 tablets (50-100 mg total) by  mouth every 8 (eight) hours as needed. for pain, Disp: 180 tablet, Rfl: 0 .  venlafaxine XR (EFFEXOR-XR) 37.5 MG 24 hr capsule, Take 1 capsule (37.5 mg total) by mouth daily., Disp: 90 capsule, Rfl: 1  Allergies  Allergen Reactions  . Wellbutrin [Bupropion] Other (See Comments)    Hallucinations, sweating  . Amoxicillin Palpitations  ROS See HPI  Objective  Vitals:   11/08/17 1042 11/08/17 1138  BP: (!) 148/62 138/60  Pulse: 64   Resp: 18   Temp: 98.4 F (36.9 C)   TempSrc: Oral   SpO2: 98%   Weight: 254 lb (115.2 kg)   Height: 5' 5.5" (1.664 m)    Body mass index is 41.62 kg/m.  Physical Exam Vial signs reviewed. Constitutional: Patient appears well-developed and well-nourished. No distress.  HENT: Head: Normocephalic and atraumatic. Nose: Nose normal. Mouth/Throat: Oropharynx is clear and moist. No oropharyngeal exudate.  Eyes: Conjunctivae and EOM are normal. Pupils are equal, round, and reactive to light. No scleral icterus.  Neck: Normal range of motion. Neck supple.  Cardiovascular: Normal rate, regular rhythm and normal heart sounds.  No murmur heard. No BLE edema. Distal pulses intact. Pulmonary/Chest: Effort normal and breath sounds normal. No respiratory distress. Musculoskeletal: No gross deformities Neurological: he is alert and oriented to person, place, and time. Coordination, balance, strength, speech and gait are normal.  Skin: Skin is warm and dry. No rash noted. No erythema.  Psychiatric: Patient has a normal mood and affect. behavior is normal.   Assessment & Plan RTC for CPE  High risk medication use - Pain Mgmt, Profile 8 w/Conf, U; Future

## 2017-11-08 NOTE — Patient Instructions (Signed)
Please head downstairs for lab work/x-rays. If any of your test results are critically abnormal, you will be contacted right away. Your results may be released to your MyChart for viewing before I am able to provide you with my response. I will contact you within a week about your test results and any recommendations for abnormalities.  Please return in 2-3 months for an annual physical.

## 2017-11-11 LAB — PAIN MGMT, PROFILE 8 W/CONF, U
6 Acetylmorphine: NEGATIVE ng/mL (ref <10)
ALCOHOL METABOLITES: NEGATIVE ng/mL (ref <500)
ALPHAHYDROXYALPRAZOLAM: 83 ng/mL — AB (ref <25)
AMINOCLONAZEPAM: NEGATIVE ng/mL (ref <25)
Alphahydroxymidazolam: NEGATIVE ng/mL (ref <50)
Alphahydroxytriazolam: NEGATIVE ng/mL (ref <50)
Amphetamines: NEGATIVE ng/mL (ref <500)
Benzodiazepines: POSITIVE ng/mL — AB (ref <100)
Buprenorphine, Urine: NEGATIVE ng/mL (ref <5)
CREATININE: 63.7 mg/dL
Cocaine Metabolite: NEGATIVE ng/mL (ref <150)
Codeine: NEGATIVE ng/mL (ref <50)
HYDROMORPHONE: NEGATIVE ng/mL (ref <50)
Hydrocodone: 146 ng/mL — ABNORMAL HIGH (ref <50)
Hydroxyethylflurazepam: NEGATIVE ng/mL (ref <50)
Lorazepam: NEGATIVE ng/mL (ref <50)
MDA: NEGATIVE ng/mL (ref <200)
MDMA: NEGATIVE ng/mL (ref <200)
MDMA: NEGATIVE ng/mL (ref <500)
Marijuana Metabolite: NEGATIVE ng/mL (ref <20)
Morphine: NEGATIVE ng/mL (ref <50)
NORDIAZEPAM: NEGATIVE ng/mL (ref <50)
NORHYDROCODONE: 128 ng/mL — AB (ref <50)
OPIATES: POSITIVE ng/mL — AB (ref <100)
OXAZEPAM: NEGATIVE ng/mL (ref <50)
OXIDANT: NEGATIVE ug/mL (ref <200)
OXYCODONE: NEGATIVE ng/mL (ref <100)
PH: 6.66 (ref 4.5–9.0)
Temazepam: NEGATIVE ng/mL (ref <50)

## 2017-11-26 ENCOUNTER — Encounter: Payer: Self-pay | Admitting: Nurse Practitioner

## 2017-11-26 DIAGNOSIS — M545 Low back pain, unspecified: Secondary | ICD-10-CM | POA: Insufficient documentation

## 2017-11-26 DIAGNOSIS — G8929 Other chronic pain: Secondary | ICD-10-CM | POA: Insufficient documentation

## 2017-11-26 NOTE — Assessment & Plan Note (Signed)
Stable, continue current medications F/U for new, worsening symptoms Update UDS and controlled substance contract today

## 2017-11-26 NOTE — Assessment & Plan Note (Addendum)
He declines referral to [facility], PT for further E&M of back pain Of note, He is currently working with Dr Berenice Primas, orthopedics, who has prescribed vicodin to him s/p blateral knee replacement, and plans to continue following with Dr Berenice Primas We discussed the risk of tramadol use including dizziness or drowsiness and possibly life threatening respiratory depression especially when used in combination with vicodin, xanax but he persistently requests to be maintained on tramadol at current dosage I will continue tramadol at his request but I will not be increasing the dosage which we discussed  Instructed not to combine with vidocin, xanax Update UDS and controlled substance contract today - Pain Mgmt, Profile 8 w/Conf, U; Future

## 2017-12-03 ENCOUNTER — Other Ambulatory Visit: Payer: Self-pay | Admitting: Family

## 2017-12-04 MED ORDER — TRAMADOL HCL 50 MG PO TABS: 50.0000 mg | ORAL_TABLET | Freq: Three times a day (TID) | ORAL | 0 refills | Status: DC | PRN

## 2017-12-04 MED ORDER — ALPRAZOLAM 1 MG PO TABS: 1.0000 mg | ORAL_TABLET | Freq: Every evening | ORAL | 0 refills | Status: DC | PRN

## 2017-12-04 NOTE — Telephone Encounter (Signed)
Last refill 11/03/17 for both per database.

## 2018-01-01 ENCOUNTER — Other Ambulatory Visit: Payer: Self-pay | Admitting: Nurse Practitioner

## 2018-01-02 ENCOUNTER — Other Ambulatory Visit: Payer: Self-pay | Admitting: Nurse Practitioner

## 2018-01-02 MED ORDER — TRAMADOL HCL 50 MG PO TABS: 50.0000 mg | ORAL_TABLET | Freq: Three times a day (TID) | ORAL | 0 refills | Status: DC | PRN

## 2018-01-02 MED ORDER — ALPRAZOLAM 1 MG PO TABS: 1.0000 mg | ORAL_TABLET | Freq: Every evening | ORAL | 0 refills | Status: DC | PRN

## 2018-01-02 NOTE — Telephone Encounter (Signed)
Last refill was 12/04/17 per database on both medications.

## 2018-01-02 NOTE — Telephone Encounter (Signed)
Check [place] registry last filled BOTH 12/04/2017.Marland Kitchen/LMB

## 2018-01-06 ENCOUNTER — Encounter (HOSPITAL_COMMUNITY): Payer: Self-pay

## 2018-01-06 ENCOUNTER — Ambulatory Visit (HOSPITAL_COMMUNITY)
Admission: EM | Admit: 2018-01-06 | Discharge: 2018-01-06 | Disposition: A | Discharge status: Home / Self Care | Payer: Medicare Other

## 2018-01-06 DIAGNOSIS — S91209A Unspecified open wound of unspecified toe(s) with damage to nail, initial encounter: Secondary | ICD-10-CM

## 2018-01-06 NOTE — Discharge Instructions (Addendum)
Return if any problems.

## 2018-01-06 NOTE — ED Provider Notes (Signed)
[place]    CSN: [id number] Arrival date & time: 01/06/18  1643     History   Chief Complaint Chief Complaint  Patient presents with  . Nail Problem    HPI Aaron [name] is a 72 y.o. male.   The history is provided by the patient. No language interpreter was used.  Toe Pain  This is a new problem. The current episode started 3 to 5 hours ago. The problem has not changed since onset.Nothing aggravates the symptoms. Nothing relieves the symptoms.  Pt is on blood thinner. Pt reports he has bad toenail.  He caught toenail and it pulled away from skin.  Pt reports nail is hanging but he was afraid to remove because he is on blood thinner  Past Medical History:  Diagnosis Date  . Anemia    "@ birth"  . Anxiety   . Blood transfusion    "@ birth"  . Cancer ([place])    Head - skin cancer  . Constipation due to pain medication   . Coronary artery disease    DES LAD 2005  . Depression   . Full dentures   . GERD (gastroesophageal reflux disease)   . 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 Heart murmur   . Hyperlipidemia   . Hypertension   . Insomnia   . Osteoarthritis   . PONV (postoperative nausea and vomiting)    2006 - knee surgery  . Pre-diabetes   . Psoriasis   . PTSD (post-traumatic stress disorder)   . Pulmonary embolism ([place]) ~ 03/2006   bilaterally  . Seasonal allergies   . Shortness of breath 06/08/11   "w/exertion; that's why I'm here"  . Sleep apnea, obstructive   . Umbilical hernia     Patient Active Problem List   Diagnosis Date Noted  . Chronic low back pain 11/26/2017  . URI (upper respiratory infection) 09/26/2017  . Status post total knee replacement 08/15/2016  . Morbid obesity ([employer]) 08/01/2016  . Primary osteoarthritis of right knee 04/04/2016  . DVT (deep venous thrombosis) ([place]) 11/09/2015  . Primary osteoarthritis of left knee 10/09/2015  . Generalized anxiety disorder 08/20/2015  . Routine general  medical examination at a health care facility 02/11/2015  . Medicare annual wellness visit, subsequent 02/11/2015  . Long term current use of anticoagulant 06/14/2011  . Coronary artery disease   . Hypertension   . Hyperlipidemia   . Depression   . GERD (gastroesophageal reflux disease)   . Insomnia   . Sleep apnea, obstructive   . Pulmonary embolism [facility])     Past Surgical History:  Procedure Laterality Date  . CARDIAC CATHETERIZATION    . carpel tunnel release Bilateral ~ 06/2005/~ 08/2005   right/left  . COLONOSCOPY W/ BIOPSIES AND POLYPECTOMY    . CORONARY ANGIOPLASTY    . CORONARY STENT PLACEMENT  02/13/2004   1  . ENUCLEATION Right    age 18  . EYE SURGERY     age 43; "ruptured  right pupil"  . EYE SURGERY     age 70  . EYE SURGERY  1957   placed artificial right eye  . INGUINAL HERNIA REPAIR Right 1983  . INGUINAL HERNIA REPAIR Left 1985  . KNEE ARTHROSCOPY  ~ 06/2005   right  . MULTIPLE TOOTH EXTRACTIONS    . spinal injections  2006-2007   "maybe 10 injections for herniated discs"  . TONSILLECTOMY     "  when I was real young"  . TOTAL KNEE ARTHROPLASTY Left 10/09/2015   Procedure: TOTAL KNEE ARTHROPLASTY;  Surgeon: Dorna Leitz, MD;  Location: [place];  Service: Orthopedics;  Laterality: Left;  . TOTAL KNEE ARTHROPLASTY Right 04/04/2016   Procedure: TOTAL KNEE ARTHROPLASTY;  Surgeon: Dorna Leitz, MD;  Location: [place];  Service: Orthopedics;  Laterality: Right;       Home Medications    Prior to Admission medications   Medication Sig Start Date End Date Taking? Authorizing Provider  ALPRAZolam Duanne Moron) 1 MG tablet Take 1 tablet (1 mg total) by mouth at bedtime as needed for anxiety or sleep. 01/02/18   Lance Sell, NP  amLODipine (NORVASC) 5 MG tablet Take 1 tablet (5 mg total) daily by mouth. At bedtime 05/10/17   Lance Sell, NP  atorvastatin (LIPITOR) 40 MG tablet TAKE 1 TABLET BY MOUTH  DAILY 08/28/17   [place], Peter M, MD  fenofibrate 160 MG tablet  TAKE 1 TABLET BY MOUTH  DAILY 10/17/17   Lance Sell, NP  HYDROcodone-acetaminophen (NORCO/VICODIN) 5-325 MG tablet Take 1 tablet by mouth every 12 (twelve) hours.     [provider]  losartan-hydrochlorothiazide (HYZAAR) 100-25 MG tablet Take 1 tablet by mouth daily. 08/28/17   Lance Sell, NP  omeprazole (PRILOSEC) 20 MG capsule TAKE 1 CAPSULE BY MOUTH  DAILY 10/17/17   Lance Sell, NP  polyethylene glycol powder (GLYCOLAX/MIRALAX) powder USING MEASURING CAP MIX  17GM IN WATER AND DRINK  DAILY 08/03/17   Lance Sell, NP  rivaroxaban (XARELTO) 20 MG TABS tablet TAKE 1 TABLET BY MOUTH  DAILY WITH SUPPER 08/28/17   Lance Sell, NP  traMADol (ULTRAM) 50 MG tablet Take 1-2 tablets (50-100 mg total) by mouth every 8 (eight) hours as needed. for pain 01/02/18   Lance Sell, NP  venlafaxine XR (EFFEXOR-XR) 37.5 MG 24 hr capsule Take 1 capsule (37.5 mg total) by mouth daily. 08/28/17   Lance Sell, NP    Family History Family History  Problem Relation Age of Onset  . Asthma Father   . Emphysema Father   . Breast cancer Sister   . Liver cancer Sister   . Asthma Sister   . Asthma Brother     Social History Social History   Tobacco Use  . Smoking status: Former Smoker    Packs/day: 1.50    Years: 25.00    Pack years: 37.50    Types: Cigarettes    Last attempt to quit: 07/10/1995    Years since quitting: 22.5  . Smokeless tobacco: Never Used  Substance Use Topics  . Alcohol use: No  . Drug use: No     Allergies   Wellbutrin [bupropion] and Amoxicillin   Review of Systems Review of Systems  All other systems reviewed and are negative.    Physical Exam Triage Vital Signs ED Triage Vitals [01/06/18 1703]  Enc Vitals Group     BP (!) 144/71     Pulse Rate 72     Resp 18     Temp 98 F (36.7 C)     Temp src      SpO2 97 %     Weight      Height      Head Circumference      Peak Flow      Pain Score 3      Pain Loc      Pain Edu?  Excl. in [place]?    No data found.  Updated Vital Signs BP (!) 144/71   Pulse 72   Temp 98 F (36.7 C)   Resp 18   SpO2 97%   Visual Acuity Right Eye Distance:   Left Eye Distance:   Bilateral Distance:    Right Eye Near:   Left Eye Near:    Bilateral Near:     Physical Exam  Constitutional: He appears well-developed and well-nourished.  HENT:  Head: Normocephalic.  Musculoskeletal:  Nearly avulsed toenail left 2nd toenail,  Nail is having by thin dried piece of skin.    Neurological: He is alert.  Nursing note and vitals reviewed.  Nail removed with gentle traction.  No bleeding   UC Treatments / Results  Labs (all labs ordered are listed, but only abnormal results are displayed) Labs Reviewed - No data to display  EKG None  Radiology No results found.  Procedures Procedures (including critical care time)  Medications Ordered in UC Medications - No data to display  Initial Impression / Assessment and Plan / UC Course  I have reviewed the triage vital signs and the nursing notes.  Pertinent labs & imaging results that were available during my care of the patient were reviewed by me and considered in my medical decision making (see chart for details).     Nail removed with  Final Clinical Impressions(s) / UC Diagnoses   Final diagnoses:  Nail avulsion of toe, initial encounter     Discharge Instructions     Return if any problems.   ED Prescriptions    None     Controlled Substance Prescriptions [place] Controlled Substance Registry consulted? Not Applicable   Fransico Meadow, [place] 01/06/18 2112

## 2018-01-06 NOTE — ED Triage Notes (Signed)
Reports having toe nail that is half way off, patient is on xarelto, bleeding is controlled

## 2018-01-10 DIAGNOSIS — G4733 Obstructive sleep apnea (adult) (pediatric): Secondary | ICD-10-CM | POA: Diagnosis not present

## 2018-01-31 ENCOUNTER — Other Ambulatory Visit: Payer: Self-pay | Admitting: Nurse Practitioner

## 2018-01-31 MED ORDER — ALPRAZOLAM 1 MG PO TABS: 1.0000 mg | ORAL_TABLET | Freq: Every evening | ORAL | 0 refills | Status: DC | PRN

## 2018-01-31 MED ORDER — TRAMADOL HCL 50 MG PO TABS: 50.0000 mg | ORAL_TABLET | Freq: Three times a day (TID) | ORAL | 0 refills | Status: DC | PRN

## 2018-01-31 NOTE — Telephone Encounter (Signed)
Last refill was 01/02/18 for both medications. Please advise.

## 2018-02-13 ENCOUNTER — Encounter: Payer: Medicare Other | Admitting: Nurse Practitioner

## 2018-02-14 ENCOUNTER — Ambulatory Visit (INDEPENDENT_AMBULATORY_CARE_PROVIDER_SITE_OTHER): Payer: Medicare Other | Admitting: Nurse Practitioner

## 2018-02-14 ENCOUNTER — Other Ambulatory Visit (INDEPENDENT_AMBULATORY_CARE_PROVIDER_SITE_OTHER): Payer: Medicare Other

## 2018-02-14 ENCOUNTER — Encounter: Payer: Self-pay | Admitting: Nurse Practitioner

## 2018-02-14 VITALS — BP 142/82 | HR 74 | Temp 98.4°F | Resp 16 | Ht 65.5 in | Wt 252.0 lb

## 2018-02-14 DIAGNOSIS — E7800 Pure hypercholesterolemia, unspecified: Secondary | ICD-10-CM

## 2018-02-14 DIAGNOSIS — I2510 Atherosclerotic heart disease of native coronary artery without angina pectoris: Secondary | ICD-10-CM

## 2018-02-14 DIAGNOSIS — R7309 Other abnormal glucose: Secondary | ICD-10-CM

## 2018-02-14 DIAGNOSIS — I10 Essential (primary) hypertension: Secondary | ICD-10-CM

## 2018-02-14 DIAGNOSIS — Z125 Encounter for screening for malignant neoplasm of prostate: Secondary | ICD-10-CM

## 2018-02-14 DIAGNOSIS — F32A Depression, unspecified: Secondary | ICD-10-CM

## 2018-02-14 DIAGNOSIS — K219 Gastro-esophageal reflux disease without esophagitis: Secondary | ICD-10-CM

## 2018-02-14 DIAGNOSIS — F329 Major depressive disorder, single episode, unspecified: Secondary | ICD-10-CM

## 2018-02-14 DIAGNOSIS — Z0000 Encounter for general adult medical examination without abnormal findings: Secondary | ICD-10-CM

## 2018-02-14 LAB — COMPREHENSIVE METABOLIC PANEL
ALK PHOS: 62 U/L (ref 39–117)
ALT: 12 U/L (ref 0–53)
AST: 16 U/L (ref 0–37)
Albumin: 4.4 g/dL (ref 3.5–5.2)
BILIRUBIN TOTAL: 0.3 mg/dL (ref 0.2–1.2)
BUN: 21 mg/dL (ref 6–23)
CALCIUM: 10.3 mg/dL (ref 8.4–10.5)
CO2: 26 meq/L (ref 19–32)
CREATININE: 1.16 mg/dL (ref 0.40–1.50)
Chloride: 107 mEq/L (ref 96–112)
GFR: 65.74 mL/min (ref >60.00)
GLUCOSE: 121 mg/dL — AB (ref 70–99)
Potassium: 4.4 mEq/L (ref 3.5–5.1)
Sodium: 147 mEq/L — ABNORMAL HIGH (ref 135–145)
TOTAL PROTEIN: 7.3 g/dL (ref 6.0–8.3)

## 2018-02-14 LAB — CBC
HCT: 37.5 % — ABNORMAL LOW (ref 39.0–52.0)
HEMOGLOBIN: 13 g/dL (ref 13.0–17.0)
MCHC: 34.6 g/dL (ref 30.0–36.0)
MCV: 83.7 fl (ref 78.0–100.0)
Platelets: 257 10*3/uL (ref 150.0–400.0)
RBC: 4.48 Mil/uL (ref 4.22–5.81)
RDW: 14.2 % (ref 11.5–15.5)
WBC: 6.2 10*3/uL (ref 4.0–10.5)

## 2018-02-14 LAB — LIPID PANEL
Cholesterol: 172 mg/dL (ref 0–200)
HDL: 44.3 mg/dL (ref >39.00)
LDL Cholesterol: 99 mg/dL (ref 0–99)
NONHDL: 127.91
TRIGLYCERIDES: 143 mg/dL (ref 0.0–149.0)
Total CHOL/HDL Ratio: 4
VLDL: 28.6 mg/dL (ref 0.0–40.0)

## 2018-02-14 LAB — PSA: PSA: 0.59 ng/mL (ref 0.10–4.00)

## 2018-02-14 LAB — MAGNESIUM: MAGNESIUM: 2.1 mg/dL (ref 1.5–2.5)

## 2018-02-14 LAB — HEMOGLOBIN A1C: HEMOGLOBIN A1C: 6.4 % (ref 4.6–6.5)

## 2018-02-14 NOTE — Assessment & Plan Note (Addendum)
Continue current medications Update labs He is fasting - Lipid panel; Future

## 2018-02-14 NOTE — Patient Instructions (Addendum)
Please head downstairs for lab work. If any of your test results are critically abnormal, you will be contacted right away. Otherwise, I will contact you within a week about your test results and follow up recommendations  Please call cardiology to schedule routine follow up and gastroenterology to schedule your routine colonoscopy.  Please return to see me in about 3 months so I can recheck your blood pressure.   Health Maintenance, Male A healthy lifestyle and preventive care is important for your health and wellness. Ask your health care provider about what schedule of regular examinations is right for you. What should I know about weight and diet? Eat a Healthy Diet  Eat plenty of vegetables, fruits, whole grains, low-fat dairy products, and lean protein.  Do not eat a lot of foods high in solid fats, added sugars, or salt.  Maintain a Healthy Weight Regular exercise can help you achieve or maintain a healthy weight. You should:  Do at least 150 minutes of exercise each week. The exercise should increase your heart rate and make you sweat (moderate-intensity exercise).  Do strength-training exercises at least twice a week.  Watch Your Levels of Cholesterol and Blood Lipids  Have your blood tested for lipids and cholesterol every 5 years starting at 72 years of age. If you are at high risk for heart disease, you should start having your blood tested when you are 72 years old. You may need to have your cholesterol levels checked more often if: ? Your lipid or cholesterol levels are high. ? You are older than 72 years of age. ? You are at high risk for heart disease.  What should I know about cancer screening? Many types of cancers can be detected early and may often be prevented. Lung Cancer  You should be screened every year for lung cancer if: ? You are a current smoker who has smoked for at least 30 years. ? You are a former smoker who has quit within the past 15  years.  Talk to your health care provider about your screening options, when you should start screening, and how often you should be screened.  Colorectal Cancer  Routine colorectal cancer screening usually begins at 72 years of age and should be repeated every 5-10 years until you are 72 years old. You may need to be screened more often if early forms of precancerous polyps or small growths are found. Your health care provider may recommend screening at an earlier age if you have risk factors for colon cancer.  Your health care provider may recommend using home test kits to check for hidden blood in the stool.  A small camera at the end of a tube can be used to examine your colon (sigmoidoscopy or colonoscopy). This checks for the earliest forms of colorectal cancer.  Prostate and Testicular Cancer  Depending on your age and overall health, your health care provider may do certain tests to screen for prostate and testicular cancer.  Talk to your health care provider about any symptoms or concerns you have about testicular or prostate cancer.  Skin Cancer  Check your skin from head to toe regularly.  Tell your health care provider about any new moles or changes in moles, especially if: ? There is a change in a mole's size, shape, or color. ? You have a mole that is larger than a pencil eraser.  Always use sunscreen. Apply sunscreen liberally and repeat throughout the day.  Protect yourself by wearing long sleeves,  pants, a wide-brimmed hat, and sunglasses when outside.  What should I know about heart disease, diabetes, and high blood pressure?  If you are 70-66 years of age, have your blood pressure checked every 3-5 years. If you are 55 years of age or older, have your blood pressure checked every year. You should have your blood pressure measured twice-once when you are at a hospital or clinic, and once when you are not at a hospital or clinic. Record the average of the two  measurements. To check your blood pressure when you are not at a hospital or clinic, you can use: ? An automated blood pressure machine at a pharmacy. ? A home blood pressure monitor.  Talk to your health care provider about your target blood pressure.  If you are between 16-58 years old, ask your health care provider if you should take aspirin to prevent heart disease.  Have regular diabetes screenings by checking your fasting blood sugar level. ? If you are at a normal weight and have a low risk for diabetes, have this test once every three years after the age of 83. ? If you are overweight and have a high risk for diabetes, consider being tested at a younger age or more often.  A one-time screening for abdominal aortic aneurysm (AAA) by ultrasound is recommended for men aged 23-75 years who are current or former smokers. What should I know about preventing infection? Hepatitis B If you have a higher risk for hepatitis B, you should be screened for this virus. Talk with your health care provider to find out if you are at risk for hepatitis B infection. Hepatitis C Blood testing is recommended for:  Everyone born from [birth year] through 1965.  Anyone with known risk factors for hepatitis C.  Sexually Transmitted Diseases (STDs)  You should be screened each year for STDs including gonorrhea and chlamydia if: ? You are sexually active and are younger than 72 years of age. ? You are older than 72 years of age and your health care provider tells you that you are at risk for this type of infection. ? Your sexual activity has changed since you were last screened and you are at an increased risk for chlamydia or gonorrhea. Ask your health care provider if you are at risk.  Talk with your health care provider about whether you are at high risk of being infected with HIV. Your health care provider may recommend a prescription medicine to help prevent HIV infection.  What else can I do?  Schedule  regular health, dental, and eye exams.  Stay current with your vaccines (immunizations).  Do not use any tobacco products, such as cigarettes, chewing tobacco, and e-cigarettes. If you need help quitting, ask your health care provider.  Limit alcohol intake to no more than 2 drinks per day. One drink equals 12 ounces of beer, 5 ounces of wine, or 1 ounces of hard liquor.  Do not use street drugs.  Do not share needles.  Ask your health care provider for help if you need support or information about quitting drugs.  Tell your health care provider if you often feel depressed.  Tell your health care provider if you have ever been abused or do not feel safe at home. This information is not intended to replace advice given to you by your health care provider. Make sure you discuss any questions you have with your health care provider. Document Released: 12/17/2007 Document Revised: 02/17/2016 Document Reviewed: 03/24/2015 Elsevier  Interactive Patient Education  [employer].

## 2018-02-14 NOTE — Progress Notes (Signed)
Name: Aaron [name]   MRN: [id number]    DOB: 04-23-1946   Date:02/14/2018       Progress Note  Subjective  Chief Complaint  Chief Complaint  Patient presents with  . CPE    fasting    HPI  Patient presents for annual CPE.  USPSTF grade A and B recommendations:  Diet, Exercise: trying to watch his diet, trying to increase his exercise- activities around the house, yard work  Depression: maintained on effexor XR 37.5 daily, xanax 1 prn at bedtime for anxiety or sleep- reports good control of his mood on current medications. No thoughts of hurting himself or others. Depression screen PHQ 2/9 04/20/2017  Decreased Interest 1  Down, Depressed, Hopeless 1  PHQ - 2 Score 2  Altered sleeping 1  Tired, decreased energy 1  Change in appetite 1  Feeling bad or failure about yourself  1  Trouble concentrating 0  Moving slowly or fidgety/restless 1  Suicidal thoughts 0  PHQ-9 Score 7   Hypertension -maintained on amlodipine 5, losartan-HCTZ 100-25 Reports daily medication compliance without adverse medication effects. He feels his BP is elevated today because he was rushing to get to appointment  BP Readings from Last 3 Encounters:  02/14/18 (!) 142/82  01/06/18 (!) 144/71  11/08/17 138/60   Obesity: Wt Readings from Last 3 Encounters:  02/14/18 252 lb (114.3 kg)  11/08/17 254 lb (115.2 kg)  10/04/17 249 lb 1.3 oz (113 kg)   BMI Readings from Last 3 Encounters:  02/14/18 41.30 kg/m  11/08/17 41.62 kg/m  10/04/17 48.65 kg/m    Cholesterol- maintained on atorvastatin 40, fenofibrate 160 Reports daily medication compliance.  Lab Results  Component Value Date   CHOL 159 02/10/2017   HDL 38.80 (L) 02/10/2017   LDLCALC 94 02/10/2017   TRIG 130.0 02/10/2017   CHOLHDL 4 02/10/2017   Glucose: a1C today Glucose, Bld  Date Value Ref Range Status  02/10/2017 87 70 - 99 mg/dL Final  04/05/2016 230 (H) 65 - 99 mg/dL Final  03/25/2016 121 (H) 65 - 99 mg/dL Final    Glucose-Capillary  Date Value Ref Range Status  04/04/2016 79 65 - 99 mg/dL Final  04/04/2016 105 (H) 65 - 99 mg/dL Final   Alcohol: no Tobacco use: no, quit 22 years ago  STD testing and prevention (chl/gon/syphilis): no concerns, declines Hep C screening: done in past  Skin cancer: does not routinely wear sunscreen   Colorectal cancer: No personal or family history of colon ca, no abdominal pain, no bowel changes, no rectal bleeding. He is established with Dr Collene Mares at [facility] and has received reminder from their office  to schedule routine colonoscopy but he has not scheduled yet.  Prostate cancer: PSA ordered today Lab Results  Component Value Date   PSA 0.58 02/11/2015   Aspirin: maintained on xarelto  Vaccinations: up to date  Advanced Care Planning: A voluntary discussion about advance care planning including the explanation and discussion of advance directives.  Discussed health care proxy and Living will, and the patient DOES NOT have a living will at present time. If patient does have living will, I have requested they bring this to the clinic to be scanned in to their chart.  Patient Active Problem List   Diagnosis Date Noted  . Chronic low back pain 11/26/2017  . Status post total knee replacement 08/15/2016  . Morbid obesity ([place]) 08/01/2016  . Primary osteoarthritis of right knee 04/04/2016  . DVT (deep venous thrombosis) (  [place]) 11/09/2015  . Primary osteoarthritis of left knee 10/09/2015  . Generalized anxiety disorder 08/20/2015  . Routine general medical examination at a health care facility 02/11/2015  . Medicare annual wellness visit, subsequent 02/11/2015  . Long term current use of anticoagulant 06/14/2011  . Coronary artery disease   . Hypertension   . Hyperlipidemia   . Depression   . GERD (gastroesophageal reflux disease)   . Insomnia   . Sleep apnea, obstructive   . Pulmonary embolism [facility])     Past Surgical History:  Procedure Laterality Date   . CARDIAC CATHETERIZATION    . carpel tunnel release Bilateral ~ 06/2005/~ 08/2005   right/left  . COLONOSCOPY W/ BIOPSIES AND POLYPECTOMY    . CORONARY ANGIOPLASTY    . CORONARY STENT PLACEMENT  02/13/2004   1  . ENUCLEATION Right    age 72  . EYE SURGERY     age 42; "ruptured  right pupil"  . EYE SURGERY     age 4  . EYE SURGERY  1957   placed artificial right eye  . INGUINAL HERNIA REPAIR Right 1983  . INGUINAL HERNIA REPAIR Left 1985  . KNEE ARTHROSCOPY  ~ 06/2005   right  . MULTIPLE TOOTH EXTRACTIONS    . spinal injections  2006-2007   "maybe 10 injections for herniated discs"  . TONSILLECTOMY     "when I was real young"  . TOTAL KNEE ARTHROPLASTY Left 10/09/2015   Procedure: TOTAL KNEE ARTHROPLASTY;  Surgeon: Dorna Leitz, MD;  Location: [place];  Service: Orthopedics;  Laterality: Left;  . TOTAL KNEE ARTHROPLASTY Right 04/04/2016   Procedure: TOTAL KNEE ARTHROPLASTY;  Surgeon: Dorna Leitz, MD;  Location: [place];  Service: Orthopedics;  Laterality: Right;    Family History  Problem Relation Age of Onset  . Asthma Father   . Emphysema Father   . Breast cancer Sister   . Liver cancer Sister   . Asthma Sister   . Asthma Brother     Social History   Socioeconomic History  . Marital status: Married    Spouse name: Not on file  . Number of children: 3  . Years of education: Not on file  . Highest education level: Not on file  Occupational History  . Occupation: building maintenance    Comment: disabled  Social Needs  . Financial resource strain: Not on file  . Food insecurity:    Worry: Not on file    Inability: Not on file  . Transportation needs:    Medical: Not on file    Non-medical: Not on file  Tobacco Use  . Smoking status: Former Smoker    Packs/day: 1.50    Years: 25.00    Pack years: 37.50    Types: Cigarettes    Last attempt to quit: 07/10/1995    Years since quitting: 22.6  . Smokeless tobacco: Never Used  Substance and Sexual Activity  . Alcohol  use: No  . Drug use: No  . Sexual activity: Not on file  Lifestyle  . Physical activity:    Days per week: Not on file    Minutes per session: Not on file  . Stress: Not on file  Relationships  . Social connections:    Talks on phone: Not on file    Gets together: Not on file    Attends religious service: Not on file    Active member of club or organization: Not on file    Attends meetings of clubs  or organizations: Not on file    Relationship status: Not on file  . Intimate partner violence:    Fear of current or ex partner: Not on file    Emotionally abused: Not on file    Physically abused: Not on file    Forced sexual activity: Not on file  Other Topics Concern  . 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   Recent stress, currently undergoing financial difficulties.     Current Outpatient Medications:  .  ALPRAZolam (XANAX) 1 MG tablet, Take 1 tablet (1 mg total) by mouth at bedtime as needed for anxiety or sleep., Disp: 30 tablet, Rfl: 0 .  amLODipine (NORVASC) 5 MG tablet, Take 1 tablet (5 mg total) daily by mouth. At bedtime, Disp: 90 tablet, Rfl: 3 .  atorvastatin (LIPITOR) 40 MG tablet, TAKE 1 TABLET BY MOUTH  DAILY, Disp: 90 tablet, Rfl: 2 .  fenofibrate 160 MG tablet, TAKE 1 TABLET BY MOUTH  DAILY, Disp: 90 tablet, Rfl: 1 .  HYDROcodone-acetaminophen (NORCO/VICODIN) 5-325 MG tablet, Take 1 tablet by mouth every 12 (twelve) hours. , Disp: , Rfl:  .  losartan-hydrochlorothiazide (HYZAAR) 100-25 MG tablet, Take 1 tablet by mouth daily., Disp: 90 tablet, Rfl: 1 .  omeprazole (PRILOSEC) 20 MG capsule, TAKE 1 CAPSULE BY MOUTH  DAILY, Disp: 90 capsule, Rfl: 1 .  polyethylene glycol powder (GLYCOLAX/MIRALAX) powder, USING MEASURING CAP MIX  17GM IN WATER AND DRINK  DAILY, Disp: 1054 g, Rfl: 0 .  rivaroxaban (XARELTO) 20 MG TABS tablet, TAKE 1 TABLET BY MOUTH  DAILY WITH SUPPER, Disp: 90 tablet, Rfl: 1 .  traMADol  (ULTRAM) 50 MG tablet, Take 1-2 tablets (50-100 mg total) by mouth every 8 (eight) hours as needed. for pain, Disp: 180 tablet, Rfl: 0 .  venlafaxine XR (EFFEXOR-XR) 37.5 MG 24 hr capsule, Take 1 capsule (37.5 mg total) by mouth daily., Disp: 90 capsule, Rfl: 1  Allergies  Allergen Reactions  . Wellbutrin [Bupropion] Other (See Comments)    Hallucinations, sweating  . Amoxicillin Palpitations     ROS  Constitutional: Negative for fever or weight change.  Respiratory: Negative for cough and shortness of breath.   Cardiovascular: Negative for chest pain or palpitations.  Gastrointestinal: Negative for abdominal pain, no bowel changes.  Musculoskeletal: Negative for falls. Skin: Negative for rash.  Neurological: Negative for dizziness or headache.  No other specific complaints in a complete review of systems (except as listed in HPI above).  Objective  Vitals:   02/14/18 0815 02/14/18 0910  BP: (!) 142/62 (!) 142/82  Pulse: 74   Resp: 16   Temp: 98.4 F (36.9 C)   TempSrc: Oral   SpO2: 95%   Weight: 252 lb (114.3 kg)   Height: 5' 5.5" (1.664 m)     Body mass index is 41.3 kg/m.  Physical Exam Vital signs reviewed. Constitutional: Patient appears well-developed and well-nourished. No distress.  HENT: Head: Normocephalic and atraumatic. Ears: B TMs ok, no erythema or effusion; Nose: Nose normal. Mouth/Throat: Oropharynx is clear and moist. No oropharyngeal exudate.  Eyes: Conjunctivae and EOM are normal. Pupils are equal, round, and reactive to light. No scleral icterus.  Neck: Normal range of motion. Neck supple. No cervical adenopathy. No thyromegaly present.  Cardiovascular: Normal rate, regular rhythm and normal heart sounds.  No murmur heard. No BLE edema. Distal pulses intact Pulmonary/Chest: Effort normal and breath sounds normal. No respiratory distress. Abdominal:  Soft. Bowel sounds are normal, no distension.  Musculoskeletal: No gross deformities Neurological:  he is alert and oriented to person, place, and time. No cranial nerve deficit. Coordination, balance, strength, speech are normal.  Skin: Skin is warm and dry. No rash noted. No erythema.  Psychiatric: Patient has a normal mood and affect. behavior is normal. Judgment and thought content normal.   Assessment & Plan RTC in 3 months for F/U: HTN- recheck BP reading

## 2018-02-14 NOTE — Assessment & Plan Note (Signed)
BP just slightly elevated on 2 readings today, will continue current medications for now and have patient return in about 3 months for F/U BP reading- will consider medication adjustments if BP remains elevated - CBC; Future - Comprehensive metabolic panel; Future - Lipid panel; Future

## 2018-02-14 NOTE — Assessment & Plan Note (Addendum)
Continue current medications Continue routine follow up with cardiology as recommended

## 2018-02-14 NOTE — Assessment & Plan Note (Signed)
Stable Continue prilosec Update labs - Magnesium; Future

## 2018-02-14 NOTE — Assessment & Plan Note (Signed)
Stable Continue current medications F/U for new, worsening symptoms

## 2018-02-14 NOTE — Assessment & Plan Note (Addendum)
-  Prostate cancer screening and PSA options (with potential risks and benefits of testing vs not testing) were discussed along with recent recs/guidelines. -USPSTF grade A and B recommendations reviewed with patient; age-appropriate recommendations, preventive care, screening tests, etc discussed and encouraged; healthy living and sunscreen use encouraged; see AVS for patient education given to patient. Declines advanced directives packet -Discussed importance of 150 minutes of physical activity weekly, eat 6 servings of fruit/vegetables daily and drink plenty of water and avoid sweet beverages.  -Follow up and care instructions discussed and provided in AVS.   -Reviewed Health Maintenance: plans to call [facility] Dr Collene Mares for routine colonoscopy, otherwise up to date  Routine general medical examination at a health care facility - CBC; Future - Comprehensive metabolic panel; Future - Lipid panel; Future - PSA; Future-Screening for prostate cancer - Hemoglobin A1c; Future-Elevated glucose-noted on lab review

## 2018-02-19 ENCOUNTER — Other Ambulatory Visit: Payer: Self-pay | Admitting: Nurse Practitioner

## 2018-02-27 DIAGNOSIS — M7062 Trochanteric bursitis, left hip: Secondary | ICD-10-CM | POA: Diagnosis not present

## 2018-03-01 ENCOUNTER — Other Ambulatory Visit: Payer: Self-pay | Admitting: Nurse Practitioner

## 2018-03-02 NOTE — Telephone Encounter (Signed)
[place] Controlled Database Checked Last filled: 01/31/18 LOV w/you: 02/14/18 Next appt w/you: 05/18/18

## 2018-03-03 ENCOUNTER — Other Ambulatory Visit: Payer: Self-pay | Admitting: Nurse Practitioner

## 2018-03-06 MED ORDER — TRAMADOL HCL 50 MG PO TABS: 50.0000 mg | ORAL_TABLET | Freq: Three times a day (TID) | ORAL | 0 refills | Status: DC | PRN

## 2018-03-06 MED ORDER — ALPRAZOLAM 1 MG PO TABS: 1.0000 mg | ORAL_TABLET | Freq: Every evening | ORAL | 0 refills | Status: DC | PRN

## 2018-03-27 NOTE — Progress Notes (Signed)
Cardiology Office Note:    Date:  03/28/2018   ID:  Aaron [name], DOB Aug 17, 1945, MRN [id number]  PCP:  Lance Sell, NP  Cardiologist:  Peter Martinique, MD   Referring MD: Lance Sell, NP   Chief Complaint  Patient presents with  . Follow-up    annual    History of Present Illness:    Aaron [name] is a 72 y.o. male with a hx of hypertension, coronary artery disease, recurrent PE/DVTs on xarelto, GERD, hyperlipidemia, and obesity.  He has a history of stenting to his mid LAD in 2005 following a nuclear stress test in 2012 that showed evidence of anterior apical ischemia.  Follow-up Myoview in 2014 was normal.  He was anticoagulated on Xarelto.  He was last seen in clinic on 04/27/2017.  He was doing well at that time.  Hypertension was managed by his PCP.  He reports to clinic today for follow-up.  The majority of his complaints are orthopedic in nature.  He describes symptoms of carpal tunnel, symptoms of possible ulnar nerve compression, right hip pain, and back pain.  He generally has no cardiac complaints.  His pressure was elevated upon arrival but normalized to 130/50 following the visit.  He is doing well on 5 mg of Norvasc and Hyzaar 100-25 mg daily.  He is compliant on his Xarelto without bleeding problems.  Provided samples today.  Past Medical History:  Diagnosis Date  . Anemia    "@ birth"  . Anxiety   . Blood transfusion    "@ birth"  . Cancer ([place])    Head - skin cancer  . Constipation due to pain medication   . Coronary artery disease    DES LAD 2005  . Depression   . Full dentures   . GERD (gastroesophageal reflux disease)   . 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 Heart murmur   . Hyperlipidemia   . Hypertension   . Insomnia   . Osteoarthritis   . PONV (postoperative nausea and vomiting)    2006 - knee surgery  . Pre-diabetes   . Psoriasis   . PTSD (post-traumatic stress disorder)   . Pulmonary embolism  ([place]) ~ 03/2006   bilaterally  . Seasonal allergies   . Shortness of breath 06/08/11   "w/exertion; that's why I'm here"  . Sleep apnea, obstructive   . Umbilical hernia     Past Surgical History:  Procedure Laterality Date  . CARDIAC CATHETERIZATION    . carpel tunnel release Bilateral ~ 06/2005/~ 08/2005   right/left  . COLONOSCOPY W/ BIOPSIES AND POLYPECTOMY    . CORONARY ANGIOPLASTY    . CORONARY STENT PLACEMENT  02/13/2004   1  . ENUCLEATION Right    age 58  . EYE SURGERY     age 66; "ruptured  right pupil"  . EYE SURGERY     age 35  . EYE SURGERY  1957   placed artificial right eye  . INGUINAL HERNIA REPAIR Right 1983  . INGUINAL HERNIA REPAIR Left 1985  . KNEE ARTHROSCOPY  ~ 06/2005   right  . MULTIPLE TOOTH EXTRACTIONS    . spinal injections  2006-2007   "maybe 10 injections for herniated discs"  . TONSILLECTOMY     "when I was real young"  . TOTAL KNEE ARTHROPLASTY Left 10/09/2015   Procedure: TOTAL KNEE ARTHROPLASTY;  Surgeon: Dorna Leitz, MD;  Location: [place];  Service: Orthopedics;  Laterality: Left;  . TOTAL KNEE ARTHROPLASTY Right 04/04/2016   Procedure: TOTAL KNEE ARTHROPLASTY;  Surgeon: Dorna Leitz, MD;  Location: [place];  Service: Orthopedics;  Laterality: Right;    Current Medications: Current Meds  Medication Sig  . ALPRAZolam (XANAX) 1 MG tablet Take 1 tablet (1 mg total) by mouth at bedtime as needed for anxiety or sleep.  Marland Kitchen amLODipine (NORVASC) 5 MG tablet Take 1 tablet (5 mg total) daily by mouth. At bedtime  . atorvastatin (LIPITOR) 40 MG tablet TAKE 1 TABLET BY MOUTH  DAILY  . fenofibrate 160 MG tablet TAKE 1 TABLET BY MOUTH  DAILY  . HYDROcodone-acetaminophen (NORCO/VICODIN) 5-325 MG tablet Take 1 tablet by mouth every 12 (twelve) hours.   Marland Kitchen losartan-hydrochlorothiazide (HYZAAR) 100-25 MG tablet TAKE 1 TABLET BY MOUTH  DAILY  . omeprazole (PRILOSEC) 20 MG capsule TAKE 1 CAPSULE BY MOUTH  DAILY  . polyethylene glycol powder (GLYCOLAX/MIRALAX) powder  USING MEASURING CAP MIX  17GM IN WATER AND DRINK  DAILY  . tiZANidine (ZANAFLEX) 4 MG tablet Take 1 tablet by mouth as directed.  . traMADol (ULTRAM) 50 MG tablet Take 1-2 tablets (50-100 mg total) by mouth every 8 (eight) hours as needed. for pain  . venlafaxine XR (EFFEXOR-XR) 37.5 MG 24 hr capsule TAKE 1 CAPSULE BY MOUTH  DAILY  . XARELTO 20 MG TABS tablet TAKE 1 TABLET BY MOUTH  DAILY WITH SUPPER     Allergies:   Wellbutrin [bupropion] and Amoxicillin   Social History   Socioeconomic History  . Marital status: Married    Spouse name: Not on file  . Number of children: 3  . Years of education: Not on file  . Highest education level: Not on file  Occupational History  . Occupation: building maintenance    Comment: disabled  Social Needs  . Financial resource strain: Not on file  . Food insecurity:    Worry: Not on file    Inability: Not on file  . Transportation needs:    Medical: Not on file    Non-medical: Not on file  Tobacco Use  . Smoking status: Former Smoker    Packs/day: 1.50    Years: 25.00    Pack years: 37.50    Types: Cigarettes    Last attempt to quit: 07/10/1995    Years since quitting: 22.7  . Smokeless tobacco: Never Used  Substance and Sexual Activity  . Alcohol use: No  . Drug use: No  . Sexual activity: Not on file  Lifestyle  . Physical activity:    Days per week: Not on file    Minutes per session: Not on file  . Stress: Not on file  Relationships  . Social connections:    Talks on phone: Not on file    Gets together: Not on file    Attends religious service: Not on file    Active member of club or organization: Not on file    Attends meetings of clubs or organizations: Not on file    Relationship status: Not on file  Other Topics Concern  . 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   Recent stress, currently undergoing financial difficulties.     Family History: The  patient's family history includes Asthma in his brother, father, and sister; Breast cancer in his sister; Emphysema in his father; Liver cancer in his sister.  ROS:   Please see the history of present  illness.     All other systems reviewed and are negative.  EKGs/Labs/Other Studies Reviewed:    The following studies were reviewed today:  none  EKG:  EKG is ordered today.  The ekg ordered today demonstrates sinus rhythm  Recent Labs: 02/14/2018: ALT 12; BUN 21; Creatinine, Ser 1.16; Hemoglobin 13.0; Magnesium 2.1; Platelets 257.0; Potassium 4.4; Sodium 147  Recent Lipid Panel    Component Value Date/Time   CHOL 172 02/14/2018 0917   TRIG 143.0 02/14/2018 0917   HDL 44.30 02/14/2018 0917   CHOLHDL 4 02/14/2018 0917   VLDL 28.6 02/14/2018 0917   LDLCALC 99 02/14/2018 0917    Physical Exam:    VS:  BP (!) 158/60   Pulse 64   Ht 5' 5.5" (1.664 m)   Wt 252 lb 3.2 oz (114.4 kg)   BMI 41.33 kg/m     Wt Readings from Last 3 Encounters:  03/28/18 252 lb 3.2 oz (114.4 kg)  02/14/18 252 lb (114.3 kg)  11/08/17 254 lb (115.2 kg)     GEN: Well nourished, well developed in no acute distress HEENT: Normal NECK: No JVD; No carotid bruits CARDIAC: RRR, no murmurs, rubs, gallops RESPIRATORY:  Clear to auscultation without rales, wheezing or rhonchi  ABDOMEN: Soft, non-tender, non-distended MUSCULOSKELETAL:  No edema; No deformity  SKIN: Warm and dry NEUROLOGIC:  Alert and oriented x 3 PSYCHIATRIC:  Normal affect   ASSESSMENT:    1. Coronary artery disease involving native coronary artery of native heart without angina pectoris   2. Essential hypertension   3. Other pulmonary embolism without acute cor pulmonale, unspecified chronicity ([place])   4. Deep vein thrombosis (DVT) of proximal lower extremity, unspecified chronicity, unspecified laterality ([place])   5. Long term current use of anticoagulant    PLAN:    In order of problems listed above:  Coronary artery disease  involving native coronary artery of native heart without angina pectoris - Plan: EKG 12-Lead No anginal complaints.  Essential hypertension PCP recently added 5 mg of Norvasc.  I agree with this change.  Pressure was 130/50 on recheck.  Asked him to get a blood pressure machine and keep a blood pressure log 3 times per week.  Other pulmonary embolism without acute cor pulmonale, unspecified chronicity (H[facility]) Deep vein thrombosis (DVT) of proximal lower extremity, unspecified chronicity, unspecified laterality ([place]) Long term current use of anticoagulant He is compliant on Xarelto.  He denies shortness of breath and orthopnea.  No lower extremity swelling.  He is having no bleeding issues.   Follow-up in 1 year.   Medication Adjustments/Labs and Tests Ordered: Current medicines are reviewed at length with the patient today.  Concerns regarding medicines are outlined above.  Orders Placed This Encounter  Procedures  . EKG 12-Lead   No orders of the defined types were placed in this encounter.   Signed, Ledora Bottcher, PA  03/28/2018 4:22 PM    [facility]

## 2018-03-28 ENCOUNTER — Ambulatory Visit: Payer: Medicare Other | Admitting: Physician Assistant

## 2018-03-28 ENCOUNTER — Encounter: Payer: Self-pay | Admitting: Physician Assistant

## 2018-03-28 VITALS — BP 158/60 | HR 64 | Ht 65.5 in | Wt 252.2 lb

## 2018-03-28 DIAGNOSIS — I2510 Atherosclerotic heart disease of native coronary artery without angina pectoris: Secondary | ICD-10-CM

## 2018-03-28 DIAGNOSIS — I10 Essential (primary) hypertension: Secondary | ICD-10-CM | POA: Diagnosis not present

## 2018-03-28 DIAGNOSIS — I2699 Other pulmonary embolism without acute cor pulmonale: Secondary | ICD-10-CM | POA: Diagnosis not present

## 2018-03-28 DIAGNOSIS — I824Y9 Acute embolism and thrombosis of unspecified deep veins of unspecified proximal lower extremity: Secondary | ICD-10-CM

## 2018-03-28 DIAGNOSIS — Z7901 Long term (current) use of anticoagulants: Secondary | ICD-10-CM

## 2018-03-28 NOTE — Patient Instructions (Signed)
Medication Instructions:  Continue current medications  If you need a refill on your cardiac medications before your next appointment, please call your pharmacy.  Labwork: None Ordered   Testing/Procedures: None Ordered   Follow-Up: Your physician wants you to follow-up in: 1 Year with Dr Martinique. You should receive a reminder letter in the mail two months in advance. If you do not receive a letter, please call our office in [phone] to schedule your follow-up appointment.     Thank you for choosing [facility] at [facility]!!

## 2018-04-03 ENCOUNTER — Other Ambulatory Visit: Payer: Self-pay | Admitting: Nurse Practitioner

## 2018-04-03 DIAGNOSIS — M5441 Lumbago with sciatica, right side: Secondary | ICD-10-CM | POA: Diagnosis not present

## 2018-04-03 NOTE — Telephone Encounter (Signed)
[place] Controlled Substance Database checked. Last filled on 03/06/18  Last OV 02/14/18

## 2018-04-04 MED ORDER — ALPRAZOLAM 1 MG PO TABS: 1.0000 mg | ORAL_TABLET | Freq: Every evening | ORAL | 0 refills | Status: DC | PRN

## 2018-04-04 MED ORDER — TRAMADOL HCL 50 MG PO TABS: 50.0000 mg | ORAL_TABLET | Freq: Three times a day (TID) | ORAL | 0 refills | Status: DC | PRN

## 2018-04-05 ENCOUNTER — Other Ambulatory Visit: Payer: Self-pay | Admitting: Orthopedic Surgery

## 2018-04-05 DIAGNOSIS — M545 Low back pain, unspecified: Secondary | ICD-10-CM

## 2018-04-13 ENCOUNTER — Other Ambulatory Visit: Payer: Self-pay | Admitting: Nurse Practitioner

## 2018-04-13 DIAGNOSIS — I10 Essential (primary) hypertension: Secondary | ICD-10-CM

## 2018-04-14 ENCOUNTER — Ambulatory Visit
Admission: RE | Admit: 2018-04-14 | Discharge: 2018-04-14 | Disposition: A | Discharge status: Home / Self Care | Payer: Medicare Other | Source: Ambulatory Visit | Attending: Orthopedic Surgery | Admitting: Orthopedic Surgery

## 2018-04-14 DIAGNOSIS — M545 Low back pain, unspecified: Secondary | ICD-10-CM

## 2018-04-14 DIAGNOSIS — M48061 Spinal stenosis, lumbar region without neurogenic claudication: Secondary | ICD-10-CM | POA: Diagnosis not present

## 2018-04-17 DIAGNOSIS — M5441 Lumbago with sciatica, right side: Secondary | ICD-10-CM | POA: Diagnosis not present

## 2018-04-17 DIAGNOSIS — M5442 Lumbago with sciatica, left side: Secondary | ICD-10-CM | POA: Diagnosis not present

## 2018-04-17 DIAGNOSIS — M545 Low back pain: Secondary | ICD-10-CM | POA: Diagnosis not present

## 2018-04-27 DIAGNOSIS — M533 Sacrococcygeal disorders, not elsewhere classified: Secondary | ICD-10-CM | POA: Diagnosis not present

## 2018-05-01 ENCOUNTER — Other Ambulatory Visit: Payer: Self-pay

## 2018-05-01 NOTE — Patient Outreach (Signed)
[facility]) Care Management  05/01/2018  DARRILL VREELAND 1946/01/25 [id number]   Medication Adherence call to Mr. Gabrien Mentink left a message for patient to call back patient is due on Atorvastatin 40 mg and Losartan / HCTZ 100/25 mg under [place].   [place] Management Direct Dial [phone]  Fax [phone] [email or web address]

## 2018-05-02 DIAGNOSIS — G4733 Obstructive sleep apnea (adult) (pediatric): Secondary | ICD-10-CM | POA: Diagnosis not present

## 2018-05-03 ENCOUNTER — Other Ambulatory Visit: Payer: Self-pay | Admitting: Nurse Practitioner

## 2018-05-03 MED ORDER — LOSARTAN POTASSIUM-HCTZ 100-25 MG PO TABS: 1.0000 | ORAL_TABLET | Freq: Every day | ORAL | 2 refills | Status: DC

## 2018-05-03 NOTE — Telephone Encounter (Addendum)
Copied from [place] [id number]. Topic: Quick Communication - See Telephone Encounter >> May 03, 2018  4:08 PM Ivar Drape wrote: CRM for notification. See Telephone encounter for: 05/03/18. Patient stated that his mail order pharmacy, [facility], stated that they are out of his losartan-hydrochlorothiazide (HYZAAR) 100-25 MG tablet medication.  So he would like for a one mth prescription with a couple of refills [facility] on [address]

## 2018-05-04 MED ORDER — TRAMADOL HCL 50 MG PO TABS: 50.0000 mg | ORAL_TABLET | Freq: Three times a day (TID) | ORAL | 0 refills | Status: DC | PRN

## 2018-05-04 MED ORDER — ALPRAZOLAM 1 MG PO TABS: 1.0000 mg | ORAL_TABLET | Freq: Every evening | ORAL | 0 refills | Status: DC | PRN

## 2018-05-04 NOTE — Telephone Encounter (Signed)
[place] Controlled Database Checked Last filled: Alprazolam 10/2 # 30, tramadol 10/2 # 180 LOV w/you: 02/14/18 Next appt w/you: 05/18/18

## 2018-05-11 ENCOUNTER — Telehealth: Payer: Self-pay

## 2018-05-11 NOTE — Telephone Encounter (Signed)
Copied from [place] [id number]. Topic: General - Other >> May 11, 2018  2:24 PM Carolyn Stare wrote:  Pt call to say the following med is out of stock at both of his pharmacies .losartan-hydrochlorothiazide (HYZAAR) 100-25 MG tablet  He is asking if something can be [facility] in to [facility] he is out of the med

## 2018-05-14 MED ORDER — TELMISARTAN-HCTZ 80-25 MG PO TABS: 1.0000 | ORAL_TABLET | Freq: Every day | ORAL | 1 refills | Status: DC

## 2018-05-14 NOTE — Telephone Encounter (Signed)
Left detailed message informing pt of below.  

## 2018-05-14 NOTE — Telephone Encounter (Signed)
Have sent micardis-HCT in place of his losartan-hctz

## 2018-05-18 ENCOUNTER — Encounter: Payer: Self-pay | Admitting: Nurse Practitioner

## 2018-05-18 ENCOUNTER — Ambulatory Visit: Payer: Medicare Other | Admitting: Nurse Practitioner

## 2018-05-18 VITALS — BP 140/74 | HR 76 | Ht 65.5 in | Wt 256.0 lb

## 2018-05-18 DIAGNOSIS — R7989 Other specified abnormal findings of blood chemistry: Secondary | ICD-10-CM | POA: Diagnosis not present

## 2018-05-18 DIAGNOSIS — R7303 Prediabetes: Secondary | ICD-10-CM | POA: Diagnosis not present

## 2018-05-18 DIAGNOSIS — E870 Hyperosmolality and hypernatremia: Secondary | ICD-10-CM | POA: Diagnosis not present

## 2018-05-18 DIAGNOSIS — I10 Essential (primary) hypertension: Secondary | ICD-10-CM | POA: Diagnosis not present

## 2018-05-18 DIAGNOSIS — M545 Low back pain, unspecified: Secondary | ICD-10-CM

## 2018-05-18 DIAGNOSIS — G8929 Other chronic pain: Secondary | ICD-10-CM

## 2018-05-18 NOTE — Assessment & Plan Note (Signed)
Update A1c F/U with further recommendations pending lab results - Hemoglobin A1c; Future  

## 2018-05-18 NOTE — Assessment & Plan Note (Signed)
Continue current medications Continue planned F/U with ortho UDS 11/2017 consistent RTC in 3 months for F/U

## 2018-05-18 NOTE — Assessment & Plan Note (Signed)
Stable Continue current medications Encouraged to start monitoring at home RTC in 3 month for F/U-recheck BP

## 2018-05-18 NOTE — Progress Notes (Signed)
Aaron [name] is a 72 y.o. male with the following history as recorded in EpicCare:  Patient Active Problem List   Diagnosis Date Noted  . Chronic low back pain 11/26/2017  . Status post total knee replacement 08/15/2016  . Morbid obesity ([place]) 08/01/2016  . Primary osteoarthritis of right knee 04/04/2016  . DVT (deep venous thrombosis) ([place]) 11/09/2015  . Primary osteoarthritis of left knee 10/09/2015  . Generalized anxiety disorder 08/20/2015  . Routine general medical examination at a health care facility 02/11/2015  . Medicare annual wellness visit, subsequent 02/11/2015  . Long term current use of anticoagulant 06/14/2011  . Coronary artery disease   . Hypertension   . Hyperlipidemia   . Depression   . GERD (gastroesophageal reflux disease)   . Insomnia   . Sleep apnea, obstructive   . Pulmonary embolism ([place])     Current Outpatient Medications  Medication Sig Dispense Refill  . ALPRAZolam (XANAX) 1 MG tablet Take 1 tablet (1 mg total) by mouth at bedtime as needed for anxiety or sleep. 30 tablet 0  . amLODipine (NORVASC) 5 MG tablet TAKE 1 TABLET (5 MG TOTAL) DAILY BY MOUTH. AT BEDTIME 90 tablet 3  . atorvastatin (LIPITOR) 40 MG tablet TAKE 1 TABLET BY MOUTH  DAILY 90 tablet 2  . fenofibrate 160 MG tablet TAKE 1 TABLET BY MOUTH  DAILY 90 tablet 1  . HYDROcodone-acetaminophen (NORCO/VICODIN) 5-325 MG tablet Take 1 tablet by mouth every 12 (twelve) hours.     Marland Kitchen omeprazole (PRILOSEC) 20 MG capsule TAKE 1 CAPSULE BY MOUTH  DAILY 90 capsule 1  . polyethylene glycol powder (GLYCOLAX/MIRALAX) powder USING MEASURING CAP MIX  17GM IN WATER AND DRINK  DAILY 1054 g 0  . tiZANidine (ZANAFLEX) 2 MG tablet Take 1 tablet by mouth daily as needed.    . traMADol (ULTRAM) 50 MG tablet Take 1-2 tablets (50-100 mg total) by mouth every 8 (eight) hours as needed. for pain 180 tablet 0  . venlafaxine XR (EFFEXOR-XR) 37.5 MG 24 hr capsule TAKE 1 CAPSULE BY MOUTH  DAILY 90 capsule 1  . XARELTO 20  MG TABS tablet TAKE 1 TABLET BY MOUTH  DAILY WITH SUPPER 90 tablet 1   No current facility-administered medications for this visit.     Allergies: Wellbutrin [bupropion] and Amoxicillin  Past Medical History:  Diagnosis Date  . Anemia    "@ birth"  . Anxiety   . Blood transfusion    "@ birth"  . Cancer ([place])    Head - skin cancer  . Constipation due to pain medication   . Coronary artery disease    DES LAD 2005  . Depression   . Full dentures   . GERD (gastroesophageal reflux disease)   . 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 Heart murmur   . Hyperlipidemia   . Hypertension   . Insomnia   . Osteoarthritis   . PONV (postoperative nausea and vomiting)    2006 - knee surgery  . Pre-diabetes   . Psoriasis   . PTSD (post-traumatic stress disorder)   . Pulmonary embolism ([place]) ~ 03/2006   bilaterally  . Seasonal allergies   . Shortness of breath 06/08/11   "w/exertion; that's why I'm here"  . Sleep apnea, obstructive   . Umbilical hernia     Past Surgical History:  Procedure Laterality Date  . CARDIAC CATHETERIZATION    . carpel tunnel release Bilateral ~ 06/2005/~ 08/2005  right/left  . COLONOSCOPY W/ BIOPSIES AND POLYPECTOMY    . CORONARY ANGIOPLASTY    . CORONARY STENT PLACEMENT  02/13/2004   1  . ENUCLEATION Right    age 38  . EYE SURGERY     age 55; "ruptured  right pupil"  . EYE SURGERY     age 23  . EYE SURGERY  1957   placed artificial right eye  . INGUINAL HERNIA REPAIR Right 1983  . INGUINAL HERNIA REPAIR Left 1985  . KNEE ARTHROSCOPY  ~ 06/2005   right  . MULTIPLE TOOTH EXTRACTIONS    . spinal injections  2006-2007   "maybe 10 injections for herniated discs"  . TONSILLECTOMY     "when I was real young"  . TOTAL KNEE ARTHROPLASTY Left 10/09/2015   Procedure: TOTAL KNEE ARTHROPLASTY;  Surgeon: Dorna Leitz, MD;  Location: [facility];  Service: Orthopedics;  Laterality: Left;  . TOTAL KNEE ARTHROPLASTY Right 04/04/2016    Procedure: TOTAL KNEE ARTHROPLASTY;  Surgeon: Dorna Leitz, MD;  Location: [facility];  Service: Orthopedics;  Laterality: Right;    Family History  Problem Relation Age of Onset  . Asthma Father   . Emphysema Father   . Breast cancer Sister   . Liver cancer Sister   . Asthma Sister   . Asthma Brother     Social History   Tobacco Use  . Smoking status: Former Smoker    Packs/day: 1.50    Years: 25.00    Pack years: 37.50    Types: Cigarettes    Last attempt to quit: 07/10/1995    Years since quitting: 22.8  . Smokeless tobacco: Never Used  Substance Use Topics  . Alcohol use: No     Subjective:  Here today for HTN follow up, at his last OV with me on 02/14/18 his blood pressure was just slightly elevated, no medication changes were made but he was asked to return in 3 months for blood pressure recheck, he is back today for this follow up. He has continued amlodipine 5, losartan-HCTZ 100-25 daily as prescribed, tolerating well, is wanting to buy a BP monitor but saving up money for this. Overall feels well today, does continue to have chronic lower back pain, taking tramadol 50- 2 tabs TID from me, also continued on flexeril, vicodin PRN by ortho Dr Berenice Primas, has recently decreased his flexeril dosage and feels his back pain has remained stable, is scheduled for an SI joint injection next week which he hopes will help his pain.  We will also recheck blood counts, sodium level and A1c today to follow up on elevated HCT, elevated NA+, pre-diabetes noted on 02/14/18 CPE labs He did see cardiology on 03/28/18 for F/U of CAD, HTN, PE/DVT, long term use of anticoagulant since our last visit together as instructed.   BP Readings from Last 3 Encounters:  05/18/18 140/74  03/28/18 (!) 158/60  02/14/18 (!) 142/82    ROS- See HPI  Objective:  Vitals:   05/18/18 1405  BP: 140/74  Pulse: 76  SpO2: 96%  Weight: 256 lb (116.1 kg)  Height: 5' 5.5" (1.664 m)    General: Well developed, well  nourished, in no acute distress  Skin : Warm and dry.  Head: Normocephalic and atraumatic  Eyes: Sclera and conjunctiva clear; pupils round and reactive to light; extraocular movements intact  Oropharynx: Pink, supple. No suspicious lesions  Neck: Supple Lungs: Respirations unlabored; clear to auscultation bilaterally  CVS exam: normal rate, regular rhythm, normal  S1, S2, no murmurs, rubs, clicks or gallops.  Musculoskeletal: No deformities Extremities: No edema, cyanosis Vessels: Symmetric bilaterally  Neurologic: Alert and oriented; speech intact; face symmetrical; moves all extremities well; CNII-XII intact without focal deficit  Psychiatric: Normal mood and affect.  Assessment:  1. Essential hypertension   2. Chronic low back pain, unspecified back pain laterality, unspecified whether sciatica present   3. Pre-diabetes   4. Abnormal CBC   5. Hypernatremia     Plan:   Return in about 3 months (around 08/18/2018) for F/U- chronic  back pain; HTN- recheck BP.  Orders Placed This Encounter  Procedures  . Basic metabolic panel    Standing Status:   Future    Standing Expiration Date:   05/19/2019  . CBC    Standing Status:   Future    Standing Expiration Date:   05/19/2019  . Hemoglobin A1c    Standing Status:   Future    Standing Expiration Date:   05/19/2019    Requested Prescriptions    No prescriptions requested or ordered in this encounter   Abnormal CBC Update CBC F/U with further recommendations pending lab results - CBC; Future  Hypernatremia Update BMET F/U with further recommendations pending lab results - Basic metabolic panel; Future

## 2018-05-18 NOTE — Patient Instructions (Addendum)
Head downstairs for lab work today  I will see you back in 3 months for pain follow up!

## 2018-05-22 DIAGNOSIS — M533 Sacrococcygeal disorders, not elsewhere classified: Secondary | ICD-10-CM | POA: Diagnosis not present

## 2018-06-01 ENCOUNTER — Other Ambulatory Visit: Payer: Self-pay | Admitting: Nurse Practitioner

## 2018-06-04 ENCOUNTER — Other Ambulatory Visit: Payer: Self-pay | Admitting: Nurse Practitioner

## 2018-06-04 MED ORDER — ALPRAZOLAM 1 MG PO TABS: 1.0000 mg | ORAL_TABLET | Freq: Every evening | ORAL | 0 refills | Status: DC | PRN

## 2018-06-04 MED ORDER — TRAMADOL HCL 50 MG PO TABS: 50.0000 mg | ORAL_TABLET | Freq: Three times a day (TID) | ORAL | 0 refills | Status: DC | PRN

## 2018-06-04 NOTE — Telephone Encounter (Signed)
[place] Controlled Database Checked Last filled:  Alprazolam  05/04/18 # 30        Tramadol     05/04/18 # 180 LOV w/you: 05/18/18 Next appt w/you: 08/22/18

## 2018-06-11 ENCOUNTER — Other Ambulatory Visit: Payer: Self-pay | Admitting: *Deleted

## 2018-06-12 DIAGNOSIS — M533 Sacrococcygeal disorders, not elsewhere classified: Secondary | ICD-10-CM | POA: Diagnosis not present

## 2018-06-18 ENCOUNTER — Other Ambulatory Visit: Payer: Self-pay | Admitting: Cardiology

## 2018-06-18 ENCOUNTER — Other Ambulatory Visit: Payer: Self-pay | Admitting: Nurse Practitioner

## 2018-06-20 ENCOUNTER — Other Ambulatory Visit: Payer: Self-pay

## 2018-06-25 DIAGNOSIS — H40012 Open angle with borderline findings, low risk, left eye: Secondary | ICD-10-CM | POA: Diagnosis not present

## 2018-06-25 DIAGNOSIS — Z961 Presence of intraocular lens: Secondary | ICD-10-CM | POA: Diagnosis not present

## 2018-06-25 DIAGNOSIS — Z970 Presence of artificial eye: Secondary | ICD-10-CM | POA: Diagnosis not present

## 2018-06-27 ENCOUNTER — Other Ambulatory Visit: Payer: Self-pay | Admitting: Nurse Practitioner

## 2018-07-02 ENCOUNTER — Other Ambulatory Visit: Payer: Self-pay | Admitting: Nurse Practitioner

## 2018-07-02 MED ORDER — ALPRAZOLAM 1 MG PO TABS: 1.0000 mg | ORAL_TABLET | Freq: Every evening | ORAL | 0 refills | Status: DC | PRN

## 2018-07-02 NOTE — Telephone Encounter (Signed)
Please advise in PCP's absence. Thanks! [place] Controlled Database Checked Last filled: 06/04/18 #30 LOV w/PCP: 05/18/18 Next appt w/PCP: 08/22/18

## 2018-07-05 ENCOUNTER — Other Ambulatory Visit: Payer: Self-pay | Admitting: Nurse Practitioner

## 2018-07-27 ENCOUNTER — Other Ambulatory Visit: Payer: Self-pay | Admitting: Nurse Practitioner

## 2018-07-27 MED ORDER — TRAMADOL HCL 50 MG PO TABS: 50.0000 mg | ORAL_TABLET | Freq: Three times a day (TID) | ORAL | 0 refills | Status: DC | PRN

## 2018-07-27 NOTE — Telephone Encounter (Signed)
Check [place] registry last filled 06/04/2018.Marland KitchenJohny [name]

## 2018-07-31 ENCOUNTER — Other Ambulatory Visit: Payer: Self-pay

## 2018-07-31 NOTE — Patient Outreach (Signed)
[facility]) Care Management  07/31/2018  DEANTAE SHACKLETON February 27, 1946 [id number]   Medication Adherence  call to Mr. [name] patient did not answer patient is due on Telmisartan / HCTZ 80/25 mg. Mr. Orvel Cutsforth is showing past due under [place].   [facility] Management Direct Dial [phone]  Fax [phone] [email or web address]

## 2018-08-02 ENCOUNTER — Telehealth: Payer: Self-pay | Admitting: Nurse Practitioner

## 2018-08-02 ENCOUNTER — Other Ambulatory Visit: Payer: Self-pay | Admitting: Internal Medicine

## 2018-08-02 ENCOUNTER — Other Ambulatory Visit: Payer: Self-pay | Admitting: Nurse Practitioner

## 2018-08-02 MED ORDER — TELMISARTAN-HCTZ 80-25 MG PO TABS: 1.0000 | ORAL_TABLET | Freq: Every day | ORAL | 1 refills | Status: DC

## 2018-08-02 NOTE — Telephone Encounter (Signed)
[place] Controlled Database Checked Last filled: 07/02/18 # 30 LOV w/you:05/18/18 Next appt w/you: 08/22/18

## 2018-08-02 NOTE — Telephone Encounter (Signed)
I spoke to pt- per 05/11/18 tele encounter Losartan/HCTZ was changed to Telmisartan HCT 80-25 mg due to back order. Patient has been taking Micardis HCT since then. Will send refill. See meds. Pt aware to check with pharmacy later today.

## 2018-08-02 NOTE — Telephone Encounter (Signed)
Copied from [place] [id number]. Topic: Quick Communication - Rx Refill/Question >> Aug 02, 2018 11:39 AM Andria Frames L wrote: Medication:telmisartan-hydrochlorothiazide (MICARDIS HCT) 80-25 MG tablet. Pt called and stated that he would like a call back regarding medication. Pt is trying to get refilled but was discontinued. Please advise

## 2018-08-03 MED ORDER — ALPRAZOLAM 1 MG PO TABS: 1.0000 mg | ORAL_TABLET | Freq: Every evening | ORAL | 0 refills | Status: DC | PRN

## 2018-08-22 ENCOUNTER — Encounter: Payer: Self-pay | Admitting: Nurse Practitioner

## 2018-08-22 ENCOUNTER — Other Ambulatory Visit (INDEPENDENT_AMBULATORY_CARE_PROVIDER_SITE_OTHER): Payer: Medicare Other

## 2018-08-22 ENCOUNTER — Ambulatory Visit (INDEPENDENT_AMBULATORY_CARE_PROVIDER_SITE_OTHER): Payer: Medicare Other | Admitting: Nurse Practitioner

## 2018-08-22 VITALS — BP 140/74 | HR 76 | Ht 65.5 in | Wt 256.0 lb

## 2018-08-22 DIAGNOSIS — E870 Hyperosmolality and hypernatremia: Secondary | ICD-10-CM | POA: Diagnosis not present

## 2018-08-22 DIAGNOSIS — R7989 Other specified abnormal findings of blood chemistry: Secondary | ICD-10-CM

## 2018-08-22 DIAGNOSIS — R7303 Prediabetes: Secondary | ICD-10-CM | POA: Diagnosis not present

## 2018-08-22 DIAGNOSIS — G8929 Other chronic pain: Secondary | ICD-10-CM | POA: Diagnosis not present

## 2018-08-22 DIAGNOSIS — I10 Essential (primary) hypertension: Secondary | ICD-10-CM | POA: Diagnosis not present

## 2018-08-22 DIAGNOSIS — M545 Low back pain: Secondary | ICD-10-CM

## 2018-08-22 LAB — CBC
HCT: 38.8 % — ABNORMAL LOW (ref 39.0–52.0)
HEMOGLOBIN: 13.2 g/dL (ref 13.0–17.0)
MCHC: 34.1 g/dL (ref 30.0–36.0)
MCV: 85.9 fl (ref 78.0–100.0)
Platelets: 280 10*3/uL (ref 150.0–400.0)
RBC: 4.51 Mil/uL (ref 4.22–5.81)
RDW: 13.7 % (ref 11.5–15.5)
WBC: 7.2 10*3/uL (ref 4.0–10.5)

## 2018-08-22 LAB — BASIC METABOLIC PANEL
BUN: 24 mg/dL — ABNORMAL HIGH (ref 6–23)
CO2: 28 mEq/L (ref 19–32)
CREATININE: 1.13 mg/dL (ref 0.40–1.50)
Calcium: 9.5 mg/dL (ref 8.4–10.5)
Chloride: 103 mEq/L (ref 96–112)
GFR: 63.66 mL/min (ref >60.00)
Glucose, Bld: 90 mg/dL (ref 70–99)
Potassium: 4.3 mEq/L (ref 3.5–5.1)
Sodium: 138 mEq/L (ref 135–145)

## 2018-08-22 LAB — HEMOGLOBIN A1C: Hgb A1c MFr Bld: 6 % (ref 4.6–6.5)

## 2018-08-22 NOTE — Assessment & Plan Note (Signed)
Continue current medications Continue regular follow up with orthopedics Controlled substance registry reviewed with no irregularities. UDS up to date.

## 2018-08-22 NOTE — Assessment & Plan Note (Signed)
stable Continue current medication

## 2018-08-22 NOTE — Progress Notes (Signed)
Aaron [name] is a 73 y.o. male with the following history as recorded in EpicCare:  Patient Active Problem List   Diagnosis Date Noted  . Pre-diabetes 05/18/2018  . Chronic low back pain 11/26/2017  . Status post total knee replacement 08/15/2016  . Morbid obesity (Aaron [name]) 08/01/2016  . Primary osteoarthritis of right knee 04/04/2016  . DVT (deep venous thrombosis) (Aaron [name]) 11/09/2015  . Primary osteoarthritis of left knee 10/09/2015  . Generalized anxiety disorder 08/20/2015  . Routine general medical examination at a health care facility 02/11/2015  . Medicare annual wellness visit, subsequent 02/11/2015  . Long term current use of anticoagulant 06/14/2011  . Coronary artery disease   . Hypertension   . Hyperlipidemia   . Depression   . GERD (gastroesophageal reflux disease)   . Insomnia   . Sleep apnea, obstructive   . Pulmonary embolism (Aaron [name])     Current Outpatient Medications  Medication Sig Dispense Refill  . ALPRAZolam (XANAX) 1 MG tablet Take 1 tablet (1 mg total) by mouth at bedtime as needed for anxiety or sleep. 30 tablet 0  . amLODipine (NORVASC) 5 MG tablet TAKE 1 TABLET (5 MG TOTAL) DAILY BY MOUTH. AT BEDTIME 90 tablet 3  . atorvastatin (LIPITOR) 40 MG tablet TAKE 1 TABLET BY MOUTH  DAILY 90 tablet 2  . fenofibrate 160 MG tablet TAKE 1 TABLET BY MOUTH  DAILY 90 tablet 1  . HYDROcodone-acetaminophen (NORCO/VICODIN) 5-325 MG tablet Take 1 tablet by mouth every 12 (twelve) hours.     Marland Kitchen omeprazole (PRILOSEC) 20 MG capsule TAKE 1 CAPSULE BY MOUTH  DAILY 90 capsule 1  . polyethylene glycol powder (GLYCOLAX/MIRALAX) powder USING MEASURING CAP MIX  17GM IN WATER AND DRINK  DAILY 1054 g 0  . telmisartan-hydrochlorothiazide (MICARDIS HCT) 80-25 MG tablet Take 1 tablet by mouth daily. 90 tablet 1  . tiZANidine (ZANAFLEX) 2 MG tablet Take 1 tablet by mouth daily as needed.    . traMADol (ULTRAM) 50 MG tablet Take 1-2 tablets (50-100 mg total) by mouth every 8 (eight) hours as  needed. for pain 180 tablet 0  . venlafaxine XR (EFFEXOR-XR) 37.5 MG 24 hr capsule TAKE 1 CAPSULE BY MOUTH  DAILY 90 capsule 1  . XARELTO 20 MG TABS tablet TAKE 1 TABLET BY MOUTH  DAILY WITH SUPPER 90 tablet 1   No current facility-administered medications for this visit.     Allergies: Wellbutrin [bupropion] and Amoxicillin  Past Medical History:  Diagnosis Date  . Anemia    "@ birth"  . Anxiety   . Blood transfusion    "@ birth"  . Cancer (Aaron [name])    Head - skin cancer  . Constipation due to pain medication   . Coronary artery disease    DES LAD 2005  . Depression   . Full dentures   . GERD (gastroesophageal reflux disease)   . 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 Heart murmur   . Hyperlipidemia   . Hypertension   . Insomnia   . Osteoarthritis   . PONV (postoperative nausea and vomiting)    2006 - knee surgery  . Pre-diabetes   . Psoriasis   . PTSD (post-traumatic stress disorder)   . Pulmonary embolism (Aaron [name]) ~ 03/2006   bilaterally  . Seasonal allergies   . Shortness of breath 06/08/11   "w/exertion; that's why I'm here"  . Sleep apnea, obstructive   . Umbilical hernia     Past  Surgical History:  Procedure Laterality Date  . CARDIAC CATHETERIZATION    . carpel tunnel release Bilateral ~ 06/2005/~ 08/2005   right/left  . COLONOSCOPY W/ BIOPSIES AND POLYPECTOMY    . CORONARY ANGIOPLASTY    . CORONARY STENT PLACEMENT  02/13/2004   1  . ENUCLEATION Right    age 67  . EYE SURGERY     age 11; "ruptured  right pupil"  . EYE SURGERY     age 31  . EYE SURGERY  1957   placed artificial right eye  . INGUINAL HERNIA REPAIR Right 1983  . INGUINAL HERNIA REPAIR Left 1985  . KNEE ARTHROSCOPY  ~ 06/2005   right  . MULTIPLE TOOTH EXTRACTIONS    . spinal injections  2006-2007   "maybe 10 injections for herniated discs"  . TONSILLECTOMY     "when I was real young"  . TOTAL KNEE ARTHROPLASTY Left 10/09/2015   Procedure: TOTAL KNEE ARTHROPLASTY;   Surgeon: Aaron Leitz, MD;  Location: [facility];  Service: Orthopedics;  Laterality: Left;  . TOTAL KNEE ARTHROPLASTY Right 04/04/2016   Procedure: TOTAL KNEE ARTHROPLASTY;  Surgeon: Aaron Leitz, MD;  Location: [facility];  Service: Orthopedics;  Laterality: Right;    Family History  Problem Relation Age of Onset  . Asthma Father   . Emphysema Father   . Breast cancer Sister   . Liver cancer Sister   . Asthma Sister   . Asthma Brother     Social History   Tobacco Use  . Smoking status: Former Smoker    Packs/day: 1.50    Years: 25.00    Pack years: 37.50    Types: Cigarettes    Last attempt to quit: 07/10/1995    Years since quitting: 23.1  . Smokeless tobacco: Never Used  Substance Use Topics  . Alcohol use: No     Subjective:  Aaron [name] is here today for routine follow up of HTN, back pain. At his last OV on 05/18/18, CBC, BMET, A1c were ordered but he did not go to have them drawn, says he forgot.  Hypertension -maintained on amlodipine 5, Telmisartan-HCTZ 80-25 daily Reports daily medication compliance without adverse medication effects. No headaches, vision changes, chest pain, shortness of breath, edema.  BP Readings from Last 3 Encounters:  08/22/18 140/74  05/18/18 140/74  03/28/18 (!) 158/60   Back pain-maintained on tramadol 50- 2 tabs TID by me as well as zanaflex, vicodin PRN by ortho Dr Aaron [name] for chronic back/knee pain. Since our last visit on 05/18/18, he did have SI joint injection which he says has helped his pain some , he has actually been able to decreased vicodin dosage some, denies any adverse medication effects and would like to continue tramadol  ROS- See HPI   Objective:  Vitals:   08/22/18 1506  BP: 140/74  Pulse: 76  SpO2: 97%  Weight: 256 lb (116.1 kg)  Height: 5' 5.5" (1.664 m)    General: Well developed, well nourished, in no acute distress  Skin : Warm and dry.  Head: Normocephalic and atraumatic  Eyes: Sclera and conjunctiva clear; pupils  round and reactive to light; extraocular movements intact  Oropharynx: Pink, supple. No suspicious lesions  Neck: Supple  Lungs: Respirations unlabored; clear to auscultation bilaterally  CVS exam: normal rate, regular rhythm, normal S1, S2, no murmurs, rubs, clicks or gallops.  Musculoskeletal: No deformities; normal ROM Extremities: No edema, cyanosis, clubbing  Vessels: Symmetric bilaterally  Neurologic: Alert and oriented; speech intact;  face symmetrical; moves all extremities well; CNII-XII intact without focal deficit  Psychiatric: Normal mood and affect.   Assessment:  1. Chronic low back pain, unspecified back pain laterality, unspecified whether sciatica present   2. Essential hypertension     Plan:  He was Reminded to go to lab for labs today   Return in about 3 months (around 11/20/2018).  No orders of the defined types were placed in this encounter.   Requested Prescriptions    No prescriptions requested or ordered in this encounter

## 2018-08-22 NOTE — Patient Instructions (Addendum)
Head downstairs for labs today  I will see you back in 3 months, or sooner if needed.

## 2018-09-02 ENCOUNTER — Other Ambulatory Visit: Payer: Self-pay | Admitting: Nurse Practitioner

## 2018-09-03 MED ORDER — ALPRAZOLAM 1 MG PO TABS: 1.0000 mg | ORAL_TABLET | Freq: Every evening | ORAL | 0 refills | Status: DC | PRN

## 2018-09-03 NOTE — Telephone Encounter (Signed)
Check [place] registry last filled 08/03/2018,.Marland KitchenJohny [name]

## 2018-09-11 ENCOUNTER — Other Ambulatory Visit: Payer: Self-pay | Admitting: Nurse Practitioner

## 2018-09-13 ENCOUNTER — Other Ambulatory Visit: Payer: Self-pay | Admitting: Nurse Practitioner

## 2018-09-14 ENCOUNTER — Other Ambulatory Visit: Payer: Self-pay | Admitting: Nurse Practitioner

## 2018-09-14 MED ORDER — TRAMADOL HCL 50 MG PO TABS: 50.0000 mg | ORAL_TABLET | Freq: Three times a day (TID) | ORAL | 0 refills | Status: DC | PRN

## 2018-09-14 NOTE — Telephone Encounter (Signed)
[place] Controlled Database Checked Last filled: 07/27/18 # 180 LOV w/PCP: 08/22/18 Next appt w/PCP: 11/21/18

## 2018-10-02 ENCOUNTER — Other Ambulatory Visit: Payer: Self-pay | Admitting: Nurse Practitioner

## 2018-10-02 NOTE — Telephone Encounter (Signed)
Pt informed of below. Virtual visit scheduled with Jodi Mourning, NP 10/03/18 @ 12:00.  Patient wanted to make sure that his chart is updated. He states he is no longer taking Hydrocodone/APAP 5/325.

## 2018-10-02 NOTE — Telephone Encounter (Signed)
I think patient would need virtual visit. He is high risk as getting hydrocodone and tramadol and xanax from providers as recently as Dec.

## 2018-10-02 NOTE — Telephone Encounter (Signed)
[place] Controlled Database Checked Last filled: 09/03/18 # 30 LOV w/PCP: 08/22/18 Next appt: 11/21/18  ?

## 2018-10-03 ENCOUNTER — Ambulatory Visit (INDEPENDENT_AMBULATORY_CARE_PROVIDER_SITE_OTHER): Payer: Medicare Other | Admitting: Family

## 2018-10-03 ENCOUNTER — Encounter: Payer: Self-pay | Admitting: Family

## 2018-10-03 DIAGNOSIS — F411 Generalized anxiety disorder: Secondary | ICD-10-CM

## 2018-10-03 DIAGNOSIS — I824Y9 Acute embolism and thrombosis of unspecified deep veins of unspecified proximal lower extremity: Secondary | ICD-10-CM | POA: Diagnosis not present

## 2018-10-03 DIAGNOSIS — M545 Low back pain, unspecified: Secondary | ICD-10-CM

## 2018-10-03 DIAGNOSIS — G8929 Other chronic pain: Secondary | ICD-10-CM

## 2018-10-03 MED ORDER — ALPRAZOLAM 1 MG PO TABS: 1.0000 mg | ORAL_TABLET | Freq: Every evening | ORAL | 0 refills | Status: DC | PRN

## 2018-10-03 NOTE — Progress Notes (Signed)
Aaron [name] is a 73 y.o. male with the following history as recorded in EpicCare:  Patient Active Problem List   Diagnosis Date Noted  . Pre-diabetes 05/18/2018  . Chronic low back pain 11/26/2017  . Status post total knee replacement 08/15/2016  . Morbid obesity ([place]) 08/01/2016  . Primary osteoarthritis of right knee 04/04/2016  . DVT (deep venous thrombosis) ([place]) 11/09/2015  . Primary osteoarthritis of left knee 10/09/2015  . Generalized anxiety disorder 08/20/2015  . Routine general medical examination at a health care facility 02/11/2015  . Medicare annual wellness visit, subsequent 02/11/2015  . Long term current use of anticoagulant 06/14/2011  . Coronary artery disease   . Hypertension   . Hyperlipidemia   . Depression   . GERD (gastroesophageal reflux disease)   . Insomnia   . Sleep apnea, obstructive   . Pulmonary embolism ([place])     Current Outpatient Medications  Medication Sig Dispense Refill  . ALPRAZolam (XANAX) 1 MG tablet Take 1 tablet (1 mg total) by mouth at bedtime as needed for anxiety or sleep. 30 tablet 0  . amLODipine (NORVASC) 5 MG tablet TAKE 1 TABLET (5 MG TOTAL) DAILY BY MOUTH. AT BEDTIME 90 tablet 3  . atorvastatin (LIPITOR) 40 MG tablet TAKE 1 TABLET BY MOUTH  DAILY 90 tablet 2  . fenofibrate 160 MG tablet TAKE 1 TABLET BY MOUTH  DAILY 90 tablet 1  . omeprazole (PRILOSEC) 20 MG capsule TAKE 1 CAPSULE BY MOUTH  DAILY 90 capsule 1  . polyethylene glycol powder (GLYCOLAX/MIRALAX) powder USING MEASURING CAP MIX  17GM IN WATER AND DRINK  DAILY 1054 g 0  . telmisartan-hydrochlorothiazide (MICARDIS HCT) 80-25 MG tablet Take 1 tablet by mouth daily. 90 tablet 1  . tiZANidine (ZANAFLEX) 2 MG tablet Take 1 tablet by mouth daily as needed.    . traMADol (ULTRAM) 50 MG tablet Take 1-2 tablets (50-100 mg total) by mouth every 8 (eight) hours as needed. for pain 180 tablet 0  . venlafaxine XR (EFFEXOR-XR) 37.5 MG 24 hr capsule TAKE 1 CAPSULE BY MOUTH  DAILY 90  capsule 1  . XARELTO 20 MG TABS tablet TAKE 1 TABLET BY MOUTH  DAILY WITH SUPPER 90 tablet 1   No current facility-administered medications for this visit.     Allergies: Wellbutrin [bupropion] and Amoxicillin  Past Medical History:  Diagnosis Date  . Anemia    "@ birth"  . Anxiety   . Blood transfusion    "@ birth"  . Cancer ([place])    Head - skin cancer  . Constipation due to pain medication   . Coronary artery disease    DES LAD 2005  . Depression   . Full dentures   . GERD (gastroesophageal reflux disease)   . 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 Heart murmur   . Hyperlipidemia   . Hypertension   . Insomnia   . Osteoarthritis   . PONV (postoperative nausea and vomiting)    2006 - knee surgery  . Pre-diabetes   . Psoriasis   . PTSD (post-traumatic stress disorder)   . Pulmonary embolism ([place]) ~ 03/2006   bilaterally  . Seasonal allergies   . Shortness of breath 06/08/11   "w/exertion; that's why I'm here"  . Sleep apnea, obstructive   . Umbilical hernia     Past Surgical History:  Procedure Laterality Date  . CARDIAC CATHETERIZATION    . carpel tunnel release Bilateral ~  06/2005/~ 08/2005   right/left  . COLONOSCOPY W/ BIOPSIES AND POLYPECTOMY    . CORONARY ANGIOPLASTY    . CORONARY STENT PLACEMENT  02/13/2004   1  . ENUCLEATION Right    age 17  . EYE SURGERY     age 2; "ruptured  right pupil"  . EYE SURGERY     age 23  . EYE SURGERY  1957   placed artificial right eye  . INGUINAL HERNIA REPAIR Right 1983  . INGUINAL HERNIA REPAIR Left 1985  . KNEE ARTHROSCOPY  ~ 06/2005   right  . MULTIPLE TOOTH EXTRACTIONS    . spinal injections  2006-2007   "maybe 10 injections for herniated discs"  . TONSILLECTOMY     "when I was real young"  . TOTAL KNEE ARTHROPLASTY Left 10/09/2015   Procedure: TOTAL KNEE ARTHROPLASTY;  Surgeon: Dorna Leitz, MD;  Location: [facility];  Service: Orthopedics;  Laterality: Left;  . TOTAL KNEE ARTHROPLASTY  Right 04/04/2016   Procedure: TOTAL KNEE ARTHROPLASTY;  Surgeon: Dorna Leitz, MD;  Location: [facility];  Service: Orthopedics;  Laterality: Right;    Family History  Problem Relation Age of Onset  . Asthma Father   . Emphysema Father   . Breast cancer Sister   . Liver cancer Sister   . Asthma Sister   . Asthma Brother     Social History   Tobacco Use  . Smoking status: Former Smoker    Packs/day: 1.50    Years: 25.00    Pack years: 37.50    Types: Cigarettes    Last attempt to quit: 07/10/1995    Years since quitting: 23.2  . Smokeless tobacco: Never Used  Substance Use Topics  . Alcohol use: No    Subjective:   I connected with [name] on 10/03/18 at 12:00 PM EDT by a video enabled telemedicine application and verified that I am speaking with the correct person using two identifiers.   I discussed the limitations of evaluation and management by telemedicine and the availability of in person appointments. The patient expressed understanding and agreed to proceed.  Patient's visit was conducted today as a medication review/ follow up; his previous PCP has just left our office and needs to discuss Xanax refill request; is currently on Effexor XR 37.5 mg daily and Xanax 1 mg daily; admits that is struggling with his anxiety;  Takes Tramadol regularly for chronic low back pain; has been released by his orthopedist- no longer taking Norco; uses Tizanidine as needed- has historically gotten that prescription from his orthopedist but may need our office to start prescribing.    Objective:  There were no vitals filed for this visit.  General: Well developed, well nourished, in no acute distress  Lungs: Respirations unlabored;  Neurologic: Alert and oriented; speech intact; face symmetrical;   Assessment:  1. Generalized anxiety disorder   2. Chronic low back pain, unspecified back pain laterality, unspecified whether sciatica present   3. Deep vein thrombosis (DVT) of proximal  lower extremity, unspecified chronicity, unspecified laterality ([place])     Plan:  1. Patient to try increasing his Effexor XR to 75 mg daily; refill is authorized x 1 month of Xanax; patient is to call back with response to increased Effexor in the next 2 weeks; follow up to be determined. 2. Discussed with patient that he will need to sign a pain contract once office visit restrictions are removed; in the interim, will continue to fill Tramadol as previously prescribed;  he asked if he could take more and discussed that could be taken every 6 hours but he would need to let our office know if he is doing that so adjustments to prescription can be made. 3. Continue Xarelto as prescribed; will try to get samples for patient later this summer as it becomes cost prohibitive once he enters the Medicare donut hole.   No follow-ups on file.  No orders of the defined types were placed in this encounter.   Requested Prescriptions   Signed Prescriptions Disp Refills  . ALPRAZolam (XANAX) 1 MG tablet 30 tablet 0    Sig: Take 1 tablet (1 mg total) by mouth at bedtime as needed for anxiety or sleep.

## 2018-10-23 ENCOUNTER — Other Ambulatory Visit: Payer: Self-pay | Admitting: Internal Medicine

## 2018-10-26 ENCOUNTER — Other Ambulatory Visit: Payer: Self-pay | Admitting: Internal Medicine

## 2018-10-26 NOTE — Telephone Encounter (Signed)
Last refill was 09/14/18 Last OV was 10/03/18 with Jodi Mourning Next OV N/A

## 2018-10-30 ENCOUNTER — Telehealth: Payer: Self-pay | Admitting: Nurse Practitioner

## 2018-10-30 NOTE — Telephone Encounter (Signed)
Copied from [place] [id number]. Topic: General - Other >> Oct 30, 2018  1:42 PM Keene Breath wrote: Reason for CRM: Patient called to have two requests:  1) Ptient stated that his traMADol (ULTRAM) 50 MG tablet medication was only for 60 day supply and it is usually for 180 days.  Patient wants to know why it was cut short, 2) Patient also said that Jodi Mourning told him after his evisit to try and take a double dosage of his venlafaxine XR (EFFEXOR-XR) 37.5 MG 24 hr capsule and let her know if that works for him.  Patient said it is doing fine but he will need the amount adjusted through [employer].  Please advise and call patient to get the status of these requests.  CB# [phone]

## 2018-10-31 ENCOUNTER — Other Ambulatory Visit: Payer: Self-pay | Admitting: Family

## 2018-10-31 MED ORDER — VENLAFAXINE HCL ER 75 MG PO CP24: 75.0000 mg | ORAL_CAPSULE | Freq: Every day | ORAL | 1 refills | Status: DC

## 2018-10-31 MED ORDER — RIVAROXABAN 20 MG PO TABS: ORAL_TABLET | ORAL | 1 refills | Status: DC

## 2018-10-31 MED ORDER — OMEPRAZOLE 20 MG PO CPDR: 20.0000 mg | DELAYED_RELEASE_CAPSULE | Freq: Every day | ORAL | 1 refills | Status: DC

## 2018-10-31 MED ORDER — ALPRAZOLAM 1 MG PO TABS: 1.0000 mg | ORAL_TABLET | Freq: Every evening | ORAL | 0 refills | Status: DC | PRN

## 2018-10-31 MED ORDER — FENOFIBRATE 160 MG PO TABS: 160.0000 mg | ORAL_TABLET | Freq: Every day | ORAL | 1 refills | Status: DC

## 2018-11-09 DIAGNOSIS — G4733 Obstructive sleep apnea (adult) (pediatric): Secondary | ICD-10-CM | POA: Diagnosis not present

## 2018-11-21 ENCOUNTER — Ambulatory Visit: Payer: Medicare Other | Admitting: Nurse Practitioner

## 2018-11-27 DIAGNOSIS — M533 Sacrococcygeal disorders, not elsewhere classified: Secondary | ICD-10-CM | POA: Diagnosis not present

## 2018-11-29 ENCOUNTER — Other Ambulatory Visit: Payer: Self-pay | Admitting: Family

## 2018-11-29 ENCOUNTER — Other Ambulatory Visit: Payer: Self-pay | Admitting: Internal Medicine

## 2018-11-30 MED ORDER — ALPRAZOLAM 1 MG PO TABS: 1.0000 mg | ORAL_TABLET | Freq: Every evening | ORAL | 0 refills | Status: DC | PRN

## 2018-11-30 MED ORDER — TRAMADOL HCL 50 MG PO TABS: 50.0000 mg | ORAL_TABLET | Freq: Three times a day (TID) | ORAL | 0 refills | Status: DC | PRN

## 2018-12-31 ENCOUNTER — Other Ambulatory Visit: Payer: Self-pay | Admitting: Family

## 2018-12-31 MED ORDER — TRAMADOL HCL 50 MG PO TABS: 50.0000 mg | ORAL_TABLET | Freq: Three times a day (TID) | ORAL | 0 refills | Status: DC | PRN

## 2018-12-31 MED ORDER — ALPRAZOLAM 1 MG PO TABS: 1.0000 mg | ORAL_TABLET | Freq: Every evening | ORAL | 0 refills | Status: DC | PRN

## 2019-01-07 DIAGNOSIS — M47816 Spondylosis without myelopathy or radiculopathy, lumbar region: Secondary | ICD-10-CM | POA: Diagnosis not present

## 2019-01-11 ENCOUNTER — Ambulatory Visit: Payer: Medicare Other | Admitting: Family

## 2019-01-14 ENCOUNTER — Other Ambulatory Visit: Payer: Self-pay | Admitting: Family

## 2019-01-14 MED ORDER — TELMISARTAN-HCTZ 80-25 MG PO TABS: 1.0000 | ORAL_TABLET | Freq: Every day | ORAL | 1 refills | Status: DC

## 2019-01-16 ENCOUNTER — Other Ambulatory Visit (INDEPENDENT_AMBULATORY_CARE_PROVIDER_SITE_OTHER): Payer: Medicare Other

## 2019-01-16 ENCOUNTER — Other Ambulatory Visit: Payer: Self-pay

## 2019-01-16 ENCOUNTER — Encounter: Payer: Self-pay | Admitting: Family

## 2019-01-16 ENCOUNTER — Ambulatory Visit (INDEPENDENT_AMBULATORY_CARE_PROVIDER_SITE_OTHER): Payer: Medicare Other | Admitting: Family

## 2019-01-16 VITALS — BP 144/78 | HR 84 | Temp 98.1°F | Ht 65.5 in | Wt 254.8 lb

## 2019-01-16 DIAGNOSIS — I10 Essential (primary) hypertension: Secondary | ICD-10-CM

## 2019-01-16 DIAGNOSIS — E7800 Pure hypercholesterolemia, unspecified: Secondary | ICD-10-CM | POA: Diagnosis not present

## 2019-01-16 DIAGNOSIS — R7303 Prediabetes: Secondary | ICD-10-CM | POA: Diagnosis not present

## 2019-01-16 DIAGNOSIS — G8929 Other chronic pain: Secondary | ICD-10-CM

## 2019-01-16 DIAGNOSIS — Z7901 Long term (current) use of anticoagulants: Secondary | ICD-10-CM | POA: Diagnosis not present

## 2019-01-16 DIAGNOSIS — F411 Generalized anxiety disorder: Secondary | ICD-10-CM

## 2019-01-16 DIAGNOSIS — M545 Low back pain: Secondary | ICD-10-CM

## 2019-01-16 LAB — LIPID PANEL
Cholesterol: 199 mg/dL (ref 0–200)
HDL: 42.3 mg/dL (ref >39.00)
LDL Cholesterol: 117 mg/dL — ABNORMAL HIGH (ref 0–99)
NonHDL: 157.15
Total CHOL/HDL Ratio: 5
Triglycerides: 200 mg/dL — ABNORMAL HIGH (ref 0.0–149.0)
VLDL: 40 mg/dL (ref 0.0–40.0)

## 2019-01-16 LAB — CBC WITH DIFFERENTIAL/PLATELET
Basophils Absolute: 0.1 10*3/uL (ref 0.0–0.1)
Basophils Relative: 1.3 % (ref 0.0–3.0)
Eosinophils Absolute: 0.4 10*3/uL (ref 0.0–0.7)
Eosinophils Relative: 5.4 % — ABNORMAL HIGH (ref 0.0–5.0)
HCT: 37.3 % — ABNORMAL LOW (ref 39.0–52.0)
Hemoglobin: 12.4 g/dL — ABNORMAL LOW (ref 13.0–17.0)
Lymphocytes Relative: 24.2 % (ref 12.0–46.0)
Lymphs Abs: 1.8 10*3/uL (ref 0.7–4.0)
MCHC: 33.4 g/dL (ref 30.0–36.0)
MCV: 85.5 fl (ref 78.0–100.0)
Monocytes Absolute: 0.8 10*3/uL (ref 0.1–1.0)
Monocytes Relative: 10 % (ref 3.0–12.0)
Neutro Abs: 4.5 10*3/uL (ref 1.4–7.7)
Neutrophils Relative %: 59.1 % (ref 43.0–77.0)
Platelets: 269 10*3/uL (ref 150.0–400.0)
RBC: 4.36 Mil/uL (ref 4.22–5.81)
RDW: 14 % (ref 11.5–15.5)
WBC: 7.6 10*3/uL (ref 4.0–10.5)

## 2019-01-16 LAB — COMPREHENSIVE METABOLIC PANEL
ALT: 13 U/L (ref 0–53)
AST: 15 U/L (ref 0–37)
Albumin: 4.5 g/dL (ref 3.5–5.2)
Alkaline Phosphatase: 64 U/L (ref 39–117)
BUN: 22 mg/dL (ref 6–23)
CO2: 26 mEq/L (ref 19–32)
Calcium: 9.3 mg/dL (ref 8.4–10.5)
Chloride: 104 mEq/L (ref 96–112)
Creatinine, Ser: 1.12 mg/dL (ref 0.40–1.50)
GFR: 64.24 mL/min (ref >60.00)
Glucose, Bld: 94 mg/dL (ref 70–99)
Potassium: 4.2 mEq/L (ref 3.5–5.1)
Sodium: 139 mEq/L (ref 135–145)
Total Bilirubin: 0.3 mg/dL (ref 0.2–1.2)
Total Protein: 7.4 g/dL (ref 6.0–8.3)

## 2019-01-16 LAB — HEMOGLOBIN A1C: Hgb A1c MFr Bld: 6 % (ref 4.6–6.5)

## 2019-01-16 NOTE — Progress Notes (Signed)
Aaron [name] is a 73 y.o. male with the following history as recorded in EpicCare:  Patient Active Problem List   Diagnosis Date Noted  . Pre-diabetes 05/18/2018  . Chronic low back pain 11/26/2017  . Status post total knee replacement 08/15/2016  . Morbid obesity ([place]) 08/01/2016  . Primary osteoarthritis of right knee 04/04/2016  . DVT (deep venous thrombosis) ([place]) 11/09/2015  . Primary osteoarthritis of left knee 10/09/2015  . Generalized anxiety disorder 08/20/2015  . Routine general medical examination at a health care facility 02/11/2015  . Medicare annual wellness visit, subsequent 02/11/2015  . Long term current use of anticoagulant 06/14/2011  . Coronary artery disease   . Hypertension   . Hyperlipidemia   . Depression   . GERD (gastroesophageal reflux disease)   . Insomnia   . Sleep apnea, obstructive   . Pulmonary embolism ([place])     Current Outpatient Medications  Medication Sig Dispense Refill  . ALPRAZolam (XANAX) 1 MG tablet Take 1 tablet (1 mg total) by mouth at bedtime as needed for anxiety or sleep. 30 tablet 0  . amLODipine (NORVASC) 5 MG tablet TAKE 1 TABLET (5 MG TOTAL) DAILY BY MOUTH. AT BEDTIME 90 tablet 3  . atorvastatin (LIPITOR) 40 MG tablet TAKE 1 TABLET BY MOUTH  DAILY 90 tablet 2  . fenofibrate 160 MG tablet Take 1 tablet (160 mg total) by mouth daily. 90 tablet 1  . Melatonin 5 MG TABS Take 1 tablet by mouth at bedtime as needed.    . meloxicam (MOBIC) 7.5 MG tablet Take 7.5 mg by mouth daily as needed.    Marland Kitchen omeprazole (PRILOSEC) 20 MG capsule Take 1 capsule (20 mg total) by mouth daily. 90 capsule 1  . polyethylene glycol powder (GLYCOLAX/MIRALAX) powder USING MEASURING CAP MIX  17GM IN WATER AND DRINK  DAILY 1054 g 0  . rivaroxaban (XARELTO) 20 MG TABS tablet TAKE 1 TABLET BY MOUTH  DAILY WITH SUPPER 90 tablet 1  . telmisartan-hydrochlorothiazide (MICARDIS HCT) 80-25 MG tablet Take 1 tablet by mouth daily. 90 tablet 1  . traMADol (ULTRAM) 50 MG  tablet Take 1-2 tablets (50-100 mg total) by mouth every 8 (eight) hours as needed. for pain 180 tablet 0  . venlafaxine XR (EFFEXOR-XR) 75 MG 24 hr capsule Take 1 capsule (75 mg total) by mouth daily. 90 capsule 1  . tiZANidine (ZANAFLEX) 2 MG tablet Take 1 tablet by mouth daily as needed.     No current facility-administered medications for this visit.     Allergies: Wellbutrin [bupropion] and Amoxicillin  Past Medical History:  Diagnosis Date  . Anemia    "@ birth"  . Anxiety   . Blood transfusion    "@ birth"  . Cancer ([place])    Head - skin cancer  . Constipation due to pain medication   . Coronary artery disease    DES LAD 2005  . Depression   . Full dentures   . GERD (gastroesophageal reflux disease)   . 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 Heart murmur   . Hyperlipidemia   . Hypertension   . Insomnia   . Osteoarthritis   . PONV (postoperative nausea and vomiting)    2006 - knee surgery  . Pre-diabetes   . Psoriasis   . PTSD (post-traumatic stress disorder)   . Pulmonary embolism ([place]) ~ 03/2006   bilaterally  . Seasonal allergies   . Shortness of breath 06/08/11   "  w/exertion; that's why I'm here"  . Sleep apnea, obstructive   . Umbilical hernia     Past Surgical History:  Procedure Laterality Date  . CARDIAC CATHETERIZATION    . carpel tunnel release Bilateral ~ 06/2005/~ 08/2005   right/left  . COLONOSCOPY W/ BIOPSIES AND POLYPECTOMY    . CORONARY ANGIOPLASTY    . CORONARY STENT PLACEMENT  02/13/2004   1  . ENUCLEATION Right    age 85  . EYE SURGERY     age 82; "ruptured  right pupil"  . EYE SURGERY     age 68  . EYE SURGERY  1957   placed artificial right eye  . INGUINAL HERNIA REPAIR Right 1983  . INGUINAL HERNIA REPAIR Left 1985  . KNEE ARTHROSCOPY  ~ 06/2005   right  . MULTIPLE TOOTH EXTRACTIONS    . spinal injections  2006-2007   "maybe 10 injections for herniated discs"  . TONSILLECTOMY     "when I was real  young"  . TOTAL KNEE ARTHROPLASTY Left 10/09/2015   Procedure: TOTAL KNEE ARTHROPLASTY;  Surgeon: Dorna Leitz, MD;  Location: [facility];  Service: Orthopedics;  Laterality: Left;  . TOTAL KNEE ARTHROPLASTY Right 04/04/2016   Procedure: TOTAL KNEE ARTHROPLASTY;  Surgeon: Dorna Leitz, MD;  Location: [facility];  Service: Orthopedics;  Laterality: Right;    Family History  Problem Relation Age of Onset  . Asthma Father   . Emphysema Father   . Breast cancer Sister   . Liver cancer Sister   . Asthma Sister   . Asthma Brother     Social History   Tobacco Use  . Smoking status: Former Smoker    Packs/day: 1.50    Years: 25.00    Pack years: 37.50    Types: Cigarettes    Quit date: 07/10/1995    Years since quitting: 23.5  . Smokeless tobacco: Never Used  Substance Use Topics  . Alcohol use: No    Subjective:  Follow-up on chronic care needs including: 1) Hypertension; 2) Chronic anxiety- much improved with increased dosage of Effexor; 3) Pre-diabetes- needs Hgba1c updated today; 4) History of DVT/ PE- on lifetime Xarelto; 5) Hyperlipidemia; 6) GERD; 7) Chronic low back pain- will be seeing back specialist later this month for steroid injection;     Objective:  Vitals:   01/16/19 1205  BP: (!) 144/78  Pulse: 84  Temp: 98.1 F (36.7 C)  TempSrc: Oral  SpO2: 98%  Weight: 254 lb 12.8 oz (115.6 kg)  Height: 5' 5.5" (1.664 m)    General: Well developed, well nourished, in no acute distress  Skin : Warm and dry.  Head: Normocephalic and atraumatic  Eyes: Sclera and conjunctiva clear; pupils round and reactive to light; extraocular movements intact  Lungs: Respirations unlabored; clear to auscultation bilaterally without wheeze, rales, rhonchi  CVS exam: normal rate, regular rhythm,  Musculoskeletal: No deformities; no active joint inflammation  Extremities: No edema, cyanosis, clubbing  Vessels: Symmetric bilaterally  Neurologic: Alert and oriented; speech intact; face symmetrical; moves  all extremities well; CNII-XII intact without focal deficit   Assessment:  1. Essential hypertension   2. Pure hypercholesterolemia   3. Pre-diabetes   4. Generalized anxiety disorder   5. Long term current use of anticoagulant   6. Chronic low back pain, unspecified back pain laterality, unspecified whether sciatica present     Plan:  1. Stable; continue same medications; check CBC, CMP; 2. Stable; continue same medication; check lipid panel; 3. Check  Hgba1c; 4. Improved with increased dosage of Effexor XR; 5. Samples given of Xarelto 20 mg daily;  6. Keep planned follow-up with back specialist.   Return in about 6 months (around 07/19/2019).  Orders Placed This Encounter  Procedures  . CBC w/Diff    Standing Status:   Future    Number of Occurrences:   1    Standing Expiration Date:   01/16/2020  . Comp Met (CMET)    Standing Status:   Future    Number of Occurrences:   1    Standing Expiration Date:   01/16/2020  . Lipid panel    Standing Status:   Future    Number of Occurrences:   1    Standing Expiration Date:   01/16/2020  . HgB A1c    Standing Status:   Future    Number of Occurrences:   1    Standing Expiration Date:   01/16/2020    Requested Prescriptions    No prescriptions requested or ordered in this encounter

## 2019-01-20 ENCOUNTER — Other Ambulatory Visit: Payer: Self-pay | Admitting: Cardiology

## 2019-01-29 ENCOUNTER — Other Ambulatory Visit: Payer: Self-pay | Admitting: Family

## 2019-01-29 DIAGNOSIS — M47816 Spondylosis without myelopathy or radiculopathy, lumbar region: Secondary | ICD-10-CM | POA: Diagnosis not present

## 2019-01-30 MED ORDER — ALPRAZOLAM 1 MG PO TABS: 1.0000 mg | ORAL_TABLET | Freq: Every evening | ORAL | 1 refills | Status: DC | PRN

## 2019-01-30 MED ORDER — TRAMADOL HCL 50 MG PO TABS: 50.0000 mg | ORAL_TABLET | Freq: Three times a day (TID) | ORAL | 1 refills | Status: DC | PRN

## 2019-03-11 ENCOUNTER — Other Ambulatory Visit: Payer: Self-pay | Admitting: Family

## 2019-03-25 ENCOUNTER — Other Ambulatory Visit: Payer: Self-pay | Admitting: Family

## 2019-03-25 DIAGNOSIS — I10 Essential (primary) hypertension: Secondary | ICD-10-CM

## 2019-03-25 MED ORDER — AMLODIPINE BESYLATE 5 MG PO TABS: 5.0000 mg | ORAL_TABLET | Freq: Every day | ORAL | 3 refills | Status: DC

## 2019-04-02 ENCOUNTER — Other Ambulatory Visit: Payer: Self-pay | Admitting: Family

## 2019-05-06 ENCOUNTER — Other Ambulatory Visit: Payer: Self-pay

## 2019-05-06 ENCOUNTER — Ambulatory Visit: Payer: Medicare Other | Admitting: Pulmonary Disease

## 2019-05-06 ENCOUNTER — Encounter: Payer: Self-pay | Admitting: Pulmonary Disease

## 2019-05-06 VITALS — BP 142/64 | HR 77 | Ht 65.5 in | Wt 260.8 lb

## 2019-05-06 DIAGNOSIS — Z86711 Personal history of pulmonary embolism: Secondary | ICD-10-CM | POA: Insufficient documentation

## 2019-05-06 DIAGNOSIS — G4733 Obstructive sleep apnea (adult) (pediatric): Secondary | ICD-10-CM

## 2019-05-06 DIAGNOSIS — Z7901 Long term (current) use of anticoagulants: Secondary | ICD-10-CM | POA: Diagnosis not present

## 2019-05-06 DIAGNOSIS — Z86718 Personal history of other venous thrombosis and embolism: Secondary | ICD-10-CM | POA: Insufficient documentation

## 2019-05-06 DIAGNOSIS — F411 Generalized anxiety disorder: Secondary | ICD-10-CM | POA: Diagnosis not present

## 2019-05-06 NOTE — Patient Instructions (Addendum)
You were seen today by Lauraine Rinne, NP  for:   1. Sleep apnea, obstructive  New CPAP order today Same settings Supplies Follow-up with our office in 3 months with Dr. Halford Chessman  We recommend that you continue using your CPAP daily >>>Keep up the hard work using your device >>> Goal should be wearing this for the entire night that you are sleeping, at least 4 to 6 hours  Remember:  . Do not drive or operate heavy machinery if tired or drowsy.  . Please notify the supply company and office if you are unable to use your device regularly due to missing supplies or machine being broken.  . Work on maintaining a healthy weight and following your recommended nutrition plan  . Maintain proper daily exercise and movement  . Maintaining proper use of your device can also help improve management of other chronic illnesses such as: Blood pressure, blood sugars, and weight management.   BiPAP/ CPAP Cleaning:  >>>Clean weekly, with Dawn soap, and bottle brush.  Set up to air dry.   Work on improving sleep hygiene:   Sleep habits   Keep a sleep diary to help you and your health care provider figure out what could be causing your insomnia. Write down: ? When you sleep. ? When you wake up during the night. ? How well you sleep. ? How rested you feel the next day. ? Any side effects of medicines you are taking. ? What you eat and drink.  Make your bedroom a dark, comfortable place where it is easy to fall asleep. ? Put up shades or blackout curtains to block light from outside. ? Use a white noise machine to block noise. ? Keep the temperature cool.  Limit screen use before bedtime. This includes: ? Watching TV. ? Using your smartphone, tablet, or computer.  Stick to a routine that includes going to bed and waking up at the same times every day and night. This can help you fall asleep faster. Consider making a quiet activity, such as reading, part of your nighttime routine.  Try to avoid  taking naps during the day so that you sleep better at night.  Get out of bed if you are still awake after 15 minutes of trying to sleep. Keep the lights down, but try reading or doing a quiet activity. When you feel sleepy, go back to bed.   Increase Melatonin to 10mg  nightly   Can use alprazolam as ordered by primary care   2. Generalized anxiety disorder  Continue follow-up with primary care Can use alprazolam as ordered by primary care  3. Long term current use of anticoagulant  Continue Xarelto  4. History of DVT (deep vein thrombosis)  Continue Xarelto  5. History of pulmonary embolus (PE)  Continue Xarelto    Follow Up:    Return in about 3 months (around 08/06/2019), or if symptoms worsen or fail to improve, for Follow up with Dr. Halford Chessman.   Please do your part to reduce the spread of COVID-19:      Reduce your risk of any infection  and COVID19 by using the similar precautions used for avoiding the common cold or flu:  Marland Kitchen Wash your hands often with soap and warm water for at least 20 seconds.  If soap and water are not readily available, use an alcohol-based hand sanitizer with at least 60% alcohol.  . If coughing or sneezing, cover your mouth and nose by coughing or sneezing into the  elbow areas of your shirt or coat, into a tissue or into your sleeve (not your hands). Langley Gauss A MASK when in public  . Avoid shaking hands with others and consider head nods or verbal greetings only. . Avoid touching your eyes, nose, or mouth with unwashed hands.  . Avoid close contact with people who are sick. . Avoid places or events with large numbers of people in one location, like concerts or sporting events. . If you have some symptoms but not all symptoms, continue to monitor at home and seek medical attention if your symptoms worsen. . If you are having a medical emergency, call 911.   [employer] / e-Visit:  [email or web address]         [facility]: [facility]: [id number]                   [facility]: [id number]     It is flu season:   >>> Best ways to protect herself from the flu: Receive the yearly flu vaccine, practice good hand hygiene washing with soap and also using hand sanitizer when available, eat a nutritious meals, get adequate rest, hydrate appropriately   Please contact the office if your symptoms worsen or you have concerns that you are not improving.   Thank you for choosing [facility] for your healthcare, and for allowing [place] to partner with you on your healthcare journey. I am thankful to be able to provide care to you today.   Wyn Quaker FNP-C

## 2019-05-06 NOTE — Assessment & Plan Note (Signed)
Plan: Continue Xarelto 

## 2019-05-06 NOTE — Progress Notes (Signed)
@Patient  ID: Aaron [name], male    DOB: 1945/07/25, 73 y.o.   MRN: [id number]  Chief Complaint  Patient presents with  . Follow-up    OSA/CPAP follow up, machine over 33-36 years old, DME [employer]     Referring provider: [name],*  HPI:  73 year old male former smoker followed in our office for severe obstructive sleep apnea  PMH: Hypertension, CAD, hyperlipidemia, depression, GERD, insomnia, history of PE, history of DVT, morbid obesity, prediabetes, anxiety Smoker/ Smoking History: Former smoker.  Quit 1997.  37.5-pack-year smoking history Maintenance: None Pt of: Dr. Halford Chessman  05/06/2019  - Visit   73 year old male former smoker followed in our office for obstructive sleep apnea.  Patient is completing a follow-up with our office today.  Patient CPAP compliance report shows excellent compliance.  See compliance report listed below:  04/03/2019-05/02/2019-all 30 days used, 27 of those days greater than 4 hours, average usage 7 hours and 39 minutes, APAP setting 5-20, 95th percentile 12, AHI 0.6  Patient reports he is doing well with CPAP management.  He does still continue to struggle with initiating sleep.  He has Xanax from his primary care doctor to help with initiating sleep and management of anxiety.  He is also using 5 mg of melatonin.  He is unsure if he can go up to 10 mg of melatonin.  Tests:   Pulmonary tests: 06/08/11 CT chest >> b/l PE, ATX, LUL 15mm nodule  12/11/11 CT chest >> LUL nodule resolved, but new b/l nodules up to 8 mm  06/20/12 CT chest >> no change  06/20/13 CT chest >> no change  Sleep tests: PSG 09/22/13 >> AHI 5, RDI 61.4, SaO2 low 90%  FENO:  No results found for: NITRICOXIDE  PFT: No flowsheet data found.  WALK:  No flowsheet data found.  Imaging: No results found.  Lab Results:  CBC    Component Value Date/Time   WBC 7.6 01/16/2019 1234   RBC 4.36 01/16/2019 1234   HGB 12.4 (L) 01/16/2019 1234   HGB 13.8 05/15/2007 1520    HCT 37.3 (L) 01/16/2019 1234   HCT 39.6 05/15/2007 1520   PLT 269.0 01/16/2019 1234   PLT 221 05/15/2007 1520   MCV 85.5 01/16/2019 1234   MCV 83.3 05/15/2007 1520   MCH 27.7 04/06/2016 0617   MCHC 33.4 01/16/2019 1234   RDW 14.0 01/16/2019 1234   RDW 14.5 05/15/2007 1520   LYMPHSABS 1.8 01/16/2019 1234   LYMPHSABS 1.2 05/15/2007 1520   MONOABS 0.8 01/16/2019 1234   MONOABS 0.6 05/15/2007 1520   EOSABS 0.4 01/16/2019 1234   EOSABS 0.1 05/15/2007 1520   BASOSABS 0.1 01/16/2019 1234   BASOSABS 0.0 05/15/2007 1520    BMET    Component Value Date/Time   NA 139 01/16/2019 1234   K 4.2 01/16/2019 1234   CL 104 01/16/2019 1234   CO2 26 01/16/2019 1234   GLUCOSE 94 01/16/2019 1234   BUN 22 01/16/2019 1234   CREATININE 1.12 01/16/2019 1234   CALCIUM 9.3 01/16/2019 1234   GFRNONAA >60 04/05/2016 0838   GFRAA >60 04/05/2016 0838    BNP No results found for: BNP  ProBNP    Component Value Date/Time   PROBNP 2074.0 (H) 06/08/2011 1144    Specialty Problems      Pulmonary Problems   Sleep apnea, obstructive    PSG 09/22/13 >> AHI 5, RDI 61.4, SaO2 low 90%         Allergies  Allergen  Reactions  . Wellbutrin [Bupropion] Other (See Comments)    Hallucinations, sweating  . Amoxicillin Palpitations    Immunization History  Administered Date(s) Administered  . Influenza Split 05/05/2011, 04/03/2013  . Influenza Whole 04/30/2012  . Influenza, High Dose Seasonal PF 08/10/2015, 03/08/2016, 04/20/2017, 04/24/2018, 03/26/2019  . Pneumococcal Conjugate-13 02/11/2015  . Pneumococcal Polysaccharide-23 06/09/2011  . Td 02/11/2015    Past Medical History:  Diagnosis Date  . Anemia    "@ birth"  . Anxiety   . Blood transfusion    "@ birth"  . Cancer ([place])    Head - skin cancer  . Constipation due to pain medication   . Coronary artery disease    DES LAD 2005  . Depression   . Full dentures   . GERD (gastroesophageal reflux disease)   . 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 Heart murmur   . Hyperlipidemia   . Hypertension   . Insomnia   . Osteoarthritis   . PONV (postoperative nausea and vomiting)    2006 - knee surgery  . Pre-diabetes   . Psoriasis   . PTSD (post-traumatic stress disorder)   . Pulmonary embolism ([place]) ~ 03/2006   bilaterally  . Seasonal allergies   . Shortness of breath 06/08/11   "w/exertion; that's why I'm here"  . Sleep apnea, obstructive   . Umbilical hernia     Tobacco History: Social History   Tobacco Use  Smoking Status Former Smoker  . Packs/day: 1.50  . Years: 25.00  . Pack years: 37.50  . Types: Cigarettes  . Quit date: 07/10/1995  . Years since quitting: 23.8  Smokeless Tobacco Never Used   Counseling given: Yes   Continue to not smoke  Outpatient Encounter Medications as of 05/06/2019  Medication Sig  . ALPRAZolam (XANAX) 1 MG tablet TAKE 1 TABLET (1 MG TOTAL) BY MOUTH AT BEDTIME AS NEEDED FOR ANXIETY OR SLEEP.  Marland Kitchen amLODipine (NORVASC) 5 MG tablet Take 1 tablet (5 mg total) by mouth daily. At bedtime  . atorvastatin (LIPITOR) 40 MG tablet TAKE 1 TABLET BY MOUTH  DAILY  . fenofibrate 160 MG tablet TAKE 1 TABLET BY MOUTH  DAILY  . Melatonin 5 MG TABS Take 1 tablet by mouth at bedtime as needed.  Marland Kitchen omeprazole (PRILOSEC) 20 MG capsule Take 1 capsule (20 mg total) by mouth daily.  Marland Kitchen telmisartan-hydrochlorothiazide (MICARDIS HCT) 80-25 MG tablet Take 1 tablet by mouth daily.  . traMADol (ULTRAM) 50 MG tablet TAKE 1-2 TABLETS (50-100 MG TOTAL) BY MOUTH EVERY 8 (EIGHT) HOURS AS NEEDED. FOR PAIN  . venlafaxine XR (EFFEXOR-XR) 75 MG 24 hr capsule TAKE 1 CAPSULE BY MOUTH  DAILY  . XARELTO 20 MG TABS tablet TAKE 1 TABLET BY MOUTH  DAILY WITH SUPPER  . polyethylene glycol powder (GLYCOLAX/MIRALAX) powder USING MEASURING CAP MIX  17GM IN WATER AND DRINK  DAILY  . [DISCONTINUED] meloxicam (MOBIC) 7.5 MG tablet Take 7.5 mg by mouth daily as needed.  . [DISCONTINUED] tiZANidine  (ZANAFLEX) 2 MG tablet Take 1 tablet by mouth daily as needed.   No facility-administered encounter medications on file as of 05/06/2019.      Review of Systems  Review of Systems  Constitutional: Negative for activity change, chills, fatigue, fever and unexpected weight change.  HENT: Negative for postnasal drip, rhinorrhea, sinus pressure, sinus pain and sore throat.   Eyes: Negative.   Respiratory: Negative for cough, shortness of breath and wheezing.  Cardiovascular: Negative for chest pain and palpitations.  Gastrointestinal: Negative for constipation, diarrhea, nausea and vomiting.  Endocrine: Negative.   Genitourinary: Negative.   Musculoskeletal: Negative.   Skin: Negative.   Neurological: Negative for dizziness and headaches.  Psychiatric/Behavioral: Positive for sleep disturbance. Negative for dysphoric mood. The patient is not nervous/anxious.   All other systems reviewed and are negative.    Physical Exam  BP (!) 142/64 (BP Location: Left Arm, Cuff Size: Normal)   Pulse 77   Ht 5' 5.5" (1.664 m)   Wt 260 lb 12.8 oz (118.3 kg)   SpO2 98%   BMI 42.74 kg/m   Wt Readings from Last 5 Encounters:  05/06/19 260 lb 12.8 oz (118.3 kg)  01/16/19 254 lb 12.8 oz (115.6 kg)  08/22/18 256 lb (116.1 kg)  05/18/18 256 lb (116.1 kg)  03/28/18 252 lb 3.2 oz (114.4 kg)    BMI Readings from Last 5 Encounters:  05/06/19 42.74 kg/m  01/16/19 41.76 kg/m  08/22/18 41.95 kg/m  05/18/18 41.95 kg/m  03/28/18 41.33 kg/m     Physical Exam Vitals signs and nursing note reviewed.  Constitutional:      General: He is not in acute distress.    Appearance: Normal appearance. He is obese.  HENT:     Head: Normocephalic and atraumatic.     Right Ear: Hearing, tympanic membrane, ear canal and external ear normal.     Left Ear: Hearing, tympanic membrane, ear canal and external ear normal.     Nose: Nose normal. No mucosal edema or rhinorrhea.     Right Turbinates: Not  enlarged.     Left Turbinates: Not enlarged.     Mouth/Throat:     Mouth: Mucous membranes are dry.     Pharynx: Oropharynx is clear. No oropharyngeal exudate.  Eyes:     Pupils: Pupils are equal, round, and reactive to light.  Neck:     Musculoskeletal: Normal range of motion.  Cardiovascular:     Rate and Rhythm: Normal rate and regular rhythm.     Pulses: Normal pulses.     Heart sounds: Normal heart sounds. No murmur.  Pulmonary:     Effort: Pulmonary effort is normal.     Breath sounds: Normal breath sounds. No decreased breath sounds, wheezing or rales.  Musculoskeletal:     Right lower leg: No edema.     Left lower leg: No edema.  Lymphadenopathy:     Cervical: No cervical adenopathy.  Skin:    General: Skin is warm and dry.     Capillary Refill: Capillary refill takes less than 2 seconds.     Findings: No erythema or rash.  Neurological:     General: No focal deficit present.     Mental Status: He is alert and oriented to person, place, and time.     Motor: No weakness.     Coordination: Coordination normal.     Gait: Gait is intact. Gait normal.  Psychiatric:        Mood and Affect: Mood normal.        Behavior: Behavior normal. Behavior is cooperative.        Thought Content: Thought content normal.        Judgment: Judgment normal.       Assessment & Plan:   Sleep apnea, obstructive Plan: Continue CPAP therapy Follow-up with our office in 3 months Can increase melatonin to 10 mg nightly Practice good sleep hygiene Can continue to use her proexam  as managed by primary care  Generalized anxiety disorder Plan: Continue follow-up with primary care Continue alprazolam as managed by primary care  History of DVT (deep vein thrombosis) Plan: Continue Xarelto  History of pulmonary embolus (PE) Plan: Continue Xarelto  Long term current use of anticoagulant Plan: Continue Xarelto    Return in about 3 months (around 08/06/2019), or if symptoms worsen  or fail to improve, for Follow up with Dr. Halford Chessman.   Lauraine Rinne, NP 05/06/2019   This appointment was 28 minutes long with over 50% of the time in direct face-to-face patient care, assessment, plan of care, and follow-up.

## 2019-05-06 NOTE — Assessment & Plan Note (Signed)
Plan: Continue CPAP therapy Follow-up with our office in 3 months Can increase melatonin to 10 mg nightly Practice good sleep hygiene Can continue to use her proexam as managed by primary care

## 2019-05-06 NOTE — Progress Notes (Signed)
Reviewed and agree with assessment/plan.   Jeran Hiltz, MD [facility] 06/29/2016, 12:24 PM Pager:  [phone]  

## 2019-05-06 NOTE — Assessment & Plan Note (Signed)
Plan: Continue follow-up with primary care Continue alprazolam as managed by primary care

## 2019-05-09 DIAGNOSIS — M25562 Pain in left knee: Secondary | ICD-10-CM | POA: Diagnosis not present

## 2019-05-09 DIAGNOSIS — S8002XA Contusion of left knee, initial encounter: Secondary | ICD-10-CM | POA: Diagnosis not present

## 2019-05-15 DIAGNOSIS — G4733 Obstructive sleep apnea (adult) (pediatric): Secondary | ICD-10-CM | POA: Diagnosis not present

## 2019-05-16 DIAGNOSIS — M79672 Pain in left foot: Secondary | ICD-10-CM | POA: Diagnosis not present

## 2019-05-16 DIAGNOSIS — L600 Ingrowing nail: Secondary | ICD-10-CM | POA: Diagnosis not present

## 2019-05-16 DIAGNOSIS — L603 Nail dystrophy: Secondary | ICD-10-CM | POA: Diagnosis not present

## 2019-05-16 DIAGNOSIS — M79671 Pain in right foot: Secondary | ICD-10-CM | POA: Diagnosis not present

## 2019-05-16 DIAGNOSIS — B351 Tinea unguium: Secondary | ICD-10-CM | POA: Diagnosis not present

## 2019-05-16 LAB — HM DIABETES FOOT EXAM

## 2019-05-24 DIAGNOSIS — G4733 Obstructive sleep apnea (adult) (pediatric): Secondary | ICD-10-CM | POA: Diagnosis not present

## 2019-05-26 NOTE — Progress Notes (Signed)
Cardiology Office Note:    Date:  05/29/2019   ID:  Aaron [name], DOB July 13, 1945, MRN [id number]  PCP:  [name], FNP  Cardiologist:  Gizella [place]ville Martin[place], MD   Referring MD: [name],*   Chief Complaint  Patient presents with  . Coronary Artery Disease    History of Present Illness:    Aaron [name] is a 73 y.o. male with a hx of hypertension, coronary artery disease, recurrent PE/DVTs on xarelto, GERD, hyperlipidemia, and obesity.  He has a history of stenting to his mid LAD in 2005 following a nuclear stress test that showed evidence of anterior apical ischemia.  Follow-up Myoview in 2014 was normal.  He was anticoagulated on Xarelto.    He is seen for yearly follow up. He is doing very well from a cardiac standpoint. No chest pain or dyspnea. He had some diarrhea last week after being put on a new medication for his feet. This has resolved.   Past Medical History:  Diagnosis Date  . Anemia    "@ birth"  . Anxiety   . Blood transfusion    "@ birth"  . Cancer ([place])    Head - skin cancer  . Constipation due to pain medication   . Coronary artery disease    DES LAD 2005  . Depression   . Full dentures   . GERD (gastroesophageal reflux disease)   . 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 Heart murmur   . Hyperlipidemia   . Hypertension   . Insomnia   . Osteoarthritis   . PONV (postoperative nausea and vomiting)    2006 - knee surgery  . Pre-diabetes   . Psoriasis   . PTSD (post-traumatic stress disorder)   . Pulmonary embolism ([place]) ~ 03/2006   bilaterally  . Seasonal allergies   . Shortness of breath 06/08/11   "w/exertion; that's why I'm here"  . Sleep apnea, obstructive   . Umbilical hernia     Past Surgical History:  Procedure Laterality Date  . CARDIAC CATHETERIZATION    . carpel tunnel release Bilateral ~ 06/2005/~ 08/2005   right/left  . COLONOSCOPY W/ BIOPSIES AND POLYPECTOMY    . CORONARY  ANGIOPLASTY    . CORONARY STENT PLACEMENT  02/13/2004   1  . ENUCLEATION Right    age 59  . EYE SURGERY     age 30; "ruptured  right pupil"  . EYE SURGERY     age 33  . EYE SURGERY  1957   placed artificial right eye  . INGUINAL HERNIA REPAIR Right 1983  . INGUINAL HERNIA REPAIR Left 1985  . KNEE ARTHROSCOPY  ~ 06/2005   right  . MULTIPLE TOOTH EXTRACTIONS    . spinal injections  2006-2007   "maybe 10 injections for herniated discs"  . TONSILLECTOMY     "when I was real young"  . TOTAL KNEE ARTHROPLASTY Left 10/09/2015   Procedure: TOTAL KNEE ARTHROPLASTY;  Surgeon: Dorna Leitz, MD;  Location: [facility];  Service: Orthopedics;  Laterality: Left;  . TOTAL KNEE ARTHROPLASTY Right 04/04/2016   Procedure: TOTAL KNEE ARTHROPLASTY;  Surgeon: Dorna Leitz, MD;  Location: [facility];  Service: Orthopedics;  Laterality: Right;    Current Medications: Current Meds  Medication Sig  . ALPRAZolam (XANAX) 1 MG tablet TAKE 1 TABLET (1 MG TOTAL) BY MOUTH AT BEDTIME AS NEEDED FOR ANXIETY OR SLEEP.  Marland Kitchen amLODipine (NORVASC) 5 MG tablet Take  1 tablet (5 mg total) by mouth daily. At bedtime  . fenofibrate 160 MG tablet TAKE 1 TABLET BY MOUTH  DAILY  . Melatonin 5 MG TABS Take 1 tablet by mouth at bedtime as needed.  Marland Kitchen omeprazole (PRILOSEC) 20 MG capsule Take 1 capsule (20 mg total) by mouth daily.  Marland Kitchen telmisartan-hydrochlorothiazide (MICARDIS HCT) 80-25 MG tablet Take 1 tablet by mouth daily.  . traMADol (ULTRAM) 50 MG tablet TAKE 1-2 TABLETS (50-100 MG TOTAL) BY MOUTH EVERY 8 (EIGHT) HOURS AS NEEDED. FOR PAIN  . venlafaxine XR (EFFEXOR-XR) 75 MG 24 hr capsule TAKE 1 CAPSULE BY MOUTH  DAILY  . XARELTO 20 MG TABS tablet TAKE 1 TABLET BY MOUTH  DAILY WITH SUPPER  . [DISCONTINUED] atorvastatin (LIPITOR) 40 MG tablet TAKE 1 TABLET BY MOUTH  DAILY     Allergies:   Wellbutrin [bupropion] and Amoxicillin   Social History   Socioeconomic History  . Marital status: Married    Spouse name: Not on file  . Number of  children: 3  . Years of education: Not on file  . Highest education level: Not on file  Occupational History  . Occupation: building maintenance    Comment: disabled  Social Needs  . Financial resource strain: Not on file  . Food insecurity    Worry: Not on file    Inability: Not on file  . Transportation needs    Medical: Not on file    Non-medical: Not on file  Tobacco Use  . Smoking status: Former Smoker    Packs/day: 1.50    Years: 25.00    Pack years: 37.50    Types: Cigarettes    Quit date: 07/10/1995    Years since quitting: 23.9  . Smokeless tobacco: Never Used  Substance and Sexual Activity  . Alcohol use: No  . Drug use: No  . Sexual activity: Not on file  Lifestyle  . Physical activity    Days per week: Not on file    Minutes per session: Not on file  . Stress: Not on file  Relationships  . Social Herbalist on phone: Not on file    Gets together: Not on file    Attends religious service: Not on file    Active member of club or organization: Not on file    Attends meetings of clubs or organizations: Not on file    Relationship status: Not on file  Other Topics Concern  . 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   Recent stress, currently undergoing financial difficulties.     Family History: The patient's family history includes Asthma in his brother, father, and sister; Breast cancer in his sister; Emphysema in his father; Liver cancer in his sister.  ROS:   Please see the history of present illness.     All other systems reviewed and are negative.  EKGs/Labs/Other Studies Reviewed:    The following studies were reviewed today:  none  EKG:  EKG is ordered today.  The ekg ordered today demonstrates sinus rhythm rate 74. Normal Ecg. I have personally reviewed and interpreted this study.   Recent Labs: 01/16/2019: ALT 13; BUN 22; Creatinine, Ser 1.12; Hemoglobin 12.4; Platelets  269.0; Potassium 4.2; Sodium 139  Recent Lipid Panel    Component Value Date/Time   CHOL 199 01/16/2019 1234   TRIG 200.0 (H) 01/16/2019 1234   HDL 42.30 01/16/2019 1234  CHOLHDL 5 01/16/2019 1234   VLDL 40.0 01/16/2019 1234   LDLCALC 117 (H) 01/16/2019 1234    Physical Exam:    VS:  BP (!) 146/70 (BP Location: Right Arm, Cuff Size: Large)   Pulse 74   Ht 5' 5.5" (1.664 m)   Wt 254 lb 3.2 oz (115.3 kg)   SpO2 97%   BMI 41.66 kg/m     Wt Readings from Last 3 Encounters:  05/29/19 254 lb 3.2 oz (115.3 kg)  05/06/19 260 lb 12.8 oz (118.3 kg)  01/16/19 254 lb 12.8 oz (115.6 kg)     GEN: Well nourished, overweight,  in no acute distress HEENT: Normal NECK: No JVD; No carotid bruits CARDIAC: RRR, no murmurs, rubs, gallops RESPIRATORY:  Clear to auscultation without rales, wheezing or rhonchi  ABDOMEN: Soft, non-tender, non-distended MUSCULOSKELETAL:  No edema; No deformity  SKIN: Warm and dry NEUROLOGIC:  Alert and oriented x 3 PSYCHIATRIC:  Normal affect   ASSESSMENT:    1. Coronary artery disease involving native coronary artery of native heart without angina pectoris   2. Hypercholesteremia   3. Essential hypertension    PLAN:    In order of problems listed above:  1. Coronary artery disease involving native coronary artery of native heart without angina pectoris  Remote stenting of the LAD in 2005. Not on ASA due to need for anticoagulation. Continue statin.   2. Essential hypertension Fairly well controlled on current regimen.  3. Other pulmonary embolism without acute cor pulmonale, unspecified chronicity (HCC) Deep vein thrombosis (DVT) of proximal lower extremity, unspecified chronicity, unspecified laterality ([place]) Long term current use of anticoagulant He is compliant on Xarelto.   4. Hypercholesterolemia. Goal LDL < 70. Recommend increasing lipitor to 80 mg daily. Focus on weight loss, increased aerobic activity and healthy diet. To have lab work  follow up with primary care.    Follow-up in 1 year.   Medication Adjustments/Labs and Tests Ordered: Current medicines are reviewed at length with the patient today.  Concerns regarding medicines are outlined above.  Orders Placed This Encounter  Procedures  . EKG 12-Lead   Meds ordered this encounter  Medications  . atorvastatin (LIPITOR) 80 MG tablet    Sig: Take 1 tablet (80 mg total) by mouth daily.    Dispense:  90 tablet    Refill:  3    Signed, Yen Wandell Martinique, MD  05/29/2019 3:45 PM    [place]

## 2019-05-29 ENCOUNTER — Encounter: Payer: Self-pay | Admitting: Cardiology

## 2019-05-29 ENCOUNTER — Ambulatory Visit: Payer: Medicare Other | Admitting: Cardiology

## 2019-05-29 ENCOUNTER — Other Ambulatory Visit: Payer: Self-pay

## 2019-05-29 VITALS — BP 146/70 | HR 74 | Ht 65.5 in | Wt 254.2 lb

## 2019-05-29 DIAGNOSIS — E7800 Pure hypercholesterolemia, unspecified: Secondary | ICD-10-CM | POA: Diagnosis not present

## 2019-05-29 DIAGNOSIS — I2510 Atherosclerotic heart disease of native coronary artery without angina pectoris: Secondary | ICD-10-CM

## 2019-05-29 DIAGNOSIS — I10 Essential (primary) hypertension: Secondary | ICD-10-CM | POA: Diagnosis not present

## 2019-05-29 MED ORDER — ATORVASTATIN CALCIUM 80 MG PO TABS: 80.0000 mg | ORAL_TABLET | Freq: Every day | ORAL | 3 refills | Status: DC

## 2019-05-29 NOTE — Patient Instructions (Signed)
Increase lipitor to 80 mg daily.   Focus on walking daily, weight loss and eating healthy.  Have primary care repeat lab work in 2-3 months.

## 2019-06-03 ENCOUNTER — Other Ambulatory Visit: Payer: Self-pay | Admitting: Family

## 2019-06-14 DIAGNOSIS — G4733 Obstructive sleep apnea (adult) (pediatric): Secondary | ICD-10-CM | POA: Diagnosis not present

## 2019-06-25 ENCOUNTER — Encounter: Payer: Self-pay | Admitting: Family

## 2019-06-25 NOTE — Progress Notes (Signed)
Outside notes received. Information abstracted. Notes sent to scan.  

## 2019-07-07 ENCOUNTER — Other Ambulatory Visit: Payer: Self-pay | Admitting: Family

## 2019-07-15 DIAGNOSIS — G4733 Obstructive sleep apnea (adult) (pediatric): Secondary | ICD-10-CM | POA: Diagnosis not present

## 2019-07-18 DIAGNOSIS — M25562 Pain in left knee: Secondary | ICD-10-CM | POA: Diagnosis not present

## 2019-07-22 ENCOUNTER — Ambulatory Visit (INDEPENDENT_AMBULATORY_CARE_PROVIDER_SITE_OTHER): Payer: Medicare Other | Admitting: Family

## 2019-07-22 ENCOUNTER — Encounter: Payer: Self-pay | Admitting: Family

## 2019-07-22 ENCOUNTER — Other Ambulatory Visit: Payer: Self-pay

## 2019-07-22 VITALS — BP 130/70 | HR 88 | Temp 98.2°F | Ht 65.5 in | Wt 261.0 lb

## 2019-07-22 DIAGNOSIS — F5104 Psychophysiologic insomnia: Secondary | ICD-10-CM

## 2019-07-22 DIAGNOSIS — R7303 Prediabetes: Secondary | ICD-10-CM

## 2019-07-22 DIAGNOSIS — M545 Low back pain, unspecified: Secondary | ICD-10-CM

## 2019-07-22 DIAGNOSIS — G8929 Other chronic pain: Secondary | ICD-10-CM

## 2019-07-22 DIAGNOSIS — F411 Generalized anxiety disorder: Secondary | ICD-10-CM | POA: Diagnosis not present

## 2019-07-22 DIAGNOSIS — I10 Essential (primary) hypertension: Secondary | ICD-10-CM

## 2019-07-22 DIAGNOSIS — E785 Hyperlipidemia, unspecified: Secondary | ICD-10-CM | POA: Diagnosis not present

## 2019-07-22 LAB — HEMOGLOBIN A1C: Hgb A1c MFr Bld: 6.2 % (ref 4.6–6.5)

## 2019-07-22 LAB — CBC WITH DIFFERENTIAL/PLATELET
Basophils Absolute: 0.1 10*3/uL (ref 0.0–0.1)
Basophils Relative: 1.1 % (ref 0.0–3.0)
Eosinophils Absolute: 0.2 10*3/uL (ref 0.0–0.7)
Eosinophils Relative: 3.7 % (ref 0.0–5.0)
HCT: 36.9 % — ABNORMAL LOW (ref 39.0–52.0)
Hemoglobin: 12.3 g/dL — ABNORMAL LOW (ref 13.0–17.0)
Lymphocytes Relative: 26.1 % (ref 12.0–46.0)
Lymphs Abs: 1.6 10*3/uL (ref 0.7–4.0)
MCHC: 33.3 g/dL (ref 30.0–36.0)
MCV: 86.2 fl (ref 78.0–100.0)
Monocytes Absolute: 0.6 10*3/uL (ref 0.1–1.0)
Monocytes Relative: 10.4 % (ref 3.0–12.0)
Neutro Abs: 3.6 10*3/uL (ref 1.4–7.7)
Neutrophils Relative %: 58.7 % (ref 43.0–77.0)
Platelets: 287 10*3/uL (ref 150.0–400.0)
RBC: 4.28 Mil/uL (ref 4.22–5.81)
RDW: 13.9 % (ref 11.5–15.5)
WBC: 6.2 10*3/uL (ref 4.0–10.5)

## 2019-07-22 LAB — LIPID PANEL
Cholesterol: 167 mg/dL (ref 0–200)
HDL: 41.2 mg/dL (ref >39.00)
NonHDL: 126.21
Total CHOL/HDL Ratio: 4
Triglycerides: 231 mg/dL — ABNORMAL HIGH (ref 0.0–149.0)
VLDL: 46.2 mg/dL — ABNORMAL HIGH (ref 0.0–40.0)

## 2019-07-22 LAB — COMPREHENSIVE METABOLIC PANEL
ALT: 12 U/L (ref 0–53)
AST: 15 U/L (ref 0–37)
Albumin: 4.3 g/dL (ref 3.5–5.2)
Alkaline Phosphatase: 68 U/L (ref 39–117)
BUN: 22 mg/dL (ref 6–23)
CO2: 26 mEq/L (ref 19–32)
Calcium: 9.4 mg/dL (ref 8.4–10.5)
Chloride: 103 mEq/L (ref 96–112)
Creatinine, Ser: 1.05 mg/dL (ref 0.40–1.50)
GFR: 69.11 mL/min (ref >60.00)
Glucose, Bld: 112 mg/dL — ABNORMAL HIGH (ref 70–99)
Potassium: 4 mEq/L (ref 3.5–5.1)
Sodium: 137 mEq/L (ref 135–145)
Total Bilirubin: 0.3 mg/dL (ref 0.2–1.2)
Total Protein: 7.2 g/dL (ref 6.0–8.3)

## 2019-07-22 LAB — LDL CHOLESTEROL, DIRECT: Direct LDL: 105 mg/dL

## 2019-07-22 MED ORDER — TRAZODONE HCL 50 MG PO TABS: 25.0000 mg | ORAL_TABLET | Freq: Every evening | ORAL | 1 refills | Status: DC | PRN

## 2019-07-22 NOTE — Progress Notes (Signed)
Aaron [name] is a 74 y.o. male with the following history as recorded in EpicCare:  Patient Active Problem List   Diagnosis Date Noted  . History of DVT (deep vein thrombosis) 05/06/2019  . History of pulmonary embolus (PE) 05/06/2019  . Pre-diabetes 05/18/2018  . Chronic low back pain 11/26/2017  . Status post total knee replacement 08/15/2016  . Morbid obesity ([place]) 08/01/2016  . Primary osteoarthritis of right knee 04/04/2016  . DVT (deep venous thrombosis) ([place]) 11/09/2015  . Primary osteoarthritis of left knee 10/09/2015  . Generalized anxiety disorder 08/20/2015  . Routine general medical examination at a health care facility 02/11/2015  . Medicare annual wellness visit, subsequent 02/11/2015  . Long term current use of anticoagulant 06/14/2011  . Coronary artery disease   . Hypertension   . Hyperlipidemia   . Depression   . GERD (gastroesophageal reflux disease)   . Insomnia   . Sleep apnea, obstructive   . Pulmonary embolism ([place])     Current Outpatient Medications  Medication Sig Dispense Refill  . ALPRAZolam (XANAX) 1 MG tablet TAKE 1 TABLET (1 MG TOTAL) BY MOUTH AT BEDTIME AS NEEDED FOR ANXIETY OR SLEEP. 30 tablet 1  . amLODipine (NORVASC) 5 MG tablet Take 1 tablet (5 mg total) by mouth daily. At bedtime 90 tablet 3  . atorvastatin (LIPITOR) 80 MG tablet Take 1 tablet (80 mg total) by mouth daily. 90 tablet 3  . fenofibrate 160 MG tablet TAKE 1 TABLET BY MOUTH  DAILY 90 tablet 3  . Melatonin 10 MG TABS Take by mouth.    Marland Kitchen omeprazole (PRILOSEC) 20 MG capsule Take 1 capsule (20 mg total) by mouth daily. 90 capsule 1  . telmisartan-hydrochlorothiazide (MICARDIS HCT) 80-25 MG tablet TAKE 1 TABLET BY MOUTH EVERY DAY 90 tablet 1  . traMADol (ULTRAM) 50 MG tablet TAKE 1-2 TABLETS (50-100 MG TOTAL) BY MOUTH EVERY 8 (EIGHT) HOURS AS NEEDED. FOR PAIN 180 tablet 1  . venlafaxine XR (EFFEXOR-XR) 75 MG 24 hr capsule TAKE 1 CAPSULE BY MOUTH  DAILY 90 capsule 3  . XARELTO 20 MG  TABS tablet TAKE 1 TABLET BY MOUTH  DAILY WITH SUPPER 90 tablet 3  . Melatonin 5 MG TABS Take 1 tablet by mouth at bedtime as needed.    . traZODone (DESYREL) 50 MG tablet Take 0.5-1 tablets (25-50 mg total) by mouth at bedtime as needed for sleep. 30 tablet 1   No current facility-administered medications for this visit.    Allergies: Wellbutrin [bupropion] and Amoxicillin  Past Medical History:  Diagnosis Date  . Anemia    "@ birth"  . Anxiety   . Blood transfusion    "@ birth"  . Cancer ([place])    Head - skin cancer  . Constipation due to pain medication   . Coronary artery disease    DES LAD 2005  . Depression   . Full dentures   . GERD (gastroesophageal reflux disease)   . 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 Heart murmur   . Hyperlipidemia   . Hypertension   . Insomnia   . Osteoarthritis   . PONV (postoperative nausea and vomiting)    2006 - knee surgery  . Pre-diabetes   . Psoriasis   . PTSD (post-traumatic stress disorder)   . Pulmonary embolism ([place]) ~ 03/2006   bilaterally  . Seasonal allergies   . Shortness of breath 06/08/11   "w/exertion; that's why I'm here"  .  Sleep apnea, obstructive   . Umbilical hernia     Past Surgical History:  Procedure Laterality Date  . CARDIAC CATHETERIZATION    . carpel tunnel release Bilateral ~ 06/2005/~ 08/2005   right/left  . COLONOSCOPY W/ BIOPSIES AND POLYPECTOMY    . CORONARY ANGIOPLASTY    . CORONARY STENT PLACEMENT  02/13/2004   1  . ENUCLEATION Right    age 5  . EYE SURGERY     age 15; "ruptured  right pupil"  . EYE SURGERY     age 43  . EYE SURGERY  1957   placed artificial right eye  . INGUINAL HERNIA REPAIR Right 1983  . INGUINAL HERNIA REPAIR Left 1985  . KNEE ARTHROSCOPY  ~ 06/2005   right  . MULTIPLE TOOTH EXTRACTIONS    . spinal injections  2006-2007   "maybe 10 injections for herniated discs"  . TONSILLECTOMY     "when I was real young"  . TOTAL KNEE ARTHROPLASTY  Left 10/09/2015   Procedure: TOTAL KNEE ARTHROPLASTY;  Surgeon: Dorna Leitz, MD;  Location: [facility];  Service: Orthopedics;  Laterality: Left;  . TOTAL KNEE ARTHROPLASTY Right 04/04/2016   Procedure: TOTAL KNEE ARTHROPLASTY;  Surgeon: Dorna Leitz, MD;  Location: [facility];  Service: Orthopedics;  Laterality: Right;    Family History  Problem Relation Age of Onset  . Asthma Father   . Emphysema Father   . Breast cancer Sister   . Liver cancer Sister   . Asthma Sister   . Asthma Brother     Social History   Tobacco Use  . Smoking status: Former Smoker    Packs/day: 1.50    Years: 25.00    Pack years: 37.50    Types: Cigarettes    Quit date: 07/10/1995    Years since quitting: 24.0  . Smokeless tobacco: Never Used  Substance Use Topics  . Alcohol use: No    Subjective:  Follow-up on chronic care needs- 1) At last OV with his cardiologist, patient's Atorvastatin to 80 mg; requesting updated labs; 2) Needs 6 month labs to re-check Hgba1c- history of pre-diabetes; 3) Notes that has recently gotten a new CPAP machine- feeling much better;  4) On lifetime anticoagulation- takes Xarelto 20 mg daily;  5) Anxiety and depression- good response to increased dosage of Effexor;  6) Hypertension- stable on combination on Amlodipine and Micardis HCT; Denies any chest pain, shortness of breath, blurred vision or headache 7) Has appointment with his pulmonologist in early February to follow-up on insomnia; mentions that there was a discussion about having him try Trazodone if Melatonin was not effective; wonders if he could go ahead and try the medication before the upcoming pulmonology appointment    Objective:  Vitals:   07/22/19 1417  BP: 130/70  Pulse: 88  Temp: 98.2 F (36.8 C)  TempSrc: Oral  SpO2: 97%  Weight: 261 lb (118.4 kg)  Height: 5' 5.5" (1.664 m)    General: Well developed, well nourished, in no acute distress  Skin : Warm and dry.  Head: Normocephalic and atraumatic  Eyes:  Sclera and conjunctiva clear; pupils round and reactive to light; extraocular movements intact  Ears: External normal; canals clear; tympanic membranes normal  Oropharynx: Pink, supple. No suspicious lesions  Neck: Supple without thyromegaly, adenopathy  Lungs: Respirations unlabored; clear to auscultation bilaterally without wheeze, rales, rhonchi  CVS exam: normal rate and regular rhythm.  Neurologic: Alert and oriented; speech intact; face symmetrical; moves all extremities well; CNII-XII  intact without focal deficit   Assessment:  1. Hyperlipidemia, unspecified hyperlipidemia type   2. Pre-diabetes   3. Essential hypertension   4. Generalized anxiety disorder   5. Chronic low back pain, unspecified back pain laterality, unspecified whether sciatica present   6. Chronic insomnia     Plan:  1. Update labs- now taking Lipitor 80 mg daily; 2. Check Hgba1c today; 3. Stable; continue same medications;  4. Continue Effexor XR;  5. Continue with orthopedist for injections as scheduled; 6. Rx for Trazodone 50 mg- use as directed; he has a scheduled follow-up with his pulmonologist in February and can review response at that time. Patient understands not to mix with his Tramadol;   This visit occurred during the SARS-CoV-2 public health emergency.  Safety protocols were in place, including screening questions prior to the visit, additional usage of staff PPE, and extensive cleaning of exam room while observing appropriate contact time as indicated for disinfecting solutions.     No follow-ups on file.  Orders Placed This Encounter  Procedures  . Lipid panel  . Comp Met (CMET)  . CBC w/Diff  . HgB A1c    Requested Prescriptions   Signed Prescriptions Disp Refills  . traZODone (DESYREL) 50 MG tablet 30 tablet 1    Sig: Take 0.5-1 tablets (25-50 mg total) by mouth at bedtime as needed for sleep.

## 2019-08-04 ENCOUNTER — Other Ambulatory Visit: Payer: Self-pay | Admitting: Family

## 2019-08-15 ENCOUNTER — Ambulatory Visit: Payer: Medicare Other | Admitting: Pulmonary Disease

## 2019-08-15 DIAGNOSIS — G4733 Obstructive sleep apnea (adult) (pediatric): Secondary | ICD-10-CM | POA: Diagnosis not present

## 2019-08-24 ENCOUNTER — Ambulatory Visit: Payer: Medicare Other | Attending: Internal Medicine

## 2019-08-24 DIAGNOSIS — Z23 Encounter for immunization: Secondary | ICD-10-CM | POA: Insufficient documentation

## 2019-08-24 NOTE — Progress Notes (Signed)
   Covid-19 Vaccination Clinic  Name:  Aaron [name]    MRN: [id number] DOB: 18-Dec-1945  08/24/2019  [name] was observed post Covid-19 immunization. He began feeling hot at 1430. He developed a headache and stated he "felt in a fog." Last ate around 1000. He was given water, crackers, and a fan. BP=157/70 and HR=74. Denied any difficulty breathing, chest pain, itchiness, or difficulty swallowing. Patient alert and oriented x3. Patient conversing throughout the encounter. Patient stated "I felt like I was going to pass out after walking from the car to get in the building. Patient was monitored until 1521. At that time, patient stated he felt like normal. Denied feeling hot or foggy. Patient escorted out of the building with staff.     He was provided with Vaccine Information Sheet and instruction to access the V-Safe system.   [name] was instructed to call 911 with any severe reactions post vaccine: Marland Kitchen Difficulty breathing  . Swelling of your face and throat  . A fast heartbeat  . A bad rash all over your body  . Dizziness and weakness    Immunizations Administered    Name Date Dose VIS Date Route   Pfizer COVID-19 Vaccine 08/24/2019  2:18 PM 0.3 mL 06/14/2019 Intramuscular   Manufacturer: [place]   Lot: [id number]   [place]: [id number]

## 2019-09-03 ENCOUNTER — Other Ambulatory Visit: Payer: Self-pay | Admitting: Family

## 2019-09-10 ENCOUNTER — Other Ambulatory Visit: Payer: Self-pay | Admitting: Family

## 2019-09-11 ENCOUNTER — Ambulatory Visit: Payer: Medicare Other | Admitting: Pulmonary Disease

## 2019-09-12 DIAGNOSIS — G4733 Obstructive sleep apnea (adult) (pediatric): Secondary | ICD-10-CM | POA: Diagnosis not present

## 2019-09-17 ENCOUNTER — Ambulatory Visit: Payer: Medicare Other | Attending: Internal Medicine

## 2019-09-17 DIAGNOSIS — G4733 Obstructive sleep apnea (adult) (pediatric): Secondary | ICD-10-CM | POA: Diagnosis not present

## 2019-09-17 DIAGNOSIS — Z23 Encounter for immunization: Secondary | ICD-10-CM

## 2019-09-17 NOTE — Progress Notes (Signed)
   Covid-19 Vaccination Clinic  Name:  Aaron [name]    MRN: [id number] DOB: 08/14/1945  09/17/2019  [name] was observed post Covid-19 immunization for 30 minutes based on pre-vaccination screening without incident. He was provided with Vaccine Information Sheet and instruction to access the V-Safe system.   [name] was instructed to call 911 with any severe reactions post vaccine: Marland Kitchen Difficulty breathing  . Swelling of face and throat  . A fast heartbeat  . A bad rash all over body  . Dizziness and weakness   Immunizations Administered    Name Date Dose VIS Date Route   Pfizer COVID-19 Vaccine 09/17/2019  1:12 PM 0.3 mL 06/14/2019 Intramuscular   Manufacturer: [place]   Lot: [id number]   [place]: [id number]

## 2019-09-24 ENCOUNTER — Ambulatory Visit: Payer: Medicare Other | Admitting: Pulmonary Disease

## 2019-09-24 ENCOUNTER — Encounter: Payer: Self-pay | Admitting: Pulmonary Disease

## 2019-09-24 ENCOUNTER — Other Ambulatory Visit: Payer: Self-pay

## 2019-09-24 VITALS — BP 118/72 | HR 71 | Temp 98.4°F | Ht 65.5 in | Wt 260.0 lb

## 2019-09-24 DIAGNOSIS — Z86718 Personal history of other venous thrombosis and embolism: Secondary | ICD-10-CM | POA: Diagnosis not present

## 2019-09-24 DIAGNOSIS — Z86711 Personal history of pulmonary embolism: Secondary | ICD-10-CM | POA: Diagnosis not present

## 2019-09-24 DIAGNOSIS — F411 Generalized anxiety disorder: Secondary | ICD-10-CM | POA: Diagnosis not present

## 2019-09-24 DIAGNOSIS — G4733 Obstructive sleep apnea (adult) (pediatric): Secondary | ICD-10-CM

## 2019-09-24 NOTE — Assessment & Plan Note (Signed)
Plan: Continue Xarelto 

## 2019-09-24 NOTE — Assessment & Plan Note (Signed)
Plan: Continue CPAP therapy Continue anxiety management with primary care Follow-up with our office in 9 months

## 2019-09-24 NOTE — Assessment & Plan Note (Signed)
Patient reporting improvement today  Plan: Keep follow-up with primary care Continue medications as outlined by primary care

## 2019-09-24 NOTE — Patient Instructions (Addendum)
You were seen today by Lauraine Rinne, NP  for:   It was great seeing you today.  Keep up your hard work.  Your CPAP compliance report looks excellent.  We will see you back in office in about 6 to 9 months.  Take [name] and stay safe,  A[facility] [name]  1. Sleep apnea, obstructive  We recommend that you continue using your CPAP daily >>>Keep up the hard work using your device >>> Goal should be wearing this for the entire night that you are sleeping, at least 4 to 6 hours  Remember:  . Do not drive or operate heavy machinery if tired or drowsy.  . Please notify the supply company and office if you are unable to use your device regularly due to missing supplies or machine being broken.  . Work on maintaining a healthy weight and following your recommended nutrition plan  . Maintain proper daily exercise and movement  . Maintaining proper use of your device can also help improve management of other chronic illnesses such as: Blood pressure, blood sugars, and weight management.   BiPAP/ CPAP Cleaning:  >>>Clean weekly, with Dawn soap, and bottle brush.  Set up to air dry. >>> Wipe mask out daily with wet wipe or towelette   Work on improving sleep hygiene:   Sleep habits   Keep a sleep diary to help you and your health [name] provider figure out what could be causing your insomnia. Write down: ? When you sleep. ? When you wake up during the night. ? How well you sleep. ? How rested you feel the next day. ? Any side effects of medicines you are taking. ? What you eat and drink.  Make your bedroom a dark, comfortable place where it is easy to fall asleep. ? Put up shades or blackout curtains to block light from outside. ? Use a white noise machine to block noise. ? Keep the temperature cool.  Limit screen use before bedtime. This includes: ? Watching TV. ? Using your smartphone, tablet, or computer.  Stick to a routine that includes going to bed and waking up at the same times every day  and night. This can help you fall asleep faster. Consider making a quiet activity, such as reading, part of your nighttime routine.  Try to avoid taking naps during the day so that you sleep better at night.  Get out of bed if you are still awake after 15 minutes of trying to sleep. Keep the lights down, but try reading or doing a quiet activity. When you feel sleepy, go back to bed.   2. History of DVT (deep vein thrombosis) 3. History of pulmonary embolus (PE)  Continue Xarelto  4. Generalized anxiety disorder  Glad this is improved with trazodone  Keep follow-up with primary [name]   Follow Up:    Return in about 9 months (around 06/25/2020), or if symptoms worsen or fail to improve, for Follow up with Dr. Halford Chessman.   Please do your part to reduce the spread of COVID-19:      Reduce your risk of any infection  and COVID19 by using the similar precautions used for avoiding the common cold or flu:  Marland Kitchen Wash your hands often with soap and warm water for at least 20 seconds.  If soap and water are not readily available, use an alcohol-based hand sanitizer with at least 60% alcohol.  . If coughing or sneezing, cover your mouth and nose by coughing or sneezing into the  elbow areas of your shirt or coat, into a tissue or into your sleeve (not your hands). [facility] [name] A MASK when in public  . Avoid shaking hands with others and consider head nods or verbal greetings only. . Avoid touching your eyes, nose, or mouth with unwashed hands.  . Avoid close contact with people who are sick. . Avoid places or events with large numbers of people in one location, like concerts or sporting events. . If you have some symptoms but not all symptoms, continue to monitor at home and seek medical attention if your symptoms worsen. . If you are having a medical emergency, call 911.   [place] / e-Visit:  [email or web address]         [name]: [name]: [id number]                   [name]: [id number]     It is flu season:   >>> Best ways to protect herself from the flu: Receive the yearly flu vaccine, practice good hand hygiene washing with soap and also using hand sanitizer when available, eat a nutritious meals, get adequate rest, hydrate appropriately   Please contact the office if your symptoms worsen or you have concerns that you are not improving.   Thank you for choosing [name] for your healthcare, and for allowing [place] to partner with you on your healthcare journey. I am thankful to be able to provide [name] to you today.   Wyn Quaker FNP-C

## 2019-09-24 NOTE — Addendum Note (Signed)
Addended by: Valerie Salts on: 09/24/2019 03:24 PM   Modules accepted: Orders

## 2019-09-24 NOTE — Progress Notes (Signed)
@Patient  ID: Aaron [name], male    DOB: 11-Dec-1945, 74 y.o.   MRN: [id number]  Chief Complaint  Patient presents with  . Follow-up    4 month f/u for OSA. States he has been sleeping much better.     Referring provider: [name],*  HPI:  74 year old male former smoker followed in our office for severe obstructive sleep apnea  PMH: Hypertension, CAD, hyperlipidemia, depression, GERD, insomnia, history of PE, history of DVT, morbid obesity, prediabetes, anxiety Smoker/ Smoking History: Former smoker.  Quit 1997.  37.5-pack-year smoking history Maintenance: None Pt of: Dr. Halford Chessman  09/24/2019  - Visit   74 year old male former smoker followed in our office for obstructive sleep apnea.  Patient complaining of 58-month follow-up with our office.  Since last being seen he has been started on trazodone from his PCP.  He reports that his sleepiness significantly improved.  He also feels that his anxiety is better managed.  He also received a Covid shot just like his spouse.  Tolerated well.  See CPAP compliance for listed below:  08/23/2019-09/21/2018 21-30 out of last 30 days used, all 30 those days greater than 4 hours, average usage 9 hours and 16 minutes, AHI 0.5  Patient admits he does still struggle with poor sleep hygiene.  This is complicated by the fact that his spouse keeps a very irregular sleep cycle.  He reports that he is diligently trying to work on this.  Tests:   Pulmonary tests: 06/08/11 CT chest >> b/l PE, ATX, LUL 53mm nodule  12/11/11 CT chest >> LUL nodule resolved, but new b/l nodules up to 8 mm  06/20/12 CT chest >> no change  06/20/13 CT chest >> no change  Sleep tests: PSG 09/22/13 >> AHI 5, RDI 61.4, SaO2 low 90%  FENO:  No results found for: NITRICOXIDE  PFT: No flowsheet data found.  WALK:  No flowsheet data found.  Imaging: No results found.  Lab Results:  CBC    Component Value Date/Time   WBC 6.2 07/22/2019 1450   RBC 4.28  07/22/2019 1450   HGB 12.3 (L) 07/22/2019 1450   HGB 13.8 05/15/2007 1520   HCT 36.9 (L) 07/22/2019 1450   HCT 39.6 05/15/2007 1520   PLT 287.0 07/22/2019 1450   PLT 221 05/15/2007 1520   MCV 86.2 07/22/2019 1450   MCV 83.3 05/15/2007 1520   MCH 27.7 04/06/2016 0617   MCHC 33.3 07/22/2019 1450   RDW 13.9 07/22/2019 1450   RDW 14.5 05/15/2007 1520   LYMPHSABS 1.6 07/22/2019 1450   LYMPHSABS 1.2 05/15/2007 1520   MONOABS 0.6 07/22/2019 1450   MONOABS 0.6 05/15/2007 1520   EOSABS 0.2 07/22/2019 1450   EOSABS 0.1 05/15/2007 1520   BASOSABS 0.1 07/22/2019 1450   BASOSABS 0.0 05/15/2007 1520    BMET    Component Value Date/Time   NA 137 07/22/2019 1450   K 4.0 07/22/2019 1450   CL 103 07/22/2019 1450   CO2 26 07/22/2019 1450   GLUCOSE 112 (H) 07/22/2019 1450   BUN 22 07/22/2019 1450   CREATININE 1.05 07/22/2019 1450   CALCIUM 9.4 07/22/2019 1450   GFRNONAA >60 04/05/2016 0838   GFRAA >60 04/05/2016 0838    BNP No results found for: BNP  ProBNP    Component Value Date/Time   PROBNP 2074.0 (H) 06/08/2011 1144    Specialty Problems      Pulmonary Problems   Sleep apnea, obstructive    PSG 09/22/13 >> AHI  5, RDI 61.4, SaO2 low 90%         Allergies  Allergen Reactions  . Wellbutrin [Bupropion] Other (See Comments)    Hallucinations, sweating  . Amoxicillin Palpitations    Immunization History  Administered Date(s) Administered  . Influenza Split 05/05/2011, 04/03/2013  . Influenza Whole 04/30/2012  . Influenza, High Dose Seasonal PF 08/10/2015, 03/08/2016, 04/20/2017, 04/24/2018, 03/26/2019  . Influenza-Unspecified 03/26/2019  . PFIZER SARS-COV-2 Vaccination 08/24/2019, 09/17/2019  . Pneumococcal Conjugate-13 02/11/2015  . Pneumococcal Polysaccharide-23 06/09/2011  . Td 02/11/2015    Past Medical History:  Diagnosis Date  . Anemia    "@ birth"  . Anxiety   . Blood transfusion    "@ birth"  . Cancer ([facility])    Head - skin cancer  . Constipation  due to pain medication   . Coronary artery disease    DES LAD 2005  . Depression   . Full dentures   . GERD (gastroesophageal reflux disease)   . 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 Heart murmur   . Hyperlipidemia   . Hypertension   . Insomnia   . Osteoarthritis   . PONV (postoperative nausea and vomiting)    2006 - knee surgery  . Pre-diabetes   . Psoriasis   . PTSD (post-traumatic stress disorder)   . Pulmonary embolism ([place]) ~ 03/2006   bilaterally  . Seasonal allergies   . Shortness of breath 06/08/11   "w/exertion; that's why I'm here"  . Sleep apnea, obstructive   . Umbilical hernia     Tobacco History: Social History   Tobacco Use  Smoking Status Former Smoker  . Packs/day: 1.50  . Years: 25.00  . Pack years: 37.50  . Types: Cigarettes  . Quit date: 07/10/1995  . Years since quitting: 24.2  Smokeless Tobacco Never Used   Counseling given: Yes   Continue to not smoke  Outpatient Encounter Medications as of 09/24/2019  Medication Sig  . ALPRAZolam (XANAX) 1 MG tablet TAKE 1 TABLET (1 MG TOTAL) BY MOUTH AT BEDTIME AS NEEDED FOR ANXIETY OR SLEEP.  Marland Kitchen amLODipine (NORVASC) 5 MG tablet Take 1 tablet (5 mg total) by mouth daily. At bedtime  . atorvastatin (LIPITOR) 80 MG tablet Take 1 tablet (80 mg total) by mouth daily.  . fenofibrate 160 MG tablet TAKE 1 TABLET BY MOUTH  DAILY  . omeprazole (PRILOSEC) 20 MG capsule TAKE 1 CAPSULE BY MOUTH  DAILY  . telmisartan-hydrochlorothiazide (MICARDIS HCT) 80-25 MG tablet TAKE 1 TABLET BY MOUTH EVERY DAY  . traMADol (ULTRAM) 50 MG tablet TAKE 1-2 TABLETS (50-100 MG TOTAL) BY MOUTH EVERY 8 (EIGHT) HOURS AS NEEDED. FOR PAIN  . traZODone (DESYREL) 50 MG tablet TAKE 1/2-1 TABLET BY MOUTH AT BEDTIME AS NEEDED FOR SLEEP  . venlafaxine XR (EFFEXOR-XR) 75 MG 24 hr capsule TAKE 1 CAPSULE BY MOUTH  DAILY  . XARELTO 20 MG TABS tablet TAKE 1 TABLET BY MOUTH  DAILY WITH SUPPER  . [DISCONTINUED] Melatonin 10  MG TABS Take by mouth.  . [DISCONTINUED] Melatonin 5 MG TABS Take 1 tablet by mouth at bedtime as needed.   No facility-administered encounter medications on file as of 09/24/2019.     Review of Systems  Review of Systems  Constitutional: Negative for activity change, chills, fatigue, fever and unexpected weight change.  HENT: Negative for postnasal drip, rhinorrhea, sinus pressure, sinus pain and sore throat.   Eyes: Negative.   Respiratory: Negative for  cough, shortness of breath and wheezing.   Cardiovascular: Negative for chest pain and palpitations.  Gastrointestinal: Negative for constipation, diarrhea, nausea and vomiting.  Endocrine: Negative.   Genitourinary: Negative.   Musculoskeletal: Negative.   Skin: Negative.   Neurological: Negative for dizziness and headaches.  Psychiatric/Behavioral: Positive for sleep disturbance. Negative for dysphoric mood. The patient is hyperactive. The patient is not nervous/anxious.   All other systems reviewed and are negative.    Physical Exam  BP 118/72 (BP Location: Left Arm, Patient Position: Sitting, Cuff Size: Large)   Pulse 71   Temp 98.4 F (36.9 C) (Temporal)   Ht 5' 5.5" (1.664 m)   Wt 260 lb (117.9 kg)   SpO2 97% Comment: on RA  BMI 42.61 kg/m   Wt Readings from Last 5 Encounters:  09/24/19 260 lb (117.9 kg)  07/22/19 261 lb (118.4 kg)  05/29/19 254 lb 3.2 oz (115.3 kg)  05/06/19 260 lb 12.8 oz (118.3 kg)  01/16/19 254 lb 12.8 oz (115.6 kg)    BMI Readings from Last 5 Encounters:  09/24/19 42.61 kg/m  07/22/19 42.77 kg/m  05/29/19 41.66 kg/m  05/06/19 42.74 kg/m  01/16/19 41.76 kg/m     Physical Exam Vitals and nursing note reviewed.  Constitutional:      General: He is not in acute distress.    Appearance: Normal appearance. He is obese.  HENT:     Head: Normocephalic and atraumatic.     Right Ear: Hearing and external ear normal.     Left Ear: Hearing and external ear normal.     Nose: No  mucosal edema.     Right Turbinates: Not enlarged.     Left Turbinates: Not enlarged.  Cardiovascular:     Rate and Rhythm: Normal rate and regular rhythm.     Pulses: Normal pulses.     Heart sounds: Normal heart sounds. No murmur.  Pulmonary:     Effort: Pulmonary effort is normal.     Breath sounds: Normal breath sounds. No decreased breath sounds, wheezing or rales.  Musculoskeletal:     Cervical back: Normal range of motion.     Right lower leg: No edema.     Left lower leg: No edema.  Lymphadenopathy:     Cervical: No cervical adenopathy.  Skin:    General: Skin is warm and dry.     Capillary Refill: Capillary refill takes less than 2 seconds.     Findings: No erythema or rash.  Neurological:     General: No focal deficit present.     Mental Status: He is alert and oriented to person, place, and time.     Motor: No weakness.     Coordination: Coordination normal.     Gait: Gait is intact. Gait normal.  Psychiatric:        Mood and Affect: Mood normal.        Behavior: Behavior is hyperactive. Behavior is cooperative.        Thought Content: Thought content normal.        Judgment: Judgment normal.       Assessment & Plan:   Sleep apnea, obstructive Plan: Continue CPAP therapy Continue anxiety management with primary care Follow-up with our office in 9 months  Generalized anxiety disorder Patient reporting improvement today  Plan: Keep follow-up with primary care Continue medications as outlined by primary care  History of DVT (deep vein thrombosis) Plan: Continue Xarelto  History of pulmonary embolus (PE) Plan: Continue Xarelto  Return in about 9 months (around 06/25/2020), or if symptoms worsen or fail to improve, for Follow up with Dr. Halford Chessman.   Lauraine Rinne, NP 09/24/2019   This appointment required 22 minutes of patient care (this includes precharting, chart review, review of results, face-to-face care, etc.).

## 2019-10-04 ENCOUNTER — Other Ambulatory Visit: Payer: Self-pay | Admitting: Family

## 2019-10-13 DIAGNOSIS — G4733 Obstructive sleep apnea (adult) (pediatric): Secondary | ICD-10-CM | POA: Diagnosis not present

## 2019-11-12 DIAGNOSIS — G4733 Obstructive sleep apnea (adult) (pediatric): Secondary | ICD-10-CM | POA: Diagnosis not present

## 2019-12-13 ENCOUNTER — Other Ambulatory Visit: Payer: Self-pay | Admitting: Family

## 2019-12-13 DIAGNOSIS — G4733 Obstructive sleep apnea (adult) (pediatric): Secondary | ICD-10-CM | POA: Diagnosis not present

## 2019-12-18 ENCOUNTER — Other Ambulatory Visit: Payer: Self-pay | Admitting: Family

## 2019-12-26 DIAGNOSIS — M79662 Pain in left lower leg: Secondary | ICD-10-CM | POA: Diagnosis not present

## 2019-12-27 ENCOUNTER — Other Ambulatory Visit: Payer: Self-pay | Admitting: Family

## 2019-12-31 DIAGNOSIS — M25661 Stiffness of right knee, not elsewhere classified: Secondary | ICD-10-CM | POA: Diagnosis not present

## 2019-12-31 DIAGNOSIS — M25662 Stiffness of left knee, not elsewhere classified: Secondary | ICD-10-CM | POA: Diagnosis not present

## 2019-12-31 DIAGNOSIS — Z96653 Presence of artificial knee joint, bilateral: Secondary | ICD-10-CM | POA: Diagnosis not present

## 2020-01-09 DIAGNOSIS — G4733 Obstructive sleep apnea (adult) (pediatric): Secondary | ICD-10-CM | POA: Diagnosis not present

## 2020-01-12 DIAGNOSIS — G4733 Obstructive sleep apnea (adult) (pediatric): Secondary | ICD-10-CM | POA: Diagnosis not present

## 2020-01-14 DIAGNOSIS — M25661 Stiffness of right knee, not elsewhere classified: Secondary | ICD-10-CM | POA: Diagnosis not present

## 2020-01-14 DIAGNOSIS — Z96653 Presence of artificial knee joint, bilateral: Secondary | ICD-10-CM | POA: Diagnosis not present

## 2020-01-14 DIAGNOSIS — M25662 Stiffness of left knee, not elsewhere classified: Secondary | ICD-10-CM | POA: Diagnosis not present

## 2020-01-30 DIAGNOSIS — Z96651 Presence of right artificial knee joint: Secondary | ICD-10-CM | POA: Diagnosis not present

## 2020-01-30 DIAGNOSIS — M79662 Pain in left lower leg: Secondary | ICD-10-CM | POA: Diagnosis not present

## 2020-01-30 DIAGNOSIS — M79661 Pain in right lower leg: Secondary | ICD-10-CM | POA: Diagnosis not present

## 2020-01-30 DIAGNOSIS — Z96652 Presence of left artificial knee joint: Secondary | ICD-10-CM | POA: Diagnosis not present

## 2020-02-10 ENCOUNTER — Other Ambulatory Visit: Payer: Self-pay

## 2020-02-10 ENCOUNTER — Ambulatory Visit (INDEPENDENT_AMBULATORY_CARE_PROVIDER_SITE_OTHER): Payer: Medicare Other | Admitting: Family

## 2020-02-10 ENCOUNTER — Ambulatory Visit (INDEPENDENT_AMBULATORY_CARE_PROVIDER_SITE_OTHER): Payer: Medicare Other

## 2020-02-10 VITALS — BP 160/68 | HR 96 | Temp 98.3°F | Ht 65.5 in | Wt 263.0 lb

## 2020-02-10 DIAGNOSIS — G4730 Sleep apnea, unspecified: Secondary | ICD-10-CM

## 2020-02-10 DIAGNOSIS — I10 Essential (primary) hypertension: Secondary | ICD-10-CM

## 2020-02-10 DIAGNOSIS — R05 Cough: Secondary | ICD-10-CM

## 2020-02-10 DIAGNOSIS — R053 Chronic cough: Secondary | ICD-10-CM

## 2020-02-10 DIAGNOSIS — R7303 Prediabetes: Secondary | ICD-10-CM

## 2020-02-10 DIAGNOSIS — R1310 Dysphagia, unspecified: Secondary | ICD-10-CM

## 2020-02-10 MED ORDER — PANTOPRAZOLE SODIUM 40 MG PO TBEC: 40.0000 mg | DELAYED_RELEASE_TABLET | Freq: Every day | ORAL | 3 refills | Status: DC

## 2020-02-10 NOTE — Progress Notes (Signed)
Aaron [name] is a 74 y.o. male with the following history as recorded in EpicCare:  Patient Active Problem List   Diagnosis Date Noted  . History of DVT (deep vein thrombosis) 05/06/2019  . History of pulmonary embolus (PE) 05/06/2019  . Pre-diabetes 05/18/2018  . Chronic low back pain 11/26/2017  . Status post total knee replacement 08/15/2016  . Morbid obesity ([facility]) 08/01/2016  . Primary osteoarthritis of right knee 04/04/2016  . DVT (deep venous thrombosis) ([facility]) 11/09/2015  . Primary osteoarthritis of left knee 10/09/2015  . Generalized anxiety disorder 08/20/2015  . Routine general medical examination at a health care facility 02/11/2015  . Medicare annual wellness visit, subsequent 02/11/2015  . Long term current use of anticoagulant 06/14/2011  . Coronary artery disease   . Hypertension   . Hyperlipidemia   . Depression   . GERD (gastroesophageal reflux disease)   . Insomnia   . Sleep apnea, obstructive   . Pulmonary embolism ([facility])     Current Outpatient Medications  Medication Sig Dispense Refill  . ALPRAZolam (XANAX) 1 MG tablet TAKE 1 TABLET (1 MG TOTAL) BY MOUTH AT BEDTIME AS NEEDED FOR ANXIETY OR SLEEP. 30 tablet 1  . amLODipine (NORVASC) 5 MG tablet Take 1 tablet (5 mg total) by mouth daily. At bedtime 90 tablet 3  . atorvastatin (LIPITOR) 80 MG tablet Take 1 tablet (80 mg total) by mouth daily. 90 tablet 3  . fenofibrate 160 MG tablet TAKE 1 TABLET BY MOUTH  DAILY 90 tablet 3  . telmisartan-hydrochlorothiazide (MICARDIS HCT) 80-25 MG tablet TAKE 1 TABLET BY MOUTH EVERY DAY 90 tablet 1  . traMADol (ULTRAM) 50 MG tablet TAKE 1-2 TABLETS (50-100 MG TOTAL) BY MOUTH EVERY 8 (EIGHT) HOURS AS NEEDED. FOR PAIN 180 tablet 1  . traZODone (DESYREL) 50 MG tablet TAKE 1/2 TO 1 TABLET BY MOUTH AT BEDTIME AS NEEDED FOR SLEEP 90 tablet 1  . venlafaxine XR (EFFEXOR-XR) 75 MG 24 hr capsule TAKE 1 CAPSULE BY MOUTH  DAILY 90 capsule 3  . XARELTO 20 MG TABS tablet TAKE 1 TABLET BY  MOUTH  DAILY WITH SUPPER 90 tablet 3  . pantoprazole (PROTONIX) 40 MG tablet Take 1 tablet (40 mg total) by mouth daily. 90 tablet 3   No current facility-administered medications for this visit.    Allergies: Wellbutrin [bupropion] and Amoxicillin  Past Medical History:  Diagnosis Date  . Anemia    "@ birth"  . Anxiety   . Blood transfusion    "@ birth"  . Cancer ([facility])    Head - skin cancer  . Constipation due to pain medication   . Coronary artery disease    DES LAD 2005  . Depression   . Full dentures   . GERD (gastroesophageal reflux disease)   . 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 Heart murmur   . Hyperlipidemia   . Hypertension   . Insomnia   . Osteoarthritis   . PONV (postoperative nausea and vomiting)    2006 - knee surgery  . Pre-diabetes   . Psoriasis   . PTSD (post-traumatic stress disorder)   . Pulmonary embolism ([facility]) ~ 03/2006   bilaterally  . Seasonal allergies   . Shortness of breath 06/08/11   "w/exertion; that's why I'm here"  . Sleep apnea, obstructive   . Umbilical hernia     Past Surgical History:  Procedure Laterality Date  . CARDIAC CATHETERIZATION    .  carpel tunnel release Bilateral ~ 06/2005/~ 08/2005   right/left  . COLONOSCOPY W/ BIOPSIES AND POLYPECTOMY    . CORONARY ANGIOPLASTY    . CORONARY STENT PLACEMENT  02/13/2004   1  . ENUCLEATION Right    age 14  . EYE SURGERY     age 64; "ruptured  right pupil"  . EYE SURGERY     age 21  . EYE SURGERY  1957   placed artificial right eye  . INGUINAL HERNIA REPAIR Right 1983  . INGUINAL HERNIA REPAIR Left 1985  . KNEE ARTHROSCOPY  ~ 06/2005   right  . MULTIPLE TOOTH EXTRACTIONS    . spinal injections  2006-2007   "maybe 10 injections for herniated discs"  . TONSILLECTOMY     "when I was real young"  . TOTAL KNEE ARTHROPLASTY Left 10/09/2015   Procedure: TOTAL KNEE ARTHROPLASTY;  Surgeon: Dorna Leitz, MD;  Location: [place];  Service: Orthopedics;   Laterality: Left;  . TOTAL KNEE ARTHROPLASTY Right 04/04/2016   Procedure: TOTAL KNEE ARTHROPLASTY;  Surgeon: Dorna Leitz, MD;  Location: [place];  Service: Orthopedics;  Laterality: Right;    Family History  Problem Relation Age of Onset  . Asthma Father   . Emphysema Father   . Breast cancer Sister   . Liver cancer Sister   . Asthma Sister   . Asthma Brother     Social History   Tobacco Use  . Smoking status: Former Smoker    Packs/day: 1.50    Years: 25.00    Pack years: 37.50    Types: Cigarettes    Quit date: 07/10/1995    Years since quitting: 24.6  . Smokeless tobacco: Never Used  Substance Use Topics  . Alcohol use: No    Subjective:  Complaining of fatigue- does not feel like he is sleeping well/ difficulty staying asleep; does have sleep apnea/ wears CPAP- wife notes that patient has been snoring more recently; has not reached out to his pulmonologist with these concerns;  Also complaining of sensation that "lump" down in throat; does have GERD but has been having increased cough recently;     Objective:  Vitals:   02/10/20 1501  BP: (!) 160/68  Pulse: 96  Temp: 98.3 F (36.8 C)  TempSrc: Oral  SpO2: 98%  Weight: 263 lb (119.3 kg)  Height: 5' 5.5" (1.664 m)    General: Well developed, well nourished, in no acute distress  Skin : Warm and dry.  Head: Normocephalic and atraumatic  Lungs: Respirations unlabored; clear to auscultation bilaterally without wheeze, rales, rhonchi  CVS exam: normal rate and regular rhythm.  Neurologic: Alert and oriented; speech intact; face symmetrical; moves all extremities well; CNII-XII intact without focal deficit   Assessment:  1. Dysphagia, unspecified type   2. Pre-diabetes   3. Chronic cough   4. Essential hypertension   5. Sleep apnea, unspecified type     Plan:  1. ? Uncontrolled GERD; change to Protonix 40 mg daily; will update neck ultrasound as well; may need to refer to GI for endoscopy; 2. Check Hgba1c; 3.  Update CXR today; 4. ? Control; start checking regularly and follow-up with readings in 1 week; may need to adjust his medicaiton; 5. Needs to reach out to his pulmonologist to have machine/ settings evaluated;   This visit occurred during the SARS-CoV-2 public health emergency.  Safety protocols were in place, including screening questions prior to the visit, additional usage of staff PPE, and extensive cleaning of  exam room while observing appropriate contact time as indicated for disinfecting solutions.     No follow-ups on file.  Orders Placed This Encounter  Procedures  . US Soft Tissue Head/Neck (NON-THYROID)    Standing Status:   Future    Standing Expiration Date:   02/09/2021    Order Specific Question:   Reason for Exam (SYMPTOM  OR DIAGNOSIS REQUIRED)    Answer:   difficulty swallowing    Order Specific Question:   Preferred imaging location?    Answer:   [facility]  . DG Chest 2 View    Standing Status:   Future    Number of Occurrences:   1    Standing Expiration Date:   02/09/2021    Order Specific Question:   Reason for Exam (SYMPTOM  OR DIAGNOSIS REQUIRED)    Answer:   chronic cough    Order Specific Question:   Preferred imaging location?    Answer:   [facility]    Order Specific Question:   Radiology Contrast Protocol - do NOT remove file path    Answer:   [email or web address]  . CBC with Differential/Platelet    Standing Status:   Future    Number of Occurrences:   1    Standing Expiration Date:   02/09/2021  . Comp Met (CMET)    Standing Status:   Future    Number of Occurrences:   1    Standing Expiration Date:   02/09/2021  . Hemoglobin A1c    Standing Status:   Future    Number of Occurrences:   1    Standing Expiration Date:   02/09/2021    Requested Prescriptions   Signed Prescriptions Disp Refills  . pantoprazole (PROTONIX) 40 MG tablet 90 tablet 3    Sig: Take 1 tablet (40 mg total) by mouth  daily.

## 2020-02-10 NOTE — Patient Instructions (Signed)
Please start checking your blood pressure and check it daily for the next week; then let me hear back from you. Please call Dr. Halford Chessman about your CPAP machine;

## 2020-02-11 ENCOUNTER — Telehealth: Payer: Self-pay | Admitting: Pulmonary Disease

## 2020-02-11 ENCOUNTER — Telehealth: Payer: Self-pay

## 2020-02-11 DIAGNOSIS — R5383 Other fatigue: Secondary | ICD-10-CM

## 2020-02-11 LAB — CBC WITH DIFFERENTIAL/PLATELET
Absolute Monocytes: 791 cells/uL (ref 200–950)
Basophils Absolute: 87 cells/uL (ref 0–200)
Basophils Relative: 1.3 %
Eosinophils Absolute: 268 cells/uL (ref 15–500)
Eosinophils Relative: 4 %
HCT: 31.4 % — ABNORMAL LOW (ref 38.5–50.0)
Hemoglobin: 10.2 g/dL — ABNORMAL LOW (ref 13.2–17.1)
Lymphs Abs: 1742 cells/uL (ref 850–3900)
MCH: 27.1 pg (ref 27.0–33.0)
MCHC: 32.5 g/dL (ref 32.0–36.0)
MCV: 83.3 fL (ref 80.0–100.0)
MPV: 10.4 fL (ref 7.5–12.5)
Monocytes Relative: 11.8 %
Neutro Abs: 3812 cells/uL (ref 1500–7800)
Neutrophils Relative %: 56.9 %
Platelets: 320 10*3/uL (ref 140–400)
RBC: 3.77 10*6/uL — ABNORMAL LOW (ref 4.20–5.80)
RDW: 14.2 % (ref 11.0–15.0)
Total Lymphocyte: 26 %
WBC: 6.7 10*3/uL (ref 3.8–10.8)

## 2020-02-11 LAB — COMPREHENSIVE METABOLIC PANEL
AG Ratio: 1.7 (calc) (ref 1.0–2.5)
ALT: 11 U/L (ref 9–46)
AST: 14 U/L (ref 10–35)
Albumin: 4.1 g/dL (ref 3.6–5.1)
Alkaline phosphatase (APISO): 68 U/L (ref 35–144)
BUN: 20 mg/dL (ref 7–25)
CO2: 27 mmol/L (ref 20–32)
Calcium: 9.1 mg/dL (ref 8.6–10.3)
Chloride: 103 mmol/L (ref 98–110)
Creat: 1.16 mg/dL (ref 0.70–1.18)
Globulin: 2.4 g/dL (calc) (ref 1.9–3.7)
Glucose, Bld: 118 mg/dL — ABNORMAL HIGH (ref 65–99)
Potassium: 4.5 mmol/L (ref 3.5–5.3)
Sodium: 138 mmol/L (ref 135–146)
Total Bilirubin: 0.3 mg/dL (ref 0.2–1.2)
Total Protein: 6.5 g/dL (ref 6.1–8.1)

## 2020-02-11 LAB — HEMOGLOBIN A1C
Hgb A1c MFr Bld: 6.4 % of total Hgb — ABNORMAL HIGH (ref <5.7)
Mean Plasma Glucose: 137 (calc)
eAG (mmol/L): 7.6 (calc)

## 2020-02-11 NOTE — Telephone Encounter (Signed)
Spoke with the pt  He states has not been sleeping well the past few nights  He states wakes up about every 2 hours and his spouse has also noticed snoring  He reports he has a "lump in throat" that his PCP is evaluating with a scan later this wk  Wants Dr Halford Chessman to be aware of this and see if he can review DL and advise if any changes are needed with CPAP  I printed a DL and placed in Dr Juanetta Gosling lookat folder in C pod  Please advise thanks!

## 2020-02-11 NOTE — Telephone Encounter (Signed)
Labs placed for ferratin since unable to obtain from tube that quest drew.

## 2020-02-12 DIAGNOSIS — G4733 Obstructive sleep apnea (adult) (pediatric): Secondary | ICD-10-CM | POA: Diagnosis not present

## 2020-02-14 ENCOUNTER — Ambulatory Visit
Admission: RE | Admit: 2020-02-14 | Discharge: 2020-02-14 | Disposition: A | Discharge status: Home / Self Care | Payer: Medicare Other | Source: Ambulatory Visit | Attending: Family | Admitting: Family

## 2020-02-14 DIAGNOSIS — R1310 Dysphagia, unspecified: Secondary | ICD-10-CM | POA: Diagnosis not present

## 2020-02-14 DIAGNOSIS — E079 Disorder of thyroid, unspecified: Secondary | ICD-10-CM | POA: Diagnosis not present

## 2020-02-17 ENCOUNTER — Other Ambulatory Visit: Payer: Self-pay | Admitting: Family

## 2020-02-17 DIAGNOSIS — D649 Anemia, unspecified: Secondary | ICD-10-CM

## 2020-02-17 DIAGNOSIS — E049 Nontoxic goiter, unspecified: Secondary | ICD-10-CM

## 2020-02-17 NOTE — Telephone Encounter (Signed)
Attempted to call pt but unable to reach. Left message for him to return call. °

## 2020-02-17 NOTE — Telephone Encounter (Signed)
Auto CPAP 01/12/20 to 02/10/20 >> used on 30 of 30 nights with average 7 hrs 10 min.  Average AHI 0.9 with median CPAP 10 and 95 th percentile CPAP 14 cm H2O.  No significant mask leak.   Please let him know that his CPAP report shows very good control of sleep apnea with current settings.  If he is still having trouble with his sleep, then please schedule ROV with me or NP to further assess.

## 2020-02-18 NOTE — Telephone Encounter (Signed)
Called spoke with patient. He states his doctor told him to check on it. Since the report is good. He will just keep his appt he has for November.   Nothing further needed at this time.

## 2020-02-18 NOTE — Telephone Encounter (Signed)
Patient is returning phone call. Patient phone number is [phone].

## 2020-02-18 NOTE — Telephone Encounter (Signed)
lmtcb for pt.  

## 2020-02-27 ENCOUNTER — Other Ambulatory Visit: Payer: Self-pay | Admitting: Family

## 2020-03-02 ENCOUNTER — Other Ambulatory Visit: Payer: Self-pay | Admitting: Family

## 2020-03-02 DIAGNOSIS — I10 Essential (primary) hypertension: Secondary | ICD-10-CM

## 2020-03-18 DIAGNOSIS — Z970 Presence of artificial eye: Secondary | ICD-10-CM | POA: Diagnosis not present

## 2020-03-18 DIAGNOSIS — H40012 Open angle with borderline findings, low risk, left eye: Secondary | ICD-10-CM | POA: Diagnosis not present

## 2020-03-18 DIAGNOSIS — Z961 Presence of intraocular lens: Secondary | ICD-10-CM | POA: Diagnosis not present

## 2020-03-24 DIAGNOSIS — M47816 Spondylosis without myelopathy or radiculopathy, lumbar region: Secondary | ICD-10-CM | POA: Diagnosis not present

## 2020-04-10 DIAGNOSIS — G4733 Obstructive sleep apnea (adult) (pediatric): Secondary | ICD-10-CM | POA: Diagnosis not present

## 2020-04-21 DIAGNOSIS — M47816 Spondylosis without myelopathy or radiculopathy, lumbar region: Secondary | ICD-10-CM | POA: Diagnosis not present

## 2020-04-22 ENCOUNTER — Other Ambulatory Visit: Payer: Self-pay | Admitting: Family

## 2020-05-10 ENCOUNTER — Other Ambulatory Visit: Payer: Self-pay | Admitting: Family

## 2020-05-12 NOTE — Progress Notes (Signed)
Cardiology Office Note:    Date:  05/14/2020   ID:  Aaron [name], DOB 1946-02-03, MRN [id number]  PCP:  [name], FNP  Cardiologist:  Bianney Rockwood Martinique, MD   Referring MD: [name],*   Chief Complaint  Patient presents with  . Follow-up  . Shortness of Breath    History of Present Illness:    Aaron [name] is a 74 y.o. male with a hx of hypertension, coronary artery disease, recurrent PE/DVTs on xarelto, GERD, hyperlipidemia, and obesity.  He has a history of stenting to his mid LAD in 2005 following a nuclear stress test that showed evidence of anterior apical ischemia.  Follow-up Myoview in 2014 was normal.  He was anticoagulated on Xarelto.    On follow up today he reports he is having more dyspnea on exertion. He is limited s/p bilateral TKR but can't do much without getting SOB. No chest pain. On last lab work in August he was found to be anemic. Ferritin ordered but never done.   Past Medical History:  Diagnosis Date  . Anemia    "@ birth"  . Anxiety   . Blood transfusion    "@ birth"  . Cancer ([place])    Head - skin cancer  . Constipation due to pain medication   . Coronary artery disease    DES LAD 2005  . Depression   . Full dentures   . GERD (gastroesophageal reflux disease)   . 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 Heart murmur   . Hyperlipidemia   . Hypertension   . Insomnia   . Osteoarthritis   . PONV (postoperative nausea and vomiting)    2006 - knee surgery  . Pre-diabetes   . Psoriasis   . PTSD (post-traumatic stress disorder)   . Pulmonary embolism ([place]) ~ 03/2006   bilaterally  . Seasonal allergies   . Shortness of breath 06/08/11   "w/exertion; that's why I'm here"  . Sleep apnea, obstructive   . Umbilical hernia     Past Surgical History:  Procedure Laterality Date  . CARDIAC CATHETERIZATION    . carpel tunnel release Bilateral ~ 06/2005/~ 08/2005   right/left  . COLONOSCOPY W/  BIOPSIES AND POLYPECTOMY    . CORONARY ANGIOPLASTY    . CORONARY STENT PLACEMENT  02/13/2004   1  . ENUCLEATION Right    age 47  . EYE SURGERY     age 40; "ruptured  right pupil"  . EYE SURGERY     age 53  . EYE SURGERY  1957   placed artificial right eye  . INGUINAL HERNIA REPAIR Right 1983  . INGUINAL HERNIA REPAIR Left 1985  . KNEE ARTHROSCOPY  ~ 06/2005   right  . MULTIPLE TOOTH EXTRACTIONS    . spinal injections  2006-2007   "maybe 10 injections for herniated discs"  . TONSILLECTOMY     "when I was real young"  . TOTAL KNEE ARTHROPLASTY Left 10/09/2015   Procedure: TOTAL KNEE ARTHROPLASTY;  Surgeon: Dorna Leitz, MD;  Location: [facility];  Service: Orthopedics;  Laterality: Left;  . TOTAL KNEE ARTHROPLASTY Right 04/04/2016   Procedure: TOTAL KNEE ARTHROPLASTY;  Surgeon: Dorna Leitz, MD;  Location: [facility];  Service: Orthopedics;  Laterality: Right;    Current Medications: Current Meds  Medication Sig  . ALPRAZolam (XANAX) 1 MG tablet TAKE 1 TABLET (1 MG TOTAL) BY MOUTH AT BEDTIME AS NEEDED FOR ANXIETY OR  SLEEP.  . amLODipine (NORVASC) 5 MG tablet TAKE 1 TABLET (5 MG TOTAL) BY MOUTH DAILY. AT BEDTIME  . atorvastatin (LIPITOR) 80 MG tablet Take 1 tablet (80 mg total) by mouth daily.  . fenofibrate 160 MG tablet TAKE 1 TABLET BY MOUTH  DAILY  . pantoprazole (PROTONIX) 40 MG tablet Take 1 tablet (40 mg total) by mouth daily.  Marland Kitchen telmisartan-hydrochlorothiazide (MICARDIS HCT) 80-25 MG tablet TAKE 1 TABLET BY MOUTH EVERY DAY  . traMADol (ULTRAM) 50 MG tablet TAKE 1-2 TABLETS (50-100 MG TOTAL) BY MOUTH EVERY 8 (EIGHT) HOURS AS NEEDED. FOR PAIN  . traZODone (DESYREL) 50 MG tablet TAKE 1/2 TO 1 TABLET BY MOUTH AT BEDTIME AS NEEDED FOR SLEEP  . venlafaxine XR (EFFEXOR-XR) 75 MG 24 hr capsule TAKE 1 CAPSULE BY MOUTH  DAILY  . XARELTO 20 MG TABS tablet TAKE 1 TABLET BY MOUTH  DAILY WITH SUPPER     Allergies:   Wellbutrin [bupropion] and Amoxicillin   Social History   Socioeconomic History   . Marital status: Married    Spouse name: Not on file  . Number of children: 3  . Years of education: Not on file  . Highest education level: Not on file  Occupational History  . Occupation: building maintenance    Comment: disabled  Tobacco Use  . Smoking status: Former Smoker    Packs/day: 1.50    Years: 25.00    Pack years: 37.50    Types: Cigarettes    Quit date: 07/10/1995    Years since quitting: 24.8  . Smokeless tobacco: Never Used  Vaping Use  . Vaping Use: Never used  Substance and Sexual Activity  . Alcohol use: No  . Drug use: No  . Sexual activity: Not on file  Other Topics Concern  . 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   Recent stress, currently undergoing financial difficulties.   Social Determinants of Health   Financial Resource Strain:   . Difficulty of Paying Living Expenses: Not on file  Food Insecurity:   . Worried About Charity fundraiser in the Last Year: Not on file  . Ran Out of Food in the Last Year: Not on file  Transportation Needs:   . Lack of Transportation (Medical): Not on file  . Lack of Transportation (Non-Medical): Not on file  Physical Activity:   . Days of Exercise per Week: Not on file  . Minutes of Exercise per Session: Not on file  Stress:   . Feeling of Stress : Not on file  Social Connections:   . Frequency of Communication with Friends and Family: Not on file  . Frequency of Social Gatherings with Friends and Family: Not on file  . Attends Religious Services: Not on file  . Active Member of Clubs or Organizations: Not on file  . Attends Archivist Meetings: Not on file  . Marital Status: Not on file     Family History: The patient's family history includes Asthma in his brother, father, and sister; Breast cancer in his sister; Emphysema in his father; Liver cancer in his sister.  ROS:   Please see the history of present illness.     All  other systems reviewed and are negative.  EKGs/Labs/Other Studies Reviewed:    The following studies were reviewed today:  none  EKG:  EKG is ordered today.  The ekg ordered today demonstrates sinus rhythm rate 86.  Nonspecific ST abnormality. I have personally reviewed and interpreted this study.   Recent Labs: 02/10/2020: ALT 11; BUN 20; Creat 1.16; Hemoglobin 10.2; Platelets 320; Potassium 4.5; Sodium 138  Recent Lipid Panel    Component Value Date/Time   CHOL 167 07/22/2019 1450   TRIG 231.0 (H) 07/22/2019 1450   HDL 41.20 07/22/2019 1450   CHOLHDL 4 07/22/2019 1450   VLDL 46.2 (H) 07/22/2019 1450   LDLCALC 117 (H) 01/16/2019 1234   LDLDIRECT 105.0 07/22/2019 1450    Physical Exam:    VS:  BP (!) 148/70 (BP Location: Left Arm, Patient Position: Sitting, Cuff Size: Large)   Pulse 86   Ht 5' 5.5" (1.664 m)   Wt 263 lb (119.3 kg)   BMI 43.10 kg/m     Wt Readings from Last 3 Encounters:  05/14/20 263 lb (119.3 kg)  02/10/20 263 lb (119.3 kg)  09/24/19 260 lb (117.9 kg)     GEN: Well nourished, obese,  in no acute distress HEENT: Normal NECK: No JVD; No carotid bruits CARDIAC: RRR, no murmurs, rubs, gallops RESPIRATORY:  Clear to auscultation without rales, wheezing or rhonchi  ABDOMEN: Soft, non-tender, non-distended MUSCULOSKELETAL:  No edema; No deformity  SKIN: Warm and dry NEUROLOGIC:  Alert and oriented x 3 PSYCHIATRIC:  Normal affect   ASSESSMENT:    1. Coronary artery disease involving native coronary artery of native heart without angina pectoris   2. Essential hypertension   3. Hypercholesteremia   4. Dyspnea on exertion    PLAN:    In order of problems listed above:  1. Coronary artery disease involving native coronary artery of native heart  Remote stenting of the LAD in 2005. Not on ASA due to need for anticoagulation. Continue statin.  He is having DOE which may be an anginal equivalent. Recommend a Lexiscan myoview study. Last evaluation in  2014.   2. Essential hypertension Fairly well controlled on current regimen.  3. Other pulmonary embolism without acute cor pulmonale, unspecified chronicity (HCC) Deep vein thrombosis (DVT) of proximal lower extremity, unspecified chronicity, unspecified laterality ([place]) Long term current use of anticoagulant He is compliant on Xarelto.   4. Hypercholesterolemia. Goal LDL < 70. On his last visit we increased his lipitor to 80 mg daily. LDL is 105 which is still above goal < 70. Will have him see our Pharm D at the beginning of the year to see if he is a candidate for a PCSK 9 inhibitor.   5. Anemia. Will repeat CBC and check ferritin today. He is overdue for colonoscopy.    Follow-up in 6 months   Medication Adjustments/Labs and Tests Ordered: Current medicines are reviewed at length with the patient today.  Concerns regarding medicines are outlined above.  No orders of the defined types were placed in this encounter.  No orders of the defined types were placed in this encounter.   Signed, Ailene Royal Martinique, MD  05/14/2020 2:11 PM    [place]

## 2020-05-14 ENCOUNTER — Other Ambulatory Visit: Payer: Self-pay

## 2020-05-14 ENCOUNTER — Encounter: Payer: Self-pay | Admitting: Cardiology

## 2020-05-14 ENCOUNTER — Ambulatory Visit: Payer: Medicare Other | Admitting: Cardiology

## 2020-05-14 VITALS — BP 148/70 | HR 86 | Ht 65.5 in | Wt 263.0 lb

## 2020-05-14 DIAGNOSIS — E7800 Pure hypercholesterolemia, unspecified: Secondary | ICD-10-CM

## 2020-05-14 DIAGNOSIS — R0609 Other forms of dyspnea: Secondary | ICD-10-CM

## 2020-05-14 DIAGNOSIS — I2510 Atherosclerotic heart disease of native coronary artery without angina pectoris: Secondary | ICD-10-CM | POA: Diagnosis not present

## 2020-05-14 DIAGNOSIS — R0600 Dyspnea, unspecified: Secondary | ICD-10-CM

## 2020-05-14 DIAGNOSIS — I10 Essential (primary) hypertension: Secondary | ICD-10-CM | POA: Diagnosis not present

## 2020-05-14 NOTE — Patient Instructions (Signed)
Medication Instructions:  Continue same medications *If you need a refill on your cardiac medications before your next appointment, please call your pharmacy*   Lab Work: Cbc,Ferrtin  today   Testing/Procedures: Lexiscan   Follow-Up: At [facility], you and your health needs are our priority.  As part of our continuing mission to provide you with exceptional heart care, we have created designated Provider Care Teams.  These Care Teams include your primary Cardiologist (physician) and Advanced Practice Providers (APPs -  Physician Assistants and Nurse Practitioners) who all work together to provide you with the care you need, when you need it.  We recommend signing up for the patient portal called "MyChart".  Sign up information is provided on this After Visit Summary.  MyChart is used to connect with patients for Virtual Visits (Telemedicine).  Patients are able to view lab/test results, encounter notes, upcoming appointments, etc.  Non-urgent messages can be sent to your provider as well.   To learn more about what you can do with MyChart, go to [email or web address].    Your next appointment: 6 months   Call in Feb to schedule May appointment     The format for your next appointment: Office    Provider:  Dr.Jordan   Schedule appointment in January with Pharmacist in [facility]

## 2020-05-15 LAB — CBC WITH DIFFERENTIAL/PLATELET
Basophils Absolute: 0.1 10*3/uL (ref 0.0–0.2)
Basos: 1 %
EOS (ABSOLUTE): 0.3 10*3/uL (ref 0.0–0.4)
Eos: 5 %
Hematocrit: 31.6 % — ABNORMAL LOW (ref 37.5–51.0)
Hemoglobin: 9.9 g/dL — ABNORMAL LOW (ref 13.0–17.7)
Immature Grans (Abs): 0 10*3/uL (ref 0.0–0.1)
Immature Granulocytes: 1 %
Lymphocytes Absolute: 1.5 10*3/uL (ref 0.7–3.1)
Lymphs: 27 %
MCH: 24.1 pg — ABNORMAL LOW (ref 26.6–33.0)
MCHC: 31.3 g/dL — ABNORMAL LOW (ref 31.5–35.7)
MCV: 77 fL — ABNORMAL LOW (ref 79–97)
Monocytes Absolute: 0.7 10*3/uL (ref 0.1–0.9)
Monocytes: 13 %
Neutrophils Absolute: 2.9 10*3/uL (ref 1.4–7.0)
Neutrophils: 53 %
Platelets: 349 10*3/uL (ref 150–450)
RBC: 4.1 x10E6/uL — ABNORMAL LOW (ref 4.14–5.80)
RDW: 15.7 % — ABNORMAL HIGH (ref 11.6–15.4)
WBC: 5.5 10*3/uL (ref 3.4–10.8)

## 2020-05-15 LAB — FERRITIN: Ferritin: 10 ng/mL — ABNORMAL LOW (ref 30–400)

## 2020-05-18 ENCOUNTER — Other Ambulatory Visit: Payer: Self-pay | Admitting: Family

## 2020-05-18 MED ORDER — ATORVASTATIN CALCIUM 80 MG PO TABS: 80.0000 mg | ORAL_TABLET | Freq: Every day | ORAL | 3 refills | Status: DC

## 2020-05-20 ENCOUNTER — Other Ambulatory Visit: Payer: Self-pay

## 2020-05-20 MED ORDER — FERROUS SULFATE 325 (65 FE) MG PO TBEC: 325.0000 mg | DELAYED_RELEASE_TABLET | Freq: Every day | ORAL | 3 refills | Status: DC

## 2020-05-22 ENCOUNTER — Other Ambulatory Visit: Payer: Self-pay | Admitting: Family

## 2020-05-22 ENCOUNTER — Ambulatory Visit (HOSPITAL_COMMUNITY)
Admission: RE | Admit: 2020-05-22 | Discharge: 2020-05-22 | Disposition: A | Discharge status: Home / Self Care | Payer: Medicare Other | Source: Ambulatory Visit | Attending: Cardiology | Admitting: Cardiology

## 2020-05-22 ENCOUNTER — Other Ambulatory Visit: Payer: Self-pay

## 2020-05-22 DIAGNOSIS — I2510 Atherosclerotic heart disease of native coronary artery without angina pectoris: Secondary | ICD-10-CM

## 2020-05-22 DIAGNOSIS — I10 Essential (primary) hypertension: Secondary | ICD-10-CM

## 2020-05-22 DIAGNOSIS — R0600 Dyspnea, unspecified: Secondary | ICD-10-CM | POA: Insufficient documentation

## 2020-05-22 DIAGNOSIS — E7800 Pure hypercholesterolemia, unspecified: Secondary | ICD-10-CM

## 2020-05-22 DIAGNOSIS — D509 Iron deficiency anemia, unspecified: Secondary | ICD-10-CM

## 2020-05-22 LAB — MYOCARDIAL PERFUSION IMAGING
LV dias vol: 147 mL (ref 62–150)
LV sys vol: 64 mL
Peak HR: 86 {beats}/min
Rest HR: 75 {beats}/min
SDS: 1
SRS: 2
SSS: 3
TID: 1.02

## 2020-05-22 MED ORDER — REGADENOSON 0.4 MG/5ML IV SOLN
0.4000 mg | Freq: Once | INTRAVENOUS | Status: AC
  Administered 2020-05-22: 0.4 mg via INTRAVENOUS

## 2020-05-22 MED ORDER — TECHNETIUM TC 99M TETROFOSMIN IV KIT
31.7000 | PACK | Freq: Once | INTRAVENOUS | Status: AC | PRN
  Administered 2020-05-22: 31.7 via INTRAVENOUS
  Filled 2020-05-22: qty 32

## 2020-05-22 MED ORDER — TECHNETIUM TC 99M TETROFOSMIN IV KIT
10.2000 | PACK | Freq: Once | INTRAVENOUS | Status: AC | PRN
  Administered 2020-05-22: 10.2 via INTRAVENOUS
  Filled 2020-05-22: qty 11

## 2020-05-29 ENCOUNTER — Telehealth: Payer: Self-pay | Admitting: Physician Assistant

## 2020-05-29 NOTE — Telephone Encounter (Signed)
Scheduled appointment per new patient referral. Left voicemail with appointment date and time.

## 2020-06-12 NOTE — Progress Notes (Signed)
[facility] Telephone:([phone]   Fax:([phone]) [phone]  CONSULT NOTE  REFERRING PHYSICIAN: Jodi Mourning  REASON FOR CONSULTATION:  Iron Deficiency Anemia  HPI Aaron [name] is a 74 y.o. male with a past medical history significant for recurrent DVT/PE in 2007 and 2012 for which he is currently taking Xarelto, coronary artery disease, hypertension, obstructive sleep apnea, GERD, osteoarthritis, hyperlipidemia, depression, prediabetes, and insomnia is referred to the clinic for evaluation of iron deficiency anemia.  The patient recently had an appointment with his cardiologist on 05/14/2020. He was reporting increased shortness of breath with exertion at that appointment. At this visit, routine lab work with a CBC was performed which noted anemia with a hemoglobin of 9.9 and an MCV low at 77.  The RDW was elevated at 15.8.  Per chart review, it appears that the patient has had mild intermittent anemia for the last 9 years or so.  The lowest hemoglobin on record is 9.3.  The patient then had ferritin drawn which was low at 10.  He was then subsequently referred to the clinic today for evaluation of iron deficiency anemia.  Overall, the patient is reporting increased fatigue for the last year or so which the patient notes maybe multifactorial with his anemia, depression, and deconditioning due to fear of leaving the house and contracting COVID-19. He is endorsing increasing shortness of breath with exertion/decreased exercise tolerance. He denies chest pain or palpitations.He started taking iron supplements on 05/14/20 and is compliant with this daily. He takes 325 mg of ferrous sulfate. He tolerates this fairly well except for some mild abdominal discomfort.  The patient denies any abnormal bleeding or bruising including epistaxis, gingival bleeding, hemoptysis, hematemesis, melena, hematochezia, or hematuria. He is on Xarelto for a history of DVT/PE. He had a history of  hemorrhoids but states he has not had any bleeding issues for over a year.  He denies any particular dietary habits such as being a vegan or vegetarian. The patient follows with Dr. Collene Mares from gastroenterology. His last colonoscopy was estimated to be in 2007-2008. He states he was supposed to follow up in 5-6 years but has not done so.  He was recently re-referred to GI. They called him to schedule an appointment but he has not called them back to schedule at this time. He denies any nausea or vomiting.He denies abdominal pain, fevers, chills, night sweats, or unexplained weight loss.   He denies any family history of anemia.  The patient's family history consist of a mother who passed away secondary to suicide.  She reportedly also had some heart disease.  The patient's father passed away from what sounds like is COPD.  The patient had a sister who passed away from breast cancer.  The patient had another brother who passed away from polio.  The patient denies any family history of CRC.  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  He has 3 children.  He is currently married to his second wife.  His first wife passed away from breast cancer.  The patient denies alcohol or drug use.  The patient is a former smoker having smoked approximately 2 packs/day for 25 years.   HPI  Past Medical History:  Diagnosis Date  . Anemia    "@ birth"  . Anxiety   . Blood transfusion    "@ birth"  . Cancer ([place])    Head - skin cancer  . Constipation due to pain medication   .  Coronary artery disease    DES LAD 2005  . Depression   . Full dentures   . GERD (gastroesophageal reflux disease)   . 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 Heart murmur   . Hyperlipidemia   . Hypertension   . Insomnia   . Osteoarthritis   . PONV (postoperative nausea and vomiting)    2006 - knee surgery  . Pre-diabetes   . Psoriasis   . PTSD (post-traumatic stress disorder)   . Pulmonary  embolism ([place]) ~ 03/2006   bilaterally  . Seasonal allergies   . Shortness of breath 06/08/11   "w/exertion; that's why I'm here"  . Sleep apnea, obstructive   . Umbilical hernia     Past Surgical History:  Procedure Laterality Date  . CARDIAC CATHETERIZATION    . carpel tunnel release Bilateral ~ 06/2005/~ 08/2005   right/left  . COLONOSCOPY W/ BIOPSIES AND POLYPECTOMY    . CORONARY ANGIOPLASTY    . CORONARY STENT PLACEMENT  02/13/2004   1  . ENUCLEATION Right    age 35  . EYE SURGERY     age 31; "ruptured  right pupil"  . EYE SURGERY     age 19  . EYE SURGERY  1957   placed artificial right eye  . INGUINAL HERNIA REPAIR Right 1983  . INGUINAL HERNIA REPAIR Left 1985  . KNEE ARTHROSCOPY  ~ 06/2005   right  . MULTIPLE TOOTH EXTRACTIONS    . spinal injections  2006-2007   "maybe 10 injections for herniated discs"  . TONSILLECTOMY     "when I was real young"  . TOTAL KNEE ARTHROPLASTY Left 10/09/2015   Procedure: TOTAL KNEE ARTHROPLASTY;  Surgeon: Dorna Leitz, MD;  Location: [facility];  Service: Orthopedics;  Laterality: Left;  . TOTAL KNEE ARTHROPLASTY Right 04/04/2016   Procedure: TOTAL KNEE ARTHROPLASTY;  Surgeon: Dorna Leitz, MD;  Location: [facility];  Service: Orthopedics;  Laterality: Right;    Family History  Problem Relation Age of Onset  . Asthma Father   . Emphysema Father   . Breast cancer Sister   . Liver cancer Sister   . Asthma Sister   . Asthma Brother     Social History Social History   Tobacco Use  . Smoking status: Former Smoker    Packs/day: 1.50    Years: 25.00    Pack years: 37.50    Types: Cigarettes    Quit date: 07/10/1995    Years since quitting: 24.9  . Smokeless tobacco: Never Used  Vaping Use  . Vaping Use: Never used  Substance Use Topics  . Alcohol use: No  . Drug use: No    Allergies  Allergen Reactions  . Wellbutrin [Bupropion] Other (See Comments)    Hallucinations, sweating  . Amoxicillin Palpitations    Current Outpatient  Medications  Medication Sig Dispense Refill  . ALPRAZolam (XANAX) 1 MG tablet TAKE 1 TABLET (1 MG TOTAL) BY MOUTH AT BEDTIME AS NEEDED FOR ANXIETY OR SLEEP. 30 tablet 1  . amLODipine (NORVASC) 5 MG tablet TAKE 1 TABLET (5 MG TOTAL) BY MOUTH DAILY. AT BEDTIME 90 tablet 3  . atorvastatin (LIPITOR) 80 MG tablet Take 1 tablet (80 mg total) by mouth daily. 90 tablet 3  . fenofibrate 160 MG tablet TAKE 1 TABLET BY MOUTH  DAILY 90 tablet 3  . ferrous sulfate 325 (65 FE) MG EC tablet Take 1 tablet (325 mg total) by mouth daily  with breakfast. 30 tablet 3  . pantoprazole (PROTONIX) 40 MG tablet Take 1 tablet (40 mg total) by mouth daily. 90 tablet 3  . telmisartan-hydrochlorothiazide (MICARDIS HCT) 80-25 MG tablet TAKE 1 TABLET BY MOUTH EVERY DAY 90 tablet 1  . traMADol (ULTRAM) 50 MG tablet TAKE 1-2 TABLETS (50-100 MG TOTAL) BY MOUTH EVERY 8 (EIGHT) HOURS AS NEEDED. FOR PAIN 180 tablet 1  . traZODone (DESYREL) 50 MG tablet TAKE 1/2 TO 1 TABLET BY MOUTH AT BEDTIME AS NEEDED FOR SLEEP 90 tablet 1  . venlafaxine XR (EFFEXOR-XR) 75 MG 24 hr capsule TAKE 1 CAPSULE BY MOUTH  DAILY 90 capsule 3  . XARELTO 20 MG TABS tablet TAKE 1 TABLET BY MOUTH  DAILY WITH SUPPER 90 tablet 3   No current facility-administered medications for this visit.    REVIEW OF SYSTEMS:   Review of Systems  Constitutional: Positive for fatigue. Negative for appetite change, chills, fatigue, fever and unexpected weight change.  HENT: Negative for mouth sores, nosebleeds, sore throat and trouble swallowing.   Eyes: Negative for eye problems and icterus.  Respiratory: Positive for shortness of breath with exertion. Negative for cough, hemoptysis, and wheezing.   Cardiovascular: Negative for chest pain and leg swelling.  Gastrointestinal: Negative for abdominal pain, constipation, diarrhea, nausea and vomiting.  Genitourinary: Negative for bladder incontinence, difficulty urinating, dysuria, frequency and hematuria.   Musculoskeletal:  Negative for back pain, gait problem, neck pain and neck stiffness.  Skin: Negative for itching and rash.  Neurological: Negative for dizziness, extremity weakness, gait problem, headaches, light-headedness and seizures.  Hematological: Negative for adenopathy. Does not bruise/bleed easily.  Psychiatric/Behavioral: Negative for confusion, depression and sleep disturbance. The patient is not nervous/anxious.     PHYSICAL EXAMINATION:  There were no vitals taken for this visit.  ECOG PERFORMANCE STATUS: 1  Physical Exam  Constitutional: Oriented to person, place, and time and well-developed, well-nourished, and in no distress.  HENT:  Head: Normocephalic and atraumatic.  Mouth/Throat: Oropharynx is clear and moist. No oropharyngeal exudate.  Eyes: Conjunctivae are normal. Right eye exhibits no discharge. Left eye exhibits no discharge. No scleral icterus.  Neck: Normal range of motion. Neck supple.  Cardiovascular: Normal rate, regular rhythm, normal heart sounds and intact distal pulses.   Pulmonary/Chest: Effort normal and breath sounds normal. No respiratory distress. No wheezes. No rales.  Abdominal: Soft. Umbilical hernia noted. Bowel sounds are normal. Exhibits no distension and no mass. There is no tenderness.  Musculoskeletal: Normal range of motion. Exhibits no edema.  Lymphadenopathy:    No cervical adenopathy.  Neurological: Alert and oriented to person, place, and time. Exhibits normal muscle tone. Gait normal. Coordination normal. Ambulates with a cane.  Skin: Skin is warm and dry. No rash noted. Not diaphoretic. No erythema. No pallor.  Psychiatric: Mood, memory and judgment normal.  Vitals reviewed.   LABORATORY DATA: Lab Results  Component Value Date   WBC 5.9 06/15/2020   HGB 10.0 (L) 06/15/2020   HCT 32.2 (L) 06/15/2020   MCV 79.3 (L) 06/15/2020   PLT 279 06/15/2020      Chemistry      Component Value Date/Time   NA 139 06/15/2020 1306   K 4.2 06/15/2020  1306   CL 106 06/15/2020 1306   CO2 25 06/15/2020 1306   BUN 21 06/15/2020 1306   CREATININE 1.24 06/15/2020 1306   CREATININE 1.16 02/10/2020 1532      Component Value Date/Time   CALCIUM 9.0 06/15/2020 1306   ALKPHOS  70 06/15/2020 1306   AST 17 06/15/2020 1306   ALT 12 06/15/2020 1306   BILITOT 0.4 06/15/2020 1306       RADIOGRAPHIC STUDIES: Myocardial Perfusion Imaging  Result Date: 05/22/2020  The left ventricular ejection fraction is normal (55-65%).  Nuclear stress EF: 56%.  There was no ST segment deviation noted during stress.  No T wave inversion was noted during stress.  The study is normal.  This is a low risk study.  Low risk stress nuclear study with normal perfusion and normal left ventricular regional and global systolic function.   ASSESSMENT: This is a very pleasant 74 year old Caucasian male  referred to the clinic for microcytic anemia secondary to iron deficiency.    PLAN: The patient was seen with Dr. Julien Nordmann today. The patient had several lab studies performed today including a CBC, CMP, LDH, SPEP with immunofixation, folic acid, E07, and iron studies performed. His CBC shows microcytic anemia with a MCV of 79. His hemoglobin is 10.0. His WBC and  Platelets were within normal limits. The patient's CMP is unremarkable. His iron studies show low ferritin at 13, and low iron at  32. His saturation is low at 8%.The patient's other lab studies are pending at this time.  Dr. Julien Nordmann recommends that we arrange for IV iron infusions with Venofer weekly x4.  We will see her back for a follow up in 3 months for evaluation and for a repeat CBC, iron studies, and ferritin.   He will continue to take his oral iron supplement as well. He was given a handout on an iron rich diet.   The patient was instructed to follow up with GI as scheduled. We will arrange for him to have stool cards performed for blood loss in the meantime.   The patient voices  understanding of current disease status and treatment options and is in agreement with the current care plan.  All questions were answered. The patient knows to call the clinic with any problems, questions or concerns. We can certainly see the patient much sooner if necessary.  Thank you so much for allowing me to participate in the care of Aaron [name]. I will continue to follow up the patient with you and assist in his care.  The total time spent in the appointment was 60 minutes.  Disclaimer: This note was dictated with voice recognition software. Similar sounding words can inadvertently be transcribed and may not be corrected upon review.   Chezney Huether L Neeko Pharo June 15, 2020, 3:09 PM  ADDENDUM: Hematology/Oncology Attending: I had a face-to-face encounter with the patient today.  I recommended his care plan.  This is a very pleasant 74 years old white male with history of deep venous thrombosis as well as pulmonary embolism diagnosed in 2007 and has been on treatment with anticoagulation with Xarelto since that time.  The patient has been complaining of increasing shortness of breath recently and he was seen by his cardiologist and his hemoglobin was found to be low with also low MCV consistent with microcytic anemia.  His last colonoscopy was in 2008 by Dr. Collene Mares.  The patient was supposed to have repeat colonoscopy few years ago but he missed his appointment. Repeat blood work recently showed persistent iron deficiency.  He also has a history of hemorrhoids. We order several studies today for evaluation of his microcytic anemia including repeat iron study and ferritin.  His ferritin level is still low at 13, serum iron 32 and iron saturation 8%.  We will also order stool for Hemoccult and still pending. I recommended for the patient to receive intravenous iron infusion as he is not getting much benefit from the oral iron tablets. I also strongly recommend for the patient to follow  with Dr. Collene Mares for consideration of repeat colonoscopy. The patient will come back for follow-up visit in 3 months for evaluation and repeat blood work. He was advised to call immediately if he has any concerning symptoms in the interval.  Disclaimer: This note was dictated with voice recognition software. Similar sounding words can inadvertently be transcribed and may be missed upon review. Eilleen Kempf, MD 06/15/20

## 2020-06-15 ENCOUNTER — Inpatient Hospital Stay: Payer: Medicare Other

## 2020-06-15 ENCOUNTER — Other Ambulatory Visit: Payer: Self-pay | Admitting: Physician Assistant

## 2020-06-15 ENCOUNTER — Other Ambulatory Visit: Payer: Self-pay | Admitting: Family

## 2020-06-15 ENCOUNTER — Encounter: Payer: Self-pay | Admitting: Physician Assistant

## 2020-06-15 ENCOUNTER — Other Ambulatory Visit: Payer: Self-pay

## 2020-06-15 ENCOUNTER — Inpatient Hospital Stay: Payer: Medicare Other | Attending: Physician Assistant | Admitting: Physician Assistant

## 2020-06-15 DIAGNOSIS — G4700 Insomnia, unspecified: Secondary | ICD-10-CM | POA: Diagnosis not present

## 2020-06-15 DIAGNOSIS — E785 Hyperlipidemia, unspecified: Secondary | ICD-10-CM | POA: Insufficient documentation

## 2020-06-15 DIAGNOSIS — F32A Depression, unspecified: Secondary | ICD-10-CM | POA: Insufficient documentation

## 2020-06-15 DIAGNOSIS — M1990 Unspecified osteoarthritis, unspecified site: Secondary | ICD-10-CM | POA: Insufficient documentation

## 2020-06-15 DIAGNOSIS — R0609 Other forms of dyspnea: Secondary | ICD-10-CM | POA: Diagnosis not present

## 2020-06-15 DIAGNOSIS — R5383 Other fatigue: Secondary | ICD-10-CM | POA: Diagnosis not present

## 2020-06-15 DIAGNOSIS — F4310 Post-traumatic stress disorder, unspecified: Secondary | ICD-10-CM | POA: Insufficient documentation

## 2020-06-15 DIAGNOSIS — R7303 Prediabetes: Secondary | ICD-10-CM | POA: Diagnosis not present

## 2020-06-15 DIAGNOSIS — K219 Gastro-esophageal reflux disease without esophagitis: Secondary | ICD-10-CM | POA: Diagnosis not present

## 2020-06-15 DIAGNOSIS — I10 Essential (primary) hypertension: Secondary | ICD-10-CM | POA: Insufficient documentation

## 2020-06-15 DIAGNOSIS — D649 Anemia, unspecified: Secondary | ICD-10-CM

## 2020-06-15 DIAGNOSIS — Z86711 Personal history of pulmonary embolism: Secondary | ICD-10-CM | POA: Insufficient documentation

## 2020-06-15 DIAGNOSIS — Z85828 Personal history of other malignant neoplasm of skin: Secondary | ICD-10-CM | POA: Insufficient documentation

## 2020-06-15 DIAGNOSIS — Z803 Family history of malignant neoplasm of breast: Secondary | ICD-10-CM | POA: Diagnosis not present

## 2020-06-15 DIAGNOSIS — D509 Iron deficiency anemia, unspecified: Secondary | ICD-10-CM | POA: Insufficient documentation

## 2020-06-15 DIAGNOSIS — Z79899 Other long term (current) drug therapy: Secondary | ICD-10-CM | POA: Insufficient documentation

## 2020-06-15 DIAGNOSIS — Z7901 Long term (current) use of anticoagulants: Secondary | ICD-10-CM | POA: Diagnosis not present

## 2020-06-15 DIAGNOSIS — I2510 Atherosclerotic heart disease of native coronary artery without angina pectoris: Secondary | ICD-10-CM | POA: Diagnosis not present

## 2020-06-15 DIAGNOSIS — G4733 Obstructive sleep apnea (adult) (pediatric): Secondary | ICD-10-CM | POA: Diagnosis not present

## 2020-06-15 DIAGNOSIS — Z87891 Personal history of nicotine dependence: Secondary | ICD-10-CM | POA: Insufficient documentation

## 2020-06-15 LAB — IRON AND TIBC
Iron: 32 ug/dL — ABNORMAL LOW (ref 42–163)
Saturation Ratios: 8 % — ABNORMAL LOW (ref 20–55)
TIBC: 398 ug/dL (ref 202–409)
UIBC: 366 ug/dL (ref 117–376)

## 2020-06-15 LAB — CMP (CANCER CENTER ONLY)
ALT: 12 U/L (ref 0–44)
AST: 17 U/L (ref 15–41)
Albumin: 3.8 g/dL (ref 3.5–5.0)
Alkaline Phosphatase: 70 U/L (ref 38–126)
Anion gap: 8 (ref 5–15)
BUN: 21 mg/dL (ref 8–23)
CO2: 25 mmol/L (ref 22–32)
Calcium: 9 mg/dL (ref 8.9–10.3)
Chloride: 106 mmol/L (ref 98–111)
Creatinine: 1.24 mg/dL (ref 0.61–1.24)
GFR, Estimated: >60 mL/min (ref >60)
Glucose, Bld: 106 mg/dL — ABNORMAL HIGH (ref 70–99)
Potassium: 4.2 mmol/L (ref 3.5–5.1)
Sodium: 139 mmol/L (ref 135–145)
Total Bilirubin: 0.4 mg/dL (ref 0.3–1.2)
Total Protein: 7.1 g/dL (ref 6.5–8.1)

## 2020-06-15 LAB — CBC WITH DIFFERENTIAL (CANCER CENTER ONLY)
Abs Immature Granulocytes: 0.02 10*3/uL (ref 0.00–0.07)
Basophils Absolute: 0.1 10*3/uL (ref 0.0–0.1)
Basophils Relative: 1 %
Eosinophils Absolute: 0.2 10*3/uL (ref 0.0–0.5)
Eosinophils Relative: 4 %
HCT: 32.2 % — ABNORMAL LOW (ref 39.0–52.0)
Hemoglobin: 10 g/dL — ABNORMAL LOW (ref 13.0–17.0)
Immature Granulocytes: 0 %
Lymphocytes Relative: 21 %
Lymphs Abs: 1.3 10*3/uL (ref 0.7–4.0)
MCH: 24.6 pg — ABNORMAL LOW (ref 26.0–34.0)
MCHC: 31.1 g/dL (ref 30.0–36.0)
MCV: 79.3 fL — ABNORMAL LOW (ref 80.0–100.0)
Monocytes Absolute: 0.7 10*3/uL (ref 0.1–1.0)
Monocytes Relative: 12 %
Neutro Abs: 3.7 10*3/uL (ref 1.7–7.7)
Neutrophils Relative %: 62 %
Platelet Count: 279 10*3/uL (ref 150–400)
RBC: 4.06 MIL/uL — ABNORMAL LOW (ref 4.22–5.81)
RDW: 18.5 % — ABNORMAL HIGH (ref 11.5–15.5)
WBC Count: 5.9 10*3/uL (ref 4.0–10.5)
nRBC: 0 % (ref 0.0–0.2)

## 2020-06-15 LAB — FERRITIN: Ferritin: 13 ng/mL — ABNORMAL LOW (ref 24–336)

## 2020-06-15 LAB — VITAMIN B12: Vitamin B-12: 231 pg/mL (ref 180–914)

## 2020-06-15 LAB — FOLATE: Folate: 13.1 ng/mL (ref >5.9)

## 2020-06-15 NOTE — Patient Instructions (Addendum)
-I am still waiting on your iron labs from today, but we will arrange for IV iron infusions to boost your iron faster. I don't have your iron labs back yet from today, so if they look really good, I may call you and tell you that we are cancelling the IV iron and to continue just taking the tablets by mouth.  Please still take your iron tablets by mouth either way if you get the iron infusion or not.  -We will see you back in 3 months to recheck your iron -Please get the colonoscopy as planned -Please return the stool cards once completed.    Iron Deficiency Anemia, Adult Iron-deficiency anemia is when you have a low amount of red blood cells or hemoglobin. This happens because you have too little iron in your body. Hemoglobin carries oxygen to parts of the body. Anemia can cause your body to not get enough oxygen. It may or may not cause symptoms. Follow these instructions at home: Medicines  Take over-the-counter and prescription medicines only as told by your doctor. This includes iron pills (supplements) and vitamins.  If you cannot handle taking iron pills by mouth, ask your doctor about getting iron through: ? A vein (intravenously). ? A shot (injection) into a muscle.  Take iron pills when your stomach is empty. If you cannot handle this, take them with food.  Do not drink milk or take antacids at the same time as your iron pills.  To prevent trouble pooping (constipation), eat fiber or take medicine (stool softener) as told by your doctor. Eating and drinking   Talk with your doctor before changing the foods you eat. He or she may tell you to eat foods that have a lot of iron, such as: ? Liver. ? Lowfat (lean) beef. ? Breads and cereals that have iron added to them (fortified breads and cereals). ? Eggs. ? Dried fruit. ? Dark green, leafy vegetables.  Drink enough fluid to keep your pee (urine) clear or pale yellow.  Eat fresh fruits and vegetables that are high in vitamin  C. They help your body to use iron. Foods with a lot of vitamin C include: ? Oranges. ? Peppers. ? Tomatoes. ? Mangoes. General instructions  Return to your normal activities as told by your doctor. Ask your doctor what activities are safe for you.  Keep yourself clean, and keep things clean around you (your surroundings). Anemia can make you get sick more easily.  Keep all follow-up visits as told by your doctor. This is important. Contact a doctor if:  You feel sick to your stomach (nauseous).  You throw up (vomit).  You feel weak.  You are sweating for no clear reason.  You have trouble pooping, such as: ? Pooping (having a bowel movement) less than 3 times a week. ? Straining to poop. ? Having poop that is hard, dry, or larger than normal. ? Feeling full or bloated. ? Pain in the lower belly. ? Not feeling better after pooping. Get help right away if:  You pass out (faint). If this happens, do not drive yourself to the hospital. Call your local emergency services (911 in the [place]  You have chest pain.  You have shortness of breath that: ? Is very bad. ? Gets worse with physical activity.  You have a fast heartbeat.  You get light-headed when getting up from sitting or lying down. This information is not intended to replace advice given to you by your health care  provider. Make sure you discuss any questions you have with your health care provider. Document Revised: 06/02/2017 Document Reviewed: 03/09/2016 Elsevier Patient Education  [place].

## 2020-06-16 LAB — PROTEIN ELECTROPHORESIS, SERUM, WITH REFLEX
A/G Ratio: 1.1 (ref 0.7–1.7)
Albumin ELP: 3.4 g/dL (ref 2.9–4.4)
Alpha-1-Globulin: 0.2 g/dL (ref 0.0–0.4)
Alpha-2-Globulin: 0.7 g/dL (ref 0.4–1.0)
Beta Globulin: 1.2 g/dL (ref 0.7–1.3)
Gamma Globulin: 1 g/dL (ref 0.4–1.8)
Globulin, Total: 3.1 g/dL (ref 2.2–3.9)
Total Protein ELP: 6.5 g/dL (ref 6.0–8.5)

## 2020-07-06 ENCOUNTER — Ambulatory Visit: Payer: Medicare Other

## 2020-07-10 ENCOUNTER — Telehealth: Payer: Self-pay | Admitting: Internal Medicine

## 2020-07-10 NOTE — Telephone Encounter (Signed)
Rescheduled per pt. Called and spoke with pt, r/s appts and confirmed date and times

## 2020-07-12 ENCOUNTER — Other Ambulatory Visit: Payer: Self-pay | Admitting: Family

## 2020-07-13 ENCOUNTER — Ambulatory Visit: Payer: Medicare Other

## 2020-07-13 DIAGNOSIS — G4733 Obstructive sleep apnea (adult) (pediatric): Secondary | ICD-10-CM | POA: Diagnosis not present

## 2020-07-14 ENCOUNTER — Ambulatory Visit: Payer: Medicare Other

## 2020-07-20 ENCOUNTER — Ambulatory Visit: Payer: Medicare Other

## 2020-07-23 ENCOUNTER — Telehealth: Payer: Self-pay | Admitting: Physician Assistant

## 2020-07-23 NOTE — Telephone Encounter (Signed)
I received a message that this patient called to cancel his venofer appointments. I called the patient to check to see why he was cancelling and to see if there was anything I can help with. He states he is nervous due to the COVID-19 pandemic. We will keep his follow up as scheduled for March. In the meantime, he was encouraged to continue taking his oral iron supplement with vitamin C and increase his intake of iron rich food.

## 2020-07-27 ENCOUNTER — Ambulatory Visit: Payer: Medicare Other

## 2020-07-31 IMAGING — MR MR LUMBAR SPINE W/O CM
4 of 5 series · 18 of 48 positions shown · non-contrast
Comparison: Lumbar spine MRI 01/21/2015

CLINICAL DATA: Low back pain

EXAM:
MRI LUMBAR SPINE WITHOUT CONTRAST
TECHNIQUE: Multiplanar, multisequence MR imaging of the lumbar spine was
performed. No intravenous contrast was administered.

[Series 6: T2 · sagittal · 4.0mm · 0.73mm/px · 5 of 15 slices shown (1 of 2)]
[im 1/15]
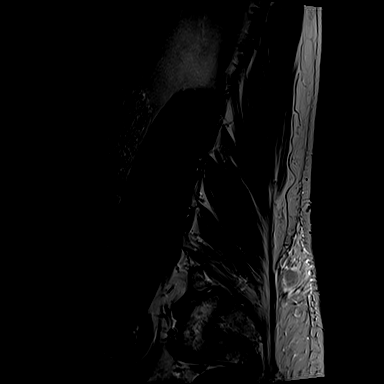
[im 4/15]
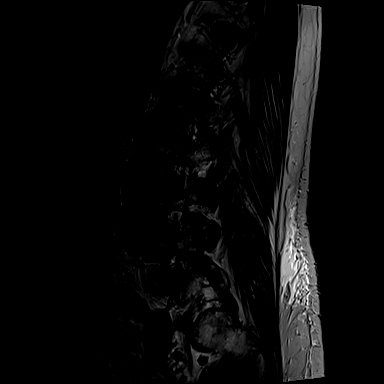
[im 8/15]
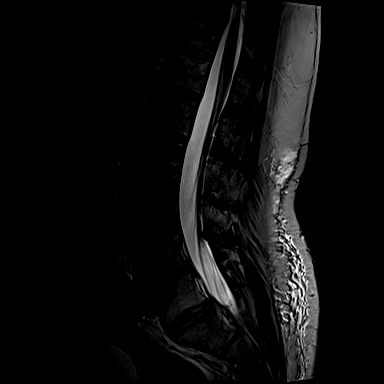
[im 11/15]
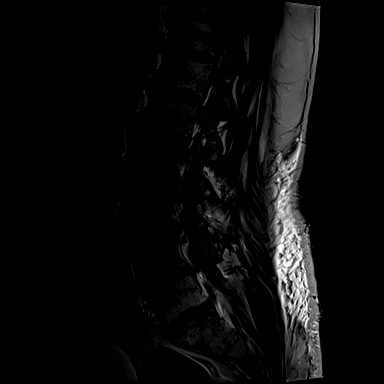
[im 15/15]
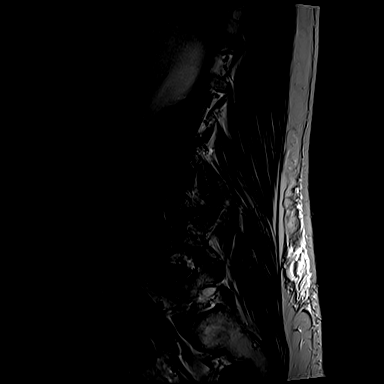

[Series 7: T1 · sagittal · 4.0mm · 0.73mm/px · 3 of 15 slices shown (1 of 2)]
[im 1/15]
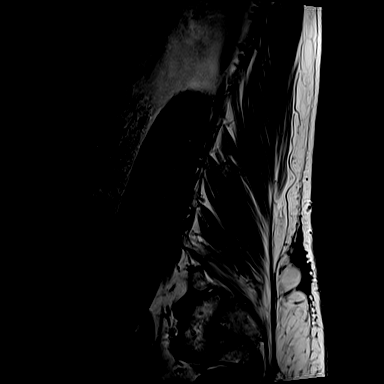
[im 8/15]
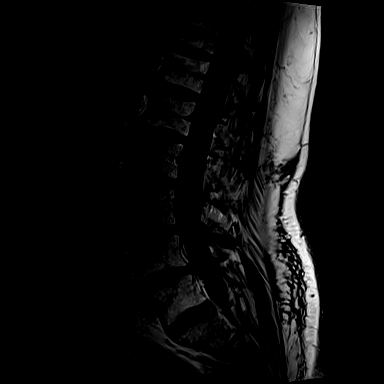
[im 15/15]
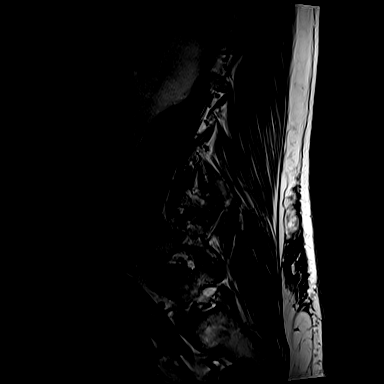

[Series 11: T2 · axial · 4.0mm · 0.28mm/px · z∈[-112,+96]mm · 7 of 42 slices shown (2 of 2)]
[im 3/42]
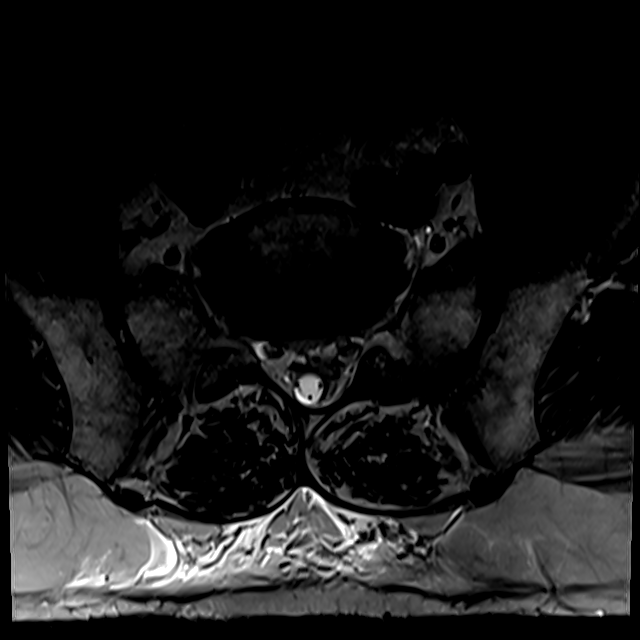
[im 6/42]
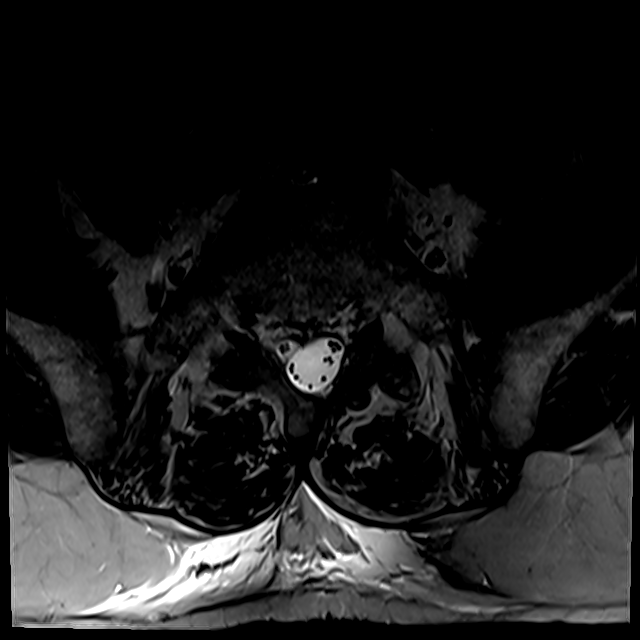
[im 9/42]
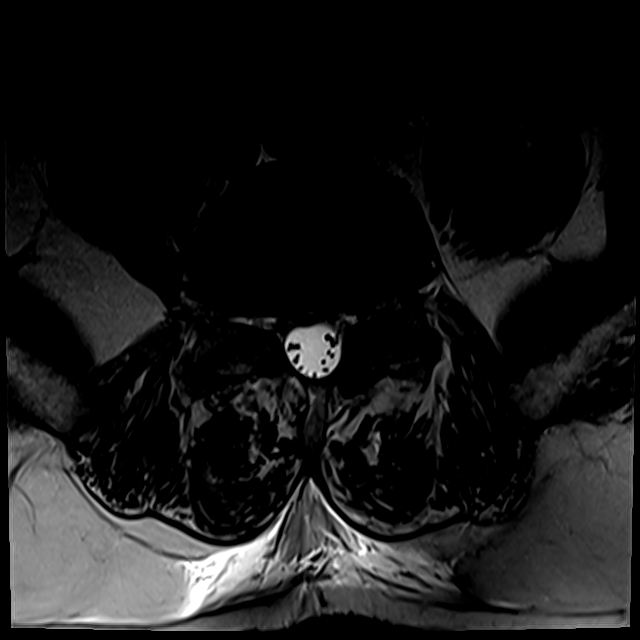
[im 14/42]
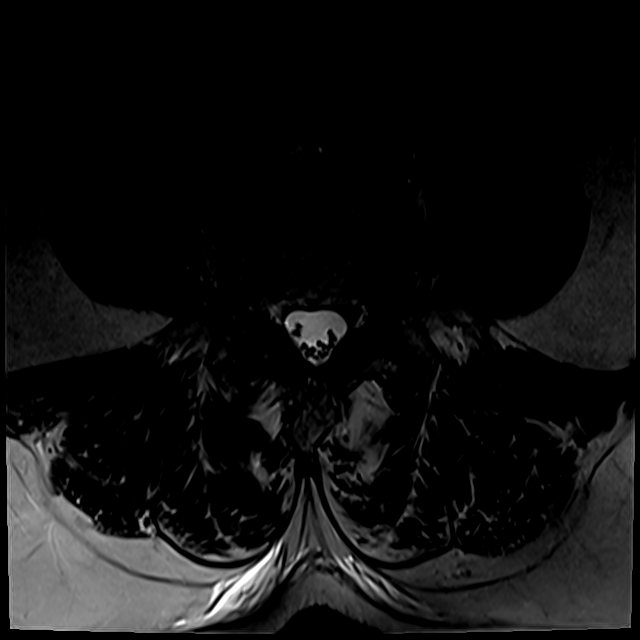
[im 20/42]
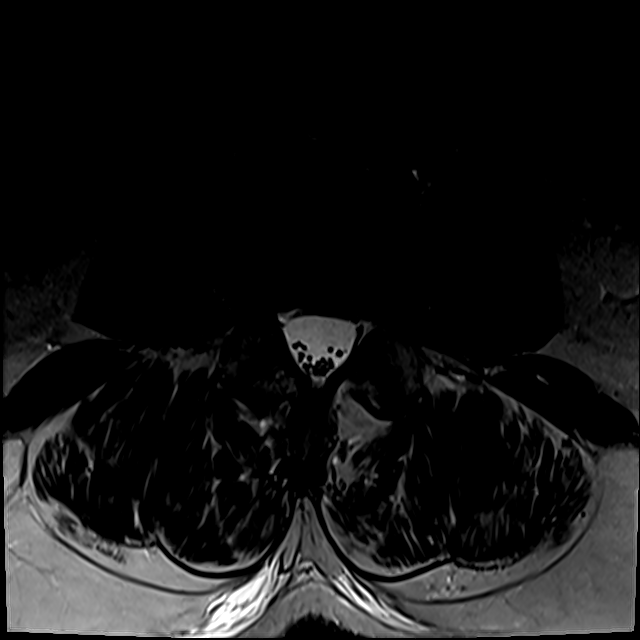
[im 22/42]
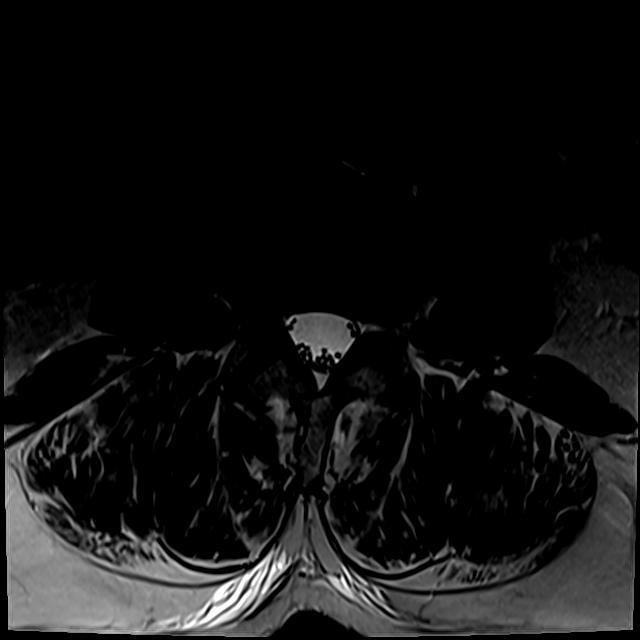
[im 36/42]
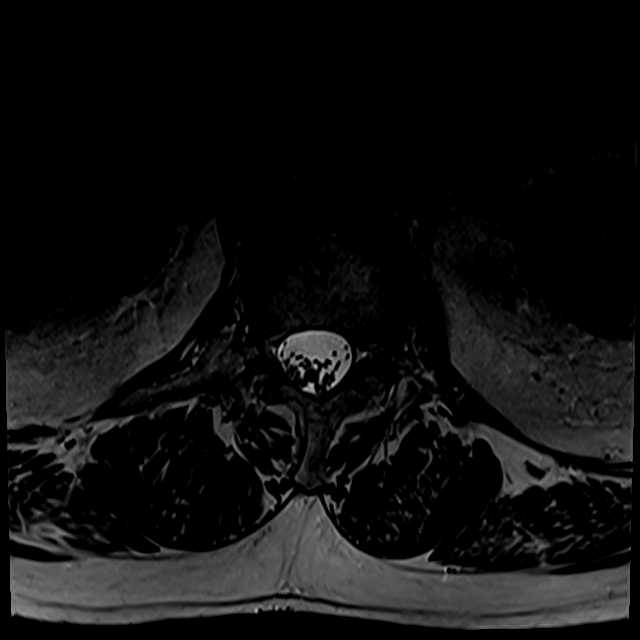

[Series 100: T1 · axial · 4.0mm · 0.28mm/px · z∈[-97,+95]mm · 3 of 42 slices shown (2 of 2)]
[im 6/42]
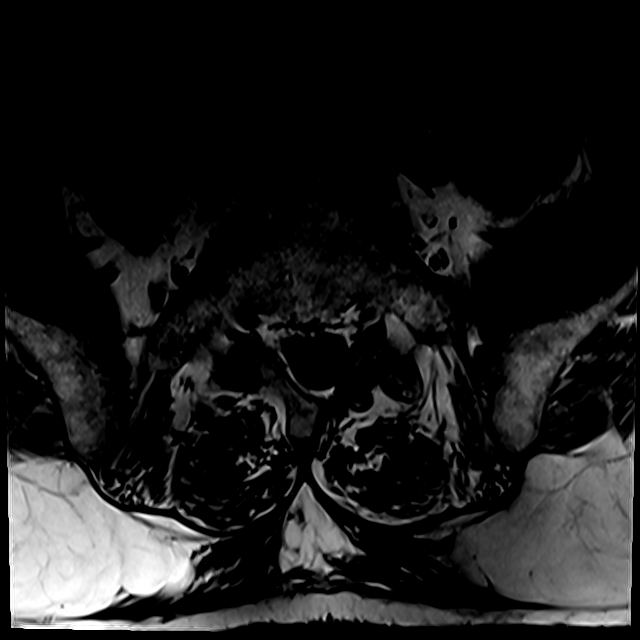
[im 22/42]
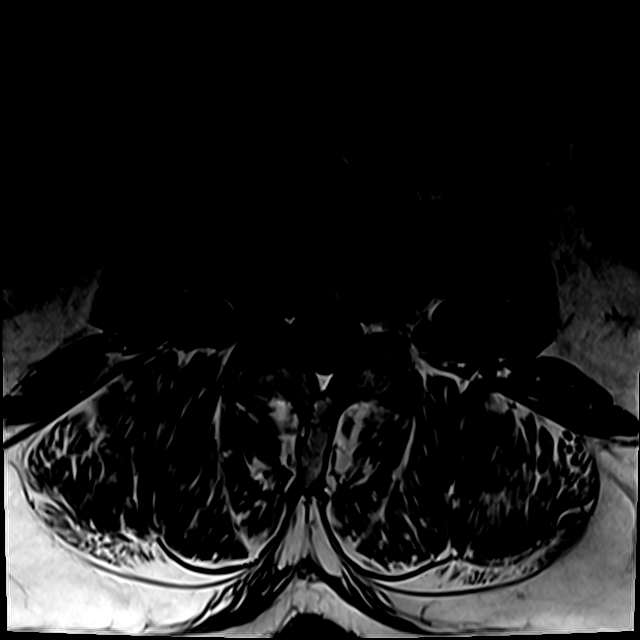
[im 36/42]
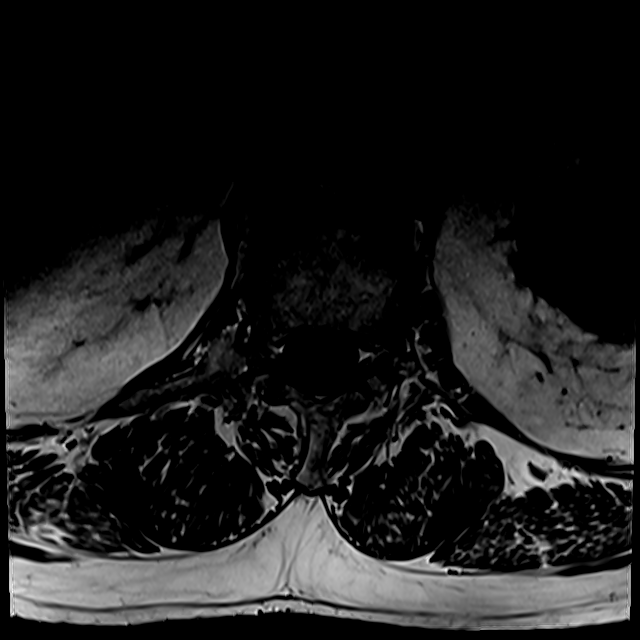

[18 of 48 positions shown; findings below may reference images not displayed]

FINDINGS: Segmentation: Normal. The lowest disc space is considered to be
L5-S1.

Alignment:  Normal

Vertebrae: No acute compression fracture, discitis-osteomyelitis of
focal marrow lesion.

Conus medullaris and cauda equina: The conus medullaris terminates
at the L1 level. The cauda equina and conus medullaris are both
normal.

Paraspinal and other soft tissues: The visualized aorta, IVC and
iliac vessels are normal. The visualized retroperitoneal organs and
paraspinal soft tissues are normal.

Disc levels: The T10-S3 disc spaces are imaged in the sagittal
plane, with axial imaging of the T11-S1 disc levels.

T10-T11 is imaged only in the sagittal plane, but is normal.

T11-T12: Normal.

T12-L1: Normal.

L1-L2: Normal.

L2-L3: Small right eccentric disc bulge and endplate spurring with
mild bilateral neural foraminal stenosis, right worse than left. No
spinal canal stenosis. These findings are unchanged.

L3-L4: Severe bilateral facet hypertrophy is unchanged. Mild diffuse
disc bulge. Unchanged moderate bilateral neural foraminal stenosis.

L4-L5: Small central disc protrusion and moderate facet hypertrophy,
unchanged. No central spinal canal stenosis. Unchanged mild
bilateral neural foraminal stenosis.

L5-S1: Normal disc. No spinal canal or neural foraminal stenosis.
Mild facet hypertrophy.

The visualized portion of the sacrum is normal.
IMPRESSION: Unchanged examination of the lumbar spine. Moderate bilateral L3-4
neural foraminal stenosis remains the dominant finding.

## 2020-08-03 ENCOUNTER — Ambulatory Visit: Payer: Medicare Other

## 2020-08-09 ENCOUNTER — Other Ambulatory Visit: Payer: Self-pay | Admitting: Cardiology

## 2020-08-10 ENCOUNTER — Ambulatory Visit: Payer: Medicare Other

## 2020-08-17 ENCOUNTER — Ambulatory Visit: Payer: Medicare Other

## 2020-09-09 ENCOUNTER — Telehealth: Payer: Self-pay | Admitting: Family

## 2020-09-09 NOTE — Telephone Encounter (Signed)
LDVM instructing pt of Laura's messaging & advising pt to call back to schedule 24mo appt.

## 2020-09-09 NOTE — Telephone Encounter (Signed)
I am recommending that we have him stop his Fenofibrate and just continue his Atorvastatin 80 mg; new data has come out that there is not much benefit from the combination and the Atorvastatin is the medication that offers the cardiac protection we want for him.   Would recommend OV/ re-check in about 1 month please.

## 2020-09-10 ENCOUNTER — Other Ambulatory Visit: Payer: Self-pay | Admitting: Family

## 2020-09-14 NOTE — Telephone Encounter (Addendum)
Pt states that he did receive my message.  He has appt for 09/18/20 to discuss med refill.  Will determine if this appt needed and appt needed for 10/11/20 for med change f/u.

## 2020-09-14 NOTE — Telephone Encounter (Signed)
Let's cancel for Friday and plan to do around 4/9; no need for both appointments.

## 2020-09-14 NOTE — Telephone Encounter (Signed)
Pt notified that meds have been refilled & that appt for 09/18/20 can be rescheduled.  Appt scheduled for 10/12/20.

## 2020-09-14 NOTE — Telephone Encounter (Signed)
Last RF per controlled substance database: Tramadol 08/12/20; Alprazolam 08/12/20  Last OV: 02/10/20  Next OV: 09/18/20; but trying to determine if it needs to be this day or after 4/9/222 (which will be when you wanted to f/u from a change in his meds).

## 2020-09-18 ENCOUNTER — Ambulatory Visit: Payer: Medicare Other | Admitting: Family

## 2020-09-24 ENCOUNTER — Other Ambulatory Visit: Payer: Self-pay

## 2020-09-24 ENCOUNTER — Telehealth: Payer: Self-pay | Admitting: Internal Medicine

## 2020-09-24 ENCOUNTER — Inpatient Hospital Stay: Payer: Medicare Other

## 2020-09-24 ENCOUNTER — Inpatient Hospital Stay: Payer: Medicare Other | Attending: Physician Assistant | Admitting: Internal Medicine

## 2020-09-24 ENCOUNTER — Encounter: Payer: Self-pay | Admitting: Internal Medicine

## 2020-09-24 VITALS — BP 159/64 | HR 89 | Temp 97.8°F | Resp 17 | Ht 66.0 in | Wt 258.5 lb

## 2020-09-24 DIAGNOSIS — Z85828 Personal history of other malignant neoplasm of skin: Secondary | ICD-10-CM | POA: Diagnosis not present

## 2020-09-24 DIAGNOSIS — K922 Gastrointestinal hemorrhage, unspecified: Secondary | ICD-10-CM | POA: Insufficient documentation

## 2020-09-24 DIAGNOSIS — I10 Essential (primary) hypertension: Secondary | ICD-10-CM | POA: Insufficient documentation

## 2020-09-24 DIAGNOSIS — Z86711 Personal history of pulmonary embolism: Secondary | ICD-10-CM | POA: Diagnosis not present

## 2020-09-24 DIAGNOSIS — Z7901 Long term (current) use of anticoagulants: Secondary | ICD-10-CM | POA: Diagnosis not present

## 2020-09-24 DIAGNOSIS — Z79899 Other long term (current) drug therapy: Secondary | ICD-10-CM | POA: Insufficient documentation

## 2020-09-24 DIAGNOSIS — D500 Iron deficiency anemia secondary to blood loss (chronic): Secondary | ICD-10-CM | POA: Diagnosis not present

## 2020-09-24 DIAGNOSIS — D509 Iron deficiency anemia, unspecified: Secondary | ICD-10-CM

## 2020-09-24 LAB — IRON AND TIBC
Iron: 203 ug/dL — ABNORMAL HIGH (ref 42–163)
Saturation Ratios: 66 % — ABNORMAL HIGH (ref 20–55)
TIBC: 308 ug/dL (ref 202–409)
UIBC: 105 ug/dL — ABNORMAL LOW (ref 117–376)

## 2020-09-24 LAB — CBC WITH DIFFERENTIAL (CANCER CENTER ONLY)
Abs Immature Granulocytes: 0.04 10*3/uL (ref 0.00–0.07)
Basophils Absolute: 0.1 10*3/uL (ref 0.0–0.1)
Basophils Relative: 1 %
Eosinophils Absolute: 0.4 10*3/uL (ref 0.0–0.5)
Eosinophils Relative: 5 %
HCT: 35 % — ABNORMAL LOW (ref 39.0–52.0)
Hemoglobin: 11.6 g/dL — ABNORMAL LOW (ref 13.0–17.0)
Immature Granulocytes: 1 %
Lymphocytes Relative: 24 %
Lymphs Abs: 1.7 10*3/uL (ref 0.7–4.0)
MCH: 27.9 pg (ref 26.0–34.0)
MCHC: 33.1 g/dL (ref 30.0–36.0)
MCV: 84.1 fL (ref 80.0–100.0)
Monocytes Absolute: 0.7 10*3/uL (ref 0.1–1.0)
Monocytes Relative: 10 %
Neutro Abs: 4.1 10*3/uL (ref 1.7–7.7)
Neutrophils Relative %: 59 %
Platelet Count: 244 10*3/uL (ref 150–400)
RBC: 4.16 MIL/uL — ABNORMAL LOW (ref 4.22–5.81)
RDW: 14.2 % (ref 11.5–15.5)
WBC Count: 7 10*3/uL (ref 4.0–10.5)
nRBC: 0 % (ref 0.0–0.2)

## 2020-09-24 LAB — FERRITIN: Ferritin: 20 ng/mL — ABNORMAL LOW (ref 24–336)

## 2020-09-24 NOTE — Progress Notes (Signed)
[place] [phone]   [phone]  OFFICE PROGRESS NOTE  Aaron Salvage, FNP [place] [id number]  DIAGNOSIS: Microcytic anemia secondary to iron deficiency  PRIOR THERAPY: None  CURRENT THERAPY: Ferrous sulfate 325 mg p.o. daily.  INTERVAL HISTORY: Aaron [name] 75 y.o. male returns to the clinic today for follow-up visit accompanied by his wife.  The patient is feeling fine today with no concerning complaints except for mild fatigue.  He was supposed to receive iron infusion with Venofer but he declined it.  He continued on oral iron tablets with ferrous sulfate 1 tablet p.o. daily and he is tolerating it well.  He denied having any recent bleeding, bruises or ecchymosis.  He has no chest pain but has shortness of breath with exertion with no cough or hemoptysis.  He has no weight loss or night sweats.  He has no nausea, vomiting, diarrhea or constipation.  He is here today for evaluation with repeat CBC, iron study and ferritin.  MEDICAL HISTORY: Past Medical History:  Diagnosis Date  . Anemia    "@ birth"  . Anxiety   . Blood transfusion    "@ birth"  . Cancer ([place])    Head - skin cancer  . Constipation due to pain medication   . Coronary artery disease    DES LAD 2005  . Depression   . Full dentures   . GERD (gastroesophageal reflux disease)   . 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 Heart murmur   . Hyperlipidemia   . Hypertension   . Insomnia   . Osteoarthritis   . PONV (postoperative nausea and vomiting)    2006 - knee surgery  . Pre-diabetes   . Psoriasis   . PTSD (post-traumatic stress disorder)   . Pulmonary embolism ([place]) ~ 03/2006   bilaterally  . Seasonal allergies   . Shortness of breath 06/08/11   "w/exertion; that's why I'm here"  . Sleep apnea, obstructive   . Umbilical hernia     ALLERGIES:  is allergic to wellbutrin [bupropion] and  amoxicillin.  MEDICATIONS:  Current Outpatient Medications  Medication Sig Dispense Refill  . ALPRAZolam (XANAX) 1 MG tablet TAKE 1 TABLET (1 MG TOTAL) BY MOUTH AT BEDTIME AS NEEDED FOR ANXIETY OR SLEEP. 30 tablet 1  . amLODipine (NORVASC) 5 MG tablet TAKE 1 TABLET (5 MG TOTAL) BY MOUTH DAILY. AT BEDTIME 90 tablet 3  . atorvastatin (LIPITOR) 80 MG tablet Take 1 tablet (80 mg total) by mouth daily. 90 tablet 3  . fenofibrate 160 MG tablet TAKE 1 TABLET BY MOUTH  DAILY 90 tablet 3  . ferrous sulfate 325 (65 FE) MG EC tablet TAKE 1 TABLET BY MOUTH EVERY DAY WITH BREAKFAST 90 tablet 3  . pantoprazole (PROTONIX) 40 MG tablet Take 1 tablet (40 mg total) by mouth daily. 90 tablet 3  . telmisartan-hydrochlorothiazide (MICARDIS HCT) 80-25 MG tablet TAKE 1 TABLET BY MOUTH EVERY DAY 90 tablet 1  . traMADol (ULTRAM) 50 MG tablet TAKE 1-2 TABLETS (50-100 MG TOTAL) BY MOUTH EVERY 8 (EIGHT) HOURS AS NEEDED. FOR PAIN 180 tablet 1  . traZODone (DESYREL) 50 MG tablet TAKE 1/2 TO 1 TABLET BY MOUTH AT BEDTIME AS NEEDED FOR SLEEP 90 tablet 1  . venlafaxine XR (EFFEXOR-XR) 75 MG 24 hr capsule TAKE 1 CAPSULE BY MOUTH  DAILY 90 capsule 3  . XARELTO 20 MG  TABS tablet TAKE 1 TABLET BY MOUTH  DAILY WITH SUPPER 90 tablet 3   No current facility-administered medications for this visit.    SURGICAL HISTORY:  Past Surgical History:  Procedure Laterality Date  . CARDIAC CATHETERIZATION    . carpel tunnel release Bilateral ~ 06/2005/~ 08/2005   right/left  . COLONOSCOPY W/ BIOPSIES AND POLYPECTOMY    . CORONARY ANGIOPLASTY    . CORONARY STENT PLACEMENT  02/13/2004   1  . ENUCLEATION Right    age 18  . EYE SURGERY     age 89; "ruptured  right pupil"  . EYE SURGERY     age 73  . EYE SURGERY  1957   placed artificial right eye  . INGUINAL HERNIA REPAIR Right 1983  . INGUINAL HERNIA REPAIR Left 1985  . KNEE ARTHROSCOPY  ~ 06/2005   right  . MULTIPLE TOOTH EXTRACTIONS    . spinal injections  2006-2007   "maybe 10  injections for herniated discs"  . TONSILLECTOMY     "when I was real young"  . TOTAL KNEE ARTHROPLASTY Left 10/09/2015   Procedure: TOTAL KNEE ARTHROPLASTY;  Surgeon: Dorna Leitz, MD;  Location: [facility];  Service: Orthopedics;  Laterality: Left;  . TOTAL KNEE ARTHROPLASTY Right 04/04/2016   Procedure: TOTAL KNEE ARTHROPLASTY;  Surgeon: Dorna Leitz, MD;  Location: [facility];  Service: Orthopedics;  Laterality: Right;    REVIEW OF SYSTEMS:  A comprehensive review of systems was negative except for: Constitutional: positive for fatigue   PHYSICAL EXAMINATION: General appearance: alert, cooperative, fatigued and no distress Head: Normocephalic, without obvious abnormality, atraumatic Neck: no adenopathy, no JVD, supple, symmetrical, trachea midline and thyroid not enlarged, symmetric, no tenderness/mass/nodules Lymph nodes: Cervical, supraclavicular, and axillary nodes normal. Resp: clear to auscultation bilaterally Back: symmetric, no curvature. ROM normal. No CVA tenderness. Cardio: regular rate and rhythm, S1, S2 normal, no murmur, click, rub or gallop GI: soft, non-tender; bowel sounds normal; no masses,  no organomegaly Extremities: extremities normal, atraumatic, no cyanosis or edema  ECOG PERFORMANCE STATUS: 1 - Symptomatic but completely ambulatory  Blood pressure (!) 159/64, pulse 89, temperature 97.8 F (36.6 C), temperature source Tympanic, resp. rate 17, height 5\' 6"  (1.676 m), weight 258 lb 8 oz (117.3 kg), SpO2 (!) 17 %.  LABORATORY DATA: Lab Results  Component Value Date   WBC 7.0 09/24/2020   HGB 11.6 (L) 09/24/2020   HCT 35.0 (L) 09/24/2020   MCV 84.1 09/24/2020   PLT 244 09/24/2020      Chemistry      Component Value Date/Time   NA 139 06/15/2020 1306   K 4.2 06/15/2020 1306   CL 106 06/15/2020 1306   CO2 25 06/15/2020 1306   BUN 21 06/15/2020 1306   CREATININE 1.24 06/15/2020 1306   CREATININE 1.16 02/10/2020 1532      Component Value Date/Time   CALCIUM 9.0  06/15/2020 1306   ALKPHOS 70 06/15/2020 1306   AST 17 06/15/2020 1306   ALT 12 06/15/2020 1306   BILITOT 0.4 06/15/2020 1306       RADIOGRAPHIC STUDIES: No results found.  ASSESSMENT AND PLAN: This is a very pleasant 75 years old white male with history of iron deficiency anemia secondary to chronic gastrointestinal blood loss. The patient is currently on oral iron tablet with ferrous sulfate 325 mg p.o. daily and tolerating this treatment well. CBC today showed improvement of his hemoglobin and hematocrit but he continues to have mild anemia.  He also has improvement  in the serum iron and ferritin level but not completely to the normal level yet. I recommended for the patient to continue on the oral iron tablet for now. I strongly recommend for him to see his gastroenterologist for consideration of repeat colonoscopy. I will see him back for follow-up visit in 3 months for evaluation with repeat CBC, iron study and ferritin. He was advised to call immediately if he has any concerning symptoms in the interval. The patient voices understanding of current disease status and treatment options and is in agreement with the current care plan.  All questions were answered. The patient knows to call the clinic with any problems, questions or concerns. We can certainly see the patient much sooner if necessary.  The total time spent in the appointment was 20 minutes.  Disclaimer: This note was dictated with voice recognition software. Similar sounding words can inadvertently be transcribed and may not be corrected upon review.

## 2020-09-24 NOTE — Telephone Encounter (Signed)
Scheduled follow-up appointment per 3/24 los. Patient is aware. 

## 2020-10-07 DIAGNOSIS — K5730 Diverticulosis of large intestine without perforation or abscess without bleeding: Secondary | ICD-10-CM | POA: Insufficient documentation

## 2020-10-07 DIAGNOSIS — R141 Gas pain: Secondary | ICD-10-CM | POA: Insufficient documentation

## 2020-10-07 DIAGNOSIS — K219 Gastro-esophageal reflux disease without esophagitis: Secondary | ICD-10-CM | POA: Insufficient documentation

## 2020-10-07 DIAGNOSIS — R143 Flatulence: Secondary | ICD-10-CM | POA: Insufficient documentation

## 2020-10-07 DIAGNOSIS — Z86010 Personal history of colonic polyps: Secondary | ICD-10-CM | POA: Insufficient documentation

## 2020-10-07 DIAGNOSIS — K5900 Constipation, unspecified: Secondary | ICD-10-CM | POA: Insufficient documentation

## 2020-10-07 DIAGNOSIS — R142 Eructation: Secondary | ICD-10-CM | POA: Insufficient documentation

## 2020-10-08 ENCOUNTER — Other Ambulatory Visit: Payer: Self-pay | Admitting: Family

## 2020-10-12 ENCOUNTER — Encounter: Payer: Self-pay | Admitting: Family

## 2020-10-12 ENCOUNTER — Ambulatory Visit (INDEPENDENT_AMBULATORY_CARE_PROVIDER_SITE_OTHER): Payer: Medicare Other

## 2020-10-12 ENCOUNTER — Ambulatory Visit (INDEPENDENT_AMBULATORY_CARE_PROVIDER_SITE_OTHER): Payer: Medicare Other | Admitting: Family

## 2020-10-12 ENCOUNTER — Other Ambulatory Visit: Payer: Self-pay

## 2020-10-12 VITALS — BP 150/70 | HR 95 | Temp 98.4°F | Ht 66.0 in | Wt 255.4 lb

## 2020-10-12 VITALS — BP 170/80 | HR 95 | Temp 98.4°F | Ht 66.0 in | Wt 255.4 lb

## 2020-10-12 DIAGNOSIS — R053 Chronic cough: Secondary | ICD-10-CM

## 2020-10-12 DIAGNOSIS — Z0000 Encounter for general adult medical examination without abnormal findings: Secondary | ICD-10-CM | POA: Diagnosis not present

## 2020-10-12 DIAGNOSIS — R059 Cough, unspecified: Secondary | ICD-10-CM | POA: Diagnosis not present

## 2020-10-12 DIAGNOSIS — I10 Essential (primary) hypertension: Secondary | ICD-10-CM | POA: Diagnosis not present

## 2020-10-12 DIAGNOSIS — R946 Abnormal results of thyroid function studies: Secondary | ICD-10-CM | POA: Diagnosis not present

## 2020-10-12 DIAGNOSIS — E7800 Pure hypercholesterolemia, unspecified: Secondary | ICD-10-CM

## 2020-10-12 NOTE — Progress Notes (Signed)
Aaron [name] is a 75 y.o. male with the following history as recorded in EpicCare:  Patient Active Problem List   Diagnosis Date Noted  . History of colonic polyps 10/07/2020  . Flatulence, eructation and gas pain 10/07/2020  . Diverticular disease of colon 10/07/2020  . Constipation 10/07/2020  . Gastroesophageal reflux disease 10/07/2020  . Iron deficiency anemia 06/15/2020  . History of DVT (deep vein thrombosis) 05/06/2019  . History of pulmonary embolus (PE) 05/06/2019  . Pre-diabetes 05/18/2018  . Chronic low back pain 11/26/2017  . Status post total knee replacement 08/15/2016  . Morbid obesity ([place]) 08/01/2016  . Primary osteoarthritis of right knee 04/04/2016  . DVT (deep venous thrombosis) ([place]) 11/09/2015  . Primary osteoarthritis of left knee 10/09/2015  . Generalized anxiety disorder 08/20/2015  . Routine general medical examination at a health care facility 02/11/2015  . Medicare annual wellness visit, subsequent 02/11/2015  . Long term current use of anticoagulant 06/14/2011  . Coronary artery disease   . Hypertension   . Hyperlipidemia   . Depression   . GERD (gastroesophageal reflux disease)   . Insomnia   . Sleep apnea, obstructive   . Pulmonary embolism ([place])     Current Outpatient Medications  Medication Sig Dispense Refill  . ALPRAZolam (XANAX) 1 MG tablet TAKE 1 TABLET (1 MG TOTAL) BY MOUTH AT BEDTIME AS NEEDED FOR ANXIETY OR SLEEP. 30 tablet 1  . amLODipine (NORVASC) 5 MG tablet TAKE 1 TABLET (5 MG TOTAL) BY MOUTH DAILY. AT BEDTIME 90 tablet 3  . atorvastatin (LIPITOR) 80 MG tablet Take 1 tablet (80 mg total) by mouth daily. 90 tablet 3  . ferrous sulfate 325 (65 FE) MG EC tablet TAKE 1 TABLET BY MOUTH EVERY DAY WITH BREAKFAST 90 tablet 3  . pantoprazole (PROTONIX) 40 MG tablet Take 1 tablet (40 mg total) by mouth daily. 90 tablet 3  . telmisartan-hydrochlorothiazide (MICARDIS HCT) 80-25 MG tablet TAKE 1 TABLET BY MOUTH EVERY DAY 90 tablet 1  .  traMADol (ULTRAM) 50 MG tablet TAKE 1-2 TABLETS (50-100 MG TOTAL) BY MOUTH EVERY 8 (EIGHT) HOURS AS NEEDED. FOR PAIN 180 tablet 1  . traZODone (DESYREL) 50 MG tablet TAKE 1/2 TO 1 TABLET BY MOUTH AT BEDTIME AS NEEDED FOR SLEEP 90 tablet 1  . venlafaxine XR (EFFEXOR-XR) 75 MG 24 hr capsule TAKE 1 CAPSULE BY MOUTH  DAILY 90 capsule 3  . XARELTO 20 MG TABS tablet TAKE 1 TABLET BY MOUTH  DAILY WITH SUPPER 90 tablet 3   No current facility-administered medications for this visit.    Allergies: Wellbutrin [bupropion] and Amoxicillin  Past Medical History:  Diagnosis Date  . Anemia    "@ birth"  . Anxiety   . Blood transfusion    "@ birth"  . Cancer ([place])    Head - skin cancer  . Constipation due to pain medication   . Coronary artery disease    DES LAD 2005  . Depression   . Full dentures   . GERD (gastroesophageal reflux disease)   . 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 Heart murmur   . Hyperlipidemia   . Hypertension   . Insomnia   . Osteoarthritis   . PONV (postoperative nausea and vomiting)    2006 - knee surgery  . Pre-diabetes   . Psoriasis   . PTSD (post-traumatic stress disorder)   . Pulmonary embolism ([place]) ~ 03/2006   bilaterally  . Seasonal  allergies   . Shortness of breath 06/08/11   "w/exertion; that's why I'm here"  . Sleep apnea, obstructive   . Umbilical hernia     Past Surgical History:  Procedure Laterality Date  . CARDIAC CATHETERIZATION    . carpel tunnel release Bilateral ~ 06/2005/~ 08/2005   right/left  . COLONOSCOPY W/ BIOPSIES AND POLYPECTOMY    . CORONARY ANGIOPLASTY    . CORONARY STENT PLACEMENT  02/13/2004   1  . ENUCLEATION Right    age 32  . EYE SURGERY     age 27; "ruptured  right pupil"  . EYE SURGERY     age 24  . EYE SURGERY  1957   placed artificial right eye  . INGUINAL HERNIA REPAIR Right 1983  . INGUINAL HERNIA REPAIR Left 1985  . KNEE ARTHROSCOPY  ~ 06/2005   right  . MULTIPLE TOOTH EXTRACTIONS     . spinal injections  2006-2007   "maybe 10 injections for herniated discs"  . TONSILLECTOMY     "when I was real young"  . TOTAL KNEE ARTHROPLASTY Left 10/09/2015   Procedure: TOTAL KNEE ARTHROPLASTY;  Surgeon: Dorna Leitz, MD;  Location: [facility];  Service: Orthopedics;  Laterality: Left;  . TOTAL KNEE ARTHROPLASTY Right 04/04/2016   Procedure: TOTAL KNEE ARTHROPLASTY;  Surgeon: Dorna Leitz, MD;  Location: [facility];  Service: Orthopedics;  Laterality: Right;    Family History  Problem Relation Age of Onset  . Asthma Father   . Emphysema Father   . Breast cancer Sister   . Liver cancer Sister   . Asthma Sister   . Asthma Brother     Social History   Tobacco Use  . Smoking status: Former Smoker    Packs/day: 1.50    Years: 25.00    Pack years: 37.50    Types: Cigarettes    Quit date: 07/10/1995    Years since quitting: 25.2  . Smokeless tobacco: Never Used  Substance Use Topics  . Alcohol use: No    Subjective:   Follow up on hypertension/ hyperlipidemia;  1) Has recently been asked to stop Fenofibrate; in reviewing notes, he was also asked to go to lipid clinic through cardiology in January 2022; he notes that he cancelled the appointment in January due to [place] concerns; agreeable to having appointment re-scheduled;  2) Notes that blood pressure is typically elevated at doctor's office visits; had stressful run in with neighbor prior to appointment; patient does not check his blood pressure regularly at home; Denies any chest pain, shortness of breath, blurred vision or headache  3) Still working with hematology for iron deficiency anemia- has opted against infusion at this time; taking oral iron supplement and planning to follow-up in June 2022; still has not seen GI as recommended regarding anemia either;      Objective:  Vitals:   10/12/20 1545 10/12/20 1614  BP: (!) 174/70 (!) 150/70  Pulse: 95   Temp: 98.4 F (36.9 C)   TempSrc: Oral   SpO2: 94%   Weight: 255 lb 6.4  oz (115.8 kg)   Height: 5\' 6"  (1.676 m)     General: Well developed, well nourished, in no acute distress  Skin : Warm and dry.  Head: Normocephalic and atraumatic  Eyes: Sclera and conjunctiva clear; pupils round and reactive to light; extraocular movements intact  Ears: External normal; canals clear; tympanic membranes normal  Oropharynx: Pink, supple. No suspicious lesions  Neck: Supple without thyromegaly, adenopathy  Lungs: Respirations  unlabored; clear to auscultation bilaterally without wheeze, rales, rhonchi  CVS exam: normal rate and regular rhythm.  Neurologic: Alert and oriented; speech intact; face symmetrical; moves all extremities well; CNII-XII intact without focal deficit    Assessment:  1. Pure hypercholesterolemia   2. Essential hypertension   3. Nonspecific abnormal results of function study of thyroid   4. Chronic cough     Plan:  1. Refer back to lipid clinic as previously recommended by his cardiologist; will hold Fenofibrate for now; 2. ? White coat reaction; again stressed need to get home cuff and check regularly; pressure had come down some during course of OV today; 3. Did not follow-up last August as recommended after ultrasound- check TSH today; 4. Update CXR and D-dimer; may need to refer back to ENT due to history of chronic sinus issues;  This visit occurred during the SARS-CoV-2 public health emergency.  Safety protocols were in place, including screening questions prior to the visit, additional usage of staff PPE, and extensive cleaning of exam room while observing appropriate contact time as indicated for disinfecting solutions.      Return in about 2 months (around 12/12/2020) for [facility] with Dr. Ronnald Ramp .  Orders Placed This Encounter  Procedures  . DG Chest 2 View    Standing Status:   Future    Number of Occurrences:   1    Standing Expiration Date:   10/12/2021    Order Specific Question:   Reason for Exam (SYMPTOM  OR DIAGNOSIS REQUIRED)     Answer:   chronic cough    Order Specific Question:   Preferred imaging location?    Answer:   [facility]  . TSH    Standing Status:   Future    Number of Occurrences:   1    Standing Expiration Date:   10/12/2021  . D-Dimer, Quantitative    Standing Status:   Future    Number of Occurrences:   1    Standing Expiration Date:   10/12/2021  . AMB Referral to [facility]    Referral Priority:   Routine    Referral Type:   Consultation    Referral Reason:   Specialty Services Required    Number of Visits Requested:   1    Requested Prescriptions    No prescriptions requested or ordered in this encounter

## 2020-10-12 NOTE — Patient Instructions (Signed)
Aaron [name] , Thank you for taking time to come for your Medicare Wellness Visit. I appreciate your ongoing commitment to your health goals. Please review the following plan we discussed and let me know if I can assist you in the future.   Screening recommendations/referrals: Colonoscopy: last done 02/01/2006; due every 10 years; please call Dr. Lorane Gell office to schedule  Recommended yearly ophthalmology/optometry visit for glaucoma screening and checkup Recommended yearly dental visit for hygiene and checkup  Vaccinations: Influenza vaccine: 03/29/2020 Pneumococcal vaccine: 06/09/2011, 02/11/2015 Tdap vaccine: 02/11/2015; due every 10 years Shingles vaccine: never done; can check with local pharmacy   Covid-19: 08/24/2019, 09/17/2019, 03/29/2020  Advanced directives: Advance directive discussed with you today. Even though you declined this today please call our office should you change your mind and we can give you the proper paperwork for you to fill out.  Conditions/risks identified: Yes; Reviewed health maintenance screenings with patient today and relevant education, vaccines, and/or referrals were provided. Please continue to do your personal lifestyle choices by: daily care of teeth and gums, regular physical activity (goal should be 5 days a week for 30 minutes), eat a healthy diet, avoid tobacco and drug use, limiting any alcohol intake, taking a low-dose aspirin (if not allergic or have been advised by your provider otherwise) and taking vitamins and minerals as recommended by your provider. Continue doing brain stimulating activities (puzzles, reading, adult coloring books, staying active) to keep memory sharp. Continue to eat heart healthy diet (full of fruits, vegetables, whole grains, lean protein, water--limit salt, fat, and sugar intake) and increase physical activity as tolerated.  Next appointment: Please schedule your next Medicare Wellness Visit with your Nurse Health Advisor in 1  year by calling [phone].  Preventive Care 40-64 Years, Male Preventive care refers to lifestyle choices and visits with your health care provider that can promote health and wellness. What does preventive care include?  A yearly physical exam. This is also called an annual well check.  Dental exams once or twice a year.  Routine eye exams. Ask your health care provider how often you should have your eyes checked.  Personal lifestyle choices, including:  Daily care of your teeth and gums.  Regular physical activity.  Eating a healthy diet.  Avoiding tobacco and drug use.  Limiting alcohol use.  Practicing safe sex.  Taking low-dose aspirin every day starting at age 80. What happens during an annual well check? The services and screenings done by your health care provider during your annual well check will depend on your age, overall health, lifestyle risk factors, and family history of disease. Counseling  Your health care provider may ask you questions about your:  Alcohol use.  Tobacco use.  Drug use.  Emotional well-being.  Home and relationship well-being.  Sexual activity.  Eating habits.  Work and work Statistician. Screening  You may have the following tests or measurements:  Height, weight, and BMI.  Blood pressure.  Lipid and cholesterol levels. These may be checked every 5 years, or more frequently if you are over 23 years old.  Skin check.  Lung cancer screening. You may have this screening every year starting at age 22 if you have a 30-pack-year history of smoking and currently smoke or have quit within the past 15 years.  Fecal occult blood test (FOBT) of the stool. You may have this test every year starting at age 64.  Flexible sigmoidoscopy or colonoscopy. You may have a sigmoidoscopy every 5 years or  a colonoscopy every 10 years starting at age 64.  Prostate cancer screening. Recommendations will vary depending on your family history  and other risks.  Hepatitis C blood test.  Hepatitis B blood test.  Sexually transmitted disease (STD) testing.  Diabetes screening. This is done by checking your blood sugar (glucose) after you have not eaten for a while (fasting). You may have this done every 1-3 years. Discuss your test results, treatment options, and if necessary, the need for more tests with your health care provider. Vaccines  Your health care provider may recommend certain vaccines, such as:  Influenza vaccine. This is recommended every year.  Tetanus, diphtheria, and acellular pertussis (Tdap, Td) vaccine. You may need a Td booster every 10 years.  Zoster vaccine. You may need this after age 55.  Pneumococcal 13-valent conjugate (PCV13) vaccine. You may need this if you have certain conditions and have not been vaccinated.  Pneumococcal polysaccharide (PPSV23) vaccine. You may need one or two doses if you smoke cigarettes or if you have certain conditions. Talk to your health care provider about which screenings and vaccines you need and how often you need them. This information is not intended to replace advice given to you by your health care provider. Make sure you discuss any questions you have with your health care provider. Document Released: 07/17/2015 Document Revised: 03/09/2016 Document Reviewed: 04/21/2015 Elsevier Interactive Patient Education  2017 [place] Prevention in the Home Falls can cause injuries. They can happen to people of all ages. There are many things you can do to make your home safe and to help prevent falls. What can I do on the outside of my home?  Regularly fix the edges of walkways and driveways and fix any cracks.  Remove anything that might make you trip as you walk through a door, such as a raised step or threshold.  Trim any bushes or trees on the path to your home.  Use bright outdoor lighting.  Clear any walking paths of anything that might make someone  trip, such as rocks or tools.  Regularly check to see if handrails are loose or broken. Make sure that both sides of any steps have handrails.  Any raised decks and porches should have guardrails on the edges.  Have any leaves, snow, or ice cleared regularly.  Use sand or salt on walking paths during winter.  Clean up any spills in your garage right away. This includes oil or grease spills. What can I do in the bathroom?  Use night lights.  Install grab bars by the toilet and in the tub and shower. Do not use towel bars as grab bars.  Use non-skid mats or decals in the tub or shower.  If you need to sit down in the shower, use a plastic, non-slip stool.  Keep the floor dry. Clean up any water that spills on the floor as soon as it happens.  Remove soap buildup in the tub or shower regularly.  Attach bath mats securely with double-sided non-slip rug tape.  Do not have throw rugs and other things on the floor that can make you trip. What can I do in the bedroom?  Use night lights.  Make sure that you have a light by your bed that is easy to reach.  Do not use any sheets or blankets that are too big for your bed. They should not hang down onto the floor.  Have a firm chair that has side arms. You  can use this for support while you get dressed.  Do not have throw rugs and other things on the floor that can make you trip. What can I do in the kitchen?  Clean up any spills right away.  Avoid walking on wet floors.  Keep items that you use a lot in easy-to-reach places.  If you need to reach something above you, use a strong step stool that has a grab bar.  Keep electrical cords out of the way.  Do not use floor polish or wax that makes floors slippery. If you must use wax, use non-skid floor wax.  Do not have throw rugs and other things on the floor that can make you trip. What can I do with my stairs?  Do not leave any items on the stairs.  Make sure that there  are handrails on both sides of the stairs and use them. Fix handrails that are broken or loose. Make sure that handrails are as long as the stairways.  Check any carpeting to make sure that it is firmly attached to the stairs. Fix any carpet that is loose or worn.  Avoid having throw rugs at the top or bottom of the stairs. If you do have throw rugs, attach them to the floor with carpet tape.  Make sure that you have a light switch at the top of the stairs and the bottom of the stairs. If you do not have them, ask someone to add them for you. What else can I do to help prevent falls?  Wear shoes that:  Do not have high heels.  Have rubber bottoms.  Are comfortable and fit you well.  Are closed at the toe. Do not wear sandals.  If you use a stepladder:  Make sure that it is fully opened. Do not climb a closed stepladder.  Make sure that both sides of the stepladder are locked into place.  Ask someone to hold it for you, if possible.  Clearly mark and make sure that you can see:  Any grab bars or handrails.  First and last steps.  Where the edge of each step is.  Use tools that help you move around (mobility aids) if they are needed. These include:  Canes.  Walkers.  Scooters.  Crutches.  Turn on the lights when you go into a dark area. Replace any light bulbs as soon as they burn out.  Set up your furniture so you have a clear path. Avoid moving your furniture around.  If any of your floors are uneven, fix them.  If there are any pets around you, be aware of where they are.  Review your medicines with your doctor. Some medicines can make you feel dizzy. This can increase your chance of falling. Ask your doctor what other things that you can do to help prevent falls. This information is not intended to replace advice given to you by your health care provider. Make sure you discuss any questions you have with your health care provider. Document Released:  04/16/2009 Document Revised: 11/26/2015 Document Reviewed: 07/25/2014 Elsevier Interactive Patient Education  2017 [employer].

## 2020-10-12 NOTE — Progress Notes (Addendum)
Subjective:   Aaron [name] is a 75 y.o. male who presents for Medicare Annual/Subsequent preventive examination.  Review of Systems    No ROS. Medicare Wellness Visit. Additional risk factors are reflected in social history. Cardiac Risk Factors include: advanced age (>49men, >78 women);dyslipidemia;hypertension;male gender;obesity (BMI >30kg/m2)     Objective:    Today's Vitals   10/12/20 1449  BP: (!) 170/80  Pulse: 95  Temp: 98.4 F (36.9 C)  SpO2: 94%  Weight: 255 lb 6.4 oz (115.8 kg)  Height: 5\' 6"  (1.676 m)   Body mass index is 41.22 kg/m.  Advanced Directives 10/12/2020 06/15/2020 03/25/2016 09/25/2015 06/08/2011  Does Patient Have a Medical Advance Directive? No No No No Patient does not have advance directive;Patient would not like information  Would patient like information on creating a medical advance directive? No - Patient declined Yes (MAU/Ambulatory/Procedural Areas - Information given) Yes - Educational materials given No - patient declined information -    Current Medications (verified) Outpatient Encounter Medications as of 10/12/2020  Medication Sig   ALPRAZolam (XANAX) 1 MG tablet TAKE 1 TABLET (1 MG TOTAL) BY MOUTH AT BEDTIME AS NEEDED FOR ANXIETY OR SLEEP.   amLODipine (NORVASC) 5 MG tablet TAKE 1 TABLET (5 MG TOTAL) BY MOUTH DAILY. AT BEDTIME   atorvastatin (LIPITOR) 80 MG tablet Take 1 tablet (80 mg total) by mouth daily.   ferrous sulfate 325 (65 FE) MG EC tablet TAKE 1 TABLET BY MOUTH EVERY DAY WITH BREAKFAST   pantoprazole (PROTONIX) 40 MG tablet Take 1 tablet (40 mg total) by mouth daily.   telmisartan-hydrochlorothiazide (MICARDIS HCT) 80-25 MG tablet TAKE 1 TABLET BY MOUTH EVERY DAY   traMADol (ULTRAM) 50 MG tablet TAKE 1-2 TABLETS (50-100 MG TOTAL) BY MOUTH EVERY 8 (EIGHT) HOURS AS NEEDED. FOR PAIN   traZODone (DESYREL) 50 MG tablet TAKE 1/2 TO 1 TABLET BY MOUTH AT BEDTIME AS NEEDED FOR SLEEP   venlafaxine XR (EFFEXOR-XR) 75 MG 24 hr capsule  TAKE 1 CAPSULE BY MOUTH  DAILY   XARELTO 20 MG TABS tablet TAKE 1 TABLET BY MOUTH  DAILY WITH SUPPER   [DISCONTINUED] fenofibrate 160 MG tablet TAKE 1 TABLET BY MOUTH  DAILY   No facility-administered encounter medications on file as of 10/12/2020.    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"   Heart murmur    Hyperlipidemia    Hypertension    Insomnia    Osteoarthritis    PONV (postoperative nausea and vomiting)    2006 - knee surgery   Pre-diabetes    Psoriasis    PTSD (post-traumatic stress disorder)    Pulmonary embolism ([place]) ~ 03/2006   bilaterally   Seasonal allergies    Shortness of breath 06/08/11   "w/exertion; that's why I'm here"   Sleep apnea, obstructive    Umbilical hernia    Past Surgical History:  Procedure Laterality Date   CARDIAC CATHETERIZATION     carpel tunnel release Bilateral ~  06/2005/~ 08/2005   right/left   COLONOSCOPY W/ BIOPSIES AND POLYPECTOMY     CORONARY ANGIOPLASTY     CORONARY STENT PLACEMENT  02/13/2004   1   ENUCLEATION Right    age 19   EYE SURGERY     age 60; "ruptured  right pupil"   EYE SURGERY     age 61   [place]   placed artificial right eye   INGUINAL HERNIA REPAIR Right 1983   INGUINAL HERNIA REPAIR Left 1985   KNEE ARTHROSCOPY  ~ 06/2005   right   MULTIPLE TOOTH EXTRACTIONS     spinal injections  2006-2007   "maybe 10 injections for herniated discs"   TONSILLECTOMY     "when I was real young"   TOTAL KNEE ARTHROPLASTY Left 10/09/2015   Procedure: TOTAL KNEE ARTHROPLASTY;  Surgeon: Dorna Leitz, MD;  Location: [facility];  Service: Orthopedics;  Laterality: Left;   TOTAL KNEE ARTHROPLASTY Right 04/04/2016   Procedure: TOTAL KNEE ARTHROPLASTY;  Surgeon: Dorna Leitz, MD;  Location: [facility];  Service: Orthopedics;  Laterality: Right;   Family History  Problem Relation Age of Onset   Asthma Father    Emphysema Father    Breast cancer Sister    Liver cancer Sister    Asthma Sister    Asthma Brother    Social History   Socioeconomic History   Marital status: Married    Spouse name: Not on file   Number of children: 3   Years of education: Not on file   Highest education level: Not on file  Occupational History   Occupation: building maintenance    Comment: disabled  Tobacco Use   Smoking status: Former Smoker    Packs/day: 1.50    Years: 25.00    Pack years: 37.50    Types: Cigarettes    Quit date: 07/10/1995    Years since quitting: 25.2   Smokeless tobacco: Never Used  Vaping Use   Vaping Use: Never used  Substance and Sexual Activity   Alcohol use: No   Drug use: No   Sexual activity: Not on file  Other Topics Concern   Not on file  Social History Narrative   Former Curator, 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   Recent stress, currently undergoing financial difficulties.   Social Determinants of Health   Financial Resource Strain: Low Risk    Difficulty of Paying Living Expenses: Not hard at all  Food Insecurity: No Food Insecurity   Worried About Charity fundraiser in the Last Year: Never true   [place] in the Last Year: Never true  Transportation Needs: No Transportation Needs   Lack of Transportation (Medical): No   Lack of Transportation (Non-Medical): No  Physical Activity: Inactive   Days of Exercise per Week: 0 days   Minutes of Exercise per Session: 0 min  Stress: No Stress Concern Present   Feeling of Stress : Not at all  Social Connections: Socially Integrated   Frequency of Communication with Friends and Family: More than three times a week    Frequency of Social Gatherings with Friends and Family: Once a week   Attends Religious Services: 1 to 4 times per year   Active Member of [employer] or Organizations: No   Attends Archivist Meetings: 1 to 4 times per year   Marital Status: Married    Tobacco Counseling Counseling given: Not Answered   Clinical Intake:  Pre-visit preparation completed: Yes  Pain : No/denies pain     BMI - recorded: 41.22 Nutritional Status: BMI > 30  Obese Nutritional Risks: None Diabetes: No  How often do you need to have someone help you when you read instructions, pamphlets, or other written materials from your doctor or pharmacy?: 1 - Never What is the last grade level you completed in school?: GED  Diabetic? no  Interpreter Needed?: No  Information entered by :: Lisette Abu, LPN   Activities of Daily Living In your present state of health, do you have any difficulty performing the following activities: 10/12/2020  Hearing? Y  Vision? Y  Difficulty concentrating or making decisions? N  Walking or climbing stairs? Y  Dressing or bathing? N  Doing errands, shopping? N  Preparing Food and eating ? N  Using the Toilet? N  In the past six months, have you accidently leaked urine? N  Do you have problems with loss of bowel control? N  Managing your Medications? N  Managing your Finances? N  Housekeeping or managing your Housekeeping? N  Some recent data might be hidden    Patient Care Team: [name], FNP as PCP - General (Internal Medicine) [place], Peter M, MD as PCP - Cardiology (Cardiology) Juanita Craver, MD as Consulting Physician (Gastroenterology)  Indicate any recent Medical Services you may have received from other than [facility] providers in the past year (date may be approximate).     Assessment:   This is a routine wellness examination for [facility].  Hearing/Vision screen No exam data present  Dietary issues and exercise activities  discussed: Current Exercise Habits: The patient does not participate in regular exercise at present, Exercise limited by: cardiac condition(s);respiratory conditions(s);psychological condition(s);orthopedic condition(s)  Goals   None    Depression Screen PHQ 2/9 Scores 10/12/2020 05/18/2018 04/20/2017  PHQ - 2 Score 0 0 2  PHQ- 9 Score - 5 7    Fall Risk Fall Risk  10/12/2020 02/10/2020 05/18/2018 04/20/2017  Falls in the past year? 0 0 0 No  Number falls in past yr: 0 0 1 -  Injury with Fall? 0 0 0 -  Risk for fall due to : Other (Comment) No Fall Risks - -  Follow up Falls evaluation completed - - -    FALL RISK PREVENTION PERTAINING TO THE HOME:  Any stairs in or around the home? No  If so, are there any without handrails? No  Home free of loose throw rugs in walkways, pet beds, electrical cords, etc? Yes  Adequate lighting in your home to reduce risk of falls? Yes   ASSISTIVE DEVICES UTILIZED TO PREVENT FALLS:  Life alert? No  Use of a cane, walker or w/c? Yes  Grab bars in the bathroom? Yes  Shower chair or bench in shower? No  Elevated toilet seat or a handicapped toilet? Yes   TIMED UP AND GO:  Was the test performed? No .  Length of time to ambulate 10 feet: 0 sec.   Gait steady and fast without use of assistive device  Cognitive Function: Normal cognitive status assessed by direct observation by this Nurse Health Advisor. No abnormalities found.          Immunizations Immunization History  Administered Date(s) Administered   Fluad Quad(high Dose 65+) 03/29/2020   Influenza Split 05/05/2011, 04/03/2013   Influenza Whole 04/30/2012   Influenza, High Dose Seasonal PF 08/10/2015, 03/08/2016, 04/20/2017, 04/24/2018, 03/26/2019   Influenza-Unspecified 03/26/2019   PFIZER(Purple Top)SARS-COV-2 Vaccination  08/24/2019, 09/17/2019, 03/29/2020   Pneumococcal Conjugate-13 02/11/2015   Pneumococcal Polysaccharide-23 06/09/2011   Td 02/11/2015    TDAP status: Up  to date  Flu Vaccine status: Up to date  Pneumococcal vaccine status: Up to date  Covid-19 vaccine status: Information provided on how to obtain vaccines.   Qualifies for Shingles Vaccine? Yes   Zostavax completed No   Shingrix Completed?: No.    Education has been provided regarding the importance of this vaccine. Patient has been advised to call insurance company to determine out of pocket expense if they have not yet received this vaccine. Advised may also receive vaccine at local pharmacy or Health Dept. Verbalized acceptance and understanding.  Screening Tests Health Maintenance  Topic Date Due   COLONOSCOPY (Pts 45-19yrs Insurance coverage will need to be confirmed)  02/02/2016   INFLUENZA VACCINE  02/01/2021   TETANUS/TDAP  02/10/2025   COVID-19 Vaccine  Completed   Hepatitis C Screening  Completed   PNA vac Low Risk Adult  Completed   HPV VACCINES  Aged Out    Health Maintenance  Health Maintenance Due  Topic Date Due   COLONOSCOPY (Pts 45-41yrs Insurance coverage will need to be confirmed)  02/02/2016    Colorectal cancer screening: Type of screening: Colonoscopy. Completed 2012. Repeat every 5 years (per Dr. Mar Daring Mann's office)  Lung Cancer Screening: (Low Dose CT Chest recommended if Age 4-80 years, 30 pack-year currently smoking OR have quit w/in 15years.) does qualify.   Lung Cancer Screening Referral: no  Additional Screening:  Hepatitis C Screening: does not qualify; Completed no  Vision Screening: Recommended annual ophthalmology exams for early detection of glaucoma and other disorders of the eye. Is the patient up to date with their annual eye exam?  Yes  Who is the provider or what is the name of the office in which the patient attends annual eye exams? Warden Fillers, MD. If pt is not established with a provider, would they like to be referred to a provider to establish care? No .   Dental Screening: Recommended annual dental exams for proper  oral hygiene  Community Resource Referral / Chronic Care Management: CRR required this visit?  No   CCM required this visit?  No      Plan:     I have personally reviewed and noted the following in the patient's chart:   Medical and social history Use of alcohol, tobacco or illicit drugs  Current medications and supplements Functional ability and status Nutritional status Physical activity Advanced directives List of other physicians Hospitalizations, surgeries, and ER visits in previous 12 months Vitals Screenings to include cognitive, depression, and falls Referrals and appointments  In addition, I have reviewed and discussed with patient certain preventive protocols, quality metrics, and best practice recommendations. A written personalized care plan for preventive services as well as general preventive health recommendations were provided to patient.     Sheral Flow, LPN   07/31/7865   Nurse Notes:  Medications reviewed with patient; no opioid use noted.  Medical screening examination/treatment/procedure(s) were performed by non-physician practitioner and as supervising provider I was immediately available for consultation/collaboration.  I agree with above. [name], FNP

## 2020-10-13 DIAGNOSIS — G4733 Obstructive sleep apnea (adult) (pediatric): Secondary | ICD-10-CM | POA: Diagnosis not present

## 2020-10-13 LAB — D-DIMER, QUANTITATIVE: D-Dimer, Quant: 0.48 mcg/mL FEU (ref <0.50)

## 2020-10-13 LAB — TSH: TSH: 2.81 u[IU]/mL (ref 0.35–4.50)

## 2020-10-14 ENCOUNTER — Other Ambulatory Visit: Payer: Self-pay | Admitting: Family

## 2020-10-14 DIAGNOSIS — R059 Cough, unspecified: Secondary | ICD-10-CM

## 2020-10-20 DIAGNOSIS — M79675 Pain in left toe(s): Secondary | ICD-10-CM | POA: Diagnosis not present

## 2020-10-20 DIAGNOSIS — L603 Nail dystrophy: Secondary | ICD-10-CM | POA: Diagnosis not present

## 2020-10-20 DIAGNOSIS — M79674 Pain in right toe(s): Secondary | ICD-10-CM | POA: Diagnosis not present

## 2020-10-20 DIAGNOSIS — L600 Ingrowing nail: Secondary | ICD-10-CM | POA: Diagnosis not present

## 2020-10-20 DIAGNOSIS — I739 Peripheral vascular disease, unspecified: Secondary | ICD-10-CM | POA: Diagnosis not present

## 2020-10-20 DIAGNOSIS — B351 Tinea unguium: Secondary | ICD-10-CM | POA: Diagnosis not present

## 2020-10-24 ENCOUNTER — Ambulatory Visit
Admission: RE | Admit: 2020-10-24 | Discharge: 2020-10-24 | Disposition: A | Discharge status: Home / Self Care | Payer: Medicare Other | Source: Ambulatory Visit | Attending: Family | Admitting: Family

## 2020-10-24 DIAGNOSIS — R918 Other nonspecific abnormal finding of lung field: Secondary | ICD-10-CM | POA: Diagnosis not present

## 2020-10-24 DIAGNOSIS — R059 Cough, unspecified: Secondary | ICD-10-CM

## 2020-10-29 ENCOUNTER — Telehealth: Payer: Self-pay

## 2020-10-29 DIAGNOSIS — E7800 Pure hypercholesterolemia, unspecified: Secondary | ICD-10-CM

## 2020-10-29 NOTE — Telephone Encounter (Signed)
Called and lmomed the pt that they will need to complete fasting lipids prior to their appt

## 2020-11-13 ENCOUNTER — Ambulatory Visit: Payer: Medicare Other

## 2020-11-15 ENCOUNTER — Other Ambulatory Visit: Payer: Self-pay | Admitting: Family

## 2020-11-25 ENCOUNTER — Encounter: Payer: Self-pay | Admitting: *Deleted

## 2020-12-10 ENCOUNTER — Other Ambulatory Visit: Payer: Self-pay | Admitting: Family

## 2020-12-17 ENCOUNTER — Other Ambulatory Visit: Payer: Self-pay

## 2020-12-17 ENCOUNTER — Encounter: Payer: Self-pay | Admitting: Internal Medicine

## 2020-12-17 ENCOUNTER — Ambulatory Visit (INDEPENDENT_AMBULATORY_CARE_PROVIDER_SITE_OTHER): Payer: Medicare Other | Admitting: Internal Medicine

## 2020-12-17 VITALS — BP 138/56 | HR 80 | Temp 98.3°F | Resp 16 | Ht 66.0 in | Wt 256.0 lb

## 2020-12-17 DIAGNOSIS — M170 Bilateral primary osteoarthritis of knee: Secondary | ICD-10-CM

## 2020-12-17 DIAGNOSIS — Z0001 Encounter for general adult medical examination with abnormal findings: Secondary | ICD-10-CM

## 2020-12-17 DIAGNOSIS — R7303 Prediabetes: Secondary | ICD-10-CM

## 2020-12-17 DIAGNOSIS — I10 Essential (primary) hypertension: Secondary | ICD-10-CM | POA: Diagnosis not present

## 2020-12-17 DIAGNOSIS — Z23 Encounter for immunization: Secondary | ICD-10-CM | POA: Diagnosis not present

## 2020-12-17 DIAGNOSIS — F5104 Psychophysiologic insomnia: Secondary | ICD-10-CM | POA: Diagnosis not present

## 2020-12-17 DIAGNOSIS — F411 Generalized anxiety disorder: Secondary | ICD-10-CM

## 2020-12-17 DIAGNOSIS — E7800 Pure hypercholesterolemia, unspecified: Secondary | ICD-10-CM

## 2020-12-17 LAB — HEPATIC FUNCTION PANEL
ALT: 10 IU/L (ref 0–44)
AST: 10 IU/L (ref 0–40)
Albumin: 4.1 g/dL (ref 3.7–4.7)
Alkaline Phosphatase: 98 IU/L (ref 44–121)
Bilirubin Total: 0.3 mg/dL (ref 0.0–1.2)
Bilirubin, Direct: <0.1 mg/dL (ref 0.00–0.40)
Total Protein: 6.2 g/dL (ref 6.0–8.5)

## 2020-12-17 LAB — POCT GLYCOSYLATED HEMOGLOBIN (HGB A1C): Hemoglobin A1C: 5.9 % — AB (ref 4.0–5.6)

## 2020-12-17 LAB — LIPID PANEL
Chol/HDL Ratio: 3.8 ratio (ref 0.0–5.0)
Cholesterol, Total: 151 mg/dL (ref 100–199)
HDL: 40 mg/dL (ref >39)
LDL Chol Calc (NIH): 89 mg/dL (ref 0–99)
Triglycerides: 124 mg/dL (ref 0–149)
VLDL Cholesterol Cal: 22 mg/dL (ref 5–40)

## 2020-12-17 MED ORDER — TRAMADOL HCL 50 MG PO TABS: 50.0000 mg | ORAL_TABLET | Freq: Two times a day (BID) | ORAL | 1 refills | Status: DC | PRN

## 2020-12-17 MED ORDER — TRAZODONE HCL 50 MG PO TABS: 75.0000 mg | ORAL_TABLET | Freq: Every evening | ORAL | 1 refills | Status: DC | PRN

## 2020-12-17 MED ORDER — ALPRAZOLAM 1 MG PO TABS: 1.0000 mg | ORAL_TABLET | Freq: Every evening | ORAL | 1 refills | Status: DC | PRN

## 2020-12-17 MED ORDER — TRAZODONE HCL 50 MG PO TABS: 25.0000 mg | ORAL_TABLET | Freq: Every evening | ORAL | 1 refills | Status: DC | PRN

## 2020-12-17 NOTE — Patient Instructions (Signed)
Health Maintenance, Male Adopting a healthy lifestyle and getting preventive care are important in promoting health and wellness. Ask your health care provider about: The right schedule for you to have regular tests and exams. Things you can do on your own to prevent diseases and keep yourself healthy. What should I know about diet, weight, and exercise? Eat a healthy diet  Eat a diet that includes plenty of vegetables, fruits, low-fat dairy products, and lean protein. Do not eat a lot of foods that are high in solid fats, added sugars, or sodium.  Maintain a healthy weight Body mass index (BMI) is a measurement that can be used to identify possible weight problems. It estimates body fat based on height and weight. Your health care provider can help determine your BMI and help you achieve or maintain ahealthy weight. Get regular exercise Get regular exercise. This is one of the most important things you can do for your health. Most adults should: Exercise for at least 150 minutes each week. The exercise should increase your heart rate and make you sweat (moderate-intensity exercise). Do strengthening exercises at least twice a week. This is in addition to the moderate-intensity exercise. Spend less time sitting. Even light physical activity can be beneficial. Watch cholesterol and blood lipids Have your blood tested for lipids and cholesterol at 75 years of age, then havethis test every 5 years. You may need to have your cholesterol levels checked more often if: Your lipid or cholesterol levels are high. You are older than 75 years of age. You are at high risk for heart disease. What should I know about cancer screening? Many types of cancers can be detected early and may often be prevented. Depending on your health history and family history, you may need to have cancer screening at various ages. This may include screening for: Colorectal cancer. Prostate cancer. Skin cancer. Lung  cancer. What should I know about heart disease, diabetes, and high blood pressure? Blood pressure and heart disease High blood pressure causes heart disease and increases the risk of stroke. This is more likely to develop in people who have high blood pressure readings, are of African descent, or are overweight. Talk with your health care provider about your target blood pressure readings. Have your blood pressure checked: Every 3-5 years if you are 18-39 years of age. Every year if you are 40 years old or older. If you are between the ages of 65 and 75 and are a current or former smoker, ask your health care provider if you should have a one-time screening for abdominal aortic aneurysm (AAA). Diabetes Have regular diabetes screenings. This checks your fasting blood sugar level. Have the screening done: Once every three years after age 45 if you are at a normal weight and have a low risk for diabetes. More often and at a younger age if you are overweight or have a high risk for diabetes. What should I know about preventing infection? Hepatitis B If you have a higher risk for hepatitis B, you should be screened for this virus. Talk with your health care provider to find out if you are at risk forhepatitis B infection. Hepatitis C Blood testing is recommended for: Everyone born from 1945 through 1965. Anyone with known risk factors for hepatitis C. Sexually transmitted infections (STIs) You should be screened each year for STIs, including gonorrhea and chlamydia, if: You are sexually active and are younger than 75 years of age. You are older than 75 years of age   and your health care provider tells you that you are at risk for this type of infection. Your sexual activity has changed since you were last screened, and you are at increased risk for chlamydia or gonorrhea. Ask your health care provider if you are at risk. Ask your health care provider about whether you are at high risk for HIV.  Your health care provider may recommend a prescription medicine to help prevent HIV infection. If you choose to take medicine to prevent HIV, you should first get tested for HIV. You should then be tested every 3 months for as long as you are taking the medicine. Follow these instructions at home: Lifestyle Do not use any products that contain nicotine or tobacco, such as cigarettes, e-cigarettes, and chewing tobacco. If you need help quitting, ask your health care provider. Do not use street drugs. Do not share needles. Ask your health care provider for help if you need support or information about quitting drugs. Alcohol use Do not drink alcohol if your health care provider tells you not to drink. If you drink alcohol: Limit how much you have to 0-2 drinks a day. Be aware of how much alcohol is in your drink. In the [place] one drink equals one 12 oz bottle of beer (355 mL), one 5 oz glass of wine (148 mL), or one 1 oz glass of hard liquor (44 mL). General instructions Schedule regular health, dental, and eye exams. Stay current with your vaccines. Tell your health care provider if: You often feel depressed. You have ever been abused or do not feel safe at home. Summary Adopting a healthy lifestyle and getting preventive care are important in promoting health and wellness. Follow your health care provider's instructions about healthy diet, exercising, and getting tested or screened for diseases. Follow your health care provider's instructions on monitoring your cholesterol and blood pressure. This information is not intended to replace advice given to you by your health care provider. Make sure you discuss any questions you have with your healthcare provider. Document Revised: 06/13/2018 Document Reviewed: 06/13/2018 Elsevier Patient Education  2022 [employer] Inc.  

## 2020-12-17 NOTE — Progress Notes (Signed)
Subjective:  Patient ID: Aaron [name], male    DOB: [birth year]  Age: 75 y.o. MRN: [id number]  CC: Annual Exam, Hypertension, and Hyperlipidemia  This visit occurred during the SARS-CoV-2 public health emergency.  Safety protocols were in place, including screening questions prior to the visit, additional usage of staff PPE, and extensive cleaning of exam room while observing appropriate contact time as indicated for disinfecting solutions.    HPI AMRO WINEBARGER presents for a CPX, f/up, and to establish.  He would like to increase the dose of trazodone.  He continues to complain of anxiety and insomnia.  He also complains of chronic bilateral knee pain.  He has had a couple of surgeries.  He is not very active but when he moves around he does not experience chest pain, shortness of breath, diaphoresis, dizziness, or lightheadedness.  Outpatient Medications Prior to Visit  Medication Sig Dispense Refill   amLODipine (NORVASC) 5 MG tablet TAKE 1 TABLET (5 MG TOTAL) BY MOUTH DAILY. AT BEDTIME 90 tablet 3   atorvastatin (LIPITOR) 80 MG tablet Take 1 tablet (80 mg total) by mouth daily. 90 tablet 3   ferrous sulfate 325 (65 FE) MG EC tablet TAKE 1 TABLET BY MOUTH EVERY DAY WITH BREAKFAST 90 tablet 3   pantoprazole (PROTONIX) 40 MG tablet Take 1 tablet (40 mg total) by mouth daily. 90 tablet 3   telmisartan-hydrochlorothiazide (MICARDIS HCT) 80-25 MG tablet TAKE 1 TABLET BY MOUTH EVERY DAY 90 tablet 0   venlafaxine XR (EFFEXOR-XR) 75 MG 24 hr capsule TAKE 1 CAPSULE BY MOUTH  DAILY 90 capsule 3   XARELTO 20 MG TABS tablet TAKE 1 TABLET BY MOUTH  DAILY WITH SUPPER 90 tablet 3   ALPRAZolam (XANAX) 1 MG tablet TAKE 1 TABLET (1 MG TOTAL) BY MOUTH AT BEDTIME AS NEEDED FOR ANXIETY OR SLEEP. 30 tablet 0   traMADol (ULTRAM) 50 MG tablet TAKE 1-2 TABLETS (50-100 MG TOTAL) BY MOUTH EVERY 8 (EIGHT) HOURS AS NEEDED. FOR PAIN 180 tablet 0   traZODone (DESYREL) 50 MG tablet TAKE 1/2 TO 1 TABLET BY MOUTH AT  BEDTIME AS NEEDED FOR SLEEP 90 tablet 1   No facility-administered medications prior to visit.    ROS Review of Systems  Constitutional:  Negative for chills, diaphoresis, fatigue and fever.  HENT: Negative.    Eyes:  Negative for visual disturbance.  Respiratory:  Negative for cough, chest tightness, shortness of breath and wheezing.   Cardiovascular:  Negative for chest pain, palpitations and leg swelling.  Gastrointestinal:  Negative for abdominal pain, constipation, diarrhea, nausea and vomiting.  Endocrine: Negative.   Genitourinary: Negative.  Negative for difficulty urinating, scrotal swelling and testicular pain.  Musculoskeletal:  Positive for arthralgias. Negative for back pain and myalgias.  Skin: Negative.   Neurological: Negative.  Negative for dizziness, weakness, light-headedness and headaches.  Hematological:  Negative for adenopathy. Does not bruise/bleed easily.  Psychiatric/Behavioral:  Positive for dysphoric mood and sleep disturbance. Negative for behavioral problems, confusion, decreased concentration, self-injury and suicidal ideas. The patient is nervous/anxious. The patient is not hyperactive.    Objective:  BP (!) 138/56 (BP Location: Left Arm, Patient Position: Sitting, Cuff Size: Large)   Pulse 80   Temp 98.3 F (36.8 C) (Oral)   Resp 16   Ht 5\' 6"  (1.676 m)   Wt 256 lb (116.1 kg)   SpO2 97%   BMI 41.32 kg/m   BP Readings from Last 3 Encounters:  12/17/20 (!) 138/56  10/12/20 Marland Kitchen)  150/70  10/12/20 (!) 170/80    Wt Readings from Last 3 Encounters:  12/17/20 256 lb (116.1 kg)  10/12/20 255 lb 6.4 oz (115.8 kg)  10/12/20 255 lb 6.4 oz (115.8 kg)    Physical Exam Vitals reviewed.  Constitutional:      Appearance: He is obese. He is not ill-appearing.  HENT:     Nose: Nose normal.     Mouth/Throat:     Mouth: Mucous membranes are moist.  Eyes:     General: No scleral icterus.    Conjunctiva/sclera: Conjunctivae normal.  Cardiovascular:      Rate and Rhythm: Normal rate and regular rhythm.     Heart sounds: No murmur heard. Pulmonary:     Effort: Pulmonary effort is normal.     Breath sounds: No stridor. No wheezing, rhonchi or rales.  Abdominal:     General: Abdomen is protuberant. Bowel sounds are normal. There is no distension.     Palpations: Abdomen is soft. There is no hepatomegaly, splenomegaly or mass.     Tenderness: There is no abdominal tenderness. There is no guarding.  Musculoskeletal:        General: Deformity (DJD both knees) present. Normal range of motion.     Cervical back: Neck supple.     Right lower leg: No edema.     Left lower leg: No edema.  Lymphadenopathy:     Cervical: No cervical adenopathy.  Skin:    General: Skin is warm and dry.  Neurological:     General: No focal deficit present.     Mental Status: He is alert.  Psychiatric:        Attention and Perception: He is inattentive.        Mood and Affect: Mood is anxious. Mood is not depressed.        Speech: He is communicative. Speech is tangential. Speech is not delayed.        Behavior: Behavior normal. Behavior is not slowed or withdrawn. Behavior is cooperative.        Thought Content: Thought content normal.    Lab Results  Component Value Date   WBC 7.0 09/24/2020   HGB 11.6 (L) 09/24/2020   HCT 35.0 (L) 09/24/2020   PLT 244 09/24/2020   GLUCOSE 106 (H) 06/15/2020   CHOL 151 12/17/2020   TRIG 124 12/17/2020   HDL 40 12/17/2020   LDLDIRECT 105.0 07/22/2019   LDLCALC 89 12/17/2020   ALT 10 12/17/2020   AST 10 12/17/2020   NA 139 06/15/2020   K 4.2 06/15/2020   CL 106 06/15/2020   CREATININE 1.24 06/15/2020   BUN 21 06/15/2020   CO2 25 06/15/2020   TSH 2.81 10/12/2020   PSA 0.59 02/14/2018   INR 1.11 03/25/2016   HGBA1C 5.9 (A) 12/17/2020    CT Chest Wo Contrast  Result Date: 10/26/2020 CLINICAL DATA:  Cough and persistently abnormal chest radiograph EXAM: CT CHEST WITHOUT CONTRAST TECHNIQUE: Multidetector CT  imaging of the chest was performed following the standard protocol without IV contrast. COMPARISON:  06/20/2013 FINDINGS: Cardiovascular: Heart size normal. No pericardial effusion. Aortic atherosclerosis. Coronary artery calcifications. Mediastinum/Nodes: No enlarged mediastinal or axillary lymph nodes. Thyroid gland, trachea, and esophagus demonstrate no significant findings. Lungs/Pleura: No pleural effusion, airspace consolidation, or atelectasis. No pneumothorax. Scar like density noted within the posteromedial left apex, image 28/3. Calcified granulomas noted bilaterally. A few scattered noncalcified lung nodules are also noted. The largest is in the superior segment of the left  lower lobe measuring 5 mm. Upper Abdomen: No acute abnormality. Left hepatic lobe cyst measures 2.4 cm. Aortic atherosclerosis noted. Musculoskeletal: No chest wall mass or suspicious bone lesions identified. IMPRESSION: 1. No active cardiopulmonary abnormalities. 2. A few scattered noncalcified lung nodules are identified measuring up to 5 mm. These are nonspecific. No follow-up needed if patient is low-risk (and has no known or suspected primary neoplasm). Non-contrast chest CT can be considered in 12 months if patient is high-risk. This recommendation follows the consensus statement: Guidelines for Management of Incidental Pulmonary Nodules Detected on CT Images: From the Fleischner Society 2017; Radiology 2017; 284:228-243. 3. Aortic atherosclerosis and coronary artery calcifications. Aortic Atherosclerosis (ICD10-I70.0). Electronically Signed   By: Kerby Moors M.D.   On: 10/26/2020 10:33    Assessment & Plan:   Kameryn was seen today for annual exam, hypertension and hyperlipidemia.  Diagnoses and all orders for this visit:  Psychophysiological insomnia -     Discontinue: traZODone (DESYREL) 50 MG tablet; Take 0.5-1 tablets (25-50 mg total) by mouth at bedtime as needed. -     traZODone (DESYREL) 50 MG tablet; Take  1.5 tablets (75 mg total) by mouth at bedtime as needed.  GAD (generalized anxiety disorder) -     ALPRAZolam (XANAX) 1 MG tablet; Take 1 tablet (1 mg total) by mouth at bedtime as needed for anxiety or sleep. -     traZODone (DESYREL) 50 MG tablet; Take 1.5 tablets (75 mg total) by mouth at bedtime as needed.  Pre-diabetes-his blood sugar is adequately well controlled. -     Basic metabolic panel; Future -     POCT glycosylated hemoglobin (Hb A1C)  Primary osteoarthritis of both knees -     Discontinue: traMADol (ULTRAM) 50 MG tablet; Take 1-2 tablets (50-100 mg total) by mouth every 12 (twelve) hours as needed. for pain -     traMADol (ULTRAM) 50 MG tablet; Take 1 tablet (50 mg total) by mouth every 6 (six) hours as needed. for pain  Primary hypertension- His blood pressure is adequately well controlled.  I will monitor his electrolytes and renal function. -     Basic metabolic panel; Future  Encounter for general adult medical examination with abnormal findings- Exam completed, labs reviewed, vaccines reviewed and updated, no cancer screenings are indicated, patient education was given.  Other orders -     Pneumococcal polysaccharide vaccine 23-valent greater than or equal to 2yo subcutaneous/IM  I have discontinued Jaymason L. Tahir "Galan Catterton"'s traZODone and traMADol. I have also changed his traZODone and traMADol. Additionally, I am having him maintain his pantoprazole, venlafaxine XR, amLODipine, atorvastatin, Xarelto, ferrous sulfate, telmisartan-hydrochlorothiazide, and ALPRAZolam.  Meds ordered this encounter  Medications   ALPRAZolam (XANAX) 1 MG tablet    Sig: Take 1 tablet (1 mg total) by mouth at bedtime as needed for anxiety or sleep.    Dispense:  90 tablet    Refill:  1   DISCONTD: traMADol (ULTRAM) 50 MG tablet    Sig: Take 1-2 tablets (50-100 mg total) by mouth every 12 (twelve) hours as needed. for pain    Dispense:  180 tablet    Refill:  1    Not to exceed 2  additional fills before 03/13/2021   DISCONTD: traZODone (DESYREL) 50 MG tablet    Sig: Take 0.5-1 tablets (25-50 mg total) by mouth at bedtime as needed.    Dispense:  90 tablet    Refill:  1   traZODone (DESYREL) 50 MG tablet  Sig: Take 1.5 tablets (75 mg total) by mouth at bedtime as needed.    Dispense:  135 tablet    Refill:  1   traMADol (ULTRAM) 50 MG tablet    Sig: Take 1 tablet (50 mg total) by mouth every 6 (six) hours as needed. for pain    Dispense:  270 tablet    Refill:  1    Not to exceed 2 additional fills before 03/13/2021     Follow-up: Return in about 6 months (around 06/18/2021).  Scarlette Calico, MD

## 2020-12-18 ENCOUNTER — Telehealth: Payer: Self-pay | Admitting: Internal Medicine

## 2020-12-18 NOTE — Telephone Encounter (Signed)
Patient states medication will not last 30 days  Patient has questions regarding traMADol (ULTRAM) 50 MG tablet

## 2020-12-19 MED ORDER — TRAMADOL HCL 50 MG PO TABS: 50.0000 mg | ORAL_TABLET | Freq: Four times a day (QID) | ORAL | 1 refills | Status: DC | PRN

## 2020-12-22 ENCOUNTER — Other Ambulatory Visit: Payer: Self-pay | Admitting: Gastroenterology

## 2020-12-22 DIAGNOSIS — K5730 Diverticulosis of large intestine without perforation or abscess without bleeding: Secondary | ICD-10-CM | POA: Diagnosis not present

## 2020-12-22 DIAGNOSIS — K429 Umbilical hernia without obstruction or gangrene: Secondary | ICD-10-CM | POA: Diagnosis not present

## 2020-12-22 DIAGNOSIS — D509 Iron deficiency anemia, unspecified: Secondary | ICD-10-CM | POA: Diagnosis not present

## 2020-12-22 DIAGNOSIS — K439 Ventral hernia without obstruction or gangrene: Secondary | ICD-10-CM | POA: Diagnosis not present

## 2020-12-22 DIAGNOSIS — Z86010 Personal history of colonic polyps: Secondary | ICD-10-CM | POA: Diagnosis not present

## 2020-12-23 ENCOUNTER — Encounter: Payer: Self-pay | Admitting: Internal Medicine

## 2020-12-23 ENCOUNTER — Inpatient Hospital Stay: Payer: Medicare Other

## 2020-12-23 ENCOUNTER — Inpatient Hospital Stay: Payer: Medicare Other | Attending: Internal Medicine | Admitting: Internal Medicine

## 2020-12-23 ENCOUNTER — Telehealth: Payer: Self-pay | Admitting: *Deleted

## 2020-12-23 ENCOUNTER — Other Ambulatory Visit: Payer: Self-pay

## 2020-12-23 VITALS — BP 172/58 | HR 79 | Temp 98.7°F | Resp 19 | Ht 66.0 in | Wt 258.7 lb

## 2020-12-23 DIAGNOSIS — Z7901 Long term (current) use of anticoagulants: Secondary | ICD-10-CM | POA: Insufficient documentation

## 2020-12-23 DIAGNOSIS — G4733 Obstructive sleep apnea (adult) (pediatric): Secondary | ICD-10-CM | POA: Insufficient documentation

## 2020-12-23 DIAGNOSIS — D509 Iron deficiency anemia, unspecified: Secondary | ICD-10-CM | POA: Diagnosis not present

## 2020-12-23 DIAGNOSIS — I10 Essential (primary) hypertension: Secondary | ICD-10-CM | POA: Insufficient documentation

## 2020-12-23 DIAGNOSIS — D500 Iron deficiency anemia secondary to blood loss (chronic): Secondary | ICD-10-CM

## 2020-12-23 DIAGNOSIS — Z86711 Personal history of pulmonary embolism: Secondary | ICD-10-CM | POA: Insufficient documentation

## 2020-12-23 DIAGNOSIS — Z79899 Other long term (current) drug therapy: Secondary | ICD-10-CM | POA: Diagnosis not present

## 2020-12-23 LAB — CBC WITH DIFFERENTIAL (CANCER CENTER ONLY)
Abs Immature Granulocytes: 0.06 10*3/uL (ref 0.00–0.07)
Basophils Absolute: 0.1 10*3/uL (ref 0.0–0.1)
Basophils Relative: 1 %
Eosinophils Absolute: 0.3 10*3/uL (ref 0.0–0.5)
Eosinophils Relative: 4 %
HCT: 34.2 % — ABNORMAL LOW (ref 39.0–52.0)
Hemoglobin: 11.4 g/dL — ABNORMAL LOW (ref 13.0–17.0)
Immature Granulocytes: 1 %
Lymphocytes Relative: 23 %
Lymphs Abs: 1.5 10*3/uL (ref 0.7–4.0)
MCH: 29.1 pg (ref 26.0–34.0)
MCHC: 33.3 g/dL (ref 30.0–36.0)
MCV: 87.2 fL (ref 80.0–100.0)
Monocytes Absolute: 0.6 10*3/uL (ref 0.1–1.0)
Monocytes Relative: 9 %
Neutro Abs: 4.1 10*3/uL (ref 1.7–7.7)
Neutrophils Relative %: 62 %
Platelet Count: 218 10*3/uL (ref 150–400)
RBC: 3.92 MIL/uL — ABNORMAL LOW (ref 4.22–5.81)
RDW: 13.3 % (ref 11.5–15.5)
WBC Count: 6.6 10*3/uL (ref 4.0–10.5)
nRBC: 0 % (ref 0.0–0.2)

## 2020-12-23 NOTE — Telephone Encounter (Signed)
Patient returning call.

## 2020-12-23 NOTE — Telephone Encounter (Signed)
   [facility] Pre-operative Risk Assessment    Patient Name: Aaron [name]  DOB: September 27, 1945  MRN: [id number]   HEARTCARE STAFF: - Please ensure there is not already an duplicate clearance open for this procedure. - Under Visit Info/Reason for Call, type in Other and utilize the format Clearance MM/DD/YY or Clearance TBD. Do not use dashes or single digits. - If request is for dental extraction, please clarify the # of teeth to be extracted. - If the patient is currently at the dentist's office, call Pre-Op APP to address. If the patient is not currently in the dentist office, please route to the Pre-Op pool  Request for surgical clearance:  What type of surgery is being performed? EGD and colonoscopy   When is this surgery scheduled? 01/15/2021   What type of clearance is required (medical clearance vs. Pharmacy clearance to hold med vs. Both)? both  Are there any medications that need to be held prior to surgery and how long?xarelto-need direction   Practice name and name of physician performing surgery? [facility]   What is the office phone number? [phone]   7.   What is the office fax number? [phone]  8.   Anesthesia type (None, local, MAC, general) ? propofol   Fredia Beets 12/23/2020, 10:56 AM  _________________________________________________________________   (provider comments below)

## 2020-12-23 NOTE — Telephone Encounter (Signed)
Left VM for clearance.   Will need to reach out to PCP for xarelto hold.

## 2020-12-23 NOTE — Progress Notes (Signed)
[facility] [phone]   [phone]  OFFICE PROGRESS NOTE  [name], MD [place] [id number]  DIAGNOSIS: Microcytic anemia secondary to iron deficiency  PRIOR THERAPY: None  CURRENT THERAPY: Ferrous sulfate 325 mg p.o. daily.  INTERVAL HISTORY: Aaron [name] 75 y.o. male returns to the clinic today for follow-up visit.  The patient is feeling fine today with no concerning complaints except for mild fatigue.  His scheduled to have colonoscopy in few weeks under the care of Dr. Collene Mares.  He denied having any current chest pain but has shortness of breath with exertion with no cough or hemoptysis.  He denied having any fever or chills.  He has no nausea, vomiting, diarrhea or constipation.  He has no headache or visual changes.  He has no weight loss or night sweats.  The patient is here today for evaluation and repeat blood work.  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  MEDICATIONS:  Current Outpatient Medications  Medication Sig Dispense Refill   ALPRAZolam (XANAX) 1 MG tablet Take 1 tablet (1 mg  total) by mouth at bedtime as needed for anxiety or sleep. 90 tablet 1   amLODipine (NORVASC) 5 MG tablet TAKE 1 TABLET (5 MG TOTAL) BY MOUTH DAILY. AT BEDTIME 90 tablet 3   atorvastatin (LIPITOR) 80 MG tablet Take 1 tablet (80 mg total) by mouth daily. 90 tablet 3   ferrous sulfate 325 (65 FE) MG EC tablet TAKE 1 TABLET BY MOUTH EVERY DAY WITH BREAKFAST 90 tablet 3   pantoprazole (PROTONIX) 40 MG tablet Take 1 tablet (40 mg total) by mouth daily. 90 tablet 3   telmisartan-hydrochlorothiazide (MICARDIS HCT) 80-25 MG tablet TAKE 1 TABLET BY MOUTH EVERY DAY 90 tablet 0   traMADol (ULTRAM) 50 MG tablet Take 1 tablet (50 mg total) by mouth every 6 (six) hours as needed. for pain (Patient taking differently: Take 100 mg by mouth every 8 (eight) hours as needed. for pain) 270 tablet 1   traZODone (DESYREL) 50 MG tablet Take 1.5 tablets (75 mg total) by mouth at bedtime as needed. 135 tablet 1   venlafaxine XR (EFFEXOR-XR) 75 MG 24 hr capsule TAKE 1 CAPSULE BY MOUTH  DAILY 90 capsule 3   XARELTO 20 MG TABS tablet TAKE 1 TABLET BY MOUTH  DAILY WITH SUPPER 90 tablet 3   No current  facility-administered medications for this visit.    SURGICAL HISTORY:  Past Surgical History:  Procedure Laterality Date   CARDIAC CATHETERIZATION     carpel tunnel release Bilateral ~ 06/2005/~ 08/2005   right/left   COLONOSCOPY W/ BIOPSIES AND POLYPECTOMY     CORONARY ANGIOPLASTY     CORONARY STENT PLACEMENT  02/13/2004   1   ENUCLEATION Right    age 42   EYE SURGERY     age 76; "ruptured  right pupil"   EYE SURGERY     age 8   [place]   placed artificial right eye   INGUINAL HERNIA REPAIR Right 1983   INGUINAL HERNIA REPAIR Left 1985   KNEE ARTHROSCOPY  ~ 06/2005   right   MULTIPLE TOOTH EXTRACTIONS     spinal injections  2006-2007   "maybe 10 injections for herniated discs"   TONSILLECTOMY     "when I was real young"   TOTAL KNEE ARTHROPLASTY Left 10/09/2015   Procedure: TOTAL KNEE ARTHROPLASTY;   Surgeon: Dorna Leitz, MD;  Location: [place];  Service: Orthopedics;  Laterality: Left;   TOTAL KNEE ARTHROPLASTY Right 04/04/2016   Procedure: TOTAL KNEE ARTHROPLASTY;  Surgeon: Dorna Leitz, MD;  Location: [place];  Service: Orthopedics;  Laterality: Right;    REVIEW OF SYSTEMS:  A comprehensive review of systems was negative except for: Constitutional: positive for fatigue   PHYSICAL EXAMINATION: General appearance: alert, cooperative, fatigued, and no distress Head: Normocephalic, without obvious abnormality, atraumatic Neck: no adenopathy, no JVD, supple, symmetrical, trachea midline, and thyroid not enlarged, symmetric, no tenderness/mass/nodules Lymph nodes: Cervical, supraclavicular, and axillary nodes normal. Resp: clear to auscultation bilaterally Back: symmetric, no curvature. ROM normal. No CVA tenderness. Cardio: regular rate and rhythm, S1, S2 normal, no murmur, click, rub or gallop GI: soft, non-tender; bowel sounds normal; no masses,  no organomegaly Extremities: extremities normal, atraumatic, no cyanosis or edema  ECOG PERFORMANCE STATUS: 1 - Symptomatic but completely ambulatory  Blood pressure (!) 172/58, pulse 79, temperature 98.7 F (37.1 C), temperature source Tympanic, resp. rate 19, height 5\' 6"  (1.676 m), weight 258 lb 11.2 oz (117.3 kg), SpO2 98 %.  LABORATORY DATA: Lab Results  Component Value Date   WBC 6.6 12/23/2020   HGB 11.4 (L) 12/23/2020   HCT 34.2 (L) 12/23/2020   MCV 87.2 12/23/2020   PLT 218 12/23/2020      Chemistry      Component Value Date/Time   NA 139 06/15/2020 1306   K 4.2 06/15/2020 1306   CL 106 06/15/2020 1306   CO2 25 06/15/2020 1306   BUN 21 06/15/2020 1306   CREATININE 1.24 06/15/2020 1306   CREATININE 1.16 02/10/2020 1532      Component Value Date/Time   CALCIUM 9.0 06/15/2020 1306   ALKPHOS 98 12/17/2020 1017   AST 10 12/17/2020 1017   AST 17 06/15/2020 1306   ALT 10 12/17/2020 1017   ALT 12 06/15/2020 1306   BILITOT 0.3  12/17/2020 1017   BILITOT 0.4 06/15/2020 1306       RADIOGRAPHIC STUDIES: No results found.  ASSESSMENT AND PLAN: This is a very pleasant 75 years old white male with history of iron deficiency anemia secondary to chronic gastrointestinal blood loss. The patient is currently on oral iron tablet with ferrous sulfate 325 mg p.o. daily and tolerating this treatment well. The patient has no concerning adverse effect of this treatment. Repeat CBC today showed persistent mild anemia with hemoglobin of 11.4.  Iron study and ferritin are still pending. I recommended for the patient to continue with the oral iron tablets for now. I will see him back for follow-up visit in 3 months for evaluation with repeat CBC, iron study and ferritin.  If the pending iron studies showed significant iron deficiency, will arrange for the patient to receive iron infusion in the interval. He was advised to call immediately if he has any other concerning symptoms in the interval. The patient voices understanding of current disease status and treatment options and is in agreement with the current care plan.  All questions were answered. The patient knows to call the clinic with any problems, questions or concerns. We can certainly see the patient much sooner if necessary.  The total time spent in the appointment was 20 minutes.  Disclaimer: This note was dictated with voice recognition software. Similar sounding words can inadvertently be transcribed and may not be corrected upon review.

## 2020-12-24 ENCOUNTER — Ambulatory Visit (INDEPENDENT_AMBULATORY_CARE_PROVIDER_SITE_OTHER): Payer: Medicare Other | Admitting: Pharmacist Clinician (PhC)/ Clinical Pharmacy Specialist

## 2020-12-24 DIAGNOSIS — E7800 Pure hypercholesterolemia, unspecified: Secondary | ICD-10-CM | POA: Diagnosis not present

## 2020-12-24 LAB — IRON AND TIBC
Iron: 59 ug/dL (ref 42–163)
Saturation Ratios: 21 % (ref 20–55)
TIBC: 275 ug/dL (ref 202–409)
UIBC: 216 ug/dL (ref 117–376)

## 2020-12-24 LAB — FERRITIN: Ferritin: 26 ng/mL (ref 24–336)

## 2020-12-24 MED ORDER — EZETIMIBE 10 MG PO TABS: 10.0000 mg | ORAL_TABLET | Freq: Every day | ORAL | 3 refills | Status: DC

## 2020-12-24 NOTE — Progress Notes (Signed)
12/24/2020 Aaron [name] May 12, 1946 [id number]   HPI:  Aaron [name] is a 75 y.o. male patient of Dr Martinique, who presents today for a lipid clinic evaluation.  See pertinent past medical history below.  He had recent labs drawn which showed LDL cholesterol to be at 89 with maximum dose of atorvastatin.  No problems with compliance or swallowing the tablet.  Today he is in the office to discuss additional medications to help lower his cholesterol.    Biggest concern is medication costs.  Already falls into the coverage gap by June/July because of Xarelto, would be a financial challenge to pay ~$300 per month to get two branded drugs.    Past Medical History: CAD Stent to mid LAD in 2005   hypertension Still elevated - on telmisartan hctz 80/25, amlodipine 5   anemia Iron deficiency due to blood loss - followed by heme/onc  PE/DVT Multiple events, on lifelong Xarelto  GERD On pantoprazole    Current Medications: atorvastatin 80 mg qd  Cholesterol Goals: LDL < 70  Family history: mother with heart disease, died at 19; father had asthma/emphysema, smoker, died in his 21's; brother with no heart disease more lung issues; 3 children, no known health issues   Diet: more home cooked, chicken or pork chops; some vegetables; more brown rice - most meals; uses air fryer  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 90 tablet 1   amLODipine (NORVASC) 5 MG tablet TAKE 1 TABLET (5 MG TOTAL) BY MOUTH DAILY. AT BEDTIME 90 tablet 3   atorvastatin (LIPITOR) 80 MG tablet Take 1 tablet (80 mg total) by mouth daily. 90 tablet 3   ezetimibe (ZETIA) 10 MG tablet Take 1 tablet (10 mg total) by mouth daily. 90 tablet 3   ferrous sulfate 325 (65 FE) MG EC  tablet TAKE 1 TABLET BY MOUTH EVERY DAY WITH BREAKFAST 90 tablet 3   pantoprazole (PROTONIX) 40 MG tablet Take 1 tablet (40 mg total) by mouth daily. 90 tablet 3   polyethylene glycol (MIRALAX / GLYCOLAX) 17 g packet Take 17 g by mouth daily.     telmisartan-hydrochlorothiazide (MICARDIS HCT) 80-25 MG tablet TAKE 1 TABLET BY MOUTH EVERY DAY 90 tablet 0   traMADol (ULTRAM) 50 MG tablet Take 1 tablet (50 mg total) by mouth every 6 (six) hours as needed. for pain (Patient taking differently: Take 100 mg by mouth every 8 (eight) hours as needed. for pain) 270 tablet 1   traZODone (DESYREL) 50 MG tablet Take 1.5 tablets (75 mg total) by mouth at bedtime as needed. 135 tablet 1   venlafaxine XR (EFFEXOR-XR) 75 MG 24 hr capsule TAKE 1 CAPSULE BY MOUTH  DAILY 90 capsule 3   XARELTO 20 MG TABS tablet TAKE 1 TABLET BY MOUTH  DAILY WITH SUPPER 90 tablet 3   No current facility-administered medications for this visit.    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 rate 14, height 5\' 6"   (1.676 m), weight 257 lb (116.6 kg), SpO2 94 %.   Hyperlipidemia Patient with ASCVD and hyperlipidemia, currently not at LDL goal.  Patient compliant with atorvastatin 80 mg and has no complaints or side effects regarding this.  Reviewed options for lowering LDL cholesterol, including ezetimibe, PCSK-9 inhibitors, bempedoic acid and inclisiran.  Discussed mechanisms of action, dosing, side effects and potential decreases in LDL cholesterol.  Answered all patient questions.  Based on this information, patient would prefer to start ezetimibe.  Because of cost issues associated with the brand products, we will hold off for now.  He is in the coverage gap by July each year because of his Eliquis prescription, and would be difficult to manage 2 branded drugs each month.  Because his LDL is at 33, we will start him on ezetimibe 10 mg daily and discussed dietary changes - including cutting back on eggs and eating steel cut oatmeal at least 2-3 days each week.  He is scheduled to see Dr. Martinique in August, at which time they can repeat labs to see if he is at goal.     Tommy Medal PharmD CPP [facility] [address] [place], [place] [address] [phone]

## 2020-12-24 NOTE — Patient Instructions (Addendum)
Your Results:             Your most recent labs Goal  Total Cholesterol 151 < 200  Triglycerides 124 < 150  HDL (happy/good cholesterol) 40 > 40  LDL (lousy/bad cholesterol 89 < 70     Medication changes:  Start ezetimibe 10 mg once daily.  Continue atorvastatin  Eat more steel-cut oats, they are good at helping to lower cholesterol   Patient Assistance:  The [employer] foundation offers assistance to help pay for medication copays.  They will cover copays for all cholesterol lowering meds, including statins, fibrates, omega-3 oils, ezetimibe, Repatha, Praluent, Nexletol, Nexlizet.  The cards are usually good for $2,500 or 12 months, whichever comes first. Go to healthwellfoundation.org Click on "Apply Now" Answer questions as to whom is applying (patient or representative) Your disease fund will be "hypercholesterolemia - Medicare access" They will ask questions about finances and which medications you are taking for cholesterol When you submit, the approval is usually within minutes.  You will need to print the card information from the site You will need to show this information to your pharmacy, they will bill your Medicare Part D plan first -then bill [employer] --for the copay.   You can also call them at [phone], although the hold times can be quite long.   Thank you for choosing [facility]

## 2020-12-24 NOTE — Assessment & Plan Note (Signed)
Patient with ASCVD and hyperlipidemia, currently not at LDL goal.  Patient compliant with atorvastatin 80 mg and has no complaints or side effects regarding this.  Reviewed options for lowering LDL cholesterol, including ezetimibe, PCSK-9 inhibitors, bempedoic acid and inclisiran.  Discussed mechanisms of action, dosing, side effects and potential decreases in LDL cholesterol.  Answered all patient questions.  Based on this information, patient would prefer to start ezetimibe.  Because of cost issues associated with the brand products, we will hold off for now.  He is in the coverage gap by July each year because of his Eliquis prescription, and would be difficult to manage 2 branded drugs each month.  Because his LDL is at 75, we will start him on ezetimibe 10 mg daily and discussed dietary changes - including cutting back on eggs and eating steel cut oatmeal at least 2-3 days each week.  He is scheduled to see Dr. Martinique in August, at which time they can repeat labs to see if he is at goal.

## 2020-12-24 NOTE — Telephone Encounter (Signed)
   Name: Aaron [name]  DOB: 08-Jan-1946  MRN: [id number]   Primary Cardiologist: Peter Martinique, MD  Chart reviewed as part of pre-operative protocol coverage. Patient was contacted 12/24/2020 in reference to pre-operative risk assessment for pending surgery as outlined below.  Aaron [name] was last seen on 05/14/20 by Dr. Martinique.  Since that day, Aaron [name] has done well.  He can complete more than 4.0 METS without angina. He takes xarelto for PE/DVT managed by PCP. Please reach out to PCP for xarelto hold +/- bridging.   Therefore, based on ACC/AHA guidelines, the patient would be at acceptable risk for the planned procedure without further cardiovascular testing.   The patient was advised that if he develops new symptoms prior to surgery to contact our office to arrange for a follow-up visit, and he verbalized understanding.  I will route this recommendation to the requesting party via Epic fax function and remove from pre-op pool. Please call with questions.  Tami Lin Kieli Golladay, PA 12/24/2020, 10:39 AM

## 2020-12-28 ENCOUNTER — Telehealth: Payer: Self-pay | Admitting: Internal Medicine

## 2020-12-28 ENCOUNTER — Other Ambulatory Visit: Payer: Self-pay | Admitting: Internal Medicine

## 2020-12-28 DIAGNOSIS — M170 Bilateral primary osteoarthritis of knee: Secondary | ICD-10-CM

## 2020-12-28 MED ORDER — TRAMADOL HCL 50 MG PO TABS: 100.0000 mg | ORAL_TABLET | Freq: Four times a day (QID) | ORAL | 1 refills | Status: DC | PRN

## 2020-12-28 NOTE — Telephone Encounter (Signed)
Called pt to r/s appt per 6/27 sch msg. No answer. Left msg for pt to call back to r/s.

## 2020-12-28 NOTE — Telephone Encounter (Signed)
Patient has came to the office to tell us he is out of:  traMADol (ULTRAM) 50 MG tablet  His previous prescription was "1-2 a day, every 8 hours" he was not aware of the change to "1-2 a day every 12 hours"  Now he is in need of a refill due to taking them more than he should.   Please send to [id number] [place],  - [place]

## 2020-12-30 ENCOUNTER — Encounter: Payer: Self-pay | Admitting: Pulmonary Disease

## 2020-12-30 ENCOUNTER — Ambulatory Visit: Payer: Medicare Other | Admitting: Pulmonary Disease

## 2020-12-30 ENCOUNTER — Other Ambulatory Visit: Payer: Self-pay

## 2020-12-30 VITALS — BP 150/80 | HR 71 | Temp 98.1°F | Ht 66.0 in | Wt 256.1 lb

## 2020-12-30 DIAGNOSIS — G4733 Obstructive sleep apnea (adult) (pediatric): Secondary | ICD-10-CM | POA: Diagnosis not present

## 2020-12-30 DIAGNOSIS — R911 Solitary pulmonary nodule: Secondary | ICD-10-CM

## 2020-12-30 NOTE — Progress Notes (Signed)
[facility]  Chief Complaint  Patient presents with   Follow-up    Cough, better. CT scan on 10/24/20.  F/U OSA    Constitutional:  BP (!) 150/80 (BP Location: Left Arm, Patient Position: Sitting, Cuff Size: Large)   Pulse 71   Temp 98.1 F (36.7 C) (Oral)   Ht 5\' 6"  (1.676 m)   Wt 256 lb 2 oz (116.2 kg)   SpO2 98%   BMI 41.34 kg/m   Past Medical History:    Past Surgical History:  He  has a past surgical history that includes spinal injections (2006-2007); carpel tunnel release (Bilateral, ~ 06/2005/~ 08/2005); Coronary stent placement (02/13/2004); Inguinal hernia repair (Right, 1983); Inguinal hernia repair (Left, 1985); Tonsillectomy; Eye surgery; Eye surgery; Enucleation (Right); Eye surgery [id number]); Cardiac catheterization; Knee arthroscopy (~ 06/2005); Coronary angioplasty; Total knee arthroplasty (Left, 10/09/2015); Colonoscopy w/ biopsies and polypectomy; Multiple tooth extractions; and Total knee arthroplasty (Right, 04/04/2016).  Brief Summary:  Aaron [name] is a 75 y.o. male with obstructive sleep apnea and lung nodule.      Subjective:   He had cough in March.  PCP ordered CT chest.  Showed lung nodules.  Cough better now.  Using CPAP nightly.  No issues with mask fit.  Trazodone helps his sleep.  Physical Exam:   Appearance - well kempt   ENMT - no sinus tenderness, no oral exudate, no LAN, Mallampati 3 airway, no stridor  Respiratory - equal breath sounds bilaterally, no wheezing or rales  CV - s1s2 regular rate and rhythm, no murmurs  Ext - no clubbing, no edema  Skin - no rashes  Psych - normal mood and affect   Pulmonary testing:    Chest Imaging:  06/08/11 CT chest >> b/l PE, ATX, LUL 79mm nodule 12/11/11 CT chest >> LUL nodule resolved, but new b/l nodules up to 8 mm 06/20/12 CT chest >> no change 06/20/13 CT chest >> no change  Sleep Tests:  PSG 09/22/13 >> AHI 5, RDI 61.4, SaO2 low 90%. Auto  CPAP 11/29/20 to 12/28/20 >> used on 30 of 30 nights with average 7 hrs 53.  Average AHI 0.6 with median CPAP 10 and 95 th percentile CPAP 13 cm H2O  Social History:  He  reports that he quit smoking about 25 years ago. His smoking use included cigarettes. He has a 37.50 pack-year smoking history. He has never used smokeless tobacco. He reports that he does not drink alcohol and does not use drugs.  Family History:  His family history includes Asthma in his brother, father, and sister; Breast cancer in his sister; Emphysema in his father; Liver cancer in his sister.     Assessment/Plan:   Obstructive sleep apnea. - he is compliant with CPAP and reports benefit from therapy - he uses [employer] for his DME - continue auto CPAP 5 to 20 cm H2O  Lung nodule - will schedule CT chest w/o contrast for April 2023  Time Spent Involved in Patient Care on Day of Examination:  24 minutes  Follow up:   Patient Instructions  Will schedule CT chest for April 2023  Follow up in 1 year  Medication List:   Allergies as of 12/30/2020       Reactions   Wellbutrin [bupropion] Other (See Comments)   Hallucinations, sweating   Amoxicillin Palpitations        Medication List        Accurate as of December 30, 2020  4:24 PM. If you have any questions, ask your nurse or doctor.          ALPRAZolam 1 MG tablet Commonly known as: XANAX Take 1 tablet (1 mg total) by mouth at bedtime as needed for anxiety or sleep.   amLODipine 5 MG tablet Commonly known as: NORVASC TAKE 1 TABLET (5 MG TOTAL) BY MOUTH DAILY. AT BEDTIME   atorvastatin 80 MG tablet Commonly known as: LIPITOR Take 1 tablet (80 mg total) by mouth daily.   ezetimibe 10 MG tablet Commonly known as: ZETIA Take 1 tablet (10 mg total) by mouth daily.   ferrous sulfate 325 (65 FE) MG EC tablet TAKE 1 TABLET BY MOUTH EVERY DAY WITH BREAKFAST   pantoprazole 40 MG tablet Commonly known as: PROTONIX Take 1 tablet (40 mg total) by  mouth daily.   polyethylene glycol 17 g packet Commonly known as: MIRALAX / GLYCOLAX Take 17 g by mouth daily.   telmisartan-hydrochlorothiazide 80-25 MG tablet Commonly known as: MICARDIS HCT TAKE 1 TABLET BY MOUTH EVERY DAY   traMADol 50 MG tablet Commonly known as: ULTRAM Take 2 tablets (100 mg total) by mouth every 6 (six) hours as needed. for pain   traZODone 50 MG tablet Commonly known as: DESYREL Take 1.5 tablets (75 mg total) by mouth at bedtime as needed.   venlafaxine XR 75 MG 24 hr capsule Commonly known as: EFFEXOR-XR TAKE 1 CAPSULE BY MOUTH  DAILY   Xarelto 20 MG Tabs tablet Generic drug: rivaroxaban TAKE 1 TABLET BY MOUTH  DAILY WITH SUPPER        Signature:  [name], MD [place] Pager - [phone] 12/30/2020, 4:24 PM

## 2020-12-30 NOTE — Patient Instructions (Signed)
Will schedule CT chest for April 2023  Follow up in 1 year

## 2021-01-12 ENCOUNTER — Encounter (HOSPITAL_COMMUNITY): Payer: Self-pay | Admitting: Gastroenterology

## 2021-01-12 ENCOUNTER — Other Ambulatory Visit: Payer: Self-pay

## 2021-01-12 ENCOUNTER — Telehealth: Payer: Self-pay | Admitting: Internal Medicine

## 2021-01-12 ENCOUNTER — Telehealth: Payer: Self-pay | Admitting: Cardiology

## 2021-01-12 DIAGNOSIS — G4733 Obstructive sleep apnea (adult) (pediatric): Secondary | ICD-10-CM | POA: Diagnosis not present

## 2021-01-12 NOTE — Telephone Encounter (Signed)
He states he has never been off of it for that length of time and worries it is too long.Pt was advised to contact his PCP regarding length of time off his Xarelto.

## 2021-01-12 NOTE — Telephone Encounter (Signed)
   Patient wants to know if it is ok to stay off Xarelto for 5 -7 prior to procedure  He has concerns about being of medication that long. Scheduled for 7/15 procedure

## 2021-01-12 NOTE — Telephone Encounter (Signed)
Patient called in regarding XARELTO 20 MG TABS tablet. He has a colonscopy being down on Friday and the instruction tell him to not take medication for 5-7 days. And he is worry about it. Please advise

## 2021-01-12 NOTE — Telephone Encounter (Signed)
LVM for return call.   Per Pre-op clearance on 6/22: "He takes xarelto for PE/DVT managed by PCP. Please reach out to PCP for xarelto hold +/- bridging."

## 2021-01-12 NOTE — Telephone Encounter (Signed)
Aaron [name] is returning Aaron [name]'s call. Please advise.

## 2021-01-13 NOTE — Telephone Encounter (Signed)
Called pt, LVM.   

## 2021-01-15 ENCOUNTER — Ambulatory Visit (HOSPITAL_COMMUNITY): Admission: RE | Admit: 2021-01-15 | Payer: Medicare Other | Source: Home / Self Care | Admitting: Gastroenterology

## 2021-01-15 SURGERY — ESOPHAGOGASTRODUODENOSCOPY (EGD) WITH PROPOFOL
Anesthesia: Monitor Anesthesia Care

## 2021-01-19 DIAGNOSIS — L600 Ingrowing nail: Secondary | ICD-10-CM | POA: Diagnosis not present

## 2021-01-19 DIAGNOSIS — M79674 Pain in right toe(s): Secondary | ICD-10-CM | POA: Diagnosis not present

## 2021-01-19 DIAGNOSIS — L603 Nail dystrophy: Secondary | ICD-10-CM | POA: Diagnosis not present

## 2021-01-19 DIAGNOSIS — M79675 Pain in left toe(s): Secondary | ICD-10-CM | POA: Diagnosis not present

## 2021-01-19 DIAGNOSIS — I739 Peripheral vascular disease, unspecified: Secondary | ICD-10-CM | POA: Diagnosis not present

## 2021-01-19 DIAGNOSIS — B351 Tinea unguium: Secondary | ICD-10-CM | POA: Diagnosis not present

## 2021-01-31 ENCOUNTER — Other Ambulatory Visit: Payer: Self-pay | Admitting: Family

## 2021-02-12 ENCOUNTER — Other Ambulatory Visit: Payer: Self-pay | Admitting: Family

## 2021-02-12 ENCOUNTER — Telehealth: Payer: Self-pay

## 2021-02-12 DIAGNOSIS — I10 Essential (primary) hypertension: Secondary | ICD-10-CM

## 2021-02-12 NOTE — Telephone Encounter (Signed)
Surgical clearance placed on PCPs desk for review and signature.

## 2021-02-12 NOTE — Telephone Encounter (Signed)
-----   Message from Marguarite Arbour sent at 02/12/2021 10:51 AM EDT ----- Surgical clearance

## 2021-02-16 NOTE — Telephone Encounter (Signed)
Surgical clearance has been signed and faxed back.

## 2021-02-19 NOTE — Progress Notes (Signed)
Cardiology Office Note:    Date:  02/22/2021   ID:  Aaron [name], DOB Nov 17, 1945, MRN [id number]  PCP:  Aaron Lima, MD  Cardiologist:  Aaron Gintz Martinique, MD   Referring MD: Aaron [name],*   Chief Complaint  Patient presents with   Coronary Artery Disease     History of Present Illness:    Aaron [name] is a 75 y.o. male with a hx of hypertension, coronary artery disease, recurrent PE/DVTs on xarelto, GERD, hyperlipidemia, and obesity.  He has a history of stenting to his mid LAD in 2005 following a nuclear stress test that showed evidence of anterior apical ischemia.  Follow-up Myoview in 2014 was normal.  He was anticoagulated on Xarelto.  He did have repeat Myoview in Nov 2021 that was normal.   On follow up today he reports he is doing very well.  He is limited s/p bilateral TKR but still does all his own yard work.   Since last visit was started on Zetia 10 mg daily. This did result in some improvement in LDL. He denies any chest pain or dyspnea.   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"   Heart murmur    Hyperlipidemia    Hypertension    Insomnia    Osteoarthritis    PONV (postoperative nausea and vomiting)    2006 - knee surgery   Pre-diabetes    Psoriasis    PTSD (post-traumatic stress disorder)    Pulmonary embolism ([place]) ~ 03/2006   bilaterally   Seasonal allergies    Shortness of breath 06/08/11   "w/exertion; that's why I'm here"   Sleep apnea, obstructive    Umbilical hernia     Past Surgical History:  Procedure Laterality Date   CARDIAC CATHETERIZATION     carpel tunnel release Bilateral ~ 06/2005/~ 08/2005    right/left   COLONOSCOPY W/ BIOPSIES AND POLYPECTOMY     CORONARY ANGIOPLASTY     CORONARY STENT PLACEMENT  02/13/2004   1   ENUCLEATION Right    age 71   EYE SURGERY     age 62; "ruptured  right pupil"   EYE SURGERY     age 35   [facility]   placed artificial right eye   INGUINAL HERNIA REPAIR Right [place] ARTHROSCOPY  ~ 06/2005   right   MULTIPLE TOOTH EXTRACTIONS     spinal injections  2006-2007   "maybe 10 injections for herniated discs"   TONSILLECTOMY     "when I was real young"   TOTAL KNEE ARTHROPLASTY Left 10/09/2015   Procedure: TOTAL KNEE ARTHROPLASTY;  Surgeon: Aaron Leitz, MD;  Location: [place];  Service: Orthopedics;  Laterality: Left;   TOTAL KNEE ARTHROPLASTY Right 04/04/2016   Procedure: TOTAL KNEE ARTHROPLASTY;  Surgeon: Aaron Leitz, MD;  Location: [place];  Service: Orthopedics;  Laterality: Right;    Current Medications: Current Meds  Medication Sig   ALPRAZolam (XANAX) 1 MG  tablet Take 1 tablet (1 mg total) by mouth at bedtime as needed for anxiety or sleep. (Patient taking differently: Take 1 mg by mouth at bedtime as needed for anxiety.)   amLODipine (NORVASC) 5 MG tablet TAKE 1 TABLET (5 MG TOTAL) BY MOUTH DAILY. AT BEDTIME   atorvastatin (LIPITOR) 80 MG tablet Take 1 tablet (80 mg total) by mouth daily.   Carboxymethylcellulose Sodium 1 % GEL Place 1 application into both eyes at bedtime as needed (Dry eye).   ezetimibe (ZETIA) 10 MG tablet Take 1 tablet (10 mg total) by mouth daily.   ferrous sulfate 325 (65 FE) MG EC tablet TAKE 1 TABLET BY MOUTH EVERY DAY WITH BREAKFAST (Patient taking differently: Take 325 mg by mouth daily with breakfast.)   melatonin 5 MG TABS Take 10 mg by mouth at bedtime.   pantoprazole (PROTONIX) 40 MG tablet TAKE 1 TABLET BY MOUTH EVERY DAY   polyethylene glycol (MIRALAX / GLYCOLAX) 17 g packet Take 17 g by mouth daily. With coffee   telmisartan-hydrochlorothiazide (MICARDIS HCT) 80-25  MG tablet TAKE 1 TABLET BY MOUTH EVERY DAY (Patient taking differently: Take 1 tablet by mouth daily.)   traMADol (ULTRAM) 50 MG tablet Take 2 tablets (100 mg total) by mouth every 6 (six) hours as needed. for pain (Patient taking differently: Take 100 mg by mouth every 6 (six) hours as needed for moderate pain or severe pain.)   traZODone (DESYREL) 50 MG tablet Take 1.5 tablets (75 mg total) by mouth at bedtime as needed. (Patient taking differently: Take 75 mg by mouth at bedtime.)   venlafaxine XR (EFFEXOR-XR) 75 MG 24 hr capsule TAKE 1 CAPSULE BY MOUTH  DAILY (Patient taking differently: Take 75 mg by mouth daily with supper.)   XARELTO 20 MG TABS tablet TAKE 1 TABLET BY MOUTH  DAILY WITH SUPPER (Patient taking differently: Take 20 mg by mouth daily with supper.)     Allergies:   Wellbutrin [bupropion] and Amoxicillin   Social History   Socioeconomic History   Marital status: Married    Spouse name: Not on file   Number of children: 3   Years of education: Not on file   Highest education level: Not on file  Occupational History   Occupation: building maintenance    Comment: disabled  Tobacco Use   Smoking status: Former    Packs/day: 1.50    Years: 25.00    Pack years: 37.50    Types: Cigarettes    Quit date: 07/10/1995    Years since quitting: 25.6   Smokeless tobacco: Never  Vaping Use   Vaping Use: Never used  Substance and Sexual Activity   Alcohol use: No   Drug use: No   Sexual activity: Not on file  Other Topics Concern   Not on file  Social History Narrative   Former Curator, 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   Recent stress, currently undergoing financial difficulties.   Social Determinants of Health   Financial Resource Strain: Low Risk    Difficulty of Paying Living Expenses: Not hard at all  Food Insecurity: No Food Insecurity   Worried About Charity fundraiser in the Last Year: Never true   [place] in the Last Year: Never true   Transportation Needs: No Transportation Needs   Lack of Transportation (Medical): No   Lack of Transportation (Non-Medical): No  Physical Activity: Inactive   Days of Exercise per Week: 0 days   Minutes of  Exercise per Session: 0 min  Stress: No Stress Concern Present   Feeling of Stress : Not at all  Social Connections: Socially Integrated   Frequency of Communication with Friends and Family: More than three times a week   Frequency of Social Gatherings with Friends and Family: Once a week   Attends Religious Services: 1 to 4 times per year   Active Member of [employer] or Organizations: No   Attends Music therapist: 1 to 4 times per year   Marital Status: Married     Family History: The patient's family history includes Asthma in his brother, father, and sister; Breast cancer in his sister; Emphysema in his father; Liver cancer in his sister.  ROS:   Please see the history of present illness.     All other systems reviewed and are negative.  EKGs/Labs/Other Studies Reviewed:    The following studies were reviewed today:  Myoview 05/22/20: Study Highlights    The left ventricular ejection fraction is normal (55-65%). Nuclear stress EF: 56%. There was no ST segment deviation noted during stress. No T wave inversion was noted during stress. The study is normal. This is a low risk study.   Low risk stress nuclear study with normal perfusion and normal left ventricular regional and global systolic function.      EKG:  EKG is not ordered today.    Recent Labs: 06/15/2020: BUN 21; Creatinine 1.24; Potassium 4.2; Sodium 139 10/12/2020: TSH 2.81 12/17/2020: ALT 10 12/23/2020: Hemoglobin 11.4; Platelet Count 218  Recent Lipid Panel    Component Value Date/Time   CHOL 151 12/17/2020 1017   TRIG 124 12/17/2020 1017   HDL 40 12/17/2020 1017   CHOLHDL 3.8 12/17/2020 1017   CHOLHDL 4 07/22/2019 1450   VLDL 46.2 (H) 07/22/2019 1450   LDLCALC 89 12/17/2020 1017    LDLDIRECT 105.0 07/22/2019 1450    Physical Exam:    VS:  BP (!) 150/60 (BP Location: Left Arm)   Pulse 68   Ht '5\' 6"'$  (1.676 m)   Wt 252 lb 3.2 oz (114.4 kg)   SpO2 98%   BMI 40.71 kg/m     Wt Readings from Last 3 Encounters:  02/22/21 252 lb 3.2 oz (114.4 kg)  12/30/20 256 lb 2 oz (116.2 kg)  12/24/20 257 lb (116.6 kg)     GEN: Well nourished, obese,  in no acute distress HEENT: Normal NECK: No JVD; No carotid bruits CARDIAC: RRR, no murmurs, rubs, gallops RESPIRATORY:  Clear to auscultation without rales, wheezing or rhonchi  ABDOMEN: Soft, non-tender, non-distended MUSCULOSKELETAL:  No edema; No deformity  SKIN: Warm and dry NEUROLOGIC:  Alert and oriented x 3 PSYCHIATRIC:  Normal affect   ASSESSMENT:    1. Coronary artery disease involving native coronary artery of native heart without angina pectoris   2. Pure hypercholesterolemia     PLAN:    In order of problems listed above:  1. Coronary artery disease involving native coronary artery of native heart  Remote stenting of the LAD in 2005. Not on ASA due to need for anticoagulation. Continue statin.  Myoview study in Nov 2021 was normal.   2. Essential hypertension Fairly well controlled on current regimen.  3. Other pulmonary embolism without acute cor pulmonale, unspecified chronicity (HCC) Deep vein thrombosis (DVT) of proximal lower extremity, unspecified chronicity, unspecified laterality ([place]) Long term current use of anticoagulant He is compliant on Xarelto.   4. Hypercholesterolemia. Goal LDL < 70. He is on maximal  statin dose and Zetia. LDL improved to 89.    Follow-up in 1 year   Medication Adjustments/Labs and Tests Ordered: Current medicines are reviewed at length with the patient today.  Concerns regarding medicines are outlined above.  No orders of the defined types were placed in this encounter.  No orders of the defined types were placed in this encounter.   Signed, Lindaann Gradilla Martinique,  MD  02/22/2021 2:54 PM    [facility]

## 2021-02-22 ENCOUNTER — Encounter: Payer: Self-pay | Admitting: Cardiology

## 2021-02-22 ENCOUNTER — Other Ambulatory Visit: Payer: Self-pay

## 2021-02-22 ENCOUNTER — Ambulatory Visit: Payer: Medicare Other | Admitting: Cardiology

## 2021-02-22 VITALS — BP 150/60 | HR 68 | Ht 66.0 in | Wt 252.2 lb

## 2021-02-22 DIAGNOSIS — I10 Essential (primary) hypertension: Secondary | ICD-10-CM

## 2021-02-22 DIAGNOSIS — E7800 Pure hypercholesterolemia, unspecified: Secondary | ICD-10-CM | POA: Diagnosis not present

## 2021-02-22 DIAGNOSIS — I2510 Atherosclerotic heart disease of native coronary artery without angina pectoris: Secondary | ICD-10-CM | POA: Diagnosis not present

## 2021-03-02 ENCOUNTER — Telehealth: Payer: Self-pay | Admitting: Internal Medicine

## 2021-03-02 NOTE — Telephone Encounter (Signed)
R/s 9/20 appt per provider PAL. Called and left msg.

## 2021-03-07 ENCOUNTER — Other Ambulatory Visit: Payer: Self-pay | Admitting: Family

## 2021-03-13 ENCOUNTER — Other Ambulatory Visit: Payer: Self-pay | Admitting: Family

## 2021-03-22 ENCOUNTER — Other Ambulatory Visit: Payer: Medicare Other

## 2021-03-22 ENCOUNTER — Ambulatory Visit: Payer: Medicare Other | Admitting: Internal Medicine

## 2021-03-23 ENCOUNTER — Other Ambulatory Visit: Payer: Medicare Other

## 2021-03-23 ENCOUNTER — Ambulatory Visit: Payer: Medicare Other | Admitting: Internal Medicine

## 2021-03-25 ENCOUNTER — Other Ambulatory Visit: Payer: Self-pay | Admitting: Family

## 2021-04-07 DIAGNOSIS — H40012 Open angle with borderline findings, low risk, left eye: Secondary | ICD-10-CM | POA: Diagnosis not present

## 2021-04-07 DIAGNOSIS — Z961 Presence of intraocular lens: Secondary | ICD-10-CM | POA: Diagnosis not present

## 2021-04-07 DIAGNOSIS — Z970 Presence of artificial eye: Secondary | ICD-10-CM | POA: Diagnosis not present

## 2021-04-11 ENCOUNTER — Other Ambulatory Visit: Payer: Self-pay | Admitting: Internal Medicine

## 2021-04-11 DIAGNOSIS — M170 Bilateral primary osteoarthritis of knee: Secondary | ICD-10-CM

## 2021-04-14 DIAGNOSIS — G4733 Obstructive sleep apnea (adult) (pediatric): Secondary | ICD-10-CM | POA: Diagnosis not present

## 2021-04-19 DIAGNOSIS — B351 Tinea unguium: Secondary | ICD-10-CM | POA: Diagnosis not present

## 2021-04-19 DIAGNOSIS — M79674 Pain in right toe(s): Secondary | ICD-10-CM | POA: Diagnosis not present

## 2021-04-19 DIAGNOSIS — L603 Nail dystrophy: Secondary | ICD-10-CM | POA: Diagnosis not present

## 2021-04-19 DIAGNOSIS — L600 Ingrowing nail: Secondary | ICD-10-CM | POA: Diagnosis not present

## 2021-04-19 DIAGNOSIS — M79675 Pain in left toe(s): Secondary | ICD-10-CM | POA: Diagnosis not present

## 2021-04-19 DIAGNOSIS — I739 Peripheral vascular disease, unspecified: Secondary | ICD-10-CM | POA: Diagnosis not present

## 2021-04-28 ENCOUNTER — Inpatient Hospital Stay: Payer: Medicare Other | Attending: Internal Medicine

## 2021-04-28 ENCOUNTER — Other Ambulatory Visit: Payer: Self-pay

## 2021-04-28 ENCOUNTER — Encounter: Payer: Self-pay | Admitting: Internal Medicine

## 2021-04-28 ENCOUNTER — Inpatient Hospital Stay: Payer: Medicare Other | Admitting: Internal Medicine

## 2021-04-28 VITALS — BP 159/58 | HR 72 | Temp 98.7°F | Resp 17 | Ht 66.0 in | Wt 252.3 lb

## 2021-04-28 DIAGNOSIS — Z79899 Other long term (current) drug therapy: Secondary | ICD-10-CM | POA: Diagnosis not present

## 2021-04-28 DIAGNOSIS — Z86711 Personal history of pulmonary embolism: Secondary | ICD-10-CM | POA: Diagnosis not present

## 2021-04-28 DIAGNOSIS — Z7901 Long term (current) use of anticoagulants: Secondary | ICD-10-CM | POA: Insufficient documentation

## 2021-04-28 DIAGNOSIS — D500 Iron deficiency anemia secondary to blood loss (chronic): Secondary | ICD-10-CM | POA: Insufficient documentation

## 2021-04-28 LAB — CBC WITH DIFFERENTIAL (CANCER CENTER ONLY)
Abs Immature Granulocytes: 0.05 10*3/uL (ref 0.00–0.07)
Basophils Absolute: 0.1 10*3/uL (ref 0.0–0.1)
Basophils Relative: 1 %
Eosinophils Absolute: 0.2 10*3/uL (ref 0.0–0.5)
Eosinophils Relative: 3 %
HCT: 35 % — ABNORMAL LOW (ref 39.0–52.0)
Hemoglobin: 11.7 g/dL — ABNORMAL LOW (ref 13.0–17.0)
Immature Granulocytes: 1 %
Lymphocytes Relative: 20 %
Lymphs Abs: 1.4 10*3/uL (ref 0.7–4.0)
MCH: 28.7 pg (ref 26.0–34.0)
MCHC: 33.4 g/dL (ref 30.0–36.0)
MCV: 86 fL (ref 80.0–100.0)
Monocytes Absolute: 0.7 10*3/uL (ref 0.1–1.0)
Monocytes Relative: 10 %
Neutro Abs: 4.6 10*3/uL (ref 1.7–7.7)
Neutrophils Relative %: 65 %
Platelet Count: 229 10*3/uL (ref 150–400)
RBC: 4.07 MIL/uL — ABNORMAL LOW (ref 4.22–5.81)
RDW: 13.2 % (ref 11.5–15.5)
WBC Count: 7 10*3/uL (ref 4.0–10.5)
nRBC: 0 % (ref 0.0–0.2)

## 2021-04-28 LAB — IRON AND TIBC
Iron: 42 ug/dL (ref 42–163)
Saturation Ratios: 14 % — ABNORMAL LOW (ref 20–55)
TIBC: 298 ug/dL (ref 202–409)
UIBC: 256 ug/dL (ref 117–376)

## 2021-04-28 LAB — FERRITIN: Ferritin: 21 ng/mL — ABNORMAL LOW (ref 24–336)

## 2021-04-28 NOTE — Progress Notes (Signed)
[place] [phone]   [phone]  OFFICE PROGRESS NOTE  [name], MD [place] [id number]  DIAGNOSIS: Microcytic anemia secondary to iron deficiency  PRIOR THERAPY: None  CURRENT THERAPY: Ferrous sulfate 325 mg p.o. daily.  INTERVAL HISTORY: Aaron [name] 75 y.o. male returns to the clinic today for follow-up visit.  The patient is feeling fine today with no concerning complaints.  He denied having any current chest pain, shortness of breath, cough or hemoptysis.  He denied having any fatigue or weakness.  He has no nausea, vomiting, diarrhea or constipation.  He has no headache or visual changes.  He continues to tolerate his treatment with ferrous sulfate fairly well.  He also takes MiraLAX every morning because of history of constipation.  He is here today for evaluation and repeat CBC, iron study and ferritin.  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  MEDICATIONS:  Current Outpatient Medications  Medication Sig Dispense Refill   ALPRAZolam (XANAX) 1 MG tablet Take 1 tablet (1 mg total)  by mouth at bedtime as needed for anxiety or sleep. (Patient taking differently: Take 1 mg by mouth at bedtime as needed for anxiety.) 90 tablet 1   amLODipine (NORVASC) 5 MG tablet TAKE 1 TABLET (5 MG TOTAL) BY MOUTH DAILY. AT BEDTIME 90 tablet 0   atorvastatin (LIPITOR) 80 MG tablet Take 1 tablet (80 mg total) by mouth daily. 90 tablet 3   Carboxymethylcellulose Sodium 1 % GEL Place 1 application into both eyes at bedtime as needed (Dry eye).     ezetimibe (ZETIA) 10 MG tablet Take 1 tablet (10 mg total) by mouth daily. 90 tablet 3   ferrous sulfate 325 (65 FE) MG EC tablet TAKE 1 TABLET BY MOUTH EVERY DAY WITH BREAKFAST (Patient taking differently: Take 325 mg by mouth daily with breakfast.) 90 tablet 3   melatonin 5 MG TABS Take 10 mg by mouth at bedtime.     pantoprazole (PROTONIX) 40 MG tablet TAKE 1 TABLET BY MOUTH EVERY DAY 90 tablet 1   polyethylene glycol (MIRALAX / GLYCOLAX) 17 g packet Take 17 g by mouth daily. With coffee     telmisartan-hydrochlorothiazide (MICARDIS HCT) 80-25 MG tablet TAKE 1 TABLET BY MOUTH EVERY DAY 90 tablet 0   traMADol (ULTRAM) 50 MG tablet Take 2 tablets (100 mg total) by mouth every 6 (  six) hours as needed for moderate pain or severe pain. 360 tablet 1   traZODone (DESYREL) 50 MG tablet Take 1.5 tablets (75 mg total) by mouth at bedtime as needed. (Patient taking differently: Take 75 mg by mouth at bedtime.) 135 tablet 1   venlafaxine XR (EFFEXOR-XR) 75 MG 24 hr capsule Take 1 capsule (75 mg total) by mouth daily with supper. 90 capsule 1   XARELTO 20 MG TABS tablet TAKE 1 TABLET BY MOUTH  DAILY WITH SUPPER (Patient taking differently: Take 20 mg by mouth daily with supper.) 90 tablet 3   No current facility-administered medications for this visit.    SURGICAL HISTORY:  Past Surgical History:  Procedure Laterality Date   CARDIAC CATHETERIZATION     carpel tunnel release Bilateral ~ 06/2005/~ 08/2005   right/left   COLONOSCOPY W/ BIOPSIES AND POLYPECTOMY      CORONARY ANGIOPLASTY     CORONARY STENT PLACEMENT  02/13/2004   1   ENUCLEATION Right    age 72   EYE SURGERY     age 37; "ruptured  right pupil"   EYE SURGERY     age 71   [place]   placed artificial right eye   INGUINAL HERNIA REPAIR Right 1983   INGUINAL HERNIA REPAIR Left 1985   KNEE ARTHROSCOPY  ~ 06/2005   right   MULTIPLE TOOTH EXTRACTIONS     spinal injections  2006-2007   "maybe 10 injections for herniated discs"   TONSILLECTOMY     "when I was real young"   TOTAL KNEE ARTHROPLASTY Left 10/09/2015   Procedure: TOTAL KNEE ARTHROPLASTY;  Surgeon: Dorna Leitz, MD;  Location: [place];  Service: Orthopedics;  Laterality: Left;   TOTAL KNEE ARTHROPLASTY Right 04/04/2016   Procedure: TOTAL KNEE ARTHROPLASTY;  Surgeon: Dorna Leitz, MD;  Location: [place];  Service: Orthopedics;  Laterality: Right;    REVIEW OF SYSTEMS:  A comprehensive review of systems was negative.   PHYSICAL EXAMINATION: General appearance: alert, cooperative, and no distress Head: Normocephalic, without obvious abnormality, atraumatic Neck: no adenopathy, no JVD, supple, symmetrical, trachea midline, and thyroid not enlarged, symmetric, no tenderness/mass/nodules Lymph nodes: Cervical, supraclavicular, and axillary nodes normal. Resp: clear to auscultation bilaterally Back: symmetric, no curvature. ROM normal. No CVA tenderness. Cardio: regular rate and rhythm, S1, S2 normal, no murmur, click, rub or gallop GI: soft, non-tender; bowel sounds normal; no masses,  no organomegaly Extremities: extremities normal, atraumatic, no cyanosis or edema  ECOG PERFORMANCE STATUS: 1 - Symptomatic but completely ambulatory  Blood pressure (!) 159/58, pulse 72, temperature 98.7 F (37.1 C), temperature source Oral, resp. rate 17, height 5\' 6"  (1.676 m), weight 252 lb 4.8 oz (114.4 kg), SpO2 95 %.  LABORATORY DATA: Lab Results  Component Value Date   WBC 7.0 04/28/2021   HGB 11.7 (L) 04/28/2021   HCT 35.0 (L)  04/28/2021   MCV 86.0 04/28/2021   PLT 229 04/28/2021      Chemistry      Component Value Date/Time   NA 139 06/15/2020 1306   K 4.2 06/15/2020 1306   CL 106 06/15/2020 1306   CO2 25 06/15/2020 1306   BUN 21 06/15/2020 1306   CREATININE 1.24 06/15/2020 1306   CREATININE 1.16 02/10/2020 1532      Component Value Date/Time   CALCIUM 9.0 06/15/2020 1306   ALKPHOS 98 12/17/2020 1017   AST 10 12/17/2020 1017   AST 17 06/15/2020 1306   ALT 10 12/17/2020 1017  ALT 12 06/15/2020 1306   BILITOT 0.3 12/17/2020 1017   BILITOT 0.4 06/15/2020 1306       RADIOGRAPHIC STUDIES: No results found.  ASSESSMENT AND PLAN: This is a very pleasant 75 years old white male with history of iron deficiency anemia secondary to chronic gastrointestinal blood loss. The patient is currently on oral iron tablet with ferrous sulfate 325 mg p.o. daily and tolerating this treatment well. He continues to tolerate this treatment well with no concerning adverse effects. Repeat CBC today showed further improvement in his hemoglobin and hematocrit.  His hemoglobin today is 11.7.  Iron studies showed normal iron level of 42 with iron saturation of 14%.  Ferritin level was low at 21. I recommended for the patient to continue on the oral iron tablets with ferrous sulfate as planned. I will see him back for follow-up visit in 6 months for evaluation and repeat blood work. He was advised to call immediately if he has any other concerning symptoms in the interval. The patient voices understanding of current disease status and treatment options and is in agreement with the current care plan.  All questions were answered. The patient knows to call the clinic with any problems, questions or concerns. We can certainly see the patient much sooner if necessary.  The total time spent in the appointment was 20 minutes.  Disclaimer: This note was dictated with voice recognition software. Similar sounding words can  inadvertently be transcribed and may not be corrected upon review.

## 2021-04-30 ENCOUNTER — Telehealth: Payer: Self-pay | Admitting: Internal Medicine

## 2021-04-30 NOTE — Telephone Encounter (Signed)
Left message with follow-up appointment per 10/26 los.

## 2021-05-18 ENCOUNTER — Other Ambulatory Visit: Payer: Self-pay | Admitting: Internal Medicine

## 2021-05-18 DIAGNOSIS — I10 Essential (primary) hypertension: Secondary | ICD-10-CM

## 2021-06-08 ENCOUNTER — Telehealth: Payer: Self-pay | Admitting: Family

## 2021-06-15 ENCOUNTER — Other Ambulatory Visit: Payer: Self-pay | Admitting: Internal Medicine

## 2021-06-15 DIAGNOSIS — F411 Generalized anxiety disorder: Secondary | ICD-10-CM

## 2021-06-15 DIAGNOSIS — I2510 Atherosclerotic heart disease of native coronary artery without angina pectoris: Secondary | ICD-10-CM

## 2021-06-15 DIAGNOSIS — E785 Hyperlipidemia, unspecified: Secondary | ICD-10-CM

## 2021-06-15 MED ORDER — ATORVASTATIN CALCIUM 80 MG PO TABS: 80.0000 mg | ORAL_TABLET | Freq: Every day | ORAL | 0 refills | Status: DC

## 2021-06-15 NOTE — Telephone Encounter (Signed)
1.Medication Requested: atorvastatin (LIPITOR) 80 MG tablet  2. Pharmacy (Name, [place], [facility]): [facility] - [place], [place]  Phone:  [phone] Fax:  [phone]   3. On Med List: yes  4. Last Visit with PCP: 06.16.22  5. Next visit date with PCP: n/a   Agent: Please be advised that RX refills may take up to 3 business days. We ask that you follow-up with your pharmacy.

## 2021-06-29 ENCOUNTER — Other Ambulatory Visit: Payer: Self-pay | Admitting: Internal Medicine

## 2021-07-16 DIAGNOSIS — G4733 Obstructive sleep apnea (adult) (pediatric): Secondary | ICD-10-CM | POA: Diagnosis not present

## 2021-07-19 ENCOUNTER — Telehealth: Payer: Self-pay

## 2021-07-19 NOTE — Telephone Encounter (Signed)
Patient requesting Handicap placer form be completed and call when ready.  [phone]

## 2021-07-19 NOTE — Telephone Encounter (Signed)
Form has been completed and given to PCP for signature.  

## 2021-07-20 NOTE — Telephone Encounter (Signed)
Patient called in  Patient says he would like for handicap placard form to be mailed to address on file  Please let patient know when form has been mailed [phone]

## 2021-07-20 NOTE — Telephone Encounter (Signed)
LVM stating that form was completed.  Inquiring if he would like to pick it up or have it mailed to the home.

## 2021-07-20 NOTE — Telephone Encounter (Signed)
Form is being mailed per pr request.

## 2021-07-26 ENCOUNTER — Other Ambulatory Visit: Payer: Self-pay | Admitting: Internal Medicine

## 2021-07-28 ENCOUNTER — Telehealth: Payer: Self-pay | Admitting: Family

## 2021-07-28 ENCOUNTER — Other Ambulatory Visit: Payer: Self-pay | Admitting: Internal Medicine

## 2021-07-28 DIAGNOSIS — I2699 Other pulmonary embolism without acute cor pulmonale: Secondary | ICD-10-CM

## 2021-07-28 MED ORDER — XARELTO 20 MG PO TABS: 20.0000 mg | ORAL_TABLET | Freq: Every day | ORAL | 0 refills | Status: DC

## 2021-07-28 NOTE — Telephone Encounter (Signed)
1.Medication Requested: XARELTO 20 MG TABS tablet  2. Pharmacy (Name, [place], [place]): [id number] - [place], [place] - [place]  Phone:  [phone] Fax:  [phone]   3. On Med List: yes  4. Last Visit with PCP: 06.16.22  5. Next visit date with PCP: 02.03.22 Jenny Reichmann)   Agent: Please be advised that RX refills may take up to 3 business days. We ask that you follow-up with your pharmacy.

## 2021-08-05 NOTE — Patient Instructions (Addendum)
  Blood work was ordered.     Medications changes include :     Your prescription(s) have been submitted to your pharmacy. Please take as directed and contact our office if you believe you are having problem(s) with the medication(s).   A referral was ordered for        Someone from their office will call you to schedule an appointment.    Please followup in 6 months  

## 2021-08-05 NOTE — Progress Notes (Signed)
Subjective:    Patient ID: Aaron [name], male    DOB: [birth year], 76 y.o.   MRN: [id number]  This visit occurred during the SARS-CoV-2 public health emergency.  Safety protocols were in place, including screening questions prior to the visit, additional usage of staff PPE, and extensive cleaning of exam room while observing appropriate contact time as indicated for disinfecting solutions.     HPI The patient is here for follow up of their chronic medical problems, including htn, hld, prediabetes, GERD, iron def anemia, sleep diff, anxiety, depression, chronic lower back apin    Medications and allergies reviewed with patient and updated if appropriate.  Patient Active Problem List   Diagnosis Date Noted   Encounter for general adult medical examination with abnormal findings 12/17/2020   Primary osteoarthritis of both knees 12/17/2020   GAD (generalized anxiety disorder) 12/17/2020   History of colonic polyps 10/07/2020   Flatulence, eructation and gas pain 10/07/2020   Diverticular disease of colon 10/07/2020   Constipation 10/07/2020   Gastroesophageal reflux disease 10/07/2020   Iron deficiency anemia 06/15/2020   History of DVT (deep vein thrombosis) 05/06/2019   History of pulmonary embolus (PE) 05/06/2019   Pre-diabetes 05/18/2018   Chronic low back pain 11/26/2017   Status post total knee replacement 08/15/2016   Morbid obesity ([place]) 08/01/2016   Primary osteoarthritis of right knee 04/04/2016   DVT (deep venous thrombosis) ([place]) 11/09/2015   Primary osteoarthritis of left knee 10/09/2015   Generalized anxiety disorder 08/20/2015   Routine general medical examination at a health care facility 02/11/2015   Medicare annual wellness visit, subsequent 02/11/2015   Long term current use of anticoagulant 06/14/2011   Coronary artery disease    Hypertension    Hyperlipidemia    Depression    GERD (gastroesophageal reflux disease)     Insomnia    Sleep apnea, obstructive    Pulmonary embolism ([place])     Current Outpatient Medications on File Prior to Visit  Medication Sig Dispense Refill   atorvastatin (LIPITOR) 80 MG tablet Take 1 tablet (80 mg total) by mouth daily. 90 tablet 0   Carboxymethylcellulose Sodium 1 % GEL Place 1 application into both eyes at bedtime as needed (Dry eye).     ezetimibe (ZETIA) 10 MG tablet Take 1 tablet (10 mg total) by mouth daily. 90 tablet 3   ferrous sulfate 325 (65 FE) MG EC tablet TAKE 1 TABLET BY MOUTH EVERY DAY WITH BREAKFAST (Patient taking differently: Take 325 mg by mouth daily with breakfast.) 90 tablet 3   melatonin 5 MG TABS Take 10 mg by mouth at bedtime.     pantoprazole (PROTONIX) 40 MG tablet TAKE 1 TABLET BY MOUTH EVERY DAY 90 tablet 1   polyethylene glycol (MIRALAX / GLYCOLAX) 17 g packet Take 17 g by mouth daily. With coffee     No current facility-administered medications on file prior to visit.    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"   Heart murmur  Hyperlipidemia    Hypertension    Insomnia    Osteoarthritis    PONV (postoperative nausea and vomiting)    2006 - knee surgery   Pre-diabetes    Psoriasis    PTSD (post-traumatic stress disorder)    Pulmonary embolism ([place]) ~ 03/2006   bilaterally   Seasonal allergies    Shortness of breath 06/08/11   "w/exertion; that's why I'm here"   Sleep apnea, obstructive    Umbilical hernia     Past Surgical History:  Procedure Laterality Date   CARDIAC CATHETERIZATION     carpel tunnel release Bilateral ~ 06/2005/~ 08/2005   right/left    COLONOSCOPY W/ BIOPSIES AND POLYPECTOMY     CORONARY ANGIOPLASTY     CORONARY STENT PLACEMENT  02/13/2004   1   ENUCLEATION Right    age 81   EYE SURGERY     age 58; "ruptured  right pupil"   EYE SURGERY     age 47   [place]   placed artificial right eye   INGUINAL HERNIA REPAIR Right [place] Left 1985   KNEE ARTHROSCOPY  ~ 06/2005   right   MULTIPLE TOOTH EXTRACTIONS     spinal injections  2006-2007   "maybe 10 injections for herniated discs"   TONSILLECTOMY     "when I was real young"   TOTAL KNEE ARTHROPLASTY Left 10/09/2015   Procedure: TOTAL KNEE ARTHROPLASTY;  Surgeon: Dorna Leitz, MD;  Location: [place];  Service: Orthopedics;  Laterality: Left;   TOTAL KNEE ARTHROPLASTY Right 04/04/2016   Procedure: TOTAL KNEE ARTHROPLASTY;  Surgeon: Dorna Leitz, MD;  Location: [place];  Service: Orthopedics;  Laterality: Right;    Social History   Socioeconomic History   Marital status: Married    Spouse name: Not on file   Number of children: 3   Years of education: Not on file   Highest education level: Not on file  Occupational History   Occupation: building maintenance    Comment: disabled  Tobacco Use   Smoking status: Former    Packs/day: 1.50    Years: 25.00    Pack years: 37.50    Types: Cigarettes    Quit date: 07/10/1995    Years since quitting: 26.1   Smokeless tobacco: Never  Vaping Use   Vaping Use: Never used  Substance and Sexual Activity   Alcohol use: No   Drug use: No   Sexual activity: Not on file  Other Topics Concern   Not on file  Social History Narrative   Former Curator, 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   Recent stress, currently undergoing financial difficulties.   Social Determinants of Health   Financial Resource Strain: Low Risk    Difficulty of Paying Living Expenses: Not hard at all  Food Insecurity: No Food Insecurity   Worried About Charity fundraiser in the Last Year:  Never true   [place] in the Last Year: Never true  Transportation Needs: No Transportation Needs   Lack of Transportation (Medical): No   Lack of Transportation (Non-Medical): No  Physical Activity: Inactive   Days of Exercise per Week: 0 days   Minutes of Exercise per Session: 0 min  Stress: No Stress Concern Present   Feeling of Stress : Not at all  Social Connections: Socially Integrated   Frequency of Communication with Friends and Family: More than three times a week   Frequency  of Social Gatherings with Friends and Family: Once a week   Attends Religious Services: 1 to 4 times per year   Active Member of [employer] or Organizations: No   Attends Music therapist: 1 to 4 times per year   Marital Status: Married    Family History  Problem Relation Age of Onset   Asthma Father    Emphysema Father    Breast cancer Sister    Liver cancer Sister    Asthma Sister    Asthma Brother     Review of Systems     Objective:  There were no vitals filed for this visit. BP Readings from Last 3 Encounters:  08/10/21 140/68  04/28/21 (!) 159/58  02/22/21 (!) 150/60   Wt Readings from Last 3 Encounters:  08/10/21 259 lb 9.6 oz (117.8 kg)  04/28/21 252 lb 4.8 oz (114.4 kg)  02/22/21 252 lb 3.2 oz (114.4 kg)   There is no height or weight on file to calculate BMI.   Physical Exam    Constitutional: Appears well-developed and well-nourished. No distress.  HENT:  Head: Normocephalic and atraumatic.  Neck: Neck supple. No tracheal deviation present. No thyromegaly present.  No cervical lymphadenopathy Cardiovascular: Normal rate, regular rhythm and normal heart sounds.   No murmur heard. No carotid bruit .  No edema Pulmonary/Chest: Effort normal and breath sounds normal. No respiratory distress. No has no wheezes. No rales.  Skin: Skin is warm and dry. Not diaphoretic.  Psychiatric: Normal mood and affect. Behavior is normal.       Assessment & Plan:    See Problem List for Assessment and Plan of chronic medical problems.     This encounter was created in error - please disregard.

## 2021-08-06 ENCOUNTER — Encounter: Payer: Medicare Other | Admitting: Internal Medicine

## 2021-08-06 DIAGNOSIS — R7303 Prediabetes: Secondary | ICD-10-CM

## 2021-08-06 DIAGNOSIS — F411 Generalized anxiety disorder: Secondary | ICD-10-CM

## 2021-08-06 DIAGNOSIS — K219 Gastro-esophageal reflux disease without esophagitis: Secondary | ICD-10-CM

## 2021-08-06 DIAGNOSIS — F32A Depression, unspecified: Secondary | ICD-10-CM

## 2021-08-06 DIAGNOSIS — D509 Iron deficiency anemia, unspecified: Secondary | ICD-10-CM

## 2021-08-06 DIAGNOSIS — E7800 Pure hypercholesterolemia, unspecified: Secondary | ICD-10-CM

## 2021-08-06 DIAGNOSIS — I10 Essential (primary) hypertension: Secondary | ICD-10-CM

## 2021-08-06 DIAGNOSIS — G4700 Insomnia, unspecified: Secondary | ICD-10-CM

## 2021-08-09 ENCOUNTER — Encounter: Payer: Self-pay | Admitting: Internal Medicine

## 2021-08-09 NOTE — Patient Instructions (Addendum)
° ° ° °  Blood work was ordered.     Medications changes include :   stop trazodone and alprazolam.  Take the clonazepam at night.   Increase venlafaxine to 150 mg daily  Your prescription(s) have been submitted to your pharmacy. Please take as directed and contact our office if you believe you are having problem(s) with the medication(s).    Please followup in 6 months

## 2021-08-09 NOTE — Progress Notes (Signed)
Subjective:    Patient ID: Aaron [name], male    DOB: 1945/11/30, 76 y.o.   MRN: [id number]  This visit occurred during the SARS-CoV-2 public health emergency.  Safety protocols were in place, including screening questions prior to the visit, additional usage of staff PPE, and extensive cleaning of exam room while observing appropriate contact time as indicated for disinfecting solutions.     HPI The patient is here for follow up of their chronic medical problems, including htn, hld, prediabetes, GERD, iron def anemia, sleep diff, anxiety, depression, chronic lower back pain  He lost his brother last week. He died suddenly Sep 01, 2021.  He has been taking more xanax over the past week.    He needs refills.    He takes xanax 1 mg and trazodone 75 mg at night for sleep and it does not work well anymore.  His wife is on clonazepam at night and he wondered about trying that.    He is not exercising regularly.     Medications and allergies reviewed with patient and updated if appropriate.  Patient Active Problem List   Diagnosis Date Noted   Encounter for general adult medical examination with abnormal findings 12/17/2020   Primary osteoarthritis of both knees 12/17/2020   GAD (generalized anxiety disorder) 12/17/2020   History of colonic polyps 10/07/2020   Flatulence, eructation and gas pain 10/07/2020   Diverticular disease of colon 10/07/2020   Constipation 10/07/2020   Gastroesophageal reflux disease 10/07/2020   Iron deficiency anemia 06/15/2020   History of DVT (deep vein thrombosis) 05/06/2019   History of pulmonary embolus (PE) 05/06/2019   Pre-diabetes 05/18/2018   Chronic low back pain 11/26/2017   Status post total knee replacement 08/15/2016   Morbid obesity ([place]) 08/01/2016   Primary osteoarthritis of right knee 04/04/2016   DVT (deep venous thrombosis) ([place]) 11/09/2015   Primary osteoarthritis of left knee 10/09/2015   Generalized anxiety disorder 08/20/2015    Routine general medical examination at a health care facility 02/11/2015   Medicare annual wellness visit, subsequent 02/11/2015   Long term current use of anticoagulant 06/14/2011   Coronary artery disease    Hypertension    Hyperlipidemia    Depression    GERD (gastroesophageal reflux disease)    Insomnia    Sleep apnea, obstructive    Pulmonary embolism ([place])     Current Outpatient Medications on File Prior to Visit  Medication Sig Dispense Refill   ALPRAZolam (XANAX) 1 MG tablet Take 1 tablet (1 mg total) by mouth at bedtime as needed for anxiety. 90 tablet 1   amLODipine (NORVASC) 5 MG tablet TAKE 1 TABLET BY MOUTH DAILY AT BEDTIME 90 tablet 0   atorvastatin (LIPITOR) 80 MG tablet Take 1 tablet (80 mg total) by mouth daily. 90 tablet 0   Carboxymethylcellulose Sodium 1 % GEL Place 1 application into both eyes at bedtime as needed (Dry eye).     ezetimibe (ZETIA) 10 MG tablet Take 1 tablet (10 mg total) by mouth daily. 90 tablet 3   ferrous sulfate 325 (65 FE) MG EC tablet TAKE 1 TABLET BY MOUTH EVERY DAY WITH BREAKFAST (Patient taking differently: Take 325 mg by mouth daily with breakfast.) 90 tablet 3   melatonin 5 MG TABS Take 10 mg by mouth at bedtime.     pantoprazole (PROTONIX) 40 MG tablet TAKE 1 TABLET BY MOUTH EVERY DAY 90 tablet 1   polyethylene glycol (MIRALAX / GLYCOLAX) 17 g packet Take  17 g by mouth daily. With coffee     telmisartan-hydrochlorothiazide (MICARDIS HCT) 80-25 MG tablet TAKE 1 TABLET BY MOUTH EVERY DAY 90 tablet 0   traMADol (ULTRAM) 50 MG tablet Take 2 tablets (100 mg total) by mouth every 6 (six) hours as needed for moderate pain or severe pain. 360 tablet 1   traZODone (DESYREL) 50 MG tablet Take 1.5 tablets (75 mg total) by mouth at bedtime as needed. (Patient taking differently: Take 75 mg by mouth at bedtime.) 135 tablet 1   XARELTO 20 MG TABS tablet Take 1 tablet (20 mg total) by mouth daily with supper. 30 tablet 0   [DISCONTINUED] venlafaxine XR  (EFFEXOR-XR) 75 MG 24 hr capsule Take 1 capsule (75 mg total) by mouth daily with supper. 90 capsule 1   No current facility-administered medications on file prior to visit.    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"   Heart murmur    Hyperlipidemia    Hypertension    Insomnia    Osteoarthritis    PONV (postoperative nausea and vomiting)    2006 - knee surgery   Pre-diabetes    Psoriasis    PTSD (post-traumatic stress disorder)    Pulmonary embolism ([place]) ~ 03/2006   bilaterally   Seasonal allergies    Shortness of breath 06/08/11   "w/exertion; that's why I'm here"   Sleep apnea, obstructive    Umbilical hernia     Past Surgical History:  Procedure Laterality Date   CARDIAC CATHETERIZATION     carpel tunnel release Bilateral ~ 06/2005/~ 08/2005   right/left   COLONOSCOPY W/ BIOPSIES AND POLYPECTOMY     CORONARY ANGIOPLASTY     CORONARY STENT PLACEMENT  02/13/2004   1   ENUCLEATION Right    age 13   EYE SURGERY     age 47; "ruptured  right pupil"   EYE SURGERY     age 4   [place]   placed artificial right eye   INGUINAL HERNIA REPAIR Right [place] ARTHROSCOPY  ~ 06/2005   right   MULTIPLE TOOTH EXTRACTIONS     spinal injections  2006-2007   "maybe 10 injections for herniated discs"   TONSILLECTOMY     "when I was real young"   TOTAL KNEE ARTHROPLASTY Left 10/09/2015   Procedure: TOTAL KNEE ARTHROPLASTY;  Surgeon: Dorna Leitz, MD;  Location: [place];  Service: Orthopedics;  Laterality: Left;   TOTAL KNEE ARTHROPLASTY Right 04/04/2016   Procedure: TOTAL KNEE ARTHROPLASTY;  Surgeon: Dorna Leitz,  MD;  Location: [place];  Service: Orthopedics;  Laterality: Right;    Social History   Socioeconomic History   Marital status: Married    Spouse name: Not on file   Number of children: 3   Years of education: Not on file   Highest education level: Not on file  Occupational History   Occupation: building maintenance    Comment: disabled  Tobacco Use  Smoking status: Former    Packs/day: 1.50    Years: 25.00    Pack years: 37.50    Types: Cigarettes    Quit date: 07/10/1995    Years since quitting: 26.1   Smokeless tobacco: Never  Vaping Use   Vaping Use: Never used  Substance and Sexual Activity   Alcohol use: No   Drug use: No   Sexual activity: Not on file  Other Topics Concern   Not on file  Social History Narrative   Former Curator, 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   Recent stress, currently undergoing financial difficulties.   Social Determinants of Health   Financial Resource Strain: Low Risk    Difficulty of Paying Living Expenses: Not hard at all  Food Insecurity: No Food Insecurity   Worried About Charity fundraiser in the Last Year: Never true   [place] in the Last Year: Never true  Transportation Needs: No Transportation Needs   Lack of Transportation (Medical): No   Lack of Transportation (Non-Medical): No  Physical Activity: Inactive   Days of Exercise per Week: 0 days   Minutes of Exercise per Session: 0 min  Stress: No Stress Concern Present   Feeling of Stress : Not at all  Social Connections: Socially Integrated   Frequency of Communication with Friends and Family: More than three times a week   Frequency of Social Gatherings with Friends and Family: Once a week   Attends Religious Services: 1 to 4 times per year   Active Member of [employer] or Organizations: No   Attends Music therapist: 1 to 4 times per year   Marital Status: Married    Family History  Problem Relation Age of Onset   Asthma Father     Emphysema Father    Breast cancer Sister    Liver cancer Sister    Asthma Sister    Asthma Brother     Review of Systems  Constitutional:  Negative for chills and fever.  Respiratory:  Positive for shortness of breath (with exertion). Negative for cough (chronic - started over one year ago) and wheezing.   Cardiovascular:  Negative for chest pain, palpitations and leg swelling.  Musculoskeletal:  Positive for arthralgias and back pain.  Neurological:  Negative for light-headedness and headaches.  Psychiatric/Behavioral:  Positive for dysphoric mood and sleep disturbance. The patient is nervous/anxious.       Objective:   Vitals:   08/10/21 1426  BP: 140/68  Pulse: 70  Temp: 98.1 F (36.7 C)  SpO2: 97%   BP Readings from Last 3 Encounters:  08/10/21 140/68  04/28/21 (!) 159/58  02/22/21 (!) 150/60   Wt Readings from Last 3 Encounters:  08/10/21 259 lb 9.6 oz (117.8 kg)  04/28/21 252 lb 4.8 oz (114.4 kg)  02/22/21 252 lb 3.2 oz (114.4 kg)   Body mass index is 41.9 kg/m.   Physical Exam    Constitutional: Appears well-developed and well-nourished. No distress.  HENT:  Head: Normocephalic and atraumatic.  Neck: Neck supple. No tracheal deviation present. No thyromegaly present.  No cervical lymphadenopathy Cardiovascular: Normal rate, regular rhythm and normal heart sounds.  2/6 systolic murmur heard. No carotid bruit .  No edema Pulmonary/Chest: Effort normal and breath sounds normal. No respiratory distress. No has no wheezes. No rales.  Skin: Skin is warm and dry. Not diaphoretic.  Psychiatric: Normal mood and affect. Behavior is normal.      Assessment &  Plan:    See Problem List for Assessment and Plan of chronic medical problems.

## 2021-08-10 ENCOUNTER — Other Ambulatory Visit: Payer: Self-pay

## 2021-08-10 ENCOUNTER — Ambulatory Visit (INDEPENDENT_AMBULATORY_CARE_PROVIDER_SITE_OTHER): Payer: Medicare Other | Admitting: Internal Medicine

## 2021-08-10 VITALS — BP 140/68 | HR 70 | Temp 98.1°F | Ht 66.0 in | Wt 259.6 lb

## 2021-08-10 DIAGNOSIS — M170 Bilateral primary osteoarthritis of knee: Secondary | ICD-10-CM | POA: Diagnosis not present

## 2021-08-10 DIAGNOSIS — I2699 Other pulmonary embolism without acute cor pulmonale: Secondary | ICD-10-CM | POA: Diagnosis not present

## 2021-08-10 DIAGNOSIS — I10 Essential (primary) hypertension: Secondary | ICD-10-CM | POA: Diagnosis not present

## 2021-08-10 DIAGNOSIS — K219 Gastro-esophageal reflux disease without esophagitis: Secondary | ICD-10-CM | POA: Diagnosis not present

## 2021-08-10 DIAGNOSIS — G8929 Other chronic pain: Secondary | ICD-10-CM | POA: Diagnosis not present

## 2021-08-10 DIAGNOSIS — M545 Low back pain, unspecified: Secondary | ICD-10-CM

## 2021-08-10 DIAGNOSIS — F32A Depression, unspecified: Secondary | ICD-10-CM | POA: Diagnosis not present

## 2021-08-10 DIAGNOSIS — E782 Mixed hyperlipidemia: Secondary | ICD-10-CM

## 2021-08-10 DIAGNOSIS — D509 Iron deficiency anemia, unspecified: Secondary | ICD-10-CM

## 2021-08-10 DIAGNOSIS — G4700 Insomnia, unspecified: Secondary | ICD-10-CM | POA: Diagnosis not present

## 2021-08-10 DIAGNOSIS — R7303 Prediabetes: Secondary | ICD-10-CM

## 2021-08-10 DIAGNOSIS — F411 Generalized anxiety disorder: Secondary | ICD-10-CM

## 2021-08-10 LAB — HEMOGLOBIN A1C: Hgb A1c MFr Bld: 5.8 % (ref 4.6–6.5)

## 2021-08-10 LAB — COMPREHENSIVE METABOLIC PANEL
ALT: 13 U/L (ref 0–53)
AST: 14 U/L (ref 0–37)
Albumin: 4 g/dL (ref 3.5–5.2)
Alkaline Phosphatase: 94 U/L (ref 39–117)
BUN: 13 mg/dL (ref 6–23)
CO2: 29 mEq/L (ref 19–32)
Calcium: 8.8 mg/dL (ref 8.4–10.5)
Chloride: 100 mEq/L (ref 96–112)
Creatinine, Ser: 1.02 mg/dL (ref 0.40–1.50)
GFR: 71.8 mL/min (ref >60.00)
Glucose, Bld: 97 mg/dL (ref 70–99)
Potassium: 4.3 mEq/L (ref 3.5–5.1)
Sodium: 135 mEq/L (ref 135–145)
Total Bilirubin: 0.3 mg/dL (ref 0.2–1.2)
Total Protein: 7 g/dL (ref 6.0–8.3)

## 2021-08-10 LAB — CBC WITH DIFFERENTIAL/PLATELET
Basophils Absolute: 0.1 10*3/uL (ref 0.0–0.1)
Basophils Relative: 1.1 % (ref 0.0–3.0)
Eosinophils Absolute: 0.2 10*3/uL (ref 0.0–0.7)
Eosinophils Relative: 3.3 % (ref 0.0–5.0)
HCT: 31.6 % — ABNORMAL LOW (ref 39.0–52.0)
Hemoglobin: 10.5 g/dL — ABNORMAL LOW (ref 13.0–17.0)
Lymphocytes Relative: 20.1 % (ref 12.0–46.0)
Lymphs Abs: 1.2 10*3/uL (ref 0.7–4.0)
MCHC: 33.3 g/dL (ref 30.0–36.0)
MCV: 85.9 fl (ref 78.0–100.0)
Monocytes Absolute: 0.8 10*3/uL (ref 0.1–1.0)
Monocytes Relative: 12.3 % — ABNORMAL HIGH (ref 3.0–12.0)
Neutro Abs: 3.9 10*3/uL (ref 1.4–7.7)
Neutrophils Relative %: 63.2 % (ref 43.0–77.0)
Platelets: 246 10*3/uL (ref 150.0–400.0)
RBC: 3.68 Mil/uL — ABNORMAL LOW (ref 4.22–5.81)
RDW: 14.6 % (ref 11.5–15.5)
WBC: 6.2 10*3/uL (ref 4.0–10.5)

## 2021-08-10 LAB — LIPID PANEL
Cholesterol: 126 mg/dL (ref 0–200)
HDL: 37.7 mg/dL — ABNORMAL LOW (ref >39.00)
NonHDL: 88.61
Total CHOL/HDL Ratio: 3
Triglycerides: 214 mg/dL — ABNORMAL HIGH (ref 0.0–149.0)
VLDL: 42.8 mg/dL — ABNORMAL HIGH (ref 0.0–40.0)

## 2021-08-10 LAB — LDL CHOLESTEROL, DIRECT: Direct LDL: 62 mg/dL

## 2021-08-10 MED ORDER — TELMISARTAN-HCTZ 80-25 MG PO TABS: 1.0000 | ORAL_TABLET | Freq: Every day | ORAL | 1 refills | Status: DC

## 2021-08-10 MED ORDER — VENLAFAXINE HCL ER 150 MG PO CP24: 150.0000 mg | ORAL_CAPSULE | Freq: Every day | ORAL | 1 refills | Status: DC

## 2021-08-10 MED ORDER — TRAMADOL HCL 50 MG PO TABS: 100.0000 mg | ORAL_TABLET | Freq: Four times a day (QID) | ORAL | 0 refills | Status: DC | PRN

## 2021-08-10 MED ORDER — XARELTO 20 MG PO TABS: 20.0000 mg | ORAL_TABLET | Freq: Every day | ORAL | 1 refills | Status: DC

## 2021-08-10 MED ORDER — CLONAZEPAM 0.5 MG PO TABS: 0.5000 mg | ORAL_TABLET | Freq: Every day | ORAL | 0 refills | Status: DC

## 2021-08-10 NOTE — Assessment & Plan Note (Signed)
H/o PE x 2 On chronic a/c with xarelto Continue xarelto 20 mg daily

## 2021-08-10 NOTE — Assessment & Plan Note (Signed)
Chronic Not ideally controlled due to recent events Continue effexor but increase to 150 mg daily

## 2021-08-10 NOTE — Assessment & Plan Note (Signed)
Chronic Not controlled recently  Would like to try clonazepam - his wife takes it Start clonazepam 0.5 mg HS Stop xanax 1 mg HS Stop trazodone Increasing effexor to 150 mg daily which will likely help with depression and anxiety

## 2021-08-10 NOTE — Assessment & Plan Note (Signed)
Chronic  cbc 

## 2021-08-10 NOTE — Assessment & Plan Note (Signed)
Chronic Increased x several week -- with his brother not doing well and recently dying Continue venlafaxine but increase to 150 mg daily

## 2021-08-10 NOTE — Assessment & Plan Note (Signed)
Chronic Taking tramadol 100 mg q 8 hrs - very occasionally will take Q 6hrs if he has increased pain Will continue tramadol 100 mg Q 6 hrs prn - refill sent

## 2021-08-10 NOTE — Assessment & Plan Note (Signed)
Chronic Check a1c Low sugar / carb diet Stressed regular exercise  

## 2021-08-10 NOTE — Assessment & Plan Note (Signed)
Chronic Check lipid panel  Continue atorvastatin 80 mg daily, zetia 10 mg daily

## 2021-08-10 NOTE — Assessment & Plan Note (Signed)
Chronic GERD controlled Continue pantoprazole 40 mg daily 

## 2021-08-10 NOTE — Assessment & Plan Note (Signed)
Chronic BP controlled Continue micardis-hct 80-25 mg daily, amlodipine 5 mg daily cmp

## 2021-08-11 ENCOUNTER — Other Ambulatory Visit: Payer: Self-pay | Admitting: Internal Medicine

## 2021-08-11 DIAGNOSIS — I10 Essential (primary) hypertension: Secondary | ICD-10-CM

## 2021-08-15 ENCOUNTER — Other Ambulatory Visit: Payer: Self-pay | Admitting: Internal Medicine

## 2021-08-16 ENCOUNTER — Telehealth: Payer: Self-pay | Admitting: Internal Medicine

## 2021-08-16 MED ORDER — TELMISARTAN-HCTZ 80-25 MG PO TABS: 1.0000 | ORAL_TABLET | Freq: Every day | ORAL | 1 refills | Status: DC

## 2021-08-16 NOTE — Telephone Encounter (Signed)
Pt states telmisartan-hydrochlorothiazide (MICARDIS HCT) 80-25 MG tablet is out of stock at [facility]   Pt requesting rx sent to [id number] - [place], [place] - [place]

## 2021-08-24 ENCOUNTER — Other Ambulatory Visit: Payer: Self-pay | Admitting: Cardiology

## 2021-08-25 ENCOUNTER — Other Ambulatory Visit: Payer: Self-pay | Admitting: Internal Medicine

## 2021-08-25 DIAGNOSIS — I2699 Other pulmonary embolism without acute cor pulmonale: Secondary | ICD-10-CM

## 2021-08-25 DIAGNOSIS — D6859 Other primary thrombophilia: Secondary | ICD-10-CM

## 2021-09-07 ENCOUNTER — Other Ambulatory Visit: Payer: Self-pay | Admitting: Internal Medicine

## 2021-09-07 DIAGNOSIS — F411 Generalized anxiety disorder: Secondary | ICD-10-CM

## 2021-09-07 DIAGNOSIS — L600 Ingrowing nail: Secondary | ICD-10-CM | POA: Diagnosis not present

## 2021-09-07 DIAGNOSIS — B351 Tinea unguium: Secondary | ICD-10-CM | POA: Diagnosis not present

## 2021-09-07 DIAGNOSIS — I739 Peripheral vascular disease, unspecified: Secondary | ICD-10-CM | POA: Diagnosis not present

## 2021-09-07 DIAGNOSIS — F5104 Psychophysiologic insomnia: Secondary | ICD-10-CM

## 2021-09-07 DIAGNOSIS — M79674 Pain in right toe(s): Secondary | ICD-10-CM | POA: Diagnosis not present

## 2021-09-07 DIAGNOSIS — L603 Nail dystrophy: Secondary | ICD-10-CM | POA: Diagnosis not present

## 2021-09-07 DIAGNOSIS — M79675 Pain in left toe(s): Secondary | ICD-10-CM | POA: Diagnosis not present

## 2021-09-08 ENCOUNTER — Telehealth: Payer: Self-pay | Admitting: Internal Medicine

## 2021-09-10 ENCOUNTER — Other Ambulatory Visit: Payer: Self-pay | Admitting: Internal Medicine

## 2021-09-10 DIAGNOSIS — F411 Generalized anxiety disorder: Secondary | ICD-10-CM

## 2021-09-10 DIAGNOSIS — G4700 Insomnia, unspecified: Secondary | ICD-10-CM

## 2021-09-10 MED ORDER — CLONAZEPAM 0.5 MG PO TABS: 0.5000 mg | ORAL_TABLET | Freq: Every day | ORAL | 1 refills | Status: DC

## 2021-09-10 NOTE — Telephone Encounter (Signed)
Pt checking status of refill request ? ?Informed pt an appt is needed ? ?Pt states he was last seen by Dr. Quay Burow on 08-10-2021 due to provider not having availability ? ?Pts next appt is 02-09-2022  ?

## 2021-09-11 ENCOUNTER — Other Ambulatory Visit: Payer: Self-pay | Admitting: Internal Medicine

## 2021-09-11 DIAGNOSIS — E785 Hyperlipidemia, unspecified: Secondary | ICD-10-CM

## 2021-09-11 DIAGNOSIS — I2510 Atherosclerotic heart disease of native coronary artery without angina pectoris: Secondary | ICD-10-CM

## 2021-09-28 ENCOUNTER — Other Ambulatory Visit: Payer: Self-pay | Admitting: Internal Medicine

## 2021-09-28 ENCOUNTER — Telehealth: Payer: Self-pay | Admitting: Internal Medicine

## 2021-09-28 DIAGNOSIS — G4700 Insomnia, unspecified: Secondary | ICD-10-CM

## 2021-09-28 DIAGNOSIS — F32A Depression, unspecified: Secondary | ICD-10-CM

## 2021-09-28 DIAGNOSIS — F411 Generalized anxiety disorder: Secondary | ICD-10-CM

## 2021-09-28 MED ORDER — TRAZODONE HCL 50 MG PO TABS: 50.0000 mg | ORAL_TABLET | Freq: Every day | ORAL | 1 refills | Status: DC

## 2021-09-28 NOTE — Telephone Encounter (Signed)
Pt states he saw Dr. Quay Burow on 08-10-2021 ? ?The following medication changes was made at that visit  ? ?stop trazodone and alprazolam. Take ?the clonazepam at night. Increase venlafaxine to 150 mg daily ? ?Pt states he is only getting 2 hrs of sleep per night w/ the medication changes and inquiring if trazodone can be re-prescribe  ? ?[id number] [place], [place] - [address]? ? ?

## 2021-10-11 ENCOUNTER — Encounter: Payer: Self-pay | Admitting: Cardiology

## 2021-10-11 ENCOUNTER — Other Ambulatory Visit: Payer: Medicare Other

## 2021-10-14 ENCOUNTER — Telehealth: Payer: Self-pay | Admitting: Internal Medicine

## 2021-10-14 ENCOUNTER — Other Ambulatory Visit: Payer: Self-pay | Admitting: Internal Medicine

## 2021-10-14 DIAGNOSIS — M170 Bilateral primary osteoarthritis of knee: Secondary | ICD-10-CM

## 2021-10-14 MED ORDER — TRAMADOL HCL 50 MG PO TABS: 100.0000 mg | ORAL_TABLET | Freq: Four times a day (QID) | ORAL | 0 refills | Status: DC | PRN

## 2021-10-14 NOTE — Telephone Encounter (Signed)
Patient needs his tramadol refilled -  Dr. Quay Burow had sent in February for 360 - [facility] and then 120 last time to make up the 360.  Patient needs a rx sent in for 240 because he take 2 tablets by mouth every six hours. ?

## 2021-10-17 ENCOUNTER — Other Ambulatory Visit: Payer: Self-pay | Admitting: Cardiology

## 2021-10-19 ENCOUNTER — Other Ambulatory Visit: Payer: Self-pay | Admitting: Internal Medicine

## 2021-10-19 DIAGNOSIS — E785 Hyperlipidemia, unspecified: Secondary | ICD-10-CM

## 2021-10-19 DIAGNOSIS — I2510 Atherosclerotic heart disease of native coronary artery without angina pectoris: Secondary | ICD-10-CM

## 2021-10-26 ENCOUNTER — Other Ambulatory Visit: Payer: Self-pay

## 2021-10-26 DIAGNOSIS — D500 Iron deficiency anemia secondary to blood loss (chronic): Secondary | ICD-10-CM

## 2021-10-27 ENCOUNTER — Other Ambulatory Visit: Payer: Self-pay

## 2021-10-27 ENCOUNTER — Inpatient Hospital Stay: Payer: Medicare Other

## 2021-10-27 ENCOUNTER — Inpatient Hospital Stay: Payer: Medicare Other | Attending: Internal Medicine | Admitting: Internal Medicine

## 2021-10-27 VITALS — BP 180/66 | HR 78 | Temp 98.5°F | Resp 16 | Wt 248.4 lb

## 2021-10-27 DIAGNOSIS — D500 Iron deficiency anemia secondary to blood loss (chronic): Secondary | ICD-10-CM | POA: Diagnosis not present

## 2021-10-27 DIAGNOSIS — D509 Iron deficiency anemia, unspecified: Secondary | ICD-10-CM | POA: Diagnosis not present

## 2021-10-27 DIAGNOSIS — Z79899 Other long term (current) drug therapy: Secondary | ICD-10-CM | POA: Diagnosis not present

## 2021-10-27 LAB — CBC WITH DIFFERENTIAL (CANCER CENTER ONLY)
Abs Immature Granulocytes: 0.04 10*3/uL (ref 0.00–0.07)
Basophils Absolute: 0.1 10*3/uL (ref 0.0–0.1)
Basophils Relative: 1 %
Eosinophils Absolute: 0.2 10*3/uL (ref 0.0–0.5)
Eosinophils Relative: 3 %
HCT: 31.8 % — ABNORMAL LOW (ref 39.0–52.0)
Hemoglobin: 10.5 g/dL — ABNORMAL LOW (ref 13.0–17.0)
Immature Granulocytes: 1 %
Lymphocytes Relative: 15 %
Lymphs Abs: 1 10*3/uL (ref 0.7–4.0)
MCH: 28.9 pg (ref 26.0–34.0)
MCHC: 33 g/dL (ref 30.0–36.0)
MCV: 87.6 fL (ref 80.0–100.0)
Monocytes Absolute: 0.6 10*3/uL (ref 0.1–1.0)
Monocytes Relative: 9 %
Neutro Abs: 4.4 10*3/uL (ref 1.7–7.7)
Neutrophils Relative %: 71 %
Platelet Count: 264 10*3/uL (ref 150–400)
RBC: 3.63 MIL/uL — ABNORMAL LOW (ref 4.22–5.81)
RDW: 13.2 % (ref 11.5–15.5)
WBC Count: 6.3 10*3/uL (ref 4.0–10.5)
nRBC: 0 % (ref 0.0–0.2)

## 2021-10-27 LAB — IRON AND IRON BINDING CAPACITY (CC-WL,HP ONLY)
Iron: 224 ug/dL — ABNORMAL HIGH (ref 45–182)
Saturation Ratios: 68 % — ABNORMAL HIGH (ref 17.9–39.5)
TIBC: 331 ug/dL (ref 250–450)
UIBC: 107 ug/dL

## 2021-10-27 LAB — FERRITIN: Ferritin: 14 ng/mL — ABNORMAL LOW (ref 24–336)

## 2021-10-27 NOTE — Progress Notes (Signed)
?    [facility] ?[phone]   [phone] ? ?OFFICE PROGRESS NOTE ? ?[name], MD ?[place] [address] ? ?DIAGNOSIS: Microcytic anemia secondary to iron deficiency ? ?PRIOR THERAPY: None ? ?CURRENT THERAPY: Ferrous sulfate 325 mg p.o. daily. ? ?INTERVAL HISTORY: ?Aaron [name] 76 y.o. male returns to the clinic today for follow-up visit.  The patient is feeling fine today with no concerning complaints except for mild fatigue.  He denied having any chest pain, shortness of breath, cough or hemoptysis.  He has no nausea, vomiting, diarrhea or constipation.  He denied having any headache or visual changes.  He continues to tolerate the oral iron tablet fairly well.  The patient is here today for evaluation with repeat CBC, iron study and ferritin. ? ? ?MEDICAL HISTORY: ?Past Medical History:  ?Diagnosis Date  ? Anemia   ? "@ birth"  ? Anxiety   ? Blood transfusion   ? "@ birth"  ? Cancer [facility])   ? Head - skin cancer  ? Constipation due to pain medication   ? Coronary artery disease   ? DES LAD 2005  ? Depression   ? Full dentures   ? GERD (gastroesophageal reflux disease)   ? Headache(784.0)   ? 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 Heart murmur   ? Hyperlipidemia   ? Hypertension   ? Insomnia   ? Osteoarthritis   ? PONV (postoperative nausea and vomiting)   ? 2006 - knee surgery  ? Pre-diabetes   ? Psoriasis   ? PTSD (post-traumatic stress disorder)   ? Pulmonary embolism ([place]) ~ 03/2006  ? bilaterally  ? Seasonal allergies   ? Shortness of breath 06/08/11  ? "w/exertion; that's why I'm here"  ? Sleep apnea, obstructive   ? Umbilical hernia   ? ? ?ALLERGIES:  is allergic to wellbutrin [bupropion] and amoxicillin. ? ?MEDICATIONS:  ?Current Outpatient Medications  ?Medication Sig Dispense Refill  ? amLODipine (NORVASC) 5 MG tablet TAKE 1 TABLET BY MOUTH EVERYDAY AT BEDTIME 90 tablet 0  ? atorvastatin (LIPITOR) 80 MG tablet TAKE 1 TABLET BY MOUTH  DAILY 90 tablet 1  ? Carboxymethylcellulose Sodium 1 % GEL Place 1 application into both eyes at bedtime as needed (Dry eye).    ? clonazePAM (KLONOPIN) 0.5 MG tablet Take 1 tablet (0.5 mg total) by mouth at bedtime. 90 tablet 1  ? ezetimibe (ZETIA) 10 MG tablet Take 1 tablet (10 mg total) by mouth daily. 90 tablet 3  ? ferrous sulfate 325 (65 FE) MG EC tablet Take 1 tablet (325 mg total) by mouth daily with breakfast. Schedule an appointment for further refills, 1st attempt 60 tablet 0  ? melatonin 5 MG TABS Take 10 mg by mouth at bedtime.    ? pantoprazole (PROTONIX) 40 MG tablet TAKE 1 TABLET BY MOUTH EVERY DAY 90 tablet 1  ? polyethylene glycol (MIRALAX / GLYCOLAX) 17 g packet Take 17 g by mouth daily. With coffee    ? telmisartan-hydrochlorothiazide (MICARDIS HCT) 80-25 MG tablet Take 1 tablet by mouth daily. 90 tablet 1  ? traMADol (ULTRAM) 50 MG tablet Take 2 tablets (100 mg total) by mouth every 6 (six) hours as needed for moderate pain or severe pain. 240 tablet 0  ? traZODone (DESYREL) 50 MG tablet Take 1 tablet (50 mg total) by mouth at bedtime. 90 tablet 1  ? venlafaxine XR (EFFEXOR XR) 150 MG 24 hr  capsule Take 1 capsule (150 mg total) by mouth daily with breakfast. 90 capsule 1  ? XARELTO 20 MG TABS tablet Take 1 tablet (20 mg total) by mouth daily with supper. 90 tablet 1  ? ?No current facility-administered medications for this visit.  ? ? ?SURGICAL HISTORY:  ?Past Surgical History:  ?Procedure Laterality Date  ? CARDIAC CATHETERIZATION    ? carpel tunnel release Bilateral ~ 06/2005/~ 08/2005  ? right/left  ? COLONOSCOPY W/ BIOPSIES AND POLYPECTOMY    ? CORONARY ANGIOPLASTY    ? CORONARY STENT PLACEMENT  02/13/2004  ? 1  ? ENUCLEATION Right   ? age 41  ? EYE SURGERY    ? age 15; "ruptured  right pupil"  ? EYE SURGERY    ? age 39  ? EYE SURGERY  1957  ? placed artificial right eye  ? INGUINAL HERNIA REPAIR Right 1983  ? INGUINAL HERNIA REPAIR Left 1985  ? KNEE ARTHROSCOPY  ~ 06/2005  ? right  ? MULTIPLE  TOOTH EXTRACTIONS    ? spinal injections  2006-2007  ? "maybe 10 injections for herniated discs"  ? TONSILLECTOMY    ? "when I was real young"  ? TOTAL KNEE ARTHROPLASTY Left 10/09/2015  ? Procedure: TOTAL KNEE ARTHROPLASTY;  Surgeon: Dorna Leitz, MD;  Location: [facility];  Service: Orthopedics;  Laterality: Left;  ? TOTAL KNEE ARTHROPLASTY Right 04/04/2016  ? Procedure: TOTAL KNEE ARTHROPLASTY;  Surgeon: Dorna Leitz, MD;  Location: [facility];  Service: Orthopedics;  Laterality: Right;  ? ? ?REVIEW OF SYSTEMS:  A comprehensive review of systems was negative except for: Constitutional: positive for fatigue  ? ?PHYSICAL EXAMINATION: General appearance: alert, cooperative, fatigued, and no distress ?Head: Normocephalic, without obvious abnormality, atraumatic ?Neck: no adenopathy, no JVD, supple, symmetrical, trachea midline, and thyroid not enlarged, symmetric, no tenderness/mass/nodules ?Lymph nodes: Cervical, supraclavicular, and axillary nodes normal. ?Resp: clear to auscultation bilaterally ?Back: symmetric, no curvature. ROM normal. No CVA tenderness. ?Cardio: regular rate and rhythm, S1, S2 normal, no murmur, click, rub or gallop ?GI: soft, non-tender; bowel sounds normal; no masses,  no organomegaly ?Extremities: extremities normal, atraumatic, no cyanosis or edema ? ?ECOG PERFORMANCE STATUS: 1 - Symptomatic but completely ambulatory ? ?Blood pressure (!) 180/66, pulse 78, temperature 98.5 ?F (36.9 ?C), temperature source Tympanic, resp. rate 16, weight 248 lb 6.4 oz (112.7 kg), SpO2 99 %. ? ?LABORATORY DATA: ?Lab Results  ?Component Value Date  ? WBC 6.3 10/27/2021  ? HGB 10.5 (L) 10/27/2021  ? HCT 31.8 (L) 10/27/2021  ? MCV 87.6 10/27/2021  ? PLT 264 10/27/2021  ? ? ?  Chemistry   ?   ?Component Value Date/Time  ? NA 135 08/10/2021 1520  ? K 4.3 08/10/2021 1520  ? CL 100 08/10/2021 1520  ? CO2 29 08/10/2021 1520  ? BUN 13 08/10/2021 1520  ? CREATININE 1.02 08/10/2021 1520  ? CREATININE 1.24 06/15/2020 1306  ? CREATININE  1.16 02/10/2020 1532  ?    ?Component Value Date/Time  ? CALCIUM 8.8 08/10/2021 1520  ? ALKPHOS 94 08/10/2021 1520  ? AST 14 08/10/2021 1520  ? AST 17 06/15/2020 1306  ? ALT 13 08/10/2021 1520  ? ALT 12 06/15/2020 1306  ? BILITOT 0.3 08/10/2021 1520  ? BILITOT 0.3 12/17/2020 1017  ? BILITOT 0.4 06/15/2020 1306  ?  ? ? ? ?RADIOGRAPHIC STUDIES: ?No results found. ? ?ASSESSMENT AND PLAN: This is a very pleasant 76 years old white male with history of iron deficiency anemia secondary  to chronic gastrointestinal blood loss. ?The patient is currently on oral iron tablet with ferrous sulfate 325 mg p.o. daily and tolerating this treatment well. ?The patient continues to complain of fatigue worse with exertion. ?Repeat CBC today showed further decrease in his hemoglobin and hematocrit.  His hemoglobin is 10.5 and hematocrit 31.8%.  The patient has normal total white blood count as well as platelet count. ?I recommended for the patient to continue with the oral iron tablet  ?His iron studies today showed elevated serum iron and saturation.  Ferritin level is still pending.  I will hold on starting the iron infusion for now but would consider it if he has low ferritin level. ?I will see him back for follow-up visit in 6 months for evaluation and repeat CBC, iron study and ferritin. ?The patient was advised to call immediately if he has any other concerning symptoms in the interval. ?The patient voices understanding of current disease status and treatment options and is in agreement with the current care plan. ? ?All questions were answered. The patient knows to call the clinic with any problems, questions or concerns. We can certainly see the patient much sooner if necessary. ? ?The total time spent in the appointment was 20 minutes. ? ?Disclaimer: This note was dictated with voice recognition software. Similar sounding words can inadvertently be transcribed and may not be corrected upon review. ? ? ?  ?  ?

## 2021-10-28 ENCOUNTER — Telehealth: Payer: Self-pay | Admitting: Pharmacy Technician

## 2021-10-28 ENCOUNTER — Other Ambulatory Visit: Payer: Self-pay | Admitting: Internal Medicine

## 2021-10-28 ENCOUNTER — Other Ambulatory Visit: Payer: Self-pay | Admitting: Pharmacy Technician

## 2021-10-28 NOTE — Telephone Encounter (Signed)
Auth Submission: no auth needed ?Payer: [employer] MEDICARE ?Medication & CPT/J Code(s) submitted: Venofer (Iron Sucrose) J1756 ?Route of submission (phone, fax, portal): PORTAL ?Auth type: Buy/Bill ?Units/visits requested: 3 ?Reference number:  ?Approval from: 10/28/21 to 12/28/21  ?  ?

## 2021-10-29 ENCOUNTER — Ambulatory Visit (INDEPENDENT_AMBULATORY_CARE_PROVIDER_SITE_OTHER): Payer: Medicare Other

## 2021-10-29 DIAGNOSIS — Z0000 Encounter for general adult medical examination without abnormal findings: Secondary | ICD-10-CM | POA: Diagnosis not present

## 2021-10-29 NOTE — Progress Notes (Signed)
? ?Subjective:  ? Aaron [name] is a 76 y.o. male who presents for an Initial Medicare Annual Wellness Visit. ? ? ?I connected with [name] today by telephone and verified that I am speaking with the correct person using two identifiers. ?Location patient: home ?Location provider: work ?Persons participating in the virtual visit: patient, provider. ?  ?I discussed the limitations, risks, security and privacy concerns of performing an evaluation and management service by telephone and the availability of in person appointments. I also discussed with the patient that there may be a patient responsible charge related to this service. The patient expressed understanding and verbally consented to this telephonic visit.  ?  ?Interactive audio and video telecommunications were attempted between this provider and patient, however failed, due to patient having technical difficulties OR patient did not have access to video capability.  We continued and completed visit with audio only. ? ?  ?Review of Systems    ?Cardiac Risk Factors include: advanced age (>27mn, >>59women);male gender;hypertension ? ?   ?Objective:  ?  ?Today's Vitals  ? ?There is no height or weight on file to calculate BMI. ? ? ?  10/29/2021  ? 11:42 AM 12/23/2020  ?  3:17 PM 10/12/2020  ?  4:31 PM 06/15/2020  ?  1:39 PM 03/25/2016  ?  2:41 PM 09/25/2015  ?  2:54 PM 06/08/2011  ? 11:26 PM  ?Advanced Directives  ?Does Patient Have a Medical Advance Directive? No No No No No No Patient does not have advance directive;Patient would not like information  ?Would patient like information on creating a medical advance directive? No - Patient declined No - Patient declined No - Patient declined Yes (MAU/Ambulatory/Procedural Areas - Information given) Yes - Educational materials given No - patient declined information   ? ? ?Current Medications (verified) ?Outpatient Encounter Medications as of 10/29/2021  ?Medication Sig  ? amLODipine (NORVASC) 5 MG tablet TAKE 1  TABLET BY MOUTH EVERYDAY AT BEDTIME  ? atorvastatin (LIPITOR) 80 MG tablet TAKE 1 TABLET BY MOUTH DAILY  ? Carboxymethylcellulose Sodium 1 % GEL Place 1 application into both eyes at bedtime as needed (Dry eye).  ? clonazePAM (KLONOPIN) 0.5 MG tablet Take 1 tablet (0.5 mg total) by mouth at bedtime.  ? ezetimibe (ZETIA) 10 MG tablet Take 1 tablet (10 mg total) by mouth daily.  ? ferrous sulfate 325 (65 FE) MG EC tablet Take 1 tablet (325 mg total) by mouth daily with breakfast. Schedule an appointment for further refills, 1st attempt  ? melatonin 5 MG TABS Take 10 mg by mouth at bedtime.  ? pantoprazole (PROTONIX) 40 MG tablet TAKE 1 TABLET BY MOUTH EVERY DAY  ? polyethylene glycol (MIRALAX / GLYCOLAX) 17 g packet Take 17 g by mouth daily. With coffee  ? telmisartan-hydrochlorothiazide (MICARDIS HCT) 80-25 MG tablet Take 1 tablet by mouth daily.  ? traMADol (ULTRAM) 50 MG tablet Take 2 tablets (100 mg total) by mouth every 6 (six) hours as needed for moderate pain or severe pain.  ? traZODone (DESYREL) 50 MG tablet Take 1 tablet (50 mg total) by mouth at bedtime.  ? venlafaxine XR (EFFEXOR XR) 150 MG 24 hr capsule Take 1 capsule (150 mg total) by mouth daily with breakfast.  ? XARELTO 20 MG TABS tablet Take 1 tablet (20 mg total) by mouth daily with supper.  ? ?No facility-administered encounter medications on file as of 10/29/2021.  ? ? ?Allergies (verified) ?Wellbutrin [bupropion] and Amoxicillin  ? ?History: ?Past  Medical History:  ?Diagnosis Date  ? Anemia   ? "@ birth"  ? Anxiety   ? Blood transfusion   ? "@ birth"  ? Cancer [facility])   ? Head - skin cancer  ? Constipation due to pain medication   ? Coronary artery disease   ? DES LAD 2005  ? Depression   ? Full dentures   ? GERD (gastroesophageal reflux disease)   ? Headache(784.0)   ? 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 Heart murmur   ? Hyperlipidemia   ? Hypertension   ? Insomnia   ? Osteoarthritis   ? PONV (postoperative nausea and vomiting)   ?  2006 - knee surgery  ? Pre-diabetes   ? Psoriasis   ? PTSD (post-traumatic stress disorder)   ? Pulmonary embolism ([place]) ~ 03/2006  ? bilaterally  ? Seasonal allergies   ? Shortness of breath 06/08/11  ? "w/exertion; that's why I'm here"  ? Sleep apnea, obstructive   ? Umbilical hernia   ? ?Past Surgical History:  ?Procedure Laterality Date  ? CARDIAC CATHETERIZATION    ? carpel tunnel release Bilateral ~ 06/2005/~ 08/2005  ? right/left  ? COLONOSCOPY W/ BIOPSIES AND POLYPECTOMY    ? CORONARY ANGIOPLASTY    ? CORONARY STENT PLACEMENT  02/13/2004  ? 1  ? ENUCLEATION Right   ? age 56  ? EYE SURGERY    ? age 47; "ruptured  right pupil"  ? EYE SURGERY    ? age 59  ? EYE SURGERY  1957  ? placed artificial right eye  ? INGUINAL HERNIA REPAIR Right 1983  ? INGUINAL HERNIA REPAIR Left 1985  ? KNEE ARTHROSCOPY  ~ 06/2005  ? right  ? MULTIPLE TOOTH EXTRACTIONS    ? spinal injections  2006-2007  ? "maybe 10 injections for herniated discs"  ? TONSILLECTOMY    ? "when I was real young"  ? TOTAL KNEE ARTHROPLASTY Left 10/09/2015  ? Procedure: TOTAL KNEE ARTHROPLASTY;  Surgeon: Dorna Leitz, MD;  Location: [place];  Service: Orthopedics;  Laterality: Left;  ? TOTAL KNEE ARTHROPLASTY Right 04/04/2016  ? Procedure: TOTAL KNEE ARTHROPLASTY;  Surgeon: Dorna Leitz, MD;  Location: [place];  Service: Orthopedics;  Laterality: Right;  ? ?Family History  ?Problem Relation Age of Onset  ? Asthma Father   ? Emphysema Father   ? Breast cancer Sister   ? Liver cancer Sister   ? Asthma Sister   ? Asthma Brother   ? ?Social History  ? ?Socioeconomic History  ? Marital status: Married  ?  Spouse name: Not on file  ? Number of children: 3  ? Years of education: Not on file  ? Highest education level: Not on file  ?Occupational History  ? Occupation: building maintenance  ?  Comment: disabled  ?Tobacco Use  ? Smoking status: Former  ?  Packs/day: 1.50  ?  Years: 25.00  ?  Pack years: 37.50  ?  Types: Cigarettes  ?  Quit date: 07/10/1995  ?  Years since quitting:  26.3  ? Smokeless tobacco: Never  ?Vaping Use  ? Vaping Use: Never used  ?Substance and Sexual Activity  ? Alcohol use: No  ? Drug use: No  ? Sexual activity: Not on file  ?Other Topics Concern  ? Not on file  ?Social History Narrative  ? 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  ? Recent stress, currently undergoing financial difficulties.  ? ?Social  Determinants of Health  ? ?Financial Resource Strain: Low Risk   ? Difficulty of Paying Living Expenses: Not hard at all  ?Food Insecurity: No Food Insecurity  ? Worried About Charity fundraiser in the Last Year: Never true  ? Ran Out of Food in the Last Year: Never true  ?Transportation Needs: No Transportation Needs  ? Lack of Transportation (Medical): No  ? Lack of Transportation (Non-Medical): No  ?Physical Activity: Insufficiently Active  ? Days of Exercise per Week: 3 days  ? Minutes of Exercise per Session: 30 min  ?Stress: No Stress Concern Present  ? Feeling of Stress : Not at all  ?Social Connections: Socially Isolated  ? Frequency of Communication with Friends and Family: Three times a week  ? Frequency of Social Gatherings with Friends and Family: Three times a week  ? Attends Religious Services: Never  ? Active Member of Clubs or Organizations: No  ? Attends Archivist Meetings: Never  ? Marital Status: Widowed  ? ? ?Tobacco Counseling ?Counseling given: Not Answered ? ? ?Clinical Intake: ? ?Pre-visit preparation completed: Yes ? ?Pain : No/denies pain ? ?  ? ?Nutritional Risks: None ?Diabetes: No ? ?How often do you need to have someone help you when you read instructions, pamphlets, or other written materials from your doctor or pharmacy?: 1 - Never ?What is the last grade level you completed in school?: college ? ?Diabetic?no  ? ?Interpreter Needed?: No ? ?Information entered by :: [email or web address] ? ? ?Activities of Daily Living ? ?  10/29/2021  ? 11:43 AM  ?In your present state of health, do you have any difficulty  performing the following activities:  ?Hearing? 0  ?Vision? 0  ?Difficulty concentrating or making decisions? 0  ?Walking or climbing stairs? 0  ?Dressing or bathing? 0  ?Doing errands, shopping? 0  ?Preparing Fo

## 2021-10-29 NOTE — Patient Instructions (Signed)
Mr. Schnitker , ?Thank you for taking time to come for your Medicare Wellness Visit. I appreciate your ongoing commitment to your health goals. Please review the following plan we discussed and let me know if I can assist you in the future.  ? ?Screening recommendations/referrals: ?Colonoscopy: no longer required  ?Recommended yearly ophthalmology/optometry visit for glaucoma screening and checkup ?Recommended yearly dental visit for hygiene and checkup ? ?Vaccinations: ?Influenza vaccine: completed  ?Pneumococcal vaccine: completed  ?Tdap vaccine: 02/11/2015 ?Shingles vaccine: will consider  ? ?Advanced directives: yes ? ?Conditions/risks identified: none  ? ?Next appointment: none  ? ?Preventive Care 20 Years and Older, Male ?Preventive care refers to lifestyle choices and visits with your health care provider that can promote health and wellness. ?What does preventive care include? ?A yearly physical exam. This is also called an annual well check. ?Dental exams once or twice a year. ?Routine eye exams. Ask your health care provider how often you should have your eyes checked. ?Personal lifestyle choices, including: ?Daily care of your teeth and gums. ?Regular physical activity. ?Eating a healthy diet. ?Avoiding tobacco and drug use. ?Limiting alcohol use. ?Practicing safe sex. ?Taking low doses of aspirin every day. ?Taking vitamin and mineral supplements as recommended by your health care provider. ?What happens during an annual well check? ?The services and screenings done by your health care provider during your annual well check will depend on your age, overall health, lifestyle risk factors, and family history of disease. ?Counseling  ?Your health care provider may ask you questions about your: ?Alcohol use. ?Tobacco use. ?Drug use. ?Emotional well-being. ?Home and relationship well-being. ?Sexual activity. ?Eating habits. ?History of falls. ?Memory and ability to understand (cognition). ?Work and work  Statistician. ?Screening  ?You may have the following tests or measurements: ?Height, weight, and BMI. ?Blood pressure. ?Lipid and cholesterol levels. These may be checked every 5 years, or more frequently if you are over 41 years old. ?Skin check. ?Lung cancer screening. You may have this screening every year starting at age 68 if you have a 30-pack-year history of smoking and currently smoke or have quit within the past 15 years. ?Fecal occult blood test (FOBT) of the stool. You may have this test every year starting at age 65. ?Flexible sigmoidoscopy or colonoscopy. You may have a sigmoidoscopy every 5 years or a colonoscopy every 10 years starting at age 5. ?Prostate cancer screening. Recommendations will vary depending on your family history and other risks. ?Hepatitis C blood test. ?Hepatitis B blood test. ?Sexually transmitted disease (STD) testing. ?Diabetes screening. This is done by checking your blood sugar (glucose) after you have not eaten for a while (fasting). You may have this done every 1-3 years. ?Abdominal aortic aneurysm (AAA) screening. You may need this if you are a current or former smoker. ?Osteoporosis. You may be screened starting at age 42 if you are at high risk. ?Talk with your health care provider about your test results, treatment options, and if necessary, the need for more tests. ?Vaccines  ?Your health care provider may recommend certain vaccines, such as: ?Influenza vaccine. This is recommended every year. ?Tetanus, diphtheria, and acellular pertussis (Tdap, Td) vaccine. You may need a Td booster every 10 years. ?Zoster vaccine. You may need this after age 57. ?Pneumococcal 13-valent conjugate (PCV13) vaccine. One dose is recommended after age 98. ?Pneumococcal polysaccharide (PPSV23) vaccine. One dose is recommended after age 69. ?Talk to your health care provider about which screenings and vaccines you need and how often you  need them. ?This information is not intended to replace  advice given to you by your health care provider. Make sure you discuss any questions you have with your health care provider. ?Document Released: 07/17/2015 Document Revised: 03/09/2016 Document Reviewed: 04/21/2015 ?Elsevier Interactive Patient Education ? 2017 [place]. ? ?Fall Prevention in the Home ?Falls can cause injuries. They can happen to people of all ages. There are many things you can do to make your home safe and to help prevent falls. ?What can I do on the outside of my home? ?Regularly fix the edges of walkways and driveways and fix any cracks. ?Remove anything that might make you trip as you walk through a door, such as a raised step or threshold. ?Trim any bushes or trees on the path to your home. ?Use bright outdoor lighting. ?Clear any walking paths of anything that might make someone trip, such as rocks or tools. ?Regularly check to see if handrails are loose or broken. Make sure that both sides of any steps have handrails. ?Any raised decks and porches should have guardrails on the edges. ?Have any leaves, snow, or ice cleared regularly. ?Use sand or salt on walking paths during winter. ?Clean up any spills in your garage right away. This includes oil or grease spills. ?What can I do in the bathroom? ?Use night lights. ?Install grab bars by the toilet and in the tub and shower. Do not use towel bars as grab bars. ?Use non-skid mats or decals in the tub or shower. ?If you need to sit down in the shower, use a plastic, non-slip stool. ?Keep the floor dry. Clean up any water that spills on the floor as soon as it happens. ?Remove soap buildup in the tub or shower regularly. ?Attach bath mats securely with double-sided non-slip rug tape. ?Do not have throw rugs and other things on the floor that can make you trip. ?What can I do in the bedroom? ?Use night lights. ?Make sure that you have a light by your bed that is easy to reach. ?Do not use any sheets or blankets that are too big for your bed.  They should not hang down onto the floor. ?Have a firm chair that has side arms. You can use this for support while you get dressed. ?Do not have throw rugs and other things on the floor that can make you trip. ?What can I do in the kitchen? ?Clean up any spills right away. ?Avoid walking on wet floors. ?Keep items that you use a lot in easy-to-reach places. ?If you need to reach something above you, use a strong step stool that has a grab bar. ?Keep electrical cords out of the way. ?Do not use floor polish or wax that makes floors slippery. If you must use wax, use non-skid floor wax. ?Do not have throw rugs and other things on the floor that can make you trip. ?What can I do with my stairs? ?Do not leave any items on the stairs. ?Make sure that there are handrails on both sides of the stairs and use them. Fix handrails that are broken or loose. Make sure that handrails are as long as the stairways. ?Check any carpeting to make sure that it is firmly attached to the stairs. Fix any carpet that is loose or worn. ?Avoid having throw rugs at the top or bottom of the stairs. If you do have throw rugs, attach them to the floor with carpet tape. ?Make sure that you have a light switch  at the top of the stairs and the bottom of the stairs. If you do not have them, ask someone to add them for you. ?What else can I do to help prevent falls? ?Wear shoes that: ?Do not have high heels. ?Have rubber bottoms. ?Are comfortable and fit you well. ?Are closed at the toe. Do not wear sandals. ?If you use a stepladder: ?Make sure that it is fully opened. Do not climb a closed stepladder. ?Make sure that both sides of the stepladder are locked into place. ?Ask someone to hold it for you, if possible. ?Clearly mark and make sure that you can see: ?Any grab bars or handrails. ?First and last steps. ?Where the edge of each step is. ?Use tools that help you move around (mobility aids) if they are needed. These  include: ?Canes. ?Walkers. ?Scooters. ?Crutches. ?Turn on the lights when you go into a dark area. Replace any light bulbs as soon as they burn out. ?Set up your furniture so you have a clear path. Avoid moving your furniture around. ?I

## 2021-11-01 ENCOUNTER — Ambulatory Visit: Payer: Medicare Other | Admitting: Pulmonary Disease

## 2021-11-01 ENCOUNTER — Encounter: Payer: Self-pay | Admitting: Pulmonary Disease

## 2021-11-01 VITALS — BP 160/64 | HR 84 | Temp 98.5°F | Ht 66.0 in | Wt 248.8 lb

## 2021-11-01 DIAGNOSIS — G4733 Obstructive sleep apnea (adult) (pediatric): Secondary | ICD-10-CM | POA: Diagnosis not present

## 2021-11-01 DIAGNOSIS — R911 Solitary pulmonary nodule: Secondary | ICD-10-CM

## 2021-11-01 NOTE — Progress Notes (Signed)
? ? Pulmonary, Critical Care, and Sleep Medicine ? ?Chief Complaint  ?Patient presents with  ? Follow-up  ?  Patient is having shortness of breath with exertion and states that he is having fatigue. Supposed to start getting Iron infusions this week.  ? ? ?Constitutional:  ?BP (!) 160/64 (BP Location: Left Arm, Patient Position: Sitting, Cuff Size: Normal)   Pulse 84   Temp 98.5 ?F (36.9 ?C) (Oral)   Ht '5\' 6"'$  (1.676 m)   Wt 248 lb 12.8 oz (112.9 kg)   SpO2 98%   BMI 40.16 kg/m?  ? ?Past Medical History:  ?Anemia, Anxiety, CAD, Depression, GERD, Headaches, HLD, HTN, Osteoarthritis, Pre-DM, Psoriasis, PSTD, PE 2007 ? ?Past Surgical History:  ?He  has a past surgical history that includes spinal injections (2006-2007); carpel tunnel release (Bilateral, ~ 06/2005/~ 08/2005); Coronary stent placement (02/13/2004); Inguinal hernia repair (Right, 1983); Inguinal hernia repair (Left, 1985); Tonsillectomy; Eye surgery; Eye surgery; Enucleation (Right); Eye surgery [phone]); Cardiac catheterization; Knee arthroscopy (~ 06/2005); Coronary angioplasty; Total knee arthroplasty (Left, 10/09/2015); Colonoscopy w/ biopsies and polypectomy; Multiple tooth extractions; and Total knee arthroplasty (Right, 04/04/2016). ? ?Brief Summary:  ?Aaron [name] is a 76 y.o. male with obstructive sleep apnea and lung nodule. ?  ? ? ? ?Subjective:  ? ?He canceled CT chest.  It was on anniversary of his wife passing. ? ?He has occasional cough with glob of phlegm. ? ?Uses CPAP nightly.  Was getting acne around his nose.  Has been using face cleanser and this has helped. ? ?Physical Exam:  ? ?Appearance - well kempt  ? ?ENMT - no sinus tenderness, no oral exudate, no LAN, Mallampati 3 airway, no stridor ? ?Respiratory - equal breath sounds bilaterally, no wheezing or rales ? ?CV - s1s2 regular rate and rhythm, no murmurs ? ?Ext - no clubbing, no edema ? ?Skin - no rashes ? ?Psych - normal mood and affect ? ?  ?Pulmonary testing:  ? ? ?Chest  Imaging:  ?06/08/11 CT chest >> b/l PE, ATX, LUL 36m nodule ?12/11/11 CT chest >> LUL nodule resolved, but new b/l nodules up to 8 mm ?06/20/12 CT chest >> no change ?06/20/13 CT chest >> no change ?CT chest 10/26/20 >> atherosclerosis, scattered nodules up to 5 mm, scar Lt apex, calcified granulomas b/l, Lt hepatic cyst ? ?Sleep Tests:  ?PSG 09/22/13 >> AHI 5, RDI 61.4, SaO2 low 90%. ?Auto CPAP 09/29/21 to 10/28/21 >> used on 30 of 30 nights with average 8 hrs 7 min.  Average AHI 0.6 with median CPAP 9 and 95 th percentile CPAP 12 cm H2O ? ?Social History:  ?He  reports that he quit smoking about 26 years ago. His smoking use included cigarettes. He has a 37.50 pack-year smoking history. He has never used smokeless tobacco. He reports that he does not drink alcohol and does not use drugs. ? ?Family History:  ?His family history includes Asthma in his brother, father, and sister; Breast cancer in his sister; Emphysema in his father; Liver cancer in his sister. ?  ? ? ?Assessment/Plan:  ? ?Obstructive sleep apnea. ?- he is compliant with CPAP and reports benefit from therapy ?- he uses [employer] for his DME ?- continue auto CPAP 5 to 20 cm H2O ?- discussed options help with mask cleaning ? ?Lung nodule ?- will reschedule CT chest without contrast and call him with results ? ?Iron deficiency anemia from chronic GI losses. ?- he is followed by Dr. MCurt Bearswith hematology ? ?  Time Spent Involved in Patient Care on Day of Examination:  ?26 minutes ? ?Follow up:  ? ?Patient Instructions  ?Will get your CT chest reschedule and call with results ? ?Remember to clean your CPAP mask on daily basis ? ?Follow up in 1 year ? ?Medication List:  ? ?Allergies as of 11/01/2021   ? ?   Reactions  ? Wellbutrin [bupropion] Other (See Comments)  ? Hallucinations, sweating  ? Amoxicillin Palpitations  ? ?  ? ?  ?Medication List  ?  ? ?  ? Accurate as of Nov 01, 2021  3:27 PM. If you have any questions, ask your nurse or doctor.  ?  ?  ? ?   ? ?amLODipine 5 MG tablet ?Commonly known as: NORVASC ?TAKE 1 TABLET BY MOUTH EVERYDAY AT BEDTIME ?  ?atorvastatin 80 MG tablet ?Commonly known as: LIPITOR ?TAKE 1 TABLET BY MOUTH DAILY ?  ?Carboxymethylcellulose Sodium 1 % Gel ?Place 1 application into both eyes at bedtime as needed (Dry eye). ?  ?clonazePAM 0.5 MG tablet ?Commonly known as: KLONOPIN ?Take 1 tablet (0.5 mg total) by mouth at bedtime. ?  ?ezetimibe 10 MG tablet ?Commonly known as: ZETIA ?Take 1 tablet (10 mg total) by mouth daily. ?  ?ferrous sulfate 325 (65 FE) MG EC tablet ?Take 1 tablet (325 mg total) by mouth daily with breakfast. Schedule an appointment for further refills, 1st attempt ?  ?melatonin 5 MG Tabs ?Take 10 mg by mouth at bedtime. ?  ?pantoprazole 40 MG tablet ?Commonly known as: PROTONIX ?TAKE 1 TABLET BY MOUTH EVERY DAY ?  ?polyethylene glycol 17 g packet ?Commonly known as: MIRALAX / GLYCOLAX ?Take 17 g by mouth daily. With coffee ?  ?telmisartan-hydrochlorothiazide 80-25 MG tablet ?Commonly known as: MICARDIS HCT ?Take 1 tablet by mouth daily. ?  ?traMADol 50 MG tablet ?Commonly known as: ULTRAM ?Take 2 tablets (100 mg total) by mouth every 6 (six) hours as needed for moderate pain or severe pain. ?  ?traZODone 50 MG tablet ?Commonly known as: DESYREL ?Take 1 tablet (50 mg total) by mouth at bedtime. ?  ?venlafaxine XR 150 MG 24 hr capsule ?Commonly known as: Effexor XR ?Take 1 capsule (150 mg total) by mouth daily with breakfast. ?  ?Xarelto 20 MG Tabs tablet ?Generic drug: rivaroxaban ?Take 1 tablet (20 mg total) by mouth daily with supper. ?  ? ?  ? ? ?Signature:  ?[name], MD ?[place] ?Pager - [phone] ?11/01/2021, 3:27 PM ?  ? ? ? ? ? ? ? ? ?

## 2021-11-01 NOTE — Patient Instructions (Signed)
Will get your CT chest reschedule and call with results ? ?Remember to clean your CPAP mask on daily basis ? ?Follow up in 1 year ?

## 2021-11-04 ENCOUNTER — Ambulatory Visit (INDEPENDENT_AMBULATORY_CARE_PROVIDER_SITE_OTHER): Payer: Medicare Other

## 2021-11-04 VITALS — BP 144/72 | HR 67 | Temp 97.9°F | Resp 16 | Ht 66.0 in | Wt 249.2 lb

## 2021-11-04 DIAGNOSIS — D509 Iron deficiency anemia, unspecified: Secondary | ICD-10-CM

## 2021-11-04 MED ORDER — ALBUTEROL SULFATE HFA 108 (90 BASE) MCG/ACT IN AERS: 2.0000 | INHALATION_SPRAY | Freq: Once | RESPIRATORY_TRACT | Status: DC | PRN

## 2021-11-04 MED ORDER — SODIUM CHLORIDE 0.9 % IV SOLN: Freq: Once | INTRAVENOUS | Status: DC | PRN

## 2021-11-04 MED ORDER — FAMOTIDINE IN NACL 20-0.9 MG/50ML-% IV SOLN: 20.0000 mg | Freq: Once | INTRAVENOUS | Status: DC | PRN

## 2021-11-04 MED ORDER — METHYLPREDNISOLONE SODIUM SUCC 125 MG IJ SOLR: 125.0000 mg | Freq: Once | INTRAMUSCULAR | Status: DC | PRN

## 2021-11-04 MED ORDER — DIPHENHYDRAMINE HCL 50 MG/ML IJ SOLN: 50.0000 mg | Freq: Once | INTRAMUSCULAR | Status: DC | PRN

## 2021-11-04 MED ORDER — SODIUM CHLORIDE 0.9 % IV SOLN
300.0000 mg | INTRAVENOUS | Status: DC
  Administered 2021-11-04: 300 mg via INTRAVENOUS
  Filled 2021-11-04: qty 15

## 2021-11-04 MED ORDER — EPINEPHRINE 0.3 MG/0.3ML IJ SOAJ: 0.3000 mg | Freq: Once | INTRAMUSCULAR | Status: DC | PRN

## 2021-11-04 NOTE — Progress Notes (Signed)
Diagnosis: Iron Deficiency Anemia ? ?Provider:  Marshell Garfinkel, MD ? ?Procedure: Infusion ? ?IV Type: Peripheral, IV Location: L Antecubital ? ?Venofer (Iron Sucrose), Dose: 300 mg ? ?Infusion Start Time: 1310 ? ?Infusion Stop Time: 2683 ? ?Post Infusion IV Care: Observation period completed ? ?Discharge: Condition: Good, Destination: Home . AVS provided to patient.  ? ?Performed by:  Paul Dykes, RN  ?  ?

## 2021-11-07 ENCOUNTER — Other Ambulatory Visit: Payer: Self-pay | Admitting: Internal Medicine

## 2021-11-07 DIAGNOSIS — I10 Essential (primary) hypertension: Secondary | ICD-10-CM

## 2021-11-08 ENCOUNTER — Other Ambulatory Visit: Payer: Self-pay | Admitting: Cardiology

## 2021-11-10 ENCOUNTER — Ambulatory Visit (INDEPENDENT_AMBULATORY_CARE_PROVIDER_SITE_OTHER)
Admission: RE | Admit: 2021-11-10 | Discharge: 2021-11-10 | Disposition: A | Discharge status: Home / Self Care | Payer: Medicare Other | Source: Ambulatory Visit | Attending: Pulmonary Disease | Admitting: Pulmonary Disease

## 2021-11-10 DIAGNOSIS — R918 Other nonspecific abnormal finding of lung field: Secondary | ICD-10-CM | POA: Diagnosis not present

## 2021-11-10 DIAGNOSIS — R911 Solitary pulmonary nodule: Secondary | ICD-10-CM

## 2021-11-11 ENCOUNTER — Ambulatory Visit (INDEPENDENT_AMBULATORY_CARE_PROVIDER_SITE_OTHER): Payer: Medicare Other

## 2021-11-11 VITALS — BP 130/73 | HR 68 | Temp 98.3°F | Resp 18 | Ht 66.0 in | Wt 247.0 lb

## 2021-11-11 DIAGNOSIS — D509 Iron deficiency anemia, unspecified: Secondary | ICD-10-CM | POA: Diagnosis not present

## 2021-11-11 MED ORDER — SODIUM CHLORIDE 0.9 % IV SOLN
300.0000 mg | INTRAVENOUS | Status: DC
  Administered 2021-11-11: 300 mg via INTRAVENOUS
  Filled 2021-11-11: qty 15

## 2021-11-11 NOTE — Progress Notes (Signed)
Diagnosis: Iron Deficiency Anemia ? ?Provider:  Marshell Garfinkel, MD ? ?Procedure: Infusion ? ?IV Type: Peripheral, IV Location: L Antecubital ? ?Venofer (Iron Sucrose), Dose: 300 mg ? ?Infusion Start Time: 1779 ? ?Infusion Stop Time: 1503 ? ?Post Infusion IV Care: Peripheral IV Discontinued ? ?Discharge: Condition: Good, Destination: Home . AVS provided to patient.  ? ?Performed by:  Adelina Mings, LPN  ?  ?

## 2021-11-14 ENCOUNTER — Other Ambulatory Visit: Payer: Self-pay | Admitting: Internal Medicine

## 2021-11-14 DIAGNOSIS — M170 Bilateral primary osteoarthritis of knee: Secondary | ICD-10-CM

## 2021-11-18 ENCOUNTER — Ambulatory Visit (INDEPENDENT_AMBULATORY_CARE_PROVIDER_SITE_OTHER): Payer: Medicare Other

## 2021-11-18 VITALS — BP 144/83 | HR 62 | Temp 98.0°F | Resp 18 | Ht 66.0 in | Wt 246.6 lb

## 2021-11-18 DIAGNOSIS — D509 Iron deficiency anemia, unspecified: Secondary | ICD-10-CM | POA: Diagnosis not present

## 2021-11-18 MED ORDER — SODIUM CHLORIDE 0.9 % IV SOLN
300.0000 mg | INTRAVENOUS | Status: DC
  Administered 2021-11-18: 300 mg via INTRAVENOUS
  Filled 2021-11-18: qty 15

## 2021-11-18 NOTE — Progress Notes (Signed)
Diagnosis: Iron Deficiency Anemia  Provider:  Marshell Garfinkel, MD  Procedure: Infusion  IV Type: Peripheral, IV Location: L Antecubital  Venofer (Iron Sucrose), Dose: 300 mg  Infusion Start Time: 4076  Infusion Stop Time: 8088  Post Infusion IV Care: Peripheral IV Discontinued  Discharge: Condition: Good, Destination: Home . AVS provided to patient.   Performed by:  Koren Shiver, RN

## 2021-11-23 ENCOUNTER — Encounter: Payer: Self-pay | Admitting: Pulmonary Disease

## 2021-12-07 DIAGNOSIS — L603 Nail dystrophy: Secondary | ICD-10-CM | POA: Diagnosis not present

## 2021-12-07 DIAGNOSIS — I739 Peripheral vascular disease, unspecified: Secondary | ICD-10-CM | POA: Diagnosis not present

## 2021-12-18 ENCOUNTER — Other Ambulatory Visit: Payer: Self-pay | Admitting: Cardiology

## 2021-12-20 ENCOUNTER — Other Ambulatory Visit: Payer: Self-pay | Admitting: Internal Medicine

## 2021-12-24 ENCOUNTER — Other Ambulatory Visit: Payer: Self-pay | Admitting: Internal Medicine

## 2021-12-24 DIAGNOSIS — M170 Bilateral primary osteoarthritis of knee: Secondary | ICD-10-CM

## 2021-12-30 ENCOUNTER — Other Ambulatory Visit: Payer: Self-pay | Admitting: Internal Medicine

## 2021-12-30 DIAGNOSIS — I2699 Other pulmonary embolism without acute cor pulmonale: Secondary | ICD-10-CM

## 2022-01-01 ENCOUNTER — Other Ambulatory Visit: Payer: Self-pay | Admitting: Cardiology

## 2022-01-13 DIAGNOSIS — G4733 Obstructive sleep apnea (adult) (pediatric): Secondary | ICD-10-CM | POA: Diagnosis not present

## 2022-01-25 DIAGNOSIS — M5451 Vertebrogenic low back pain: Secondary | ICD-10-CM | POA: Diagnosis not present

## 2022-02-02 ENCOUNTER — Other Ambulatory Visit: Payer: Self-pay | Admitting: Internal Medicine

## 2022-02-02 DIAGNOSIS — M170 Bilateral primary osteoarthritis of knee: Secondary | ICD-10-CM

## 2022-02-02 DIAGNOSIS — I10 Essential (primary) hypertension: Secondary | ICD-10-CM

## 2022-02-09 ENCOUNTER — Ambulatory Visit (INDEPENDENT_AMBULATORY_CARE_PROVIDER_SITE_OTHER): Payer: Medicare Other | Admitting: Internal Medicine

## 2022-02-09 ENCOUNTER — Telehealth: Payer: Self-pay | Admitting: *Deleted

## 2022-02-09 ENCOUNTER — Encounter: Payer: Self-pay | Admitting: Internal Medicine

## 2022-02-09 VITALS — BP 172/56 | HR 95 | Temp 98.3°F | Resp 20 | Ht 66.0 in | Wt 247.1 lb

## 2022-02-09 DIAGNOSIS — Z23 Encounter for immunization: Secondary | ICD-10-CM

## 2022-02-09 DIAGNOSIS — D509 Iron deficiency anemia, unspecified: Secondary | ICD-10-CM

## 2022-02-09 DIAGNOSIS — K219 Gastro-esophageal reflux disease without esophagitis: Secondary | ICD-10-CM | POA: Diagnosis not present

## 2022-02-09 DIAGNOSIS — Z0001 Encounter for general adult medical examination with abnormal findings: Secondary | ICD-10-CM | POA: Diagnosis not present

## 2022-02-09 DIAGNOSIS — I10 Essential (primary) hypertension: Secondary | ICD-10-CM

## 2022-02-09 DIAGNOSIS — R195 Other fecal abnormalities: Secondary | ICD-10-CM | POA: Diagnosis not present

## 2022-02-09 DIAGNOSIS — F5104 Psychophysiologic insomnia: Secondary | ICD-10-CM

## 2022-02-09 LAB — CBC WITH DIFFERENTIAL/PLATELET
Basophils Absolute: 0.1 10*3/uL (ref 0.0–0.1)
Basophils Relative: 1.1 % (ref 0.0–3.0)
Eosinophils Absolute: 0.2 10*3/uL (ref 0.0–0.7)
Eosinophils Relative: 2.2 % (ref 0.0–5.0)
HCT: 23.5 % — CL (ref 39.0–52.0)
Hemoglobin: 7.6 g/dL — CL (ref 13.0–17.0)
Lymphocytes Relative: 14.8 % (ref 12.0–46.0)
Lymphs Abs: 1.2 10*3/uL (ref 0.7–4.0)
MCHC: 32.2 g/dL (ref 30.0–36.0)
MCV: 87.6 fl (ref 78.0–100.0)
Monocytes Absolute: 0.7 10*3/uL (ref 0.1–1.0)
Monocytes Relative: 8.8 % (ref 3.0–12.0)
Neutro Abs: 5.7 10*3/uL (ref 1.4–7.7)
Neutrophils Relative %: 73.1 % (ref 43.0–77.0)
Platelets: 339 10*3/uL (ref 150.0–400.0)
RBC: 2.69 Mil/uL — ABNORMAL LOW (ref 4.22–5.81)
RDW: 16.3 % — ABNORMAL HIGH (ref 11.5–15.5)
WBC: 7.9 10*3/uL (ref 4.0–10.5)

## 2022-02-09 LAB — IBC + FERRITIN
Ferritin: 12.9 ng/mL — ABNORMAL LOW (ref 22.0–322.0)
Iron: 24 ug/dL — ABNORMAL LOW (ref 42–165)
Saturation Ratios: 5.8 % — ABNORMAL LOW (ref 20.0–50.0)
TIBC: 411.6 ug/dL (ref 250.0–450.0)
Transferrin: 294 mg/dL (ref 212.0–360.0)

## 2022-02-09 LAB — TSH: TSH: 2.44 u[IU]/mL (ref 0.35–5.50)

## 2022-02-09 NOTE — Telephone Encounter (Signed)
CRITICAL VALUE STICKER  CRITICAL VALUE: Hemoglobin 7.6 Hemoccrit- 23.5  RECEIVER (on-site recipient of call): Mylinda Latina  DATE & TIME NOTIFIED: 02/09/2022 @ 5:00pm  MESSENGER (representative from lab): Santiago Glad  MD NOTIFIED: yes  TIME OF NOTIFICATION: 5:00pm  RESPONSE:  awaiting response

## 2022-02-09 NOTE — Progress Notes (Signed)
Subjective:  Patient ID: Aaron [name], male    DOB: Sep 17, 1945  Age: 76 y.o. MRN: [id number]  CC: Annual Exam, Hypertension, and Anemia   HPI Aaron [name] presents for a CPX and f/up -   He complains of shortness of breath.  He denies chest pain, diaphoresis, edema, or dizziness.  He describes rare hemorrhoidal bleeding.  He denies abdominal pain, nausea or vomiting.  He is taking midodrine 10 mg of melatonin and Benadryl for insomnia.  Outpatient Medications Prior to Visit  Medication Sig Dispense Refill   amLODipine (NORVASC) 5 MG tablet TAKE 1 TABLET BY MOUTH EVERYDAY AT BEDTIME 90 tablet 0   Carboxymethylcellulose Sodium 1 % GEL Place 1 application into both eyes at bedtime as needed (Dry eye).     clonazePAM (KLONOPIN) 0.5 MG tablet Take 1 tablet (0.5 mg total) by mouth at bedtime. 90 tablet 1   ezetimibe (ZETIA) 10 MG tablet TAKE 1 TABLET BY MOUTH  DAILY 90 tablet 3   ferrous sulfate 325 (65 FE) MG EC tablet TAKE 1 TABLET BY MOUTH DAILY WITH BREAKFAST. SCHEDULE AN APPOINTMENT FOR FURTHER REFILLS 60 tablet 3   polyethylene glycol (MIRALAX / GLYCOLAX) 17 g packet Take 17 g by mouth daily. With coffee     telmisartan-hydrochlorothiazide (MICARDIS HCT) 80-25 MG tablet Take 1 tablet by mouth daily. 90 tablet 1   tiZANidine (ZANAFLEX) 4 MG tablet Take 4 mg by mouth every 8 (eight) hours as needed.     traMADol (ULTRAM) 50 MG tablet TAKE 2 TABLETS (100 MG TOTAL) BY MOUTH EVERY 6 (SIX) HOURS AS NEEDED FOR MODERATE PAIN - SEVERE PAIN 240 tablet 0   traZODone (DESYREL) 50 MG tablet Take 1 tablet (50 mg total) by mouth at bedtime. 90 tablet 1   venlafaxine XR (EFFEXOR-XR) 150 MG 24 hr capsule TAKE 1 CAPSULE BY MOUTH DAILY  WITH BREAKFAST 90 capsule 0   XARELTO 20 MG TABS tablet TAKE 1 TABLET BY MOUTH DAILY  WITH SUPPER 90 tablet 0   atorvastatin (LIPITOR) 80 MG tablet TAKE 1 TABLET BY MOUTH DAILY 90 tablet 1   melatonin 5 MG TABS Take 10 mg by mouth at bedtime.     pantoprazole (PROTONIX)  40 MG tablet TAKE 1 TABLET BY MOUTH EVERY DAY 90 tablet 1   No facility-administered medications prior to visit.    ROS Review of Systems  Constitutional:  Negative for chills, diaphoresis, fatigue and fever.  HENT: Negative.  Negative for trouble swallowing.   Eyes: Negative.   Respiratory:  Positive for shortness of breath. Negative for chest tightness and wheezing.   Cardiovascular:  Negative for chest pain, palpitations and leg swelling.  Gastrointestinal:  Negative for abdominal pain, constipation, diarrhea, nausea and vomiting.  Endocrine: Negative.   Genitourinary: Negative.  Negative for difficulty urinating.  Musculoskeletal:  Positive for back pain. Negative for arthralgias and myalgias.  Skin:  Positive for pallor.  Neurological:  Negative for dizziness and weakness.  Hematological:  Negative for adenopathy. Does not bruise/bleed easily.  Psychiatric/Behavioral:  Positive for sleep disturbance. Negative for confusion, decreased concentration, dysphoric mood and suicidal ideas. The patient is nervous/anxious.     Objective:  BP (!) 172/56 (BP Location: Left Arm, Patient Position: Sitting, Cuff Size: Large) Comment: BP (R) 166/52 (L) 172/56  Pulse 95   Temp 98.3 F (36.8 C) (Oral)   Resp 20   Ht '5\' 6"'$  (1.676 m)   Wt 247 lb 2 oz (112.1 kg)   SpO2 99%  BMI 39.89 kg/m   BP Readings from Last 3 Encounters:  02/09/22 (!) 172/56  11/18/21 (!) 144/83  11/11/21 130/73    Wt Readings from Last 3 Encounters:  02/09/22 247 lb 2 oz (112.1 kg)  11/18/21 246 lb 9.6 oz (111.9 kg)  11/11/21 247 lb (112 kg)    Physical Exam Vitals reviewed. Exam conducted with a chaperone present [facility] Nurse).  Constitutional:      General: He is not in acute distress.    Appearance: He is obese. He is not toxic-appearing or diaphoretic.  HENT:     Nose: Nose normal.     Mouth/Throat:     Mouth: Mucous membranes are moist.  Eyes:     General: No scleral icterus.     Conjunctiva/sclera: Conjunctivae normal.  Cardiovascular:     Rate and Rhythm: Normal rate and regular rhythm.     Heart sounds: Normal heart sounds, S1 normal and S2 normal. No murmur heard.    Comments: EKG- NSR, 78 bpm Normal EKG Pulmonary:     Effort: Pulmonary effort is normal.     Breath sounds: No stridor. No wheezing, rhonchi or rales.  Abdominal:     General: Abdomen is protuberant.     Palpations: There is no mass.     Tenderness: There is no abdominal tenderness. There is no guarding.     Hernia: A hernia is present. Hernia is present in the umbilical area.  Genitourinary:    Prostate: Normal. Not enlarged, not tender and no nodules present.     Rectum: Guaiac result positive. Internal hemorrhoid present. No mass, tenderness, anal fissure or external hemorrhoid. Normal anal tone.  Musculoskeletal:        General: Normal range of motion.     Cervical back: Neck supple.     Right lower leg: No edema.     Left lower leg: No edema.  Lymphadenopathy:     Cervical: No cervical adenopathy.  Skin:    General: Skin is warm and dry.  Neurological:     General: No focal deficit present.     Mental Status: He is alert and oriented to person, place, and time.  Psychiatric:        Mood and Affect: Mood normal.        Behavior: Behavior normal.     Lab Results  Component Value Date   WBC 7.9 02/09/2022   HGB 7.6 Repeated and verified X2. (LL) 02/09/2022   HCT 23.5 Repeated and verified X2. (LL) 02/09/2022   PLT 339.0 02/09/2022   GLUCOSE 97 08/10/2021   CHOL 126 08/10/2021   TRIG 214.0 (H) 08/10/2021   HDL 37.70 (L) 08/10/2021   LDLDIRECT 62.0 08/10/2021   LDLCALC 89 12/17/2020   ALT 13 08/10/2021   AST 14 08/10/2021   NA 135 08/10/2021   K 4.3 08/10/2021   CL 100 08/10/2021   CREATININE 1.02 08/10/2021   BUN 13 08/10/2021   CO2 29 08/10/2021   TSH 2.44 02/09/2022   PSA 0.59 02/14/2018   INR 1.11 03/25/2016   HGBA1C 5.8 08/10/2021    CT Chest Wo  Contrast  Result Date: 11/11/2021 CLINICAL DATA:  Pulmonary nodules. EXAM: CT CHEST WITHOUT CONTRAST TECHNIQUE: Multidetector CT imaging of the chest was performed following the standard protocol without IV contrast. RADIATION DOSE REDUCTION: This exam was performed according to the departmental dose-optimization program which includes automated exposure control, adjustment of the mA and/or kV according to patient size and/or use of iterative reconstruction  technique. COMPARISON:  10/24/2020 FINDINGS: Cardiovascular: The heart size is normal. No substantial pericardial effusion. Coronary artery calcification is evident. Mild atherosclerotic calcification is noted in the wall of the thoracic aorta. Mediastinum/Nodes: No mediastinal lymphadenopathy. No evidence for gross hilar lymphadenopathy although assessment is limited by the lack of intravenous contrast on the current study. The esophagus has normal imaging features. There is no axillary lymphadenopathy. Lungs/Pleura: Scattered tiny calcified and noncalcified pulmonary nodules identified previously in both lungs are stable in the interval. There is no new suspicious pulmonary nodule or mass. No focal airspace consolidation. No pleural effusion. Upper Abdomen: Small hypoattenuating lesions in the liver are stable, consistent with benign etiology such as cyst. Some of these lesions are too small to characterize but are most likely benign. Musculoskeletal: No worrisome lytic or sclerotic osseous abnormality. IMPRESSION: 1. Stable exam. Tiny calcified and noncalcified pulmonary nodules identified previously and measuring 5 mm or less in size are stable in the interval. 12 months of imaging stability for nodules of this size in a low risk patient can be considered benign. 2. Aortic Atherosclerosis (ICD10-I70.0). Electronically Signed   By: Misty Stanley M.D.   On: 11/11/2021 16:46    Assessment & Plan:   Mosiah was seen today for annual exam, hypertension and  anemia.  Diagnoses and all orders for this visit:  Primary hypertension- His blood pressure is not adequately well controlled.  I have asked him to discontinue the melatonin.  Will check labs to screen for secondary causes.  His EKG is negative for LVH. -     TSH; Future -     Aldosterone + renin activity w/ ratio; Future -     Aldosterone + renin activity w/ ratio -     TSH -     EKG 12-Lead  Iron deficiency anemia, unspecified iron deficiency anemia type- His H&H have worsened and his iron level is low.  I spoke to his hematologist about getting him in for a stat iron infusion. -     IBC + Ferritin; Future -     CBC with Differential/Platelet; Future -     CBC with Differential/Platelet -     IBC + Ferritin -     Ambulatory referral to Gastroenterology  Encounter for general adult medical examination with abnormal findings- Exam completed, labs reviewed, vaccines reviewed and updated, cancer screenings addressed, patient education was given.  Occult blood in stools -     Cancel: Ambulatory referral to Gastroenterology -     Ambulatory referral to Gastroenterology  Gastroesophageal reflux disease, unspecified whether esophagitis present -     pantoprazole (PROTONIX) 40 MG tablet; Take 1 tablet (40 mg total) by mouth daily.  Psychophysiological insomnia -     Daridorexant HCl (QUVIVIQ) 50 MG TABS; Take 1 tablet by mouth at bedtime.   I have discontinued Lary L. Garringer's melatonin. I have also changed his pantoprazole. Additionally, I am having him start on Quviviq and Shingrix. Lastly, I am having him maintain his polyethylene glycol, Carboxymethylcellulose Sodium, telmisartan-hydrochlorothiazide, clonazePAM, traZODone, ezetimibe, venlafaxine XR, Xarelto, ferrous sulfate, amLODipine, traMADol, and tiZANidine.  Meds ordered this encounter  Medications   pantoprazole (PROTONIX) 40 MG tablet    Sig: Take 1 tablet (40 mg total) by mouth daily.    Dispense:  90 tablet    Refill:  1    Daridorexant HCl (QUVIVIQ) 50 MG TABS    Sig: Take 1 tablet by mouth at bedtime.    Dispense:  90 tablet  Refill:  1   Zoster Vaccine Adjuvanted [facility]) injection    Sig: Inject 0.5 mLs into the muscle once for 1 dose.    Dispense:  0.5 mL    Refill:  1   In addition to time spent on CPE, I spent 50 minutes in preparing to see the patient by review of recent labs and images, obtaining and reviewing separately obtained history, communicating with the patient and his hematologist, ordering medications, and documenting clinical information in the EHR including the differential Dx, treatment, and any further evaluation and management of multiple complex medical issues.     Follow-up: Return in about 3 months (around 05/12/2022).  Scarlette Calico, MD

## 2022-02-09 NOTE — Patient Instructions (Signed)
Health Maintenance, Male Adopting a healthy lifestyle and getting preventive care are important in promoting health and wellness. Ask your health care provider about: The right schedule for you to have regular tests and exams. Things you can do on your own to prevent diseases and keep yourself healthy. What should I know about diet, weight, and exercise? Eat a healthy diet  Eat a diet that includes plenty of vegetables, fruits, low-fat dairy products, and lean protein. Do not eat a lot of foods that are high in solid fats, added sugars, or sodium. Maintain a healthy weight Body mass index (BMI) is a measurement that can be used to identify possible weight problems. It estimates body fat based on height and weight. Your health care provider can help determine your BMI and help you achieve or maintain a healthy weight. Get regular exercise Get regular exercise. This is one of the most important things you can do for your health. Most adults should: Exercise for at least 150 minutes each week. The exercise should increase your heart rate and make you sweat (moderate-intensity exercise). Do strengthening exercises at least twice a week. This is in addition to the moderate-intensity exercise. Spend less time sitting. Even light physical activity can be beneficial. Watch cholesterol and blood lipids Have your blood tested for lipids and cholesterol at 76 years of age, then have this test every 5 years. You may need to have your cholesterol levels checked more often if: Your lipid or cholesterol levels are high. You are older than 76 years of age. You are at high risk for heart disease. What should I know about cancer screening? Many types of cancers can be detected early and may often be prevented. Depending on your health history and family history, you may need to have cancer screening at various ages. This may include screening for: Colorectal cancer. Prostate cancer. Skin cancer. Lung  cancer. What should I know about heart disease, diabetes, and high blood pressure? Blood pressure and heart disease High blood pressure causes heart disease and increases the risk of stroke. This is more likely to develop in people who have high blood pressure readings or are overweight. Talk with your health care provider about your target blood pressure readings. Have your blood pressure checked: Every 3-5 years if you are 18-39 years of age. Every year if you are 40 years old or older. If you are between the ages of 65 and 75 and are a current or former smoker, ask your health care provider if you should have a one-time screening for abdominal aortic aneurysm (AAA). Diabetes Have regular diabetes screenings. This checks your fasting blood sugar level. Have the screening done: Once every three years after age 45 if you are at a normal weight and have a low risk for diabetes. More often and at a younger age if you are overweight or have a high risk for diabetes. What should I know about preventing infection? Hepatitis B If you have a higher risk for hepatitis B, you should be screened for this virus. Talk with your health care provider to find out if you are at risk for hepatitis B infection. Hepatitis C Blood testing is recommended for: Everyone born from 1945 through 1965. Anyone with known risk factors for hepatitis C. Sexually transmitted infections (STIs) You should be screened each year for STIs, including gonorrhea and chlamydia, if: You are sexually active and are younger than 76 years of age. You are older than 76 years of age and your   health care provider tells you that you are at risk for this type of infection. Your sexual activity has changed since you were last screened, and you are at increased risk for chlamydia or gonorrhea. Ask your health care provider if you are at risk. Ask your health care provider about whether you are at high risk for HIV. Your health care provider  may recommend a prescription medicine to help prevent HIV infection. If you choose to take medicine to prevent HIV, you should first get tested for HIV. You should then be tested every 3 months for as long as you are taking the medicine. Follow these instructions at home: Alcohol use Do not drink alcohol if your health care provider tells you not to drink. If you drink alcohol: Limit how much you have to 0-2 drinks a day. Know how much alcohol is in your drink. In the [place] one drink equals one 12 oz bottle of beer (355 mL), one 5 oz glass of wine (148 mL), or one 1 oz glass of hard liquor (44 mL). Lifestyle Do not use any products that contain nicotine or tobacco. These products include cigarettes, chewing tobacco, and vaping devices, such as e-cigarettes. If you need help quitting, ask your health care provider. Do not use street drugs. Do not share needles. Ask your health care provider for help if you need support or information about quitting drugs. General instructions Schedule regular health, dental, and eye exams. Stay current with your vaccines. Tell your health care provider if: You often feel depressed. You have ever been abused or do not feel safe at home. Summary Adopting a healthy lifestyle and getting preventive care are important in promoting health and wellness. Follow your health care provider's instructions about healthy diet, exercising, and getting tested or screened for diseases. Follow your health care provider's instructions on monitoring your cholesterol and blood pressure. This information is not intended to replace advice given to you by your health care provider. Make sure you discuss any questions you have with your health care provider. Document Revised: 11/09/2020 Document Reviewed: 11/09/2020 Elsevier Patient Education  2023 [employer]  

## 2022-02-10 ENCOUNTER — Other Ambulatory Visit: Payer: Self-pay | Admitting: Internal Medicine

## 2022-02-10 ENCOUNTER — Other Ambulatory Visit: Payer: Self-pay | Admitting: Physician Assistant

## 2022-02-10 ENCOUNTER — Telehealth: Payer: Self-pay | Admitting: Internal Medicine

## 2022-02-10 DIAGNOSIS — I2510 Atherosclerotic heart disease of native coronary artery without angina pectoris: Secondary | ICD-10-CM

## 2022-02-10 DIAGNOSIS — E785 Hyperlipidemia, unspecified: Secondary | ICD-10-CM

## 2022-02-10 MED ORDER — PANTOPRAZOLE SODIUM 40 MG PO TBEC: 40.0000 mg | DELAYED_RELEASE_TABLET | Freq: Every day | ORAL | 1 refills | Status: DC

## 2022-02-10 NOTE — Telephone Encounter (Signed)
Scheduled per 8/10 in basket, message has been left

## 2022-02-10 NOTE — Telephone Encounter (Signed)
Pt has been scheduled for Monday 02/14/22 but states he is feeling upset and a bit confused as to why he is needing to see [place] here in the [place] since he has a follow-up appt in October.   I advised the pt that we received notification from his PCP's office that his hgb was low at 7.6, therefore he needs to be seen before his previously scheduled follow-up. Pt states he was not aware of any of this and wishes someone would have contacted him about his results. I apologized for his experience and advised I will let his PCP's office know. Pt expressed understanding of this information.

## 2022-02-11 DIAGNOSIS — F5104 Psychophysiologic insomnia: Secondary | ICD-10-CM | POA: Insufficient documentation

## 2022-02-11 MED ORDER — QUVIVIQ 50 MG PO TABS: 1.0000 | ORAL_TABLET | Freq: Every day | ORAL | 1 refills | Status: DC

## 2022-02-11 MED ORDER — SHINGRIX 50 MCG/0.5ML IM SUSR: 0.5000 mL | Freq: Once | INTRAMUSCULAR | 1 refills | Status: AC

## 2022-02-14 ENCOUNTER — Inpatient Hospital Stay: Payer: Medicare Other | Attending: Internal Medicine | Admitting: Internal Medicine

## 2022-02-14 ENCOUNTER — Other Ambulatory Visit: Payer: Self-pay

## 2022-02-14 ENCOUNTER — Encounter: Payer: Self-pay | Admitting: Internal Medicine

## 2022-02-14 ENCOUNTER — Inpatient Hospital Stay: Payer: Medicare Other

## 2022-02-14 VITALS — BP 157/69 | HR 104 | Temp 98.4°F | Resp 15 | Wt 246.7 lb

## 2022-02-14 VITALS — BP 118/46 | HR 71 | Temp 97.8°F | Resp 17

## 2022-02-14 DIAGNOSIS — D509 Iron deficiency anemia, unspecified: Secondary | ICD-10-CM

## 2022-02-14 DIAGNOSIS — D500 Iron deficiency anemia secondary to blood loss (chronic): Secondary | ICD-10-CM

## 2022-02-14 MED ORDER — ACETAMINOPHEN 325 MG PO TABS
650.0000 mg | ORAL_TABLET | Freq: Once | ORAL | Status: AC
  Administered 2022-02-14: 650 mg via ORAL

## 2022-02-14 MED ORDER — LORATADINE 10 MG PO TABS
10.0000 mg | ORAL_TABLET | Freq: Once | ORAL | Status: AC
  Administered 2022-02-14: 10 mg via ORAL

## 2022-02-14 MED ORDER — SODIUM CHLORIDE 0.9 % IV SOLN
300.0000 mg | Freq: Once | INTRAVENOUS | Status: AC
  Administered 2022-02-14: 300 mg via INTRAVENOUS
  Filled 2022-02-14: qty 300

## 2022-02-14 MED ORDER — SODIUM CHLORIDE 0.9 % IV SOLN: Freq: Once | INTRAVENOUS | Status: AC

## 2022-02-14 MED ORDER — DIPHENHYDRAMINE HCL 25 MG PO CAPS: 50.0000 mg | ORAL_CAPSULE | Freq: Once | ORAL | Status: DC

## 2022-02-14 NOTE — Progress Notes (Signed)
[place] [phone]   [phone]  OFFICE PROGRESS NOTE  [name], MD [place] [address]  DIAGNOSIS: Microcytic anemia secondary to iron deficiency secondary to gastrointestinal hemorrhage.  PRIOR THERAPY: Iron infusion with Venofer 300 Mg IV weekly on as-needed basis.  CURRENT THERAPY: Ferrous sulfate 325 mg p.o. daily.  INTERVAL HISTORY: Aaron [name] 76 y.o. male returns to the clinic today for follow-up visit accompanied by his wife.  The patient is complaining of lightheadedness and shortness of breath with exertion.  He was seen recently by his primary care physician and repeat blood work showed significant iron deficiency anemia secondary to gastrointestinal bleeding.  He denied having any current chest pain, cough or hemoptysis.  He has no nausea, vomiting, diarrhea or constipation.  He has no headache or visual changes.  He is here today for evaluation and treatment of his condition.   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  MEDICATIONS:  Current Outpatient Medications  Medication  Sig Dispense Refill   amLODipine (NORVASC) 5 MG tablet TAKE 1 TABLET BY MOUTH EVERYDAY AT BEDTIME 90 tablet 0   atorvastatin (LIPITOR) 80 MG tablet TAKE 1 TABLET BY MOUTH ONCE  DAILY 100 tablet 0   Carboxymethylcellulose Sodium 1 % GEL Place 1 application into both eyes at bedtime as needed (Dry eye).     clonazePAM (KLONOPIN) 0.5 MG tablet Take 1 tablet (0.5 mg total) by mouth at bedtime. 90 tablet 1   Daridorexant HCl (QUVIVIQ) 50 MG TABS Take 1 tablet by mouth at bedtime. 90 tablet 1   ezetimibe (ZETIA) 10 MG tablet TAKE 1 TABLET BY MOUTH  DAILY 90 tablet 3   ferrous sulfate 325 (65 FE) MG EC tablet TAKE 1 TABLET BY MOUTH DAILY WITH BREAKFAST. SCHEDULE AN APPOINTMENT FOR FURTHER REFILLS 60 tablet 3   pantoprazole (PROTONIX) 40 MG tablet Take 1 tablet (40 mg total) by mouth daily. 90 tablet 1   polyethylene glycol (MIRALAX / GLYCOLAX) 17 g packet Take 17 g by mouth daily. With coffee     telmisartan-hydrochlorothiazide (MICARDIS HCT) 80-25 MG tablet Take 1 tablet by mouth daily. 90 tablet 1   tiZANidine (ZANAFLEX) 4 MG tablet Take 4 mg by mouth every 8 (eight) hours as needed.     traMADol (ULTRAM) 50 MG tablet TAKE 2 TABLETS (100 MG TOTAL) BY MOUTH EVERY 6 (SIX)  HOURS AS NEEDED FOR MODERATE PAIN - SEVERE PAIN 240 tablet 0   traZODone (DESYREL) 50 MG tablet Take 1 tablet (50 mg total) by mouth at bedtime. 90 tablet 1   venlafaxine XR (EFFEXOR-XR) 150 MG 24 hr capsule TAKE 1 CAPSULE BY MOUTH DAILY  WITH BREAKFAST 90 capsule 0   XARELTO 20 MG TABS tablet TAKE 1 TABLET BY MOUTH DAILY  WITH SUPPER 90 tablet 0   No current facility-administered medications for this visit.    SURGICAL HISTORY:  Past Surgical History:  Procedure Laterality Date   CARDIAC CATHETERIZATION     carpel tunnel release Bilateral ~ 06/2005/~ 08/2005   right/left   COLONOSCOPY W/ BIOPSIES AND POLYPECTOMY     CORONARY ANGIOPLASTY     CORONARY STENT PLACEMENT  02/13/2004   1   ENUCLEATION Right    age 34   EYE SURGERY      age 23; "ruptured  right pupil"   EYE SURGERY     age 58   [place]   placed artificial right eye   INGUINAL HERNIA REPAIR Right 1983   INGUINAL HERNIA REPAIR Left 1985   KNEE ARTHROSCOPY  ~ 06/2005   right   MULTIPLE TOOTH EXTRACTIONS     spinal injections  2006-2007   "maybe 10 injections for herniated discs"   TONSILLECTOMY     "when I was real young"   TOTAL KNEE ARTHROPLASTY Left 10/09/2015   Procedure: TOTAL KNEE ARTHROPLASTY;  Surgeon: Dorna Leitz, MD;  Location: [place];  Service: Orthopedics;  Laterality: Left;   TOTAL KNEE ARTHROPLASTY Right 04/04/2016   Procedure: TOTAL KNEE ARTHROPLASTY;  Surgeon: Dorna Leitz, MD;  Location: [place];  Service: Orthopedics;  Laterality: Right;    REVIEW OF SYSTEMS:  A comprehensive review of systems was negative except for: Constitutional: positive for fatigue Respiratory: positive for dyspnea on exertion   PHYSICAL EXAMINATION: General appearance: alert, cooperative, fatigued, and no distress Head: Normocephalic, without obvious abnormality, atraumatic Neck: no adenopathy, no JVD, supple, symmetrical, trachea midline, and thyroid not enlarged, symmetric, no tenderness/mass/nodules Lymph nodes: Cervical, supraclavicular, and axillary nodes normal. Resp: clear to auscultation bilaterally Back: symmetric, no curvature. ROM normal. No CVA tenderness. Cardio: regular rate and rhythm, S1, S2 normal, no murmur, click, rub or gallop GI: soft, non-tender; bowel sounds normal; no masses,  no organomegaly Extremities: extremities normal, atraumatic, no cyanosis or edema  ECOG PERFORMANCE STATUS: 1 - Symptomatic but completely ambulatory  Blood pressure (!) 157/69, pulse (!) 104, temperature 98.4 F (36.9 C), temperature source Oral, resp. rate 15, weight 246 lb 11.2 oz (111.9 kg), SpO2 98 %.  LABORATORY DATA: Lab Results  Component Value Date   WBC 7.9 02/09/2022   HGB 7.6 Repeated and verified X2. (LL) 02/09/2022   HCT 23.5 Repeated  and verified X2. (LL) 02/09/2022   MCV 87.6 02/09/2022   PLT 339.0 02/09/2022      Chemistry      Component Value Date/Time   NA 135 08/10/2021 1520   K 4.3 08/10/2021 1520   CL 100 08/10/2021 1520   CO2 29 08/10/2021 1520   BUN 13 08/10/2021 1520   CREATININE 1.02 08/10/2021 1520   CREATININE 1.24 06/15/2020 1306   CREATININE 1.16 02/10/2020 1532      Component Value Date/Time   CALCIUM 8.8 08/10/2021 1520   ALKPHOS 94 08/10/2021 1520   AST 14 08/10/2021 1520   AST 17 06/15/2020 1306   ALT 13 08/10/2021 1520   ALT 12  06/15/2020 1306   BILITOT 0.3 08/10/2021 1520   BILITOT 0.3 12/17/2020 1017   BILITOT 0.4 06/15/2020 1306       RADIOGRAPHIC STUDIES: No results found.  ASSESSMENT AND PLAN: This is a very pleasant 76 years old white male with history of iron deficiency anemia secondary to chronic gastrointestinal blood loss. The patient is currently on oral iron tablet with ferrous sulfate 325 mg p.o. daily and tolerating this treatment well. The patient was found recently to have significant iron deficiency anemia. I recommended for him to proceed with iron infusion with Venofer 300 Mg IV weekly for 3 weeks.  He will start the first dose of this treatment today. I will see him back for follow-up visit in 2 months for evaluation and repeat blood work. The patient was advised to call immediately if he has any other concerning symptoms in the interval. The patient voices understanding of current disease status and treatment options and is in agreement with the current care plan.  All questions were answered. The patient knows to call the clinic with any problems, questions or concerns. We can certainly see the patient much sooner if necessary.  The total time spent in the appointment was 20 minutes.  Disclaimer: This note was dictated with voice recognition software. Similar sounding words can inadvertently be transcribed and may not be corrected upon review.

## 2022-02-14 NOTE — Patient Instructions (Signed)

## 2022-02-16 ENCOUNTER — Telehealth: Payer: Self-pay | Admitting: Internal Medicine

## 2022-02-16 LAB — ALDOSTERONE + RENIN ACTIVITY W/ RATIO
ALDO / PRA Ratio: 1.9 Ratio (ref 0.9–28.9)
Aldosterone: 6 ng/dL
Renin Activity: 3.11 ng/mL/h (ref 0.25–5.82)

## 2022-02-16 NOTE — Telephone Encounter (Signed)
Scheduled per 08/14 los, patient has been called and voicemail was left.

## 2022-02-21 ENCOUNTER — Ambulatory Visit: Payer: Medicare Other | Admitting: Cardiology

## 2022-02-22 ENCOUNTER — Other Ambulatory Visit: Payer: Self-pay

## 2022-02-22 ENCOUNTER — Inpatient Hospital Stay: Payer: Medicare Other

## 2022-02-22 VITALS — BP 125/55 | HR 62 | Temp 98.0°F | Resp 18

## 2022-02-22 DIAGNOSIS — D509 Iron deficiency anemia, unspecified: Secondary | ICD-10-CM

## 2022-02-22 DIAGNOSIS — D500 Iron deficiency anemia secondary to blood loss (chronic): Secondary | ICD-10-CM | POA: Diagnosis not present

## 2022-02-22 MED ORDER — ACETAMINOPHEN 325 MG PO TABS
650.0000 mg | ORAL_TABLET | Freq: Once | ORAL | Status: AC
  Administered 2022-02-22: 650 mg via ORAL
  Filled 2022-02-22: qty 2

## 2022-02-22 MED ORDER — SODIUM CHLORIDE 0.9 % IV SOLN
300.0000 mg | Freq: Once | INTRAVENOUS | Status: AC
  Administered 2022-02-22: 300 mg via INTRAVENOUS
  Filled 2022-02-22: qty 300

## 2022-02-22 MED ORDER — SODIUM CHLORIDE 0.9 % IV SOLN: Freq: Once | INTRAVENOUS | Status: AC

## 2022-02-22 MED ORDER — LORATADINE 10 MG PO TABS
10.0000 mg | ORAL_TABLET | Freq: Once | ORAL | Status: AC
  Administered 2022-02-22: 10 mg via ORAL
  Filled 2022-02-22: qty 1

## 2022-02-22 NOTE — Patient Instructions (Signed)

## 2022-02-22 NOTE — Progress Notes (Signed)
Pt declined to stay for 30 minute observation period post Venofer infusion. VSS. No complaints at time of discharge.  

## 2022-02-24 ENCOUNTER — Telehealth: Payer: Self-pay | Admitting: Internal Medicine

## 2022-02-24 NOTE — Telephone Encounter (Signed)
Good Afternoon Dr Henrene Pastor,  We have received a referral from patients primary care provider in regards to patient being seen for iron deficiency anemia and blood in stools.  Patient was a previous patient with [place] back in 2002 and would like to return back to our practice to reestablish care.  Patient was last seen at the [facility] for a procedure in June of 2012 by Dr Man. I am sending records up for review along with a pathology report.  Please review records and advise on scheduling.  Thank you

## 2022-02-28 ENCOUNTER — Other Ambulatory Visit: Payer: Self-pay

## 2022-02-28 ENCOUNTER — Inpatient Hospital Stay: Payer: Medicare Other

## 2022-02-28 VITALS — BP 116/54 | HR 67 | Temp 98.0°F | Resp 18

## 2022-02-28 DIAGNOSIS — D500 Iron deficiency anemia secondary to blood loss (chronic): Secondary | ICD-10-CM | POA: Diagnosis not present

## 2022-02-28 DIAGNOSIS — D509 Iron deficiency anemia, unspecified: Secondary | ICD-10-CM

## 2022-02-28 MED ORDER — LORATADINE 10 MG PO TABS
10.0000 mg | ORAL_TABLET | Freq: Once | ORAL | Status: AC
  Administered 2022-02-28: 10 mg via ORAL
  Filled 2022-02-28: qty 1

## 2022-02-28 MED ORDER — SODIUM CHLORIDE 0.9 % IV SOLN
300.0000 mg | Freq: Once | INTRAVENOUS | Status: AC
  Administered 2022-02-28: 300 mg via INTRAVENOUS
  Filled 2022-02-28: qty 300

## 2022-02-28 MED ORDER — ACETAMINOPHEN 325 MG PO TABS
650.0000 mg | ORAL_TABLET | Freq: Once | ORAL | Status: AC
  Administered 2022-02-28: 650 mg via ORAL
  Filled 2022-02-28: qty 2

## 2022-02-28 MED ORDER — SODIUM CHLORIDE 0.9 % IV SOLN: Freq: Once | INTRAVENOUS | Status: AC

## 2022-02-28 NOTE — Progress Notes (Signed)
Patient declined to stay for 30 minute post venofer observation. VSS and ambulatory at discharge.

## 2022-02-28 NOTE — Patient Instructions (Signed)

## 2022-03-02 NOTE — Telephone Encounter (Signed)
GI RECORDS REVIEWED.  Reviewed.  Patient with iron deficiency anemia on anticoagulation.  Has been followed by Dr. Collene Mares.  Seen here remotely.  Wants to return to [facility].  Okay to schedule with advanced practitioner or myself.  Docia Chuck. Geri Seminole., M.D. [facility] Division of Gastroenterology

## 2022-03-03 DIAGNOSIS — M25562 Pain in left knee: Secondary | ICD-10-CM | POA: Diagnosis not present

## 2022-03-03 DIAGNOSIS — M5441 Lumbago with sciatica, right side: Secondary | ICD-10-CM | POA: Diagnosis not present

## 2022-03-03 DIAGNOSIS — M5442 Lumbago with sciatica, left side: Secondary | ICD-10-CM | POA: Diagnosis not present

## 2022-03-03 DIAGNOSIS — M25561 Pain in right knee: Secondary | ICD-10-CM | POA: Diagnosis not present

## 2022-03-04 ENCOUNTER — Encounter: Payer: Self-pay | Admitting: Cardiology

## 2022-03-04 ENCOUNTER — Encounter: Payer: Self-pay | Admitting: Internal Medicine

## 2022-03-04 ENCOUNTER — Ambulatory Visit: Payer: Medicare Other | Attending: Cardiology | Admitting: Cardiology

## 2022-03-04 VITALS — BP 144/60 | HR 90 | Ht 66.0 in | Wt 252.0 lb

## 2022-03-04 DIAGNOSIS — I2510 Atherosclerotic heart disease of native coronary artery without angina pectoris: Secondary | ICD-10-CM

## 2022-03-04 DIAGNOSIS — I10 Essential (primary) hypertension: Secondary | ICD-10-CM | POA: Diagnosis not present

## 2022-03-04 DIAGNOSIS — E7800 Pure hypercholesterolemia, unspecified: Secondary | ICD-10-CM

## 2022-03-04 NOTE — Progress Notes (Signed)
Cardiology Office Note:    Date:  03/04/2022   ID:  Aaron [name], DOB [birth year], MRN [id number]  PCP:  [name], MD  Cardiologist:  Terrance Usery Martinique, MD   Referring MD: [name], MD   Chief Complaint  Patient presents with   Follow-up    1 year.   Headache   Shortness of Breath   Edema    Some in his legs.     History of Present Illness:    Aaron [name] is a 76 y.o. male with a hx of hypertension, coronary artery disease, recurrent PE/DVTs on xarelto, GERD, hyperlipidemia, and obesity.  He has a history of stenting to his mid LAD in 2005 following a nuclear stress test that showed evidence of anterior apical ischemia.  Follow-up Myoview in 2014 was normal.  He was anticoagulated on Xarelto.  He did have repeat Myoview in Nov 2021 that was normal.   He is limited s/p bilateral TKR but still does all his own yard work. He was seen by ortho and just started on prednisone therapy yesterday. Also may get an Epidural injection.    He is followed by hematology and  has received iron infusions for anemia. ? If he has some blood in stool. To follow up with Dr Henrene Pastor with GI.   He does note more SOB the last 4 weeks. No chest pain.    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"   Heart murmur    Hyperlipidemia    Hypertension    Insomnia    Osteoarthritis    PONV (postoperative nausea and vomiting)    2006 - knee surgery   Pre-diabetes    Psoriasis    PTSD (post-traumatic stress disorder)    Pulmonary embolism ([place]) ~ 03/2006   bilaterally   Seasonal allergies    Shortness of breath 06/08/11   "w/exertion; that's why I'm here"   Sleep  apnea, obstructive    Umbilical hernia     Past Surgical History:  Procedure Laterality Date   CARDIAC CATHETERIZATION     carpel tunnel release Bilateral ~ 06/2005/~ 08/2005   right/left   COLONOSCOPY W/ BIOPSIES AND POLYPECTOMY     CORONARY ANGIOPLASTY     CORONARY STENT PLACEMENT  02/13/2004   1   ENUCLEATION Right    age 64   EYE SURGERY     age 36; "ruptured  right pupil"   EYE SURGERY     age 76   [place]   placed artificial right eye   INGUINAL HERNIA REPAIR Right [place] Left 1985   KNEE ARTHROSCOPY  ~ 06/2005   right   MULTIPLE TOOTH EXTRACTIONS     spinal injections  2006-2007   "maybe 10 injections for herniated discs"   TONSILLECTOMY     "when I was real young"   TOTAL KNEE ARTHROPLASTY Left 10/09/2015   Procedure: TOTAL KNEE ARTHROPLASTY;  Surgeon: Dorna Leitz, MD;  Location: [place];  Service:  Orthopedics;  Laterality: Left;   TOTAL KNEE ARTHROPLASTY Right 04/04/2016   Procedure: TOTAL KNEE ARTHROPLASTY;  Surgeon: Dorna Leitz, MD;  Location: [place];  Service: Orthopedics;  Laterality: Right;    Current Medications: Current Meds  Medication Sig   amLODipine (NORVASC) 5 MG tablet TAKE 1 TABLET BY MOUTH EVERYDAY AT BEDTIME   atorvastatin (LIPITOR) 80 MG tablet TAKE 1 TABLET BY MOUTH ONCE  DAILY   Carboxymethylcellulose Sodium 1 % GEL Place 1 application into both eyes at bedtime as needed (Dry eye).   clonazePAM (KLONOPIN) 0.5 MG tablet Take 1 tablet (0.5 mg total) by mouth at bedtime.   Daridorexant HCl (QUVIVIQ) 50 MG TABS Take 1 tablet by mouth at bedtime.   ezetimibe (ZETIA) 10 MG tablet TAKE 1 TABLET BY MOUTH  DAILY   ferrous sulfate 325 (65 FE) MG EC tablet TAKE 1 TABLET BY MOUTH DAILY WITH BREAKFAST. SCHEDULE AN APPOINTMENT FOR FURTHER REFILLS   pantoprazole (PROTONIX) 40 MG tablet Take 1 tablet (40 mg total) by mouth daily.   polyethylene glycol (MIRALAX / GLYCOLAX) 17 g packet Take 17 g by mouth daily. With coffee   PREDNISONE  PO Take by mouth.   telmisartan-hydrochlorothiazide (MICARDIS HCT) 80-25 MG tablet Take 1 tablet by mouth daily.   tiZANidine (ZANAFLEX) 4 MG tablet Take 4 mg by mouth every 8 (eight) hours as needed.   traMADol (ULTRAM) 50 MG tablet TAKE 2 TABLETS (100 MG TOTAL) BY MOUTH EVERY 6 (SIX) HOURS AS NEEDED FOR MODERATE PAIN - SEVERE PAIN   traZODone (DESYREL) 50 MG tablet Take 1 tablet (50 mg total) by mouth at bedtime.   venlafaxine XR (EFFEXOR-XR) 150 MG 24 hr capsule TAKE 1 CAPSULE BY MOUTH DAILY  WITH BREAKFAST   XARELTO 20 MG TABS tablet TAKE 1 TABLET BY MOUTH DAILY  WITH SUPPER     Allergies:   Wellbutrin [bupropion] and Amoxicillin   Social History   Socioeconomic History   Marital status: Married    Spouse name: Not on file   Number of children: 3   Years of education: Not on file   Highest education level: Not on file  Occupational History   Occupation: building maintenance    Comment: disabled  Tobacco Use   Smoking status: Former    Packs/day: 1.50    Years: 25.00    Total pack years: 37.50    Types: Cigarettes    Quit date: 07/10/1995    Years since quitting: 26.6   Smokeless tobacco: Never  Vaping Use   Vaping Use: Never used  Substance and Sexual Activity   Alcohol use: No   Drug use: No   Sexual activity: Not on file  Other Topics Concern   Not on file  Social History Narrative   Former Curator, 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   Recent stress, currently undergoing financial difficulties.   Social Determinants of Health   Financial Resource Strain: Low Risk  (10/29/2021)   Overall Financial Resource Strain (CARDIA)    Difficulty of Paying Living Expenses: Not hard at all  Food Insecurity: No Food Insecurity (10/29/2021)   Hunger Vital Sign    Worried About Running Out of Food in the Last Year: Never true    Ran Out of Food in the Last Year: Never true  Transportation Needs: No Transportation Needs (10/29/2021)   PRAPARE - Civil engineer, contracting (Medical): No    Lack of Transportation (Non-Medical): No  Physical Activity: Insufficiently  Active (10/29/2021)   Exercise Vital Sign    Days of Exercise per Week: 3 days    Minutes of Exercise per Session: 30 min  Stress: No Stress Concern Present (10/29/2021)   [place]    Feeling of Stress : Not at all  Social Connections: Socially Isolated (10/29/2021)   Social Connection and Isolation Panel [NHANES]    Frequency of Communication with Friends and Family: Three times a week    Frequency of Social Gatherings with Friends and Family: Three times a week    Attends Religious Services: Never    Active Member of Clubs or Organizations: No    Attends Archivist Meetings: Never    Marital Status: Widowed     Family History: The patient's family history includes Asthma in his brother, father, and sister; Breast cancer in his sister; Emphysema in his father; Liver cancer in his sister.  ROS:   Please see the history of present illness.     All other systems reviewed and are negative.  EKGs/Labs/Other Studies Reviewed:    The following studies were reviewed today:  Myoview 05/22/20: Study Highlights    The left ventricular ejection fraction is normal (55-65%). Nuclear stress EF: 56%. There was no ST segment deviation noted during stress. No T wave inversion was noted during stress. The study is normal. This is a low risk study.   Low risk stress nuclear study with normal perfusion and normal left ventricular regional and global systolic function.      EKG:  EKG is not ordered today.    Recent Labs: 08/10/2021: ALT 13; BUN 13; Creatinine, Ser 1.02; Potassium 4.3; Sodium 135 02/09/2022: Hemoglobin 7.6 Repeated and verified X2.; Platelets 339.0; TSH 2.44  Recent Lipid Panel    Component Value Date/Time   CHOL 126 08/10/2021 1520   CHOL 151 12/17/2020 1017   TRIG 214.0 (H) 08/10/2021  1520   HDL 37.70 (L) 08/10/2021 1520   HDL 40 12/17/2020 1017   CHOLHDL 3 08/10/2021 1520   VLDL 42.8 (H) 08/10/2021 1520   LDLCALC 89 12/17/2020 1017   LDLDIRECT 62.0 08/10/2021 1520    Physical Exam:    VS:  BP (!) 144/60 (BP Location: Left Arm, Patient Position: Sitting, Cuff Size: Normal)   Pulse 90   Ht '5\' 6"'$  (1.676 m)   Wt 252 lb (114.3 kg)   BMI 40.67 kg/m     Wt Readings from Last 3 Encounters:  03/04/22 252 lb (114.3 kg)  02/14/22 246 lb 11.2 oz (111.9 kg)  02/09/22 247 lb 2 oz (112.1 kg)     GEN: Well nourished, obese,  in no acute distress HEENT: Normal NECK: No JVD; No carotid bruits CARDIAC: RRR, no murmurs, rubs, gallops RESPIRATORY:  Clear to auscultation without rales, wheezing or rhonchi  ABDOMEN: Soft, non-tender, non-distended MUSCULOSKELETAL:  No edema; No deformity  SKIN: Warm and dry NEUROLOGIC:  Alert and oriented x 3 PSYCHIATRIC:  Normal affect   ASSESSMENT:    1. Coronary artery disease involving native coronary artery of native heart without angina pectoris   2. Pure hypercholesterolemia   3. Essential hypertension      PLAN:    In order of problems listed above:  1. Coronary artery disease involving native coronary artery of native heart  Remote stenting of the LAD in 2005. Not on ASA due to need for anticoagulation. Continue statin.  Myoview study in Nov 2021 was normal. No significant angina even  with severe anemia  2. Essential hypertension Fairly well controlled on current regimen.  3. Other pulmonary embolism without acute cor pulmonale, unspecified chronicity (HCC) Deep vein thrombosis (DVT) of proximal lower extremity, unspecified chronicity, unspecified laterality ([place]) Long term current use of anticoagulant He is compliant on Xarelto.   4. Hypercholesterolemia. Goal LDL < 70. He is on maximal statin dose and Zetia. LDL improved to 62  5. Iron deficiency anemia. Getting iron infusions. Will proceed with GI  evaluation    Follow-up in 1 year   Medication Adjustments/Labs and Tests Ordered: Current medicines are reviewed at length with the patient today.  Concerns regarding medicines are outlined above.  No orders of the defined types were placed in this encounter.   No orders of the defined types were placed in this encounter.    Signed, Orvan Papadakis Martinique, MD  03/04/2022 4:32 PM    [facility]

## 2022-03-06 ENCOUNTER — Other Ambulatory Visit: Payer: Self-pay | Admitting: Internal Medicine

## 2022-03-06 DIAGNOSIS — I2699 Other pulmonary embolism without acute cor pulmonale: Secondary | ICD-10-CM

## 2022-03-08 DIAGNOSIS — I739 Peripheral vascular disease, unspecified: Secondary | ICD-10-CM | POA: Diagnosis not present

## 2022-03-08 DIAGNOSIS — L603 Nail dystrophy: Secondary | ICD-10-CM | POA: Diagnosis not present

## 2022-03-08 DIAGNOSIS — L84 Corns and callosities: Secondary | ICD-10-CM | POA: Diagnosis not present

## 2022-03-09 ENCOUNTER — Telehealth: Payer: Self-pay | Admitting: Internal Medicine

## 2022-03-09 ENCOUNTER — Other Ambulatory Visit: Payer: Self-pay | Admitting: Internal Medicine

## 2022-03-09 DIAGNOSIS — M170 Bilateral primary osteoarthritis of knee: Secondary | ICD-10-CM

## 2022-03-09 DIAGNOSIS — F411 Generalized anxiety disorder: Secondary | ICD-10-CM

## 2022-03-09 DIAGNOSIS — G4700 Insomnia, unspecified: Secondary | ICD-10-CM

## 2022-03-09 DIAGNOSIS — F32A Depression, unspecified: Secondary | ICD-10-CM

## 2022-03-09 NOTE — Telephone Encounter (Signed)
Rescheduled 10/14 appointment to 10/13 due to provider on-call. Patient has been called and notified.

## 2022-03-24 ENCOUNTER — Emergency Department (HOSPITAL_COMMUNITY): Payer: Medicare Other

## 2022-03-24 ENCOUNTER — Inpatient Hospital Stay (HOSPITAL_COMMUNITY): Payer: Medicare Other | Admitting: Anesthesiology

## 2022-03-24 ENCOUNTER — Encounter (HOSPITAL_COMMUNITY)
Admission: EM | Disposition: A | Discharge status: Home Health | Payer: Self-pay | Source: Home / Self Care | Attending: Internal Medicine

## 2022-03-24 ENCOUNTER — Other Ambulatory Visit: Payer: Self-pay

## 2022-03-24 ENCOUNTER — Inpatient Hospital Stay (HOSPITAL_COMMUNITY)
Admission: EM | Admit: 2022-03-24 | Discharge: 2022-04-04 | DRG: 280 | Disposition: A | Discharge status: Home Health | Payer: Medicare Other | Attending: Internal Medicine | Admitting: Internal Medicine

## 2022-03-24 ENCOUNTER — Inpatient Hospital Stay (HOSPITAL_COMMUNITY): Payer: Medicare Other

## 2022-03-24 ENCOUNTER — Encounter (HOSPITAL_COMMUNITY): Payer: Self-pay | Admitting: Emergency Medicine

## 2022-03-24 DIAGNOSIS — I5021 Acute systolic (congestive) heart failure: Secondary | ICD-10-CM

## 2022-03-24 DIAGNOSIS — I130 Hypertensive heart and chronic kidney disease with heart failure and stage 1 through stage 4 chronic kidney disease, or unspecified chronic kidney disease: Secondary | ICD-10-CM | POA: Diagnosis not present

## 2022-03-24 DIAGNOSIS — K219 Gastro-esophageal reflux disease without esophagitis: Secondary | ICD-10-CM | POA: Diagnosis present

## 2022-03-24 DIAGNOSIS — Z6841 Body Mass Index (BMI) 40.0 and over, adult: Secondary | ICD-10-CM

## 2022-03-24 DIAGNOSIS — K922 Gastrointestinal hemorrhage, unspecified: Secondary | ICD-10-CM | POA: Diagnosis not present

## 2022-03-24 DIAGNOSIS — W1830XA Fall on same level, unspecified, initial encounter: Secondary | ICD-10-CM | POA: Diagnosis present

## 2022-03-24 DIAGNOSIS — Z6833 Body mass index (BMI) 33.0-33.9, adult: Secondary | ICD-10-CM

## 2022-03-24 DIAGNOSIS — I4901 Ventricular fibrillation: Principal | ICD-10-CM | POA: Diagnosis present

## 2022-03-24 DIAGNOSIS — F32A Depression, unspecified: Secondary | ICD-10-CM | POA: Diagnosis present

## 2022-03-24 DIAGNOSIS — E669 Obesity, unspecified: Secondary | ICD-10-CM | POA: Diagnosis present

## 2022-03-24 DIAGNOSIS — I5043 Acute on chronic combined systolic (congestive) and diastolic (congestive) heart failure: Secondary | ICD-10-CM | POA: Diagnosis not present

## 2022-03-24 DIAGNOSIS — N189 Chronic kidney disease, unspecified: Secondary | ICD-10-CM | POA: Diagnosis present

## 2022-03-24 DIAGNOSIS — Z87891 Personal history of nicotine dependence: Secondary | ICD-10-CM

## 2022-03-24 DIAGNOSIS — I469 Cardiac arrest, cause unspecified: Secondary | ICD-10-CM | POA: Diagnosis not present

## 2022-03-24 DIAGNOSIS — L409 Psoriasis, unspecified: Secondary | ICD-10-CM | POA: Diagnosis present

## 2022-03-24 DIAGNOSIS — Z888 Allergy status to other drugs, medicaments and biological substances status: Secondary | ICD-10-CM

## 2022-03-24 DIAGNOSIS — T1490XA Injury, unspecified, initial encounter: Secondary | ICD-10-CM | POA: Diagnosis not present

## 2022-03-24 DIAGNOSIS — Z79899 Other long term (current) drug therapy: Secondary | ICD-10-CM

## 2022-03-24 DIAGNOSIS — K429 Umbilical hernia without obstruction or gangrene: Secondary | ICD-10-CM | POA: Diagnosis present

## 2022-03-24 DIAGNOSIS — Z803 Family history of malignant neoplasm of breast: Secondary | ICD-10-CM

## 2022-03-24 DIAGNOSIS — I499 Cardiac arrhythmia, unspecified: Secondary | ICD-10-CM | POA: Diagnosis not present

## 2022-03-24 DIAGNOSIS — G4733 Obstructive sleep apnea (adult) (pediatric): Secondary | ICD-10-CM | POA: Diagnosis present

## 2022-03-24 DIAGNOSIS — K31811 Angiodysplasia of stomach and duodenum with bleeding: Secondary | ICD-10-CM | POA: Diagnosis present

## 2022-03-24 DIAGNOSIS — D62 Acute posthemorrhagic anemia: Secondary | ICD-10-CM | POA: Diagnosis present

## 2022-03-24 DIAGNOSIS — I081 Rheumatic disorders of both mitral and tricuspid valves: Secondary | ICD-10-CM | POA: Diagnosis not present

## 2022-03-24 DIAGNOSIS — N182 Chronic kidney disease, stage 2 (mild): Secondary | ICD-10-CM | POA: Diagnosis present

## 2022-03-24 DIAGNOSIS — S0001XA Abrasion of scalp, initial encounter: Secondary | ICD-10-CM | POA: Diagnosis present

## 2022-03-24 DIAGNOSIS — M4602 Spinal enthesopathy, cervical region: Secondary | ICD-10-CM | POA: Diagnosis not present

## 2022-03-24 DIAGNOSIS — Z86718 Personal history of other venous thrombosis and embolism: Secondary | ICD-10-CM

## 2022-03-24 DIAGNOSIS — I5033 Acute on chronic diastolic (congestive) heart failure: Secondary | ICD-10-CM | POA: Diagnosis not present

## 2022-03-24 DIAGNOSIS — I2510 Atherosclerotic heart disease of native coronary artery without angina pectoris: Secondary | ICD-10-CM | POA: Diagnosis not present

## 2022-03-24 DIAGNOSIS — I10 Essential (primary) hypertension: Secondary | ICD-10-CM | POA: Diagnosis present

## 2022-03-24 DIAGNOSIS — I21A1 Myocardial infarction type 2: Secondary | ICD-10-CM | POA: Diagnosis present

## 2022-03-24 DIAGNOSIS — N179 Acute kidney failure, unspecified: Secondary | ICD-10-CM | POA: Diagnosis not present

## 2022-03-24 DIAGNOSIS — E785 Hyperlipidemia, unspecified: Secondary | ICD-10-CM | POA: Diagnosis present

## 2022-03-24 DIAGNOSIS — I4891 Unspecified atrial fibrillation: Secondary | ICD-10-CM | POA: Diagnosis not present

## 2022-03-24 DIAGNOSIS — D509 Iron deficiency anemia, unspecified: Secondary | ICD-10-CM | POA: Diagnosis not present

## 2022-03-24 DIAGNOSIS — Z880 Allergy status to penicillin: Secondary | ICD-10-CM

## 2022-03-24 DIAGNOSIS — D649 Anemia, unspecified: Secondary | ICD-10-CM | POA: Diagnosis not present

## 2022-03-24 DIAGNOSIS — Z85828 Personal history of other malignant neoplasm of skin: Secondary | ICD-10-CM

## 2022-03-24 DIAGNOSIS — I509 Heart failure, unspecified: Secondary | ICD-10-CM | POA: Diagnosis not present

## 2022-03-24 DIAGNOSIS — I462 Cardiac arrest due to underlying cardiac condition: Secondary | ICD-10-CM | POA: Diagnosis present

## 2022-03-24 DIAGNOSIS — R7303 Prediabetes: Secondary | ICD-10-CM | POA: Diagnosis not present

## 2022-03-24 DIAGNOSIS — R6889 Other general symptoms and signs: Secondary | ICD-10-CM | POA: Diagnosis not present

## 2022-03-24 DIAGNOSIS — Y92019 Unspecified place in single-family (private) house as the place of occurrence of the external cause: Secondary | ICD-10-CM | POA: Diagnosis not present

## 2022-03-24 DIAGNOSIS — K921 Melena: Secondary | ICD-10-CM

## 2022-03-24 DIAGNOSIS — Z743 Need for continuous supervision: Secondary | ICD-10-CM | POA: Diagnosis not present

## 2022-03-24 DIAGNOSIS — K31819 Angiodysplasia of stomach and duodenum without bleeding: Secondary | ICD-10-CM | POA: Diagnosis not present

## 2022-03-24 DIAGNOSIS — M1990 Unspecified osteoarthritis, unspecified site: Secondary | ICD-10-CM | POA: Diagnosis present

## 2022-03-24 DIAGNOSIS — Z955 Presence of coronary angioplasty implant and graft: Secondary | ICD-10-CM

## 2022-03-24 DIAGNOSIS — I248 Other forms of acute ischemic heart disease: Secondary | ICD-10-CM

## 2022-03-24 DIAGNOSIS — F4310 Post-traumatic stress disorder, unspecified: Secondary | ICD-10-CM | POA: Diagnosis present

## 2022-03-24 DIAGNOSIS — Z043 Encounter for examination and observation following other accident: Secondary | ICD-10-CM | POA: Diagnosis not present

## 2022-03-24 DIAGNOSIS — R404 Transient alteration of awareness: Secondary | ICD-10-CM | POA: Diagnosis not present

## 2022-03-24 DIAGNOSIS — I959 Hypotension, unspecified: Secondary | ICD-10-CM | POA: Diagnosis not present

## 2022-03-24 DIAGNOSIS — Z96653 Presence of artificial knee joint, bilateral: Secondary | ICD-10-CM | POA: Diagnosis present

## 2022-03-24 DIAGNOSIS — R270 Ataxia, unspecified: Secondary | ICD-10-CM | POA: Diagnosis not present

## 2022-03-24 DIAGNOSIS — Z86711 Personal history of pulmonary embolism: Secondary | ICD-10-CM | POA: Diagnosis not present

## 2022-03-24 DIAGNOSIS — Z7901 Long term (current) use of anticoagulants: Secondary | ICD-10-CM

## 2022-03-24 DIAGNOSIS — S0990XA Unspecified injury of head, initial encounter: Secondary | ICD-10-CM | POA: Diagnosis not present

## 2022-03-24 DIAGNOSIS — E871 Hypo-osmolality and hyponatremia: Secondary | ICD-10-CM | POA: Diagnosis present

## 2022-03-24 DIAGNOSIS — R531 Weakness: Secondary | ICD-10-CM | POA: Diagnosis not present

## 2022-03-24 DIAGNOSIS — Z800 Family history of malignant neoplasm of digestive organs: Secondary | ICD-10-CM

## 2022-03-24 DIAGNOSIS — Z825 Family history of asthma and other chronic lower respiratory diseases: Secondary | ICD-10-CM

## 2022-03-24 DIAGNOSIS — R079 Chest pain, unspecified: Secondary | ICD-10-CM | POA: Diagnosis not present

## 2022-03-24 HISTORY — PX: BUBBLE STUDY: SHX6837

## 2022-03-24 HISTORY — PX: TEE WITHOUT CARDIOVERSION: SHX5443

## 2022-03-24 HISTORY — PX: ESOPHAGOGASTRODUODENOSCOPY (EGD) WITH PROPOFOL: SHX5813

## 2022-03-24 HISTORY — PX: HOT HEMOSTASIS: SHX5433

## 2022-03-24 HISTORY — PX: HEMOSTASIS CONTROL: SHX6838

## 2022-03-24 LAB — COMPREHENSIVE METABOLIC PANEL
ALT: 22 U/L (ref 0–44)
AST: 31 U/L (ref 15–41)
Albumin: 3.1 g/dL — ABNORMAL LOW (ref 3.5–5.0)
Alkaline Phosphatase: 71 U/L (ref 38–126)
Anion gap: 9 (ref 5–15)
BUN: 28 mg/dL — ABNORMAL HIGH (ref 8–23)
CO2: 19 mmol/L — ABNORMAL LOW (ref 22–32)
Calcium: 8.1 mg/dL — ABNORMAL LOW (ref 8.9–10.3)
Chloride: 103 mmol/L (ref 98–111)
Creatinine, Ser: 1.91 mg/dL — ABNORMAL HIGH (ref 0.61–1.24)
GFR, Estimated: 36 mL/min — ABNORMAL LOW (ref >60)
Glucose, Bld: 122 mg/dL — ABNORMAL HIGH (ref 70–99)
Potassium: 4 mmol/L (ref 3.5–5.1)
Sodium: 131 mmol/L — ABNORMAL LOW (ref 135–145)
Total Bilirubin: 0.2 mg/dL — ABNORMAL LOW (ref 0.3–1.2)
Total Protein: 5.5 g/dL — ABNORMAL LOW (ref 6.5–8.1)

## 2022-03-24 LAB — CBG MONITORING, ED: Glucose-Capillary: 99 mg/dL (ref 70–99)

## 2022-03-24 LAB — CBC WITH DIFFERENTIAL/PLATELET
Abs Immature Granulocytes: 0.04 10*3/uL (ref 0.00–0.07)
Basophils Absolute: 0 10*3/uL (ref 0.0–0.1)
Basophils Relative: 1 %
Eosinophils Absolute: 0.1 10*3/uL (ref 0.0–0.5)
Eosinophils Relative: 1 %
HCT: 16.8 % — ABNORMAL LOW (ref 39.0–52.0)
Hemoglobin: 4.8 g/dL — CL (ref 13.0–17.0)
Immature Granulocytes: 1 %
Lymphocytes Relative: 14 %
Lymphs Abs: 0.8 10*3/uL (ref 0.7–4.0)
MCH: 26.1 pg (ref 26.0–34.0)
MCHC: 28.6 g/dL — ABNORMAL LOW (ref 30.0–36.0)
MCV: 91.3 fL (ref 80.0–100.0)
Monocytes Absolute: 0.6 10*3/uL (ref 0.1–1.0)
Monocytes Relative: 11 %
Neutro Abs: 4.4 10*3/uL (ref 1.7–7.7)
Neutrophils Relative %: 72 %
Platelets: 219 10*3/uL (ref 150–400)
RBC: 1.84 MIL/uL — ABNORMAL LOW (ref 4.22–5.81)
RDW: 16.9 % — ABNORMAL HIGH (ref 11.5–15.5)
WBC: 5.9 10*3/uL (ref 4.0–10.5)
nRBC: 0.3 % — ABNORMAL HIGH (ref 0.0–0.2)

## 2022-03-24 LAB — TROPONIN I (HIGH SENSITIVITY)
Troponin I (High Sensitivity): 5431 ng/L (ref <18)
Troponin I (High Sensitivity): 3894 ng/L (ref <18)

## 2022-03-24 LAB — GLUCOSE, CAPILLARY: Glucose-Capillary: 151 mg/dL — ABNORMAL HIGH (ref 70–99)

## 2022-03-24 LAB — HEMOGLOBIN AND HEMATOCRIT, BLOOD
HCT: 26.2 % — ABNORMAL LOW (ref 39.0–52.0)
Hemoglobin: 8.2 g/dL — ABNORMAL LOW (ref 13.0–17.0)

## 2022-03-24 LAB — CBC
HCT: 26.5 % — ABNORMAL LOW (ref 39.0–52.0)
Hemoglobin: 8.1 g/dL — ABNORMAL LOW (ref 13.0–17.0)
MCH: 26.7 pg (ref 26.0–34.0)
MCHC: 30.6 g/dL (ref 30.0–36.0)
MCV: 87.5 fL (ref 80.0–100.0)
Platelets: 226 10*3/uL (ref 150–400)
RBC: 3.03 MIL/uL — ABNORMAL LOW (ref 4.22–5.81)
RDW: 16.3 % — ABNORMAL HIGH (ref 11.5–15.5)
WBC: 6.7 10*3/uL (ref 4.0–10.5)
nRBC: 0.3 % — ABNORMAL HIGH (ref 0.0–0.2)

## 2022-03-24 LAB — MRSA NEXT GEN BY PCR, NASAL: MRSA by PCR Next Gen: NOT DETECTED

## 2022-03-24 LAB — BLOOD PRODUCT ORDER (VERBAL) VERIFICATION

## 2022-03-24 LAB — BRAIN NATRIURETIC PEPTIDE: B Natriuretic Peptide: 178.8 pg/mL — ABNORMAL HIGH (ref 0.0–100.0)

## 2022-03-24 LAB — PREPARE RBC (CROSSMATCH)

## 2022-03-24 LAB — POC OCCULT BLOOD, ED: Fecal Occult Bld: POSITIVE — AB

## 2022-03-24 SURGERY — ESOPHAGOGASTRODUODENOSCOPY (EGD) WITH PROPOFOL
Anesthesia: General

## 2022-03-24 MED ORDER — SODIUM CHLORIDE 0.9 % IV SOLN
10.0000 mL/h | Freq: Once | INTRAVENOUS | Status: AC
  Administered 2022-03-24: 10 mL/h via INTRAVENOUS

## 2022-03-24 MED ORDER — SODIUM CHLORIDE 0.9 % IV SOLN: INTRAVENOUS | Status: DC

## 2022-03-24 MED ORDER — PROPOFOL 10 MG/ML IV BOLUS
INTRAVENOUS | Status: DC | PRN
  Administered 2022-03-24: 100 mg via INTRAVENOUS

## 2022-03-24 MED ORDER — FENTANYL CITRATE (PF) 100 MCG/2ML IJ SOLN
INTRAMUSCULAR | Status: AC
  Filled 2022-03-24: qty 2

## 2022-03-24 MED ORDER — PANTOPRAZOLE SODIUM 40 MG IV SOLR
40.0000 mg | Freq: Two times a day (BID) | INTRAVENOUS | Status: DC
  Administered 2022-03-24 – 2022-03-25 (×3): 40 mg via INTRAVENOUS
  Filled 2022-03-24 (×3): qty 10

## 2022-03-24 MED ORDER — DOCUSATE SODIUM 100 MG PO CAPS: 100.0000 mg | ORAL_CAPSULE | Freq: Two times a day (BID) | ORAL | Status: DC | PRN

## 2022-03-24 MED ORDER — INSULIN ASPART 100 UNIT/ML IJ SOLN
1.0000 [IU] | INTRAMUSCULAR | Status: DC
  Administered 2022-03-24: 2 [IU] via SUBCUTANEOUS
  Administered 2022-03-25: 1 [IU] via SUBCUTANEOUS
  Administered 2022-03-25: 2 [IU] via SUBCUTANEOUS

## 2022-03-24 MED ORDER — ORAL CARE MOUTH RINSE
15.0000 mL | OROMUCOSAL | Status: DC
  Administered 2022-03-24 – 2022-03-25 (×7): 15 mL via OROMUCOSAL

## 2022-03-24 MED ORDER — POLYETHYLENE GLYCOL 3350 17 G PO PACK: 17.0000 g | PACK | Freq: Every day | ORAL | Status: DC | PRN

## 2022-03-24 MED ORDER — FENTANYL 2500MCG IN NS 250ML (10MCG/ML) PREMIX INFUSION
0.0000 ug/h | INTRAVENOUS | Status: DC
  Filled 2022-03-24: qty 250

## 2022-03-24 MED ORDER — ALBUTEROL SULFATE HFA 108 (90 BASE) MCG/ACT IN AERS
INHALATION_SPRAY | RESPIRATORY_TRACT | Status: DC | PRN
  Administered 2022-03-24: 2 via RESPIRATORY_TRACT

## 2022-03-24 MED ORDER — LIDOCAINE 2% (20 MG/ML) 5 ML SYRINGE
INTRAMUSCULAR | Status: DC | PRN
  Administered 2022-03-24: 60 mg via INTRAVENOUS

## 2022-03-24 MED ORDER — FENTANYL 2500MCG IN NS 250ML (10MCG/ML) PREMIX INFUSION
0.0000 ug/h | INTRAVENOUS | Status: DC
  Administered 2022-03-24: 25 ug/h via INTRAVENOUS

## 2022-03-24 MED ORDER — ONDANSETRON HCL 4 MG/2ML IJ SOLN
4.0000 mg | Freq: Four times a day (QID) | INTRAMUSCULAR | Status: DC | PRN
  Administered 2022-03-24: 4 mg via INTRAVENOUS
  Filled 2022-03-24: qty 2

## 2022-03-24 MED ORDER — DEXAMETHASONE SODIUM PHOSPHATE 10 MG/ML IJ SOLN
INTRAMUSCULAR | Status: DC | PRN
  Administered 2022-03-24: 10 mg via INTRAVENOUS

## 2022-03-24 MED ORDER — ORAL CARE MOUTH RINSE: 15.0000 mL | OROMUCOSAL | Status: DC | PRN

## 2022-03-24 MED ORDER — ONDANSETRON HCL 4 MG/2ML IJ SOLN
INTRAMUSCULAR | Status: DC | PRN
  Administered 2022-03-24: 4 mg via INTRAVENOUS

## 2022-03-24 MED ORDER — ASPIRIN 81 MG PO CHEW
324.0000 mg | CHEWABLE_TABLET | Freq: Once | ORAL | Status: AC
  Administered 2022-03-24: 324 mg via ORAL
  Filled 2022-03-24: qty 4

## 2022-03-24 MED ORDER — ROCURONIUM BROMIDE 10 MG/ML (PF) SYRINGE
PREFILLED_SYRINGE | INTRAVENOUS | Status: DC | PRN
  Administered 2022-03-24: 30 mg via INTRAVENOUS

## 2022-03-24 MED ORDER — SUCCINYLCHOLINE CHLORIDE 200 MG/10ML IV SOSY
PREFILLED_SYRINGE | INTRAVENOUS | Status: DC | PRN
  Administered 2022-03-24: 140 mg via INTRAVENOUS

## 2022-03-24 MED ORDER — PROPOFOL 1000 MG/100ML IV EMUL
5.0000 ug/kg/min | INTRAVENOUS | Status: DC
  Administered 2022-03-24: 60 ug/kg/min via INTRAVENOUS
  Administered 2022-03-24: 40 ug/kg/min via INTRAVENOUS
  Administered 2022-03-25 (×2): 30 ug/kg/min via INTRAVENOUS
  Filled 2022-03-24 (×4): qty 100

## 2022-03-24 MED ORDER — CHLORHEXIDINE GLUCONATE CLOTH 2 % EX PADS
6.0000 | MEDICATED_PAD | Freq: Every day | CUTANEOUS | Status: DC
  Administered 2022-03-24 – 2022-03-25 (×3): 6 via TOPICAL

## 2022-03-24 MED ORDER — EPINEPHRINE 1 MG/10ML IJ SOSY
PREFILLED_SYRINGE | INTRAMUSCULAR | Status: AC | PRN
  Administered 2022-03-24: 1 mg via INTRAVENOUS

## 2022-03-24 MED ORDER — SODIUM CHLORIDE 0.9 % IV SOLN: INTRAVENOUS | Status: DC | PRN

## 2022-03-24 MED ORDER — FUROSEMIDE 10 MG/ML IJ SOLN
80.0000 mg | Freq: Once | INTRAMUSCULAR | Status: AC
  Administered 2022-03-24: 80 mg via INTRAVENOUS
  Filled 2022-03-24: qty 8

## 2022-03-24 MED ORDER — FENTANYL CITRATE (PF) 250 MCG/5ML IJ SOLN
INTRAMUSCULAR | Status: DC | PRN
  Administered 2022-03-24: 100 ug via INTRAVENOUS

## 2022-03-24 SURGICAL SUPPLY — 15 items

## 2022-03-24 NOTE — Op Note (Signed)
[facility] Patient Name: Aaron [name] Procedure Date : 03/24/2022 MRN: [id number] Attending MD: Thornton Park MD, MD Date of Birth: [birth year] CSN: [id number] Age: 76 Admit Type: Inpatient Procedure:                Upper GI endoscopy Indications:              Iron deficiency anemia due to suspected upper                            gastrointestinal bleeding, Melena Providers:                Thornton Park MD, MD, Jaci Carrel, RN,                            Luan Moore, Technician, Brien Mates, RNFA Referring MD:              Medicines:                Monitored Anesthesia Care Complications:            No immediate complications. Estimated Blood Loss:     Estimated blood loss: none. Procedure:                Pre-Anesthesia Assessment:                           - Prior to the procedure, a History and Physical                            was performed, and patient medications and                            allergies were reviewed. The patient's tolerance of                            previous anesthesia was also reviewed. The risks                            and benefits of the procedure and the sedation                            options and risks were discussed with the patient.                            All questions were answered, and informed consent                            was obtained. Prior Anticoagulants: The patient has                            taken Xarelto (rivaroxaban), last dose was 1 day                            prior to procedure. ASA Grade Assessment: IV - A  patient with severe systemic disease that is a                            constant threat to life. After reviewing the risks                            and benefits, the patient was deemed in                            satisfactory condition to undergo the procedure.                           After obtaining informed consent, the endoscope was                             passed under direct vision. Throughout the                            procedure, the patient's blood pressure, pulse, and                            oxygen saturations were monitored continuously. The                            GIF-H190 ([id number] Olympus endoscope was introduced                            through the mouth, and advanced to the third part                            of duodenum. The upper GI endoscopy was                            accomplished without difficulty. The patient                            tolerated the procedure well. Scope In: Scope Out: Findings:      The esophagus was normal.      nt in the gastric antrum. Coagulation for hemostasis using argon plasma       was successful. However, he is at very high risk for rebleeding so for       additional hemostasis, hemostatic spray was deployed throughout the       antrum. Multiple sprays were applied. I was unable to fully assess the       antrum after the procedure.      The examined duodenum was normal. Impression:               - Normal esophagus.                           - Gastric antral vascular ectasia with bleeding.                            Treated with argon plasma coagulation (APC) with  success. Hemostatic spray then applied given the                            high risk for rebleeding and need for cardiac                            catheterization.                           - Normal examined duodenum. Recommendation:           - Return patient to ICU for ongoing care.                           - NPO.                           - Continue present medications.                           - IV PPI                           - Serial hgb/hct with transfusion as indicated.                           - He is at high risk for rebleeding from the [place].                            There is no definite role for drug therapies,                            although could  consider empiric octreotide if                            bleeding recurs. Otherwise, treatment would be                            supportive as needed.                           - As the GAVE is the likely cause of his chronic GI                            blood loss anemia, serial upper endoscopy as an                            outpatient for retreatment recommended when able to                            hold antiplatelet and anticoagulant therapy for the                            procedures.  The results were discussed with the patient's wife                            in the waiting room. I also discussed these                            findings with Dr. Debara Pickett. Procedure Code(s):        --- Professional ---                           [phone], Esophagogastroduodenoscopy, flexible,                            transoral; with control of bleeding, any method Diagnosis Code(s):        --- Professional ---                           R48.546, Angiodysplasia of stomach and duodenum                            with bleeding                           D50.9, Iron deficiency anemia, unspecified                           K92.1, Melena (includes Hematochezia) CPT copyright 2019 American Medical Association. All rights reserved. The codes documented in this report are preliminary and upon coder review may  be revised to meet current compliance requirements. Thornton Park MD, MD 03/24/2022 5:02:26 PM This report has been signed electronically. Number of Addenda: 0

## 2022-03-24 NOTE — Progress Notes (Signed)
  Echocardiogram Echocardiogram Transesophageal has been performed.  Aaron [name] 03/24/2022, 5:22 PM

## 2022-03-24 NOTE — Consult Note (Signed)
Cardiology Consult    Patient ID: Aaron [name] MRN: [id number], DOB/AGE: [birth year]   Admit date: 03/24/2022 Date of Consult: 03/24/2022 Requesting Provider: Merrily Pew, MD  PCP:  Aaron Lima, MD   [name] Providers Cardiologist:  Aaron Martinique, MD   {   Patient Profile    Aaron [name] is a 76 y.o. 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 He is being seen today (03/24/2022) due to ischemic ST changes on ECG and VF arrest in the ED.   History of Present Illness    He presented with a ground-level fall at home in the setting of gradually worsening dyspnea, dizziness, weakness, and pain in his legs for the last week or so. He fell on his right side and did hit his head on the floor. His BP has been slightly low since arrival and he was found to have a hemoglobin of 4.8. Initial ECG showed significant ST depression but no STEMI. He does endorse melena but no frank bleeding - he is scheduled for upcoming endoscopy, as above. He also endorses swelling in his legs for the past week or so, but denies any chest pain at any point. CT scans negative for significant injuries or ICH. While in the ED he had a VF arrest that was aborted with the initial defibrillation and is now conscious, still denies any chest pain. Pre-arrest labs also now notable for troponin of 5431 and BNP of 179.   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"   Heart murmur    Hyperlipidemia    Hypertension    Insomnia    Osteoarthritis    PONV (postoperative nausea and vomiting)    2006 - knee surgery   Pre-diabetes    Psoriasis    PTSD (post-traumatic stress disorder)    Pulmonary embolism ([place]) ~ 03/2006   bilaterally   Seasonal allergies    Shortness of breath 06/08/11   "w/exertion; that's why I'm here"   Sleep apnea, obstructive    Umbilical hernia     Past Surgical History:  Procedure Laterality Date   CARDIAC CATHETERIZATION     carpel tunnel release Bilateral ~ 06/2005/~ 08/2005   right/left   COLONOSCOPY W/ BIOPSIES AND POLYPECTOMY     CORONARY ANGIOPLASTY     CORONARY STENT PLACEMENT  02/13/2004   1   ENUCLEATION Right    age 67   EYE SURGERY     age 75; "ruptured  right pupil"   EYE SURGERY     age 38   [place]   placed artificial right eye   INGUINAL HERNIA REPAIR Right [place] ARTHROSCOPY  ~ 06/2005   right   MULTIPLE TOOTH EXTRACTIONS  spinal injections  2006-2007   "maybe 10 injections for herniated discs"   TONSILLECTOMY     "when I was real young"   TOTAL KNEE ARTHROPLASTY Left 10/09/2015   Procedure: TOTAL KNEE ARTHROPLASTY;  Surgeon: Dorna Leitz, MD;  Location: [facility];  Service: Orthopedics;  Laterality: Left;   TOTAL KNEE ARTHROPLASTY Right 04/04/2016   Procedure: TOTAL KNEE ARTHROPLASTY;  Surgeon: Dorna Leitz, MD;  Location: [facility];  Service: Orthopedics;  Laterality: Right;     Allergies  Allergen Reactions   Wellbutrin [Bupropion] Other (See Comments)    Hallucinations, sweating   Amoxicillin Palpitations   Inpatient Medications      Family History    Family History  Problem Relation Age of Onset   Asthma Father    Emphysema Father    Breast cancer Sister    Liver cancer Sister    Asthma Sister    Asthma Brother    He indicated that his mother is deceased. He indicated that his father is deceased. He indicated that one of his  four sisters is deceased. He indicated that only one of his three brothers is alive.   Social History    Social History   Socioeconomic History   Marital status: Married    Spouse name: Not on file   Number of children: 3   Years of education: Not on file   Highest education level: Not on file  Occupational History   Occupation: building maintenance    Comment: disabled  Tobacco Use   Smoking status: Former    Packs/day: 1.50    Years: 25.00    Total pack years: 37.50    Types: Cigarettes    Quit date: 07/10/1995    Years since quitting: 26.7   Smokeless tobacco: Never  Vaping Use   Vaping Use: Never used  Substance and Sexual Activity   Alcohol use: No   Drug use: No   Sexual activity: Not on file  Other Topics Concern   Not on file  Social History Narrative   Former Curator, 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   Recent stress, currently undergoing financial difficulties.   Social Determinants of Health   Financial Resource Strain: Low Risk  (10/29/2021)   Overall Financial Resource Strain (CARDIA)    Difficulty of Paying Living Expenses: Not hard at all  Food Insecurity: No Food Insecurity (10/29/2021)   Hunger Vital Sign    Worried About Running Out of Food in the Last Year: Never true    Ran Out of Food in the Last Year: Never true  Transportation Needs: No Transportation Needs (10/29/2021)   PRAPARE - Hydrologist (Medical): No    Lack of Transportation (Non-Medical): No  Physical Activity: Insufficiently Active (10/29/2021)   Exercise Vital Sign    Days of Exercise per Week: 3 days    Minutes of Exercise per Session: 30 min  Stress: No Stress Concern Present (10/29/2021)   [place]    Feeling of Stress : Not at all  Social Connections: Socially Isolated (10/29/2021)   Social Connection and Isolation Panel [NHANES]    Frequency of Communication with Friends  and Family: Three times a week    Frequency of Social Gatherings with Friends and Family: Three times a week    Attends Religious Services: Never    Active Member of Clubs or Organizations: No    Attends Archivist Meetings: Never  Marital Status: Widowed  Intimate Partner Violence: Not At Risk (10/29/2021)   Humiliation, Afraid, Rape, and Kick questionnaire    Fear of Current or Ex-Partner: No    Emotionally Abused: No    Physically Abused: No    Sexually Abused: No     Review of Systems    A comprehensive ROS was obtained with pertinent positives and negatives noted in the HPI.   Physical Exam    Blood pressure (!) 91/58, pulse 79, temperature 98.6 F (37 C), temperature source Oral, resp. rate 16, height '5\' 6"'$  (1.676 m), weight 114.3 kg, SpO2 100 %.    No intake or output data in the 24 hours ending 03/24/22 0547 Wt Readings from Last 3 Encounters:  03/24/22 114.3 kg  03/04/22 114.3 kg  02/14/22 111.9 kg    CONSTITUTIONAL: alert and conversant, obese, pale, asking for water, in no distress HEENT: abrasion on right forehead, right eye partially closed (states this is chronic NECK: difficult to assess JVP CARDIAC: Regular rhythm. Normal S1/S2, no S3/S4. Faint systolic murmur No friction rub.  VASCULAR: Radial pulses intact bilaterally. No carotid bruits. PULMONARY/CHEST WALL: no deformities, normal breath sounds bilaterally, normal work of breathing ABDOMINAL: soft, non-tender, non-distended EXTREMITIES: 3+ BLE edema, no muscle atrophy, warm and well-perfused SKIN: Pale. Dry and intact without apparent rashes or wounds. No peripheral cyanosis. NEUROLOGIC: alert, no abnormal movements, cranial nerves grossly intact.   Labs    Recent Labs    03/24/22 0415  TROPONINIHS 5,431*   Lab Results  Component Value Date   WBC 5.9 03/24/2022   HGB 4.8 (LL) 03/24/2022   HCT 16.8 (L) 03/24/2022   MCV 91.3 03/24/2022   PLT 219 03/24/2022    Recent Labs  Lab  03/24/22 0415  NA 131*  K 4.0  CL 103  CO2 19*  BUN 28*  CREATININE 1.91*  CALCIUM 8.1*  PROT 5.5*  BILITOT 0.2*  ALKPHOS 71  ALT 22  AST 31  GLUCOSE 122*   Lab Results  Component Value Date   CHOL 126 08/10/2021   HDL 37.70 (L) 08/10/2021   LDLCALC 89 12/17/2020   TRIG 214.0 (H) 08/10/2021   Lab Results  Component Value Date   DDIMER 0.48 10/12/2020   Recent Labs    03/24/22 0415  BNP 178.8*   No results for input(s): "PROBNP" in the last 8760 hours.    Radiology Studies    CT Head Wo Contrast  Result Date: 03/24/2022 CLINICAL DATA:  Head trauma with ataxia EXAM: CT HEAD WITHOUT CONTRAST CT CERVICAL SPINE WITHOUT CONTRAST TECHNIQUE: Multidetector CT imaging of the head and cervical spine was performed following the standard protocol without intravenous contrast. Multiplanar CT image reconstructions of the cervical spine were also generated. RADIATION DOSE REDUCTION: This exam was performed according to the departmental dose-optimization program which includes automated exposure control, adjustment of the mA and/or kV according to patient size and/or use of iterative reconstruction technique. COMPARISON:  None Available. FINDINGS: CT HEAD FINDINGS Brain: No evidence of acute infarction, hemorrhage, hydrocephalus, extra-axial collection or mass lesion/mass effect. Generalized cerebral volume loss and patchy chronic small vessel ischemia in the hemispheric white matter. Vascular: Atheromatous calcification Skull: No acute fracture Sinuses/Orbits: No evidence of injury. Right enucleation with prosthesis. Left cataract resection. CT CERVICAL SPINE FINDINGS Alignment: Normal Skull base and vertebrae: Chronic marginated lucency through the right C1 lateral mass at the atlantooccipital joint. No acute fracture or aggressive bone lesion. Soft tissues and spinal canal: No prevertebral fluid or  swelling. No visible canal hematoma. Disc levels: Multilevel spondylitic spurring. Milder  facet spurring with C2-3 ankylosis. Upper chest: No visible injury IMPRESSION: No evidence of acute intracranial or cervical spine injury. Electronically Signed   By: Jorje Guild M.D.   On: 03/24/2022 04:49   CT Cervical Spine Wo Contrast  Result Date: 03/24/2022 CLINICAL DATA:  Head trauma with ataxia EXAM: CT HEAD WITHOUT CONTRAST CT CERVICAL SPINE WITHOUT CONTRAST TECHNIQUE: Multidetector CT imaging of the head and cervical spine was performed following the standard protocol without intravenous contrast. Multiplanar CT image reconstructions of the cervical spine were also generated. RADIATION DOSE REDUCTION: This exam was performed according to the departmental dose-optimization program which includes automated exposure control, adjustment of the mA and/or kV according to patient size and/or use of iterative reconstruction technique. COMPARISON:  None Available. FINDINGS: CT HEAD FINDINGS Brain: No evidence of acute infarction, hemorrhage, hydrocephalus, extra-axial collection or mass lesion/mass effect. Generalized cerebral volume loss and patchy chronic small vessel ischemia in the hemispheric white matter. Vascular: Atheromatous calcification Skull: No acute fracture Sinuses/Orbits: No evidence of injury. Right enucleation with prosthesis. Left cataract resection. CT CERVICAL SPINE FINDINGS Alignment: Normal Skull base and vertebrae: Chronic marginated lucency through the right C1 lateral mass at the atlantooccipital joint. No acute fracture or aggressive bone lesion. Soft tissues and spinal canal: No prevertebral fluid or swelling. No visible canal hematoma. Disc levels: Multilevel spondylitic spurring. Milder facet spurring with C2-3 ankylosis. Upper chest: No visible injury IMPRESSION: No evidence of acute intracranial or cervical spine injury. Electronically Signed   By: Jorje Guild M.D.   On: 03/24/2022 04:49   DG Elbow 2 Views Right  Result Date: 03/24/2022 CLINICAL DATA:  Level 2 trauma,  fall on blood thinners EXAM: RIGHT ELBOW - 2 VIEW COMPARISON:  None Available. FINDINGS: There is no evidence of fracture, dislocation, or joint effusion. There is no evidence of arthropathy or other focal bone abnormality. Soft tissues are unremarkable. IMPRESSION: Negative. Electronically Signed   By: Jorje Guild M.D.   On: 03/24/2022 04:44   DG Chest Portable 1 View  Result Date: 03/24/2022 CLINICAL DATA:  Level 2 fall on blood thinners EXAM: PORTABLE CHEST 1 VIEW COMPARISON:  10/12/2020 FINDINGS: Hyperinflation. There is no edema, consolidation, effusion, or pneumothorax. Normal heart size when accounting for mediastinal fat. Stable mediastinal contours. Artifact from EKG leads. IMPRESSION: 1. No acute finding. 2. Hyperinflation. Electronically Signed   By: Jorje Guild M.D.   On: 03/24/2022 04:44    ECG & Cardiac Imaging   ECG: sinus rhythm with marked ST depression - personally reviewed.  Assessment & Plan    Chronic CAD with likely type II MI in the setting of severe anemia and suspected obstructive CAD. His ECG changes are now improving.  Acute decompensated heart failure Severe iron deficiency anemia with melena Recurrent VTE requiring chronic anticoagulation with Xarelto  He remains chest pain free. I do think he is ischemic, but not currently having chest pain and likely due to probably stable but obstructive CAD with concomitant severe anemia and decompensated heart failure - potentially due to combination of chronic ischemia and high-output from severe anemia.   Discussed immediate plan with Dr. Dayna Barker (EM) - will treat anemia first with transfusion and urgent GI evaluation for potential endoscopy at some point this morning. Once it is safe to proceed, he will likely need cardiac cath and diuresis. Will ensure he has had a full dose aspirin but not heparinize at this point. Further recommendations  to come.   Signed, Marykay Lex, MD 03/24/2022, 5:47 AM  For questions or  updates, please contact   Please consult [email or web address] for contact info under Cardiology/STEMI.

## 2022-03-24 NOTE — H&P (View-Only) (Signed)
    Briefly seen today at the bedside in the emergency department.  Please refer to the full cardiology consult note this morning from Dr. Kalman Shan.  [name] continues to complain of chest pain, more right-sided which I suspect from is from CPR as well as abdominal pain.  He is currently being transfused.  He appears pale but is sitting upright and is awake and oriented and conversant.  I discussed the situation further with him and his wife at the bedside.  He does have a history of PCI which was remote of the LAD.  It is possible that he has obstructive coronary disease although could have had his VF arrest given the absolute anemia.  Plan for GI evaluation once he is adequately transfused.  Obviously he would be at higher risk to proceed with that however I would not recommend a catheterization or revascularization until we are certain as to the cause of his bleeding.    We will continue to follow closely.  Pixie Casino, MD, [facility], [place] Director of the Advanced Lipid Disorders &  Cardiovascular Risk Reduction Clinic Diplomate of the American Board of Clinical Lipidology Attending Cardiologist  Direct Dial: [phone]  Fax: [phone]  Website:  [email or web address]

## 2022-03-24 NOTE — Code Documentation (Signed)
VFib

## 2022-03-24 NOTE — ED Triage Notes (Signed)
Pt BIB [facility] from home for fall to carpeted floor from a standing position; pt reports right arm pain and hitting his head; denies LOC; pt reports feeling dizzy prior to fall, further endorses SOB, dyspnea on exertion, and dizziness intermittently  x 1 week; pt given 324asa en route

## 2022-03-24 NOTE — Code Documentation (Signed)
120j shock delivered

## 2022-03-24 NOTE — ED Provider Notes (Signed)
[facility] EMERGENCY DEPARTMENT Provider Note   CSN: [id number] Arrival date & time: 03/24/22  0356     History  Chief Complaint  Patient presents with   Lytle Michaels    Aaron [name] is a 76 y.o. male.  1 week of progressively worsening DOE and dizziness. Wednesday evening it caused him to fall around 2100 striking his head on the ground and some type of heavy statue hit him as well. Was able to get up and went to bed. Tonight around 0330 couldn't get up and move around because of dizziness and dyspnea so called EMS. Initially with soft pressure and ST depressions on ECG. Brought here for further eval.    Fall       Home Medications Prior to Admission medications   Medication Sig Start Date End Date Taking? Authorizing Provider  amLODipine (NORVASC) 5 MG tablet TAKE 1 TABLET BY MOUTH EVERYDAY AT BEDTIME 02/02/22   [name], MD  atorvastatin (LIPITOR) 80 MG tablet TAKE 1 TABLET BY MOUTH ONCE  DAILY 02/10/22   [name], MD  Carboxymethylcellulose Sodium 1 % GEL Place 1 application into both eyes at bedtime as needed (Dry eye).    [provider]  clonazePAM (KLONOPIN) 0.5 MG tablet TAKE 1 TABLET BY MOUTH AT BEDTIME. 03/09/22   [name], MD  Daridorexant HCl (QUVIVIQ) 50 MG TABS Take 1 tablet by mouth at bedtime. 02/11/22   [name], MD  ezetimibe (ZETIA) 10 MG tablet TAKE 1 TABLET BY MOUTH  DAILY 12/20/21   Martinique, Peter M, MD  ferrous sulfate 325 (65 FE) MG EC tablet TAKE 1 TABLET BY MOUTH DAILY WITH BREAKFAST. SCHEDULE AN APPOINTMENT FOR FURTHER REFILLS 01/03/22   [place], Peter M, MD  pantoprazole (PROTONIX) 40 MG tablet Take 1 tablet (40 mg total) by mouth daily. 02/10/22   [name], MD  polyethylene glycol (MIRALAX / GLYCOLAX) 17 g packet Take 17 g by mouth daily. With coffee    [provider]  PREDNISONE PO Take by mouth.    [provider]  telmisartan-hydrochlorothiazide (MICARDIS HCT) 80-25 MG tablet Take 1  tablet by mouth daily. 08/16/21   [name], MD  tiZANidine (ZANAFLEX) 4 MG tablet Take 4 mg by mouth every 8 (eight) hours as needed. 01/25/22   [provider]  traMADol (ULTRAM) 50 MG tablet TAKE 2 TABLETS (100 MG TOTAL) BY MOUTH EVERY 6 (SIX) HOURS AS NEEDED FOR MODERATE PAIN - SEVERE PAIN 03/09/22   [name], MD  traZODone (DESYREL) 50 MG tablet TAKE 1 TABLET BY MOUTH EVERYDAY AT BEDTIME 03/09/22   [name], MD  venlafaxine XR (EFFEXOR-XR) 150 MG 24 hr capsule TAKE 1 CAPSULE BY MOUTH DAILY  WITH BREAKFAST 12/20/21   [name], MD  XARELTO 20 MG TABS tablet TAKE 1 TABLET BY MOUTH DAILY  WITH SUPPER 03/06/22   [name], MD      Allergies    Wellbutrin [bupropion] and Amoxicillin    Review of Systems   Review of Systems  Physical Exam Updated Vital Signs BP 91/63   Pulse 77   Temp 98.3 F (36.8 C) (Oral)   Resp 20   Ht '5\' 6"'$  (1.676 m)   Wt 114.3 kg   SpO2 100%   BMI 40.67 kg/m  Physical Exam Vitals and nursing note reviewed.  Constitutional:      Appearance: He is well-developed.  HENT:     Head:  Normocephalic.     Comments: Two small abrasions to posterior scalp and right parietal scalp Eyes:     Pupils: Pupils are equal, round, and reactive to light.  Cardiovascular:     Rate and Rhythm: Normal rate.  Pulmonary:     Effort: Pulmonary effort is normal. No respiratory distress.  Abdominal:     General: Abdomen is flat. There is distension.     Tenderness: There is no abdominal tenderness.  Musculoskeletal:        General: Tenderness (around the right elbow, no obvious deformity or swelling.) present. Normal range of motion.     Cervical back: Normal range of motion.     Right lower leg: Edema present.     Left lower leg: Edema present.  Skin:    General: Skin is warm and dry.     Coloration: Skin is not jaundiced or pale.  Neurological:     General: No focal deficit present.     Mental Status: He is alert.     ED Results /  Procedures / Treatments   Labs (all labs ordered are listed, but only abnormal results are displayed) Labs Reviewed  CBC WITH DIFFERENTIAL/PLATELET - Abnormal; Notable for the following components:      Result Value   RBC 1.84 (*)    Hemoglobin 4.8 (*)    HCT 16.8 (*)    MCHC 28.6 (*)    RDW 16.9 (*)    nRBC 0.3 (*)    All other components within normal limits  COMPREHENSIVE METABOLIC PANEL - Abnormal; Notable for the following components:   Sodium 131 (*)    CO2 19 (*)    Glucose, Bld 122 (*)    BUN 28 (*)    Creatinine, Ser 1.91 (*)    Calcium 8.1 (*)    Total Protein 5.5 (*)    Albumin 3.1 (*)    Total Bilirubin 0.2 (*)    GFR, Estimated 36 (*)    All other components within normal limits  BRAIN NATRIURETIC PEPTIDE - Abnormal; Notable for the following components:   B Natriuretic Peptide 178.8 (*)    All other components within normal limits  POC OCCULT BLOOD, ED - Abnormal; Notable for the following components:   Fecal Occult Bld POSITIVE (*)    All other components within normal limits  TROPONIN I (HIGH SENSITIVITY) - Abnormal; Notable for the following components:   Troponin I (High Sensitivity) 5,431 (*)    All other components within normal limits  CBG MONITORING, ED  TYPE AND SCREEN  PREPARE RBC (CROSSMATCH)  TROPONIN I (HIGH SENSITIVITY)    EKG EKG Interpretation  Date/Time:  Thursday March 24 2022 04:16:03 EDT Ventricular Rate:  71 PR Interval:  169 QRS Duration: 100 QT Interval:  439 QTC Calculation: 478 R Axis:   85 Text Interpretation: Sinus rhythm Borderline right axis deviation Repol abnrm, severe global ischemia (LM/MVD) Confirmed by Merrily Pew [phone]) on 03/24/2022 5:10:46 AM   EKG Interpretation  Date/Time:  Thursday March 24 2022 05:07:39 EDT Ventricular Rate:  108 PR Interval:  190 QRS Duration: 106 QT Interval:  346 QTC Calculation: 464 R Axis:   88 Text Interpretation: Sinus or ectopic atrial tachycardia Atrial premature  complexes Borderline right axis deviation Repol abnrm, severe global ischemia (LM/MVD) significantly worsened compared to 0416 same day Confirmed by Merrily Pew [phone]) on 03/24/2022 5:12:29 AM        EKG Interpretation  Date/Time:  Thursday March 24 2022 05:27:19 EDT Ventricular  Rate:  82 PR Interval:  208 QRS Duration: 101 QT Interval:  447 QTC Calculation: 523 R Axis:   80 Text Interpretation: Sinus rhythm Repol abnrm, severe global ischemia (LM/MVD) Prolonged QT interval Confirmed by Merrily Pew [phone]) on 03/24/2022 6:32:53 AM          Radiology CT Head Wo Contrast  Result Date: 03/24/2022 CLINICAL DATA:  Head trauma with ataxia EXAM: CT HEAD WITHOUT CONTRAST CT CERVICAL SPINE WITHOUT CONTRAST TECHNIQUE: Multidetector CT imaging of the head and cervical spine was performed following the standard protocol without intravenous contrast. Multiplanar CT image reconstructions of the cervical spine were also generated. RADIATION DOSE REDUCTION: This exam was performed according to the departmental dose-optimization program which includes automated exposure control, adjustment of the mA and/or kV according to patient size and/or use of iterative reconstruction technique. COMPARISON:  None Available. FINDINGS: CT HEAD FINDINGS Brain: No evidence of acute infarction, hemorrhage, hydrocephalus, extra-axial collection or mass lesion/mass effect. Generalized cerebral volume loss and patchy chronic small vessel ischemia in the hemispheric white matter. Vascular: Atheromatous calcification Skull: No acute fracture Sinuses/Orbits: No evidence of injury. Right enucleation with prosthesis. Left cataract resection. CT CERVICAL SPINE FINDINGS Alignment: Normal Skull base and vertebrae: Chronic marginated lucency through the right C1 lateral mass at the atlantooccipital joint. No acute fracture or aggressive bone lesion. Soft tissues and spinal canal: No prevertebral fluid or swelling. No visible  canal hematoma. Disc levels: Multilevel spondylitic spurring. Milder facet spurring with C2-3 ankylosis. Upper chest: No visible injury IMPRESSION: No evidence of acute intracranial or cervical spine injury. Electronically Signed   By: Jorje Guild M.D.   On: 03/24/2022 04:49   CT Cervical Spine Wo Contrast  Result Date: 03/24/2022 CLINICAL DATA:  Head trauma with ataxia EXAM: CT HEAD WITHOUT CONTRAST CT CERVICAL SPINE WITHOUT CONTRAST TECHNIQUE: Multidetector CT imaging of the head and cervical spine was performed following the standard protocol without intravenous contrast. Multiplanar CT image reconstructions of the cervical spine were also generated. RADIATION DOSE REDUCTION: This exam was performed according to the departmental dose-optimization program which includes automated exposure control, adjustment of the mA and/or kV according to patient size and/or use of iterative reconstruction technique. COMPARISON:  None Available. FINDINGS: CT HEAD FINDINGS Brain: No evidence of acute infarction, hemorrhage, hydrocephalus, extra-axial collection or mass lesion/mass effect. Generalized cerebral volume loss and patchy chronic small vessel ischemia in the hemispheric white matter. Vascular: Atheromatous calcification Skull: No acute fracture Sinuses/Orbits: No evidence of injury. Right enucleation with prosthesis. Left cataract resection. CT CERVICAL SPINE FINDINGS Alignment: Normal Skull base and vertebrae: Chronic marginated lucency through the right C1 lateral mass at the atlantooccipital joint. No acute fracture or aggressive bone lesion. Soft tissues and spinal canal: No prevertebral fluid or swelling. No visible canal hematoma. Disc levels: Multilevel spondylitic spurring. Milder facet spurring with C2-3 ankylosis. Upper chest: No visible injury IMPRESSION: No evidence of acute intracranial or cervical spine injury. Electronically Signed   By: Jorje Guild M.D.   On: 03/24/2022 04:49   DG Elbow 2  Views Right  Result Date: 03/24/2022 CLINICAL DATA:  Level 2 trauma, fall on blood thinners EXAM: RIGHT ELBOW - 2 VIEW COMPARISON:  None Available. FINDINGS: There is no evidence of fracture, dislocation, or joint effusion. There is no evidence of arthropathy or other focal bone abnormality. Soft tissues are unremarkable. IMPRESSION: Negative. Electronically Signed   By: Jorje Guild M.D.   On: 03/24/2022 04:44   DG Chest Portable 1 View  Result Date:  03/24/2022 CLINICAL DATA:  Level 2 fall on blood thinners EXAM: PORTABLE CHEST 1 VIEW COMPARISON:  10/12/2020 FINDINGS: Hyperinflation. There is no edema, consolidation, effusion, or pneumothorax. Normal heart size when accounting for mediastinal fat. Stable mediastinal contours. Artifact from EKG leads. IMPRESSION: 1. No acute finding. 2. Hyperinflation. Electronically Signed   By: Jorje Guild M.D.   On: 03/24/2022 04:44    Procedures .Critical Care  Performed by: Merrily Pew, MD Authorized by: Merrily Pew, MD   Critical care provider statement:    Critical care time (minutes):  30   Critical care was necessary to treat or prevent imminent or life-threatening deterioration of the following conditions:  Circulatory failure and cardiac failure   Critical care was time spent personally by me on the following activities:  Development of treatment plan with patient or surrogate, discussions with consultants, evaluation of patient's response to treatment, examination of patient, ordering and review of laboratory studies, ordering and review of radiographic studies, ordering and performing treatments and interventions, pulse oximetry, re-evaluation of patient's condition and review of old charts CPR  Date/Time: 03/24/2022 6:21 AM  Performed by: Merrily Pew, MD Authorized by: Merrily Pew, MD  CPR Procedure Details:      Amount of time prior to administration of ACLS/BLS (minutes):  0   ACLS/BLS initiated by EMS: No     CPR/ACLS performed  in the ED: Yes     Duration of CPR (minutes):  4   Outcome: ROSC obtained    CPR performed via ACLS guidelines under my direct supervision.  See RN documentation for details including defibrillator use, medications, doses and timing. .Cardioversion  Date/Time: 03/24/2022 6:22 AM  Performed by: Merrily Pew, MD Authorized by: Merrily Pew, MD   Consent:    Consent obtained:  Emergent situation Pre-procedure details:    Cardioversion basis:  Emergent   Pre-procedure rhythm: Ventricular fibrillation.   Electrode placement:  Anterior-posterior Patient sedated: No Attempt one:    Cardioversion mode:  Asynchronous   Shock (Joules):  150   Cardioversion outcome attempt one: converstion to sinus tachycardia. Post-procedure details:    Patient status:  Awake   Patient tolerance of procedure:  Tolerated well, no immediate complications Comments:     Unresponsive in Vfib, cardioverted during CPR w/ epi and converted to sinus tachy and became responsive. No obvious immediate complications.       Medications Ordered in ED Medications  0.9 %  sodium chloride infusion (0 mL/hr Intravenous Stopped 03/24/22 0531)  EPINEPHrine (ADRENALIN) 1 MG/10ML injection (1 mg Intravenous Given 03/24/22 0455)  aspirin chewable tablet 324 mg (324 mg Oral Given 03/24/22 0544)    ED Course/ Medical Decision Making/ A&P                           Medical Decision Making Amount and/or Complexity of Data Reviewed Labs: ordered. Radiology: ordered. ECG/medicine tests: ordered.  Risk OTC drugs. Prescription drug management.  76 yo M here with dizziness/DOE. Also a fall on xarelto so made a level II trauma prior to arrival. Ct head/neck 2/2 head injury on anticoagulation but more improtantly will get cardiopulmonary w/u 2/2 other symptoms.  His hemoglobin came back at 4.8, type/screen and 3 units ordered. Hemoccult positive but not melanotic or hematochezia. GI consulted per institutional protocol. Will need  admitted.   While talking to another patient I was asked to come back to the patient room. On arrival he was unresponsive, getting CPR and in  vfib. I performed chest compressions while a second line was obtained. Defibrillated shortly after. Next pulse check and patient was spontaneously breathing in a sinus tachycardia and quickly became responsive. Repeat ecg was sinus tachycardia with worsened global ischemia appearance. Another taken a few minutes later showed persistent changes c/w ischemia, discussed with cardiology and at that time his troponin returned at greater than 5000 (this was taken before his V-fib arrest).  As the patient is very medically complicated at this point with the ongoing GI bleed, anemia and likely needs a colonoscopy look for any high risk lesions cardiology was hesitant to take him to the Cath Lab at that time.  Suggested a full dose aspirin but will hold on heparin now secondary to the ongoing bleeding.  Thought this was unlikely related to demand ischemia by itself probably does have a culprit lesion.  Reached out to Dr. Bryan Lemma to try to get a colonoscopy scheduled early in the morning with a likely cath later in the day with cardiology.  Also discussed with critical care for admission.  Patient receiving blood with soft pressures but not overtly hypotensive or tachycardic.   CT head done and showed no obvious traumatic injuries (independently viewed and interpreted by myself and radiology read reviewed). CT C spine done and showed no obvious traumatic injuries (independently viewed and interpreted by myself and radiology read reviewed). CXR done and showed no obvious traumatic injuries, pulmonary edema or pneumonia (independently viewed and interpreted by myself and radiology read reviewed).  Final Clinical Impression(s) / ED Diagnoses Final diagnoses:  Symptomatic anemia  Ventricular fibrillation [facility])    Rx / DC Orders ED Discharge Orders     None          Avielle Imbert, Corene Cornea, MD 03/24/22 [phone]

## 2022-03-24 NOTE — Progress Notes (Signed)
    Briefly seen today at the bedside in the emergency department.  Please refer to the full cardiology consult note this morning from Dr. Kalman Shan.  Aaron [name] continues to complain of chest pain, more right-sided which I suspect from is from CPR as well as abdominal pain.  He is currently being transfused.  He appears pale but is sitting upright and is awake and oriented and conversant.  I discussed the situation further with him and his wife at the bedside.  He does have a history of PCI which was remote of the LAD.  It is possible that he has obstructive coronary disease although could have had his VF arrest given the absolute anemia.  Plan for GI evaluation once he is adequately transfused.  Obviously he would be at higher risk to proceed with that however I would not recommend a catheterization or revascularization until we are certain as to the cause of his bleeding.    We will continue to follow closely.  Pixie Casino, MD, [facility] [place] Director of the [facility]  [facility] Diplomate of the American Board of Clinical Lipidology Attending Cardiologist  Direct Dial: [phone]  Fax: [phone]  Website:  [email or web address]

## 2022-03-24 NOTE — Anesthesia Procedure Notes (Signed)
Procedure Name: Intubation Date/Time: 03/24/2022 3:50 PM  Performed by: Griffin Dakin, CRNAPre-anesthesia Checklist: Patient identified, Emergency Drugs available, Suction available and Patient being monitored Patient Re-evaluated:Patient Re-evaluated prior to induction Oxygen Delivery Method: Circle system utilized Preoxygenation: Pre-oxygenation with 100% oxygen Induction Type: IV induction Laryngoscope Size: Mac and 4 Grade View: Grade I Tube type: Oral Tube size: 7.5 mm Number of attempts: 1 Airway Equipment and Method: Stylet Placement Confirmation: ETT inserted through vocal cords under direct vision, positive ETCO2 and breath sounds checked- equal and bilateral Secured at: 23 cm Tube secured with: Tape Dental Injury: Teeth and Oropharynx as per pre-operative assessment

## 2022-03-24 NOTE — H&P (Signed)
NAME:  Aaron [name], MRN:  [id number], DOB:  06/13/1946, LOS: 0 ADMISSION DATE:  03/24/2022, CONSULTATION DATE:  03/24/22 REFERRING MD:  EDP, CHIEF COMPLAINT:  weakness, fall   History of Present Illness:  Aaron [name] is a 76 y.o. M with PMH significant for CAD, DVT and PE on Xarelto, recent iron deficiency anemia for which he follows with Dr. Earlie Server and receives blood transfusions who presented after dizziness and fall from standing.  His wife states he has felt intermittently weak and dizzy for the last two months which they ascribed to anemia.   He also has been under a great deal of stress trying to de-clutter his house in order not to have his home-owner's insurance not be cancelled.   In the ED his Hgb was 4.8 and stool hemoccult positive.   EKG with ischemic changes and borderline low BP.    While in the ED, pt had a brief Vfib arrest.  Not intubated, awake and alert afterwards. Troponin 5431.  Trauma work-up negative.  GI and cardiology were consulted and PCCM asked to admit. Denies NSAID use, has chronic GERD sx's, no colonoscopy since 2012  Pertinent  Medical History   has a past medical history of Anemia, Anxiety, Blood transfusion, Cancer ([place]), Constipation due to pain medication, Coronary artery disease, Depression, Full dentures, GERD (gastroesophageal reflux disease), Headache(784.0), Heart murmur, Hyperlipidemia, Hypertension, Insomnia, Osteoarthritis, PONV (postoperative nausea and vomiting), Pre-diabetes, Psoriasis, PTSD (post-traumatic stress disorder), Pulmonary embolism ([place]) (~ 03/2006), Seasonal allergies, Shortness of breath (06/08/11), Sleep apnea, obstructive, and Umbilical hernia.   Significant Hospital Events: Including procedures, antibiotic start and stop dates in addition to other pertinent events   9/31 presented with dizziness and fall, concern for GIB and ACS  Interim History / Subjective:  Pt awake and alert, borderline low BP  Objective   Blood pressure  (!) 105/56, pulse 73, temperature 98.1 F (36.7 C), temperature source Oral, resp. rate 17, height '5\' 6"'$  (1.676 m), weight 114.3 kg, SpO2 100 %.       No intake or output data in the 24 hours ending 03/24/22 0643 Filed Weights   03/24/22 0407  Weight: 114.3 kg   General:  pale, ill-appearing M awake and in no distress HEENT: MM pale/moist, sclera anicteric Neuro: awake, alert, oriented and following commands CV: s1s2 rrr, no m/r/g PULM:  clear bilaterally without rhonchi or wheezing on [place] GI: soft, non-tender Extremities: warm/dry, 1+ edema  Skin: no rashes or lesions   Resolved Hospital Problem list     Assessment & Plan:   Acute on chronic Anemia, likely secondary to iron deficiency and slow GIB Initial Hgb 4.8, borderline low BP Stool hemoccult + -3 units PRBC's ordered -GI consulted, likely EGD today -admit to ICU -trend Hgb -PPI  Vfib arrest and concern for ACS  Cardiology consulted  -concern for type II MI in the setting of demand -holding heparin -chest pain free, once bleeding issues stable will need cardiac cath  AKI -Creatinine 1.9, baseline normal  -likely 2/2 pre-renal volume depletion  -monitor BMP and urine output -avoid nephrotoxins   Best Practice (right click and "Reselect all SmartList Selections" daily)   Diet/type: NPO DVT prophylaxis: SCD GI prophylaxis: PPI Lines: N/A Foley:  N/A Code Status:  full code Last date of multidisciplinary goals of care discussion [wife and patient updated, confirmed full code]  Labs   CBC: Recent Labs  Lab 03/24/22 0415  WBC 5.9  NEUTROABS 4.4  HGB 4.8*  HCT 16.8*  MCV 91.3  PLT 824    Basic Metabolic Panel: Recent Labs  Lab 03/24/22 0415  NA 131*  K 4.0  CL 103  CO2 19*  GLUCOSE 122*  BUN 28*  CREATININE 1.91*  CALCIUM 8.1*   GFR: Estimated Creatinine Clearance: 39.1 mL/min (A) (by C-G formula based on SCr of 1.91 mg/dL (H)). Recent Labs  Lab 03/24/22 0415  WBC 5.9    Liver  Function Tests: Recent Labs  Lab 03/24/22 0415  AST 31  ALT 22  ALKPHOS 71  BILITOT 0.2*  PROT 5.5*  ALBUMIN 3.1*   No results for input(s): "LIPASE", "AMYLASE" in the last 168 hours. No results for input(s): "AMMONIA" in the last 168 hours.  ABG No results found for: "PHART", "PCO2ART", "PO2ART", "HCO3", "TCO2", "ACIDBASEDEF", "O2SAT"   Coagulation Profile: No results for input(s): "INR", "PROTIME" in the last 168 hours.  Cardiac Enzymes: No results for input(s): "CKTOTAL", "CKMB", "CKMBINDEX", "TROPONINI" in the last 168 hours.  HbA1C: Hemoglobin A1C  Date/Time Value Ref Range Status  12/17/2020 04:27 PM 5.9 (A) 4.0 - 5.6 % Final   Hgb A1c MFr Bld  Date/Time Value Ref Range Status  08/10/2021 03:20 PM 5.8 4.6 - 6.5 % Final    Comment:    Glycemic Control Guidelines for People with Diabetes:Non Diabetic:  <6%Goal of Therapy: <7%Additional Action Suggested:  >8%   02/10/2020 03:32 PM 6.4 (H) <5.7 % of total Hgb Final    Comment:    For someone without known diabetes, a hemoglobin  A1c value between 5.7% and 6.4% is consistent with prediabetes and should be confirmed with a  follow-up test. . For someone with known diabetes, a value <7% indicates that their diabetes is well controlled. A1c targets should be individualized based on duration of diabetes, age, comorbid conditions, and other considerations. . This assay result is consistent with an increased risk of diabetes. . Currently, no consensus exists regarding use of hemoglobin A1c for diagnosis of diabetes for children. .     CBG: Recent Labs  Lab 03/24/22 0432  GLUCAP 99    Review of Systems:   Please see the history of present illness. All other systems reviewed and are negative    Past Medical History:  He,  has a past medical history of Anemia, Anxiety, Blood transfusion, Cancer ([place]), Constipation due to pain medication, Coronary artery disease, Depression, Full dentures, GERD  (gastroesophageal reflux disease), Headache(784.0), Heart murmur, Hyperlipidemia, Hypertension, Insomnia, Osteoarthritis, PONV (postoperative nausea and vomiting), Pre-diabetes, Psoriasis, PTSD (post-traumatic stress disorder), Pulmonary embolism ([place]) (~ 03/2006), Seasonal allergies, Shortness of breath (06/08/11), Sleep apnea, obstructive, and Umbilical hernia.   Surgical History:   Past Surgical History:  Procedure Laterality Date   CARDIAC CATHETERIZATION     carpel tunnel release Bilateral ~ 06/2005/~ 08/2005   right/left   COLONOSCOPY W/ BIOPSIES AND POLYPECTOMY     CORONARY ANGIOPLASTY     CORONARY STENT PLACEMENT  02/13/2004   1   ENUCLEATION Right    age 80   EYE SURGERY     age 33; "ruptured  right pupil"   EYE SURGERY     age 89   [place]   placed artificial right eye   INGUINAL HERNIA REPAIR Right 1983   INGUINAL HERNIA REPAIR Left 1985   KNEE ARTHROSCOPY  ~ 06/2005   right   MULTIPLE TOOTH EXTRACTIONS     spinal injections  2006-2007   "maybe 10 injections for herniated discs"   TONSILLECTOMY     "  when I was real young"   TOTAL KNEE ARTHROPLASTY Left 10/09/2015   Procedure: TOTAL KNEE ARTHROPLASTY;  Surgeon: Dorna Leitz, MD;  Location: [facility];  Service: Orthopedics;  Laterality: Left;   TOTAL KNEE ARTHROPLASTY Right 04/04/2016   Procedure: TOTAL KNEE ARTHROPLASTY;  Surgeon: Dorna Leitz, MD;  Location: [facility];  Service: Orthopedics;  Laterality: Right;     Social History:   reports that he quit smoking about 26 years ago. His smoking use included cigarettes. He has a 37.50 pack-year smoking history. He has never used smokeless tobacco. He reports that he does not drink alcohol and does not use drugs.   Family History:  His family history includes Asthma in his brother, father, and sister; Breast cancer in his sister; Emphysema in his father; Liver cancer in his sister.   Allergies Allergies  Allergen Reactions   Wellbutrin [Bupropion] Other (See Comments)     Hallucinations, sweating   Amoxicillin Palpitations     Home Medications  Prior to Admission medications   Medication Sig Start Date End Date Taking? Authorizing Provider  amLODipine (NORVASC) 5 MG tablet TAKE 1 TABLET BY MOUTH EVERYDAY AT BEDTIME 02/02/22   [name], MD  atorvastatin (LIPITOR) 80 MG tablet TAKE 1 TABLET BY MOUTH ONCE  DAILY 02/10/22   [name], MD  Carboxymethylcellulose Sodium 1 % GEL Place 1 application into both eyes at bedtime as needed (Dry eye).    [provider]  clonazePAM (KLONOPIN) 0.5 MG tablet TAKE 1 TABLET BY MOUTH AT BEDTIME. 03/09/22   [name], MD  Daridorexant HCl (QUVIVIQ) 50 MG TABS Take 1 tablet by mouth at bedtime. 02/11/22   [name], MD  ezetimibe (ZETIA) 10 MG tablet TAKE 1 TABLET BY MOUTH  DAILY 12/20/21   Martinique, Peter M, MD  ferrous sulfate 325 (65 FE) MG EC tablet TAKE 1 TABLET BY MOUTH DAILY WITH BREAKFAST. SCHEDULE AN APPOINTMENT FOR FURTHER REFILLS 01/03/22   [place]ique, Peter M, MD  pantoprazole (PROTONIX) 40 MG tablet Take 1 tablet (40 mg total) by mouth daily. 02/10/22   [name], MD  polyethylene glycol (MIRALAX / GLYCOLAX) 17 g packet Take 17 g by mouth daily. With coffee    [provider]  PREDNISONE PO Take by mouth.    [provider]  telmisartan-hydrochlorothiazide (MICARDIS HCT) 80-25 MG tablet Take 1 tablet by mouth daily. 08/16/21   [name], MD  tiZANidine (ZANAFLEX) 4 MG tablet Take 4 mg by mouth every 8 (eight) hours as needed. 01/25/22   [provider]  traMADol (ULTRAM) 50 MG tablet TAKE 2 TABLETS (100 MG TOTAL) BY MOUTH EVERY 6 (SIX) HOURS AS NEEDED FOR MODERATE PAIN - SEVERE PAIN 03/09/22   [name], MD  traZODone (DESYREL) 50 MG tablet TAKE 1 TABLET BY MOUTH EVERYDAY AT BEDTIME 03/09/22   [name], MD  venlafaxine XR (EFFEXOR-XR) 150 MG 24 hr capsule TAKE 1 CAPSULE BY MOUTH DAILY  WITH BREAKFAST 12/20/21   [name], MD  XARELTO 20 MG TABS tablet  TAKE 1 TABLET BY MOUTH DAILY  WITH SUPPER 03/06/22   [name], MD     Critical care time: 45 minutes    CRITICAL CARE Performed by: Otilio Carpen Agapito Hanway   Total critical care time: 45 minutes  Critical care time was exclusive of separately billable procedures and treating other patients.  Critical care was necessary to treat or prevent imminent or life-threatening deterioration.  Critical  care was time spent personally by me on the following activities: development of treatment plan with patient and/or surrogate as well as nursing, discussions with consultants, evaluation of patient's response to treatment, examination of patient, obtaining history from patient or surrogate, ordering and performing treatments and interventions, ordering and review of laboratory studies, ordering and review of radiographic studies, pulse oximetry and re-evaluation of patient's condition.  Otilio Carpen Catalaya Garr, PA-C [facility] See Amion for pager If no response to pager , please call [phone] until 7pm After 7:00 pm call Elink  [phone]

## 2022-03-24 NOTE — Progress Notes (Signed)
   Patient seen at the bedside and endoscopy and case discussed with Dr. Tarri Glenn and Dr. Ermalene Postin.  Endoscopic findings confirmed GAVE, for which he underwent APC and Hemospray.  He has been intubated for the procedure and underwent TEE as well by Dr. Ermalene Postin, which I reviewed, demonstrating mild global hypokinesis with some septal dyskinesis.  The LV is dilated.  The pulmonary artery is dilated.  There is at least moderate TR.  Patient was noted to be hypoxic and and has already had multiple transfusions.  He will likely need diuresis.  Plan for chest x-ray since he is status post CPR rule out any pneumothorax.  Advise transfer to the ICU intubated where we will diurese him and extubate him as tolerated.  I discussed my recommendations with his wife as well as our plan to proceed with left heart catheterization tomorrow.  She is agreeable to this on his behalf.  We will review the risks and benefits of catheterization with the patient tomorrow morning, hopefully after he is extubated.  Shared Decision Making/Informed Consent The risks [stroke (1 in 1000), death (1 in 1000), kidney failure [usually temporary] (1 in 500), bleeding (1 in 200), allergic reaction [possibly serious] (1 in 200)], benefits (diagnostic support and management of coronary artery disease) and alternatives of a cardiac catheterization were discussed in detail with Aaron [name] and he is willing to proceed.  Pixie Casino, MD, [facility], [place] Director of the [facility]  Cardiovascular [facility] Diplomate of the American Board of Clinical Lipidology Attending Cardiologist  Direct Dial: [phone]  Fax: [phone]  Website:  [email or web address]

## 2022-03-24 NOTE — ED Notes (Signed)
Attempted secondary IV start x2 without success.

## 2022-03-24 NOTE — Progress Notes (Signed)
[facility] Progress Note Patient Name: Aaron [name] DOB: 1945/11/28 MRN: [id number]   Date of Service  03/24/2022  HPI/Events of Note  Patient with soft blood pressures related to high Propofol gtt infusion rates, hemoglobin is stable and blood pressure recovered once Propofol was titrated down, patient is requiring high infusion rates of Propofol to prevent ventilator dyssynchrony.  eICU Interventions  Fentanyl gtt added as a Propofol sparing strategy given the greater likelihood of very high Propofol gtt rates causing hypotension.        Frederik Pear 03/24/2022, 10:57 PM

## 2022-03-24 NOTE — Transfer of Care (Signed)
Immediate Anesthesia Transfer of Care Note  Patient: Aaron [name]  Procedure(s) Performed: ESOPHAGOGASTRODUODENOSCOPY (EGD) WITH PROPOFOL TRANSESOPHAGEAL ECHOCARDIOGRAM (TEE) HOT HEMOSTASIS (ARGON PLASMA COAGULATION/BICAP) HEMOSTASIS CONTROL BUBBLE STUDY  Patient Location: ICU  Anesthesia Type:General  Level of Consciousness: Patient remains intubated per anesthesia plan  Airway & Oxygen Therapy: Patient remains intubated per anesthesia plan and Patient placed on Ventilator (see vital sign flow sheet for setting)  Post-op Assessment: Report given to RN and Post -op Vital signs reviewed and stable  Post vital signs: Reviewed and stable  Last Vitals:  Vitals Value Taken Time  BP    Temp    Pulse    Resp    SpO2      Last Pain:  Vitals:   03/24/22 1506  TempSrc: Temporal  PainSc: 0-No pain         Complications: No notable events documented.

## 2022-03-24 NOTE — ED Notes (Signed)
Updated patient's wife Hassan Rowan.

## 2022-03-24 NOTE — ED Notes (Addendum)
Trauma Response Nurse Documentation   Aaron [name] is a 76 y.o. male arriving to [facility] ED via EMS  On Xarelto (rivaroxaban) daily. Trauma was activated as a Level 2 by ED charge RN based on the following trauma criteria Elderly patients > 65 with head trauma on anti-coagulation (excluding ASA). Trauma team at the bedside on patient arrival.   Patient cleared for CT by Dr. Dayna Barker EDP. Pt transported to CT with trauma response nurse present to monitor. RN remained with the patient throughout their absence from the department for clinical observation.   GCS 15.  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"   Heart murmur    Hyperlipidemia    Hypertension    Insomnia    Osteoarthritis    PONV (postoperative nausea and vomiting)    2006 - knee surgery   Pre-diabetes    Psoriasis    PTSD (post-traumatic stress disorder)    Pulmonary embolism ([place]) ~ 03/2006   bilaterally   Seasonal allergies    Shortness of breath 06/08/11   "w/exertion; that's why I'm here"   Sleep apnea, obstructive    Umbilical hernia      Past Surgical History:  Procedure Laterality Date   CARDIAC CATHETERIZATION     carpel tunnel release Bilateral ~ 06/2005/~ 08/2005   right/left   COLONOSCOPY W/ BIOPSIES AND POLYPECTOMY     CORONARY ANGIOPLASTY     CORONARY STENT PLACEMENT  02/13/2004   1   ENUCLEATION Right    age 64   EYE SURGERY     age 81; "ruptured  right pupil"   EYE SURGERY     age 8   [place]   placed artificial right eye   INGUINAL HERNIA REPAIR Right [place] ARTHROSCOPY  ~ 06/2005   right   MULTIPLE  TOOTH EXTRACTIONS     spinal injections  2006-2007   "maybe 10 injections for herniated discs"   TONSILLECTOMY     "when I was real young"   TOTAL KNEE ARTHROPLASTY Left 10/09/2015   Procedure: TOTAL KNEE ARTHROPLASTY;  Surgeon: Dorna Leitz, MD;  Location: [facility];  Service: Orthopedics;  Laterality: Left;   TOTAL KNEE ARTHROPLASTY Right 04/04/2016   Procedure: TOTAL KNEE ARTHROPLASTY;  Surgeon: Dorna Leitz, MD;  Location: [facility];  Service: Orthopedics;  Laterality: Right;       Initial Focused Assessment (If applicable, or please see trauma documentation): Alert/oriented male presents via EMS after a fall at 2130 this evening after getting dizzy. Pt called EMS today due to shortness of breath progressively worse over the last week. Airway patent/unobstructed, BS diminished/clear No obvious uncontrolled hemorrhage, Abrasions x2 to right head GCS 15  CT's Completed:   CT Head and CT C-Spine   Interventions:  CT head and c-spine Portable chest and right elbow xray Trauma lab draw CBG EKG  Plan for disposition:  Pending imaging, labs  Consults completed:  none at the  time of this note.  Event Summary: Dizzy, shob progressively x1 week. Fall at 2130 after a dizzy spell. Abrasion right head, posterior head, right elbow pain. Edema to lower extremities, abdomen distended, firm.  Manual BP 1280/70   MTP Summary (If applicable): NA  Bedside handoff with ED RN Aaron [name].    Aaron [name] O Aaron [name]  Trauma Response RN  Please call TRN at [phone] for further assistance.

## 2022-03-24 NOTE — Progress Notes (Signed)
Orthopedic Tech Progress Note Patient Details:  Aaron [name] [birth year] [id number]  Patient ID: Aaron [name], male   DOB: 05-24-1946, 76 y.o.   MRN: [id number] Level II; not currently needed. Aaron [name] 03/24/2022, 4:17 AM

## 2022-03-24 NOTE — ED Notes (Signed)
Trauma Event Note    VFIB arrest, compressions initiated and shocked x1, 1 round of epi given. ROSC after one round of CPR. EKG obained.   Last imported Vital Signs BP (!) 105/56   Pulse 73   Temp 98.1 F (36.7 C) (Oral)   Resp 17   Ht '5\' 6"'$  (1.676 m)   Wt 252 lb (114.3 kg)   SpO2 100%   BMI 40.67 kg/m   Trending CBC Recent Labs    03/24/22 0415  WBC 5.9  HGB 4.8*  HCT 16.8*  PLT 219    Trending Coag's No results for input(s): "APTT", "INR" in the last 72 hours.  Trending BMET Recent Labs    03/24/22 0415  NA 131*  K 4.0  CL 103  CO2 19*  BUN 28*  CREATININE 1.91*  GLUCOSE 122*      Tanganyika Bowlds O Alleta Avery  Trauma Response RN  Please call TRN at [phone] for further assistance.

## 2022-03-24 NOTE — Anesthesia Preprocedure Evaluation (Addendum)
Anesthesia Evaluation  Patient identified by MRN, date of birth, ID band Patient awake    Reviewed: Allergy & Precautions, NPO status , Patient's Chart, lab work & pertinent test results  History of Anesthesia Complications (+) PONV and history of anesthetic complications  Airway Mallampati: III  TM Distance: >3 FB Neck ROM: Full    Dental  (+) Edentulous Upper, Edentulous Lower   Pulmonary shortness of breath, sleep apnea , former smoker,     + wheezing      Cardiovascular hypertension, + CAD   Rhythm:Regular  St depression on ekg   Neuro/Psych PSYCHIATRIC DISORDERS Anxiety Depression    GI/Hepatic Neg liver ROS, GERD  ,  Endo/Other  Morbid obesity  Renal/GU Renal disease     Musculoskeletal   Abdominal   Peds  Hematology  (+) Blood dyscrasia, anemia ,   Anesthesia Other Findings Arrived anemic, s/p vfib arrest in ed with CPR and ROSC, egd to rule out gi bleed for cardiac workup, sob, o2 requirement, s/p 4 units of prbcs  Reproductive/Obstetrics                            Anesthesia Physical Anesthesia Plan  ASA: 4  Anesthesia Plan: General   Post-op Pain Management: Minimal or no pain anticipated   Induction: Intravenous  PONV Risk Score and Plan: 3 and Ondansetron and Dexamethasone  Airway Management Planned: Oral ETT  Additional Equipment: TEE  Intra-op Plan:   Post-operative Plan: Possible Post-op intubation/ventilation  Informed Consent: I have reviewed the patients History and Physical, chart, labs and discussed the procedure including the risks, benefits and alternatives for the proposed anesthesia with the patient or authorized representative who has indicated his/her understanding and acceptance.     Dental advisory given  Plan Discussed with: CRNA  Anesthesia Plan Comments:        Anesthesia Quick Evaluation

## 2022-03-24 NOTE — Consult Note (Signed)
Referring Provider: Dr. Merrily Pew Primary Care Physician:  [name], MD Primary Gastroenterologist:  Althia Forts  Reason for Consultation:  Anemia, + FOBT  HPI: Aaron [name] is a 76 y.o. male with a past medical history of anxiety, hypertension, hyperlipidemia, coronary artery disease s/p DES to the LAD 2005, PE/DVTs on Xarelto, IDA, obesity, obstructive sleep apnea, pre-diabetes, psoriasis, GERD  He developed dizziness with shortness of breath which progressively worsened over the past week who fell resulting in a head injury yesterday evening. He was [facility] to [facility] ED via EMS at [place] CT was negative. EKG showed severe global ischemia. Labs showed a WBC count of 5.9. Hg 4.8 (Hg 7.6 on 02/09/2022). HCT 16.8. PLT 219.  Troponin 5431. FOBT+.  Sodium 131.  BUN 28 ( BUN 13 on 08/10/2021).  Creatinine 1.91 ( Cr 1.02 on 08/10/2021).  Normal LFTs.  Three units of PRBCs ordered, first unit is transfusing at this time.  He had a V. Fib cardiac arrest s/p defib x 1 with ROSC. A GI consult was requested for further evaluation for profound anemia with + FOBT.   He has a history of GERD for which he takes Pantoprazole 40 mg once daily.  No ASA or other NSAID use as he is on Xarelto due to having past PE and recurrent DVT.  His dose of Xarelto was taken around 6 PM yesterday evening.  He denies having any dysphagia or heartburn.  No alcohol use.  He endorses having upper generalized abdominal pain for the past 3 months and intermittent loose black stools for undetermined period of time.  Infrequently sees bright red blood on the toilet tissue.  No melenic stools since arriving to the ED.   He has a history of IDA secondary to chronic gastrointestinal blood loss followed by hematologist Dr. Julien Nordmann.  He received 3 iron infusions in May and 3 iron infusions in August.  On ferrous sulfate 3 5 mg daily.  He was previously followed by Dr. Collene Mares.  He was scheduled for an EGD and colonoscopy  01/2021 but the patient canceled these procedures as he did not want to hold Xarelto as instructed.  He reported undergoing an EGD and colonoscopy more than 10 years ago possibly by Dr. Henrene Pastor or Dr. Collene Mares, further details are unclear at this time.  I was unable to locate any past  EGD/colonoscopy records in epic or Care Everywhere.  He denies having any chest pain or palpitations.  No shortness of breath at this time.  No hemoptysis.  He developed lower extremity swelling x 2 weeks which is worse than any leg swelling he has had in the past.  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"   Heart murmur    Hyperlipidemia    Hypertension    Insomnia    Osteoarthritis    PONV (postoperative nausea and vomiting)    2006 - knee surgery   Pre-diabetes    Psoriasis    PTSD (post-traumatic stress disorder)    Pulmonary  embolism ([place]) ~ 03/2006   bilaterally   Seasonal allergies    Shortness of breath 06/08/11   "w/exertion; that's why I'm here"   Sleep apnea, obstructive    Umbilical hernia     Past Surgical History:  Procedure Laterality Date   CARDIAC CATHETERIZATION     carpel tunnel release Bilateral ~ 06/2005/~ 08/2005   right/left   COLONOSCOPY W/ BIOPSIES AND POLYPECTOMY     CORONARY ANGIOPLASTY     CORONARY STENT PLACEMENT  02/13/2004   1   ENUCLEATION Right    age 70   EYE SURGERY     age 80; "ruptured  right pupil"   EYE SURGERY     age 27   Dent   placed artificial right eye   INGUINAL HERNIA REPAIR Right [place] Left 1985   KNEE ARTHROSCOPY  ~ 06/2005   right   MULTIPLE TOOTH EXTRACTIONS     spinal injections   2006-2007   "maybe 10 injections for herniated discs"   TONSILLECTOMY     "when I was real young"   TOTAL KNEE ARTHROPLASTY Left 10/09/2015   Procedure: TOTAL KNEE ARTHROPLASTY;  Surgeon: Dorna Leitz, MD;  Location: [place];  Service: Orthopedics;  Laterality: Left;   TOTAL KNEE ARTHROPLASTY Right 04/04/2016   Procedure: TOTAL KNEE ARTHROPLASTY;  Surgeon: Dorna Leitz, MD;  Location: [place];  Service: Orthopedics;  Laterality: Right;    Prior to Admission medications   Medication Sig Start Date End Date Taking? Authorizing Provider  amLODipine (NORVASC) 5 MG tablet TAKE 1 TABLET BY MOUTH EVERYDAY AT BEDTIME 02/02/22   [name], MD  atorvastatin (LIPITOR) 80 MG tablet TAKE 1 TABLET BY MOUTH ONCE  DAILY 02/10/22   [name], MD  Carboxymethylcellulose Sodium 1 % GEL Place 1 application into both eyes at bedtime as needed (Dry eye).    [provider]  clonazePAM (KLONOPIN) 0.5 MG tablet TAKE 1 TABLET BY MOUTH AT BEDTIME. 03/09/22   [name], MD  Daridorexant HCl (QUVIVIQ) 50 MG TABS Take 1 tablet by mouth at bedtime. 02/11/22   [name], MD  ezetimibe (ZETIA) 10 MG tablet TAKE 1 TABLET BY MOUTH  DAILY 12/20/21   [place]ique, Peter M, MD  ferrous sulfate 325 (65 FE) MG EC tablet TAKE 1 TABLET BY MOUTH DAILY WITH BREAKFAST. SCHEDULE AN APPOINTMENT FOR FURTHER REFILLS 01/03/22   [place], Peter M, MD  pantoprazole (PROTONIX) 40 MG tablet Take 1 tablet (40 mg total) by mouth daily. 02/10/22   [name], MD  polyethylene glycol (MIRALAX / GLYCOLAX) 17 g packet Take 17 g by mouth daily. With coffee    [provider]  PREDNISONE PO Take by mouth.    [provider]  telmisartan-hydrochlorothiazide (MICARDIS HCT) 80-25 MG tablet Take 1 tablet by mouth daily. 08/16/21   [name], MD  tiZANidine (ZANAFLEX) 4 MG tablet Take 4 mg by mouth every 8 (eight) hours as needed. 01/25/22   [provider]  traMADol (ULTRAM) 50 MG tablet TAKE 2 TABLETS (100 MG  TOTAL) BY MOUTH EVERY 6 (SIX) HOURS AS NEEDED FOR MODERATE PAIN - SEVERE PAIN 03/09/22   [name], MD  traZODone (DESYREL) 50 MG tablet TAKE 1 TABLET BY MOUTH EVERYDAY AT BEDTIME 03/09/22   [name], MD  venlafaxine XR (EFFEXOR-XR) 150 MG 24 hr capsule TAKE 1 CAPSULE BY MOUTH DAILY  WITH BREAKFAST 12/20/21   Ronnald Ramp,  Arvid Right, MD  XARELTO 20 MG TABS tablet TAKE 1 TABLET BY MOUTH DAILY  WITH SUPPER 03/06/22   [name], MD    Current Facility-Administered Medications  Medication Dose Route Frequency Provider Last Rate Last Admin   0.9 %  sodium chloride infusion  10 mL/hr Intravenous Once Mesner, Corene Cornea, MD       Current Outpatient Medications  Medication Sig Dispense Refill   amLODipine (NORVASC) 5 MG tablet TAKE 1 TABLET BY MOUTH EVERYDAY AT BEDTIME 90 tablet 0   atorvastatin (LIPITOR) 80 MG tablet TAKE 1 TABLET BY MOUTH ONCE  DAILY 100 tablet 0   Carboxymethylcellulose Sodium 1 % GEL Place 1 application into both eyes at bedtime as needed (Dry eye).     clonazePAM (KLONOPIN) 0.5 MG tablet TAKE 1 TABLET BY MOUTH AT BEDTIME. 90 tablet 0   Daridorexant HCl (QUVIVIQ) 50 MG TABS Take 1 tablet by mouth at bedtime. 90 tablet 1   ezetimibe (ZETIA) 10 MG tablet TAKE 1 TABLET BY MOUTH  DAILY 90 tablet 3   ferrous sulfate 325 (65 FE) MG EC tablet TAKE 1 TABLET BY MOUTH DAILY WITH BREAKFAST. SCHEDULE AN APPOINTMENT FOR FURTHER REFILLS 60 tablet 3   pantoprazole (PROTONIX) 40 MG tablet Take 1 tablet (40 mg total) by mouth daily. 90 tablet 1   polyethylene glycol (MIRALAX / GLYCOLAX) 17 g packet Take 17 g by mouth daily. With coffee     PREDNISONE PO Take by mouth.     telmisartan-hydrochlorothiazide (MICARDIS HCT) 80-25 MG tablet Take 1 tablet by mouth daily. 90 tablet 1   tiZANidine (ZANAFLEX) 4 MG tablet Take 4 mg by mouth every 8 (eight) hours as needed.     traMADol (ULTRAM) 50 MG tablet TAKE 2 TABLETS (100 MG TOTAL) BY MOUTH EVERY 6 (SIX) HOURS AS NEEDED FOR MODERATE PAIN - SEVERE PAIN  240 tablet 0   traZODone (DESYREL) 50 MG tablet TAKE 1 TABLET BY MOUTH EVERYDAY AT BEDTIME 90 tablet 1   venlafaxine XR (EFFEXOR-XR) 150 MG 24 hr capsule TAKE 1 CAPSULE BY MOUTH DAILY  WITH BREAKFAST 90 capsule 0   XARELTO 20 MG TABS tablet TAKE 1 TABLET BY MOUTH DAILY  WITH SUPPER 90 tablet 0    Allergies as of 03/24/2022 - Review Complete 03/24/2022  Allergen Reaction Noted   Wellbutrin [bupropion] Other (See Comments) 09/25/2015   Amoxicillin Palpitations 11/02/2010    Family History  Problem Relation Age of Onset   Asthma Father    Emphysema Father    Breast cancer Sister    Liver cancer Sister    Asthma Sister    Asthma Brother     Social History   Socioeconomic History   Marital status: Married    Spouse name: Not on file   Number of children: 3   Years of education: Not on file   Highest education level: Not on file  Occupational History   Occupation: building maintenance    Comment: disabled  Tobacco Use   Smoking status: Former    Packs/day: 1.50    Years: 25.00    Total pack years: 37.50    Types: Cigarettes    Quit date: 07/10/1995    Years since quitting: 26.7   Smokeless tobacco: Never  Vaping Use   Vaping Use: Never used  Substance and Sexual Activity   Alcohol use: No   Drug use: No   Sexual activity: Not on file  Other Topics Concern   Not on file  Social  History Narrative   Former Curator, 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   Recent stress, currently undergoing financial difficulties.   Social Determinants of Health   Financial Resource Strain: Low Risk  (10/29/2021)   Overall Financial Resource Strain (CARDIA)    Difficulty of Paying Living Expenses: Not hard at all  Food Insecurity: No Food Insecurity (10/29/2021)   Hunger Vital Sign    Worried About Running Out of Food in the Last Year: Never true    Ran Out of Food in the Last Year: Never true  Transportation Needs: No Transportation Needs (10/29/2021)   PRAPARE -  Hydrologist (Medical): No    Lack of Transportation (Non-Medical): No  Physical Activity: Insufficiently Active (10/29/2021)   Exercise Vital Sign    Days of Exercise per Week: 3 days    Minutes of Exercise per Session: 30 min  Stress: No Stress Concern Present (10/29/2021)   [place]    Feeling of Stress : Not at all  Social Connections: Socially Isolated (10/29/2021)   Social Connection and Isolation Panel [NHANES]    Frequency of Communication with Friends and Family: Three times a week    Frequency of Social Gatherings with Friends and Family: Three times a week    Attends Religious Services: Never    Active Member of Clubs or Organizations: No    Attends Archivist Meetings: Never    Marital Status: Widowed  Intimate Partner Violence: Not At Risk (10/29/2021)   Humiliation, Afraid, Rape, and Kick questionnaire    Fear of Current or Ex-Partner: No    Emotionally Abused: No    Physically Abused: No    Sexually Abused: No    Review of Systems: Gen: Denies fever, sweats or chills. No weight loss.  CV: No chest pain.  Lower extremity edema for the past 2 to 3 weeks.   Resp: Denies cough, shortness of breath of hemoptysis.  GI: See HPI. GU : Denies urinary burning, blood in urine, increased urinary frequency or incontinence. MS: Denies joint pain, muscles aches or weakness. Derm: Denies rash, itchiness, skin lesions or unhealing ulcers. Psych: Denies depression, anxiety or memory loss. Heme: Denies easy bruising, bleeding. Neuro:  Denies headaches, dizziness or paresthesias. Endo:  Denies any problems with DM, thyroid or adrenal function.  Physical Exam: Vital signs in last 24 hours: Temp:  [98.6 F (37 C)] 98.6 F (37 C) (09/21 0403) Pulse Rate:  [78] 78 (09/21 0403) Resp:  [20] 20 (09/21 0403) BP: (128)/(70) 128/70 (09/21 0403) SpO2:  [100 %] 100 % (09/21  0403) Weight:  [114.3 kg] 114.3 kg (09/21 0407)   General:  Alert, well-developed, well-nourished, pleasant and cooperative in NAD. Head: Right head abrasions active bleeding. Eyes:  No scleral icterus. Conjunctiva pink. Ears:  Normal auditory acuity. Nose:  No deformity, discharge or lesions. Mouth:  Dentition intact. No ulcers or lesions.  Neck:  Supple. No lymphadenopathy or thyromegaly.  Lungs: Breath sounds clear throughout. Heart: Good rate and rhythm, no murmurs.  Defib pads and monitor intact. Abdomen: Moderate abdominal distention.  Nontender.  Tympanic to percussion.  Nontender.  Umbilical hernia.  Positive bowel sounds to all 4 quadrants. Rectal: Deferred. Musculoskeletal:  Symmetrical without gross deformities.  Pulses:  Normal pulses noted. Extremities: Bilateral lower extremities with 2+ pitting edema. Neurologic:  Alert and  oriented x 4. No focal deficits.  Skin:  Intact without significant lesions or rashes.  Psych:  Alert and cooperative. Normal mood and affect.  Intake/Output from previous day: No intake/output data recorded. Intake/Output this shift: No intake/output data recorded.  Lab Results: Recent Labs    03/24/22 0415  WBC 5.9  HGB 4.8*  HCT 16.8*  PLT 219   BMET No results for input(s): "NA", "K", "CL", "CO2", "GLUCOSE", "BUN", "CREATININE", "CALCIUM" in the last 72 hours. LFT No results for input(s): "PROT", "ALBUMIN", "AST", "ALT", "ALKPHOS", "BILITOT", "BILIDIR", "IBILI" in the last 72 hours. PT/INR No results for input(s): "LABPROT", "INR" in the last 72 hours. Hepatitis Panel No results for input(s): "HEPBSAG", "HCVAB", "HEPAIGM", "HEPBIGM" in the last 72 hours.    Studies/Results: CT Head Wo Contrast  Result Date: 03/24/2022 CLINICAL DATA:  Head trauma with ataxia EXAM: CT HEAD WITHOUT CONTRAST CT CERVICAL SPINE WITHOUT CONTRAST TECHNIQUE: Multidetector CT imaging of the head and cervical spine was performed following the standard  protocol without intravenous contrast. Multiplanar CT image reconstructions of the cervical spine were also generated. RADIATION DOSE REDUCTION: This exam was performed according to the departmental dose-optimization program which includes automated exposure control, adjustment of the mA and/or kV according to patient size and/or use of iterative reconstruction technique. COMPARISON:  None Available. FINDINGS: CT HEAD FINDINGS Brain: No evidence of acute infarction, hemorrhage, hydrocephalus, extra-axial collection or mass lesion/mass effect. Generalized cerebral volume loss and patchy chronic small vessel ischemia in the hemispheric white matter. Vascular: Atheromatous calcification Skull: No acute fracture Sinuses/Orbits: No evidence of injury. Right enucleation with prosthesis. Left cataract resection. CT CERVICAL SPINE FINDINGS Alignment: Normal Skull base and vertebrae: Chronic marginated lucency through the right C1 lateral mass at the atlantooccipital joint. No acute fracture or aggressive bone lesion. Soft tissues and spinal canal: No prevertebral fluid or swelling. No visible canal hematoma. Disc levels: Multilevel spondylitic spurring. Milder facet spurring with C2-3 ankylosis. Upper chest: No visible injury IMPRESSION: No evidence of acute intracranial or cervical spine injury. Electronically Signed   By: Jorje Guild M.D.   On: 03/24/2022 04:49   CT Cervical Spine Wo Contrast  Result Date: 03/24/2022 CLINICAL DATA:  Head trauma with ataxia EXAM: CT HEAD WITHOUT CONTRAST CT CERVICAL SPINE WITHOUT CONTRAST TECHNIQUE: Multidetector CT imaging of the head and cervical spine was performed following the standard protocol without intravenous contrast. Multiplanar CT image reconstructions of the cervical spine were also generated. RADIATION DOSE REDUCTION: This exam was performed according to the departmental dose-optimization program which includes automated exposure control, adjustment of the mA and/or  kV according to patient size and/or use of iterative reconstruction technique. COMPARISON:  None Available. FINDINGS: CT HEAD FINDINGS Brain: No evidence of acute infarction, hemorrhage, hydrocephalus, extra-axial collection or mass lesion/mass effect. Generalized cerebral volume loss and patchy chronic small vessel ischemia in the hemispheric white matter. Vascular: Atheromatous calcification Skull: No acute fracture Sinuses/Orbits: No evidence of injury. Right enucleation with prosthesis. Left cataract resection. CT CERVICAL SPINE FINDINGS Alignment: Normal Skull base and vertebrae: Chronic marginated lucency through the right C1 lateral mass at the atlantooccipital joint. No acute fracture or aggressive bone lesion. Soft tissues and spinal canal: No prevertebral fluid or swelling. No visible canal hematoma. Disc levels: Multilevel spondylitic spurring. Milder facet spurring with C2-3 ankylosis. Upper chest: No visible injury IMPRESSION: No evidence of acute intracranial or cervical spine injury. Electronically Signed   By: Jorje Guild M.D.   On: 03/24/2022 04:49   DG Elbow 2 Views Right  Result Date: 03/24/2022 CLINICAL DATA:  Level 2 trauma, fall on blood thinners  EXAM: RIGHT ELBOW - 2 VIEW COMPARISON:  None Available. FINDINGS: There is no evidence of fracture, dislocation, or joint effusion. There is no evidence of arthropathy or other focal bone abnormality. Soft tissues are unremarkable. IMPRESSION: Negative. Electronically Signed   By: Jorje Guild M.D.   On: 03/24/2022 04:44   DG Chest Portable 1 View  Result Date: 03/24/2022 CLINICAL DATA:  Level 2 fall on blood thinners EXAM: PORTABLE CHEST 1 VIEW COMPARISON:  10/12/2020 FINDINGS: Hyperinflation. There is no edema, consolidation, effusion, or pneumothorax. Normal heart size when accounting for mediastinal fat. Stable mediastinal contours. Artifact from EKG leads. IMPRESSION: 1. No acute finding. 2. Hyperinflation. Electronically Signed    By: Jorje Guild M.D.   On: 03/24/2022 04:44    IMPRESSION/PLAN:  76 year old male with a history of chronic IDA who developed dizziness, SOB x one week who fell at home yesterday evening resulting in a head injury.  Intermittent melenic stools.  Labs in the ED showed profound anemia. Hg 4.8. FOBT +.  Three units of PRBCs ordered, first unit transfusing at this time.  On Xarelto.  Head CT was negative. -NPO -Check H/H post transfusion then Q 6 hours x 24 hours  -PPI IV bid, changed to continuous infusion if he demonstrates active upper GI bleeding -Endoscopic evaluation deferred until hemodynamically stable and cardiac status stabilized  -Await further recommendations per Dr. Tarri Glenn  S/P V. Fib arrest, defib x 1 with ROSC.  EKG with ischemic changes.  Troponin 5,431. Received ASA '325mg'$  po x 1.  History of CAD s/p DES   Acute heart failure   Recurrent PE/DVTs on Xarelto.  Last dose of Xarelto was taken around 6 PM 03/23/2022.  AKI       Noralyn Pick  03/24/2022, 8:11AM

## 2022-03-25 ENCOUNTER — Encounter (HOSPITAL_COMMUNITY): Payer: Self-pay | Admitting: Internal Medicine

## 2022-03-25 ENCOUNTER — Telehealth: Payer: Self-pay

## 2022-03-25 ENCOUNTER — Encounter (HOSPITAL_COMMUNITY)
Admission: EM | Disposition: A | Discharge status: Home Health | Payer: Self-pay | Source: Home / Self Care | Attending: Internal Medicine

## 2022-03-25 DIAGNOSIS — K31819 Angiodysplasia of stomach and duodenum without bleeding: Secondary | ICD-10-CM

## 2022-03-25 DIAGNOSIS — D649 Anemia, unspecified: Secondary | ICD-10-CM | POA: Diagnosis not present

## 2022-03-25 DIAGNOSIS — K921 Melena: Secondary | ICD-10-CM | POA: Diagnosis not present

## 2022-03-25 DIAGNOSIS — I5033 Acute on chronic diastolic (congestive) heart failure: Secondary | ICD-10-CM | POA: Diagnosis present

## 2022-03-25 DIAGNOSIS — I469 Cardiac arrest, cause unspecified: Secondary | ICD-10-CM | POA: Diagnosis not present

## 2022-03-25 DIAGNOSIS — I2510 Atherosclerotic heart disease of native coronary artery without angina pectoris: Secondary | ICD-10-CM | POA: Diagnosis not present

## 2022-03-25 DIAGNOSIS — I5021 Acute systolic (congestive) heart failure: Secondary | ICD-10-CM

## 2022-03-25 DIAGNOSIS — I4901 Ventricular fibrillation: Secondary | ICD-10-CM | POA: Diagnosis not present

## 2022-03-25 HISTORY — PX: LEFT HEART CATH AND CORONARY ANGIOGRAPHY: CATH118249

## 2022-03-25 LAB — BASIC METABOLIC PANEL
Anion gap: 13 (ref 5–15)
BUN: 31 mg/dL — ABNORMAL HIGH (ref 8–23)
CO2: 18 mmol/L — ABNORMAL LOW (ref 22–32)
Calcium: 7.7 mg/dL — ABNORMAL LOW (ref 8.9–10.3)
Chloride: 100 mmol/L (ref 98–111)
Creatinine, Ser: 1.82 mg/dL — ABNORMAL HIGH (ref 0.61–1.24)
GFR, Estimated: 38 mL/min — ABNORMAL LOW (ref >60)
Glucose, Bld: 142 mg/dL — ABNORMAL HIGH (ref 70–99)
Potassium: 3.8 mmol/L (ref 3.5–5.1)
Sodium: 131 mmol/L — ABNORMAL LOW (ref 135–145)

## 2022-03-25 LAB — HEMOGLOBIN A1C
Hgb A1c MFr Bld: 4.3 % — ABNORMAL LOW (ref 4.8–5.6)
Mean Plasma Glucose: 76.71 mg/dL

## 2022-03-25 LAB — GLUCOSE, CAPILLARY
Glucose-Capillary: 171 mg/dL — ABNORMAL HIGH (ref 70–99)
Glucose-Capillary: 146 mg/dL — ABNORMAL HIGH (ref 70–99)
Glucose-Capillary: 125 mg/dL — ABNORMAL HIGH (ref 70–99)
Glucose-Capillary: 123 mg/dL — ABNORMAL HIGH (ref 70–99)
Glucose-Capillary: 99 mg/dL (ref 70–99)

## 2022-03-25 LAB — TYPE AND SCREEN
ABO/RH(D): O POS
Antibody Screen: NEGATIVE
Unit division: 0
Unit division: 0
Unit division: 0
Unit division: 0

## 2022-03-25 LAB — PHOSPHORUS: Phosphorus: 5.3 mg/dL — ABNORMAL HIGH (ref 2.5–4.6)

## 2022-03-25 LAB — BPAM RBC
Blood Product Expiration Date: 202309282359
Blood Product Expiration Date: 202310172359
Blood Product Expiration Date: 202310232359
Blood Product Expiration Date: 202310232359
ISSUE DATE / TIME: 202309210457
ISSUE DATE / TIME: 202309210616
ISSUE DATE / TIME: 202309210826
ISSUE DATE / TIME: 202309211057
Unit Type and Rh: 5100
Unit Type and Rh: 5100
Unit Type and Rh: 5100
Unit Type and Rh: 5100

## 2022-03-25 LAB — TRIGLYCERIDES: Triglycerides: 823 mg/dL — ABNORMAL HIGH (ref <150)

## 2022-03-25 LAB — CBC
HCT: 21.5 % — ABNORMAL LOW (ref 39.0–52.0)
HCT: 27.5 % — ABNORMAL LOW (ref 39.0–52.0)
Hemoglobin: 7.3 g/dL — ABNORMAL LOW (ref 13.0–17.0)
Hemoglobin: 8.7 g/dL — ABNORMAL LOW (ref 13.0–17.0)
MCH: 28.9 pg (ref 26.0–34.0)
MCH: 26.6 pg (ref 26.0–34.0)
MCHC: 34 g/dL (ref 30.0–36.0)
MCHC: 31.6 g/dL (ref 30.0–36.0)
MCV: 85 fL (ref 80.0–100.0)
MCV: 84.1 fL (ref 80.0–100.0)
Platelets: 209 10*3/uL (ref 150–400)
Platelets: 314 10*3/uL (ref 150–400)
RBC: 2.53 MIL/uL — ABNORMAL LOW (ref 4.22–5.81)
RBC: 3.27 MIL/uL — ABNORMAL LOW (ref 4.22–5.81)
RDW: 16.3 % — ABNORMAL HIGH (ref 11.5–15.5)
RDW: 16 % — ABNORMAL HIGH (ref 11.5–15.5)
WBC: 6.3 10*3/uL (ref 4.0–10.5)
WBC: 13.8 10*3/uL — ABNORMAL HIGH (ref 4.0–10.5)
nRBC: 0.3 % — ABNORMAL HIGH (ref 0.0–0.2)
nRBC: 0.2 % (ref 0.0–0.2)

## 2022-03-25 LAB — MAGNESIUM: Magnesium: 2.1 mg/dL (ref 1.7–2.4)

## 2022-03-25 SURGERY — LEFT HEART CATH AND CORONARY ANGIOGRAPHY
Anesthesia: LOCAL

## 2022-03-25 MED ORDER — TRAZODONE HCL 50 MG PO TABS
50.0000 mg | ORAL_TABLET | Freq: Every day | ORAL | Status: DC
  Administered 2022-03-25 – 2022-04-03 (×10): 50 mg via ORAL
  Filled 2022-03-25 (×10): qty 1

## 2022-03-25 MED ORDER — LABETALOL HCL 5 MG/ML IV SOLN: 10.0000 mg | INTRAVENOUS | Status: AC | PRN

## 2022-03-25 MED ORDER — VENLAFAXINE HCL ER 150 MG PO CP24
150.0000 mg | ORAL_CAPSULE | Freq: Every day | ORAL | Status: DC
  Administered 2022-03-25 – 2022-04-04 (×11): 150 mg via ORAL
  Filled 2022-03-25 (×11): qty 1

## 2022-03-25 MED ORDER — SODIUM CHLORIDE 0.9% FLUSH: 3.0000 mL | INTRAVENOUS | Status: DC | PRN

## 2022-03-25 MED ORDER — PANTOPRAZOLE SODIUM 40 MG PO TBEC: 40.0000 mg | DELAYED_RELEASE_TABLET | Freq: Every day | ORAL | Status: DC

## 2022-03-25 MED ORDER — MELATONIN 3 MG PO TABS: 3.0000 mg | ORAL_TABLET | Freq: Once | ORAL | Status: DC

## 2022-03-25 MED ORDER — SODIUM CHLORIDE 0.9 % IV SOLN: INTRAVENOUS | Status: DC

## 2022-03-25 MED ORDER — AMLODIPINE BESYLATE 5 MG PO TABS
5.0000 mg | ORAL_TABLET | Freq: Every day | ORAL | Status: DC
  Administered 2022-03-25 – 2022-03-31 (×7): 5 mg via ORAL
  Filled 2022-03-25 (×7): qty 1

## 2022-03-25 MED ORDER — EZETIMIBE 10 MG PO TABS
10.0000 mg | ORAL_TABLET | Freq: Every day | ORAL | Status: DC
  Administered 2022-03-25 – 2022-04-04 (×11): 10 mg via ORAL
  Filled 2022-03-25 (×11): qty 1

## 2022-03-25 MED ORDER — TRAMADOL HCL 50 MG PO TABS
50.0000 mg | ORAL_TABLET | Freq: Four times a day (QID) | ORAL | Status: DC | PRN
  Administered 2022-03-25 – 2022-03-26 (×2): 50 mg via ORAL
  Filled 2022-03-25 (×2): qty 1

## 2022-03-25 MED ORDER — SODIUM CHLORIDE 0.9 % IV SOLN: 250.0000 mL | INTRAVENOUS | Status: DC | PRN

## 2022-03-25 MED ORDER — VERAPAMIL HCL 2.5 MG/ML IV SOLN
INTRAVENOUS | Status: DC | PRN
  Administered 2022-03-25: 10 mL via INTRA_ARTERIAL

## 2022-03-25 MED ORDER — LIDOCAINE HCL (PF) 1 % IJ SOLN
INTRAMUSCULAR | Status: DC | PRN
  Administered 2022-03-25: 2 mL

## 2022-03-25 MED ORDER — FENTANYL CITRATE PF 50 MCG/ML IJ SOSY: 25.0000 ug | PREFILLED_SYRINGE | INTRAMUSCULAR | Status: DC | PRN

## 2022-03-25 MED ORDER — CLONAZEPAM 0.5 MG PO TABS
0.5000 mg | ORAL_TABLET | Freq: Every day | ORAL | Status: DC
  Administered 2022-03-25 – 2022-04-03 (×10): 0.5 mg via ORAL
  Filled 2022-03-25 (×10): qty 1

## 2022-03-25 MED ORDER — ALUM & MAG HYDROXIDE-SIMETH 200-200-20 MG/5ML PO SUSP
15.0000 mL | ORAL | Status: DC | PRN
  Administered 2022-03-25 – 2022-03-26 (×2): 15 mL via ORAL
  Filled 2022-03-25 (×2): qty 30

## 2022-03-25 MED ORDER — FENTANYL CITRATE (PF) 100 MCG/2ML IJ SOLN
INTRAMUSCULAR | Status: AC
  Filled 2022-03-25: qty 2

## 2022-03-25 MED ORDER — ACETAMINOPHEN 10 MG/ML IV SOLN: 1000.0000 mg | Freq: Once | INTRAVENOUS | Status: AC

## 2022-03-25 MED ORDER — ACETAMINOPHEN 325 MG PO TABS
650.0000 mg | ORAL_TABLET | ORAL | Status: DC | PRN
  Filled 2022-03-25: qty 2

## 2022-03-25 MED ORDER — HEPARIN SODIUM (PORCINE) 1000 UNIT/ML IJ SOLN
INTRAMUSCULAR | Status: DC | PRN
  Administered 2022-03-25: 5000 [IU] via INTRA_ARTERIAL

## 2022-03-25 MED ORDER — VERAPAMIL HCL 2.5 MG/ML IV SOLN
INTRAVENOUS | Status: AC
  Filled 2022-03-25: qty 2

## 2022-03-25 MED ORDER — FENTANYL CITRATE (PF) 100 MCG/2ML IJ SOLN
INTRAMUSCULAR | Status: DC | PRN
  Administered 2022-03-25 (×3): 25 ug via INTRAVENOUS

## 2022-03-25 MED ORDER — HEPARIN (PORCINE) IN NACL 1000-0.9 UT/500ML-% IV SOLN
INTRAVENOUS | Status: DC | PRN
  Administered 2022-03-25: 500 mL

## 2022-03-25 MED ORDER — HYDRALAZINE HCL 20 MG/ML IJ SOLN: 10.0000 mg | INTRAMUSCULAR | Status: AC | PRN

## 2022-03-25 MED ORDER — LIDOCAINE HCL (PF) 1 % IJ SOLN
INTRAMUSCULAR | Status: AC
  Filled 2022-03-25: qty 30

## 2022-03-25 MED ORDER — INSULIN ASPART 100 UNIT/ML IJ SOLN
0.0000 [IU] | Freq: Three times a day (TID) | INTRAMUSCULAR | Status: DC
  Administered 2022-03-25 (×2): 2 [IU] via SUBCUTANEOUS
  Administered 2022-03-27: 3 [IU] via SUBCUTANEOUS
  Administered 2022-03-30 – 2022-04-02 (×3): 2 [IU] via SUBCUTANEOUS
  Administered 2022-04-04: 3 [IU] via SUBCUTANEOUS

## 2022-03-25 MED ORDER — FERROUS SULFATE 325 (65 FE) MG PO TABS
325.0000 mg | ORAL_TABLET | Freq: Every day | ORAL | Status: DC
  Administered 2022-03-26 – 2022-04-04 (×10): 325 mg via ORAL
  Filled 2022-03-25 (×10): qty 1

## 2022-03-25 MED ORDER — HEPARIN (PORCINE) IN NACL 1000-0.9 UT/500ML-% IV SOLN
INTRAVENOUS | Status: AC
  Filled 2022-03-25: qty 1000

## 2022-03-25 MED ORDER — ATORVASTATIN CALCIUM 80 MG PO TABS
80.0000 mg | ORAL_TABLET | Freq: Every day | ORAL | Status: DC
  Administered 2022-03-25 – 2022-04-04 (×11): 80 mg via ORAL
  Filled 2022-03-25 (×11): qty 1

## 2022-03-25 MED ORDER — SODIUM CHLORIDE 0.9% FLUSH
3.0000 mL | Freq: Two times a day (BID) | INTRAVENOUS | Status: DC
  Administered 2022-03-25 – 2022-04-04 (×19): 3 mL via INTRAVENOUS

## 2022-03-25 MED ORDER — SODIUM CHLORIDE 0.9% FLUSH: 3.0000 mL | Freq: Two times a day (BID) | INTRAVENOUS | Status: DC

## 2022-03-25 MED ORDER — IOHEXOL 350 MG/ML SOLN
INTRAVENOUS | Status: DC | PRN
  Administered 2022-03-25: 44 mL

## 2022-03-25 MED ORDER — PANTOPRAZOLE SODIUM 40 MG PO TBEC
40.0000 mg | DELAYED_RELEASE_TABLET | Freq: Every day | ORAL | Status: DC
  Administered 2022-03-26 – 2022-04-04 (×10): 40 mg via ORAL
  Filled 2022-03-25 (×10): qty 1

## 2022-03-25 MED ORDER — HEPARIN SODIUM (PORCINE) 1000 UNIT/ML IJ SOLN
INTRAMUSCULAR | Status: AC
  Filled 2022-03-25: qty 10

## 2022-03-25 MED ORDER — ONDANSETRON HCL 4 MG/2ML IJ SOLN
4.0000 mg | Freq: Four times a day (QID) | INTRAMUSCULAR | Status: DC | PRN
  Administered 2022-03-25 – 2022-03-26 (×2): 4 mg via INTRAVENOUS
  Filled 2022-03-25 (×2): qty 2

## 2022-03-25 SURGICAL SUPPLY — 13 items
CATH 5FR PIGTAIL DIAGNOSTIC (CATHETERS) IMPLANT
CATH INFINITI 5 FR JL3.5 (CATHETERS) IMPLANT
CATH OPTITORQUE TIG 4.0 5F (CATHETERS) IMPLANT
DEVICE RAD COMP TR BAND LRG (VASCULAR PRODUCTS) IMPLANT
GLIDESHEATH SLEND SS 6F .021 (SHEATH) IMPLANT
KIT HEART LEFT (KITS) ×1 IMPLANT
KIT SYRINGE INJ CVI SPIKEX1 (MISCELLANEOUS) IMPLANT
PACK CARDIAC CATHETERIZATION (CUSTOM PROCEDURE TRAY) ×1 IMPLANT
SET ATX SIMPLICITY (MISCELLANEOUS) IMPLANT
SHEATH PROBE COVER 6X72 (BAG) IMPLANT
TRANSDUCER W/STOPCOCK (MISCELLANEOUS) ×1 IMPLANT
TUBING CIL FLEX 10 FLL-RA (TUBING) ×1 IMPLANT
WIRE EMERALD 3MM-J .035X260CM (WIRE) IMPLANT

## 2022-03-25 NOTE — Progress Notes (Signed)
NAME:  Aaron [name], MRN:  [id number], DOB:  1946-04-29, LOS: 1 ADMISSION DATE:  03/24/2022, CONSULTATION DATE:  03/25/22 REFERRING MD:  EDP, CHIEF COMPLAINT:  weakness, fall   History of Present Illness:  Aaron [name] is a 76 y.o. M with PMH significant for CAD, DVT and PE on Xarelto, recent iron deficiency anemia for which he follows with Dr. Earlie Server and receives blood transfusions who presented after dizziness and fall from standing.  His wife states he has felt intermittently weak and dizzy for the last two months which they ascribed to anemia.   He also has been under a great deal of stress trying to de-clutter his house in order not to have his home-owner's insurance not be cancelled.   In the ED his Hgb was 4.8 and stool hemoccult positive.   EKG with ischemic changes and borderline low BP.    While in the ED, pt had a brief Vfib arrest.  Not intubated, awake and alert afterwards. Troponin 5431.  Trauma work-up negative.  GI and cardiology were consulted and PCCM asked to admit. Denies NSAID use, has chronic GERD sx's, no colonoscopy since 2012  Pertinent  Medical History   has a past medical history of Anemia, Anxiety, Blood transfusion, Cancer ([place]), Constipation due to pain medication, Coronary artery disease, Depression, Full dentures, GERD (gastroesophageal reflux disease), Headache(784.0), Heart murmur, Hyperlipidemia, Hypertension, Insomnia, Osteoarthritis, PONV (postoperative nausea and vomiting), Pre-diabetes, Psoriasis, PTSD (post-traumatic stress disorder), Pulmonary embolism ([place]) (~ 03/2006), Seasonal allergies, Shortness of breath (06/08/11), Sleep apnea, obstructive, and Umbilical hernia.   Significant Hospital Events: Including procedures, antibiotic start and stop dates in addition to other pertinent events   9/21 presented with dizziness and fall, concern for GIB and ACS 9/22 Seen this am in bed post cardiac cath with no acute complaints   Interim History / Subjective:   Alert and oriented x3, denies any acute complaints   Objective   Blood pressure (!) 92/46, pulse (!) 55, temperature 99 F (37.2 C), temperature source Oral, resp. rate 13, height '5\' 6"'$  (1.676 m), weight 114 kg, SpO2 99 %.    Vent Mode: PRVC FiO2 (%):  [40 %-100 %] 40 % Set Rate:  [16 bmp] 16 bmp Vt Set:  [510 mL] 510 mL PEEP:  [8 cmH20] 8 cmH20 Plateau Pressure:  [17 cmH20] 17 cmH20   Intake/Output Summary (Last 24 hours) at 03/25/2022 0710 Last data filed at 03/25/2022 0500 Gross per 24 hour  Intake 491.87 ml  Output 1735 ml  Net -1243.13 ml   Filed Weights   03/24/22 0407 03/24/22 1506 03/25/22 0413  Weight: 114.3 kg 114.3 kg 114 kg   Physical Exam  General: Very pleasant elderly male lying in bed in NAD  HEENT: [place]/AT, MM pink/moist, PERRL,  Neuro: Alert and oriented x3, non-focal CV: s1s2 regular rate and rhythm, no murmur, rubs, or gallops,  PULM:  Clear to ascultation, no increased work of breathing, no added breath sounds  GI: soft, bowel sounds active in all 4 quadrants, non-tender, non-distended Extremities: warm/dry, no edema  Skin: no rashes or lesions  Resolved Hospital Problem list     Assessment & Plan:   Active GI bleed  -Gastric antral vascular ectasia with bleeding on upper endoscopy 9/21 Treated with argon plasma coagulation (APC) with success. Acute on chronic Anemia, likely secondary to iron deficiency and slow GIB -Initial Hgb 4.8, borderline low BP -Stool hemoccult + -3 units PRBC's ordered P: GI following, appreciate assistance  Continue to trend  CBC  Transfuse per protocol  Hgb goal > 7 Continue BID PPI  Vfib arrest and concern for ACS  -Had acute VF arrest with 1 round of epi and chest compressions before ROSC was achieved  Chronic CAD -Underwent left heart cath am 9/22 with diffuse mild to moderate obstructive disease with patent mid LAD stent. No interventions Hx of HTN/HLD -Home medications include Norvasc and  Lipitor P: Cardiology following, appreciate assistance  Plan for cardiac cath  Resumption of anticoagulation per cardiology  Continuous telemetry  Resume home medications when appropriate   AKI -in the setting of slow GI bleed and brief cardiac arrest. Creatinine 1.02 with GFR 71 2/23, creatinine on admission 1.91 with GFR 36 P: Follow renal function  Monitor urine output Trend Bmet Avoid nephrotoxins Ensure adequate renal perfusion   Hx of PE anticoagulated with Eliquis  P: Will coordinate with all consulting team prior to resumption of anticoagulation   Best Practice (right click and "Reselect all SmartList Selections" daily)   Diet/type: NPO DVT prophylaxis: SCD GI prophylaxis: PPI Lines: N/A Foley:  N/A Code Status:  full code Last date of multidisciplinary goals of care discussion [wife and patient updated, confirmed full code]  Critical care time: NA  Inri Sobieski D. Kenton Kingfisher, NP-C [facility] Personal contact information can be found on Amion  03/25/2022, 7:22 AM

## 2022-03-25 NOTE — Progress Notes (Signed)
[facility] Progress Note Patient Name: Aaron [name] DOB: February 22, 1946 MRN: [id number]   Date of Service  03/25/2022  HPI/Events of Note  Patient is NPO for a procedure, he is asking for something for pain.  eICU Interventions  Tylenol 1000 mg iv x 1, Fentanyl 25 mcg iv Q 4 hours PRN x 2 doses.        Kerry Kass Bexley Laubach 03/25/2022, 12:49 AM

## 2022-03-25 NOTE — Progress Notes (Signed)
DAILY PROGRESS NOTE   Patient Name: Aaron [name] Date of Encounter: 03/25/2022 Cardiologist: Peter Martinique, MD  Chief Complaint   Hungry  Patient Profile   Aaron [name] is a 76 y.o. 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 He is being seen today (03/24/2022) due to ischemic ST changes on ECG and VF arrest in the ED.   Subjective   S/p LHC today which showed minimal non-obstructive CAD- very good news. Mildly reduced LVEF yesterday on TEE - suspect this will recover and was related to VF arrest. Plan to proceed with colonoscopy as well, I'm told. Was on Xarelto for chronic DVT/PE which is being held. Diuresed 1.7L negative yesterday, about net even. Hypotensive yesterday, but bp improved today. Hemoglobin at 7.3 today- suspect he will need more transfusion. Creatinine lower at 1.82.  Objective   Vitals:   03/25/22 0915 03/25/22 0916 03/25/22 0925 03/25/22 0945  BP:    138/63  Pulse: 85 84 88 79  Resp: '14 17 17 12  '$ Temp:      TempSrc:      SpO2: 94% 94% 93% 96%  Weight:      Height:        Intake/Output Summary (Last 24 hours) at 03/25/2022 1034 Last data filed at 03/25/2022 9326 Gross per 24 hour  Intake 1970.15 ml  Output 1860 ml  Net 110.15 ml   Filed Weights   03/24/22 0407 03/24/22 1506 03/25/22 0413  Weight: 114.3 kg 114.3 kg 114 kg    Physical Exam   General appearance: alert, no distress, and morbidly obese Neck: no carotid bruit, no JVD, and thyroid not enlarged, symmetric, no tenderness/mass/nodules Lungs: clear to auscultation bilaterally Heart: regular rate and rhythm Abdomen: soft, non-tender; bowel sounds normal; no masses,  no organomegaly and obese Extremities: extremities normal, atraumatic, no cyanosis or edema Pulses: 2+ and  symmetric Skin: pale, cool dry Neurologic: Grossly normal Psych: Pleasant  Inpatient Medications    Scheduled Meds:  Chlorhexidine Gluconate Cloth  6 each Topical Daily   insulin aspart  0-15 Units Subcutaneous TID WC   melatonin  3 mg Oral Once   mouth rinse  15 mL Mouth Rinse Q2H   pantoprazole (PROTONIX) IV  40 mg Intravenous Q12H   sodium chloride flush  3 mL Intravenous Q12H    Continuous Infusions:  sodium chloride 10 mL/hr at 03/25/22 7124   sodium chloride     acetaminophen Stopped (03/25/22 0059)    PRN Meds: sodium chloride, sodium chloride, acetaminophen, docusate sodium, hydrALAZINE, labetalol, ondansetron (ZOFRAN) IV, mouth rinse, polyethylene glycol, sodium chloride flush   Labs   Results for orders placed or performed during the hospital encounter of 03/24/22 (from the past 48 hour(s))  CBC with Differential     Status: Abnormal   Collection Time: 03/24/22  4:15 AM  Result Value Ref Range   WBC 5.9 4.0 - 10.5 K/uL   RBC 1.84 (L) 4.22 - 5.81 MIL/uL   Hemoglobin 4.8 (LL) 13.0 - 17.0 g/dL    Comment: REPEATED TO VERIFY THIS CRITICAL RESULT HAS VERIFIED AND BEEN CALLED TO RN BETH CROCKERY BY JOHN CLARK ON 09 21 2023 AT 0436, AND HAS BEEN READ BACK.     HCT 16.8 (L) 39.0 - 52.0 %   MCV 91.3 80.0 -  100.0 fL   MCH 26.1 26.0 - 34.0 pg   MCHC 28.6 (L) 30.0 - 36.0 g/dL   RDW 16.9 (H) 11.5 - 15.5 %   Platelets 219 150 - 400 K/uL   nRBC 0.3 (H) 0.0 - 0.2 %   Neutrophils Relative % 72 %   Neutro Abs 4.4 1.7 - 7.7 K/uL   Lymphocytes Relative 14 %   Lymphs Abs 0.8 0.7 - 4.0 K/uL   Monocytes Relative 11 %   Monocytes Absolute 0.6 0.1 - 1.0 K/uL   Eosinophils Relative 1 %   Eosinophils Absolute 0.1 0.0 - 0.5 K/uL   Basophils Relative 1 %   Basophils Absolute 0.0 0.0 - 0.1 K/uL   Immature Granulocytes 1 %   Abs Immature Granulocytes 0.04 0.00 - 0.07 K/uL    Comment: Performed at [facility] [address] [place], [place] [address]  Comprehensive  metabolic panel     Status: Abnormal   Collection Time: 03/24/22  4:15 AM  Result Value Ref Range   Sodium 131 (L) 135 - 145 mmol/L   Potassium 4.0 3.5 - 5.1 mmol/L   Chloride 103 98 - 111 mmol/L   CO2 19 (L) 22 - 32 mmol/L   Glucose, Bld 122 (H) 70 - 99 mg/dL    Comment: Glucose reference range applies only to samples taken after fasting for at least 8 hours.   BUN 28 (H) 8 - 23 mg/dL   Creatinine, Ser 1.91 (H) 0.61 - 1.24 mg/dL   Calcium 8.1 (L) 8.9 - 10.3 mg/dL   Total Protein 5.5 (L) 6.5 - 8.1 g/dL   Albumin 3.1 (L) 3.5 - 5.0 g/dL   AST 31 15 - 41 U/L   ALT 22 0 - 44 U/L   Alkaline Phosphatase 71 38 - 126 U/L   Total Bilirubin 0.2 (L) 0.3 - 1.2 mg/dL   GFR, Estimated 36 (L) >60 mL/min    Comment: (NOTE) Calculated using the CKD-EPI Creatinine Equation (2021)    Anion gap 9 5 - 15    Comment: Performed at [facility], [place] [address], [place], [place] [address]  Troponin I (High Sensitivity)     Status: Abnormal   Collection Time: 03/24/22  4:15 AM  Result Value Ref Range   Troponin I (High Sensitivity) 5,431 (HH) <18 ng/L    Comment: CRITICAL RESULT CALLED TO, READ BACK BY AND VERIFIED WITH B. CRABTREE, RN, [phone] 03/24/22, A. RAMSEY (NOTE) Elevated high sensitivity troponin I (hsTnI) values and significant  changes across serial measurements may suggest ACS but many other  chronic and acute conditions are known to elevate hsTnI results.  Refer to the "Links" section for chest pain algorithms and additional  guidance. Performed at [facility], [place] [address] [place], [place] [address]   Brain natriuretic peptide     Status: Abnormal   Collection Time: 03/24/22  4:15 AM  Result Value Ref Range   B Natriuretic Peptide 178.8 (H) 0.0 - 100.0 pg/mL    Comment: Performed at [facility] [address], [place], [place] [address]  CBG monitoring, ED     Status: None   Collection Time: 03/24/22  4:32 AM  Result Value Ref Range   Glucose-Capillary 99 70 - 99  mg/dL    Comment: Glucose reference range applies only to samples taken after fasting for at least 8 hours.  Prepare RBC (crossmatch)     Status: None   Collection Time:  03/24/22  4:38 AM  Result Value Ref Range   Order Confirmation      ORDER PROCESSED BY BLOOD BANK Performed at [facility], [place] [address], [place], [place] [address]   POC occult blood, ED     Status: Abnormal   Collection Time: 03/24/22  4:48 AM  Result Value Ref Range   Fecal Occult Bld POSITIVE (A) NEGATIVE  Type and screen     Status: None (Preliminary result)   Collection Time: 03/24/22  5:00 AM  Result Value Ref Range   ABO/RH(D) O POS    Antibody Screen NEG    Sample Expiration 03/27/2022,2359    Unit Number [id number]    Blood Component Type RBC LR PHER2    Unit division 00    Status of Unit ISSUED    Transfusion Status OK TO TRANSFUSE    Crossmatch Result COMPATIBLE    Unit tag comment VERBAL ORDERS PER DR Hollister    Unit Number [id number]    Blood Component Type RED CELLS,LR    Unit division 00    Status of Unit ISSUED    Transfusion Status OK TO TRANSFUSE    Crossmatch Result Compatible    Unit Number [id number]    Blood Component Type RED CELLS,LR    Unit division 00    Status of Unit ISSUED    Transfusion Status OK TO TRANSFUSE    Crossmatch Result      Compatible Performed at [facility], [place] [address] [place], [place] [address]    Unit Number [id number]    Blood Component Type RED CELLS,LR    Unit division 00    Status of Unit ISSUED    Transfusion Status OK TO TRANSFUSE    Crossmatch Result Compatible   Troponin I (High Sensitivity)     Status: Abnormal   Collection Time: 03/24/22  6:31 AM  Result Value Ref Range   Troponin I (High Sensitivity) 3,894 (HH) <18 ng/L    Comment: CRITICAL VALUE NOTED. VALUE IS CONSISTENT WITH PREVIOUSLY REPORTED/CALLED VALUE (NOTE) Elevated high sensitivity troponin I (hsTnI) values and significant  changes across serial  measurements may suggest ACS but many other  chronic and acute conditions are known to elevate hsTnI results.  Refer to the "Links" section for chest pain algorithms and additional  guidance. Performed at [facility], [place] [address], [place], [place] [address]   Provider-confirm verbal Blood Bank order - RBC; 1 Unit; Order taken: 03/24/2022; 4:57 AM; Emergency Release, STAT One unit of uncrossmatched emergency release O pos red blood cell was removed from the ER refrigerator per Dr Evelina Dun order.  Blood ...     Status: None   Collection Time: 03/24/22  7:46 AM  Result Value Ref Range   Blood product order confirm      MD AUTHORIZATION REQUESTED Performed at [facility] [address], [place], [place] [address]   CBC     Status: Abnormal   Collection Time: 03/24/22  2:10 PM  Result Value Ref Range   WBC 6.7 4.0 - 10.5 K/uL   RBC 3.03 (L) 4.22 - 5.81 MIL/uL   Hemoglobin 8.1 (L) 13.0 - 17.0 g/dL    Comment: REPEATED TO VERIFY POST TRANSFUSION SPECIMEN    HCT 26.5 (L) 39.0 - 52.0 %   MCV 87.5 80.0 - 100.0 fL   MCH 26.7 26.0 - 34.0 pg   MCHC 30.6 30.0 - 36.0 g/dL   RDW 16.3 (H)  11.5 - 15.5 %   Platelets 226 150 - 400 K/uL   nRBC 0.3 (H) 0.0 - 0.2 %    Comment: Performed at [facility], [place] [address], [place], [place] [address]  MRSA Next Gen by PCR, Nasal     Status: None   Collection Time: 03/24/22  5:31 PM   Specimen: Nasal Mucosa; Nasal Swab  Result Value Ref Range   MRSA by PCR Next Gen NOT DETECTED NOT DETECTED    Comment: (NOTE) The GeneXpert MRSA Assay (FDA approved for NASAL specimens only), is one component of a comprehensive MRSA colonization surveillance program. It is not intended to diagnose MRSA infection nor to guide or monitor treatment for MRSA infections. Test performance is not FDA approved in patients less than 2 years old. Performed at [facility], [place] [address] [place], [place] [address]   Glucose, capillary     Status:  Abnormal   Collection Time: 03/24/22  7:40 PM  Result Value Ref Range   Glucose-Capillary 151 (H) 70 - 99 mg/dL    Comment: Glucose reference range applies only to samples taken after fasting for at least 8 hours.  Hemoglobin and hematocrit, blood     Status: Abnormal   Collection Time: 03/24/22  9:39 PM  Result Value Ref Range   Hemoglobin 8.2 (L) 13.0 - 17.0 g/dL   HCT 26.2 (L) 39.0 - 52.0 %    Comment: Performed at [facility] [address] [place], [place] [address]  Glucose, capillary     Status: Abnormal   Collection Time: 03/25/22  1:06 AM  Result Value Ref Range   Glucose-Capillary 171 (H) 70 - 99 mg/dL    Comment: Glucose reference range applies only to samples taken after fasting for at least 8 hours.  Glucose, capillary     Status: Abnormal   Collection Time: 03/25/22  4:09 AM  Result Value Ref Range   Glucose-Capillary 146 (H) 70 - 99 mg/dL    Comment: Glucose reference range applies only to samples taken after fasting for at least 8 hours.  CBC     Status: Abnormal   Collection Time: 03/25/22  5:17 AM  Result Value Ref Range   WBC 6.3 4.0 - 10.5 K/uL   RBC 2.53 (L) 4.22 - 5.81 MIL/uL   Hemoglobin 7.3 (L) 13.0 - 17.0 g/dL   HCT 21.5 (L) 39.0 - 52.0 %   MCV 85.0 80.0 - 100.0 fL   MCH 28.9 26.0 - 34.0 pg   MCHC 34.0 30.0 - 36.0 g/dL   RDW 16.3 (H) 11.5 - 15.5 %   Platelets 209 150 - 400 K/uL   nRBC 0.3 (H) 0.0 - 0.2 %    Comment: Performed at [facility] [address] [place], [place] [address]  Basic metabolic panel     Status: Abnormal   Collection Time: 03/25/22  5:17 AM  Result Value Ref Range   Sodium 131 (L) 135 - 145 mmol/L   Potassium 3.8 3.5 - 5.1 mmol/L   Chloride 100 98 - 111 mmol/L   CO2 18 (L) 22 - 32 mmol/L   Glucose, Bld 142 (H) 70 - 99 mg/dL    Comment: Glucose reference range applies only to samples taken after fasting for at least 8 hours.   BUN 31 (H) 8 - 23 mg/dL   Creatinine, Ser 1.82 (H) 0.61 - 1.24 mg/dL   Calcium 7.7  (L) 8.9 - 10.3 mg/dL  GFR, Estimated 38 (L) >60 mL/min    Comment: (NOTE) Calculated using the CKD-EPI Creatinine Equation (2021)    Anion gap 13 5 - 15    Comment: Performed at [facility], [place] [address] [place], [place] [address]  Magnesium     Status: None   Collection Time: 03/25/22  5:17 AM  Result Value Ref Range   Magnesium 2.1 1.7 - 2.4 mg/dL    Comment: Performed at [facility] [address] [place], [place] [address]  Phosphorus     Status: Abnormal   Collection Time: 03/25/22  5:17 AM  Result Value Ref Range   Phosphorus 5.3 (H) 2.5 - 4.6 mg/dL    Comment: Performed at [facility] [address] [place], [place] [address]  Triglycerides     Status: Abnormal   Collection Time: 03/25/22  5:17 AM  Result Value Ref Range   Triglycerides 823 (H) <150 mg/dL    Comment: Performed at [facility] [address], [place], [place] [address]  Hemoglobin A1c     Status: Abnormal   Collection Time: 03/25/22  9:48 AM  Result Value Ref Range   Hgb A1c MFr Bld 4.3 (L) 4.8 - 5.6 %    Comment: (NOTE) Pre diabetes:          5.7%-6.4%  Diabetes:              >6.4%  Glycemic control for   <7.0% adults with diabetes    Mean Plasma Glucose 76.71 mg/dL    Comment: Performed at [facility] [address], [place],  [address]    ECG   Sinus tachycardia at 137 (9/21) - Personally Reviewed  Telemetry   Sinus rhythm- Personally Reviewed  Radiology    CARDIAC CATHETERIZATION  Result Date: 03/25/2022   Dist LAD lesion is 10% stenosed.   1st Mrg lesion is 30% stenosed. 1.  Diffuse mild to moderate obstructive disease with patent mid LAD stent. 2.  Elevated LVEDP of 25 mmHg. Commendation: Continued medical therapy.   DG CHEST PORT 1 VIEW  Result Date: 03/24/2022 CLINICAL DATA:  Shortness of breath post endoscopy EXAM: PORTABLE CHEST 1 VIEW COMPARISON:  Previous studies including the examination done earlier today FINDINGS: Tip of  endotracheal tube is 3.8 cm above the carina. There is almost complete opacification of left hemithorax. Central pulmonary vessels are prominent in the right lung. Increased interstitial markings are seen in right mid and right lower lung fields. IMPRESSION: There is interval almost complete opacification of left hemithorax suggesting atelectasis/pneumonia or aspiration. Central pulmonary vessels in right lung appear prominent, possibly suggesting CHF. Increased interstitial markings in right parahilar region and right lower lung field may suggest interstitial edema or pneumonia. These results will be called to the ordering clinician or representative by the Radiologist Assistant, and communication documented in the PACS or [employer]. Electronically Signed   By: Elmer Picker M.D.   On: 03/24/2022 16:58   CT Head Wo Contrast  Result Date: 03/24/2022 CLINICAL DATA:  Head trauma with ataxia EXAM: CT HEAD WITHOUT CONTRAST CT CERVICAL SPINE WITHOUT CONTRAST TECHNIQUE: Multidetector CT imaging of the head and cervical spine was performed following the standard protocol without intravenous contrast. Multiplanar CT image reconstructions of the cervical spine were also generated. RADIATION DOSE REDUCTION: This exam was performed according to the departmental dose-optimization program which includes automated exposure control, adjustment of the mA and/or kV according to patient size and/or  use of iterative reconstruction technique. COMPARISON:  None Available. FINDINGS: CT HEAD FINDINGS Brain: No evidence of acute infarction, hemorrhage, hydrocephalus, extra-axial collection or mass lesion/mass effect. Generalized cerebral volume loss and patchy chronic small vessel ischemia in the hemispheric white matter. Vascular: Atheromatous calcification Skull: No acute fracture Sinuses/Orbits: No evidence of injury. Right enucleation with prosthesis. Left cataract resection. CT CERVICAL SPINE FINDINGS Alignment:  Normal Skull base and vertebrae: Chronic marginated lucency through the right C1 lateral mass at the atlantooccipital joint. No acute fracture or aggressive bone lesion. Soft tissues and spinal canal: No prevertebral fluid or swelling. No visible canal hematoma. Disc levels: Multilevel spondylitic spurring. Milder facet spurring with C2-3 ankylosis. Upper chest: No visible injury IMPRESSION: No evidence of acute intracranial or cervical spine injury. Electronically Signed   By: Jorje Guild M.D.   On: 03/24/2022 04:49   CT Cervical Spine Wo Contrast  Result Date: 03/24/2022 CLINICAL DATA:  Head trauma with ataxia EXAM: CT HEAD WITHOUT CONTRAST CT CERVICAL SPINE WITHOUT CONTRAST TECHNIQUE: Multidetector CT imaging of the head and cervical spine was performed following the standard protocol without intravenous contrast. Multiplanar CT image reconstructions of the cervical spine were also generated. RADIATION DOSE REDUCTION: This exam was performed according to the departmental dose-optimization program which includes automated exposure control, adjustment of the mA and/or kV according to patient size and/or use of iterative reconstruction technique. COMPARISON:  None Available. FINDINGS: CT HEAD FINDINGS Brain: No evidence of acute infarction, hemorrhage, hydrocephalus, extra-axial collection or mass lesion/mass effect. Generalized cerebral volume loss and patchy chronic small vessel ischemia in the hemispheric white matter. Vascular: Atheromatous calcification Skull: No acute fracture Sinuses/Orbits: No evidence of injury. Right enucleation with prosthesis. Left cataract resection. CT CERVICAL SPINE FINDINGS Alignment: Normal Skull base and vertebrae: Chronic marginated lucency through the right C1 lateral mass at the atlantooccipital joint. No acute fracture or aggressive bone lesion. Soft tissues and spinal canal: No prevertebral fluid or swelling. No visible canal hematoma. Disc levels: Multilevel  spondylitic spurring. Milder facet spurring with C2-3 ankylosis. Upper chest: No visible injury IMPRESSION: No evidence of acute intracranial or cervical spine injury. Electronically Signed   By: Jorje Guild M.D.   On: 03/24/2022 04:49   DG Elbow 2 Views Right  Result Date: 03/24/2022 CLINICAL DATA:  Level 2 trauma, fall on blood thinners EXAM: RIGHT ELBOW - 2 VIEW COMPARISON:  None Available. FINDINGS: There is no evidence of fracture, dislocation, or joint effusion. There is no evidence of arthropathy or other focal bone abnormality. Soft tissues are unremarkable. IMPRESSION: Negative. Electronically Signed   By: Jorje Guild M.D.   On: 03/24/2022 04:44   DG Chest Portable 1 View  Result Date: 03/24/2022 CLINICAL DATA:  Level 2 fall on blood thinners EXAM: PORTABLE CHEST 1 VIEW COMPARISON:  10/12/2020 FINDINGS: Hyperinflation. There is no edema, consolidation, effusion, or pneumothorax. Normal heart size when accounting for mediastinal fat. Stable mediastinal contours. Artifact from EKG leads. IMPRESSION: 1. No acute finding. 2. Hyperinflation. Electronically Signed   By: Jorje Guild M.D.   On: 03/24/2022 04:44    Cardiac Studies   See above  Assessment   Principal Problem:   GI bleed Active Problems:   History of pulmonary embolism   Ventricular fibrillation (HCC)   GAVE (gastric antral vascular ectasia)   Acute systolic heart failure (HCC)   Plan   Demand ischemia Troponin elevation in the setting of severe anemia - suspect global subendocardial ischemia. Coronaries are patent. Continue medical therapy for CAD.  Avoid aspirin if Xarelto resumed at some point. Acute systolic congestive heart failure  Mildly reduced LVEF post-arrest. Will need further lasix in conjunction with blood products. VF arrest Secondary to profound anemia - coronaries non-obstructive. No recurrence. Severe iron deficiency anemia Remains anemic, but improved. Suspect he will need further  transfusion and lasix with this. Probable colonoscopy as well. History of recurrent DVT/PE Was on Xarelto 20 mg daily - would consider lower dose 10 mg daily preventative going forward without aspirin.  Time Spent Directly with Patient:  I have spent a total of 35 minutes with the patient reviewing hospital notes, telemetry, EKGs, labs and examining the patient as well as establishing an assessment and plan that was discussed personally with the patient.  > 50% of time was spent in direct patient care.  Length of Stay:  LOS: 1 day   Pixie Casino, MD, [facility], [place] Director of the Advanced Lipid Disorders &  Cardiovascular Risk Reduction Clinic Diplomate of the American Board of Clinical Lipidology Attending Cardiologist  Direct Dial: [phone]  Fax: [phone]  Website:  [email or web address] Aaliya Maultsby 03/25/2022, 10:34 AM

## 2022-03-25 NOTE — Telephone Encounter (Signed)
Patient already has an OV on 04/21/22 at 3:00 pm with Dr. Henrene Pastor. Will route to Dr. Tarri Glenn for clarification.

## 2022-03-25 NOTE — Progress Notes (Signed)
[facility] Progress Note Patient Name: GRAIDEN HENES DOB: Jan 29, 1946 MRN: [id number]   Date of Service  03/25/2022  HPI/Events of Note  Patient requesting a sleep aid.  eICU Interventions  Melatonin 3 mg via OG tube ordered.        Kerry Kass Gopal Malter 03/25/2022, 12:23 AM

## 2022-03-25 NOTE — Progress Notes (Signed)
[facility] Progress Note  CC:  Anemia, + FOBT  Subjective: No nausea or vomiting.  He complains of feeling abdominal tightness but no abdominal pain.  He received Maalox/Mylanta with mild relief.  No nausea or vomiting.  He is passing gas per the rectum but no BM today.  He is getting ready to transfer to [place].  Wife is at his side.  Objective:  Vital signs in last 24 hours: Temp:  [97.6 F (36.4 C)-99 F (37.2 C)] 98 F (36.7 C) (09/22 1100) Pulse Rate:  [53-102] 86 (09/22 1400) Resp:  [9-30] 16 (09/22 1400) BP: (81-167)/(40-86) 167/62 (09/22 1400) SpO2:  [93 %-100 %] 98 % (09/22 1400) FiO2 (%):  [40 %-100 %] 40 % (09/22 0740) Weight:  [539 kg-114.3 kg] 114 kg (09/22 0413) Last BM Date :  (PTA)  General: 76 year old male in no acute distress. Eyes: Right eye prosthesis. Heart: Regular rate and rhythm, no murmurs. Pulm: Breath sounds clear. Abdomen: Gaseous distention, nontender.  Positive bowel sounds to all 4 quadrants.  Protruding umbilical hernia. Extremities: Bilateral lower extremities with 2+ pitting edema. Neurologic:  Alert and  oriented x 4. Grossly normal neurologically. Psych:  Alert and cooperative. Normal mood and affect.  Intake/Output from previous day: 09/21 0701 - 09/22 0700 In: 1902.2 [I.V.:564.7; Blood:1337.5] Out: 7673 [Urine:1735] Intake/Output this shift: Total I/O In: 119.2 [I.V.:119.2] Out: 425 [Urine:425]  Lab Results: Recent Labs    03/24/22 0415 03/24/22 1410 03/24/22 2139 03/25/22 0517  WBC 5.9 6.7  --  6.3  HGB 4.8* 8.1* 8.2* 7.3*  HCT 16.8* 26.5* 26.2* 21.5*  PLT 219 226  --  209   BMET Recent Labs    03/24/22 0415 03/25/22 0517  NA 131* 131*  K 4.0 3.8  CL 103 100  CO2 19* 18*  GLUCOSE 122* 142*  BUN 28* 31*  CREATININE 1.91* 1.82*  CALCIUM 8.1* 7.7*   LFT Recent Labs    03/24/22 0415  PROT 5.5*  ALBUMIN 3.1*  AST 31  ALT 22  ALKPHOS 71  BILITOT 0.2*   PT/INR No results for input(s):  "LABPROT", "INR" in the last 72 hours. Hepatitis Panel No results for input(s): "HEPBSAG", "HCVAB", "HEPAIGM", "HEPBIGM" in the last 72 hours.  CARDIAC CATHETERIZATION  Result Date: 03/25/2022   Dist LAD lesion is 10% stenosed.   1st Mrg lesion is 30% stenosed. 1.  Diffuse mild to moderate obstructive disease with patent mid LAD stent. 2.  Elevated LVEDP of 25 mmHg. Commendation: Continued medical therapy.   DG CHEST PORT 1 VIEW  Result Date: 03/24/2022 CLINICAL DATA:  Shortness of breath post endoscopy EXAM: PORTABLE CHEST 1 VIEW COMPARISON:  Previous studies including the examination done earlier today FINDINGS: Tip of endotracheal tube is 3.8 cm above the carina. There is almost complete opacification of left hemithorax. Central pulmonary vessels are prominent in the right lung. Increased interstitial markings are seen in right mid and right lower lung fields. IMPRESSION: There is interval almost complete opacification of left hemithorax suggesting atelectasis/pneumonia or aspiration. Central pulmonary vessels in right lung appear prominent, possibly suggesting CHF. Increased interstitial markings in right parahilar region and right lower lung field may suggest interstitial edema or pneumonia. These results will be called to the ordering clinician or representative by the Radiologist Assistant, and communication documented in the PACS or [employer]. Electronically Signed   By: Elmer Picker M.D.   On: 03/24/2022 16:58   CT Head Wo Contrast  Result Date: 03/24/2022 CLINICAL  DATA:  Head trauma with ataxia EXAM: CT HEAD WITHOUT CONTRAST CT CERVICAL SPINE WITHOUT CONTRAST TECHNIQUE: Multidetector CT imaging of the head and cervical spine was performed following the standard protocol without intravenous contrast. Multiplanar CT image reconstructions of the cervical spine were also generated. RADIATION DOSE REDUCTION: This exam was performed according to the departmental dose-optimization  program which includes automated exposure control, adjustment of the mA and/or kV according to patient size and/or use of iterative reconstruction technique. COMPARISON:  None Available. FINDINGS: CT HEAD FINDINGS Brain: No evidence of acute infarction, hemorrhage, hydrocephalus, extra-axial collection or mass lesion/mass effect. Generalized cerebral volume loss and patchy chronic small vessel ischemia in the hemispheric white matter. Vascular: Atheromatous calcification Skull: No acute fracture Sinuses/Orbits: No evidence of injury. Right enucleation with prosthesis. Left cataract resection. CT CERVICAL SPINE FINDINGS Alignment: Normal Skull base and vertebrae: Chronic marginated lucency through the right C1 lateral mass at the atlantooccipital joint. No acute fracture or aggressive bone lesion. Soft tissues and spinal canal: No prevertebral fluid or swelling. No visible canal hematoma. Disc levels: Multilevel spondylitic spurring. Milder facet spurring with C2-3 ankylosis. Upper chest: No visible injury IMPRESSION: No evidence of acute intracranial or cervical spine injury. Electronically Signed   By: Jorje Guild M.D.   On: 03/24/2022 04:49   CT Cervical Spine Wo Contrast  Result Date: 03/24/2022 CLINICAL DATA:  Head trauma with ataxia EXAM: CT HEAD WITHOUT CONTRAST CT CERVICAL SPINE WITHOUT CONTRAST TECHNIQUE: Multidetector CT imaging of the head and cervical spine was performed following the standard protocol without intravenous contrast. Multiplanar CT image reconstructions of the cervical spine were also generated. RADIATION DOSE REDUCTION: This exam was performed according to the departmental dose-optimization program which includes automated exposure control, adjustment of the mA and/or kV according to patient size and/or use of iterative reconstruction technique. COMPARISON:  None Available. FINDINGS: CT HEAD FINDINGS Brain: No evidence of acute infarction, hemorrhage, hydrocephalus, extra-axial  collection or mass lesion/mass effect. Generalized cerebral volume loss and patchy chronic small vessel ischemia in the hemispheric white matter. Vascular: Atheromatous calcification Skull: No acute fracture Sinuses/Orbits: No evidence of injury. Right enucleation with prosthesis. Left cataract resection. CT CERVICAL SPINE FINDINGS Alignment: Normal Skull base and vertebrae: Chronic marginated lucency through the right C1 lateral mass at the atlantooccipital joint. No acute fracture or aggressive bone lesion. Soft tissues and spinal canal: No prevertebral fluid or swelling. No visible canal hematoma. Disc levels: Multilevel spondylitic spurring. Milder facet spurring with C2-3 ankylosis. Upper chest: No visible injury IMPRESSION: No evidence of acute intracranial or cervical spine injury. Electronically Signed   By: Jorje Guild M.D.   On: 03/24/2022 04:49   DG Elbow 2 Views Right  Result Date: 03/24/2022 CLINICAL DATA:  Level 2 trauma, fall on blood thinners EXAM: RIGHT ELBOW - 2 VIEW COMPARISON:  None Available. FINDINGS: There is no evidence of fracture, dislocation, or joint effusion. There is no evidence of arthropathy or other focal bone abnormality. Soft tissues are unremarkable. IMPRESSION: Negative. Electronically Signed   By: Jorje Guild M.D.   On: 03/24/2022 04:44   DG Chest Portable 1 View  Result Date: 03/24/2022 CLINICAL DATA:  Level 2 fall on blood thinners EXAM: PORTABLE CHEST 1 VIEW COMPARISON:  10/12/2020 FINDINGS: Hyperinflation. There is no edema, consolidation, effusion, or pneumothorax. Normal heart size when accounting for mediastinal fat. Stable mediastinal contours. Artifact from EKG leads. IMPRESSION: 1. No acute finding. 2. Hyperinflation. Electronically Signed   By: Jorje Guild M.D.   On: 03/24/2022  04:8    Assessment / Plan:  76 year old male with a history of chronic IDA who developed dizziness, SOB x one week who fell at home 03/23/2022 resulting in a head  injury.  Intermittent melenic stools.  Labs in the ED showed profound anemia. Hg 4.8. FOBT +.  Transfused 3 units of PRBCs 9/21 -> Hg -> 8.2 -> today Hg 7.3. Repeat H/H ordered at 14:20, not yet done. S/P EGD 9/21 showed GAVE treated with APC and hemostatic spray. GAVE is the likely cause of his chronic GI blood loss anemia.  No further melena post EGD.  Hemodynamically stable. -Continue PPI '40mg'$  po QD indefinitely  -Eventual repeat EGD as an outpatient for retreatment recommended when able to hold antiplatelet and anticoagulant therapy for the procedures. -Await H/H result  -Recommend transfusing for hemoglobin less than 8 -Await further recommendations per Dr. Tarri Glenn  Abdominal distention -Abdominal x-ray in a.m. -MiraLAX nightly as needed   S/P V. Fib arrest, defib x 1 with ROSC.  EKG with ischemic changes.  Troponin 5,431. Received ASA '325mg'$  po x 1.   History of CAD s/p DES. S/P cardiac catheterization today which showed diffuse mild to moderate obstructive disease with patent mid LAD stent.     Acute heart failure    Recurrent PE/DVTs on Xarelto.  Last dose of Xarelto was taken around 6 PM 03/23/2022.   AKI, Cr 1.91 -> 1.82  Hyponatremia    Principal Problem:   GI bleed Active Problems:   History of pulmonary embolism   Ventricular fibrillation (HCC)   GAVE (gastric antral vascular ectasia)   Acute systolic heart failure ([place])     LOS: 1 day   Noralyn Pick  03/25/2022, 2:58 PM

## 2022-03-25 NOTE — Interval H&P Note (Signed)
History and Physical Interval Note:  03/25/2022 6:58 AM  [name]  has presented today for surgery, with the diagnosis of Chest pain.  The various methods of treatment have been discussed with the patient and family. After consideration of risks, benefits and other options for treatment, the patient has consented to  Procedure(s): LEFT HEART CATH AND CORONARY ANGIOGRAPHY (N/A) as a surgical intervention.  The patient's history has been reviewed, patient examined, no change in status, stable for surgery.  I have reviewed the patient's chart and labs.  Questions were answered to the patient's satisfaction.     Early Osmond

## 2022-03-25 NOTE — Anesthesia Postprocedure Evaluation (Signed)
Anesthesia Post Note  Patient: Aaron [name]  Procedure(s) Performed: ESOPHAGOGASTRODUODENOSCOPY (EGD) WITH PROPOFOL TRANSESOPHAGEAL ECHOCARDIOGRAM (TEE) HOT HEMOSTASIS (ARGON PLASMA COAGULATION/BICAP) HEMOSTASIS CONTROL BUBBLE STUDY     Patient location during evaluation: ICU Anesthesia Type: General Level of consciousness: sedated Pain management: pain level controlled Vital Signs Assessment: post-procedure vital signs reviewed and stable Respiratory status: patient remains intubated per anesthesia plan Cardiovascular status: stable Postop Assessment: no apparent nausea or vomiting Anesthetic complications: no   No notable events documented.  Last Vitals:  Vitals:   03/25/22 0740 03/25/22 0752  BP: (!) 97/49   Pulse: 60   Resp: (!) 22   Temp:    SpO2: 98% 100%    Last Pain:  Vitals:   03/25/22 0400  TempSrc: Oral  PainSc:                  Kaleeyah Cuffie

## 2022-03-25 NOTE — Progress Notes (Signed)
Pt transported on the ventilator with RT and Cath lab staff x3 without complication.

## 2022-03-25 NOTE — Progress Notes (Signed)
[facility] Progress Note Patient Name: Aaron [name] DOB: December 29, 1945 MRN: [id number]   Date of Service  03/25/2022  HPI/Events of Note  Pt having pain in the rib area, likely musculoskeletal in nature.  Tylenol has not been helping it.   eICU Interventions  Trial of tramadol PRN.      Intervention Category Intermediate Interventions: Pain - evaluation and management  Elsie Lincoln 03/25/2022, 8:59 PM  5:15 AM Pt requesting for stronger pain medication.   Plan> Dilaudid IV PRN ordered.

## 2022-03-25 NOTE — Procedures (Signed)
Extubation Procedure Note  Patient Details:   Name: Aaron [name] DOB: 05-06-1946 MRN: [id number]   Airway Documentation:    Vent end date: 03/25/22 Vent end time: 0925   Evaluation  O2 sats: stable throughout Complications: No apparent complications Patient did not tolerate procedure well. Bilateral Breath Sounds: Diminished   Yes  Cordella Register 03/25/2022, 9:32 AM

## 2022-03-25 NOTE — Telephone Encounter (Signed)
-----   Message from [place], MD sent at 03/25/2022  3:44 PM EDT ----- Office follow-up with me or an APP in 4 weeks.  Thanks.  KLB

## 2022-03-26 ENCOUNTER — Inpatient Hospital Stay (HOSPITAL_COMMUNITY): Payer: Medicare Other

## 2022-03-26 DIAGNOSIS — R7303 Prediabetes: Secondary | ICD-10-CM | POA: Diagnosis present

## 2022-03-26 DIAGNOSIS — I469 Cardiac arrest, cause unspecified: Secondary | ICD-10-CM

## 2022-03-26 DIAGNOSIS — D649 Anemia, unspecified: Secondary | ICD-10-CM

## 2022-03-26 DIAGNOSIS — Z86711 Personal history of pulmonary embolism: Secondary | ICD-10-CM

## 2022-03-26 DIAGNOSIS — I5021 Acute systolic (congestive) heart failure: Secondary | ICD-10-CM | POA: Diagnosis not present

## 2022-03-26 DIAGNOSIS — I4901 Ventricular fibrillation: Secondary | ICD-10-CM | POA: Diagnosis not present

## 2022-03-26 DIAGNOSIS — K31819 Angiodysplasia of stomach and duodenum without bleeding: Secondary | ICD-10-CM | POA: Diagnosis not present

## 2022-03-26 DIAGNOSIS — I5033 Acute on chronic diastolic (congestive) heart failure: Secondary | ICD-10-CM | POA: Diagnosis not present

## 2022-03-26 DIAGNOSIS — E669 Obesity, unspecified: Secondary | ICD-10-CM

## 2022-03-26 DIAGNOSIS — N189 Chronic kidney disease, unspecified: Secondary | ICD-10-CM | POA: Diagnosis present

## 2022-03-26 DIAGNOSIS — N179 Acute kidney failure, unspecified: Secondary | ICD-10-CM | POA: Diagnosis present

## 2022-03-26 DIAGNOSIS — I10 Essential (primary) hypertension: Secondary | ICD-10-CM

## 2022-03-26 LAB — BASIC METABOLIC PANEL
Anion gap: 12 (ref 5–15)
BUN: 35 mg/dL — ABNORMAL HIGH (ref 8–23)
CO2: 22 mmol/L (ref 22–32)
Calcium: 9.1 mg/dL (ref 8.9–10.3)
Chloride: 104 mmol/L (ref 98–111)
Creatinine, Ser: 1.82 mg/dL — ABNORMAL HIGH (ref 0.61–1.24)
GFR, Estimated: 38 mL/min — ABNORMAL LOW (ref >60)
Glucose, Bld: 113 mg/dL — ABNORMAL HIGH (ref 70–99)
Potassium: 3.8 mmol/L (ref 3.5–5.1)
Sodium: 138 mmol/L (ref 135–145)

## 2022-03-26 LAB — CBC
HCT: 27.8 % — ABNORMAL LOW (ref 39.0–52.0)
Hemoglobin: 8.8 g/dL — ABNORMAL LOW (ref 13.0–17.0)
MCH: 26.8 pg (ref 26.0–34.0)
MCHC: 31.7 g/dL (ref 30.0–36.0)
MCV: 84.8 fL (ref 80.0–100.0)
Platelets: 311 10*3/uL (ref 150–400)
RBC: 3.28 MIL/uL — ABNORMAL LOW (ref 4.22–5.81)
RDW: 16.2 % — ABNORMAL HIGH (ref 11.5–15.5)
WBC: 10.9 10*3/uL — ABNORMAL HIGH (ref 4.0–10.5)
nRBC: 0.2 % (ref 0.0–0.2)

## 2022-03-26 LAB — ECHOCARDIOGRAM COMPLETE
AR max vel: 1.88 cm2
AV Area VTI: 1.76 cm2
AV Area mean vel: 1.89 cm2
AV Mean grad: 12 mmHg
AV Peak grad: 24.4 mmHg
Ao pk vel: 2.47 m/s
Area-P 1/2: 3.42 cm2
Calc EF: 56.2 %
Height: 66 in
S' Lateral: 4.15 cm
Single Plane A2C EF: 56.9 %
Single Plane A4C EF: 56.2 %
Weight: 3281.6 oz

## 2022-03-26 LAB — GLUCOSE, CAPILLARY
Glucose-Capillary: 106 mg/dL — ABNORMAL HIGH (ref 70–99)
Glucose-Capillary: 97 mg/dL (ref 70–99)
Glucose-Capillary: 97 mg/dL (ref 70–99)
Glucose-Capillary: 101 mg/dL — ABNORMAL HIGH (ref 70–99)

## 2022-03-26 MED ORDER — HYDROCODONE-ACETAMINOPHEN 5-325 MG PO TABS
1.0000 | ORAL_TABLET | ORAL | Status: DC | PRN
  Administered 2022-03-26 – 2022-04-04 (×35): 1 via ORAL
  Filled 2022-03-26 (×36): qty 1

## 2022-03-26 MED ORDER — HYDROMORPHONE HCL 1 MG/ML IJ SOLN: 0.5000 mg | INTRAMUSCULAR | Status: DC | PRN

## 2022-03-26 MED ORDER — POLYETHYLENE GLYCOL 3350 17 G PO PACK
17.0000 g | PACK | Freq: Two times a day (BID) | ORAL | Status: DC
  Administered 2022-03-26 – 2022-04-01 (×8): 17 g via ORAL
  Filled 2022-03-26 (×11): qty 1

## 2022-03-26 MED ORDER — HYDROMORPHONE HCL 1 MG/ML IJ SOLN: 1.0000 mg | INTRAMUSCULAR | Status: DC | PRN

## 2022-03-26 MED ORDER — HYDROMORPHONE HCL 1 MG/ML IJ SOLN
1.0000 mg | INTRAMUSCULAR | Status: DC | PRN
  Administered 2022-03-31: 1 mg via INTRAVENOUS
  Filled 2022-03-26: qty 1

## 2022-03-26 MED ORDER — HYDROMORPHONE HCL 1 MG/ML IJ SOLN
0.5000 mg | INTRAMUSCULAR | Status: DC | PRN
  Administered 2022-03-26: 0.5 mg via INTRAVENOUS
  Filled 2022-03-26: qty 1

## 2022-03-26 MED ORDER — BISACODYL 5 MG PO TBEC
5.0000 mg | DELAYED_RELEASE_TABLET | Freq: Once | ORAL | Status: AC
  Administered 2022-03-26: 5 mg via ORAL
  Filled 2022-03-26: qty 1

## 2022-03-26 NOTE — Hospital Course (Addendum)
Aaron [name] was admitted to the hospital with the working diagnosis of VF arrest in the setting of severe anemia, acute on chronic blood loss.   76 yo male with the past medical history of coronary artery disease, DVT, PE and iron deficiency. Patient had intermittent weakness and dizziness for at least 2 months, worsening and prompting to come to the hospital. In the Ed had a V fib arrest, patient recovered spontaneous circulation. On his initial physical examination his blood pressure was 105/56, HR 73, RR 17 and 02 saturation 100%, pale, awake and alert, lungs with no wheezing, heart with S1 and S2 present and regular, abdomen with no distention, positive lower extremity edema.   Na 131, K 4,0 Cl 103 bicarbonate 19 glucose 122, bun 28 cr 1,91  BNP 178  High sensitive troponin 5,431 Wbc 5,9 hgb 4,8 plt 219   Patient was admitted to the intensive care unit on invasive mechanical ventilation. Received PRBC transfusions # 3 09/21 EGD with gastric antral vascular ectasia with bleeding, treated with Argon plasma coagulation with success  09/22 coronary angiography with mild to moderate obstructive coronary artery diease. 09/22 patient extubated.   09/23 transfer to [facility].   09/24 patient hemodynamically stable, to high risk to resume full anticoagulation. Plan to resume low dose aspirin and placed IVC filter.  09/25 IR Consulted for IVC filter placement.  09/26 IVC filter placement. Patient will need a lifeVest before discharge.

## 2022-03-26 NOTE — Progress Notes (Addendum)
Progress Note   Patient: Aaron [name]:[id number] DOB: 11-Mar-1946 DOA: 03/24/2022     2 DOS: the patient was seen and examined on 03/26/2022   Brief hospital course: Aaron [name] was admitted to the hospital with the working diagnosis of VF arrest in the setting of severe anemia, acute on chronic blood loss.   76 yo male with the past medical history of coronary artery disease, DVT, PE and iron deficiency. Patient had intermittent weakness and dizziness for at least 2 months, worsening and prompting to come to the hospital. In the Ed had a V fib arrest, patient recovered spontaneous circulation. On his initial physical examination his blood pressure was 105/56, HR 73, RR 17 and 02 saturation 100%, pale, awake and alert, lungs with no wheezing, heart with S1 and S2 present and regular, abdomen with no distention, positive lower extremity edema.   Na 131, K 4,0 Cl 103 bicarbonate 19 glucose 122, bun 28 cr 1,91  BNP 178  High sensitive troponin 5,431 Wbc 5,9 hgb 4,8 plt 219   Patient was admitted to the intensive care unit on invasive mechanical ventilation. Received PRBC transfusions # 3 09/21 EGD with gastric antral vascular ectasia with bleeding, treated with Argon plasma coagulation with success  09/22 coronary angiography with mild to moderate obstructive coronary artery diease. 09/22 patient extubated.   09/23 transfer to [facility].   Assessment and Plan: * Ventricular fibrillation (HCC) Cardiac arrest Vfib, due to severe anemia due to acute blood loss anemia, due to upper GI bleed.   Patient with hemodynamic stability after 3 units PRBC transfusion.   Echocardiogram with preserved LV systolic function with no regional wall motion abnormalities Coronary angiography with no significant coronary artery disease.   GAVE (gastric antral vascular ectasia) Spoke with GI patient is at a very high risk for re bleeding.  GAVE will need subsequent treatments and healing process is prolonged.   Will need frequent cell count monitoring.   Continue with proton pump inhibition and follow up with GI as outpatient.   Acute on chronic diastolic CHF (congestive heart failure) (HCC) Echocardiogram with preserved LV systolic function with EF 55 to 60%, mild LVH, RV with preserved systolic function. No significant valvular disease.   Urine output is 3,557 ml Systolic blood pressure 322 to 130 mmHg.   Continue blood pressure monitoring.  On amlodipine.    History of pulmonary embolism CT chest from 2012 with bilateral pulmonary embolism with significant clot burden, right ventricular strain pattern.   2015 acute occlusive deep vein thrombosis involving peroneal and posterior tibial veins of the left lower extremity. Acute non occlusive deep vein thrombosis noted in the popliteal vein of the left lower extremity.   Considering his persistent iron deficiency anemia, high risk of bleeding and recurrent thrombosis, will consider IVC filer while treatment of GAVE.  Potentially in the future the filter can be removed and patient placed back on anticoagulation.  Currently resuming anticoagulation may be to high risk. Will discuss with patient and his wife.  Will discussed with Dr Gardiner Rhyme from cardiology.     Acute kidney injury superimposed on chronic kidney disease (HCC) CKD stage 2 , hyponatremia   Renal function with serum cr at 1,8 with K at 3,8 and serum bicarbonate at 22. Na 138  Plan to continue close follow up on renal function and electrolytes, avoid hypotension and nephrotoxic medications.   Hypertension Continue blood pressure control with amlodipine,   Class 1 obesity Calculated BMI is 33,1  Subjective: Patient with chest pain, rib cage, worse with movement and deep breathing   Physical Exam: Vitals:   03/26/22 0420 03/26/22 0740 03/26/22 0938 03/26/22 1556  BP: (!) 144/62 (!) 139/55  (!) 129/55  Pulse: 82 85  78  Resp: '20 19  17  '$ Temp: 98.2 F (36.8  C) 98 F (36.7 C) 98.9 F (37.2 C) 98.4 F (36.9 C)  TempSrc: Oral Oral Oral Oral  SpO2: 97% 98%  97%  Weight: 93 kg     Height:       Neurology awake and alert ENT with mild pallor Cardiovascular with S1 and S2 present and rhythmic Respiratory with no rales or wheezing Abdomen with no distention Positive non pitting bilateral lower extremity edema  Data Reviewed:    Family Communication: I spoke with patient's wife at the bedside, we talked in detail about patient's condition, plan of care and prognosis and all questions were addressed.   Disposition: Status is: Inpatient Remains inpatient appropriate because: recovery from cardiac arrest,   Planned Discharge Destination: Home      Author: Tawni Millers, MD 03/26/2022 6:00 PM  For on call review [email or web address].

## 2022-03-26 NOTE — Progress Notes (Addendum)
DAILY PROGRESS NOTE   Patient Name: Aaron [name] Date of Encounter: 03/26/2022 Cardiologist: Peter Martinique, MD  Chief Complaint   No overnight events  Patient Profile   Aaron [name] is a 76 y.o. 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 He is being seen today (03/24/2022) due to ischemic ST changes on ECG and VF arrest in the ED.   Subjective   Hemoglobin 8.8 today, improved from 7.3 yesterday.  Creatinine stable at 1.82.  BP 139/55.  Reports right sided pain.  Objective   Vitals:   03/26/22 0052 03/26/22 0420 03/26/22 0740 03/26/22 0938  BP: (!) 146/68 (!) 144/62 (!) 139/55   Pulse: 89 82 85   Resp: '20 20 19   '$ Temp: 98.2 F (36.8 C) 98.2 F (36.8 C) 98 F (36.7 C) 98.9 F (37.2 C)  TempSrc: Oral Oral Oral Oral  SpO2: 99% 97% 98%   Weight:  93 kg    Height:        Intake/Output Summary (Last 24 hours) at 03/26/2022 1035 Last data filed at 03/26/2022 0331 Gross per 24 hour  Intake 33.39 ml  Output 1575 ml  Net -1541.61 ml   Filed Weights   03/24/22 1506 03/25/22 0413 03/26/22 0420  Weight: 114.3 kg 114 kg 93 kg    Physical Exam   GEN: in no acute distress HEENT: normal Neck: no JVD, carotid bruits, or masses Cardiac: RRR; no murmurs Respiratory:  clear to auscultation bilaterally, normal work of breathing GI: soft, nontender, nondistended, + BS MS: no deformity or atrophy. 2+ edema Skin: warm and dry, no rash Neuro:  Alert and Oriented x 3 Psych: euthymic mood, full affect   Inpatient Medications    Scheduled Meds:  amLODipine  5 mg Oral QHS   atorvastatin  80 mg Oral Daily   clonazePAM  0.5 mg Oral QHS   ezetimibe  10 mg Oral Daily   ferrous sulfate  325 mg Oral Q breakfast   insulin aspart  0-15 Units Subcutaneous TID WC    melatonin  3 mg Oral Once   pantoprazole  40 mg Oral Daily   sodium chloride flush  3 mL Intravenous Q12H   traZODone  50 mg Oral QHS   venlafaxine XR  150 mg Oral Q breakfast    Continuous Infusions:  sodium chloride 10 mL/hr at 03/25/22 1200   sodium chloride      PRN Meds: sodium chloride, sodium chloride, acetaminophen, alum & mag hydroxide-simeth, docusate sodium, HYDROmorphone (DILAUDID) injection, ondansetron (ZOFRAN) IV, polyethylene glycol, sodium chloride flush, traMADol   Labs   Results for orders placed or performed during the hospital encounter of 03/24/22 (from the past 48 hour(s))  CBC     Status: Abnormal   Collection Time: 03/24/22  2:10 PM  Result Value Ref Range   WBC 6.7 4.0 - 10.5 K/uL   RBC 3.03 (L) 4.22 - 5.81 MIL/uL   Hemoglobin 8.1 (L) 13.0 - 17.0 g/dL    Comment: REPEATED TO VERIFY POST TRANSFUSION SPECIMEN    HCT 26.5 (L) 39.0 - 52.0 %   MCV 87.5 80.0 - 100.0 fL   MCH 26.7 26.0 - 34.0 pg   MCHC 30.6 30.0 - 36.0 g/dL   RDW 16.3 (H) 11.5 - 15.5 %   Platelets 226  150 - 400 K/uL   nRBC 0.3 (H) 0.0 - 0.2 %    Comment: Performed at [facility], [place] [address], [place], [place] [address]  MRSA Next Gen by PCR, Nasal     Status: None   Collection Time: 03/24/22  5:31 PM   Specimen: Nasal Mucosa; Nasal Swab  Result Value Ref Range   MRSA by PCR Next Gen NOT DETECTED NOT DETECTED    Comment: (NOTE) The GeneXpert MRSA Assay (FDA approved for NASAL specimens only), is one component of a comprehensive MRSA colonization surveillance program. It is not intended to diagnose MRSA infection nor to guide or monitor treatment for MRSA infections. Test performance is not FDA approved in patients less than 14 years old. Performed at [facility], [place] [address] [place], [place] [address]   Glucose, capillary     Status: Abnormal   Collection Time: 03/24/22  7:40 PM  Result Value Ref Range   Glucose-Capillary 151 (H) 70 - 99 mg/dL    Comment:  Glucose reference range applies only to samples taken after fasting for at least 8 hours.  Hemoglobin and hematocrit, blood     Status: Abnormal   Collection Time: 03/24/22  9:39 PM  Result Value Ref Range   Hemoglobin 8.2 (L) 13.0 - 17.0 g/dL   HCT 26.2 (L) 39.0 - 52.0 %    Comment: Performed at [facility] [address] [place], [place] [address]  Glucose, capillary     Status: Abnormal   Collection Time: 03/25/22  1:06 AM  Result Value Ref Range   Glucose-Capillary 171 (H) 70 - 99 mg/dL    Comment: Glucose reference range applies only to samples taken after fasting for at least 8 hours.  Glucose, capillary     Status: Abnormal   Collection Time: 03/25/22  4:09 AM  Result Value Ref Range   Glucose-Capillary 146 (H) 70 - 99 mg/dL    Comment: Glucose reference range applies only to samples taken after fasting for at least 8 hours.  CBC     Status: Abnormal   Collection Time: 03/25/22  5:17 AM  Result Value Ref Range   WBC 6.3 4.0 - 10.5 K/uL   RBC 2.53 (L) 4.22 - 5.81 MIL/uL   Hemoglobin 7.3 (L) 13.0 - 17.0 g/dL   HCT 21.5 (L) 39.0 - 52.0 %   MCV 85.0 80.0 - 100.0 fL   MCH 28.9 26.0 - 34.0 pg   MCHC 34.0 30.0 - 36.0 g/dL   RDW 16.3 (H) 11.5 - 15.5 %   Platelets 209 150 - 400 K/uL   nRBC 0.3 (H) 0.0 - 0.2 %    Comment: Performed at [facility] [address] [place], [place] [address]  Basic metabolic panel     Status: Abnormal   Collection Time: 03/25/22  5:17 AM  Result Value Ref Range   Sodium 131 (L) 135 - 145 mmol/L   Potassium 3.8 3.5 - 5.1 mmol/L   Chloride 100 98 - 111 mmol/L   CO2 18 (L) 22 - 32 mmol/L   Glucose, Bld 142 (H) 70 - 99 mg/dL    Comment: Glucose reference range applies only to samples taken after fasting for at least 8 hours.   BUN 31 (H) 8 - 23 mg/dL   Creatinine, Ser 1.82 (H) 0.61 - 1.24 mg/dL   Calcium 7.7 (L) 8.9 - 10.3 mg/dL   GFR, Estimated 38 (L) >60 mL/min  Comment: (NOTE) Calculated using the CKD-EPI Creatinine Equation  (2021)    Anion gap 13 5 - 15    Comment: Performed at [facility], [place] [address], [place], [place] [address]  Magnesium     Status: None   Collection Time: 03/25/22  5:17 AM  Result Value Ref Range   Magnesium 2.1 1.7 - 2.4 mg/dL    Comment: Performed at [facility] [address] [place], [place] [address]  Phosphorus     Status: Abnormal   Collection Time: 03/25/22  5:17 AM  Result Value Ref Range   Phosphorus 5.3 (H) 2.5 - 4.6 mg/dL    Comment: Performed at [facility] [address], [place], [place] [address]  Triglycerides     Status: Abnormal   Collection Time: 03/25/22  5:17 AM  Result Value Ref Range   Triglycerides 823 (H) <150 mg/dL    Comment: Performed at [facility] [address], [place], [place] [address]  Hemoglobin A1c     Status: Abnormal   Collection Time: 03/25/22  9:48 AM  Result Value Ref Range   Hgb A1c MFr Bld 4.3 (L) 4.8 - 5.6 %    Comment: (NOTE) Pre diabetes:          5.7%-6.4%  Diabetes:              >6.4%  Glycemic control for   <7.0% adults with diabetes    Mean Plasma Glucose 76.71 mg/dL    Comment: Performed at [facility] [address] [place], [place] [address]  Glucose, capillary     Status: Abnormal   Collection Time: 03/25/22 11:42 AM  Result Value Ref Range   Glucose-Capillary 125 (H) 70 - 99 mg/dL    Comment: Glucose reference range applies only to samples taken after fasting for at least 8 hours.  Glucose, capillary     Status: Abnormal   Collection Time: 03/25/22  4:21 PM  Result Value Ref Range   Glucose-Capillary 123 (H) 70 - 99 mg/dL    Comment: Glucose reference range applies only to samples taken after fasting for at least 8 hours.  CBC     Status: Abnormal   Collection Time: 03/25/22  7:43 PM  Result Value Ref Range   WBC 13.8 (H) 4.0 - 10.5 K/uL   RBC 3.27 (L) 4.22 - 5.81 MIL/uL   Hemoglobin 8.7 (L) 13.0 - 17.0 g/dL   HCT 27.5 (L) 39.0 - 52.0 %   MCV 84.1 80.0 - 100.0 fL    MCH 26.6 26.0 - 34.0 pg   MCHC 31.6 30.0 - 36.0 g/dL   RDW 16.0 (H) 11.5 - 15.5 %   Platelets 314 150 - 400 K/uL   nRBC 0.2 0.0 - 0.2 %    Comment: Performed at [facility] [address] [place], [place] [address]  Glucose, capillary     Status: None   Collection Time: 03/25/22  8:59 PM  Result Value Ref Range   Glucose-Capillary 99 70 - 99 mg/dL    Comment: Glucose reference range applies only to samples taken after fasting for at least 8 hours.  CBC     Status: Abnormal   Collection Time: 03/26/22  3:39 AM  Result Value Ref Range   WBC 10.9 (H) 4.0 - 10.5 K/uL   RBC 3.28 (L) 4.22 - 5.81 MIL/uL   Hemoglobin 8.8 (L) 13.0 - 17.0 g/dL   HCT 27.8 (  L) 39.0 - 52.0 %   MCV 84.8 80.0 - 100.0 fL   MCH 26.8 26.0 - 34.0 pg   MCHC 31.7 30.0 - 36.0 g/dL   RDW 16.2 (H) 11.5 - 15.5 %   Platelets 311 150 - 400 K/uL   nRBC 0.2 0.0 - 0.2 %    Comment: Performed at [facility] [address], [place], [place] [address]  Basic metabolic panel     Status: Abnormal   Collection Time: 03/26/22  3:39 AM  Result Value Ref Range   Sodium 138 135 - 145 mmol/L    Comment: DELTA CHECK NOTED   Potassium 3.8 3.5 - 5.1 mmol/L   Chloride 104 98 - 111 mmol/L   CO2 22 22 - 32 mmol/L   Glucose, Bld 113 (H) 70 - 99 mg/dL    Comment: Glucose reference range applies only to samples taken after fasting for at least 8 hours.   BUN 35 (H) 8 - 23 mg/dL   Creatinine, Ser 1.82 (H) 0.61 - 1.24 mg/dL   Calcium 9.1 8.9 - 10.3 mg/dL   GFR, Estimated 38 (L) >60 mL/min    Comment: (NOTE) Calculated using the CKD-EPI Creatinine Equation (2021)    Anion gap 12 5 - 15    Comment: Performed at [facility] [address] [place], [place] [address]  Glucose, capillary     Status: Abnormal   Collection Time: 03/26/22  7:39 AM  Result Value Ref Range   Glucose-Capillary 106 (H) 70 - 99 mg/dL    Comment: Glucose reference range applies only to samples taken after fasting for at least 8 hours.     ECG   Sinus tachycardia - Personally Reviewed  Telemetry   Sinus rhythm- Personally Reviewed  Radiology    CARDIAC CATHETERIZATION  Result Date: 03/25/2022   Dist LAD lesion is 10% stenosed.   1st Mrg lesion is 30% stenosed. 1.  Diffuse mild to moderate obstructive disease with patent mid LAD stent. 2.  Elevated LVEDP of 25 mmHg. Commendation: Continued medical therapy.   DG CHEST PORT 1 VIEW  Result Date: 03/24/2022 CLINICAL DATA:  Shortness of breath post endoscopy EXAM: PORTABLE CHEST 1 VIEW COMPARISON:  Previous studies including the examination done earlier today FINDINGS: Tip of endotracheal tube is 3.8 cm above the carina. There is almost complete opacification of left hemithorax. Central pulmonary vessels are prominent in the right lung. Increased interstitial markings are seen in right mid and right lower lung fields. IMPRESSION: There is interval almost complete opacification of left hemithorax suggesting atelectasis/pneumonia or aspiration. Central pulmonary vessels in right lung appear prominent, possibly suggesting CHF. Increased interstitial markings in right parahilar region and right lower lung field may suggest interstitial edema or pneumonia. These results will be called to the ordering clinician or representative by the Radiologist Assistant, and communication documented in the PACS or [employer]. Electronically Signed   By: Elmer Picker M.D.   On: 03/24/2022 16:58    Cardiac Studies   See above  Assessment   Principal Problem:   GI bleed Active Problems:   History of pulmonary embolism   Ventricular fibrillation (HCC)   GAVE (gastric antral vascular ectasia)   Acute systolic heart failure [facility])   Plan   76 y.o. 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  VF arrest: Occurred in ED.  Suspect secondary to profound anemia, hemoglobin 4.8 on presentation.  TEE was done during endoscopy which showed mild systolic dysfunction.  LHC 9/22 showed nonobstructive CAD, patent mid LAD stent.  LVEDP elevated at 25 mmHg -Will check TTE  Demand ischemia: Troponin up to 5431.  Suspect demand ischemia in setting of severe anemia.  LHC unremarkable as above  Acute systolic heart failure: Mildly reduced LVEF on TTE postarrest.  LVEDP elevated on cath as above.   -Check TTE  Severe anemia: Hemoglobin 4.8 on presentation.  EGD 9/21 showed gastric antral vascular ectasia with bleeding, successfully treated with APC.  Hemoglobin improved.  GI recommends outpatient colonoscopy  History of recurrent DVT/PE: Was previously on Xarelto 20 mg daily, currently on hold given GI bleed as above.  Will need GI approval before restarting anticoagulation.  Dr Debara Pickett had discussed possibly using low dose xarelto 10 mg daily moving forward  CAD: Status post LAD stent.  LHC 9/22 showed nonobstructive CAD, patent mid LAD stent. -Restart aspirin 81 mg daily as soon as okay per GI standpoint given history of coronary stenting (or could do Xarelto as above) -Continue atorvastatin 80 mg daily  Hypertension: On amlodipine 5 mg daily  AKI: Creatinine 1.9 on admission, was 1.02 08/2021.  Length of Stay:  LOS: 2 days    Donato Heinz 03/26/2022, 10:35 AM

## 2022-03-26 NOTE — Assessment & Plan Note (Signed)
Spoke with GI patient is at a very high risk for re bleeding.  GAVE will need subsequent treatments and healing process is prolonged.  Will need frequent cell count monitoring.   Continue with proton pump inhibition and follow up with GI as outpatient.

## 2022-03-26 NOTE — Assessment & Plan Note (Signed)
Cardiac arrest Vfib, due to severe anemia due to acute blood loss anemia, due to upper GI bleed.   Patient with hemodynamic stability after 3 units PRBC transfusion.  Follow up hgb 8.5 Plan to check cell count in am.   Echocardiogram with preserved LV systolic function with no regional wall motion abnormalities Coronary angiography with no significant coronary artery disease.

## 2022-03-26 NOTE — Evaluation (Signed)
Physical Therapy Evaluation Patient Details Name: Aaron [name] MRN: [id number] DOB: April 13, 1946 Today's Date: 03/26/2022  History of Present Illness  Pt is a 76 y.o. male admitted 03/24/22 with dizziness and fall. Workup for significant anemia, concern for GIB. Brief vfib arrest in ED. S/p upper endoscopy 9/21. S/p LHC 9/22. ETT 9/21-9/22. 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   Clinical Impression  Pt presents with an overall decrease in functional mobility secondary to above. PTA, pt mod indep with intermittent use of rollator, lives with wife; pt reports increased fatigue and weakness over the past few months, which he attributed to anemia. Today, pt moving well with RW at supervision-level; pt endorses fatigue, but denies dizziness with activity. Pt would benefit from continued acute PT services to maximize functional mobility and independence prior to d/c with HHPT services.     HR 76-102, post-mobility BP 136/73, SpO2 95% on RA    Recommendations for follow up therapy are one component of a multi-disciplinary discharge planning process, led by the attending physician.  Recommendations may be updated based on patient status, additional functional criteria and insurance authorization.  Follow Up Recommendations Home health PT      Assistance Recommended at Discharge Intermittent Supervision/Assistance  Patient can return home with the following  A little help with bathing/dressing/bathroom;Assistance with cooking/housework;Assist for transportation    Equipment Recommendations None recommended by PT  Recommendations for Other Services   Mobility Specialist   Functional Status Assessment Patient has had a recent decline in their functional status and demonstrates the ability to make significant improvements in function in a reasonable and predictable amount of time.     Precautions / Restrictions  Precautions Precautions: Fall;Other (comment) Precaution Comments: R-side rib pain from CPR Restrictions Weight Bearing Restrictions: No      Mobility  Bed Mobility Overal bed mobility: Modified Independent             General bed mobility comments: received sitting EOB    Transfers Overall transfer level: Needs assistance Equipment used: Rolling walker (2 wheels) Transfers: Sit to/from Stand, Bed to chair/wheelchair/BSC Sit to Stand: Min guard   Step pivot transfers: Min guard       General transfer comment: pt requesting assist to stand, able to do so from EOB to RW with min guard for balance, pivotal steps from bed<>BSC with RW and min guard; stand from [facility] to RW with supervision    Ambulation/Gait Ambulation/Gait assistance: Min guard, Supervision Gait Distance (Feet): 80 Feet Assistive device: Rolling walker (2 wheels) Gait Pattern/deviations: Step-through pattern, Decreased stride length, Trunk flexed Gait velocity: Decreased     General Gait Details: slow, steady gait with RW and initial min guard for balance, progressing to supervision for safety; pt declined hallway ambulation, opting to walk laps in room, further distance limited by fatigue  Stairs            Wheelchair Mobility    Modified Rankin (Stroke Patients Only)       Balance Overall balance assessment: Needs assistance Sitting-balance support: No upper extremity supported Sitting balance-Leahy Scale: Good     Standing balance support: No upper extremity supported, During functional activity Standing balance-Leahy Scale: Fair Standing balance comment: static and dynamic stability improved with RW                             Pertinent Vitals/Pain Pain  Assessment Pain Assessment: Faces Faces Pain Scale: Hurts little more Pain Location: R-side ribs and shoulder from CPR Pain Descriptors / Indicators: Discomfort Pain Intervention(s): Monitored during session     Home Living Family/patient expects to be discharged to:: Private residence Living Arrangements: Spouse/significant other Available Help at Discharge: Family;Available 24 hours/day Type of Home: House Home Access: Stairs to enter Entrance Stairs-Rails: Right Entrance Stairs-Number of Steps: 3   Home Layout: One level Home Equipment: Conservation officer, nature (2 wheels);Rollator (4 wheels)      Prior Function Prior Level of Function : Independent/Modified Independent             Mobility Comments: Mod indep with intermittent use of rollator ADLs Comments: has been doing bird bathing at sink mod indep due to fatigue/low energy over past few months. typically does majority of household tasks since wife uses rollator     Hand Dominance        Extremity/Trunk Assessment   Upper Extremity Assessment Upper Extremity Assessment: Overall WFL for tasks assessed    Lower Extremity Assessment Lower Extremity Assessment: Generalized weakness       Communication   Communication: No difficulties  Cognition Arousal/Alertness: Awake/alert Behavior During Therapy: WFL for tasks assessed/performed Overall Cognitive Status: Within Functional Limits for tasks assessed                                 General Comments: verbose with speech, self-distracted due to this but responds well to redirection in conversation. "I'm sorry I ramble"        General Comments General comments (skin integrity, edema, etc.): HR 76-102, post-ambulation BP 136/73, SpO2 95% on RA    Exercises     Assessment/Plan    PT Assessment Patient needs continued PT services  PT Problem List Decreased strength;Decreased activity tolerance;Decreased balance;Decreased mobility;Cardiopulmonary status limiting activity       PT Treatment Interventions DME instruction;Gait training;Stair training;Functional mobility training;Therapeutic activities;Therapeutic exercise;Balance training;Patient/family  education    PT Goals (Current goals can be found in the Care Plan section)  Acute Rehab PT Goals Patient Stated Goal: return home, willing to consider HHPT PT Goal Formulation: With patient Time For Goal Achievement: 04/09/22 Potential to Achieve Goals: Good    Frequency Min 3X/week     Co-evaluation               AM-PAC PT "6 Clicks" Mobility  Outcome Measure Help needed turning from your back to your side while in a flat bed without using bedrails?: None Help needed moving from lying on your back to sitting on the side of a flat bed without using bedrails?: A Little Help needed moving to and from a bed to a chair (including a wheelchair)?: A Little Help needed standing up from a chair using your arms (e.g., wheelchair or bedside chair)?: A Little Help needed to walk in hospital room?: A Little Help needed climbing 3-5 steps with a railing? : A Little 6 Click Score: 19    End of Session Equipment Utilized During Treatment: Gait belt Activity Tolerance: Patient tolerated treatment well Patient left: in chair;with call bell/phone within reach Nurse Communication: Mobility status PT Visit Diagnosis: Other abnormalities of gait and mobility (R26.89);Muscle weakness (generalized) (M62.81)    Time: 4098-1191 PT Time Calculation (min) (ACUTE ONLY): 26 min   Charges:   PT Evaluation $PT Eval Moderate Complexity: 1 Mod        Mabeline Caras, PT,  DPT Acute Rehabilitation Services  Personal: [facility] Office: [place] 03/26/2022, 4:15 PM

## 2022-03-26 NOTE — Progress Notes (Signed)
Pt declined CPAP tonight stating he did not tolerate our machine well.

## 2022-03-26 NOTE — Progress Notes (Signed)
  Echocardiogram 2D Echocardiogram has been performed.  Wynelle Link 03/26/2022, 12:43 PM

## 2022-03-26 NOTE — Assessment & Plan Note (Addendum)
Continue blood pressure control with amlodipine, added HCTZ/ irbersartan combination today.

## 2022-03-26 NOTE — Assessment & Plan Note (Signed)
Echocardiogram with preserved LV systolic function with EF 55 to 60%, mild LVH, RV with preserved systolic function. No significant valvular disease.   Urine output is 8,569 ml Systolic blood pressure 437 to 130 mmHg.   Continue blood pressure monitoring.  On amlodipine.

## 2022-03-26 NOTE — Assessment & Plan Note (Signed)
CT chest from 2012 with bilateral pulmonary embolism with significant clot burden, right ventricular strain pattern.   2015 acute occlusive deep vein thrombosis involving peroneal and posterior tibial veins of the left lower extremity. Acute non occlusive deep vein thrombosis noted in the popliteal vein of the left lower extremity.   Considering his persistent iron deficiency anemia, high risk of bleeding and recurrent thrombosis, will consider IVC filer while treatment of GAVE.  Potentially in the future the filter can be removed and patient placed back on anticoagulation.  Currently resuming anticoagulation may be to high risk. Will discuss with patient and his wife.  Will discussed with Dr Gardiner Rhyme from cardiology.

## 2022-03-26 NOTE — Assessment & Plan Note (Signed)
Calculated BMI is 33,1 

## 2022-03-26 NOTE — Assessment & Plan Note (Addendum)
CKD stage 2 , hyponatremia   Serum cr today is 1,0 with K at 4,0 and serum bicarbonate at 23. Plan to continue diuresis with furosemide and empagliflozin

## 2022-03-27 ENCOUNTER — Encounter (HOSPITAL_COMMUNITY): Payer: Self-pay | Admitting: Internal Medicine

## 2022-03-27 DIAGNOSIS — N179 Acute kidney failure, unspecified: Secondary | ICD-10-CM

## 2022-03-27 DIAGNOSIS — K31819 Angiodysplasia of stomach and duodenum without bleeding: Secondary | ICD-10-CM | POA: Diagnosis not present

## 2022-03-27 DIAGNOSIS — N189 Chronic kidney disease, unspecified: Secondary | ICD-10-CM

## 2022-03-27 DIAGNOSIS — I5021 Acute systolic (congestive) heart failure: Secondary | ICD-10-CM

## 2022-03-27 DIAGNOSIS — Z86711 Personal history of pulmonary embolism: Secondary | ICD-10-CM | POA: Diagnosis not present

## 2022-03-27 DIAGNOSIS — I5033 Acute on chronic diastolic (congestive) heart failure: Secondary | ICD-10-CM | POA: Diagnosis not present

## 2022-03-27 DIAGNOSIS — D649 Anemia, unspecified: Secondary | ICD-10-CM | POA: Diagnosis not present

## 2022-03-27 DIAGNOSIS — K31811 Angiodysplasia of stomach and duodenum with bleeding: Secondary | ICD-10-CM

## 2022-03-27 DIAGNOSIS — I4901 Ventricular fibrillation: Secondary | ICD-10-CM | POA: Diagnosis not present

## 2022-03-27 LAB — BASIC METABOLIC PANEL
Anion gap: 10 (ref 5–15)
BUN: 29 mg/dL — ABNORMAL HIGH (ref 8–23)
CO2: 22 mmol/L (ref 22–32)
Calcium: 8.5 mg/dL — ABNORMAL LOW (ref 8.9–10.3)
Chloride: 106 mmol/L (ref 98–111)
Creatinine, Ser: 1.13 mg/dL (ref 0.61–1.24)
GFR, Estimated: >60 mL/min (ref >60)
Glucose, Bld: 101 mg/dL — ABNORMAL HIGH (ref 70–99)
Potassium: 3.9 mmol/L (ref 3.5–5.1)
Sodium: 138 mmol/L (ref 135–145)

## 2022-03-27 LAB — GLUCOSE, CAPILLARY
Glucose-Capillary: 86 mg/dL (ref 70–99)
Glucose-Capillary: 102 mg/dL — ABNORMAL HIGH (ref 70–99)
Glucose-Capillary: 162 mg/dL — ABNORMAL HIGH (ref 70–99)
Glucose-Capillary: 110 mg/dL — ABNORMAL HIGH (ref 70–99)

## 2022-03-27 LAB — HEMOGLOBIN AND HEMATOCRIT, BLOOD
HCT: 27.6 % — ABNORMAL LOW (ref 39.0–52.0)
Hemoglobin: 8.5 g/dL — ABNORMAL LOW (ref 13.0–17.0)

## 2022-03-27 MED ORDER — ASPIRIN 81 MG PO TBEC
81.0000 mg | DELAYED_RELEASE_TABLET | Freq: Every day | ORAL | Status: DC
  Administered 2022-03-27 – 2022-04-04 (×9): 81 mg via ORAL
  Filled 2022-03-27 (×9): qty 1

## 2022-03-27 MED ORDER — EMPAGLIFLOZIN 10 MG PO TABS
10.0000 mg | ORAL_TABLET | Freq: Every day | ORAL | Status: DC
  Administered 2022-03-27 – 2022-04-04 (×9): 10 mg via ORAL
  Filled 2022-03-27 (×9): qty 1

## 2022-03-27 NOTE — Progress Notes (Signed)
Mobility Specialist - Progress Note   03/27/22 1536  Mobility  Activity Refused mobility    Pt sitting EOB eating dinner. Stated he is going to walk around his room when finished eating. Will follow up as schedule allows.   Paulla Dolly Mobility Specialist

## 2022-03-27 NOTE — Progress Notes (Signed)
Progress Note   Patient: Aaron [name] DOB: 01/21/1946 DOA: 03/24/2022     3 DOS: the patient was seen and examined on 03/27/2022   Brief hospital course: Aaron [name] was admitted to the hospital with the working diagnosis of VF arrest in the setting of severe anemia, acute on chronic blood loss.   76 yo male with the past medical history of coronary artery disease, DVT, PE and iron deficiency. Patient had intermittent weakness and dizziness for at least 2 months, worsening and prompting to come to the hospital. In the Ed had a V fib arrest, patient recovered spontaneous circulation. On his initial physical examination his blood pressure was 105/56, HR 73, RR 17 and 02 saturation 100%, pale, awake and alert, lungs with no wheezing, heart with S1 and S2 present and regular, abdomen with no distention, positive lower extremity edema.   Na 131, K 4,0 Cl 103 bicarbonate 19 glucose 122, bun 28 cr 1,91  BNP 178  High sensitive troponin 5,431 Wbc 5,9 hgb 4,8 plt 219   Patient was admitted to the intensive care unit on invasive mechanical ventilation. Received PRBC transfusions # 3 09/21 EGD with gastric antral vascular ectasia with bleeding, treated with Argon plasma coagulation with success  09/22 coronary angiography with mild to moderate obstructive coronary artery diease. 09/22 patient extubated.   09/23 transfer to [facility].   09/24 patient hemodynamically stable, to high risk to resume full anticoagulation. Plan to resume low dose aspirin and placed IVC filter.    Assessment and Plan: * Ventricular fibrillation (HCC) Cardiac arrest Vfib, due to severe anemia due to acute blood loss anemia, due to upper GI bleed.   Patient with hemodynamic stability after 3 units PRBC transfusion.  Follow up hgb 8.5   Echocardiogram with preserved LV systolic function with no regional wall motion abnormalities Coronary angiography with no significant coronary artery disease.   GAVE (gastric  antral vascular ectasia) Spoke with GI patient is at a very high risk for re bleeding.  GAVE will need subsequent treatments and healing process is prolonged.  Will need frequent cell count monitoring.   Continue with proton pump inhibition and follow up with GI as outpatient.   Acute on chronic diastolic CHF (congestive heart failure) (HCC) Echocardiogram with preserved LV systolic function with EF 55 to 60%, mild LVH, RV with preserved systolic function. No significant valvular disease.    Continue with amlodipine for blood pressure control. Patient will benefit from SGLT 2 inh and possible transition to ARB when renal function more stable.   (mild reduced LV systolic function systolic and diastolic heart failure)   History of pulmonary embolism CT chest from 2012 with bilateral pulmonary embolism with significant clot burden, right ventricular strain pattern.   2015 acute occlusive deep vein thrombosis involving peroneal and posterior tibial veins of the left lower extremity. Acute non occlusive deep vein thrombosis noted in the popliteal vein of the left lower extremity.   Considering his persistent iron deficiency anemia, high risk of bleeding and recurrent thrombosis, plan to proceed with IVC filer while treatment of GAVE.  Potentially in the future the filter can be removed and patient placed back on anticoagulation.  Currently resuming anticoagulation may be to high risk. Cased discussed with patient and Aaron [name] from cardiology.      Acute kidney injury superimposed on chronic kidney disease (HCC) CKD stage 2 , hyponatremia   Continue to improve renal function with serum cr at 1.13 with K at 3,9  and serum bicarbonate at 22. Plan to continue close follow up renal function and electrolytes Will start patient on SGLT 2 inh and will benefit from ARB.   Hypertension Continue blood pressure control with amlodipine, if renal function continue to improve will consider  transition to ARB.   Pre-diabetes Continue glucose cover and monitoring with insulin sliding scale.  Fasting glucose this am is 101 mg/dl.   Class 1 obesity Calculated BMI is 33,1         Subjective: Patient's chest pain has improved with oral analgesics, no dyspnea  Physical Exam: Vitals:   03/26/22 2102 03/27/22 0026 03/27/22 0537 03/27/22 0636  BP: (!) 136/57 (!) 138/92 (!) 150/63   Pulse:      Resp:      Temp: 98 F (36.7 C) 97.6 F (36.4 C) 98.1 F (36.7 C)   TempSrc: Oral Oral Oral   SpO2: 97%     Weight:    112.5 kg  Height:       Neurology awake and alert ENT With mild pallor Cardiovascular with S1 and S2 present and rhythmic with no gallops or murmurs Respiratory with no rales or wheezing' Abdomen not distended Positive lower extremity edema ++  Data Reviewed:    Family Communication: no family at the bedside   Disposition: Status is: Inpatient Remains inpatient appropriate because: VF arrest recovered  Planned Discharge Destination: Home      Author: Tawni Millers, MD 03/27/2022 2:44 PM  For on call review [email or web address].

## 2022-03-27 NOTE — Assessment & Plan Note (Signed)
Continue glucose cover and monitoring with insulin sliding scale.  Fasting glucose this am is 101 mg/dl.

## 2022-03-27 NOTE — Progress Notes (Addendum)
PT is recommending HHPT. Met with pt at bedside. Pt plans to return home with his wife. Pt reports that prior to this admission he was driving. His PCP is Dr. Scarlette Calico. He denies any issues filling his prescriptions. Discussed PT recommendations. Asked pt if he has a preference for a [facility] agency. He reports that he had bilateral knee replacements and he thinks [facility] provided the [facility] therapy, but he is not sure. Provided pt with a Medicare compare list for [facility]. He chose [facility]. Contacted Jason with [facility], but he declined the referral. Contacted Angie with [facility] and she declined the referral. Contacted Carolyn with [facility], but they are not in network with pt's insurance. Informed pt that I was not able to find a [facility] agency. He stated not to worry, that he can do his own exercises and he is determined to get better. He reports that he needs a rollator. He has an old rollator that he purchased at the [employer]. Contacted Jasmine with [facility] for DME referral and she accepted it. Notified Jasmine that pt wants the rollator to be delivered at home.

## 2022-03-27 NOTE — Progress Notes (Addendum)
DAILY PROGRESS NOTE   Patient Name: Aaron [name] Date of Encounter: 03/27/2022 Cardiologist: Peter Martinique, MD  Chief Complaint   No overnight events  Patient Profile   Aaron [name] is a 76 y.o. 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 He is being seen due to VF arrest in the ED.   Subjective   Hemoglobin stable at 8.5.  Creatinine significantly improved (1.82 > 1.13).  BP 150/63.  Denies chest pain or dyspnea   Objective   Vitals:   03/26/22 2102 03/27/22 0026 03/27/22 0537 03/27/22 0636  BP: (!) 136/57 (!) 138/92 (!) 150/63   Pulse:      Resp:      Temp: 98 F (36.7 C) 97.6 F (36.4 C) 98.1 F (36.7 C)   TempSrc: Oral Oral Oral   SpO2: 97%     Weight:    112.5 kg  Height:       No intake or output data in the 24 hours ending 03/27/22 1112  Filed Weights   03/25/22 0413 03/26/22 0420 03/27/22 0636  Weight: 114 kg 93 kg 112.5 kg    Physical Exam   GEN: in no acute distress HEENT: normal Neck: no JVD, carotid bruits, or masses Cardiac: RRR; no murmurs Respiratory:  clear to auscultation bilaterally, normal work of breathing GI: soft, nontender, nondistended, + BS MS: no deformity or atrophy. 1+ edema Skin: warm and dry, no rash Neuro:  Alert and Oriented x 3 Psych: euthymic mood, full affect   Inpatient Medications    Scheduled Meds:  amLODipine  5 mg Oral QHS   aspirin EC  81 mg Oral Daily   atorvastatin  80 mg Oral Daily   clonazePAM  0.5 mg Oral QHS   ezetimibe  10 mg Oral Daily   ferrous sulfate  325 mg Oral Q breakfast   insulin aspart  0-15 Units Subcutaneous TID WC   melatonin  3 mg Oral Once   pantoprazole  40 mg Oral Daily   polyethylene glycol  17 g Oral BID   sodium chloride flush  3 mL Intravenous Q12H   traZODone   50 mg Oral QHS   venlafaxine XR  150 mg Oral Q breakfast    Continuous Infusions:  sodium chloride 10 mL/hr at 03/25/22 1200   sodium chloride      PRN Meds: sodium chloride, sodium chloride, acetaminophen, alum & mag hydroxide-simeth, docusate sodium, HYDROcodone-acetaminophen, HYDROmorphone (DILAUDID) injection, ondansetron (ZOFRAN) IV, sodium chloride flush   Labs   Results for orders placed or performed during the hospital encounter of 03/24/22 (from the past 48 hour(s))  Glucose, capillary     Status: Abnormal   Collection Time: 03/25/22 11:42 AM  Result Value Ref Range   Glucose-Capillary 125 (H) 70 - 99 mg/dL    Comment: Glucose reference range applies only to samples taken after fasting for at least 8 hours.  Glucose, capillary     Status: Abnormal   Collection Time: 03/25/22  4:21 PM  Result Value Ref Range   Glucose-Capillary 123 (H) 70 - 99 mg/dL    Comment: Glucose reference range applies only to samples taken after fasting for at least 8 hours.  CBC     Status: Abnormal   Collection Time: 03/25/22  7:43 PM  Result Value  Ref Range   WBC 13.8 (H) 4.0 - 10.5 K/uL   RBC 3.27 (L) 4.22 - 5.81 MIL/uL   Hemoglobin 8.7 (L) 13.0 - 17.0 g/dL   HCT 27.5 (L) 39.0 - 52.0 %   MCV 84.1 80.0 - 100.0 fL   MCH 26.6 26.0 - 34.0 pg   MCHC 31.6 30.0 - 36.0 g/dL   RDW 16.0 (H) 11.5 - 15.5 %   Platelets 314 150 - 400 K/uL   nRBC 0.2 0.0 - 0.2 %    Comment: Performed at [facility], [place] [address] [place], [place] [address]  Glucose, capillary     Status: None   Collection Time: 03/25/22  8:59 PM  Result Value Ref Range   Glucose-Capillary 99 70 - 99 mg/dL    Comment: Glucose reference range applies only to samples taken after fasting for at least 8 hours.  CBC     Status: Abnormal   Collection Time: 03/26/22  3:39 AM  Result Value Ref Range   WBC 10.9 (H) 4.0 - 10.5 K/uL   RBC 3.28 (L) 4.22 - 5.81 MIL/uL   Hemoglobin 8.8 (L) 13.0 - 17.0 g/dL   HCT 27.8 (L) 39.0 - 52.0 %    MCV 84.8 80.0 - 100.0 fL   MCH 26.8 26.0 - 34.0 pg   MCHC 31.7 30.0 - 36.0 g/dL   RDW 16.2 (H) 11.5 - 15.5 %   Platelets 311 150 - 400 K/uL   nRBC 0.2 0.0 - 0.2 %    Comment: Performed at [facility] [address] [place], [place] [address]  Basic metabolic panel     Status: Abnormal   Collection Time: 03/26/22  3:39 AM  Result Value Ref Range   Sodium 138 135 - 145 mmol/L    Comment: DELTA CHECK NOTED   Potassium 3.8 3.5 - 5.1 mmol/L   Chloride 104 98 - 111 mmol/L   CO2 22 22 - 32 mmol/L   Glucose, Bld 113 (H) 70 - 99 mg/dL    Comment: Glucose reference range applies only to samples taken after fasting for at least 8 hours.   BUN 35 (H) 8 - 23 mg/dL   Creatinine, Ser 1.82 (H) 0.61 - 1.24 mg/dL   Calcium 9.1 8.9 - 10.3 mg/dL   GFR, Estimated 38 (L) >60 mL/min    Comment: (NOTE) Calculated using the CKD-EPI Creatinine Equation (2021)    Anion gap 12 5 - 15    Comment: Performed at [facility] [address], [place], [place] [address]  Glucose, capillary     Status: Abnormal   Collection Time: 03/26/22  7:39 AM  Result Value Ref Range   Glucose-Capillary 106 (H) 70 - 99 mg/dL    Comment: Glucose reference range applies only to samples taken after fasting for at least 8 hours.  Glucose, capillary     Status: None   Collection Time: 03/26/22 11:54 AM  Result Value Ref Range   Glucose-Capillary 97 70 - 99 mg/dL    Comment: Glucose reference range applies only to samples taken after fasting for at least 8 hours.  Glucose, capillary     Status: None   Collection Time: 03/26/22  3:55 PM  Result Value Ref Range   Glucose-Capillary 97 70 - 99 mg/dL    Comment: Glucose reference range applies only to samples taken after fasting for at least 8 hours.  Glucose, capillary     Status: Abnormal   Collection  Time: 03/26/22  8:47 PM  Result Value Ref Range   Glucose-Capillary 101 (H) 70 - 99 mg/dL    Comment: Glucose reference range applies only to samples taken after  fasting for at least 8 hours.  Basic metabolic panel     Status: Abnormal   Collection Time: 03/27/22  4:24 AM  Result Value Ref Range   Sodium 138 135 - 145 mmol/L   Potassium 3.9 3.5 - 5.1 mmol/L   Chloride 106 98 - 111 mmol/L   CO2 22 22 - 32 mmol/L   Glucose, Bld 101 (H) 70 - 99 mg/dL    Comment: Glucose reference range applies only to samples taken after fasting for at least 8 hours.   BUN 29 (H) 8 - 23 mg/dL   Creatinine, Ser 1.13 0.61 - 1.24 mg/dL   Calcium 8.5 (L) 8.9 - 10.3 mg/dL   GFR, Estimated >60 >60 mL/min    Comment: (NOTE) Calculated using the CKD-EPI Creatinine Equation (2021)    Anion gap 10 5 - 15    Comment: Performed at [facility] [address] [place], [place] [address]  Hemoglobin and hematocrit, blood     Status: Abnormal   Collection Time: 03/27/22  4:24 AM  Result Value Ref Range   Hemoglobin 8.5 (L) 13.0 - 17.0 g/dL   HCT 27.6 (L) 39.0 - 52.0 %    Comment: Performed at [facility], [place] [address], [place], [place] [address]  Glucose, capillary     Status: None   Collection Time: 03/27/22  7:48 AM  Result Value Ref Range   Glucose-Capillary 86 70 - 99 mg/dL    Comment: Glucose reference range applies only to samples taken after fasting for at least 8 hours.    ECG   No new ECG - Personally Reviewed  Telemetry   Sinus rhythm- Personally Reviewed  Radiology    ECHOCARDIOGRAM COMPLETE  Result Date: 03/26/2022    ECHOCARDIOGRAM REPORT   Patient Name:   JERONIMO HELLBERG Date of Exam: 03/26/2022 Medical Rec #:  [id number]      Height:       66.0 in Accession #:    [id number]     Weight:       205.1 lb Date of Birth:  21-Mar-1946      BSA:          2.021 m Patient Age:    79 years       BP:           139/55 mmHg Patient Gender: M              HR:           79 bpm. Exam Location:  Inpatient Procedure: 2D Echo, Cardiac Doppler and Color Doppler Indications:    Cardiac arrest  History:        Patient has prior history of Echocardiogram  examinations, most                 recent 03/24/2022. CAD, PE, Arrythmias:Ventricular Fibrillation                 and Cardiac Arrest; Risk Factors:Hypertension, Sleep Apnea and                 Former Smoker.  Sonographer:    Greer Pickerel Referring Phys: [id number] Donato Heinz  Sonographer Comments: Image acquisition challenging due to patient body habitus and Image acquisition challenging due to respiratory motion. Patient  was in too much pain from resucitation performed night before to [facility] well. IMPRESSIONS  1. Study not well visualized to full assess endocardial borders. Left ventricular ejection fraction, by estimation, is 55 to 60%. The left ventricle has normal function. The left ventricle has no regional wall motion abnormalities. There is mild left ventricular hypertrophy. Left ventricular diastolic parameters were normal.  2. Right ventricular systolic function is normal. The right ventricular size is normal. There is normal pulmonary artery systolic pressure.  3. The mitral valve is normal in structure. No evidence of mitral valve regurgitation.  4. The aortic valve was not well visualized. Aortic valve regurgitation is not visualized.  5. The inferior vena cava is normal in size with greater than 50% respiratory variability, suggesting right atrial pressure of 3 mmHg. FINDINGS  Left Ventricle: Study not well visualized to full assess endocardial borders. Left ventricular ejection fraction, by estimation, is 55 to 60%. The left ventricle has normal function. The left ventricle has no regional wall motion abnormalities. The left  ventricular internal cavity size was normal in size. There is mild left ventricular hypertrophy. Left ventricular diastolic parameters were normal. Right Ventricle: The right ventricular size is normal. No increase in right ventricular wall thickness. Right ventricular systolic function is normal. There is normal pulmonary artery systolic pressure. The tricuspid  regurgitant velocity is 2.34 m/s, and  with an assumed right atrial pressure of 3 mmHg, the estimated right ventricular systolic pressure is 60.6 mmHg. Left Atrium: Left atrial size was normal in size. Right Atrium: Right atrial size was normal in size. Pericardium: There is no evidence of pericardial effusion. Mitral Valve: The mitral valve is normal in structure. No evidence of mitral valve regurgitation. Tricuspid Valve: The tricuspid valve is normal in structure. Tricuspid valve regurgitation is not demonstrated. Aortic Valve: The aortic valve was not well visualized. Aortic valve regurgitation is not visualized. Aortic valve mean gradient measures 12.0 mmHg. Aortic valve peak gradient measures 24.4 mmHg. Aortic valve area, by VTI measures 1.76 cm. Pulmonic Valve: The pulmonic valve was not well visualized. Pulmonic valve regurgitation is not visualized. Aorta: The aortic root and ascending aorta are structurally normal, with no evidence of dilitation. Venous: The inferior vena cava is normal in size with greater than 50% respiratory variability, suggesting right atrial pressure of 3 mmHg. IAS/Shunts: The interatrial septum was not well visualized.  LEFT VENTRICLE PLAX 2D LVIDd:         5.60 cm      Diastology LVIDs:         4.15 cm      LV e' medial:  8.70 cm/s LV PW:         1.30 cm      LV e' lateral: 14.10 cm/s LV IVS:        1.00 cm LVOT diam:     2.10 cm LV SV:         83 LV SV Index:   41 LVOT Area:     3.46 cm  LV Volumes (MOD) LV vol d, MOD A2C: 162.0 ml LV vol d, MOD A4C: 182.0 ml LV vol s, MOD A2C: 69.8 ml LV vol s, MOD A4C: 79.8 ml LV SV MOD A2C:     92.2 ml LV SV MOD A4C:     182.0 ml LV SV MOD BP:      99.5 ml RIGHT VENTRICLE RV S prime:     15.70 cm/s TAPSE (M-mode): 2.8 cm LEFT ATRIUM  Index        RIGHT ATRIUM           Index LA diam:      4.00 cm  1.98 cm/m   RA Area:     17.50 cm LA Vol (A2C): 57.0 ml  28.20 ml/m  RA Volume:   45.30 ml  22.41 ml/m LA Vol (A4C): 105.0 ml 51.95  ml/m  AORTIC VALVE AV Area (Vmax):    1.88 cm AV Area (Vmean):   1.89 cm AV Area (VTI):     1.76 cm AV Vmax:           247.00 cm/s AV Vmean:          160.000 cm/s AV VTI:            0.470 m AV Peak Grad:      24.4 mmHg AV Mean Grad:      12.0 mmHg LVOT Vmax:         134.00 cm/s LVOT Vmean:        87.500 cm/s LVOT VTI:          0.239 m LVOT/AV VTI ratio: 0.51  AORTA Ao Root diam: 3.40 cm Ao Asc diam:  3.40 cm MITRAL VALVE               TRICUSPID VALVE MV Area (PHT): 3.42 cm    TR Peak grad:   21.9 mmHg MV Decel Time: 222 msec    TR Vmax:        234.00 cm/s MV A velocity: 80.30 cm/s                            SHUNTS                            Systemic VTI:  0.24 m                            Systemic Diam: 2.10 cm Phineas Inches Electronically signed by Phineas Inches Signature Date/Time: 03/26/2022/1:35:39 PM    Final     Cardiac Studies   See above  Assessment   Principal Problem:   Ventricular fibrillation (HCC) Active Problems:   Hypertension   Class 1 obesity   History of pulmonary embolism   GAVE (gastric antral vascular ectasia)   Acute on chronic diastolic CHF (congestive heart failure) (HCC)   Acute kidney injury superimposed on chronic kidney disease ([place])   Pre-diabetes   Plan   76 y.o. 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  VF arrest: Occurred in ED.  Suspect secondary to profound anemia, hemoglobin 4.8 on presentation.  TEE was done during endoscopy which showed mild systolic dysfunction.  LHC 9/22 showed nonobstructive CAD, patent mid LAD stent.  LVEDP elevated at 25 mmHg.  TTE 03/26/2022 showed EF 55 to 60%, normal RV function, no significant valvular disease, RAP 3 mmHg  Demand ischemia: Troponin up to 5431.  Suspect demand ischemia in setting of severe anemia.   LHC unremarkable as above  Acute systolic heart failure: Mildly reduced LVEF on TTE postarrest.  LVEDP elevated on cath as above.  TTE 03/26/2022 showed EF 55 to 60%, normal  RV function, no significant valvular disease, RAP 3 mmHg  Severe anemia: Hemoglobin 4.8 on presentation.  EGD 9/21 showed gastric antral vascular ectasia with bleeding, successfully treated with APC.  Hemoglobin improved.  GI recommends outpatient colonoscopy  History of recurrent DVT/PE: Was previously on Xarelto 20 mg daily, currently on hold given GI bleed as above.  Dr Cathlean Sauer d/w GI, patient will require more treatments for GAVE and at high risk of rebleeding with restarting Xarelto, would consider IVC filter until GAVE resolved  CAD: Status post LAD stent.  LHC 9/22 showed nonobstructive CAD, patent mid LAD stent. -Recommend restart aspirin 81 mg daily given history of coronary stenting.  Dr Cathlean Sauer d/w GI, OK to restart ASA -Continue atorvastatin 80 mg daily  Hypertension: On amlodipine 5 mg daily  AKI: Creatinine 1.9 on admission, was 1.02 08/2021.  Likely related to VF arrest, has improved to normal  Length of Stay:  LOS: 3 days    Donato Heinz 03/27/2022, 11:12 AM

## 2022-03-27 NOTE — Progress Notes (Signed)
Pt declined CPAP again stating he does not tolerate our machine and did ok last night without it. CPAP pulled from pt room at this time

## 2022-03-28 ENCOUNTER — Telehealth (HOSPITAL_COMMUNITY): Payer: Self-pay | Admitting: Pharmacy Technician

## 2022-03-28 ENCOUNTER — Encounter: Payer: Self-pay | Admitting: Physician Assistant

## 2022-03-28 ENCOUNTER — Other Ambulatory Visit (HOSPITAL_COMMUNITY): Payer: Self-pay

## 2022-03-28 ENCOUNTER — Encounter: Payer: Self-pay | Admitting: Internal Medicine

## 2022-03-28 DIAGNOSIS — F32A Depression, unspecified: Secondary | ICD-10-CM | POA: Diagnosis present

## 2022-03-28 DIAGNOSIS — I5033 Acute on chronic diastolic (congestive) heart failure: Secondary | ICD-10-CM | POA: Diagnosis not present

## 2022-03-28 DIAGNOSIS — Z86711 Personal history of pulmonary embolism: Secondary | ICD-10-CM | POA: Diagnosis not present

## 2022-03-28 DIAGNOSIS — D649 Anemia, unspecified: Secondary | ICD-10-CM | POA: Diagnosis not present

## 2022-03-28 DIAGNOSIS — R7303 Prediabetes: Secondary | ICD-10-CM

## 2022-03-28 DIAGNOSIS — I4901 Ventricular fibrillation: Secondary | ICD-10-CM | POA: Diagnosis not present

## 2022-03-28 DIAGNOSIS — K31819 Angiodysplasia of stomach and duodenum without bleeding: Secondary | ICD-10-CM | POA: Diagnosis not present

## 2022-03-28 LAB — BASIC METABOLIC PANEL
Anion gap: 9 (ref 5–15)
BUN: 27 mg/dL — ABNORMAL HIGH (ref 8–23)
CO2: 23 mmol/L (ref 22–32)
Calcium: 8.4 mg/dL — ABNORMAL LOW (ref 8.9–10.3)
Chloride: 107 mmol/L (ref 98–111)
Creatinine, Ser: 1.16 mg/dL (ref 0.61–1.24)
GFR, Estimated: >60 mL/min (ref >60)
Glucose, Bld: 101 mg/dL — ABNORMAL HIGH (ref 70–99)
Potassium: 4.3 mmol/L (ref 3.5–5.1)
Sodium: 139 mmol/L (ref 135–145)

## 2022-03-28 LAB — LIPOPROTEIN A (LPA): Lipoprotein (a): 71 nmol/L — ABNORMAL HIGH (ref <75.0)

## 2022-03-28 LAB — GLUCOSE, CAPILLARY
Glucose-Capillary: 95 mg/dL (ref 70–99)
Glucose-Capillary: 97 mg/dL (ref 70–99)
Glucose-Capillary: 100 mg/dL — ABNORMAL HIGH (ref 70–99)
Glucose-Capillary: 124 mg/dL — ABNORMAL HIGH (ref 70–99)

## 2022-03-28 LAB — BRAIN NATRIURETIC PEPTIDE: B Natriuretic Peptide: 167.3 pg/mL — ABNORMAL HIGH (ref 0.0–100.0)

## 2022-03-28 MED ORDER — ALPRAZOLAM 0.5 MG PO TABS
0.5000 mg | ORAL_TABLET | Freq: Three times a day (TID) | ORAL | Status: DC | PRN
  Administered 2022-03-28: 0.5 mg via ORAL
  Filled 2022-03-28: qty 1

## 2022-03-28 MED ORDER — FUROSEMIDE 10 MG/ML IJ SOLN
40.0000 mg | Freq: Every day | INTRAMUSCULAR | Status: DC
  Administered 2022-03-28: 40 mg via INTRAVENOUS
  Filled 2022-03-28 (×2): qty 4

## 2022-03-28 NOTE — Evaluation (Signed)
Occupational Therapy Evaluation Patient Details Name: Aaron [name] MRN: [id number] DOB: [birth year] Today's Date: 03/28/2022   History of Present Illness Pt is a 76 y.o. male admitted 03/24/22 with dizziness and fall. Workup for significant anemia, concern for GIB. Brief vfib arrest in ED. S/p upper endoscopy 9/21. S/p LHC 9/22. ETT 9/21-9/22. 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   Clinical Impression   Pt reports needing use of rollator at baseline for mobility, reports bathing sinkside at baseline and needing PRN assist for LB dressing. Pt currently limited by nausea, needing min-max A for seated ADLs, min A for bed mobility, and min guard for lateral scoot transfer to [facility]. Pt declining further mobility at this time. Pt presenting with impairments listed below, will follow acutely. Recommend HHOT at d/c.      Recommendations for follow up therapy are one component of a multi-disciplinary discharge planning process, led by the attending physician.  Recommendations may be updated based on patient status, additional functional criteria and insurance authorization.   Follow Up Recommendations  Home health OT    Assistance Recommended at Discharge Intermittent Supervision/Assistance  Patient can return home with the following A little help with walking and/or transfers;A little help with bathing/dressing/bathroom;Assistance with cooking/housework;Direct supervision/assist for medications management;Direct supervision/assist for financial management;Assist for transportation;Help with stairs or ramp for entrance    Functional Status Assessment  Patient has had a recent decline in their functional status and demonstrates the ability to make significant improvements in function in a reasonable and predictable amount of time.  Equipment Recommendations  Tub/shower seat    Recommendations for Other Services PT  consult     Precautions / Restrictions Precautions Precautions: Fall;Other (comment) Precaution Comments: R-side rib pain from CPR Restrictions Weight Bearing Restrictions: No      Mobility Bed Mobility Overal bed mobility: Needs Assistance Bed Mobility: Sit to Sidelying         Sit to sidelying: Min assist General bed mobility comments: assisting BLE's into bed    Transfers Overall transfer level: Needs assistance   Transfers: Bed to chair/wheelchair/BSC            Lateral/Scoot Transfers: Min guard General transfer comment: increased cues to scoot toward HOB, pt declining further mobility/OOB due to nausea from NPO      Balance Overall balance assessment: Needs assistance Sitting-balance support: No upper extremity supported Sitting balance-Leahy Scale: Good     Standing balance support: No upper extremity supported, During functional activity Standing balance-Leahy Scale: Fair Standing balance comment: static and dynamic stability improved with RW                           ADL either performed or assessed with clinical judgement   ADL Overall ADL's : Needs assistance/impaired Eating/Feeding: NPO   Grooming: Min guard   Upper Body Bathing: Minimal assistance   Lower Body Bathing: Maximal assistance   Upper Body Dressing : Minimal assistance   Lower Body Dressing: Maximal assistance Lower Body Dressing Details (indicate cue type and reason): pulling up socks Toilet Transfer: Minimal assistance;Rolling walker (2 wheels);Ambulation;Regular Toilet   Toileting- Water quality scientist and Hygiene: Moderate assistance       Functional mobility during ADLs: Minimal assistance       Vision Baseline Vision/History: 1 Wears glasses Additional Comments: R prosthetic eye at baseline     Perception     Praxis  Pertinent Vitals/Pain Pain Assessment Pain Assessment: Faces Pain Score: 4  Faces Pain Scale: Hurts little more Pain  Location: R ribs and shoulder from fall/CPR Pain Descriptors / Indicators: Discomfort Pain Intervention(s): Limited activity within patient's tolerance, Monitored during session, Repositioned     Hand Dominance Right   Extremity/Trunk Assessment Upper Extremity Assessment Upper Extremity Assessment: RUE deficits/detail RUE Deficits / Details: ~90* shoulder AROM before pain, reports falling on R shoulder and has R rib pain; reports has been able to self feed and perform ADLs RUE Sensation: WNL RUE Coordination: decreased gross motor   Lower Extremity Assessment Lower Extremity Assessment: Generalized weakness       Communication Communication Communication: No difficulties   Cognition Arousal/Alertness: Awake/alert Behavior During Therapy: WFL for tasks assessed/performed Overall Cognitive Status: Impaired/Different from baseline Area of Impairment: Memory, Following commands, Awareness                     Memory: Decreased short-term memory Following Commands: Follows one step commands with increased time   Awareness: Anticipatory   General Comments: pt needing increased reminders that he is NPO until his procedure today, pt perseverating on needing to order food this AM     General Comments  VSS on RA    Exercises     Shoulder Instructions      Home Living Family/patient expects to be discharged to:: Private residence Living Arrangements: Spouse/significant other Available Help at Discharge: Family;Available 24 hours/day Type of Home: House Home Access: Stairs to enter [employer] of Steps: 3 Entrance Stairs-Rails: Right Home Layout: One level     Bathroom Shower/Tub: Tub/shower unit         Home Equipment: Conservation officer, nature (2 wheels);Rollator (4 wheels);Cane - single point   Additional Comments: has some AE      Prior Functioning/Environment Prior Level of Function : Independent/Modified Independent;Driving              Mobility Comments: Mod indep with intermittent use of rollator; uses cane mostly ADLs Comments: has been doing bird bathing at sink mod indep due to fatigue/low energy over past few months. typically does majority of household tasks since wife uses rollator; does IADLs        OT Problem List:        OT Treatment/Interventions:      OT Goals(Current goals can be found in the care plan section) Acute Rehab OT Goals Patient Stated Goal: none stated OT Goal Formulation: With patient Time For Goal Achievement: 04/11/22 Potential to Achieve Goals: Good ADL Goals Pt Will Perform Upper Body Dressing: with supervision;sitting Pt Will Perform Lower Body Dressing: with supervision;sit to/from stand Pt Will Transfer to Toilet: with supervision;ambulating;regular height toilet Additional ADL Goal #1: pt will be able to stand x5 min for functional task in order to improve activity tolerance for ADLs  OT Frequency: Min 2X/week    Co-evaluation              AM-PAC OT "6 Clicks" Daily Activity     Outcome Measure Help from another person eating meals?: None Help from another person taking care of personal grooming?: A Little Help from another person toileting, which includes using toliet, bedpan, or urinal?: A Little Help from another person bathing (including washing, rinsing, drying)?: A Lot Help from another person to put on and taking off regular upper body clothing?: A Little Help from another person to put on and taking off regular lower body clothing?: A Lot 6 Click Score:  17   End of Session Nurse Communication: Mobility status  Activity Tolerance: Patient limited by fatigue Patient left: in bed;with call bell/phone within reach;with bed alarm set  OT Visit Diagnosis: Unsteadiness on feet (R26.81);Repeated falls (R29.6);Muscle weakness (generalized) (M62.81)                Time: 0175-1025 OT Time Calculation (min): 20 min Charges:  OT General Charges $OT Visit: 1 Visit OT  Evaluation $OT Eval Low Complexity: 1 Low  Lynnda Child, OTD, OTR/L Acute Rehab [phone]) [phone] [place] 03/28/2022, 9:44 AM

## 2022-03-28 NOTE — Progress Notes (Addendum)
Progress Note   Patient: Aaron [name] XTK:[id number] DOB: March 12, 1946 DOA: 03/24/2022     4 DOS: the patient was seen and examined on 03/28/2022   Brief hospital course: Aaron [name] was admitted to the hospital with the working diagnosis of VF arrest in the setting of severe anemia, acute on chronic blood loss.   76 yo male with the past medical history of coronary artery disease, DVT, PE and iron deficiency. Patient had intermittent weakness and dizziness for at least 2 months, worsening and prompting to come to the hospital. In the Ed had a V fib arrest, patient recovered spontaneous circulation. On his initial physical examination his blood pressure was 105/56, HR 73, RR 17 and 02 saturation 100%, pale, awake and alert, lungs with no wheezing, heart with S1 and S2 present and regular, abdomen with no distention, positive lower extremity edema.   Na 131, K 4,0 Cl 103 bicarbonate 19 glucose 122, bun 28 cr 1,91  BNP 178  High sensitive troponin 5,431 Wbc 5,9 hgb 4,8 plt 219   Patient was admitted to the intensive care unit on invasive mechanical ventilation. Received PRBC transfusions # 3 09/21 EGD with gastric antral vascular ectasia with bleeding, treated with Argon plasma coagulation with success  09/22 coronary angiography with mild to moderate obstructive coronary artery diease. 09/22 patient extubated.   09/23 transfer to [facility].   09/24 patient hemodynamically stable, to high risk to resume full anticoagulation. Plan to resume low dose aspirin and placed IVC filter.  09/25 IR Consulted for IVC filter placement.   Assessment and Plan: * Ventricular fibrillation (HCC) Cardiac arrest Vfib, due to severe anemia due to acute blood loss anemia, due to upper GI bleed.   Patient with hemodynamic stability after 3 units PRBC transfusion.  Follow up hgb 8.5   Echocardiogram with preserved LV systolic function with no regional wall motion abnormalities Coronary angiography with no  significant coronary artery disease.   GAVE (gastric antral vascular ectasia) Spoke with GI patient is at a very high risk for re bleeding.  GAVE will need subsequent treatments and healing process is prolonged.  Will need frequent cell count monitoring.   Continue with proton pump inhibition and follow up with GI as outpatient.   Acute on chronic diastolic CHF (congestive heart failure) (HCC) Echocardiogram with preserved LV systolic function with EF 55 to 60%, mild LVH, RV with preserved systolic function. No significant valvular disease.    Continue with amlodipine for blood pressure control. Patient will benefit from SGLT 2 inh and possible transition to ARB when renal function more stable.   (mild reduced LV systolic function systolic and diastolic heart failure)  Positive lower extremity edema, started on furosemide Continue with empagliflozin   Continue blood pressure monitoring, systolic 992 to 426 mmHg.    History of pulmonary embolism CT chest from 2012 with bilateral pulmonary embolism with significant clot burden, right ventricular strain pattern.   2015 acute occlusive deep vein thrombosis involving peroneal and posterior tibial veins of the left lower extremity. Acute non occlusive deep vein thrombosis noted in the popliteal vein of the left lower extremity.   Considering his persistent iron deficiency anemia, high risk of bleeding and recurrent thrombosis, plan to proceed with IVC filer while treatment of GAVE.  Potentially in the future the filter can be removed and patient placed back on anticoagulation.  Currently resuming anticoagulation may be to high risk. Cased discussed with patient and Aaron [name] from cardiology.   IR consulted for  IVC filter placement.   Acute kidney injury superimposed on chronic kidney disease (HCC) CKD stage 2 , hyponatremia   Renal function with serum cr at 1.16 with K at 4,3 and serum bicarbonate at 23.  Continue with SGLT 2 inh   If renal function stable may benefit from ARB Started furosemide 40 mg daily.   Hypertension Continue blood pressure control with amlodipine, if renal function continue to improve will consider transition to ARB.   Pre-diabetes Continue glucose cover and monitoring with insulin sliding scale.  Fasting glucose this am is 101 mg/dl.   Depression Anxiety.  Plan to continue with venlafaxine, clonazepam and trazodone Add as needed alprazolam during his hospitalization   Class 1 obesity Calculated BMI is 33,1         Subjective: Patient with no chest pain or dyspnea, positive lower extremity edema, this am with anxiety   Physical Exam: Vitals:   03/27/22 0636 03/27/22 2230 03/28/22 0533 03/28/22 1218  BP:  (!) 162/72 (!) 157/68 (!) 161/61  Pulse:    73  Resp:   20 18  Temp:   98.1 F (36.7 C) 97.9 F (36.6 C)  TempSrc:   Oral Oral  SpO2:    100%  Weight: 112.5 kg  112.3 kg   Height:       Neurology awake and alert ENT with no pallor Cardiovascular with S1 and S2 present and rhythmic with no gallops, rubs or murmurs Respiratory with no rales or wheezing Abdomen with no distention Positive lower extremity edema + to ++  Data Reviewed:    Family Communication: no family at the bedside   Disposition: Status is: Inpatient Remains inpatient appropriate because: sp cardiac arrest, gastric bleeding   Planned Discharge Destination: Home      Author: Tawni Millers, MD 03/28/2022 12:40 PM  For on call review [email or web address].

## 2022-03-28 NOTE — Care Management Important Message (Signed)
Important Message  Patient Details  Name: Aaron [name] MRN: [id number] Date of Birth: Dec 07, 1945   Medicare Important Message Given:  Yes     Shelda Altes 03/28/2022, 9:44 AM

## 2022-03-28 NOTE — Progress Notes (Signed)
Mobility Specialist - Progress Note   03/28/22 1500  Mobility  Activity Refused mobility   Pt refused mobility d/t " lack of energy" as a result from waiting for procedure. Will follow up if time permits.  Larey Seat

## 2022-03-28 NOTE — TOC Benefit Eligibility Note (Addendum)
Patient Teacher, English as a foreign language completed.    The patient is currently admitted and upon discharge could be taking Farxiga 10 mg.  The current 30 day co-pay is $151.18 due to being in Coverage Gap (donut hole).   The patient is currently admitted and upon discharge could be taking Jardiance 10 mg.  The current 30 day co-pay is $158.66 due to being in Coverage Gap (donut hole).   The patient is insured through [employer] Patient Advocate Specialist [place] Patient Advocate Team Direct Number: [phone]  Fax: [phone]

## 2022-03-28 NOTE — Telephone Encounter (Signed)
Pharmacy Patient Advocate Encounter  Insurance verification completed.    The patient is insured through [employer] Medicare Part D   The patient is currently admitted and ran test claims for the following: Farxiga, Jardiance.  Copays and coinsurance results were relayed to Inpatient clinical team.  

## 2022-03-28 NOTE — Progress Notes (Signed)
Pt refused cpap for tonight 

## 2022-03-28 NOTE — Telephone Encounter (Signed)
Since the patient did not reestablish with me, and given the nature of his problem (chart and hospital endoscopy report reviewed), it may be best to follow-up with Dr. Tarri Glenn for continuity purposes.  Thanks for checking

## 2022-03-28 NOTE — Telephone Encounter (Signed)
Patient rescheduled for 04/26/22 at 2:15 pm with Estill Bamberg, [place]. Pt is currently admitted, appointment date/time will appear on discharge AVS summary.

## 2022-03-28 NOTE — Assessment & Plan Note (Signed)
Anxiety.  Plan to continue with venlafaxine, clonazepam and trazodone PRN alprazolam during his hospitalization

## 2022-03-28 NOTE — Progress Notes (Addendum)
Rounding Note    Patient Name: Aaron [name] Date of Encounter: 03/28/2022  [place] Cardiologist: Peter Martinique, MD   Subjective   Patient resting in bed this morning, anxious appearing. He reports stress related to his pending IVC filter placement today and frustration with uncertain timing of this. I spent time talking with patient who reports that he has felt quite stressed after his recent in ED cardiac arrest. He tells me that he is the "last of my generation in my family" and that the weight of this has caused him quite a bit of stress. Denies chest pain, shortness of breath. Continues to notice leg swelling and feels that this is a result of decreased leg elevation compared to his home regimen.  Inpatient Medications    Scheduled Meds:  amLODipine  5 mg Oral QHS   aspirin EC  81 mg Oral Daily   atorvastatin  80 mg Oral Daily   clonazePAM  0.5 mg Oral QHS   empagliflozin  10 mg Oral Daily   ezetimibe  10 mg Oral Daily   ferrous sulfate  325 mg Oral Q breakfast   insulin aspart  0-15 Units Subcutaneous TID WC   melatonin  3 mg Oral Once   pantoprazole  40 mg Oral Daily   polyethylene glycol  17 g Oral BID   sodium chloride flush  3 mL Intravenous Q12H   traZODone  50 mg Oral QHS   venlafaxine XR  150 mg Oral Q breakfast   Continuous Infusions:  sodium chloride 10 mL/hr at 03/25/22 1200   sodium chloride     PRN Meds: sodium chloride, sodium chloride, acetaminophen, alum & mag hydroxide-simeth, docusate sodium, HYDROcodone-acetaminophen, HYDROmorphone (DILAUDID) injection, ondansetron (ZOFRAN) IV, sodium chloride flush   Vital Signs    Vitals:   03/27/22 0537 03/27/22 0636 03/27/22 2230 03/28/22 0533  BP: (!) 150/63  (!) 162/72 (!) 157/68  Pulse:      Resp:    20  Temp: 98.1 F (36.7 C)   98.1 F (36.7 C)  TempSrc: Oral   Oral  SpO2:      Weight:  112.5 kg  112.3 kg  Height:       No intake or output data in the 24 hours ending 03/28/22  0836    03/28/2022    5:33 AM 03/27/2022    6:36 AM 03/26/2022    4:20 AM  Last 3 Weights  Weight (lbs) 247 lb 9.6 oz 248 lb 205 lb 1.6 oz  Weight (kg) 112.311 kg 112.492 kg 93.033 kg      Telemetry    Sinus rhythm - Personally Reviewed  ECG    No ECG today  Physical Exam   GEN: No acute distress.   Neck: JVD noted 1 cm above clavicle Cardiac: RRR, no murmurs, rubs, or gallops.  Respiratory: Clear to auscultation bilaterally. GI: Soft, nontender, non-distended  MS: 2+ bilateral lower extremity edema Neuro:  Nonfocal  Psych: Normal affect   Labs    High Sensitivity Troponin:   Recent Labs  Lab 03/24/22 0415 03/24/22 0631  TROPONINIHS 5,431* 3,894*     Chemistry Recent Labs  Lab 03/24/22 0415 03/25/22 0517 03/26/22 0339 03/27/22 0424 03/28/22 0243  NA 131* 131* 138 138 139  K 4.0 3.8 3.8 3.9 4.3  CL 103 100 104 106 107  CO2 19* 18* '22 22 23  '$ GLUCOSE 122* 142* 113* 101* 101*  BUN 28* 31* 35* 29* 27*  CREATININE 1.91* 1.82*  1.82* 1.13 1.16  CALCIUM 8.1* 7.7* 9.1 8.5* 8.4*  MG  --  2.1  --   --   --   PROT 5.5*  --   --   --   --   ALBUMIN 3.1*  --   --   --   --   AST 31  --   --   --   --   ALT 22  --   --   --   --   ALKPHOS 71  --   --   --   --   BILITOT 0.2*  --   --   --   --   GFRNONAA 36* 38* 38* >60 >60  ANIONGAP '9 13 12 10 9    '$ Lipids  Recent Labs  Lab 03/25/22 0517  TRIG 823*    Hematology Recent Labs  Lab 03/25/22 0517 03/25/22 1943 03/26/22 0339 03/27/22 0424  WBC 6.3 13.8* 10.9*  --   RBC 2.53* 3.27* 3.28*  --   HGB 7.3* 8.7* 8.8* 8.5*  HCT 21.5* 27.5* 27.8* 27.6*  MCV 85.0 84.1 84.8  --   MCH 28.9 26.6 26.8  --   MCHC 34.0 31.6 31.7  --   RDW 16.3* 16.0* 16.2*  --   PLT 209 314 311  --    Thyroid No results for input(s): "TSH", "FREET4" in the last 168 hours.  BNP Recent Labs  Lab 03/24/22 0415  BNP 178.8*    DDimer No results for input(s): "DDIMER" in the last 168 hours.   Radiology    ECHOCARDIOGRAM  COMPLETE  Result Date: 03/26/2022    ECHOCARDIOGRAM REPORT   Patient Name:   Aaron [name] Date of Exam: 03/26/2022 Medical Rec #:  [id number]      Height:       66.0 in Accession #:    [id number]     Weight:       205.1 lb Date of Birth:  02-22-1946      BSA:          2.021 m Patient Age:    76 years       BP:           139/55 mmHg Patient Gender: M              HR:           79 bpm. Exam Location:  Inpatient Procedure: 2D Echo, Cardiac Doppler and Color Doppler Indications:    Cardiac arrest  History:        Patient has prior history of Echocardiogram examinations, most                 recent 03/24/2022. CAD, PE, Arrythmias:Ventricular Fibrillation                 and Cardiac Arrest; Risk Factors:Hypertension, Sleep Apnea and                 Former Smoker.  Sonographer:    Greer Pickerel Referring Phys: [id number] Donato Heinz  Sonographer Comments: Image acquisition challenging due to patient body habitus and Image acquisition challenging due to respiratory motion. Patient was in too much pain from resucitation performed night before to [facility] well. IMPRESSIONS  1. Study not well visualized to full assess endocardial borders. Left ventricular ejection fraction, by estimation, is 55 to 60%. The left ventricle has normal function. The left ventricle has no regional wall motion abnormalities. There is mild left ventricular hypertrophy.  Left ventricular diastolic parameters were normal.  2. Right ventricular systolic function is normal. The right ventricular size is normal. There is normal pulmonary artery systolic pressure.  3. The mitral valve is normal in structure. No evidence of mitral valve regurgitation.  4. The aortic valve was not well visualized. Aortic valve regurgitation is not visualized.  5. The inferior vena cava is normal in size with greater than 50% respiratory variability, suggesting right atrial pressure of 3 mmHg. FINDINGS  Left Ventricle: Study not well visualized to full assess  endocardial borders. Left ventricular ejection fraction, by estimation, is 55 to 60%. The left ventricle has normal function. The left ventricle has no regional wall motion abnormalities. The left  ventricular internal cavity size was normal in size. There is mild left ventricular hypertrophy. Left ventricular diastolic parameters were normal. Right Ventricle: The right ventricular size is normal. No increase in right ventricular wall thickness. Right ventricular systolic function is normal. There is normal pulmonary artery systolic pressure. The tricuspid regurgitant velocity is 2.34 m/s, and  with an assumed right atrial pressure of 3 mmHg, the estimated right ventricular systolic pressure is 40.9 mmHg. Left Atrium: Left atrial size was normal in size. Right Atrium: Right atrial size was normal in size. Pericardium: There is no evidence of pericardial effusion. Mitral Valve: The mitral valve is normal in structure. No evidence of mitral valve regurgitation. Tricuspid Valve: The tricuspid valve is normal in structure. Tricuspid valve regurgitation is not demonstrated. Aortic Valve: The aortic valve was not well visualized. Aortic valve regurgitation is not visualized. Aortic valve mean gradient measures 12.0 mmHg. Aortic valve peak gradient measures 24.4 mmHg. Aortic valve area, by VTI measures 1.76 cm. Pulmonic Valve: The pulmonic valve was not well visualized. Pulmonic valve regurgitation is not visualized. Aorta: The aortic root and ascending aorta are structurally normal, with no evidence of dilitation. Venous: The inferior vena cava is normal in size with greater than 50% respiratory variability, suggesting right atrial pressure of 3 mmHg. IAS/Shunts: The interatrial septum was not well visualized.  LEFT VENTRICLE PLAX 2D LVIDd:         5.60 cm      Diastology LVIDs:         4.15 cm      LV e' medial:  8.70 cm/s LV PW:         1.30 cm      LV e' lateral: 14.10 cm/s LV IVS:        1.00 cm LVOT diam:     2.10  cm LV SV:         83 LV SV Index:   41 LVOT Area:     3.46 cm  LV Volumes (MOD) LV vol d, MOD A2C: 162.0 ml LV vol d, MOD A4C: 182.0 ml LV vol s, MOD A2C: 69.8 ml LV vol s, MOD A4C: 79.8 ml LV SV MOD A2C:     92.2 ml LV SV MOD A4C:     182.0 ml LV SV MOD BP:      99.5 ml RIGHT VENTRICLE RV S prime:     15.70 cm/s TAPSE (M-mode): 2.8 cm LEFT ATRIUM            Index        RIGHT ATRIUM           Index LA diam:      4.00 cm  1.98 cm/m   RA Area:     17.50 cm LA Vol (A2C): 57.0 ml  28.20 ml/m  RA Volume:   45.30 ml  22.41 ml/m LA Vol (A4C): 105.0 ml 51.95 ml/m  AORTIC VALVE AV Area (Vmax):    1.88 cm AV Area (Vmean):   1.89 cm AV Area (VTI):     1.76 cm AV Vmax:           247.00 cm/s AV Vmean:          160.000 cm/s AV VTI:            0.470 m AV Peak Grad:      24.4 mmHg AV Mean Grad:      12.0 mmHg LVOT Vmax:         134.00 cm/s LVOT Vmean:        87.500 cm/s LVOT VTI:          0.239 m LVOT/AV VTI ratio: 0.51  AORTA Ao Root diam: 3.40 cm Ao Asc diam:  3.40 cm MITRAL VALVE               TRICUSPID VALVE MV Area (PHT): 3.42 cm    TR Peak grad:   21.9 mmHg MV Decel Time: 222 msec    TR Vmax:        234.00 cm/s MV A velocity: 80.30 cm/s                            SHUNTS                            Systemic VTI:  0.24 m                            Systemic Diam: 2.10 cm Phineas Inches Electronically signed by Phineas Inches Signature Date/Time: 03/26/2022/1:35:39 PM    Final     Cardiac Studies   03/26/2022 TTE  Sonographer Comments: Image acquisition challenging due to patient body  habitus and Image acquisition challenging due to respiratory motion.  Patient was in too much pain from resucitation performed night before to  [facility] well.  IMPRESSIONS     1. Study not well visualized to full assess endocardial borders. Left  ventricular ejection fraction, by estimation, is 55 to 60%. The left  ventricle has normal function. The left ventricle has no regional wall  motion abnormalities. There is mild left   ventricular hypertrophy. Left ventricular diastolic parameters were  normal.   2. Right ventricular systolic function is normal. The right ventricular  size is normal. There is normal pulmonary artery systolic pressure.   3. The mitral valve is normal in structure. No evidence of mitral valve  regurgitation.   4. The aortic valve was not well visualized. Aortic valve regurgitation  is not visualized.   5. The inferior vena cava is normal in size with greater than 50%  respiratory variability, suggesting right atrial pressure of 3 mmHg.   FINDINGS   Left Ventricle: Study not well visualized to full assess endocardial  borders. Left ventricular ejection fraction, by estimation, is 55 to 60%.  The left ventricle has normal function. The left ventricle has no regional  wall motion abnormalities. The left   ventricular internal cavity size was normal in size. There is mild left  ventricular hypertrophy. Left ventricular diastolic parameters were  normal.   Right Ventricle: The right ventricular size is normal. No increase in  right ventricular wall thickness. Right  ventricular systolic function is  normal. There is normal pulmonary artery systolic pressure. The tricuspid  regurgitant velocity is 2.34 m/s, and   with an assumed right atrial pressure of 3 mmHg, the estimated right  ventricular systolic pressure is 99.2 mmHg.   Left Atrium: Left atrial size was normal in size.   Right Atrium: Right atrial size was normal in size.   Pericardium: There is no evidence of pericardial effusion.   Mitral Valve: The mitral valve is normal in structure. No evidence of  mitral valve regurgitation.   Tricuspid Valve: The tricuspid valve is normal in structure. Tricuspid  valve regurgitation is not demonstrated.   Aortic Valve: The aortic valve was not well visualized. Aortic valve  regurgitation is not visualized. Aortic valve mean gradient measures 12.0  mmHg. Aortic valve peak gradient  measures 24.4 mmHg. Aortic valve area, by  VTI measures 1.76 cm.   Pulmonic Valve: The pulmonic valve was not well visualized. Pulmonic valve  regurgitation is not visualized.   Aorta: The aortic root and ascending aorta are structurally normal, with  no evidence of dilitation.   Venous: The inferior vena cava is normal in size with greater than 50%  respiratory variability, suggesting right atrial pressure of 3 mmHg.   IAS/Shunts: The interatrial septum was not well visualized.   03/25/2022 LHC    Dist LAD lesion is 10% stenosed.   1st Mrg lesion is 30% stenosed.   1.  Diffuse mild to moderate obstructive disease with patent mid LAD stent. 2.  Elevated LVEDP of 25 mmHg.   Diagnostic Dominance: Co-dominant   Patient Profile     Aaron [name] is a 76 y.o. 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 Patient presented to the ED after a ground level fall at home in the setting of worsening dyspnea, dizziness, weakness, and pain. Initial ECG notable for ST depression. Hemoglobin noted low at 4.8. While in the ED, patient had a VF arrest, received CPR and defibrillation x1.   Assessment & Plan    S/P VF Arrest CAD Elevated troponin with demand ischemia HTN  Patient with VF arrest while in [facility] ED. LHC on 9/22 found nonobstructive CAD and no re-stenosis of mid LAD stent. 9/23 TTE with LVEF 55-60%. Troponin 5431, [id number].  VF still suspected secondary to significant anemia, HBG 4.8. Troponin leak very likely 2/2 demand ischemia with anemia. LHC without acute findings. Resume ASA '81mg'$ , cleared with GI per Dr. Cathlean Sauer. Continue atorvastatin '80mg'$  QD Continue Amlodipine '5mg'$  QD  Acute HFpEF  9/23 TTE with LVEF 55-60%, normal RV function, RAP 3 mmHg. LVEDP 25 mmHg on 9/22  LHC. BNP 178.8. On exam today, patient with 2+ bilateral lower extremity edema. Very mild elevation of JVP, ~1cm above clavicle.  Patient is currently NPO for IVC filter procedure. Consider cautious diuresis upon resuming diet.   Severe acute on chronic anemia  Patient with a HGB of 4.8 on admission. Chart review shows chronic anemia over the last several year with multiple iron infusions this year. Patient was referred to GI in August 2/2 positive fecal occult blood test. 9/21 EGD with gastric antral vascular ectasia and bleeding which was successfully treated with APC.   Management per GI/primary hospitalist team.  GI recommending outpatient colonoscopy  History of recurrent DVT/PE  Patient has  been on Xarelto '20mg'$  for prophylaxis. As patient likely to require additional treatment for his GI bleeding, agree with hospitalist and GI that Xarelto should be held until risk of rebleeding is lower. Patient scheduled to received IVC filter today.    For questions or updates, please contact [place] Please consult [email or web address] for contact info under        Signed, Lily Kocher, PA-C  03/28/2022, 8:36 AM    Patient seen and examined.  Agree with above documentation.  On exam, patient is alert and oriented, regular rate and rhythm, no murmurs, lungs CTAB, 2+ LE edema.  Appears hypervolemic on exam, will give dose of IV Lasix today.  Planning IVC filter.  Donato Heinz, MD

## 2022-03-28 NOTE — Consult Note (Signed)
Chief Complaint: Patient was seen in consultation today for  Chief Complaint  Patient presents with   Fall   at the request of Dr Jimmy Picket Arrien  Supervising Physician: Markus Daft  Patient Status: [facility] - In-pt  History of Present Illness: CRISTAN SCHERZER is a 76 y.o. male who Mr. Mucha was admitted to the hospital with the working diagnosis of VF arrest in the setting of severe anemia, acute blood loss, secondary to upper GI bleed  76 yo male with the past medical history of coronary artery disease, DVT, PE and iron deficiency. Patient had intermittent weakness and dizziness for at least 2 months, worsening and prompting to come to the hospital. In the Ed had a V fib arrest, patient recovered spontaneous circulation. On his initial physical examination his blood pressure was 105/56, HR 73, RR 17 and 02 saturation 100%, pale, awake and alert, lungs with no wheezing, heart with S1 and S2 present and regular, abdomen with no distention, positive lower extremity edema.   Though patient now hemodynamically stable, he is deemed too high risk to resume full anticoagulation.  IR consulted for IVC filter.  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"   Heart murmur    Hyperlipidemia    Hypertension    Insomnia    Osteoarthritis    PONV (postoperative nausea and vomiting)    2006 - knee surgery   Pre-diabetes    Psoriasis    PTSD (post-traumatic stress disorder)    Pulmonary embolism ([place]) ~ 03/2006   bilaterally   Seasonal allergies    Shortness of breath 06/08/11   "w/exertion; that's why I'm here"   Sleep apnea, obstructive    Umbilical hernia      Past Surgical History:  Procedure Laterality Date   BUBBLE STUDY  03/24/2022   Procedure: BUBBLE STUDY;  Surgeon: Thornton Park, MD;  Location: [place];  Service: Gastroenterology;;   CARDIAC CATHETERIZATION     carpel tunnel release Bilateral ~ 06/2005/~ 08/2005   right/left   COLONOSCOPY W/ BIOPSIES AND POLYPECTOMY     CORONARY ANGIOPLASTY     CORONARY STENT PLACEMENT  02/13/2004   1   ENUCLEATION Right    age 72   ESOPHAGOGASTRODUODENOSCOPY (EGD) WITH PROPOFOL N/A 03/24/2022   Procedure: ESOPHAGOGASTRODUODENOSCOPY (EGD) WITH PROPOFOL;  Surgeon: Thornton Park, MD;  Location: [place];  Service: Gastroenterology;  Laterality: N/A;   EYE SURGERY     age 89; "ruptured  right pupil"   EYE SURGERY     age 19   [place]   placed artificial right eye   HEMOSTASIS CONTROL  03/24/2022   Procedure: HEMOSTASIS CONTROL;  Surgeon: Thornton Park, MD;  Location: [place];  Service: Gastroenterology;;   HOT HEMOSTASIS N/A 03/24/2022   Procedure: HOT HEMOSTASIS (ARGON PLASMA COAGULATION/BICAP);  Surgeon: Thornton Park, MD;  Location: [place];  Service: Gastroenterology;  Laterality: N/A;   INGUINAL HERNIA REPAIR Right 1983   INGUINAL HERNIA REPAIR Left 1985   KNEE ARTHROSCOPY  ~ 06/2005  right   LEFT HEART CATH AND CORONARY ANGIOGRAPHY N/A 03/25/2022   Procedure: LEFT HEART CATH AND CORONARY ANGIOGRAPHY;  Surgeon: Early Osmond, MD;  Location: [place] [facility];  Service: Cardiovascular;  Laterality: N/A;   MULTIPLE TOOTH EXTRACTIONS     spinal injections  2006-2007   "maybe 10 injections for herniated discs"   TEE WITHOUT CARDIOVERSION  03/24/2022   Procedure: TRANSESOPHAGEAL ECHOCARDIOGRAM (TEE);  Surgeon: Thornton Park, MD;  Location: [place];  Service: Gastroenterology;;   TONSILLECTOMY     "when I was real young"   TOTAL KNEE ARTHROPLASTY Left 10/09/2015   Procedure: TOTAL KNEE ARTHROPLASTY;  Surgeon: Dorna Leitz, MD;  Location: [place];   Service: Orthopedics;  Laterality: Left;   TOTAL KNEE ARTHROPLASTY Right 04/04/2016   Procedure: TOTAL KNEE ARTHROPLASTY;  Surgeon: Dorna Leitz, MD;  Location: [place];  Service: Orthopedics;  Laterality: Right;    Allergies: Wellbutrin [bupropion] and Amoxicillin  Medications: Prior to Admission medications   Medication Sig Start Date End Date Taking? Authorizing Provider  amLODipine (NORVASC) 5 MG tablet TAKE 1 TABLET BY MOUTH EVERYDAY AT BEDTIME Patient taking differently: Take 5 mg by mouth at bedtime. 02/02/22  Yes [name], MD  atorvastatin (LIPITOR) 80 MG tablet TAKE 1 TABLET BY MOUTH ONCE  DAILY 02/10/22  Yes [name], MD  Carboxymethylcellulose Sodium 1 % GEL Place 1 application into both eyes at bedtime as needed (Dry eye).   Yes [provider]  clonazePAM (KLONOPIN) 0.5 MG tablet TAKE 1 TABLET BY MOUTH AT BEDTIME. 03/09/22  Yes [name], MD  ezetimibe (ZETIA) 10 MG tablet TAKE 1 TABLET BY MOUTH  DAILY Patient taking differently: Take 10 mg by mouth daily. 12/20/21  Yes [place], Peter M, MD  ferrous sulfate 325 (65 FE) MG EC tablet TAKE 1 TABLET BY MOUTH DAILY WITH BREAKFAST. SCHEDULE AN APPOINTMENT FOR FURTHER REFILLS Patient taking differently: Take 325 mg by mouth daily with breakfast. 01/03/22  Yes [place], Peter M, MD  pantoprazole (PROTONIX) 40 MG tablet Take 1 tablet (40 mg total) by mouth daily. 02/10/22  Yes [name], MD  polyethylene glycol (MIRALAX / GLYCOLAX) 17 g packet Take 17 g by mouth daily. With coffee   Yes [provider]  pseudoephedrine-guaifenesin (MUCINEX D) 60-600 MG 12 hr tablet Take 1 tablet by mouth every 12 (twelve) hours as needed for congestion.   Yes [provider]  telmisartan-hydrochlorothiazide (MICARDIS HCT) 80-25 MG tablet Take 1 tablet by mouth daily. 08/16/21  Yes [name], MD  tiZANidine (ZANAFLEX) 4 MG tablet Take 4 mg by mouth every 8 (eight) hours as needed for muscle spasms. 01/25/22  Yes  [provider]  traMADol (ULTRAM) 50 MG tablet TAKE 2 TABLETS (100 MG TOTAL) BY MOUTH EVERY 6 (SIX) HOURS AS NEEDED FOR MODERATE PAIN - SEVERE PAIN Patient taking differently: Take 100 mg by mouth every 6 (six) hours as needed for moderate pain. 03/09/22  Yes [name], MD  traZODone (DESYREL) 50 MG tablet TAKE 1 TABLET BY MOUTH EVERYDAY AT BEDTIME Patient taking differently: Take 50 mg by mouth at bedtime. 03/09/22  Yes [name], MD  venlafaxine XR (EFFEXOR-XR) 150 MG 24 hr capsule TAKE 1 CAPSULE BY MOUTH DAILY  WITH BREAKFAST Patient taking differently: Take 150 mg by mouth daily with breakfast. 12/20/21  Yes [name], MD  XARELTO 20 MG TABS tablet TAKE 1 TABLET BY MOUTH DAILY  WITH SUPPER Patient taking differently: Take  20 mg by mouth daily with supper. 03/06/22  Yes [name], MD  Daridorexant HCl (QUVIVIQ) 50 MG TABS Take 1 tablet by mouth at bedtime. Patient not taking: Reported on 03/24/2022 02/11/22   [name], MD     Family History  Problem Relation Age of Onset   Asthma Father    Emphysema Father    Breast cancer Sister    Liver cancer Sister    Asthma Sister    Asthma Brother     Social History   Socioeconomic History   Marital status: Married    Spouse name: Not on file   Number of children: 3   Years of education: Not on file   Highest education level: Not on file  Occupational History   Occupation: building maintenance    Comment: disabled  Tobacco Use   Smoking status: Former    Packs/day: 1.50    Years: 25.00    Total pack years: 37.50    Types: Cigarettes    Quit date: 07/10/1995    Years since quitting: 26.7   Smokeless tobacco: Never  Vaping Use   Vaping Use: Never used  Substance and Sexual Activity   Alcohol use: No   Drug use: No   Sexual activity: Not on file  Other Topics Concern   Not on file  Social History Narrative   Former Curator, 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   Recent stress, currently  undergoing financial difficulties.   Social Determinants of Health   Financial Resource Strain: Low Risk  (10/29/2021)   Overall Financial Resource Strain (CARDIA)    Difficulty of Paying Living Expenses: Not hard at all  Food Insecurity: No Food Insecurity (10/29/2021)   Hunger Vital Sign    Worried About Running Out of Food in the Last Year: Never true    Ran Out of Food in the Last Year: Never true  Transportation Needs: No Transportation Needs (10/29/2021)   PRAPARE - Hydrologist (Medical): No    Lack of Transportation (Non-Medical): No  Physical Activity: Insufficiently Active (10/29/2021)   Exercise Vital Sign    Days of Exercise per Week: 3 days    Minutes of Exercise per Session: 30 min  Stress: No Stress Concern Present (10/29/2021)   [place]    Feeling of Stress : Not at all  Social Connections: Socially Isolated (10/29/2021)   Social Connection and Isolation Panel [NHANES]    Frequency of Communication with Friends and Family: Three times a week    Frequency of Social Gatherings with Friends and Family: Three times a week    Attends Religious Services: Never    Active Member of Clubs or Organizations: No    Attends Archivist Meetings: Never    Marital Status: Widowed    Review of Systems  Constitutional:  Positive for activity change and fatigue.  HENT: Negative.    Eyes: Negative.   Respiratory:  Positive for cough and chest tightness.        Pain associated with right ribs, presumably secondary to chest compressions, worse with inhalation and coughing  Cardiovascular:  Positive for leg swelling.  Musculoskeletal:  Positive for arthralgias and myalgias.       Right shoulder pain, presumably from recent fall at home  Psychiatric/Behavioral: Negative.      Vital Signs: BP (!) 157/68 (BP Location: Left Arm)   Pulse 78   Temp  98.1 F (36.7 C) (Oral)   Resp 20    Ht '5\' 6"'$  (1.676 m)   Wt 247 lb 9.6 oz (112.3 kg)   SpO2 97%   BMI 39.96 kg/m   Physical Exam Vitals reviewed.  Constitutional:      General: He is not in acute distress.    Appearance: He is ill-appearing.  HENT:     Mouth/Throat:     Mouth: Mucous membranes are dry.     Pharynx: Oropharynx is clear.  Eyes:     Comments: Left eye tracks appropriately.  Right does not  Cardiovascular:     Rate and Rhythm: Normal rate.     Pulses: Normal pulses.  Pulmonary:     Effort: Pulmonary effort is normal.  Chest:     Chest wall: Tenderness present.  Skin:    Coloration: Skin is pale.  Neurological:     General: No focal deficit present.     Mental Status: He is alert and oriented to person, place, and time.  Psychiatric:        Mood and Affect: Mood normal.        Behavior: Behavior normal.     Imaging: ECHOCARDIOGRAM COMPLETE  Result Date: 03/26/2022    ECHOCARDIOGRAM REPORT   Patient Name:   JAYSHAUN PHILLIPS Date of Exam: 03/26/2022 Medical Rec #:  [id number]      Height:       66.0 in Accession #:    [id number]     Weight:       205.1 lb Date of Birth:  1946-04-22      BSA:          2.021 m Patient Age:    39 years       BP:           139/55 mmHg Patient Gender: M              HR:           79 bpm. Exam Location:  Inpatient Procedure: 2D Echo, Cardiac Doppler and Color Doppler Indications:    Cardiac arrest  History:        Patient has prior history of Echocardiogram examinations, most                 recent 03/24/2022. CAD, PE, Arrythmias:Ventricular Fibrillation                 and Cardiac Arrest; Risk Factors:Hypertension, Sleep Apnea and                 Former Smoker.  Sonographer:    Greer Pickerel Referring Phys: [id number] Donato Heinz  Sonographer Comments: Image acquisition challenging due to patient body habitus and Image acquisition challenging due to respiratory motion. Patient was in too much pain from resucitation performed night before to [facility] well. IMPRESSIONS  1.  Study not well visualized to full assess endocardial borders. Left ventricular ejection fraction, by estimation, is 55 to 60%. The left ventricle has normal function. The left ventricle has no regional wall motion abnormalities. There is mild left ventricular hypertrophy. Left ventricular diastolic parameters were normal.  2. Right ventricular systolic function is normal. The right ventricular size is normal. There is normal pulmonary artery systolic pressure.  3. The mitral valve is normal in structure. No evidence of mitral valve regurgitation.  4. The aortic valve was not well visualized. Aortic valve regurgitation is not visualized.  5. The inferior vena cava is normal in size  with greater than 50% respiratory variability, suggesting right atrial pressure of 3 mmHg. FINDINGS  Left Ventricle: Study not well visualized to full assess endocardial borders. Left ventricular ejection fraction, by estimation, is 55 to 60%. The left ventricle has normal function. The left ventricle has no regional wall motion abnormalities. The left  ventricular internal cavity size was normal in size. There is mild left ventricular hypertrophy. Left ventricular diastolic parameters were normal. Right Ventricle: The right ventricular size is normal. No increase in right ventricular wall thickness. Right ventricular systolic function is normal. There is normal pulmonary artery systolic pressure. The tricuspid regurgitant velocity is 2.34 m/s, and  with an assumed right atrial pressure of 3 mmHg, the estimated right ventricular systolic pressure is 84.1 mmHg. Left Atrium: Left atrial size was normal in size. Right Atrium: Right atrial size was normal in size. Pericardium: There is no evidence of pericardial effusion. Mitral Valve: The mitral valve is normal in structure. No evidence of mitral valve regurgitation. Tricuspid Valve: The tricuspid valve is normal in structure. Tricuspid valve regurgitation is not demonstrated. Aortic Valve:  The aortic valve was not well visualized. Aortic valve regurgitation is not visualized. Aortic valve mean gradient measures 12.0 mmHg. Aortic valve peak gradient measures 24.4 mmHg. Aortic valve area, by VTI measures 1.76 cm. Pulmonic Valve: The pulmonic valve was not well visualized. Pulmonic valve regurgitation is not visualized. Aorta: The aortic root and ascending aorta are structurally normal, with no evidence of dilitation. Venous: The inferior vena cava is normal in size with greater than 50% respiratory variability, suggesting right atrial pressure of 3 mmHg. IAS/Shunts: The interatrial septum was not well visualized.  LEFT VENTRICLE PLAX 2D LVIDd:         5.60 cm      Diastology LVIDs:         4.15 cm      LV e' medial:  8.70 cm/s LV PW:         1.30 cm      LV e' lateral: 14.10 cm/s LV IVS:        1.00 cm LVOT diam:     2.10 cm LV SV:         83 LV SV Index:   41 LVOT Area:     3.46 cm  LV Volumes (MOD) LV vol d, MOD A2C: 162.0 ml LV vol d, MOD A4C: 182.0 ml LV vol s, MOD A2C: 69.8 ml LV vol s, MOD A4C: 79.8 ml LV SV MOD A2C:     92.2 ml LV SV MOD A4C:     182.0 ml LV SV MOD BP:      99.5 ml RIGHT VENTRICLE RV S prime:     15.70 cm/s TAPSE (M-mode): 2.8 cm LEFT ATRIUM            Index        RIGHT ATRIUM           Index LA diam:      4.00 cm  1.98 cm/m   RA Area:     17.50 cm LA Vol (A2C): 57.0 ml  28.20 ml/m  RA Volume:   45.30 ml  22.41 ml/m LA Vol (A4C): 105.0 ml 51.95 ml/m  AORTIC VALVE AV Area (Vmax):    1.88 cm AV Area (Vmean):   1.89 cm AV Area (VTI):     1.76 cm AV Vmax:           247.00 cm/s AV Vmean:  160.000 cm/s AV VTI:            0.470 m AV Peak Grad:      24.4 mmHg AV Mean Grad:      12.0 mmHg LVOT Vmax:         134.00 cm/s LVOT Vmean:        87.500 cm/s LVOT VTI:          0.239 m LVOT/AV VTI ratio: 0.51  AORTA Ao Root diam: 3.40 cm Ao Asc diam:  3.40 cm MITRAL VALVE               TRICUSPID VALVE MV Area (PHT): 3.42 cm    TR Peak grad:   21.9 mmHg MV Decel Time: 222 msec     TR Vmax:        234.00 cm/s MV A velocity: 80.30 cm/s                            SHUNTS                            Systemic VTI:  0.24 m                            Systemic Diam: 2.10 cm Phineas Inches Electronically signed by Phineas Inches Signature Date/Time: 03/26/2022/1:35:39 PM    Final    CARDIAC CATHETERIZATION  Result Date: 03/25/2022   Dist LAD lesion is 10% stenosed.   1st Mrg lesion is 30% stenosed. 1.  Diffuse mild to moderate obstructive disease with patent mid LAD stent. 2.  Elevated LVEDP of 25 mmHg. Commendation: Continued medical therapy.   DG CHEST PORT 1 VIEW  Result Date: 03/24/2022 CLINICAL DATA:  Shortness of breath post endoscopy EXAM: PORTABLE CHEST 1 VIEW COMPARISON:  Previous studies including the examination done earlier today FINDINGS: Tip of endotracheal tube is 3.8 cm above the carina. There is almost complete opacification of left hemithorax. Central pulmonary vessels are prominent in the right lung. Increased interstitial markings are seen in right mid and right lower lung fields. IMPRESSION: There is interval almost complete opacification of left hemithorax suggesting atelectasis/pneumonia or aspiration. Central pulmonary vessels in right lung appear prominent, possibly suggesting CHF. Increased interstitial markings in right parahilar region and right lower lung field may suggest interstitial edema or pneumonia. These results will be called to the ordering clinician or representative by the Radiologist Assistant, and communication documented in the PACS or [employer]. Electronically Signed   By: Elmer Picker M.D.   On: 03/24/2022 16:58   CT Head Wo Contrast  Result Date: 03/24/2022 CLINICAL DATA:  Head trauma with ataxia EXAM: CT HEAD WITHOUT CONTRAST CT CERVICAL SPINE WITHOUT CONTRAST TECHNIQUE: Multidetector CT imaging of the head and cervical spine was performed following the standard protocol without intravenous contrast. Multiplanar CT image reconstructions  of the cervical spine were also generated. RADIATION DOSE REDUCTION: This exam was performed according to the departmental dose-optimization program which includes automated exposure control, adjustment of the mA and/or kV according to patient size and/or use of iterative reconstruction technique. COMPARISON:  None Available. FINDINGS: CT HEAD FINDINGS Brain: No evidence of acute infarction, hemorrhage, hydrocephalus, extra-axial collection or mass lesion/mass effect. Generalized cerebral volume loss and patchy chronic small vessel ischemia in the hemispheric white matter. Vascular: Atheromatous calcification Skull: No acute fracture Sinuses/Orbits: No evidence of injury.  Right enucleation with prosthesis. Left cataract resection. CT CERVICAL SPINE FINDINGS Alignment: Normal Skull base and vertebrae: Chronic marginated lucency through the right C1 lateral mass at the atlantooccipital joint. No acute fracture or aggressive bone lesion. Soft tissues and spinal canal: No prevertebral fluid or swelling. No visible canal hematoma. Disc levels: Multilevel spondylitic spurring. Milder facet spurring with C2-3 ankylosis. Upper chest: No visible injury IMPRESSION: No evidence of acute intracranial or cervical spine injury. Electronically Signed   By: Jorje Guild M.D.   On: 03/24/2022 04:49   CT Cervical Spine Wo Contrast  Result Date: 03/24/2022 CLINICAL DATA:  Head trauma with ataxia EXAM: CT HEAD WITHOUT CONTRAST CT CERVICAL SPINE WITHOUT CONTRAST TECHNIQUE: Multidetector CT imaging of the head and cervical spine was performed following the standard protocol without intravenous contrast. Multiplanar CT image reconstructions of the cervical spine were also generated. RADIATION DOSE REDUCTION: This exam was performed according to the departmental dose-optimization program which includes automated exposure control, adjustment of the mA and/or kV according to patient size and/or use of iterative reconstruction  technique. COMPARISON:  None Available. FINDINGS: CT HEAD FINDINGS Brain: No evidence of acute infarction, hemorrhage, hydrocephalus, extra-axial collection or mass lesion/mass effect. Generalized cerebral volume loss and patchy chronic small vessel ischemia in the hemispheric white matter. Vascular: Atheromatous calcification Skull: No acute fracture Sinuses/Orbits: No evidence of injury. Right enucleation with prosthesis. Left cataract resection. CT CERVICAL SPINE FINDINGS Alignment: Normal Skull base and vertebrae: Chronic marginated lucency through the right C1 lateral mass at the atlantooccipital joint. No acute fracture or aggressive bone lesion. Soft tissues and spinal canal: No prevertebral fluid or swelling. No visible canal hematoma. Disc levels: Multilevel spondylitic spurring. Milder facet spurring with C2-3 ankylosis. Upper chest: No visible injury IMPRESSION: No evidence of acute intracranial or cervical spine injury. Electronically Signed   By: Jorje Guild M.D.   On: 03/24/2022 04:49   DG Elbow 2 Views Right  Result Date: 03/24/2022 CLINICAL DATA:  Level 2 trauma, fall on blood thinners EXAM: RIGHT ELBOW - 2 VIEW COMPARISON:  None Available. FINDINGS: There is no evidence of fracture, dislocation, or joint effusion. There is no evidence of arthropathy or other focal bone abnormality. Soft tissues are unremarkable. IMPRESSION: Negative. Electronically Signed   By: Jorje Guild M.D.   On: 03/24/2022 04:44   DG Chest Portable 1 View  Result Date: 03/24/2022 CLINICAL DATA:  Level 2 fall on blood thinners EXAM: PORTABLE CHEST 1 VIEW COMPARISON:  10/12/2020 FINDINGS: Hyperinflation. There is no edema, consolidation, effusion, or pneumothorax. Normal heart size when accounting for mediastinal fat. Stable mediastinal contours. Artifact from EKG leads. IMPRESSION: 1. No acute finding. 2. Hyperinflation. Electronically Signed   By: Jorje Guild M.D.   On: 03/24/2022 04:44     Labs:  CBC: Recent Labs    03/24/22 1410 03/24/22 2139 03/25/22 0517 03/25/22 1943 03/26/22 0339 03/27/22 0424  WBC 6.7  --  6.3 13.8* 10.9*  --   HGB 8.1*   < > 7.3* 8.7* 8.8* 8.5*  HCT 26.5*   < > 21.5* 27.5* 27.8* 27.6*  PLT 226  --  209 314 311  --    < > = values in this interval not displayed.    COAGS: No results for input(s): "INR", "APTT" in the last 8760 hours.  BMP: Recent Labs    03/25/22 0517 03/26/22 0339 03/27/22 0424 03/28/22 0243  NA 131* 138 138 139  K 3.8 3.8 3.9 4.3  CL 100 104 106 107  CO2 18* '22 22 23  '$ GLUCOSE 142* 113* 101* 101*  BUN 31* 35* 29* 27*  CALCIUM 7.7* 9.1 8.5* 8.4*  CREATININE 1.82* 1.82* 1.13 1.16  GFRNONAA 38* 38* >60 >60    LIVER FUNCTION TESTS: Recent Labs    08/10/21 1520 03/24/22 0415  BILITOT 0.3 0.2*  AST 14 31  ALT 13 22  ALKPHOS 94 71  PROT 7.0 5.5*  ALBUMIN 4.0 3.1*    Assessment and Plan:  Mr. Brockmann is a 76 year old male who was admitted after VF arrest in ED associated with acute blood loss in the setting of an UGI bleed.  He has a history of PE and DVT.  Full anticoagulation is deemed too risky at this time.  ASA may be resumed, but IR consulted for IVC filter for thrombus protection.   Visiting with patient, he would like to proceed with procedure and is NPO with the exception of sips with meds.  Risks and benefits discussed with the patient including, but not limited to bleeding, infection, contrast induced renal failure, filter fracture or migration which can lead to emergency surgery or even death, strut penetration with damage or irritation to adjacent structures and caval thrombosis.  All of the patient's questions were answered, patient is agreeable to proceed. Consent signed and in IR.   Thank you for this interesting consult.  I greatly enjoyed meeting RONEY YOUTZ and look forward to participating in their care.  A copy of this report was sent to the requesting provider on this  date.  Electronically Signed: Pasty Spillers, PA 03/28/2022, 10:14 AM   I spent a total of 40 Minutes in face to face in clinical consultation, greater than 50% of which was counseling/coordinating care for IVC filter placement

## 2022-03-29 ENCOUNTER — Inpatient Hospital Stay (HOSPITAL_COMMUNITY): Payer: Medicare Other

## 2022-03-29 DIAGNOSIS — Z86711 Personal history of pulmonary embolism: Secondary | ICD-10-CM | POA: Diagnosis not present

## 2022-03-29 DIAGNOSIS — K31819 Angiodysplasia of stomach and duodenum without bleeding: Secondary | ICD-10-CM | POA: Diagnosis not present

## 2022-03-29 DIAGNOSIS — I5021 Acute systolic (congestive) heart failure: Secondary | ICD-10-CM | POA: Diagnosis not present

## 2022-03-29 DIAGNOSIS — I4901 Ventricular fibrillation: Secondary | ICD-10-CM | POA: Diagnosis not present

## 2022-03-29 DIAGNOSIS — K31811 Angiodysplasia of stomach and duodenum with bleeding: Secondary | ICD-10-CM | POA: Diagnosis not present

## 2022-03-29 DIAGNOSIS — N179 Acute kidney failure, unspecified: Secondary | ICD-10-CM | POA: Diagnosis not present

## 2022-03-29 DIAGNOSIS — I5033 Acute on chronic diastolic (congestive) heart failure: Secondary | ICD-10-CM | POA: Diagnosis not present

## 2022-03-29 HISTORY — PX: IR IVC FILTER PLMT / S&I /IMG GUID/MOD SED: IMG701

## 2022-03-29 LAB — POCT I-STAT 7, (LYTES, BLD GAS, ICA,H+H)
Acid-base deficit: 6 mmol/L — ABNORMAL HIGH (ref 0.0–2.0)
Bicarbonate: 20.1 mmol/L (ref 20.0–28.0)
Calcium, Ion: 1.08 mmol/L — ABNORMAL LOW (ref 1.15–1.40)
HCT: 25 % — ABNORMAL LOW (ref 39.0–52.0)
Hemoglobin: 8.5 g/dL — ABNORMAL LOW (ref 13.0–17.0)
O2 Saturation: 100 %
Potassium: 4.1 mmol/L (ref 3.5–5.1)
Sodium: 134 mmol/L — ABNORMAL LOW (ref 135–145)
TCO2: 21 mmol/L — ABNORMAL LOW (ref 22–32)
pCO2 arterial: 38.9 mmHg (ref 32–48)
pH, Arterial: 7.32 — ABNORMAL LOW (ref 7.35–7.45)
pO2, Arterial: 366 mmHg — ABNORMAL HIGH (ref 83–108)

## 2022-03-29 LAB — BASIC METABOLIC PANEL
Anion gap: 10 (ref 5–15)
BUN: 25 mg/dL — ABNORMAL HIGH (ref 8–23)
CO2: 23 mmol/L (ref 22–32)
Calcium: 8.2 mg/dL — ABNORMAL LOW (ref 8.9–10.3)
Chloride: 107 mmol/L (ref 98–111)
Creatinine, Ser: 1.08 mg/dL (ref 0.61–1.24)
GFR, Estimated: >60 mL/min (ref >60)
Glucose, Bld: 96 mg/dL (ref 70–99)
Potassium: 4 mmol/L (ref 3.5–5.1)
Sodium: 140 mmol/L (ref 135–145)

## 2022-03-29 LAB — GLUCOSE, CAPILLARY
Glucose-Capillary: 101 mg/dL — ABNORMAL HIGH (ref 70–99)
Glucose-Capillary: 110 mg/dL — ABNORMAL HIGH (ref 70–99)
Glucose-Capillary: 108 mg/dL — ABNORMAL HIGH (ref 70–99)
Glucose-Capillary: 110 mg/dL — ABNORMAL HIGH (ref 70–99)

## 2022-03-29 MED ORDER — MIDAZOLAM HCL 2 MG/2ML IJ SOLN
INTRAMUSCULAR | Status: AC
  Filled 2022-03-29: qty 2

## 2022-03-29 MED ORDER — FENTANYL CITRATE (PF) 100 MCG/2ML IJ SOLN
INTRAMUSCULAR | Status: AC
  Filled 2022-03-29: qty 2

## 2022-03-29 MED ORDER — IRBESARTAN 150 MG PO TABS
300.0000 mg | ORAL_TABLET | Freq: Every day | ORAL | Status: DC
  Administered 2022-03-29 – 2022-03-31 (×3): 300 mg via ORAL
  Filled 2022-03-29 (×3): qty 2

## 2022-03-29 MED ORDER — HYDROCHLOROTHIAZIDE 25 MG PO TABS
25.0000 mg | ORAL_TABLET | Freq: Every day | ORAL | Status: DC
  Administered 2022-03-29: 25 mg via ORAL
  Filled 2022-03-29: qty 1

## 2022-03-29 MED ORDER — MIDAZOLAM HCL 2 MG/2ML IJ SOLN
INTRAMUSCULAR | Status: AC | PRN
  Administered 2022-03-29: 1 mg via INTRAVENOUS
  Administered 2022-03-29: .5 mg via INTRAVENOUS

## 2022-03-29 MED ORDER — LIDOCAINE HCL 1 % IJ SOLN
INTRAMUSCULAR | Status: AC
  Filled 2022-03-29: qty 20

## 2022-03-29 MED ORDER — TELMISARTAN-HCTZ 80-25 MG PO TABS: 1.0000 | ORAL_TABLET | Freq: Every day | ORAL | Status: DC

## 2022-03-29 MED ORDER — FENTANYL CITRATE (PF) 100 MCG/2ML IJ SOLN
INTRAMUSCULAR | Status: AC | PRN
  Administered 2022-03-29: 25 ug via INTRAVENOUS

## 2022-03-29 MED ORDER — IOHEXOL 300 MG/ML  SOLN
150.0000 mL | Freq: Once | INTRAMUSCULAR | Status: AC | PRN
  Administered 2022-03-29: 40 mL via INTRAVENOUS

## 2022-03-29 NOTE — Progress Notes (Addendum)
Rounding Note    Patient Name: Aaron [name] Date of Encounter: 03/29/2022  [place] Cardiologist: Peter Martinique, MD   Subjective   Patient sitting up in bed this morning.  He is understandably frustrated that his IVC filter placement procedure was canceled last minute yesterday.  Otherwise reports that he is doing well.  States that he urinated quite a bit yesterday after receiving IV Lasix.  He is quite happy that swelling in his legs is improved today.  Continues to have reproducible chest wall pain status post CPR.  Denies other chest pain and shortness of breath.  Inpatient Medications    Scheduled Meds:  amLODipine  5 mg Oral QHS   aspirin EC  81 mg Oral Daily   atorvastatin  80 mg Oral Daily   clonazePAM  0.5 mg Oral QHS   empagliflozin  10 mg Oral Daily   ezetimibe  10 mg Oral Daily   ferrous sulfate  325 mg Oral Q breakfast   furosemide  40 mg Intravenous Daily   insulin aspart  0-15 Units Subcutaneous TID WC   melatonin  3 mg Oral Once   pantoprazole  40 mg Oral Daily   polyethylene glycol  17 g Oral BID   sodium chloride flush  3 mL Intravenous Q12H   traZODone  50 mg Oral QHS   venlafaxine XR  150 mg Oral Q breakfast   Continuous Infusions:  sodium chloride 10 mL/hr at 03/25/22 1200   sodium chloride     PRN Meds: sodium chloride, sodium chloride, acetaminophen, ALPRAZolam, alum & mag hydroxide-simeth, docusate sodium, HYDROcodone-acetaminophen, HYDROmorphone (DILAUDID) injection, ondansetron (ZOFRAN) IV, sodium chloride flush   Vital Signs    Vitals:   03/28/22 0818 03/28/22 1218 03/28/22 2045 03/29/22 0522  BP: (!) 142/67 (!) 161/61 (!) 148/68 (!) 173/79  Pulse: 71 73 79 76  Resp: '18 18 18 18  '$ Temp: 97.8 F (36.6 C) 97.9 F (36.6 C) 98.1 F (36.7 C) 98.6 F (37 C)  TempSrc: Oral Oral Oral Oral  SpO2: 99% 100% 100% 99%  Weight:      Height:        Intake/Output Summary (Last 24 hours) at 03/29/2022 0752 Last data filed at 03/29/2022  0522 Gross per 24 hour  Intake --  Output 700 ml  Net -700 ml      03/28/2022    5:33 AM 03/27/2022    6:36 AM 03/26/2022    4:20 AM  Last 3 Weights  Weight (lbs) 247 lb 9.6 oz 248 lb 205 lb 1.6 oz  Weight (kg) 112.311 kg 112.492 kg 93.033 kg      Telemetry    Sinus rhythm- Personally Reviewed  ECG    No ECG today  Physical Exam   GEN: No acute distress.   Neck: No JVD Cardiac: RRR, no murmurs, rubs, or gallops.  Respiratory: Clear to auscultation bilaterally. GI: Soft, nontender, non-distended  MS: Trace lower extremity edema; No deformity. Neuro:  Nonfocal  Psych: Normal affect   Labs    High Sensitivity Troponin:   Recent Labs  Lab 03/24/22 0415 03/24/22 0631  TROPONINIHS 5,431* 3,894*     Chemistry Recent Labs  Lab 03/24/22 0415 03/25/22 0517 03/26/22 0339 03/27/22 0424 03/28/22 0243 03/29/22 0222  NA 131* 131*   < > 138 139 140  K 4.0 3.8   < > 3.9 4.3 4.0  CL 103 100   < > 106 107 107  CO2 19* 18*   < >  $'22 23 23  'P$ GLUCOSE 122* 142*   < > 101* 101* 96  BUN 28* 31*   < > 29* 27* 25*  CREATININE 1.91* 1.82*   < > 1.13 1.16 1.08  CALCIUM 8.1* 7.7*   < > 8.5* 8.4* 8.2*  MG  --  2.1  --   --   --   --   PROT 5.5*  --   --   --   --   --   ALBUMIN 3.1*  --   --   --   --   --   AST 31  --   --   --   --   --   ALT 22  --   --   --   --   --   ALKPHOS 71  --   --   --   --   --   BILITOT 0.2*  --   --   --   --   --   GFRNONAA 36* 38*   < > >60 >60 >60  ANIONGAP 9 13   < > '10 9 10   '$ < > = values in this interval not displayed.    Lipids  Recent Labs  Lab 03/25/22 0517  TRIG 823*    Hematology Recent Labs  Lab 03/25/22 0517 03/25/22 1943 03/26/22 0339 03/27/22 0424  WBC 6.3 13.8* 10.9*  --   RBC 2.53* 3.27* 3.28*  --   HGB 7.3* 8.7* 8.8* 8.5*  HCT 21.5* 27.5* 27.8* 27.6*  MCV 85.0 84.1 84.8  --   MCH 28.9 26.6 26.8  --   MCHC 34.0 31.6 31.7  --   RDW 16.3* 16.0* 16.2*  --   PLT 209 314 311  --    Thyroid No results for input(s):  "TSH", "FREET4" in the last 168 hours.  BNP Recent Labs  Lab 03/24/22 0415 03/28/22 0243  BNP 178.8* 167.3*    DDimer No results for input(s): "DDIMER" in the last 168 hours.   Radiology    No results found.  Cardiac Studies   03/26/2022 TTE   Sonographer Comments: Image acquisition challenging due to patient body  habitus and Image acquisition challenging due to respiratory motion.  Patient was in too much pain from resucitation performed night before to  [facility] well.  IMPRESSIONS     1. Study not well visualized to full assess endocardial borders. Left  ventricular ejection fraction, by estimation, is 55 to 60%. The left  ventricle has normal function. The left ventricle has no regional wall  motion abnormalities. There is mild left  ventricular hypertrophy. Left ventricular diastolic parameters were  normal.   2. Right ventricular systolic function is normal. The right ventricular  size is normal. There is normal pulmonary artery systolic pressure.   3. The mitral valve is normal in structure. No evidence of mitral valve  regurgitation.   4. The aortic valve was not well visualized. Aortic valve regurgitation  is not visualized.   5. The inferior vena cava is normal in size with greater than 50%  respiratory variability, suggesting right atrial pressure of 3 mmHg.   FINDINGS   Left Ventricle: Study not well visualized to full assess endocardial  borders. Left ventricular ejection fraction, by estimation, is 55 to 60%.  The left ventricle has normal function. The left ventricle has no regional  wall motion abnormalities. The left   ventricular internal cavity size was normal in size. There is mild left  ventricular hypertrophy. Left ventricular diastolic parameters were  normal.   Right Ventricle: The right ventricular size is normal. No increase in  right ventricular wall thickness. Right ventricular systolic function is  normal. There is normal pulmonary artery  systolic pressure. The tricuspid  regurgitant velocity is 2.34 m/s, and   with an assumed right atrial pressure of 3 mmHg, the estimated right  ventricular systolic pressure is 13.2 mmHg.   Left Atrium: Left atrial size was normal in size.   Right Atrium: Right atrial size was normal in size.   Pericardium: There is no evidence of pericardial effusion.   Mitral Valve: The mitral valve is normal in structure. No evidence of  mitral valve regurgitation.   Tricuspid Valve: The tricuspid valve is normal in structure. Tricuspid  valve regurgitation is not demonstrated.   Aortic Valve: The aortic valve was not well visualized. Aortic valve  regurgitation is not visualized. Aortic valve mean gradient measures 12.0  mmHg. Aortic valve peak gradient measures 24.4 mmHg. Aortic valve area, by  VTI measures 1.76 cm.   Pulmonic Valve: The pulmonic valve was not well visualized. Pulmonic valve  regurgitation is not visualized.   Aorta: The aortic root and ascending aorta are structurally normal, with  no evidence of dilitation.   Venous: The inferior vena cava is normal in size with greater than 50%  respiratory variability, suggesting right atrial pressure of 3 mmHg.   IAS/Shunts: The interatrial septum was not well visualized.    03/25/2022 LHC     Dist LAD lesion is 10% stenosed.   1st Mrg lesion is 30% stenosed.   1.  Diffuse mild to moderate obstructive disease with patent mid LAD stent. 2.  Elevated LVEDP of 25 mmHg.   Diagnostic Dominance: Co-dominant     Patient Profile     Aaron [name] is a 76 y.o. 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 Patient presented to the ED after a ground level fall at home in the setting of worsening dyspnea, dizziness, weakness,  and pain. Initial ECG notable for ST depression. Hemoglobin noted low at 4.8. While in the ED, patient had a VF arrest, received CPR and defibrillation x1.   Assessment & Plan    S/P VF Arrest CAD Elevated troponin with demand ischemia    Patient with VF arrest while in [facility] ED. LHC on 9/22 found nonobstructive CAD and no re-stenosis of mid LAD stent. 9/23 TTE with LVEF 55-60%. Troponin 5431, [id number].   VF still suspected secondary to significant anemia, HBG 4.8. D/w EP, suspect reversible etiology in setting of severe anemia, but as he is at risk of rebleeding, would recommended Lifevest on discharge Troponin leak very likely 2/2 demand ischemia with anemia. LHC without acute findings. Resume ASA '81mg'$ , cleared with GI per Dr. Cathlean Sauer. Continue atorvastatin '80mg'$  QD  HTN  Patient with persistent hypertension this hospitalization, has been off Telmisartan/HCTZ.  Continue Amlodipine '5mg'$  QD Resume home Telmisartan/HCTZ   Acute HFpEF   9/23 TTE with LVEF 55-60%, normal RV function, RAP 3 mmHg. LVEDP 25 mmHg on 9/22 LHC. BNP 178.8. On exam today, patient with trace bilateral lower extremity edema. No JVD noted.   Patient is currently NPO for IVC filter procedure. He was given a single dose of IV lasix '40mg'$  yesterday, I/Os not reported  but patient confirms significant urine output. Resume home Telmisartan/HCTZ today   Gastric Antral Vascular Ectasia Severe acute on chronic anemia   Patient with a HGB of 4.8 on admission. Chart review shows chronic anemia over the last several year with multiple iron infusions this year. Patient was referred to GI in August 2/2 positive fecal occult blood test. 9/21 EGD with gastric antral vascular ectasia and bleeding which was successfully treated with APC. HGB 8.4 on 9/24.   Management per GI/primary hospitalist team.  Patient very likely to require additional treatments and remains at high risk of bleeding. Okay to continue without [place]. GI recommending  outpatient colonoscopy   History of recurrent DVT/PE   Patient has been on Xarelto '20mg'$  for prophylaxis. As patient likely to require additional treatment for his GI bleeding, agree with hospitalist and GI that Xarelto should be held until risk of rebleeding is lower. Patient scheduled to received IVC filter yesterday. Appears that scheduling was an issue and this has been moved to today.        For questions or updates, please contact [place] Please consult [email or web address] for contact info under        Signed, Lily Kocher, PA-C  03/29/2022, 7:52 AM     Patient seen and examined.  Agree with above documentation.  On exam, patient is alert and oriented, regular rate and rhythm, no murmurs, lungs CTAB, 1+ LE edema.  Diuresed with IV Lasix 40 mg x 1 yesterday.  I/Os not recorded.  Reports feeling improved.  Will transition to his home HCTZ today as remains mildly volume overloaded and BP has been elevated.  Planning IVC filter today.  Discussed case with EP, recommended Lifevest on discharge  Donato Heinz, MD

## 2022-03-29 NOTE — Progress Notes (Signed)
Patient taken to IR at 1015hrs.

## 2022-03-29 NOTE — Progress Notes (Signed)
Physical Therapy Treatment Patient Details Name: Aaron [name] MRN: [id number] DOB: [birth year] Today's Date: 03/29/2022   History of Present Illness Pt is a 76 y.o. male admitted 03/24/22 with dizziness and fall. Workup for significant anemia, concern for GIB. Brief vfib arrest in ED. S/p upper endoscopy 9/21. S/p LHC 9/22. ETT 9/21-9/22. 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    PT Comments    Pt fatigued after procedure this morning but willing to mobilize and becomes emotional when speaking of getting back to baseline. Pt ambulated with rollator 90' with min guard A with seated rest at 45'. Pt continues to have R shoulder discomfort. AAROM exercises given to pt for ROM and prevention of adhesive capsulitis. Pt tolerated these well. Continue to recommend HHPT upon d/c. PT will continue to follow.     Recommendations for follow up therapy are one component of a multi-disciplinary discharge planning process, led by the attending physician.  Recommendations may be updated based on patient status, additional functional criteria and insurance authorization.  Follow Up Recommendations  Home health PT     Assistance Recommended at Discharge Intermittent Supervision/Assistance  Patient can return home with the following A little help with bathing/dressing/bathroom;Assistance with cooking/housework;Assist for transportation   Equipment Recommendations  None recommended by PT    Recommendations for Other Services       Precautions / Restrictions Precautions Precautions: Fall;Other (comment) Precaution Comments: R-side rib pain from CPR Restrictions Weight Bearing Restrictions: No     Mobility  Bed Mobility               General bed mobility comments: pt sitting EOB upon PT arrival    Transfers Overall transfer level: Needs assistance Equipment used: Rollator (4 wheels) Transfers: Sit  to/from Stand Sit to Stand: Supervision           General transfer comment: no physical assist needed with sit>stand, supervision for safety    Ambulation/Gait Ambulation/Gait assistance: Min guard Gait Distance (Feet): 90 Feet (1 seated rest break) Assistive device: Rollator (4 wheels) Gait Pattern/deviations: Step-through pattern, Decreased stride length, Trunk flexed Gait velocity: Decreased Gait velocity interpretation: <1.8 ft/sec, indicate of risk for recurrent falls   General Gait Details: pt needed seated rest at 45' on rollator. Practiced safely sitting to and standing from rollator.   Stairs             Wheelchair Mobility    Modified Rankin (Stroke Patients Only)       Balance Overall balance assessment: Needs assistance Sitting-balance support: No upper extremity supported Sitting balance-Leahy Scale: Good     Standing balance support: No upper extremity supported, During functional activity Standing balance-Leahy Scale: Fair Standing balance comment: static and dynamic stability improved with RW                            Cognition Arousal/Alertness: Awake/alert Behavior During Therapy: WFL for tasks assessed/performed Overall Cognitive Status: Impaired/Different from baseline Area of Impairment: Memory, Following commands, Awareness                     Memory: Decreased short-term memory Following Commands: Follows one step commands consistently   Awareness: Anticipatory            Exercises Other Exercises Other Exercises: AAROM shoulder flexion for R shoulder soreness, 10x with assist from L hand    General  Comments General comments (skin integrity, edema, etc.): SPO2 in 90's on RA, HR 70s-108 bpm      Pertinent Vitals/Pain Pain Assessment Pain Assessment: Faces Faces Pain Scale: Hurts little more Pain Location: R ribs and shoulder from fall/CPR Pain Descriptors / Indicators: Discomfort Pain  Intervention(s): Monitored during session    Home Living                          Prior Function            PT Goals (current goals can now be found in the care plan section) Acute Rehab PT Goals Patient Stated Goal: return home, willing to consider HHPT PT Goal Formulation: With patient Time For Goal Achievement: 04/09/22 Potential to Achieve Goals: Good Progress towards PT goals: Progressing toward goals    Frequency    Min 3X/week      PT Plan Current plan remains appropriate    Co-evaluation              AM-PAC PT "6 Clicks" Mobility   Outcome Measure  Help needed turning from your back to your side while in a flat bed without using bedrails?: None Help needed moving from lying on your back to sitting on the side of a flat bed without using bedrails?: A Little Help needed moving to and from a bed to a chair (including a wheelchair)?: A Little Help needed standing up from a chair using your arms (e.g., wheelchair or bedside chair)?: A Little Help needed to walk in hospital room?: A Little Help needed climbing 3-5 steps with a railing? : A Little 6 Click Score: 19    End of Session Equipment Utilized During Treatment: Gait belt Activity Tolerance: Patient tolerated treatment well Patient left: with call bell/phone within reach;in bed Nurse Communication: Mobility status PT Visit Diagnosis: Other abnormalities of gait and mobility (R26.89);Muscle weakness (generalized) (M62.81)     Time: 5361-4431 PT Time Calculation (min) (ACUTE ONLY): 29 min  Charges:  $Gait Training: 8-22 mins $Therapeutic Exercise: 8-22 mins                     Leighton Roach, PT  Acute Rehab Services Secure chat preferred Office [place] 03/29/2022, 5:02 PM

## 2022-03-29 NOTE — Procedures (Signed)
Interventional Radiology Procedure Note  Procedure: IVC filter placement  Indication: GI Bleed.  History of DVT.  Findings: Please refer to procedural dictation for full description.  Complications: None  EBL: < 10 mL  Miachel Roux, MD [phone]

## 2022-03-29 NOTE — TOC Initial Note (Addendum)
Transition of Care [facility]) - Initial/Assessment Note    Patient Details  Name: Aaron [name] MRN: [id number] Date of Birth: 29-Jul-1945  [facility] CM/SW Contact:    Bethena Roys, RN Phone Number: 03/29/2022, 4:27 PM  Clinical Narrative:  Case Manager awaiting insurance approval for Life Vest. Case Manager spoke with patient regarding disposition needs. Previous Case Manager had spoken with the patient; however, unable to arrange the services. Case Manager discussed possible HH PT and [place] RN services for medication and disease management. Patient is agreeable to services. Case Manager offered the patient choice and he did not have a preference. Case Manager called [facility] to see if they can service the patient- awaiting call back. Previous Case Manager had ordered a rollator, patient wanted to pay out of pocket for the DME rollator. Case Manager did call [employer]- Awaiting call back. Patient wants the standard rolling walker ordered for home. Case Manager will continue to follow for disposition needs.          1710 03-29-22 DME rolling walker ordered via [employer] to be delivered to the room prior to d/c.          Expected Discharge Plan: [place] Barriers to Discharge: Continued Medical Work up   Patient Goals and CMS Choice Patient states their goals for this hospitalization and ongoing recovery are:: to return home with spouse. CMS Medicare.gov Compare Post Acute Care list provided to:: Patient Choice offered to / list presented to : Patient  Expected Discharge Plan and Services Expected Discharge Plan: [place] In-house Referral: NA Discharge Planning Services: CM Consult Post Acute Care Choice: Home Health, Durable Medical Equipment Living arrangements for the past 2 months: Single Family Home                 DME Arranged: Walker rolling DME Agency: [employer] Date DME Agency Contacted: 03/29/22 Time DME  Agency Contacted: 82 Representative spoke with at DME Agency: [place] Arranged: PT [place] Agency: [place] Date [place]: 03/29/22 Time [place]: 88 Representative spoke with at [place]: [place] Arrangements/Services Living arrangements for the past 2 months: [place] Lives with:: Spouse Patient language and need for interpreter reviewed:: Yes Do you feel safe going back to the place where you live?: Yes      Need for Family Participation in Patient Care: Yes (Comment) Care giver support system in place?: Yes (comment) Current home services: Home RN Criminal Activity/Legal Involvement Pertinent to Current Situation/Hospitalization: No - Comment as needed  Activities of Daily Living      Permission Sought/Granted Permission sought to share information with : Case Manager, Customer service manager, Family Supports Permission granted to share information with : Yes, Verbal Permission Granted     Permission granted to share info w AGENCY: [place]        Emotional Assessment Appearance:: Appears stated age Attitude/Demeanor/Rapport: Engaged Affect (typically observed): Appropriate Orientation: : Oriented to Place, Oriented to Self, Oriented to  Time, Oriented to Situation Alcohol / Substance Use: Not Applicable Psych Involvement: No (comment)  Admission diagnosis:  Ventricular fibrillation ([place]) [I49.01] GI bleed [K92.2] Symptomatic anemia [D64.9] Patient Active Problem List   Diagnosis Date Noted   Depression 38/18/2993   Acute systolic heart failure ([place])    Acute kidney injury superimposed on chronic kidney disease ([place]) 03/26/2022   Pre-diabetes 03/26/2022   Acute on chronic diastolic CHF (congestive heart failure) (  [place]) 03/25/2022   GI bleed 03/24/2022   Symptomatic anemia    Ventricular fibrillation (H[facility])    GAVE (gastric antral vascular ectasia)    Psychophysiological insomnia 02/11/2022    Occult blood in stools 02/09/2022   Hypercoagulable state ([place]) 08/25/2021   Encounter for general adult medical examination with abnormal findings 12/17/2020   Primary osteoarthritis of both knees 12/17/2020   GAD (generalized anxiety disorder) 12/17/2020   History of colonic polyps 10/07/2020   Diverticular disease of colon 10/07/2020   Constipation 10/07/2020   Gastroesophageal reflux disease 10/07/2020   Iron deficiency anemia 06/15/2020   History of pulmonary embolism 05/06/2019   Chronic low back pain 11/26/2017   Class 1 obesity 08/01/2016   Primary osteoarthritis of right knee 04/04/2016   DVT (deep venous thrombosis) ([place]) 11/09/2015   Primary osteoarthritis of left knee 10/09/2015   Generalized anxiety disorder 08/20/2015   Medicare annual wellness visit, subsequent 02/11/2015   Long term current use of anticoagulant 06/14/2011   Coronary artery disease    Hypertension    GERD (gastroesophageal reflux disease)    Insomnia    Sleep apnea, obstructive    Pulmonary embolism ([place])    PCP:  [name], MD Pharmacy:   [id number] [place]Phone: [phone]: [phone] [facility] ([address] [address] [place]Phone: [phone]: [phone] [facility] ([facility]) - [place] [address]Phone: [phone]: [address]Phone: [phone]: [phone]  Readmission Risk Interventions     No data to display

## 2022-03-29 NOTE — Progress Notes (Signed)
Progress Note   Patient: Aaron [name] SNK:[id number] DOB: 04-13-1946 DOA: 03/24/2022     5 DOS: the patient was seen and examined on 03/29/2022   Brief hospital course: Aaron [name] was admitted to the hospital with the working diagnosis of VF arrest in the setting of severe anemia, acute on chronic blood loss.   76 yo male with the past medical history of coronary artery disease, DVT, PE and iron deficiency. Patient had intermittent weakness and dizziness for at least 2 months, worsening and prompting to come to the hospital. In the Ed had a V fib arrest, patient recovered spontaneous circulation. On his initial physical examination his blood pressure was 105/56, HR 73, RR 17 and 02 saturation 100%, pale, awake and alert, lungs with no wheezing, heart with S1 and S2 present and regular, abdomen with no distention, positive lower extremity edema.   Na 131, K 4,0 Cl 103 bicarbonate 19 glucose 122, bun 28 cr 1,91  BNP 178  High sensitive troponin 5,431 Wbc 5,9 hgb 4,8 plt 219   Patient was admitted to the intensive care unit on invasive mechanical ventilation. Received PRBC transfusions # 3 09/21 EGD with gastric antral vascular ectasia with bleeding, treated with Argon plasma coagulation with success  09/22 coronary angiography with mild to moderate obstructive coronary artery diease. 09/22 patient extubated.   09/23 transfer to [facility]   09/24 patient hemodynamically stable, to high risk to resume full anticoagulation. Plan to resume low dose aspirin and placed IVC filter.  09/25 IR Consulted for IVC filter placement.  09/26 IVC filter placement. Patient will need a lifeVest before discharge.   Assessment and Plan: * Ventricular fibrillation (HCC) Cardiac arrest Vfib, due to severe anemia due to acute blood loss anemia, due to upper GI bleed.   Patient with hemodynamic stability after 3 units PRBC transfusion.  Follow up hgb 8.5 Plan to check cell count in am.   Echocardiogram with  preserved LV systolic function with no regional wall motion abnormalities Coronary angiography with no significant coronary artery disease.   GAVE (gastric antral vascular ectasia) Patient is at a very high risk for re bleeding.  GAVE will need subsequent treatments and healing process is prolonged.  Will need frequent cell count monitoring.   Continue with proton pump inhibition and follow up with GI as outpatient.   Acute on chronic diastolic CHF (congestive heart failure) (HCC) Echocardiogram with preserved LV systolic function with EF 55 to 60%, mild LVH, RV with preserved systolic function. No significant valvular disease.    Continue with amlodipine for blood pressure control. Patient will benefit from SGLT 2 inh and possible transition to ARB when renal function more stable.   (mild reduced LV systolic function systolic and diastolic heart failure)  Urine output is documented at 700 cc Continue with lower extremity edema. Systolic blood pressure is 164 to 170 mmHg.   Plan to continue with empagliflozin and furosemide.    History of pulmonary embolism CT chest from 2012 with bilateral pulmonary embolism with significant clot burden, right ventricular strain pattern.   2015 acute occlusive deep vein thrombosis involving peroneal and posterior tibial veins of the left lower extremity. Acute non occlusive deep vein thrombosis noted in the popliteal vein of the left lower extremity.   Considering his persistent iron deficiency anemia, high risk of bleeding and recurrent thrombosis,  SP IVC filer to keep in place while treatment of GAVE.   Potentially in the future the filter can be removed and patient  placed back on anticoagulation.  Currently resuming anticoagulation may be to high risk. Cased discussed with patient and Dr Gardiner Rhyme from cardiology.   Patient has been placed on low dose aspiring for coronary artery disease.   Acute kidney injury superimposed on chronic kidney  disease (HCC) CKD stage 2 , hyponatremia   Serum cr today is 1,0 with K at 4,0 and serum bicarbonate at 23. Plan to continue diuresis with furosemide and empagliflozin    Hypertension Continue blood pressure control with amlodipine, added HCTZ/ irbersartan combination today.   Pre-diabetes Continue glucose cover and monitoring with insulin sliding scale.  Fasting glucose this am is 101 mg/dl.   Depression Anxiety.  Plan to continue with venlafaxine, clonazepam and trazodone PRN alprazolam during his hospitalization   Class 1 obesity Calculated BMI is 33,1         Subjective: Patient with no chest pain or dyspnea, lower extremity edema has been improving, chest pain has improved with analgesics   Physical Exam: Vitals:   03/29/22 1236 03/29/22 1306 03/29/22 1336 03/29/22 1400  BP: (!) 137/97 (!) 179/64 (!) 172/68 (!) 164/71  Pulse: 75 67 79 87  Resp: '18 18 18 18  '$ Temp:      TempSrc:      SpO2:      Weight:      Height:       Neurology awake and alert ENT with mild pallor Cardiovascular with S1 and S2 present and rhythmic with no gallops, rubs or murmurs Respiratory with no rales or wheezing  Abdomen soft Positive lower extremity edema ++ pitting bilaterally  Data Reviewed:    Family Communication: no family at the bedside   Disposition: Status is: Inpatient Remains inpatient appropriate because: heart failure   Planned Discharge Destination: Home    Author: Tawni Millers, MD 03/29/2022 2:51 PM  For on call review [email or web address].

## 2022-03-29 NOTE — Care Management (Signed)
  Transition of Care [facility]) Screening Note   Patient Details  Name: Aaron [name] Date of Birth: 07-29-1945   Transition of Care [facility]) CM/SW Contact:    Bethena Roys, RN Phone Number: 03/29/2022, 11:16 AM    Transition of Care Department [facility]) has reviewed the patient. Case Manager received DME notification for a Life Vest. Information submitted to [place] for Life vest. Awaiting Insurance authorization and approval.

## 2022-03-29 NOTE — Progress Notes (Signed)
   Dr. Gardiner Rhyme discussed with EP and plan is for LifeVest at discharge given VF arrest with non-obstructive CAD on cardiac catheterization. Order has been placed and [employer] Rep is aware.  Darreld Mclean, PA-C 03/29/2022 4:51 PM

## 2022-03-29 NOTE — Progress Notes (Signed)
Patient returned from IR at 1228hrs. Dressing to right neck clean dry and intact.  Advised of bedrest orders.

## 2022-03-30 DIAGNOSIS — I5033 Acute on chronic diastolic (congestive) heart failure: Secondary | ICD-10-CM | POA: Diagnosis not present

## 2022-03-30 DIAGNOSIS — D649 Anemia, unspecified: Secondary | ICD-10-CM | POA: Diagnosis not present

## 2022-03-30 DIAGNOSIS — I4901 Ventricular fibrillation: Secondary | ICD-10-CM | POA: Diagnosis not present

## 2022-03-30 LAB — BASIC METABOLIC PANEL
Anion gap: 9 (ref 5–15)
BUN: 17 mg/dL (ref 8–23)
CO2: 25 mmol/L (ref 22–32)
Calcium: 8.6 mg/dL — ABNORMAL LOW (ref 8.9–10.3)
Chloride: 104 mmol/L (ref 98–111)
Creatinine, Ser: 0.9 mg/dL (ref 0.61–1.24)
GFR, Estimated: >60 mL/min (ref >60)
Glucose, Bld: 102 mg/dL — ABNORMAL HIGH (ref 70–99)
Potassium: 3.8 mmol/L (ref 3.5–5.1)
Sodium: 138 mmol/L (ref 135–145)

## 2022-03-30 LAB — CBC
HCT: 27.7 % — ABNORMAL LOW (ref 39.0–52.0)
Hemoglobin: 8.5 g/dL — ABNORMAL LOW (ref 13.0–17.0)
MCH: 26.1 pg (ref 26.0–34.0)
MCHC: 30.7 g/dL (ref 30.0–36.0)
MCV: 85 fL (ref 80.0–100.0)
Platelets: 317 10*3/uL (ref 150–400)
RBC: 3.26 MIL/uL — ABNORMAL LOW (ref 4.22–5.81)
RDW: 15.8 % — ABNORMAL HIGH (ref 11.5–15.5)
WBC: 5.9 10*3/uL (ref 4.0–10.5)
nRBC: 0 % (ref 0.0–0.2)

## 2022-03-30 LAB — GLUCOSE, CAPILLARY
Glucose-Capillary: 108 mg/dL — ABNORMAL HIGH (ref 70–99)
Glucose-Capillary: 123 mg/dL — ABNORMAL HIGH (ref 70–99)
Glucose-Capillary: 137 mg/dL — ABNORMAL HIGH (ref 70–99)
Glucose-Capillary: 106 mg/dL — ABNORMAL HIGH (ref 70–99)

## 2022-03-30 LAB — POTASSIUM: Potassium: 4.7 mmol/L (ref 3.5–5.1)

## 2022-03-30 MED ORDER — FUROSEMIDE 10 MG/ML IJ SOLN
40.0000 mg | Freq: Two times a day (BID) | INTRAMUSCULAR | Status: AC
  Administered 2022-03-30 (×2): 40 mg via INTRAVENOUS
  Filled 2022-03-30 (×2): qty 4

## 2022-03-30 MED ORDER — SPIRONOLACTONE 25 MG PO TABS
25.0000 mg | ORAL_TABLET | Freq: Every day | ORAL | Status: DC
  Administered 2022-03-30 – 2022-04-04 (×6): 25 mg via ORAL
  Filled 2022-03-30 (×6): qty 1

## 2022-03-30 MED ORDER — CARVEDILOL 6.25 MG PO TABS: 6.2500 mg | ORAL_TABLET | Freq: Two times a day (BID) | ORAL | Status: DC

## 2022-03-30 MED ORDER — POTASSIUM CHLORIDE CRYS ER 20 MEQ PO TBCR
40.0000 meq | EXTENDED_RELEASE_TABLET | Freq: Once | ORAL | Status: AC
  Administered 2022-03-30: 40 meq via ORAL
  Filled 2022-03-30: qty 2

## 2022-03-30 NOTE — Progress Notes (Signed)
OT Cancellation Note  Patient Details Name: Aaron [name] MRN: [id number] DOB: April 09, 1946   Cancelled Treatment:    Reason Eval/Treat Not Completed: Patient declined, no reason specified, attempted in AM, RN asking to hold therapy until afternoon, followed up this afternoon, pt sound asleep, pt's spouse requesting therapy return tomorrow.  Lynnda Child, OTD, OTR/L Acute Rehab [phone]  Kaylyn Lim 03/30/2022, 2:51 PM

## 2022-03-30 NOTE — Progress Notes (Addendum)
Rounding Note    Patient Name: Aaron [name] Date of Encounter: 03/30/2022  [place] Cardiologist: Peter Martinique, MD   Subjective   Patient resting comfortably in bed. His wife is at the bedside. Patient is feeling overall well today. Continues to notice some lower extremity edema but denies dyspnea/orthopnea, chest pain.   Inpatient Medications    Scheduled Meds:  amLODipine  5 mg Oral QHS   aspirin EC  81 mg Oral Daily   atorvastatin  80 mg Oral Daily   clonazePAM  0.5 mg Oral QHS   empagliflozin  10 mg Oral Daily   ezetimibe  10 mg Oral Daily   ferrous sulfate  325 mg Oral Q breakfast   furosemide  40 mg Intravenous BID   insulin aspart  0-15 Units Subcutaneous TID WC   irbesartan  300 mg Oral Daily   melatonin  3 mg Oral Once   pantoprazole  40 mg Oral Daily   polyethylene glycol  17 g Oral BID   potassium chloride  40 mEq Oral Once   sodium chloride flush  3 mL Intravenous Q12H   traZODone  50 mg Oral QHS   venlafaxine XR  150 mg Oral Q breakfast   Continuous Infusions:  sodium chloride 10 mL/hr at 03/25/22 1200   sodium chloride     PRN Meds: sodium chloride, sodium chloride, acetaminophen, ALPRAZolam, alum & mag hydroxide-simeth, docusate sodium, HYDROcodone-acetaminophen, HYDROmorphone (DILAUDID) injection, ondansetron (ZOFRAN) IV, sodium chloride flush   Vital Signs    Vitals:   03/29/22 1723 03/29/22 1952 03/29/22 2104 03/30/22 0715  BP: (!) 151/68  (!) 157/69 (!) 155/70  Pulse: 87   76  Resp: '18 18  16  '$ Temp: 98.6 F (37 C) 98 F (36.7 C)  99.1 F (37.3 C)  TempSrc: Oral Oral  Oral  SpO2:      Weight:    109.3 kg  Height:        Intake/Output Summary (Last 24 hours) at 03/30/2022 0750 Last data filed at 03/30/2022 0700 Gross per 24 hour  Intake 360 ml  Output 950 ml  Net -590 ml      03/30/2022    7:15 AM 03/28/2022    5:33 AM 03/27/2022    6:36 AM  Last 3 Weights  Weight (lbs) 241 lb 247 lb 9.6 oz 248 lb  Weight (kg)  109.317 kg 112.311 kg 112.492 kg      Telemetry    Sinus rhythm - Personally Reviewed  ECG    No ECG today  Physical Exam   GEN: No acute distress.   Neck: No JVD Cardiac: RRR, no murmurs, rubs, or gallops.  Respiratory: Clear to auscultation bilaterally. GI: Soft, nontender, non-distended  MS: Trace to 1+ lower extremity edema bilaterally, midway to knee; No deformity. Neuro:  Nonfocal  Psych: Normal affect   Labs    High Sensitivity Troponin:   Recent Labs  Lab 03/24/22 0415 03/24/22 0631  TROPONINIHS 5,431* 3,894*     Chemistry Recent Labs  Lab 03/24/22 0415 03/25/22 0517 03/25/22 0811 03/28/22 0243 03/29/22 0222 03/30/22 0315  NA 131* 131*   < > 139 140 138  K 4.0 3.8   < > 4.3 4.0 3.8  CL 103 100   < > 107 107 104  CO2 19* 18*   < > '23 23 25  '$ GLUCOSE 122* 142*   < > 101* 96 102*  BUN 28* 31*   < > 27* 25* 17  CREATININE 1.91* 1.82*   < > 1.16 1.08 0.90  CALCIUM 8.1* 7.7*   < > 8.4* 8.2* 8.6*  MG  --  2.1  --   --   --   --   PROT 5.5*  --   --   --   --   --   ALBUMIN 3.1*  --   --   --   --   --   AST 31  --   --   --   --   --   ALT 22  --   --   --   --   --   ALKPHOS 71  --   --   --   --   --   BILITOT 0.2*  --   --   --   --   --   GFRNONAA 36* 38*   < > >60 >60 >60  ANIONGAP 9 13   < > '9 10 9   '$ < > = values in this interval not displayed.    Lipids  Recent Labs  Lab 03/25/22 0517  TRIG 823*    Hematology Recent Labs  Lab 03/25/22 1943 03/26/22 0339 03/27/22 0424 03/30/22 0315  WBC 13.8* 10.9*  --  5.9  RBC 3.27* 3.28*  --  3.26*  HGB 8.7* 8.8* 8.5* 8.5*  HCT 27.5* 27.8* 27.6* 27.7*  MCV 84.1 84.8  --  85.0  MCH 26.6 26.8  --  26.1  MCHC 31.6 31.7  --  30.7  RDW 16.0* 16.2*  --  15.8*  PLT 314 311  --  317   Thyroid No results for input(s): "TSH", "FREET4" in the last 168 hours.  BNP Recent Labs  Lab 03/24/22 0415 03/28/22 0243  BNP 178.8* 167.3*    DDimer No results for input(s): "DDIMER" in the last 168 hours.    Radiology    IR IVC FILTER PLMT / S&I Burke Keels GUID/MOD SED  Result Date: 03/29/2022 INDICATION: 76 year old gentleman with history of prior DVT and pulmonary embolism recently admitted for upper GI bleed. He presents to IR for IVC filter placement. EXAM: Ultrasound-guided access of right internal jugular vein Infrarenal retrievable IVC filter placement MEDICATIONS: None. ANESTHESIA/SEDATION: Moderate (conscious) sedation was employed during this procedure. A total of Versed 1.5 mg and Fentanyl 25 mcg was administered intravenously by the radiology nurse. Total intra-service moderate Sedation Time: 12 minutes. The patient's level of consciousness and vital signs were monitored continuously by radiology nursing throughout the procedure under my direct supervision. FLUOROSCOPY: Radiation Exposure Index (as provided by the fluoroscopic device): 956 mGy Kerma COMPLICATIONS: None immediate. PROCEDURE: Informed written consent was obtained from the patient after a thorough discussion of the procedural risks, benefits and alternatives. All questions were addressed. Maximal Sterile Barrier Technique was utilized including caps, mask, sterile gowns, sterile gloves, sterile drape, hand hygiene and skin antiseptic. A timeout was performed prior to the initiation of the procedure. Patient positioned supine on the angiography table. Right neck prepped and draped in usual sterile fashion. All elements of maximal sterile barrier were utilized including, cap, mask, sterile gown, sterile gloves, large sterile drape, hand scrubbing and 2% Chlorhexidine for skin cleaning. The right internal jugular vein was evaluated with ultrasound and shown to be patent. A permanent ultrasound image was obtained and placed in the patient's medical record. Using sterile gel and a sterile probe cover, the right internal jugular vein was entered with a 21 ga needle during real time ultrasound guidance. 21  gauge needle exchanged for a transitional  dilator set over 0.018 inch guidewire. Transitional dilator set exchanged for 10 fr sheath over 0.035 inch guidewire. Sheath placed to the infrarenal IVC and venogram performed to delineate the position of the renal veins. Retrievable Denali IVC filter deployed in the infrarenal location. Sheath removed and hemostasis achieved with manual compression. IMPRESSION: Retrievable IVC filter placement. PLAN: This IVC filter is potentially retrievable. The patient will be assessed for filter retrieval by Interventional Radiology in approximately 8-12 weeks. Further recommendations regarding filter retrieval, continued surveillance or declaration of device permanence, will be made at that time. Electronically Signed   By: Miachel Roux M.D.   On: 03/29/2022 14:50    Cardiac Studies   03/26/2022 TTE   Sonographer Comments: Image acquisition challenging due to patient body  habitus and Image acquisition challenging due to respiratory motion.  Patient was in too much pain from resucitation performed night before to  [facility] well.  IMPRESSIONS     1. Study not well visualized to full assess endocardial borders. Left  ventricular ejection fraction, by estimation, is 55 to 60%. The left  ventricle has normal function. The left ventricle has no regional wall  motion abnormalities. There is mild left  ventricular hypertrophy. Left ventricular diastolic parameters were  normal.   2. Right ventricular systolic function is normal. The right ventricular  size is normal. There is normal pulmonary artery systolic pressure.   3. The mitral valve is normal in structure. No evidence of mitral valve  regurgitation.   4. The aortic valve was not well visualized. Aortic valve regurgitation  is not visualized.   5. The inferior vena cava is normal in size with greater than 50%  respiratory variability, suggesting right atrial pressure of 3 mmHg.   FINDINGS   Left Ventricle: Study not well visualized to full assess  endocardial  borders. Left ventricular ejection fraction, by estimation, is 55 to 60%.  The left ventricle has normal function. The left ventricle has no regional  wall motion abnormalities. The left   ventricular internal cavity size was normal in size. There is mild left  ventricular hypertrophy. Left ventricular diastolic parameters were  normal.   Right Ventricle: The right ventricular size is normal. No increase in  right ventricular wall thickness. Right ventricular systolic function is  normal. There is normal pulmonary artery systolic pressure. The tricuspid  regurgitant velocity is 2.34 m/s, and   with an assumed right atrial pressure of 3 mmHg, the estimated right  ventricular systolic pressure is 18.2 mmHg.   Left Atrium: Left atrial size was normal in size.   Right Atrium: Right atrial size was normal in size.   Pericardium: There is no evidence of pericardial effusion.   Mitral Valve: The mitral valve is normal in structure. No evidence of  mitral valve regurgitation.   Tricuspid Valve: The tricuspid valve is normal in structure. Tricuspid  valve regurgitation is not demonstrated.   Aortic Valve: The aortic valve was not well visualized. Aortic valve  regurgitation is not visualized. Aortic valve mean gradient measures 12.0  mmHg. Aortic valve peak gradient measures 24.4 mmHg. Aortic valve area, by  VTI measures 1.76 cm.   Pulmonic Valve: The pulmonic valve was not well visualized. Pulmonic valve  regurgitation is not visualized.   Aorta: The aortic root and ascending aorta are structurally normal, with  no evidence of dilitation.   Venous: The inferior vena cava is normal in size with greater than 50%  respiratory  variability, suggesting right atrial pressure of 3 mmHg.   IAS/Shunts: The interatrial septum was not well visualized.    03/25/2022 LHC     Dist LAD lesion is 10% stenosed.   1st Mrg lesion is 30% stenosed.   1.  Diffuse mild to moderate  obstructive disease with patent mid LAD stent. 2.  Elevated LVEDP of 25 mmHg.   Diagnostic Dominance: Co-dominant     Patient Profile     Aaron [name] is a 76 y.o. 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 Patient presented to the ED after a ground level fall at home in the setting of worsening dyspnea, dizziness, weakness, and pain. Initial ECG notable for ST depression. Hemoglobin noted low at 4.8. While in the ED, patient had a VF arrest, received CPR and defibrillation x1.  Assessment & Plan    S/P VF Arrest CAD Elevated troponin with demand ischemia     Patient with VF arrest while in [facility] ED. LHC on 9/22 found nonobstructive CAD and no re-stenosis of mid LAD stent. 9/23 TTE with LVEF 55-60%. Troponin 5431, [id number].   VF still suspected secondary to significant anemia, HBG 4.8. D/w EP, suspect reversible etiology in setting of severe anemia, but as he is at risk of rebleeding, Lifevest on discharge. This has been ordered and [employer] rep/case management notified. Discussed with patient as well. Troponin leak very likely 2/2 demand ischemia with anemia. LHC without acute findings. Resume ASA '81mg'$ , cleared with GI per Dr. Cathlean Sauer. Continue atorvastatin '80mg'$  QD   HTN   Patient with persistent hypertension this hospitalization, has been off Telmisartan/HCTZ.   Continue Amlodipine '5mg'$  QD Patient resumed on home ARB yesterday. Remains hypertensive. Will add Spironolactone '25mg'$  QD.   Acute HFpEF   9/23 TTE with LVEF 55-60%, normal RV function, RAP 3 mmHg. LVEDP 25 mmHg on 9/22 LHC. BNP 178.8. On exam today, patient with trace bilateral lower extremity edema. No JVD noted.   Patient received home HCTZ yesterday, has now been switched back to Lasix. Renal function has now  stabilized, Spironolactone '25mg'$  QD added.   Gastric Antral Vascular Ectasia Severe acute on chronic anemia   Patient with a HGB of 4.8 on admission. Chart review shows chronic anemia over the last several year with multiple iron infusions this year. Patient was referred to GI in August 2/2 positive fecal occult blood test. 9/21 EGD with gastric antral vascular ectasia and bleeding which was successfully treated with APC. HGB 8.4 on 9/24.   Management per GI/primary hospitalist team.  Patient very likely to require additional treatments and remains at high risk of bleeding. Okay to continue without [place]. GI recommending outpatient colonoscopy   History of recurrent DVT/PE   Patient has been on Xarelto '20mg'$  for prophylaxis. As patient likely to require additional treatment for his GI bleeding, agree with hospitalist and GI that Xarelto should be held until risk of rebleeding is lower. Patient now s/p IVC filter placement on 9/26     For questions or updates, please contact [place] Please consult [email or web address] for contact info under     Signed, Lily Kocher, PA-C  03/30/2022, 7:50 AM     Patient seen and examined.  Agree with above documentation.  On exam, patient is alert and oriented, regular rate and rhythm, no murmurs,  lungs CTAB, 2+ LE edema.  Hgb stable at 8.5 this morning.  Restarted on IV lasix.  Spironolactone added for BP.  Planning Lifevest on discharge.  Donato Heinz, MD

## 2022-03-30 NOTE — TOC Progression Note (Addendum)
Transition of Care [facility]) - Progression Note    Patient Details  Name: Aaron [name] MRN: [id number] Date of Birth: September 27, 1945  Transition of [facility]) CM/SW Contact  Graves-Bigelow, Ocie Cornfield, RN Phone Number: 03/30/2022, 9:50 AM  Clinical Narrative:   Additional Life Vest information faxed to [employer] this morning. Awaiting insurance authorization. Case Manager also awaiting to hear back from [facility] regarding home health services.   1007 03-30-22 Case Manager spoke with [place] and they can initially start with PT then get orders to add [facility] RN from PCP due to staffing. Patient will need HH PT orders and F2F once stable. [facility] will start care within 24-48 hours post transition home.    1339 03-31-23 Case Manager received confirmation that patient has been approved for Live Vest. Patient may be fit within the next 48 hours.   1515 03-30-22 Case Manager received confirmation that [employer] Rep will fit the patient tomorrow evening 03-31-22.   Expected Discharge Plan: [place] Barriers to Discharge: Continued Medical Work up  Expected Discharge Plan and Services Expected Discharge Plan: [place] In-house Referral: NA Discharge Planning Services: CM Consult Post Acute Care Choice: Home Health, Durable Medical Equipment Living arrangements for the past 2 months: Single Family Home                 DME Arranged: [place] rolling DME Agency: [employer] Date DME Agency Contacted: 03/29/22 Time DME Agency Contacted: [phone] Representative spoke with at DME Agency: [place]: PT [place]: [place] Date [place]: 03/29/22 Time [place]: 1600 Representative spoke with at [place]: Levada Dy  Readmission Risk Interventions     No data to display

## 2022-03-30 NOTE — Progress Notes (Signed)
Mobility Specialist - Progress Note   03/30/22 1039  Mobility  Activity Refused mobility   Pt refused mobility stating he doesn't feel goof right now and wants to take a nap. Will follow if time permits.   Larey Seat

## 2022-03-30 NOTE — Progress Notes (Signed)
PROGRESS NOTE    Aaron [name]  EPP:[id number] DOB: Apr 09, 1946 DOA: 03/24/2022 PCP: [name], MD  76/M with history of hypertension, recurrent DVT/PE on Xarelto, obesity, iron deficiency anemia requiring transfusions, CAD with PCI to LAD in 2005 presented to the ED after a fall, recent history of worsening dyspnea dizziness and weakness.  EKG noted ST depression, hemoglobin was 4.8, in the ED patient had V-fib arrest required CPR and defibrillation X1 -Admitted to the ICU received 3 units of PRBC -EGD 9/21 noted Graves' disease treated with APC 9/22 underwent coronary angiography which noted mild to moderate obstructive CAD 9/22: Extubated Transferred to [place] to be high risk to resume full dose anticoagulation, IR was consulted -9/26 IVC filter placed Cardiology recommends LifeVest prior to discharge  Subjective: -Feels okay overall, still has soreness at the site of chest compressions, significant swelling  Assessment and Plan:  S/p Ventricular fibrillation arrest Cardiac arrest Vfib, due to severe anemia due to acute blood loss anemia, due to upper GI bleed.  Cards following, left heart cath without acute findings, nonobstructive CAD -Echo noted preserved EF without wall motion abnormalities -Transfuse PRBC, hemoglobin stable -Cardiology recommends LifeVest at discharge -Ambulate, PT eval  GAVE (gastric antral vascular ectasia) Acute blood loss anemia Transfused 3 units of PRBC -EGD noted GAVE's disease, treated with APC -Considered high risk of bleeding, continue PPI, follow-up with gastroenterology -Anticoagulation discontinued  Acute on chronic diastolic CHF (congestive heart failure) (HCC) Echocardiogram with preserved LV systolic function with EF 55 to 60%, mild LVH, RV with preserved systolic function.  -Has significant lower extremity edema we will hold HCTZ today -Add IV Lasix, continue Aldactone -Continue empagliflozin  History of pulmonary  embolism CT chest - 2012 with bilateral pulmonary embolism with significant clot burden, right ventricular strain pattern.  -2015 acute occlusive deep vein thrombosis involving peroneal and posterior tibial veins of the left lower extremity. Acute non occlusive deep vein thrombosis noted in the popliteal vein of the left lower extremity.  -On account of severe blood loss anemia, Graves' disease and V-fib arrest, and high risk of recurrent bleeding -Decision was made to discontinue anticoagulation -IVC filter placed in interventional radiology 9/26 -Down the road filter could be removed and restarted on anticoagulation  Acute kidney injury superimposed on chronic kidney disease (HCC) CKD stage 2 , hyponatremia  -Stable, continue current diuretics  Pre-diabetes Continue glucose cover and monitoring with insulin sliding scale.  Fasting glucose this am is 101 mg/dl.   Depression Anxiety. -continue with venlafaxine, clonazepam and trazodone PRN alprazolam during his hospitalization   Class 1 obesity Calculated BMI is 33,1   DVT prophylaxis: SCD Code Status: Full code Family Communication: Discussed with patient detail, no family at bedside Disposition Plan: Home lik[place] 48h  Consultants: Neurology   Procedures: 03/25/2022 LHC     Dist LAD lesion is 10% stenosed.   1st Mrg lesion is 30% stenosed.   1.  Diffuse mild to moderate obstructive disease with patent mid LAD stent. 2.  Elevated LVEDP of 25 mmHg.  Antimicrobials:    Objective: Vitals:   03/29/22 1952 03/29/22 2104 03/30/22 0715 03/30/22 1024  BP:  (!) 157/69 (!) 155/70 (!) 150/111  Pulse:   76   Resp: 18  16   Temp: 98 F (36.7 C)  99.1 F (37.3 C)   TempSrc: Oral  Oral   SpO2:      Weight:   109.3 kg   Height:  Intake/Output Summary (Last 24 hours) at 03/30/2022 1146 Last data filed at 03/30/2022 0854 Gross per 24 hour  Intake 840 ml  Output 700 ml  Net 140 ml   Filed Weights   03/27/22 0636  03/28/22 0533 03/30/22 0715  Weight: 112.5 kg 112.3 kg 109.3 kg    Examination:  General exam: Pleasant elderly male sitting up in bed, AAOx3, no distress HEENT: Positive JVD CVS: S1-S2, regular rhythm Lungs: Decreased breath sounds bases Abdomen: Soft, obese, nontender bowel sounds present Extremities: 2+ edema Skin: No rashes Psychiatry:  Mood & affect appropriate.     Data Reviewed:   CBC: Recent Labs  Lab 03/24/22 0415 03/24/22 1410 03/24/22 2139 03/25/22 0517 03/25/22 0811 03/25/22 1943 03/26/22 0339 03/27/22 0424 03/30/22 0315  WBC 5.9 6.7  --  6.3  --  13.8* 10.9*  --  5.9  NEUTROABS 4.4  --   --   --   --   --   --   --   --   HGB 4.8* 8.1*   < > 7.3* 8.5* 8.7* 8.8* 8.5* 8.5*  HCT 16.8* 26.5*   < > 21.5* 25.0* 27.5* 27.8* 27.6* 27.7*  MCV 91.3 87.5  --  85.0  --  84.1 84.8  --  85.0  PLT 219 226  --  209  --  314 311  --  317   < > = values in this interval not displayed.   Basic Metabolic Panel: Recent Labs  Lab 03/25/22 0517 03/25/22 0811 03/26/22 0339 03/27/22 0424 03/28/22 0243 03/29/22 0222 03/30/22 0315  NA 131*   < > 138 138 139 140 138  K 3.8   < > 3.8 3.9 4.3 4.0 3.8  CL 100  --  104 106 107 107 104  CO2 18*  --  '22 22 23 23 25  '$ GLUCOSE 142*  --  113* 101* 101* 96 102*  BUN 31*  --  35* 29* 27* 25* 17  CREATININE 1.82*  --  1.82* 1.13 1.16 1.08 0.90  CALCIUM 7.7*  --  9.1 8.5* 8.4* 8.2* 8.6*  MG 2.1  --   --   --   --   --   --   PHOS 5.3*  --   --   --   --   --   --    < > = values in this interval not displayed.   GFR: Estimated Creatinine Clearance: 81 mL/min (by C-G formula based on SCr of 0.9 mg/dL). Liver Function Tests: Recent Labs  Lab 03/24/22 0415  AST 31  ALT 22  ALKPHOS 71  BILITOT 0.2*  PROT 5.5*  ALBUMIN 3.1*   No results for input(s): "LIPASE", "AMYLASE" in the last 168 hours. No results for input(s): "AMMONIA" in the last 168 hours. Coagulation Profile: No results for input(s): "INR", "PROTIME" in the last  168 hours. Cardiac Enzymes: No results for input(s): "CKTOTAL", "CKMB", "CKMBINDEX", "TROPONINI" in the last 168 hours. BNP (last 3 results) No results for input(s): "PROBNP" in the last 8760 hours. HbA1C: No results for input(s): "HGBA1C" in the last 72 hours. CBG: Recent Labs  Lab 03/29/22 0742 03/29/22 1232 03/29/22 1716 03/29/22 2130 03/30/22 0842  GLUCAP 101* 110* 108* 110* 108*   Lipid Profile: No results for input(s): "CHOL", "HDL", "LDLCALC", "TRIG", "CHOLHDL", "LDLDIRECT" in the last 72 hours. Thyroid Function Tests: No results for input(s): "TSH", "T4TOTAL", "FREET4", "T3FREE", "THYROIDAB" in the last 72 hours. Anemia Panel: No results for input(s): "VITAMINB12", "FOLATE", "  FERRITIN", "TIBC", "IRON", "RETICCTPCT" in the last 72 hours. Urine analysis:    Component Value Date/Time   COLORURINE YELLOW 03/25/2016 1510   APPEARANCEUR CLEAR 03/25/2016 1510   LABSPEC 1.013 03/25/2016 1510   PHURINE 7.0 03/25/2016 1510   GLUCOSEU NEGATIVE 03/25/2016 1510   HGBUR NEGATIVE 03/25/2016 1510   BILIRUBINUR NEGATIVE 03/25/2016 1510   KETONESUR NEGATIVE 03/25/2016 1510   PROTEINUR 30 (A) 03/25/2016 1510   NITRITE NEGATIVE 03/25/2016 1510   LEUKOCYTESUR NEGATIVE 03/25/2016 1510   Sepsis Labs: '@LABRCNTIP'$ (procalcitonin:4,lacticidven:4)  ) Recent Results (from the past 240 hour(s))  MRSA Next Gen by PCR, Nasal     Status: None   Collection Time: 03/24/22  5:31 PM   Specimen: Nasal Mucosa; Nasal Swab  Result Value Ref Range Status   MRSA by PCR Next Gen NOT DETECTED NOT DETECTED Final    Comment: (NOTE) The GeneXpert MRSA Assay (FDA approved for NASAL specimens only), is one component of a comprehensive MRSA colonization surveillance program. It is not intended to diagnose MRSA infection nor to guide or monitor treatment for MRSA infections. Test performance is not FDA approved in patients less than 47 years old. Performed at [facility], [employer] [address],  [place], [place] [address]      Radiology Studies: IR IVC FILTER PLMT / S&I Burke Keels GUID/MOD SED  Result Date: 03/29/2022 INDICATION: 76 year old gentleman with history of prior DVT and pulmonary embolism recently admitted for upper GI bleed. He presents to IR for IVC filter placement. EXAM: Ultrasound-guided access of right internal jugular vein Infrarenal retrievable IVC filter placement MEDICATIONS: None. ANESTHESIA/SEDATION: Moderate (conscious) sedation was employed during this procedure. A total of Versed 1.5 mg and Fentanyl 25 mcg was administered intravenously by the radiology nurse. Total intra-service moderate Sedation Time: 12 minutes. The patient's level of consciousness and vital signs were monitored continuously by radiology nursing throughout the procedure under my direct supervision. FLUOROSCOPY: Radiation Exposure Index (as provided by the fluoroscopic device): 659 mGy Kerma COMPLICATIONS: None immediate. PROCEDURE: Informed written consent was obtained from the patient after a thorough discussion of the procedural risks, benefits and alternatives. All questions were addressed. Maximal Sterile Barrier Technique was utilized including caps, mask, sterile gowns, sterile gloves, sterile drape, hand hygiene and skin antiseptic. A timeout was performed prior to the initiation of the procedure. Patient positioned supine on the angiography table. Right neck prepped and draped in usual sterile fashion. All elements of maximal sterile barrier were utilized including, cap, mask, sterile gown, sterile gloves, large sterile drape, hand scrubbing and 2% Chlorhexidine for skin cleaning. The right internal jugular vein was evaluated with ultrasound and shown to be patent. A permanent ultrasound image was obtained and placed in the patient's medical record. Using sterile gel and a sterile probe cover, the right internal jugular vein was entered with a 21 ga needle during real time ultrasound guidance. 21 gauge  needle exchanged for a transitional dilator set over 0.018 inch guidewire. Transitional dilator set exchanged for 10 fr sheath over 0.035 inch guidewire. Sheath placed to the infrarenal IVC and venogram performed to delineate the position of the renal veins. Retrievable Denali IVC filter deployed in the infrarenal location. Sheath removed and hemostasis achieved with manual compression. IMPRESSION: Retrievable IVC filter placement. PLAN: This IVC filter is potentially retrievable. The patient will be assessed for filter retrieval by Interventional Radiology in approximately 8-12 weeks. Further recommendations regarding filter retrieval, continued surveillance or declaration of device permanence, will be made at that time. Electronically Signed  By: Sharen Heck  Mir M.D.   On: 03/29/2022 14:50     Scheduled Meds:  amLODipine  5 mg Oral QHS   aspirin EC  81 mg Oral Daily   atorvastatin  80 mg Oral Daily   clonazePAM  0.5 mg Oral QHS   empagliflozin  10 mg Oral Daily   ezetimibe  10 mg Oral Daily   ferrous sulfate  325 mg Oral Q breakfast   furosemide  40 mg Intravenous BID   insulin aspart  0-15 Units Subcutaneous TID WC   irbesartan  300 mg Oral Daily   melatonin  3 mg Oral Once   pantoprazole  40 mg Oral Daily   polyethylene glycol  17 g Oral BID   sodium chloride flush  3 mL Intravenous Q12H   spironolactone  25 mg Oral Daily   traZODone  50 mg Oral QHS   venlafaxine XR  150 mg Oral Q breakfast   Continuous Infusions:  sodium chloride 10 mL/hr at 03/25/22 1200   sodium chloride       LOS: 6 days    Time spent: 27mn  PDomenic Polite MD [facility]   03/30/2022, 11:46 AM

## 2022-03-31 ENCOUNTER — Telehealth: Payer: Self-pay | Admitting: Internal Medicine

## 2022-03-31 DIAGNOSIS — I5033 Acute on chronic diastolic (congestive) heart failure: Secondary | ICD-10-CM | POA: Diagnosis not present

## 2022-03-31 DIAGNOSIS — I10 Essential (primary) hypertension: Secondary | ICD-10-CM | POA: Diagnosis not present

## 2022-03-31 DIAGNOSIS — D649 Anemia, unspecified: Secondary | ICD-10-CM | POA: Diagnosis not present

## 2022-03-31 DIAGNOSIS — I4901 Ventricular fibrillation: Secondary | ICD-10-CM | POA: Diagnosis not present

## 2022-03-31 LAB — CBC
HCT: 28.3 % — ABNORMAL LOW (ref 39.0–52.0)
Hemoglobin: 8.7 g/dL — ABNORMAL LOW (ref 13.0–17.0)
MCH: 26.3 pg (ref 26.0–34.0)
MCHC: 30.7 g/dL (ref 30.0–36.0)
MCV: 85.5 fL (ref 80.0–100.0)
Platelets: 309 10*3/uL (ref 150–400)
RBC: 3.31 MIL/uL — ABNORMAL LOW (ref 4.22–5.81)
RDW: 15.9 % — ABNORMAL HIGH (ref 11.5–15.5)
WBC: 7 10*3/uL (ref 4.0–10.5)
nRBC: 0 % (ref 0.0–0.2)

## 2022-03-31 LAB — GLUCOSE, CAPILLARY
Glucose-Capillary: 101 mg/dL — ABNORMAL HIGH (ref 70–99)
Glucose-Capillary: 99 mg/dL (ref 70–99)
Glucose-Capillary: 106 mg/dL — ABNORMAL HIGH (ref 70–99)
Glucose-Capillary: 115 mg/dL — ABNORMAL HIGH (ref 70–99)

## 2022-03-31 LAB — BASIC METABOLIC PANEL
Anion gap: 11 (ref 5–15)
BUN: 21 mg/dL (ref 8–23)
CO2: 26 mmol/L (ref 22–32)
Calcium: 8.9 mg/dL (ref 8.9–10.3)
Chloride: 101 mmol/L (ref 98–111)
Creatinine, Ser: 1.01 mg/dL (ref 0.61–1.24)
GFR, Estimated: >60 mL/min (ref >60)
Glucose, Bld: 102 mg/dL — ABNORMAL HIGH (ref 70–99)
Potassium: 4 mmol/L (ref 3.5–5.1)
Sodium: 138 mmol/L (ref 135–145)

## 2022-03-31 LAB — MAGNESIUM: Magnesium: 2.1 mg/dL (ref 1.7–2.4)

## 2022-03-31 MED ORDER — POTASSIUM CHLORIDE CRYS ER 20 MEQ PO TBCR
40.0000 meq | EXTENDED_RELEASE_TABLET | Freq: Once | ORAL | Status: AC
  Administered 2022-03-31: 40 meq via ORAL
  Filled 2022-03-31: qty 2

## 2022-03-31 MED ORDER — FUROSEMIDE 10 MG/ML IJ SOLN
40.0000 mg | Freq: Two times a day (BID) | INTRAMUSCULAR | Status: AC
  Administered 2022-03-31 (×2): 40 mg via INTRAVENOUS
  Filled 2022-03-31 (×2): qty 4

## 2022-03-31 NOTE — Progress Notes (Signed)
Physical Therapy Treatment Patient Details Name: Aaron [name] MRN: [id number] DOB: 1946/02/11 Today's Date: 03/31/2022   History of Present Illness Pt is a 76 y.o. male admitted 03/24/22 with dizziness and fall. Workup for significant anemia, concern for GIB. Brief vfib arrest in ED. S/p upper endoscopy 9/21. S/p LHC 9/22. ETT 9/21-9/22. 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    PT Comments    Pt tolerates treatment well with increased tolerance for ambulation. Pt is encouraged to ambulate multiple times daily, appears in agreement with this. PT anticipates the pt will progress well if he continues to mobilize, however he has been reluctant to do so at times this admission. PT continues to recommend HHPT.  Recommendations for follow up therapy are one component of a multi-disciplinary discharge planning process, led by the attending physician.  Recommendations may be updated based on patient status, additional functional criteria and insurance authorization.  Follow Up Recommendations  Home health PT     Assistance Recommended at Discharge Intermittent Supervision/Assistance  Patient can return home with the following A little help with bathing/dressing/bathroom;Assistance with cooking/housework;Assist for transportation   Equipment Recommendations  None recommended by PT    Recommendations for Other Services       Precautions / Restrictions Precautions Precautions: Fall;Other (comment) Precaution Comments: R-side rib pain from CPR Restrictions Weight Bearing Restrictions: No     Mobility  Bed Mobility Overal bed mobility: Needs Assistance Bed Mobility: Supine to Sit     Supine to sit: Supervision, HOB elevated          Transfers Overall transfer level: Needs assistance Equipment used: Rolling walker (2 wheels) Transfers: Sit to/from Stand Sit to Stand: Supervision                 Ambulation/Gait Ambulation/Gait assistance: Supervision Gait Distance (Feet): 120 Feet Assistive device: Rolling walker (2 wheels) Gait Pattern/deviations: Step-through pattern Gait velocity: reduced Gait velocity interpretation: <1.8 ft/sec, indicate of risk for recurrent falls   General Gait Details: slowed step-through gait   Stairs             Wheelchair Mobility    Modified Rankin (Stroke Patients Only)       Balance Overall balance assessment: Needs assistance Sitting-balance support: No upper extremity supported, Feet supported Sitting balance-Leahy Scale: Good     Standing balance support: Single extremity supported, Reliant on assistive device for balance Standing balance-Leahy Scale: Poor                              Cognition Arousal/Alertness: Awake/alert Behavior During Therapy: WFL for tasks assessed/performed Overall Cognitive Status: Within Functional Limits for tasks assessed                                          Exercises      General Comments General comments (skin integrity, edema, etc.): VSS on RA, HR in 90s with activity      Pertinent Vitals/Pain Pain Assessment Pain Assessment: Faces Faces Pain Scale: Hurts little more Pain Location: ribs Pain Descriptors / Indicators: Sore Pain Intervention(s): Monitored during session    Home Living                          Prior  Function            PT Goals (current goals can now be found in the care plan section) Acute Rehab PT Goals Patient Stated Goal: return home, willing to consider HHPT Progress towards PT goals: Progressing toward goals    Frequency    Min 3X/week      PT Plan Current plan remains appropriate    Co-evaluation              AM-PAC PT "6 Clicks" Mobility   Outcome Measure  Help needed turning from your back to your side while in a flat bed without using bedrails?: None Help needed moving from  lying on your back to sitting on the side of a flat bed without using bedrails?: A Little Help needed moving to and from a bed to a chair (including a wheelchair)?: A Little Help needed standing up from a chair using your arms (e.g., wheelchair or bedside chair)?: A Little Help needed to walk in hospital room?: A Little Help needed climbing 3-5 steps with a railing? : A Little 6 Click Score: 19    End of Session   Activity Tolerance: Patient tolerated treatment well Patient left: in bed;with call bell/phone within reach Nurse Communication: Mobility status PT Visit Diagnosis: Other abnormalities of gait and mobility (R26.89);Muscle weakness (generalized) (M62.81)     Time: 4599-7741 PT Time Calculation (min) (ACUTE ONLY): 13 min  Charges:  $Gait Training: 8-22 mins                     Zenaida Niece, PT, DPT Acute Rehabilitation Office [place] 03/31/2022, 1:01 PM

## 2022-03-31 NOTE — Progress Notes (Signed)
Mobility Specialist - Progress Note   03/31/22 1424  Mobility  Activity Ambulated with assistance in hallway  Level of Assistance Standby assist, set-up cues, supervision of patient - no hands on  Assistive Device Front wheel walker  Distance Ambulated (ft) 240 ft  Activity Response Tolerated well  $Mobility charge 1 Mobility   Pt received in hallway and agreeable. No complaints throughout. Pt returned to EOB with all needs met.   Larey Seat

## 2022-03-31 NOTE — Progress Notes (Addendum)
Rounding Note    Patient Name: Aaron [name] Date of Encounter: 03/31/2022  [place] Cardiologist: Peter Martinique, MD   Subjective   Pt seen after BM.   Inpatient Medications    Scheduled Meds:  amLODipine  5 mg Oral QHS   aspirin EC  81 mg Oral Daily   atorvastatin  80 mg Oral Daily   clonazePAM  0.5 mg Oral QHS   empagliflozin  10 mg Oral Daily   ezetimibe  10 mg Oral Daily   ferrous sulfate  325 mg Oral Q breakfast   furosemide  40 mg Intravenous BID   insulin aspart  0-15 Units Subcutaneous TID WC   irbesartan  300 mg Oral Daily   melatonin  3 mg Oral Once   pantoprazole  40 mg Oral Daily   polyethylene glycol  17 g Oral BID   potassium chloride  40 mEq Oral Once   sodium chloride flush  3 mL Intravenous Q12H   spironolactone  25 mg Oral Daily   traZODone  50 mg Oral QHS   venlafaxine XR  150 mg Oral Q breakfast   Continuous Infusions:  sodium chloride 10 mL/hr at 03/25/22 1200   sodium chloride     PRN Meds: sodium chloride, sodium chloride, acetaminophen, ALPRAZolam, alum & mag hydroxide-simeth, docusate sodium, HYDROcodone-acetaminophen, HYDROmorphone (DILAUDID) injection, ondansetron (ZOFRAN) IV, sodium chloride flush   Vital Signs    Vitals:   03/30/22 1232 03/30/22 2100 03/31/22 0700 03/31/22 0823  BP: (!) 154/67 (!) 145/91 (!) 155/72   Pulse: 76  88 85  Resp: '18  16 16  '$ Temp: 98.6 F (37 C)  98.6 F (37 C) 98.1 F (36.7 C)  TempSrc: Oral  Oral Oral  SpO2: 100%   98%  Weight:   107.7 kg   Height:        Intake/Output Summary (Last 24 hours) at 03/31/2022 0919 Last data filed at 03/31/2022 0700 Gross per 24 hour  Intake 720 ml  Output 3900 ml  Net -3180 ml      03/31/2022    7:00 AM 03/30/2022    7:15 AM 03/28/2022    5:33 AM  Last 3 Weights  Weight (lbs) 237 lb 6.4 oz 241 lb 247 lb 9.6 oz  Weight (kg) 107.684 kg 109.317 kg 112.311 kg      Telemetry    Sinus rhythm in the 80s with 12-beat run of VT yesterday afternoon -  Personally Reviewed  ECG    No new tracings - Personally Reviewed  Physical Exam   GEN: No acute distress.   Neck: No JVD Cardiac: RRR, no murmurs, rubs, or gallops.  Respiratory: Clear to auscultation bilaterally. GI: Soft, nontender, non-distended  MS: at least 2-3+ LE edema Neuro:  Nonfocal  Psych: Normal affect   Labs    High Sensitivity Troponin:   Recent Labs  Lab 03/24/22 0415 03/24/22 0631  TROPONINIHS 5,431* 3,894*     Chemistry Recent Labs  Lab 03/25/22 0517 03/25/22 6433 03/29/22 0222 03/30/22 0315 03/30/22 1832 03/31/22 0223  NA 131*   < > 140 138  --  138  K 3.8   < > 4.0 3.8 4.7 4.0  CL 100   < > 107 104  --  101  CO2 18*   < > 23 25  --  26  GLUCOSE 142*   < > 96 102*  --  102*  BUN 31*   < > 25* 17  --  21  CREATININE 1.82*   < > 1.08 0.90  --  1.01  CALCIUM 7.7*   < > 8.2* 8.6*  --  8.9  MG 2.1  --   --   --   --   --   GFRNONAA 38*   < > >60 >60  --  >60  ANIONGAP 13   < > 10 9  --  11   < > = values in this interval not displayed.    Lipids  Recent Labs  Lab 03/25/22 0517  TRIG 823*    Hematology Recent Labs  Lab 03/26/22 0339 03/27/22 0424 03/30/22 0315 03/31/22 0223  WBC 10.9*  --  5.9 7.0  RBC 3.28*  --  3.26* 3.31*  HGB 8.8* 8.5* 8.5* 8.7*  HCT 27.8* 27.6* 27.7* 28.3*  MCV 84.8  --  85.0 85.5  MCH 26.8  --  26.1 26.3  MCHC 31.7  --  30.7 30.7  RDW 16.2*  --  15.8* 15.9*  PLT 311  --  317 309   Thyroid No results for input(s): "TSH", "FREET4" in the last 168 hours.  BNP Recent Labs  Lab 03/28/22 0243  BNP 167.3*    DDimer No results for input(s): "DDIMER" in the last 168 hours.   Radiology    IR IVC FILTER PLMT / S&I Burke Keels GUID/MOD SED  Result Date: 03/29/2022 INDICATION: 76 year old gentleman with history of prior DVT and pulmonary embolism recently admitted for upper GI bleed. He presents to IR for IVC filter placement. EXAM: Ultrasound-guided access of right internal jugular vein Infrarenal retrievable IVC  filter placement MEDICATIONS: None. ANESTHESIA/SEDATION: Moderate (conscious) sedation was employed during this procedure. A total of Versed 1.5 mg and Fentanyl 25 mcg was administered intravenously by the radiology nurse. Total intra-service moderate Sedation Time: 12 minutes. The patient's level of consciousness and vital signs were monitored continuously by radiology nursing throughout the procedure under my direct supervision. FLUOROSCOPY: Radiation Exposure Index (as provided by the fluoroscopic device): 532 mGy Kerma COMPLICATIONS: None immediate. PROCEDURE: Informed written consent was obtained from the patient after a thorough discussion of the procedural risks, benefits and alternatives. All questions were addressed. Maximal Sterile Barrier Technique was utilized including caps, mask, sterile gowns, sterile gloves, sterile drape, hand hygiene and skin antiseptic. A timeout was performed prior to the initiation of the procedure. Patient positioned supine on the angiography table. Right neck prepped and draped in usual sterile fashion. All elements of maximal sterile barrier were utilized including, cap, mask, sterile gown, sterile gloves, large sterile drape, hand scrubbing and 2% Chlorhexidine for skin cleaning. The right internal jugular vein was evaluated with ultrasound and shown to be patent. A permanent ultrasound image was obtained and placed in the patient's medical record. Using sterile gel and a sterile probe cover, the right internal jugular vein was entered with a 21 ga needle during real time ultrasound guidance. 21 gauge needle exchanged for a transitional dilator set over 0.018 inch guidewire. Transitional dilator set exchanged for 10 fr sheath over 0.035 inch guidewire. Sheath placed to the infrarenal IVC and venogram performed to delineate the position of the renal veins. Retrievable Denali IVC filter deployed in the infrarenal location. Sheath removed and hemostasis achieved with manual  compression. IMPRESSION: Retrievable IVC filter placement. PLAN: This IVC filter is potentially retrievable. The patient will be assessed for filter retrieval by Interventional Radiology in approximately 8-12 weeks. Further recommendations regarding filter retrieval, continued surveillance or declaration of device permanence, will be made  at that time. Electronically Signed   By: Miachel Roux M.D.   On: 03/29/2022 14:50    Cardiac Studies   Left heart cath 03/25/22:   Dist LAD lesion is 10% stenosed.   1st Mrg lesion is 30% stenosed.   1.  Diffuse mild to moderate obstructive disease with patent mid LAD stent. 2.  Elevated LVEDP of 25 mmHg.   Commendation: Continued medical therapy.   Echo 03/26/22:  1. Study not well visualized to full assess endocardial borders. Left  ventricular ejection fraction, by estimation, is 55 to 60%. The left  ventricle has normal function. The left ventricle has no regional wall  motion abnormalities. There is mild left  ventricular hypertrophy. Left ventricular diastolic parameters were  normal.   2. Right ventricular systolic function is normal. The right ventricular  size is normal. There is normal pulmonary artery systolic pressure.   3. The mitral valve is normal in structure. No evidence of mitral valve  regurgitation.   4. The aortic valve was not well visualized. Aortic valve regurgitation  is not visualized.   5. The inferior vena cava is normal in size with greater than 50%  respiratory variability, suggesting right atrial pressure of 3 mmHg.    TEE 03/24/22: See external report  Patient Profile     76 y.o. 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 Patient presented to the ED after a ground level fall at home in the setting of  worsening dyspnea, dizziness, weakness, and pain. Initial ECG notable for ST depression. Hemoglobin noted low at 4.8. While in the ED, patient had a VF arrest, received CPR and defibrillation x1.  Assessment & Plan    VF arrest Nonobstructive CAD NSTEMI - demand ischemia Acute blood loss anemia in the setting of GI bleed  Pt suffered an in-hospital VF arrest in the [facility] in the setting of GI bleed with Hb 4.8. LHC revealed nonobstructive CAD, no intervention. Telemetry with sinus rhythm.  Suspect a reversible etiology in the setting of severe anemia. However, felt at risk for re-bleeding and lifevest was recommended at discharge. Xarelto held, IVC filter in place.  Hypertension Home meds initially held. Now on 300 mg irbesartan (was on telmisartan-HCTZ PTA), and amlodipine.  - BP not controlled, spironolactone was added yesterday - BP now in the 150s, has not received AM meds yet  Acute on chronic diastolic heart failure Echo with preserved EF, diastolic parameters read as normal Mildly elevated BNP on admission Lower extremity edema on exam yesterday, restarted lasix, HCTZ on hold - significant LE edema today - continue 40 mg IV lasix BID  - weight down to 237 lbs from peak of 252 lbs   Recurrent DVT/PE Has been treated with xarelto Given high bleeding risk, IVC filter placed yesterday Continue holding xarelto for risk of re-bleeding, per GI and primary      For questions or updates, please contact [place] Please consult [email or web address] for contact info under        Signed, Ledora Bottcher, PA  03/31/2022, 9:19 AM    Patient seen and examined.  Agree with above documentation.  On exam, patient is alert and oriented, regular rate and rhythm, no murmurs, lungs CTAB, 2+ LE edema.  Net negative 2.7L yesterday on IV lasix 40 mg BID.  Cr stable at 1.01.  Hgb stable at 8.7.  IVC filter placed.  Planning lifevest on discahrge.  Continue IV lasix today, remains volume  overloaded.  Donato Heinz, MD

## 2022-03-31 NOTE — TOC Progression Note (Signed)
Transition of Care [facility]) - Progression Note    Patient Details  Name: Aaron [name] MRN: [id number] Date of Birth: 1945-08-24  Transition of Care [facility]) CM/SW Contact  Graves-Bigelow, Ocie Cornfield, RN Phone Number: 03/31/2022, 3:37 PM  Clinical Narrative:   Case Manager spoke with the provider regarding [place] fitting. Patient continues with IV diuresis and provider feels that patient should be fit on Friday. Case Manager did relay the information to RN that will fit the patient. No further needs identified at this time.   Expected Discharge Plan: [place] Barriers to Discharge: Continued Medical Work up  Expected Discharge Plan and Services Expected Discharge Plan: [place] In-house Referral: NA Discharge Planning Services: CM Consult Post Acute Care Choice: Home Health, Durable Medical Equipment Living arrangements for the past 2 months: Single Family Home                 DME Arranged: [place] rolling DME Agency: [employer] Date DME Agency Contacted: 03/29/22 Time DME Agency Contacted: [phone] Representative spoke with at DME Agency: [place]: PT [place]: [place] Date [place]: 03/29/22 Time [place]: 1600 Representative spoke with at [place]: Levada Dy   Readmission Risk Interventions     No data to display

## 2022-03-31 NOTE — Telephone Encounter (Signed)
Called patient regarding upcoming October appointments, left a voicemail.  

## 2022-03-31 NOTE — Progress Notes (Signed)
PROGRESS NOTE    Aaron [name]  WCB:[id number] DOB: 22-Feb-1946 DOA: 03/24/2022 PCP: [name], MD  76/M with history of hypertension, recurrent DVT/PE on Xarelto, obesity, iron deficiency anemia requiring transfusions, CAD with PCI to LAD in 2005 presented to the ED after a fall, recent history of worsening dyspnea dizziness and weakness.  EKG noted ST depression, hemoglobin was 4.8, in the ED patient had V-fib arrest required CPR and defibrillation X1 -Admitted to the ICU received 3 units of PRBC -EGD 9/21 noted Graves' disease treated with APC 9/22 underwent coronary angiography which noted mild to moderate obstructive CAD 9/22: Extubated Transferred to [place] to be high risk to resume full dose anticoagulation, IR was consulted -9/26 IVC filter placed Cardiology recommends LifeVest prior to discharge  Subjective: Feels better, breathing improving, urinated a lot yesterday  Assessment and Plan:  S/p Ventricular fibrillation arrest Cardiac arrest Vfib, due to severe anemia due to acute blood loss anemia, due to upper GI bleed.  Cards following, left heart cath without acute findings, nonobstructive CAD -Echo noted preserved EF without wall motion abnormalities -Transfused PRBC, hemoglobin stable -Cardiology recommends LifeVest at discharge -Ambulate, PT eval completed  GAVE (gastric antral vascular ectasia) Acute blood loss anemia Transfused 3 units of PRBC -EGD noted GAVE's disease, treated with APC -Considered high risk of bleeding, continue PPI, follow-up with gastroenterology -Anticoagulation discontinued  Acute on chronic diastolic CHF (congestive heart failure) (HCC) Echocardiogram with preserved LV systolic function with EF 55 to 60%, mild LVH, RV with preserved systolic function.  -Improving with diuresis, continue IV Lasix, he is 5.9 L negative brisk diuresis yesterday, continue Lasix for 1-2 more days, continue Aldactone and empagliflozin  -BMP in  a.m.  History of pulmonary embolism CT chest - 2012 with bilateral pulmonary embolism with significant clot burden, right ventricular strain pattern.  -2015 acute occlusive deep vein thrombosis involving peroneal and posterior tibial veins of the left lower extremity. Acute non occlusive deep vein thrombosis noted in the popliteal vein of the left lower extremity.  -On account of severe blood loss anemia, Graves' disease and V-fib arrest, and high risk of recurrent bleeding -Decision was made to discontinue anticoagulation -IVC filter placed in interventional radiology 9/26 -Down the road filter could be removed and restarted on anticoagulation  Acute kidney injury superimposed on chronic kidney disease (HCC) CKD stage 2 , hyponatremia  -Stable, continue current diuretics  Pre-diabetes Continue glucose cover and monitoring with insulin sliding scale.  Fasting glucose this am is 101 mg/dl.   Depression Anxiety. -continue with venlafaxine, clonazepam and trazodone PRN alprazolam during his hospitalization   Class 1 obesity Calculated BMI is 33,1   DVT prophylaxis: SCD Code Status: Full code Family Communication: Patient and wife at bedside Disposition Plan: Home [place] 48h  Consultants: Neurology   Procedures: 03/25/2022 LHC     Dist LAD lesion is 10% stenosed.   1st Mrg lesion is 30% stenosed.   1.  Diffuse mild to moderate obstructive disease with patent mid LAD stent. 2.  Elevated LVEDP of 25 mmHg.  Antimicrobials:    Objective: Vitals:   03/30/22 1232 03/30/22 2100 03/31/22 0700 03/31/22 0823  BP: (!) 154/67 (!) 145/91 (!) 155/72   Pulse: 76  88 85  Resp: '18  16 16  '$ Temp: 98.6 F (37 C)  98.6 F (37 C) 98.1 F (36.7 C)  TempSrc: Oral  Oral Oral  SpO2: 100%   98%  Weight:   107.7 kg   Height:  Intake/Output Summary (Last 24 hours) at 03/31/2022 1140 Last data filed at 03/31/2022 0700 Gross per 24 hour  Intake 720 ml  Output 3900 ml  Net -3180 ml    Filed Weights   03/28/22 0533 03/30/22 0715 03/31/22 0700  Weight: 112.3 kg 109.3 kg 107.7 kg    Examination:  General exam: Pleasant elderly male sitting up in bed, AAOx3, no distress HEENT: Positive JVD CVS: S1-S2, regular rhythm Lungs: Decreased breath sounds the bases Abdomen: Soft, obese, nontender, bowel sounds present Extremities: 2+ edema  Skin: No rashes Psychiatry:  Mood & affect appropriate.     Data Reviewed:   CBC: Recent Labs  Lab 03/25/22 0517 03/25/22 0811 03/25/22 1943 03/26/22 0339 03/27/22 0424 03/30/22 0315 03/31/22 0223  WBC 6.3  --  13.8* 10.9*  --  5.9 7.0  HGB 7.3*   < > 8.7* 8.8* 8.5* 8.5* 8.7*  HCT 21.5*   < > 27.5* 27.8* 27.6* 27.7* 28.3*  MCV 85.0  --  84.1 84.8  --  85.0 85.5  PLT 209  --  314 311  --  317 309   < > = values in this interval not displayed.   Basic Metabolic Panel: Recent Labs  Lab 03/25/22 0517 03/25/22 0811 03/27/22 0424 03/28/22 0243 03/29/22 0222 03/30/22 0315 03/30/22 1832 03/31/22 0223  NA 131*   < > 138 139 140 138  --  138  K 3.8   < > 3.9 4.3 4.0 3.8 4.7 4.0  CL 100   < > 106 107 107 104  --  101  CO2 18*   < > '22 23 23 25  '$ --  26  GLUCOSE 142*   < > 101* 101* 96 102*  --  102*  BUN 31*   < > 29* 27* 25* 17  --  21  CREATININE 1.82*   < > 1.13 1.16 1.08 0.90  --  1.01  CALCIUM 7.7*   < > 8.5* 8.4* 8.2* 8.6*  --  8.9  MG 2.1  --   --   --   --   --   --  2.1  PHOS 5.3*  --   --   --   --   --   --   --    < > = values in this interval not displayed.   GFR: Estimated Creatinine Clearance: 71.6 mL/min (by C-G formula based on SCr of 1.01 mg/dL). Liver Function Tests: No results for input(s): "AST", "ALT", "ALKPHOS", "BILITOT", "PROT", "ALBUMIN" in the last 168 hours.  No results for input(s): "LIPASE", "AMYLASE" in the last 168 hours. No results for input(s): "AMMONIA" in the last 168 hours. Coagulation Profile: No results for input(s): "INR", "PROTIME" in the last 168 hours. Cardiac  Enzymes: No results for input(s): "CKTOTAL", "CKMB", "CKMBINDEX", "TROPONINI" in the last 168 hours. BNP (last 3 results) No results for input(s): "PROBNP" in the last 8760 hours. HbA1C: No results for input(s): "HGBA1C" in the last 72 hours. CBG: Recent Labs  Lab 03/30/22 0842 03/30/22 1230 03/30/22 1616 03/30/22 2108 03/31/22 0728  GLUCAP 108* 123* 137* 106* 101*   Lipid Profile: No results for input(s): "CHOL", "HDL", "LDLCALC", "TRIG", "CHOLHDL", "LDLDIRECT" in the last 72 hours. Thyroid Function Tests: No results for input(s): "TSH", "T4TOTAL", "FREET4", "T3FREE", "THYROIDAB" in the last 72 hours. Anemia Panel: No results for input(s): "VITAMINB12", "FOLATE", "FERRITIN", "TIBC", "IRON", "RETICCTPCT" in the last 72 hours. Urine analysis:    Component Value Date/Time   COLORURINE YELLOW  03/25/2016 [place] 03/25/2016 1510   LABSPEC 1.013 03/25/2016 1510   PHURINE 7.0 03/25/2016 1510   GLUCOSEU NEGATIVE 03/25/2016 1510   HGBUR NEGATIVE 03/25/2016 [place] 03/25/2016 [place] 03/25/2016 1510   PROTEINUR 30 (A) 03/25/2016 1510   NITRITE NEGATIVE 03/25/2016 1510   LEUKOCYTESUR NEGATIVE 03/25/2016 1510   Sepsis Labs: '@LABRCNTIP'$ (procalcitonin:4,lacticidven:4)  ) Recent Results (from the past 240 hour(s))  MRSA Next Gen by PCR, Nasal     Status: None   Collection Time: 03/24/22  5:31 PM   Specimen: Nasal Mucosa; Nasal Swab  Result Value Ref Range Status   MRSA by PCR Next Gen NOT DETECTED NOT DETECTED Final    Comment: (NOTE) The GeneXpert MRSA Assay (FDA approved for NASAL specimens only), is one component of a comprehensive MRSA colonization surveillance program. It is not intended to diagnose MRSA infection nor to guide or monitor treatment for MRSA infections. Test performance is not FDA approved in patients less than 56 years old. Performed at [facility], [place] [address] [place], [place] [address]       Radiology Studies: IR IVC FILTER PLMT / S&I Burke Keels GUID/MOD SED  Result Date: 03/29/2022 INDICATION: 76 year old gentleman with history of prior DVT and pulmonary embolism recently admitted for upper GI bleed. He presents to IR for IVC filter placement. EXAM: Ultrasound-guided access of right internal jugular vein Infrarenal retrievable IVC filter placement MEDICATIONS: None. ANESTHESIA/SEDATION: Moderate (conscious) sedation was employed during this procedure. A total of Versed 1.5 mg and Fentanyl 25 mcg was administered intravenously by the radiology nurse. Total intra-service moderate Sedation Time: 12 minutes. The patient's level of consciousness and vital signs were monitored continuously by radiology nursing throughout the procedure under my direct supervision. FLUOROSCOPY: Radiation Exposure Index (as provided by the fluoroscopic device): 671 mGy Kerma COMPLICATIONS: None immediate. PROCEDURE: Informed written consent was obtained from the patient after a thorough discussion of the procedural risks, benefits and alternatives. All questions were addressed. Maximal Sterile Barrier Technique was utilized including caps, mask, sterile gowns, sterile gloves, sterile drape, hand hygiene and skin antiseptic. A timeout was performed prior to the initiation of the procedure. Patient positioned supine on the angiography table. Right neck prepped and draped in usual sterile fashion. All elements of maximal sterile barrier were utilized including, cap, mask, sterile gown, sterile gloves, large sterile drape, hand scrubbing and 2% Chlorhexidine for skin cleaning. The right internal jugular vein was evaluated with ultrasound and shown to be patent. A permanent ultrasound image was obtained and placed in the patient's medical record. Using sterile gel and a sterile probe cover, the right internal jugular vein was entered with a 21 ga needle during real time ultrasound guidance. 21 gauge needle exchanged for a  transitional dilator set over 0.018 inch guidewire. Transitional dilator set exchanged for 10 fr sheath over 0.035 inch guidewire. Sheath placed to the infrarenal IVC and venogram performed to delineate the position of the renal veins. Retrievable Denali IVC filter deployed in the infrarenal location. Sheath removed and hemostasis achieved with manual compression. IMPRESSION: Retrievable IVC filter placement. PLAN: This IVC filter is potentially retrievable. The patient will be assessed for filter retrieval by Interventional Radiology in approximately 8-12 weeks. Further recommendations regarding filter retrieval, continued surveillance or declaration of device permanence, will be made at that time. Electronically Signed   By: Miachel Roux M.D.   On: 03/29/2022 14:50     Scheduled Meds:  amLODipine  5  mg Oral QHS   aspirin EC  81 mg Oral Daily   atorvastatin  80 mg Oral Daily   clonazePAM  0.5 mg Oral QHS   empagliflozin  10 mg Oral Daily   ezetimibe  10 mg Oral Daily   ferrous sulfate  325 mg Oral Q breakfast   furosemide  40 mg Intravenous BID   insulin aspart  0-15 Units Subcutaneous TID WC   irbesartan  300 mg Oral Daily   melatonin  3 mg Oral Once   pantoprazole  40 mg Oral Daily   polyethylene glycol  17 g Oral BID   sodium chloride flush  3 mL Intravenous Q12H   spironolactone  25 mg Oral Daily   traZODone  50 mg Oral QHS   venlafaxine XR  150 mg Oral Q breakfast   Continuous Infusions:  sodium chloride 10 mL/hr at 03/25/22 1200   sodium chloride       LOS: 7 days    Time spent: 34mn  PDomenic Polite MD [facility]   03/31/2022, 11:40 AM

## 2022-03-31 NOTE — Progress Notes (Signed)
Heart Failure Navigator Progress Note  Assessed for Heart & Vascular TOC clinic readiness.  Patient has a Hospital follow up with [facility] Dr. Martinique on 04/11/2022.   Navigator available for reassessment of patient.   Earnestine Leys, BSN, Clinical cytogeneticist Only

## 2022-04-01 DIAGNOSIS — I4901 Ventricular fibrillation: Secondary | ICD-10-CM | POA: Diagnosis not present

## 2022-04-01 DIAGNOSIS — I10 Essential (primary) hypertension: Secondary | ICD-10-CM | POA: Diagnosis not present

## 2022-04-01 DIAGNOSIS — N179 Acute kidney failure, unspecified: Secondary | ICD-10-CM | POA: Diagnosis not present

## 2022-04-01 DIAGNOSIS — I5033 Acute on chronic diastolic (congestive) heart failure: Secondary | ICD-10-CM | POA: Diagnosis not present

## 2022-04-01 DIAGNOSIS — D649 Anemia, unspecified: Secondary | ICD-10-CM | POA: Diagnosis not present

## 2022-04-01 LAB — BASIC METABOLIC PANEL
Anion gap: 13 (ref 5–15)
BUN: 27 mg/dL — ABNORMAL HIGH (ref 8–23)
CO2: 24 mmol/L (ref 22–32)
Calcium: 8.9 mg/dL (ref 8.9–10.3)
Chloride: 98 mmol/L (ref 98–111)
Creatinine, Ser: 1.33 mg/dL — ABNORMAL HIGH (ref 0.61–1.24)
GFR, Estimated: 55 mL/min — ABNORMAL LOW (ref >60)
Glucose, Bld: 97 mg/dL (ref 70–99)
Potassium: 3.9 mmol/L (ref 3.5–5.1)
Sodium: 135 mmol/L (ref 135–145)

## 2022-04-01 LAB — GLUCOSE, CAPILLARY
Glucose-Capillary: 144 mg/dL — ABNORMAL HIGH (ref 70–99)
Glucose-Capillary: 117 mg/dL — ABNORMAL HIGH (ref 70–99)
Glucose-Capillary: 97 mg/dL (ref 70–99)
Glucose-Capillary: 107 mg/dL — ABNORMAL HIGH (ref 70–99)

## 2022-04-01 LAB — CBC
HCT: 29.2 % — ABNORMAL LOW (ref 39.0–52.0)
Hemoglobin: 9.3 g/dL — ABNORMAL LOW (ref 13.0–17.0)
MCH: 26.6 pg (ref 26.0–34.0)
MCHC: 31.8 g/dL (ref 30.0–36.0)
MCV: 83.4 fL (ref 80.0–100.0)
Platelets: 314 10*3/uL (ref 150–400)
RBC: 3.5 MIL/uL — ABNORMAL LOW (ref 4.22–5.81)
RDW: 15.8 % — ABNORMAL HIGH (ref 11.5–15.5)
WBC: 8.2 10*3/uL (ref 4.0–10.5)
nRBC: 0 % (ref 0.0–0.2)

## 2022-04-01 LAB — MAGNESIUM: Magnesium: 2.2 mg/dL (ref 1.7–2.4)

## 2022-04-01 MED ORDER — HYDRALAZINE HCL 25 MG PO TABS
25.0000 mg | ORAL_TABLET | Freq: Two times a day (BID) | ORAL | Status: DC
  Administered 2022-04-01: 25 mg via ORAL
  Filled 2022-04-01: qty 1

## 2022-04-01 MED ORDER — AMLODIPINE BESYLATE 10 MG PO TABS: 10.0000 mg | ORAL_TABLET | Freq: Every day | ORAL | Status: DC

## 2022-04-01 MED ORDER — POTASSIUM CHLORIDE CRYS ER 20 MEQ PO TBCR
40.0000 meq | EXTENDED_RELEASE_TABLET | Freq: Once | ORAL | Status: AC
  Administered 2022-04-01: 40 meq via ORAL
  Filled 2022-04-01: qty 2

## 2022-04-01 MED ORDER — FUROSEMIDE 10 MG/ML IJ SOLN: 60.0000 mg | Freq: Two times a day (BID) | INTRAMUSCULAR | Status: DC

## 2022-04-01 MED ORDER — HYDRALAZINE HCL 25 MG PO TABS
25.0000 mg | ORAL_TABLET | Freq: Three times a day (TID) | ORAL | Status: DC
  Administered 2022-04-01 – 2022-04-04 (×9): 25 mg via ORAL
  Filled 2022-04-01 (×9): qty 1

## 2022-04-01 MED ORDER — AMLODIPINE BESYLATE 5 MG PO TABS
5.0000 mg | ORAL_TABLET | Freq: Every day | ORAL | Status: DC
  Administered 2022-04-01 – 2022-04-03 (×3): 5 mg via ORAL
  Filled 2022-04-01 (×3): qty 1

## 2022-04-01 MED ORDER — FUROSEMIDE 40 MG PO TABS
40.0000 mg | ORAL_TABLET | Freq: Every day | ORAL | Status: DC
  Administered 2022-04-01: 40 mg via ORAL
  Filled 2022-04-01: qty 1

## 2022-04-01 NOTE — Progress Notes (Signed)
Heart Failure Nurse Navigator Progress Note  PCP: [name], MD PCP-Cardiologist: [place] Admission Diagnosis: Symptomatic anemia, Ventricular fibrillation.  Admitted from: Home via EMS  Presentation:   Aaron [name] presented with 1 week hx of shortness of breath and dizziness, admitted for fall where he striked his head on the ground, got up and went to bed, later that evening couldn't get up and move so EMS was called, where he had soft blood pressures and ST depression on the monitor. BP 91/63, HR 77, BMI 40.67, Bilateral lower extremity edema, BNP 178.8, Troponin 5,431, and HGB 4.8. In ED patient had a V-fib arrest required CPR and defibrillation x1, received 3 units of PRBC, tx to ICU, EGD on 9/21 showed Graves disease treated with APC, 9/22 underwent coronary angiogram which noted mild to moderate obstructive CAD. 9/26 a IVC filter was placed and patient potentially may be sent home with life vest.   Patient was educated on the sign and symptoms of heart failure, daily weights, when to cll his doctor or go to the ER, Diet/ fluid restrictions, taking all his medications a s prescribed and attending all medical appointments. Patient stated that he understood the education and he would have his son bring him to his scheduled HF TOC appointment on 04/06/2022 @ 3 pm.   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"   Heart murmur    Hyperlipidemia    Hypertension    Insomnia    Osteoarthritis    PONV (postoperative nausea and vomiting)    2006 - knee surgery   Pre-diabetes    Psoriasis    PTSD (post-traumatic stress  disorder)    Pulmonary embolism ([place]) ~ 03/2006   bilaterally   Seasonal allergies    Shortness of breath 06/08/11   "w/exertion; that's why I'm here"   Sleep apnea, obstructive    Umbilical hernia      Social History   Socioeconomic History   Marital status: Married    Spouse name: Not on file   Number of children: 3   Years of education: Not on file   Highest education level: Not on file  Occupational History   Occupation: building maintenance    Comment: disabled  Tobacco Use   Smoking status: Former    Packs/day: 1.50    Years: 25.00    Total pack years: 37.50    Types: Cigarettes    Quit date: 07/10/1995    Years since quitting: 26.7   Smokeless tobacco: Never  Vaping Use   Vaping Use: Never used  Substance and Sexual Activity   Alcohol use: No   Drug use: No   Sexual activity: Not on file  Other Topics Concern   Not on file  Social History Narrative   Former Curator, 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   Recent stress, currently undergoing financial difficulties.   Social Determinants of Health   Financial Resource Strain: Low Risk  (10/29/2021)   Overall  Financial Resource Strain (CARDIA)    Difficulty of Paying Living Expenses: Not hard at all  Food Insecurity: No Food Insecurity (03/29/2022)   Hunger Vital Sign    Worried About Running Out of Food in the Last Year: Never true    Ran Out of Food in the Last Year: Never true  Transportation Needs: No Transportation Needs (03/29/2022)   PRAPARE - Hydrologist (Medical): No    Lack of Transportation (Non-Medical): No  Physical Activity: Insufficiently Active (10/29/2021)   Exercise Vital Sign    Days of Exercise per Week: 3 days    Minutes of Exercise per Session: 30 min  Stress: No Stress Concern Present (10/29/2021)   [place]    Feeling of Stress : Not at all  Social Connections: Socially Isolated  (10/29/2021)   Social Connection and Isolation Panel [NHANES]    Frequency of Communication with Friends and Family: Three times a week    Frequency of Social Gatherings with Friends and Family: Three times a week    Attends Religious Services: Never    Active Member of Clubs or Organizations: No    Attends Archivist Meetings: Never    Marital Status: Widowed   Education Assessment and Provision:  Detailed education and instructions provided on heart failure disease management including the following:  Signs and symptoms of Heart Failure When to call the physician Importance of daily weights Low sodium diet Fluid restriction Medication management Anticipated future follow-up appointments  Patient education given on each of the above topics.  Patient acknowledges understanding via teach back method and acceptance of all instructions.  Education Materials:  "Living Better With Heart Failure" Booklet, HF zone tool, & Daily Weight Tracker Tool.  Patient has scale at home: yes Patient has pill box at home: NA    High Risk Criteria for Readmission and/or Poor Patient Outcomes: Heart failure hospital admissions (last 6 months): 0  No Show rate: 1 Difficult social situation: No Demonstrates medication adherence: Yes Primary Language: English Literacy level: Reading, writing, and comprehension  Barriers of Care:   Diet/ fluid restrictions ( Diet coke) Daily weights  Considerations/Referrals:   Referral made to Heart Failure Pharmacist Stewardship: Yes Referral made to Heart Failure CSW/NCM TOC: No Referral made to Heart & Vascular TOC clinic: yes, 04/06/22 @ 3 pm  Items for Follow-up on DC/TOC: Diet/ fluid restrictions ( Salt) Daily weights   Earnestine Leys, BSN, RN Heart Failure Transport planner Only

## 2022-04-01 NOTE — Progress Notes (Addendum)
Rounding Note    Patient Name: Aaron [name] Date of Encounter: 04/01/2022  [place] Cardiologist: Peter [place], MD   Subjective   Pt seen sitting on side of bed, having issues with rib pain and coughing. He reports not receiving lasix on time, reports urinating often.  Inpatient Medications    Scheduled Meds:  amLODipine  5 mg Oral QHS   aspirin EC  81 mg Oral Daily   atorvastatin  80 mg Oral Daily   clonazePAM  0.5 mg Oral QHS   empagliflozin  10 mg Oral Daily   ezetimibe  10 mg Oral Daily   ferrous sulfate  325 mg Oral Q breakfast   furosemide  60 mg Intravenous BID   insulin aspart  0-15 Units Subcutaneous TID WC   melatonin  3 mg Oral Once   pantoprazole  40 mg Oral Daily   polyethylene glycol  17 g Oral BID   potassium chloride  40 mEq Oral Once   sodium chloride flush  3 mL Intravenous Q12H   spironolactone  25 mg Oral Daily   traZODone  50 mg Oral QHS   venlafaxine XR  150 mg Oral Q breakfast   Continuous Infusions:  sodium chloride 10 mL/hr at 03/25/22 1200   sodium chloride     PRN Meds: sodium chloride, sodium chloride, acetaminophen, ALPRAZolam, alum & mag hydroxide-simeth, docusate sodium, HYDROcodone-acetaminophen, HYDROmorphone (DILAUDID) injection, ondansetron (ZOFRAN) IV, sodium chloride flush   Vital Signs    Vitals:   03/31/22 0700 03/31/22 0823 03/31/22 2045 04/01/22 0347  BP: (!) 155/72  (!) 156/77 (!) 161/82  Pulse: 88 85  80  Resp: '16 16 16 16  '$ Temp: 98.6 F (37 C) 98.1 F (36.7 C) 98.3 F (36.8 C) 97.8 F (36.6 C)  TempSrc: Oral Oral Oral Oral  SpO2:  98% 100% 97%  Weight: 107.7 kg   106 kg  Height:        Intake/Output Summary (Last 24 hours) at 04/01/2022 0740 Last data filed at 03/31/2022 2201 Gross per 24 hour  Intake --  Output 800 ml  Net -800 ml      04/01/2022    3:47 AM 03/31/2022    7:00 AM 03/30/2022    7:15 AM  Last 3 Weights  Weight (lbs) 233 lb 11.2 oz 237 lb 6.4 oz 241 lb  Weight (kg) 106.006  kg 107.684 kg 109.317 kg      Telemetry    Sinus rhythm in the 60-80s - Personally Reviewed  ECG    No new tracings - Personally Reviewed  Physical Exam   GEN: No acute distress.   Neck: No JVD Cardiac: RRR, no murmurs, rubs, or gallops.  Respiratory: productive cough, crackles in left base GI: Soft, nontender, non-distended  MS: LE edema appears improved, but still 2+ pitting B  Neuro:  Nonfocal  Psych: Normal affect   Labs    High Sensitivity Troponin:   Recent Labs  Lab 03/24/22 0415 03/24/22 0631  TROPONINIHS 5,431* 3,894*     Chemistry Recent Labs  Lab 03/30/22 0315 03/30/22 1832 03/31/22 0223 04/01/22 0144  NA 138  --  138 135  K 3.8 4.7 4.0 3.9  CL 104  --  101 98  CO2 25  --  26 24  GLUCOSE 102*  --  102* 97  BUN 17  --  21 27*  CREATININE 0.90  --  1.01 1.33*  CALCIUM 8.6*  --  8.9 8.9  MG  --   --  2.1 2.2  GFRNONAA >60  --  >60 55*  ANIONGAP 9  --  11 13    Lipids No results for input(s): "CHOL", "TRIG", "HDL", "LABVLDL", "LDLCALC", "CHOLHDL" in the last 168 hours.  Hematology Recent Labs  Lab 03/30/22 0315 03/31/22 0223 04/01/22 0144  WBC 5.9 7.0 8.2  RBC 3.26* 3.31* 3.50*  HGB 8.5* 8.7* 9.3*  HCT 27.7* 28.3* 29.2*  MCV 85.0 85.5 83.4  MCH 26.1 26.3 26.6  MCHC 30.7 30.7 31.8  RDW 15.8* 15.9* 15.8*  PLT 317 309 314   Thyroid No results for input(s): "TSH", "FREET4" in the last 168 hours.  BNP Recent Labs  Lab 03/28/22 0243  BNP 167.3*    DDimer No results for input(s): "DDIMER" in the last 168 hours.   Radiology    No results found.  Cardiac Studies   Left heart cath 03/25/22:   Dist LAD lesion is 10% stenosed.   1st Mrg lesion is 30% stenosed.   1.  Diffuse mild to moderate obstructive disease with patent mid LAD stent. 2.  Elevated LVEDP of 25 mmHg.   Commendation: Continued medical therapy.     Echo 03/26/22:  1. Study not well visualized to full assess endocardial borders. Left  ventricular ejection fraction,  by estimation, is 55 to 60%. The left  ventricle has normal function. The left ventricle has no regional wall  motion abnormalities. There is mild left  ventricular hypertrophy. Left ventricular diastolic parameters were  normal.   2. Right ventricular systolic function is normal. The right ventricular  size is normal. There is normal pulmonary artery systolic pressure.   3. The mitral valve is normal in structure. No evidence of mitral valve  regurgitation.   4. The aortic valve was not well visualized. Aortic valve regurgitation  is not visualized.   5. The inferior vena cava is normal in size with greater than 50%  respiratory variability, suggesting right atrial pressure of 3 mmHg.       Patient Profile     76 y.o. 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 Patient presented to the ED after a ground level fall at home in the setting of worsening dyspnea, dizziness, weakness, and pain. Initial ECG notable for ST depression. Hemoglobin noted low at 4.8. While in the ED, patient had a VF arrest, received CPR and defibrillation x1.  Assessment & Plan    Vfib arrest Nonobstructive CAD NSTEMI felt due to demand ischemia Acute blood los anemia in the setting of GI bleed Patient suffered an in-hospital VF arrest in the [facility] ED in the setting of GI bleed with hemoglobin of 4.8.  LHC revealed nonobstructive CAD, no intervention.  Telemetry was sinus rhythm.  Suspect reversible etiology in the setting of severe anemia.  However, felt that risk for rebleeding and LifeVest was recommended at discharge.  Xarelto held, IVC filter in place. - lifevest should be fitted this evening   Acute on chronic diastolic heart failure Hypertension Home medications initially held on admission.  BP  medications titrated to 300 mg irbesartan (was on telmisartan-HCTZ PTA) and amlodipine Spironolactone was added for continued hypertension. Due to creatinine bump, irbesartan and lasix were discontinued last night. Pt remains with LE edema Weight is down another 3 lbs overnight, but only 800 cc urine output charted  yesterday - had nearly 4 L urine output the day before. - he did receive 2 doses of 40 mg IV lasix yesterday - transition IV lasix to 40 mg PO lasix today - will need to be cautious with rising Cr - will start 25 mg hydralazine TID - continue jardiance, spironolactone.  Would add back ARB tomorrow if stable renal function   AKI sCr 1.33 (1.01) BUN 27 (21) He continues to urinate   Acute blood loss anemia GI bleed Hx of recurrent DVT/PE Hb stable 9.3 (8.7) Xarelto on hold IVC filter in place   Will be fitted with lifevest today.       For questions or updates, please contact [place] Please consult [email or web address] for contact info under      Signed, Ledora Bottcher, PA  04/01/2022, 7:40 AM     Patient seen and examined.  Agree with above documentation.  On exam, patient is alert and oriented, regular rate and rhythm, no murmurs, lungs CTAB, 1+ LE edema.  Planning to be fitted with a LifeVest later today.  Mild bump in creatinine, will switch to p.o. Lasix.  Still with LE edema on exam but otherwise appears euvolemic, weight is down 20lbs from admit.  Recommend compression stockings for edema.  Donato Heinz, MD

## 2022-04-01 NOTE — Progress Notes (Signed)
PROGRESS NOTE    Aaron [name]  ZOX:[id number] DOB: [birth year] DOA: 03/24/2022 PCP: [name], MD  76/M with history of hypertension, recurrent DVT/PE on Xarelto, obesity, iron deficiency anemia requiring transfusions, CAD with PCI to LAD in 2005 presented to the ED after a fall, recent history of worsening dyspnea dizziness and weakness.  EKG noted ST depression, hemoglobin was 4.8, in the ED patient had V-fib arrest required CPR and defibrillation X1 -Admitted to the ICU received 3 units of PRBC -EGD 9/21 noted Graves' disease treated with APC 9/22 underwent coronary angiography which noted mild to moderate obstructive CAD 9/22: Extubated Transferred to [place] to be high risk to resume full dose anticoagulation, IR was consulted -9/26 IVC filter placed Cardiology recommends LifeVest prior to discharge  Subjective: -Still has moderate amount of swelling, overall improving, breathing better  Assessment and Plan:  S/p Ventricular fibrillation arrest Cardiac arrest Vfib, due to severe anemia due to acute blood loss anemia, due to upper GI bleed.  Cards following, left heart cath without acute findings, nonobstructive CAD -Echo noted preserved EF without wall motion abnormalities -Transfused PRBC, hemoglobin stable -Cardiology recommends LifeVest at discharge -Ambulate, PT eval completed  GAVE (gastric antral vascular ectasia) Acute blood loss anemia Transfused 3 units of PRBC -EGD noted GAVE's disease, treated with APC -Considered high risk of bleeding, continue PPI, follow-up with gastroenterology -Anticoagulation discontinued  Acute on chronic diastolic CHF (congestive heart failure) (HCC) Echocardiogram with preserved LV systolic function with EF 55 to 60%, mild LVH, RV with preserved systolic function.  -Improving with diuresis, he is 7.1 L negative, still volume overloaded, continue IV Lasix 1-2 more days  -Bump in creatinine noted will hold ARB  -Continue  Aldactone and empagliflozin  -BMP in a.m.  History of pulmonary embolism CT chest - 2012 with bilateral pulmonary embolism with significant clot burden, right ventricular strain pattern.  -2015 acute occlusive deep vein thrombosis involving peroneal and posterior tibial veins of the left lower extremity. Acute non occlusive deep vein thrombosis noted in the popliteal vein of the left lower extremity.  -On account of severe blood loss anemia, Graves' disease and V-fib arrest, and high risk of recurrent bleeding -Decision was made to discontinue anticoagulation -IVC filter placed in interventional radiology 9/26 -Down the road filter could be removed and restarted on anticoagulation  Acute kidney injury superimposed on chronic kidney disease (HCC) CKD stage 2 , hyponatremia  -mild worsening, hold ARB, continue current diuretics  Pre-diabetes Continue glucose cover and monitoring with insulin sliding scale.  Fasting glucose this am is 101 mg/dl.   Depression Anxiety. -continue with venlafaxine, clonazepam and trazodone PRN alprazolam during his hospitalization   Class 1 obesity Calculated BMI is 33,1   DVT prophylaxis: SCD Code Status: Full code Family Communication: Patient and wife at bedside Disposition Plan: Home [place] 48h  Consultants: Neurology   Procedures: 03/25/2022 LHC     Dist LAD lesion is 10% stenosed.   1st Mrg lesion is 30% stenosed.   1.  Diffuse mild to moderate obstructive disease with patent mid LAD stent. 2.  Elevated LVEDP of 25 mmHg.  Antimicrobials:    Objective: Vitals:   03/31/22 0700 03/31/22 0823 03/31/22 2045 04/01/22 0347  BP: (!) 155/72  (!) 156/77 (!) 161/82  Pulse: 88 85  80  Resp: '16 16 16 16  '$ Temp: 98.6 F (37 C) 98.1 F (36.7 C) 98.3 F (36.8 C) 97.8 F (36.6 C)  TempSrc: Oral Oral Oral Oral  SpO2:  98% 100% 97%  Weight: 107.7 kg   106 kg  Height:        Intake/Output Summary (Last 24 hours) at 04/01/2022 1120 Last data  filed at 04/01/2022 0700 Gross per 24 hour  Intake --  Output 1250 ml  Net -1250 ml   Filed Weights   03/30/22 0715 03/31/22 0700 04/01/22 0347  Weight: 109.3 kg 107.7 kg 106 kg    Examination:  General exam: Pleasant elderly male sitting up in bed, AAOx3, no distress HEENT: Positive JVD CVS: S1-S2, regular rhythm Lungs: Decreased breath sounds the bases Abdomen: Soft, obese, nontender, bowel sounds present Extremities: 2+ edema  Skin: No rashes Psychiatry:  Mood & affect appropriate.     Data Reviewed:   CBC: Recent Labs  Lab 03/25/22 1943 03/26/22 0339 03/27/22 0424 03/30/22 0315 03/31/22 0223 04/01/22 0144  WBC 13.8* 10.9*  --  5.9 7.0 8.2  HGB 8.7* 8.8* 8.5* 8.5* 8.7* 9.3*  HCT 27.5* 27.8* 27.6* 27.7* 28.3* 29.2*  MCV 84.1 84.8  --  85.0 85.5 83.4  PLT 314 311  --  317 309 378   Basic Metabolic Panel: Recent Labs  Lab 03/28/22 0243 03/29/22 0222 03/30/22 0315 03/30/22 1832 03/31/22 0223 04/01/22 0144  NA 139 140 138  --  138 135  K 4.3 4.0 3.8 4.7 4.0 3.9  CL 107 107 104  --  101 98  CO2 '23 23 25  '$ --  26 24  GLUCOSE 101* 96 102*  --  102* 97  BUN 27* 25* 17  --  21 27*  CREATININE 1.16 1.08 0.90  --  1.01 1.33*  CALCIUM 8.4* 8.2* 8.6*  --  8.9 8.9  MG  --   --   --   --  2.1 2.2   GFR: Estimated Creatinine Clearance: 53.9 mL/min (A) (by C-G formula based on SCr of 1.33 mg/dL (H)). Liver Function Tests: No results for input(s): "AST", "ALT", "ALKPHOS", "BILITOT", "PROT", "ALBUMIN" in the last 168 hours.  No results for input(s): "LIPASE", "AMYLASE" in the last 168 hours. No results for input(s): "AMMONIA" in the last 168 hours. Coagulation Profile: No results for input(s): "INR", "PROTIME" in the last 168 hours. Cardiac Enzymes: No results for input(s): "CKTOTAL", "CKMB", "CKMBINDEX", "TROPONINI" in the last 168 hours. BNP (last 3 results) No results for input(s): "PROBNP" in the last 8760 hours. HbA1C: No results for input(s): "HGBA1C" in  the last 72 hours. CBG: Recent Labs  Lab 03/31/22 0728 03/31/22 1204 03/31/22 1645 03/31/22 2157 04/01/22 0820  GLUCAP 101* 99 106* 115* 144*   Lipid Profile: No results for input(s): "CHOL", "HDL", "LDLCALC", "TRIG", "CHOLHDL", "LDLDIRECT" in the last 72 hours. Thyroid Function Tests: No results for input(s): "TSH", "T4TOTAL", "FREET4", "T3FREE", "THYROIDAB" in the last 72 hours. Anemia Panel: No results for input(s): "VITAMINB12", "FOLATE", "FERRITIN", "TIBC", "IRON", "RETICCTPCT" in the last 72 hours. Urine analysis:    Component Value Date/Time   COLORURINE YELLOW 03/25/2016 1510   APPEARANCEUR CLEAR 03/25/2016 1510   LABSPEC 1.013 03/25/2016 1510   PHURINE 7.0 03/25/2016 1510   GLUCOSEU NEGATIVE 03/25/2016 1510   HGBUR NEGATIVE 03/25/2016 1510   BILIRUBINUR NEGATIVE 03/25/2016 1510   KETONESUR NEGATIVE 03/25/2016 1510   PROTEINUR 30 (A) 03/25/2016 1510   NITRITE NEGATIVE 03/25/2016 1510   LEUKOCYTESUR NEGATIVE 03/25/2016 1510   Sepsis Labs: '@LABRCNTIP'$ (procalcitonin:4,lacticidven:4)  ) Recent Results (from the past 240 hour(s))  MRSA Next Gen by PCR, Nasal     Status: None   Collection Time:  03/24/22  5:31 PM   Specimen: Nasal Mucosa; Nasal Swab  Result Value Ref Range Status   MRSA by PCR Next Gen NOT DETECTED NOT DETECTED Final    Comment: (NOTE) The GeneXpert MRSA Assay (FDA approved for NASAL specimens only), is one component of a comprehensive MRSA colonization surveillance program. It is not intended to diagnose MRSA infection nor to guide or monitor treatment for MRSA infections. Test performance is not FDA approved in patients less than 52 years old. Performed at [facility], [place] [address] [place], [place] [address]      Radiology Studies: No results found.   Scheduled Meds:  amLODipine  10 mg Oral QHS   aspirin EC  81 mg Oral Daily   atorvastatin  80 mg Oral Daily   clonazePAM  0.5 mg Oral QHS   empagliflozin  10 mg Oral Daily    ezetimibe  10 mg Oral Daily   ferrous sulfate  325 mg Oral Q breakfast   furosemide  40 mg Oral Daily   hydrALAZINE  25 mg Oral BID   insulin aspart  0-15 Units Subcutaneous TID WC   melatonin  3 mg Oral Once   pantoprazole  40 mg Oral Daily   polyethylene glycol  17 g Oral BID   sodium chloride flush  3 mL Intravenous Q12H   spironolactone  25 mg Oral Daily   traZODone  50 mg Oral QHS   venlafaxine XR  150 mg Oral Q breakfast   Continuous Infusions:  sodium chloride 10 mL/hr at 03/25/22 1200   sodium chloride       LOS: 8 days    Time spent: 14mn  PDomenic Polite MD [facility]   04/01/2022, 11:20 AM

## 2022-04-01 NOTE — TOC Progression Note (Signed)
Transition of Care [facility]) - Progression Note    Patient Details  Name: CHIP CANEPA MRN: [id number] Date of Birth: 09-04-1945  [facility] CM/SW Contact  Graves-Bigelow, Ocie Cornfield, RN Phone Number: 04/01/2022, 2:08 PM  Clinical Narrative:  Case Manager spoke with Claiborne Billings and she will be here at 10:30 am to fit the patient for the Life Vest. Spouse will arrive at that time for education. No further needs identified at this time.   Expected Discharge Plan: [place] Barriers to Discharge: Continued Medical Work up  Expected Discharge Plan and Services Expected Discharge Plan: [place] In-house Referral: NA Discharge Planning Services: CM Consult Post Acute Care Choice: Home Health, Durable Medical Equipment Living arrangements for the past 2 months: Single Family Home                 DME Arranged: [place] DME Agency: [employer] Date DME Agency Contacted: 03/29/22 Time DME Agency Contacted: [phone] Representative spoke with at DME Agency: [place]: PT [place]: [place] Date [place]: 03/29/22 Time [place]: 1600 Representative spoke with at [place]: [place] ([place]) Interventions Alcohol Usage Interventions: Intervention Not Indicated (Score <7)  Readmission Risk Interventions     No data to display

## 2022-04-01 NOTE — Progress Notes (Signed)
Physical Therapy Treatment Patient Details Name: Aaron [name] MRN: [id number] DOB: 1945/12/07 Today's Date: 04/01/2022   History of Present Illness Pt is a 76 y.o. male admitted 03/24/22 with dizziness and fall. Workup for significant anemia, concern for GIB. Brief vfib arrest in ED. S/p upper endoscopy 9/21. S/p LHC 9/22. ETT 9/21-9/22. 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    PT Comments    Pt making good progress.  He was able to ambulate 200' with all VSS.  Min cues for RW proximity and adjusted home RW to correct height.  Pt only has a couple steps to enter home - worked on high stepping with walking to progress to stairs.  Continue plan of care.     Recommendations for follow up therapy are one component of a multi-disciplinary discharge planning process, led by the attending physician.  Recommendations may be updated based on patient status, additional functional criteria and insurance authorization.  Follow Up Recommendations  Home health PT     Assistance Recommended at Discharge Intermittent Supervision/Assistance  Patient can return home with the following A little help with bathing/dressing/bathroom;Assistance with cooking/housework;Assist for transportation   Equipment Recommendations  Rolling walker (2 wheels) (already in room)    Recommendations for Other Services       Precautions / Restrictions Precautions Precautions: Fall;Other (comment)     Mobility  Bed Mobility               General bed mobility comments: pt sitting EOB upon PT arrival    Transfers Overall transfer level: Needs assistance Equipment used: Rolling walker (2 wheels), None Transfers: Sit to/from Stand Sit to Stand: Supervision           General transfer comment: no physical assist needed with sit>stand, supervision for safety; performed x 2 with and without RW     Ambulation/Gait Ambulation/Gait assistance: Supervision Gait Distance (Feet): 180 Feet Assistive device: Rolling walker (2 wheels) Gait Pattern/deviations: Step-through pattern Gait velocity: reduced     General Gait Details: Min cues for RW proximity and posture; adjusted home RW to correct height   Stairs Stairs:  (Worked on high stepping for progression to stairs.  Each leg x 5 with ambulation)           Wheelchair Mobility    Modified Rankin (Stroke Patients Only)       Balance Overall balance assessment: Needs assistance Sitting-balance support: No upper extremity supported, Feet supported Sitting balance-Leahy Scale: Normal     Standing balance support: No upper extremity supported, Bilateral upper extremity supported Standing balance-Leahy Scale: Good Standing balance comment: Ambulated with RW but able to take a couple steps without                            Cognition Arousal/Alertness: Awake/alert Behavior During Therapy: WFL for tasks assessed/performed Overall Cognitive Status: Within Functional Limits for tasks assessed                                          Exercises      General Comments General comments (skin integrity, edema, etc.): VSS on RA; HR 90's with activity      Pertinent Vitals/Pain Pain Assessment Pain Assessment: Faces Faces Pain Scale: Hurts a little bit Pain Location: R rib pain  from CPR Pain Descriptors / Indicators: Sore Pain Intervention(s): Monitored during session    Home Living                          Prior Function            PT Goals (current goals can now be found in the care plan section) Acute Rehab PT Goals Patient Stated Goal: return home, willing to consider HHPT Progress towards PT goals: Progressing toward goals    Frequency    Min 3X/week      PT Plan Current plan remains appropriate    Co-evaluation              AM-PAC PT "6 Clicks"  Mobility   Outcome Measure  Help needed turning from your back to your side while in a flat bed without using bedrails?: None Help needed moving from lying on your back to sitting on the side of a flat bed without using bedrails?: None Help needed moving to and from a bed to a chair (including a wheelchair)?: A Little Help needed standing up from a chair using your arms (e.g., wheelchair or bedside chair)?: A Little Help needed to walk in hospital room?: A Little Help needed climbing 3-5 steps with a railing? : A Little 6 Click Score: 20    End of Session Equipment Utilized During Treatment: Gait belt Activity Tolerance: Patient tolerated treatment well Patient left: in bed;with call bell/phone within reach Nurse Communication: Mobility status PT Visit Diagnosis: Other abnormalities of gait and mobility (R26.89);Muscle weakness (generalized) (M62.81)     Time: 4580-9983 PT Time Calculation (min) (ACUTE ONLY): 11 min  Charges:  $Gait Training: 8-22 mins                     Abran Richard, PT Acute Rehab [facility] [place] 04/01/2022, 4:46 PM

## 2022-04-01 NOTE — Progress Notes (Signed)
Mobility Specialist - Progress Note   04/01/22 1451  Mobility  Activity Refused mobility   Pt refused mobility d/t being sleep and hard to waken. Will follow up if time permits.   Larey Seat

## 2022-04-01 NOTE — Progress Notes (Signed)
OT Cancellation Note  Patient Details Name: Aaron [name] MRN: [id number] DOB: January 13, 1946   Cancelled Treatment:     Patient declining OT treatment this date 2/2 c/o abdominal pain and LE swelling from not having his Lasix.  Will re-attempt as able  Hadley Pen 04/01/2022, 11:23 AM

## 2022-04-02 DIAGNOSIS — N179 Acute kidney failure, unspecified: Secondary | ICD-10-CM | POA: Diagnosis not present

## 2022-04-02 DIAGNOSIS — I5021 Acute systolic (congestive) heart failure: Secondary | ICD-10-CM | POA: Diagnosis not present

## 2022-04-02 DIAGNOSIS — I4901 Ventricular fibrillation: Secondary | ICD-10-CM | POA: Diagnosis not present

## 2022-04-02 DIAGNOSIS — D649 Anemia, unspecified: Secondary | ICD-10-CM | POA: Diagnosis not present

## 2022-04-02 DIAGNOSIS — K31811 Angiodysplasia of stomach and duodenum with bleeding: Secondary | ICD-10-CM | POA: Diagnosis not present

## 2022-04-02 DIAGNOSIS — I5033 Acute on chronic diastolic (congestive) heart failure: Secondary | ICD-10-CM | POA: Diagnosis not present

## 2022-04-02 LAB — GLUCOSE, CAPILLARY
Glucose-Capillary: 87 mg/dL (ref 70–99)
Glucose-Capillary: 104 mg/dL — ABNORMAL HIGH (ref 70–99)
Glucose-Capillary: 133 mg/dL — ABNORMAL HIGH (ref 70–99)
Glucose-Capillary: 123 mg/dL — ABNORMAL HIGH (ref 70–99)

## 2022-04-02 LAB — BASIC METABOLIC PANEL
Anion gap: 13 (ref 5–15)
BUN: 27 mg/dL — ABNORMAL HIGH (ref 8–23)
CO2: 26 mmol/L (ref 22–32)
Calcium: 9 mg/dL (ref 8.9–10.3)
Chloride: 101 mmol/L (ref 98–111)
Creatinine, Ser: 1.12 mg/dL (ref 0.61–1.24)
GFR, Estimated: >60 mL/min (ref >60)
Glucose, Bld: 93 mg/dL (ref 70–99)
Potassium: 4.3 mmol/L (ref 3.5–5.1)
Sodium: 140 mmol/L (ref 135–145)

## 2022-04-02 MED ORDER — FUROSEMIDE 40 MG PO TABS: 40.0000 mg | ORAL_TABLET | Freq: Every day | ORAL | Status: DC

## 2022-04-02 MED ORDER — FUROSEMIDE 10 MG/ML IJ SOLN
60.0000 mg | Freq: Once | INTRAMUSCULAR | Status: AC
  Administered 2022-04-02: 60 mg via INTRAVENOUS
  Filled 2022-04-02: qty 6

## 2022-04-02 NOTE — Progress Notes (Signed)
PROGRESS NOTE    Aaron [name]  CXK:[id number] DOB: 14-May-1946 DOA: 03/24/2022 PCP: [name], MD  76/M with history of hypertension, recurrent DVT/PE on Xarelto, obesity, iron deficiency anemia requiring transfusions, CAD with PCI to LAD in 2005 presented to the ED after a fall, recent history of worsening dyspnea dizziness and weakness.  EKG noted ST depression, hemoglobin was 4.8, in the ED patient had V-fib arrest required CPR and defibrillation X1 -Admitted to the ICU received 3 units of PRBC -EGD 9/21 noted Graves' disease treated with APC 9/22 underwent coronary angiography which noted mild to moderate obstructive CAD 9/22: Extubated Transferred to [place] to be high risk to resume full dose anticoagulation, IR was consulted -9/26 IVC filter placed Cardiology recommends LifeVest prior to discharge  Subjective: -Feels better, awaiting LifeVest placement today, still has 2+ edema, overall improving  Assessment and Plan:  S/p Ventricular fibrillation arrest Cardiac arrest Vfib, due to severe anemia due to acute blood loss anemia, due to upper GI bleed.  Cards following, left heart cath without acute findings, nonobstructive CAD -Echo noted preserved EF without wall motion abnormalities -Transfused PRBC, hemoglobin stable -Cardiology recommends LifeVest at discharge, to be done today -Ambulate, PT eval completed, anticipate DC home tomorrow if stable  Acute on chronic diastolic CHF (congestive heart failure) (HCC) Echocardiogram with preserved LV systolic function with EF 55 to 60%, mild LVH, RV with preserved systolic function.  -he is 8.3 L negative, improving still with 2+ edema, TED hose ordered-pending, IV Lasix held yesterday with mild bump in creatinine, creatinine improved after ARB discontinued, creatinine down to 1.1, will repeat IV Lasix today -Continue Aldactone and empagliflozin  -BMP in a.m.  GAVE (gastric antral vascular ectasia) Acute blood loss  anemia Transfused 3 units of PRBC -EGD noted GAVE's disease, treated with APC -Considered high risk of bleeding, continue PPI, follow-up with gastroenterology -Anticoagulation discontinued  History of pulmonary embolism CT chest - 2012 with bilateral pulmonary embolism with significant clot burden, right ventricular strain pattern.  -2015 acute occlusive deep vein thrombosis involving peroneal and posterior tibial veins of the left lower extremity. Acute non occlusive deep vein thrombosis noted in the popliteal vein of the left lower extremity.  -On account of severe blood loss anemia, Graves' disease and V-fib arrest, and high risk of recurrent bleeding -Decision was made to discontinue anticoagulation -IVC filter placed in interventional radiology 9/26 -Down the road filter could be removed and restarted on anticoagulation  Acute kidney injury superimposed on chronic kidney disease (HCC) CKD stage 2 , hyponatremia  -Improved, holding ARB, continue IV Lasix today  Pre-diabetes Continue glucose cover and monitoring with insulin sliding scale.  Fasting glucose this am is 101 mg/dl.   Depression Anxiety. -continue with venlafaxine, clonazepam and trazodone PRN alprazolam during his hospitalization   Class 1 obesity Calculated BMI is 33,1   DVT prophylaxis: SCD Code Status: Full code Family Communication: Patient and wife at bedside Disposition Plan: Home likley tomorrow  Consultants: Neurology   Procedures: 03/25/2022 LHC     Dist LAD lesion is 10% stenosed.   1st Mrg lesion is 30% stenosed.   1.  Diffuse mild to moderate obstructive disease with patent mid LAD stent. 2.  Elevated LVEDP of 25 mmHg.  Antimicrobials:    Objective: Vitals:   04/01/22 1615 04/01/22 2026 04/02/22 0352 04/02/22 0355  BP: 138/79 (!) 145/67  (!) 154/79  Pulse: 79 82  85  Resp: '15 16  14  '$ Temp: 97.9 F (36.6  C) 98.2 F (36.8 C)  98.2 F (36.8 C)  TempSrc: Oral Oral  Oral  SpO2: 99% 95%   98%  Weight:   105.5 kg   Height:        Intake/Output Summary (Last 24 hours) at 04/02/2022 1122 Last data filed at 04/02/2022 0944 Gross per 24 hour  Intake 240 ml  Output 1850 ml  Net -1610 ml   Filed Weights   03/31/22 0700 04/01/22 0347 04/02/22 0352  Weight: 107.7 kg 106 kg 105.5 kg    Examination:  General exam: Pleasant elderly male sitting up in bed, AAOx3, no distress HEENT: Positive JVD CVS: S1-S2, regular rhythm Lungs: Decreased breath sounds the bases Abdomen: Soft, obese, nontender, bowel sounds present Extremities: 2+ edema  Skin: No rashes Psychiatry:  Mood & affect appropriate.     Data Reviewed:   CBC: Recent Labs  Lab 03/27/22 0424 03/30/22 0315 03/31/22 0223 04/01/22 0144  WBC  --  5.9 7.0 8.2  HGB 8.5* 8.5* 8.7* 9.3*  HCT 27.6* 27.7* 28.3* 29.2*  MCV  --  85.0 85.5 83.4  PLT  --  317 309 211   Basic Metabolic Panel: Recent Labs  Lab 03/29/22 0222 03/30/22 0315 03/30/22 1832 03/31/22 0223 04/01/22 0144 04/02/22 0158  NA 140 138  --  138 135 140  K 4.0 3.8 4.7 4.0 3.9 4.3  CL 107 104  --  101 98 101  CO2 23 25  --  '26 24 26  '$ GLUCOSE 96 102*  --  102* 97 93  BUN 25* 17  --  21 27* 27*  CREATININE 1.08 0.90  --  1.01 1.33* 1.12  CALCIUM 8.2* 8.6*  --  8.9 8.9 9.0  MG  --   --   --  2.1 2.2  --    GFR: Estimated Creatinine Clearance: 63.9 mL/min (by C-G formula based on SCr of 1.12 mg/dL). Liver Function Tests: No results for input(s): "AST", "ALT", "ALKPHOS", "BILITOT", "PROT", "ALBUMIN" in the last 168 hours.  No results for input(s): "LIPASE", "AMYLASE" in the last 168 hours. No results for input(s): "AMMONIA" in the last 168 hours. Coagulation Profile: No results for input(s): "INR", "PROTIME" in the last 168 hours. Cardiac Enzymes: No results for input(s): "CKTOTAL", "CKMB", "CKMBINDEX", "TROPONINI" in the last 168 hours. BNP (last 3 results) No results for input(s): "PROBNP" in the last 8760 hours. HbA1C: No results  for input(s): "HGBA1C" in the last 72 hours. CBG: Recent Labs  Lab 04/01/22 0820 04/01/22 1159 04/01/22 1613 04/01/22 2134 04/02/22 0722  GLUCAP 144* 117* 97 107* 87   Lipid Profile: No results for input(s): "CHOL", "HDL", "LDLCALC", "TRIG", "CHOLHDL", "LDLDIRECT" in the last 72 hours. Thyroid Function Tests: No results for input(s): "TSH", "T4TOTAL", "FREET4", "T3FREE", "THYROIDAB" in the last 72 hours. Anemia Panel: No results for input(s): "VITAMINB12", "FOLATE", "FERRITIN", "TIBC", "IRON", "RETICCTPCT" in the last 72 hours. Urine analysis:    Component Value Date/Time   COLORURINE YELLOW 03/25/2016 1510   APPEARANCEUR CLEAR 03/25/2016 1510   LABSPEC 1.013 03/25/2016 1510   PHURINE 7.0 03/25/2016 1510   GLUCOSEU NEGATIVE 03/25/2016 1510   HGBUR NEGATIVE 03/25/2016 1510   BILIRUBINUR NEGATIVE 03/25/2016 1510   KETONESUR NEGATIVE 03/25/2016 1510   PROTEINUR 30 (A) 03/25/2016 1510   NITRITE NEGATIVE 03/25/2016 1510   LEUKOCYTESUR NEGATIVE 03/25/2016 1510   Sepsis Labs: '@LABRCNTIP'$ (procalcitonin:4,lacticidven:4)  ) Recent Results (from the past 240 hour(s))  MRSA Next Gen by PCR, Nasal     Status: None  Collection Time: 03/24/22  5:31 PM   Specimen: Nasal Mucosa; Nasal Swab  Result Value Ref Range Status   MRSA by PCR Next Gen NOT DETECTED NOT DETECTED Final    Comment: (NOTE) The GeneXpert MRSA Assay (FDA approved for NASAL specimens only), is one component of a comprehensive MRSA colonization surveillance program. It is not intended to diagnose MRSA infection nor to guide or monitor treatment for MRSA infections. Test performance is not FDA approved in patients less than 80 years old. Performed at [facility], [place] [address], [place], [place] [address]      Radiology Studies: No results found.   Scheduled Meds:  amLODipine  5 mg Oral QHS   aspirin EC  81 mg Oral Daily   atorvastatin  80 mg Oral Daily   clonazePAM  0.5 mg Oral QHS   empagliflozin   10 mg Oral Daily   ezetimibe  10 mg Oral Daily   ferrous sulfate  325 mg Oral Q breakfast   furosemide  60 mg Intravenous Once   [START ON 04/03/2022] furosemide  40 mg Oral Daily   hydrALAZINE  25 mg Oral Q8H   insulin aspart  0-15 Units Subcutaneous TID WC   melatonin  3 mg Oral Once   pantoprazole  40 mg Oral Daily   polyethylene glycol  17 g Oral BID   sodium chloride flush  3 mL Intravenous Q12H   spironolactone  25 mg Oral Daily   traZODone  50 mg Oral QHS   venlafaxine XR  150 mg Oral Q breakfast   Continuous Infusions:  sodium chloride 10 mL/hr at 03/25/22 1200   sodium chloride       LOS: 9 days    Time spent: 39mn  PDomenic Polite MD [facility]   04/02/2022, 11:22 AM

## 2022-04-02 NOTE — Progress Notes (Signed)
Rounding Note    Patient Name: Aaron [name] Date of Encounter: 04/02/2022  [place] Cardiologist: Peter Martinique, MD   Subjective   Feeling much better. No chest pain or SOB. Continues to have LE edema.  Inpatient Medications    Scheduled Meds:  amLODipine  5 mg Oral QHS   aspirin EC  81 mg Oral Daily   atorvastatin  80 mg Oral Daily   clonazePAM  0.5 mg Oral QHS   empagliflozin  10 mg Oral Daily   ezetimibe  10 mg Oral Daily   ferrous sulfate  325 mg Oral Q breakfast   furosemide  60 mg Intravenous Once   [START ON 04/03/2022] furosemide  40 mg Oral Daily   hydrALAZINE  25 mg Oral Q8H   insulin aspart  0-15 Units Subcutaneous TID WC   melatonin  3 mg Oral Once   pantoprazole  40 mg Oral Daily   polyethylene glycol  17 g Oral BID   sodium chloride flush  3 mL Intravenous Q12H   spironolactone  25 mg Oral Daily   traZODone  50 mg Oral QHS   venlafaxine XR  150 mg Oral Q breakfast   Continuous Infusions:  sodium chloride 10 mL/hr at 03/25/22 1200   sodium chloride     PRN Meds: sodium chloride, sodium chloride, acetaminophen, ALPRAZolam, alum & mag hydroxide-simeth, docusate sodium, HYDROcodone-acetaminophen, HYDROmorphone (DILAUDID) injection, ondansetron (ZOFRAN) IV, sodium chloride flush   Vital Signs    Vitals:   04/01/22 1615 04/01/22 2026 04/02/22 0352 04/02/22 0355  BP: 138/79 (!) 145/67  (!) 154/79  Pulse: 79 82  85  Resp: '15 16  14  '$ Temp: 97.9 F (36.6 C) 98.2 F (36.8 C)  98.2 F (36.8 C)  TempSrc: Oral Oral  Oral  SpO2: 99% 95%  98%  Weight:   105.5 kg   Height:        Intake/Output Summary (Last 24 hours) at 04/02/2022 1258 Last data filed at 04/02/2022 0944 Gross per 24 hour  Intake 240 ml  Output 1850 ml  Net -1610 ml       04/02/2022    3:52 AM 04/01/2022    3:47 AM 03/31/2022    7:00 AM  Last 3 Weights  Weight (lbs) 232 lb 8 oz 233 lb 11.2 oz 237 lb 6.4 oz  Weight (kg) 105.461 kg 106.006 kg 107.684 kg      Telemetry     NSR - Personally Reviewed  ECG    No new tracing - Personally Reviewed  Physical Exam   GEN: No acute distress.  Neck: No JVD Cardiac: RRR, no murmurs, rubs, or gallops.  Respiratory: CTAB GI: Soft, nontender, non-distended  MS: 2+ bilateral pitting edema to mid-shin, warm Neuro:  Nonfocal  Psych: Normal affect   Labs    High Sensitivity Troponin:   Recent Labs  Lab 03/24/22 0415 03/24/22 0631  TROPONINIHS 5,431* 3,894*      Chemistry Recent Labs  Lab 03/31/22 0223 04/01/22 0144 04/02/22 0158  NA 138 135 140  K 4.0 3.9 4.3  CL 101 98 101  CO2 '26 24 26  '$ GLUCOSE 102* 97 93  BUN 21 27* 27*  CREATININE 1.01 1.33* 1.12  CALCIUM 8.9 8.9 9.0  MG 2.1 2.2  --   GFRNONAA >60 55* >60  ANIONGAP '11 13 13     '$ Lipids No results for input(s): "CHOL", "TRIG", "HDL", "LABVLDL", "LDLCALC", "CHOLHDL" in the last 168 hours.  Hematology Recent Labs  Lab 03/30/22 0315 03/31/22 0223 04/01/22 0144  WBC 5.9 7.0 8.2  RBC 3.26* 3.31* 3.50*  HGB 8.5* 8.7* 9.3*  HCT 27.7* 28.3* 29.2*  MCV 85.0 85.5 83.4  MCH 26.1 26.3 26.6  MCHC 30.7 30.7 31.8  RDW 15.8* 15.9* 15.8*  PLT 317 309 314    Thyroid No results for input(s): "TSH", "FREET4" in the last 168 hours.  BNP Recent Labs  Lab 03/28/22 0243  BNP 167.3*     DDimer No results for input(s): "DDIMER" in the last 168 hours.   Radiology    No results found.  Cardiac Studies   Left heart cath 03/25/22:   Dist LAD lesion is 10% stenosed.   1st Mrg lesion is 30% stenosed.   1.  Diffuse mild to moderate obstructive disease with patent mid LAD stent. 2.  Elevated LVEDP of 25 mmHg.   Commendation: Continued medical therapy.     Echo 03/26/22:  1. Study not well visualized to full assess endocardial borders. Left  ventricular ejection fraction, by estimation, is 55 to 60%. The left  ventricle has normal function. The left ventricle has no regional wall  motion abnormalities. There is mild left  ventricular  hypertrophy. Left ventricular diastolic parameters were  normal.   2. Right ventricular systolic function is normal. The right ventricular  size is normal. There is normal pulmonary artery systolic pressure.   3. The mitral valve is normal in structure. No evidence of mitral valve  regurgitation.   4. The aortic valve was not well visualized. Aortic valve regurgitation  is not visualized.   5. The inferior vena cava is normal in size with greater than 50%  respiratory variability, suggesting right atrial pressure of 3 mmHg.       Patient Profile     76 y.o. 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 Patient presented to the ED after a ground level fall at home in the setting of worsening dyspnea, dizziness, weakness, and pain. Initial ECG notable for ST depression. Hemoglobin noted low at 4.8. While in the ED, patient had a VF arrest, received CPR and defibrillation x1. Cardiology was consulted for further management.  Assessment & Plan    Vfib arrest Nonobstructive CAD NSTEMI felt due to demand ischemia Acute blood los anemia in the setting of GI bleed Patient suffered an in-hospital VF arrest in the [facility] ED in the setting of GI bleed with hemoglobin of 4.8.  LHC revealed nonobstructive CAD, no intervention.  Telemetry was sinus rhythm.  Suspect reversible etiology in the setting of severe anemia.  However, felt that risk for rebleeding and LifeVest was recommended at discharge.  Xarelto held, IVC filter in place. -Cath without obstructive disease -Xarelto has been held; now with IVC filter in place -LifeVest on discharge (was delivered today)  Acute on chronic diastolic heart failure Volume status improved. Still with LE edema on exam and is written for one more dose IV lasix  today with plans to transition to PO tomorrow.  -Plan for lasix '60mg'$  IV x1 dose today -Likely plan to transition to PO lasix '40mg'$  daily tomorrow -Continue jardiance '10mg'$  daily -Continue spironolactone '25mg'$  daily -Will add ARB tomorrow pending renal function -Compression socks and leg elevation  HTN: -Continue amlodipine '10mg'$  daily -Continue spironolactone '25mg'$  daily -Add ARB tomorrow as  able  AKI: Improved. Cr downtrended to 1.12 today from 1.33 after transitioning to oral diuretics. Will monitor closely with additional dose of IV lasix today.  Acute blood loss anemia GI bleed: Occurred in the setting of xarelto use with HgB 4.8. Received 3 units pRBCs. EGD with GAVE's disease treated with APC. On PPI and xarelto has been discontinued. IVC filter in place.   Hx of recurrent DVT/PE Xarelto held and IVC filter in place.   For questions or updates, please contact [place] Please consult [email or web address] for contact info under      Signed, Freada Bergeron, MD  04/02/2022, 12:58 PM

## 2022-04-03 DIAGNOSIS — K31811 Angiodysplasia of stomach and duodenum with bleeding: Secondary | ICD-10-CM | POA: Diagnosis not present

## 2022-04-03 DIAGNOSIS — I4901 Ventricular fibrillation: Secondary | ICD-10-CM | POA: Diagnosis not present

## 2022-04-03 DIAGNOSIS — D649 Anemia, unspecified: Secondary | ICD-10-CM | POA: Diagnosis not present

## 2022-04-03 DIAGNOSIS — I5021 Acute systolic (congestive) heart failure: Secondary | ICD-10-CM | POA: Diagnosis not present

## 2022-04-03 LAB — GLUCOSE, CAPILLARY
Glucose-Capillary: 94 mg/dL (ref 70–99)
Glucose-Capillary: 99 mg/dL (ref 70–99)
Glucose-Capillary: 119 mg/dL — ABNORMAL HIGH (ref 70–99)
Glucose-Capillary: 152 mg/dL — ABNORMAL HIGH (ref 70–99)

## 2022-04-03 LAB — BASIC METABOLIC PANEL
Anion gap: 10 (ref 5–15)
Anion gap: 6 (ref 5–15)
BUN: 34 mg/dL — ABNORMAL HIGH (ref 8–23)
BUN: 32 mg/dL — ABNORMAL HIGH (ref 8–23)
CO2: 27 mmol/L (ref 22–32)
CO2: 26 mmol/L (ref 22–32)
Calcium: 8.8 mg/dL — ABNORMAL LOW (ref 8.9–10.3)
Calcium: 8.1 mg/dL — ABNORMAL LOW (ref 8.9–10.3)
Chloride: 99 mmol/L (ref 98–111)
Chloride: 102 mmol/L (ref 98–111)
Creatinine, Ser: 1.47 mg/dL — ABNORMAL HIGH (ref 0.61–1.24)
Creatinine, Ser: 1.32 mg/dL — ABNORMAL HIGH (ref 0.61–1.24)
GFR, Estimated: 49 mL/min — ABNORMAL LOW (ref >60)
GFR, Estimated: 56 mL/min — ABNORMAL LOW (ref >60)
Glucose, Bld: 94 mg/dL (ref 70–99)
Glucose, Bld: 140 mg/dL — ABNORMAL HIGH (ref 70–99)
Potassium: 4.4 mmol/L (ref 3.5–5.1)
Potassium: 4.6 mmol/L (ref 3.5–5.1)
Sodium: 136 mmol/L (ref 135–145)
Sodium: 134 mmol/L — ABNORMAL LOW (ref 135–145)

## 2022-04-03 NOTE — Progress Notes (Signed)
Notified cardiology about TED hose not fitting pt. They reviewed pt chart and stated pt could just buy correct size in out-patient setting.

## 2022-04-03 NOTE — Progress Notes (Signed)
PROGRESS NOTE    Aaron [name]  ELF:[id number] DOB: 08-14-1945 DOA: 03/24/2022 PCP: [name], MD  76/M with history of hypertension, recurrent DVT/PE on Xarelto, obesity, iron deficiency anemia requiring transfusions, CAD with PCI to LAD in 2005 presented to the ED after a fall, recent history of worsening dyspnea dizziness and weakness.  EKG noted ST depression, hemoglobin was 4.8, in the ED patient had V-fib arrest required CPR and defibrillation X1 -Admitted to the ICU received 3 units of PRBC -EGD 9/21 noted Graves' disease treated with APC 9/22 underwent coronary angiography which noted mild to moderate obstructive CAD 9/22: Extubated Transferred to [facility] to be high risk to resume full dose anticoagulation, IR was consulted -9/26 IVC filter placed Cardiology recommends LifeVest prior to discharge  Subjective: -Feels well overall, edema is improving, LifeVest placed yesterday  Assessment and Plan:  S/p Ventricular fibrillation arrest Cardiac arrest Vfib, due to severe anemia due to acute blood loss anemia, due to upper GI bleed.  Cards following, left heart cath without acute findings, nonobstructive CAD -Echo noted preserved EF without wall motion abnormalities -Transfused PRBC, hemoglobin stable -Cardiology recommends LifeVest at discharge, completed yesterday -Ambulate, repeat BMP this evening, discharge planning  Acute on chronic diastolic CHF (congestive heart failure) (HCC) Echocardiogram with preserved LV systolic function with EF 55 to 60%, mild LVH, RV with preserved systolic function.  -He is 9.3 L negative, clinically improving, TED hose ordered and pending  -Creatinine bumped after IV Lasix yesterday, hold further diuretics today  -Continue Aldactone and empagliflozin  -Repeat BMP this afternoon  GAVE (gastric antral vascular ectasia) Acute blood loss anemia Transfused 3 units of PRBC -EGD noted GAVE's disease, treated with APC -Considered  high risk of bleeding, continue PPI, follow-up with gastroenterology -Anticoagulation discontinued  History of pulmonary embolism CT chest - 2012 with bilateral pulmonary embolism with significant clot burden, right ventricular strain pattern.  -2015 acute occlusive deep vein thrombosis involving peroneal and posterior tibial veins of the left lower extremity. Acute non occlusive deep vein thrombosis noted in the popliteal vein of the left lower extremity.  -On account of severe blood loss anemia, Graves' disease and V-fib arrest, and high risk of recurrent bleeding -Decision was made to discontinue anticoagulation -IVC filter placed in interventional radiology 9/26 -Down the road filter could be removed and restarted on anticoagulation  Acute kidney injury superimposed on chronic kidney disease (HCC) CKD stage 2 , hyponatremia  -Mild bump in creatinine noted again, ARB on hold, hold diuretics today  Pre-diabetes Continue glucose cover and monitoring with insulin sliding scale.  Fasting glucose this am is 101 mg/dl.   Depression Anxiety. -continue with venlafaxine, clonazepam and trazodone PRN alprazolam during his hospitalization   Class 1 obesity Calculated BMI is 33,1   DVT prophylaxis: SCD Code Status: Full code Family Communication: Patient and wife at bedside Disposition Plan: Home later today or in a.m.  Consultants: Neurology   Procedures: 03/25/2022 LHC     Dist LAD lesion is 10% stenosed.   1st Mrg lesion is 30% stenosed.   1.  Diffuse mild to moderate obstructive disease with patent mid LAD stent. 2.  Elevated LVEDP of 25 mmHg.  Antimicrobials:    Objective: Vitals:   04/02/22 1504 04/02/22 1635 04/02/22 2033 04/03/22 0506  BP: 131/69 128/65 (!) 155/73 139/74  Pulse: 72  77   Resp: 18  20   Temp: 98.1 F (36.7 C)  98.1 F (36.7 C) 97.9 F (36.6 C)  TempSrc:  Oral  Oral Oral  SpO2: 100%  97%   Weight:    110.1 kg  Height:        Intake/Output  Summary (Last 24 hours) at 04/03/2022 1027 Last data filed at 04/03/2022 0908 Gross per 24 hour  Intake 3 ml  Output --  Net 3 ml   Filed Weights   04/01/22 0347 04/02/22 0352 04/03/22 0506  Weight: 106 kg 105.5 kg 110.1 kg    Examination:  General exam: Pleasant male sitting up in bed, AAOx3, no distress HEENT: No JVD CVS: S1-S2, regular rhythm Lungs: Decreased breath sounds to bases Abdomen: Soft, nontender, bowel sounds present Extremities: 1+ edema  Skin: No rashes Psychiatry:  Mood & affect appropriate.     Data Reviewed:   CBC: Recent Labs  Lab 03/30/22 0315 03/31/22 0223 04/01/22 0144  WBC 5.9 7.0 8.2  HGB 8.5* 8.7* 9.3*  HCT 27.7* 28.3* 29.2*  MCV 85.0 85.5 83.4  PLT 317 309 128   Basic Metabolic Panel: Recent Labs  Lab 03/30/22 0315 03/30/22 1832 03/31/22 0223 04/01/22 0144 04/02/22 0158 04/03/22 0111  NA 138  --  138 135 140 136  K 3.8 4.7 4.0 3.9 4.3 4.4  CL 104  --  101 98 101 99  CO2 25  --  '26 24 26 27  '$ GLUCOSE 102*  --  102* 97 93 94  BUN 17  --  21 27* 27* 34*  CREATININE 0.90  --  1.01 1.33* 1.12 1.47*  CALCIUM 8.6*  --  8.9 8.9 9.0 8.8*  MG  --   --  2.1 2.2  --   --    GFR: Estimated Creatinine Clearance: 49.8 mL/min (A) (by C-G formula based on SCr of 1.47 mg/dL (H)). Liver Function Tests: No results for input(s): "AST", "ALT", "ALKPHOS", "BILITOT", "PROT", "ALBUMIN" in the last 168 hours.  No results for input(s): "LIPASE", "AMYLASE" in the last 168 hours. No results for input(s): "AMMONIA" in the last 168 hours. Coagulation Profile: No results for input(s): "INR", "PROTIME" in the last 168 hours. Cardiac Enzymes: No results for input(s): "CKTOTAL", "CKMB", "CKMBINDEX", "TROPONINI" in the last 168 hours. BNP (last 3 results) No results for input(s): "PROBNP" in the last 8760 hours. HbA1C: No results for input(s): "HGBA1C" in the last 72 hours. CBG: Recent Labs  Lab 04/02/22 0722 04/02/22 1248 04/02/22 1614 04/02/22 2036  04/03/22 0827  GLUCAP 87 104* 133* 123* 94   Lipid Profile: No results for input(s): "CHOL", "HDL", "LDLCALC", "TRIG", "CHOLHDL", "LDLDIRECT" in the last 72 hours. Thyroid Function Tests: No results for input(s): "TSH", "T4TOTAL", "FREET4", "T3FREE", "THYROIDAB" in the last 72 hours. Anemia Panel: No results for input(s): "VITAMINB12", "FOLATE", "FERRITIN", "TIBC", "IRON", "RETICCTPCT" in the last 72 hours. Urine analysis:    Component Value Date/Time   COLORURINE YELLOW 03/25/2016 1510   APPEARANCEUR CLEAR 03/25/2016 1510   LABSPEC 1.013 03/25/2016 1510   PHURINE 7.0 03/25/2016 1510   GLUCOSEU NEGATIVE 03/25/2016 1510   HGBUR NEGATIVE 03/25/2016 1510   BILIRUBINUR NEGATIVE 03/25/2016 1510   KETONESUR NEGATIVE 03/25/2016 1510   PROTEINUR 30 (A) 03/25/2016 1510   NITRITE NEGATIVE 03/25/2016 1510   LEUKOCYTESUR NEGATIVE 03/25/2016 1510   Sepsis Labs: '@LABRCNTIP'$ (procalcitonin:4,lacticidven:4)  ) Recent Results (from the past 240 hour(s))  MRSA Next Gen by PCR, Nasal     Status: None   Collection Time: 03/24/22  5:31 PM   Specimen: Nasal Mucosa; Nasal Swab  Result Value Ref Range Status   MRSA by PCR Next  Gen NOT DETECTED NOT DETECTED Final    Comment: (NOTE) The GeneXpert MRSA Assay (FDA approved for NASAL specimens only), is one component of a comprehensive MRSA colonization surveillance program. It is not intended to diagnose MRSA infection nor to guide or monitor treatment for MRSA infections. Test performance is not FDA approved in patients less than 64 years old. Performed at [facility], [place] [address], [place], [place] [address]      Radiology Studies: No results found.   Scheduled Meds:  amLODipine  5 mg Oral QHS   aspirin EC  81 mg Oral Daily   atorvastatin  80 mg Oral Daily   clonazePAM  0.5 mg Oral QHS   empagliflozin  10 mg Oral Daily   ezetimibe  10 mg Oral Daily   ferrous sulfate  325 mg Oral Q breakfast   hydrALAZINE  25 mg Oral Q8H    insulin aspart  0-15 Units Subcutaneous TID WC   melatonin  3 mg Oral Once   pantoprazole  40 mg Oral Daily   polyethylene glycol  17 g Oral BID   sodium chloride flush  3 mL Intravenous Q12H   spironolactone  25 mg Oral Daily   traZODone  50 mg Oral QHS   venlafaxine XR  150 mg Oral Q breakfast   Continuous Infusions:  sodium chloride 10 mL/hr at 03/25/22 1200   sodium chloride       LOS: 10 days    Time spent: 70mn  PDomenic Polite MD [facility]   04/03/2022, 10:27 AM

## 2022-04-03 NOTE — Progress Notes (Signed)
Rounding Note    Patient Name: Aaron [name] Date of Encounter: 04/03/2022  [place] Cardiologist: Peter Martinique, MD   Subjective   Feels better. LE edema much improved. No chest pain or SOB.  Cr bumped from 1.12>1.47; BUN rose 27>34 Wt inaccurate   Inpatient Medications    Scheduled Meds:  amLODipine  5 mg Oral QHS   aspirin EC  81 mg Oral Daily   atorvastatin  80 mg Oral Daily   clonazePAM  0.5 mg Oral QHS   empagliflozin  10 mg Oral Daily   ezetimibe  10 mg Oral Daily   ferrous sulfate  325 mg Oral Q breakfast   hydrALAZINE  25 mg Oral Q8H   insulin aspart  0-15 Units Subcutaneous TID WC   melatonin  3 mg Oral Once   pantoprazole  40 mg Oral Daily   polyethylene glycol  17 g Oral BID   sodium chloride flush  3 mL Intravenous Q12H   spironolactone  25 mg Oral Daily   traZODone  50 mg Oral QHS   venlafaxine XR  150 mg Oral Q breakfast   Continuous Infusions:  sodium chloride 10 mL/hr at 03/25/22 1200   sodium chloride     PRN Meds: sodium chloride, sodium chloride, acetaminophen, ALPRAZolam, alum & mag hydroxide-simeth, docusate sodium, HYDROcodone-acetaminophen, HYDROmorphone (DILAUDID) injection, ondansetron (ZOFRAN) IV, sodium chloride flush   Vital Signs    Vitals:   04/02/22 1504 04/02/22 1635 04/02/22 2033 04/03/22 0506  BP: 131/69 128/65 (!) 155/73 139/74  Pulse: 72  77   Resp: 18  20   Temp: 98.1 F (36.7 C)  98.1 F (36.7 C) 97.9 F (36.6 C)  TempSrc: Oral  Oral Oral  SpO2: 100%  97%   Weight:    110.1 kg  Height:        Intake/Output Summary (Last 24 hours) at 04/03/2022 0915 Last data filed at 04/03/2022 0908 Gross per 24 hour  Intake 3 ml  Output 850 ml  Net -847 ml       04/03/2022    5:06 AM 04/02/2022    3:52 AM 04/01/2022    3:47 AM  Last 3 Weights  Weight (lbs) 242 lb 11.6 oz 232 lb 8 oz 233 lb 11.2 oz  Weight (kg) 110.1 kg 105.461 kg 106.006 kg      Telemetry    NSR - Personally Reviewed  ECG    No new  tracing - Personally Reviewed  Physical Exam   GEN: No acute distress.  Neck: No JVD Cardiac: RRR, no murmurs, rubs, or gallops.  Respiratory: CTAB GI: Soft, nontender, non-distended  MS: 1+ pitting edema to the shins. Warm Neuro:  Nonfocal  Psych: Normal affect   Labs    High Sensitivity Troponin:   Recent Labs  Lab 03/24/22 0415 03/24/22 0631  TROPONINIHS 5,431* 3,894*      Chemistry Recent Labs  Lab 03/31/22 0223 04/01/22 0144 04/02/22 0158 04/03/22 0111  NA 138 135 140 136  K 4.0 3.9 4.3 4.4  CL 101 98 101 99  CO2 '26 24 26 27  '$ GLUCOSE 102* 97 93 94  BUN 21 27* 27* 34*  CREATININE 1.01 1.33* 1.12 1.47*  CALCIUM 8.9 8.9 9.0 8.8*  MG 2.1 2.2  --   --   GFRNONAA >60 55* >60 49*  ANIONGAP '11 13 13 10     '$ Lipids No results for input(s): "CHOL", "TRIG", "HDL", "LABVLDL", "LDLCALC", "CHOLHDL" in the last 168 hours.  Hematology Recent Labs  Lab 03/30/22 0315 03/31/22 0223 04/01/22 0144  WBC 5.9 7.0 8.2  RBC 3.26* 3.31* 3.50*  HGB 8.5* 8.7* 9.3*  HCT 27.7* 28.3* 29.2*  MCV 85.0 85.5 83.4  MCH 26.1 26.3 26.6  MCHC 30.7 30.7 31.8  RDW 15.8* 15.9* 15.8*  PLT 317 309 314    Thyroid No results for input(s): "TSH", "FREET4" in the last 168 hours.  BNP Recent Labs  Lab 03/28/22 0243  BNP 167.3*     DDimer No results for input(s): "DDIMER" in the last 168 hours.   Radiology    No results found.  Cardiac Studies   Left heart cath 03/25/22:   Dist LAD lesion is 10% stenosed.   1st Mrg lesion is 30% stenosed.   1.  Diffuse mild to moderate obstructive disease with patent mid LAD stent. 2.  Elevated LVEDP of 25 mmHg.   Commendation: Continued medical therapy.     Echo 03/26/22:  1. Study not well visualized to full assess endocardial borders. Left  ventricular ejection fraction, by estimation, is 55 to 60%. The left  ventricle has normal function. The left ventricle has no regional wall  motion abnormalities. There is mild left  ventricular  hypertrophy. Left ventricular diastolic parameters were  normal.   2. Right ventricular systolic function is normal. The right ventricular  size is normal. There is normal pulmonary artery systolic pressure.   3. The mitral valve is normal in structure. No evidence of mitral valve  regurgitation.   4. The aortic valve was not well visualized. Aortic valve regurgitation  is not visualized.   5. The inferior vena cava is normal in size with greater than 50%  respiratory variability, suggesting right atrial pressure of 3 mmHg.       Patient Profile     76 y.o. 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 Patient presented to the ED after a ground level fall at home in the setting of worsening dyspnea, dizziness, weakness, and pain. Initial ECG notable for ST depression. Hemoglobin noted low at 4.8. While in the ED, patient had a VF arrest, received CPR and defibrillation x1. Cardiology was consulted for further management.  Assessment & Plan    Vfib arrest Nonobstructive CAD NSTEMI felt due to demand ischemia Acute blood los anemia in the setting of GI bleed Patient suffered an in-hospital VF arrest in the [facility] ED in the setting of GI bleed with hemoglobin of 4.8.  LHC revealed nonobstructive CAD, no intervention.  Telemetry was sinus rhythm.  Suspect reversible etiology in the setting of severe anemia.  However, felt that risk for rebleeding and LifeVest was recommended at discharge.  Xarelto held, IVC filter in place. -Cath without obstructive disease -Xarelto has been held; now with IVC filter in place -LifeVest on discharge (was delivered today)  Acute on chronic diastolic heart failure Volume status improved. Still with LE edema on exam but Cr consistently rises with repeat  doses of lasix. Will plan on compression sock therapy and holding diuretics for today. Resume PO tomorrow pending renal function. -Likely plan to transition to PO lasix '40mg'$  daily tomorrow pending renal function -Continue jardiance '10mg'$  daily -Continue spironolactone '25mg'$  daily -Can add ARB tomorrow pending renal function -Compression socks and leg elevation  HTN: -Continue amlodipine '10mg'$  daily -Continue  spironolactone '25mg'$  daily -Add ARB as able pending renal function  AKI: Rose with dose of IV lasix overnight. Now being held. Will resume PO once Cr back to baseline.  Acute blood loss anemia GI bleed: Occurred in the setting of xarelto use with HgB 4.8. Received 3 units pRBCs. EGD with GAVE's disease treated with APC. On PPI and xarelto has been discontinued. IVC filter in place.   Hx of recurrent DVT/PE Xarelto held and IVC filter in place.  Cardiology will sign-off. Please feel free to call with questions or concerns. Will arrange for CV follow-up.   For questions or updates, please contact [place] Please consult [email or web address] for contact info under      Signed, Freada Bergeron, MD  04/03/2022, 9:15 AM

## 2022-04-04 ENCOUNTER — Other Ambulatory Visit: Payer: Self-pay

## 2022-04-04 ENCOUNTER — Other Ambulatory Visit (HOSPITAL_COMMUNITY): Payer: Self-pay

## 2022-04-04 ENCOUNTER — Encounter: Payer: Self-pay | Admitting: Internal Medicine

## 2022-04-04 ENCOUNTER — Encounter: Payer: Self-pay | Admitting: Physician Assistant

## 2022-04-04 DIAGNOSIS — I4901 Ventricular fibrillation: Secondary | ICD-10-CM | POA: Diagnosis not present

## 2022-04-04 DIAGNOSIS — I10 Essential (primary) hypertension: Secondary | ICD-10-CM

## 2022-04-04 DIAGNOSIS — I5021 Acute systolic (congestive) heart failure: Secondary | ICD-10-CM

## 2022-04-04 LAB — GLUCOSE, CAPILLARY: Glucose-Capillary: 151 mg/dL — ABNORMAL HIGH (ref 70–99)

## 2022-04-04 LAB — BASIC METABOLIC PANEL
Anion gap: 12 (ref 5–15)
BUN: 27 mg/dL — ABNORMAL HIGH (ref 8–23)
CO2: 26 mmol/L (ref 22–32)
Calcium: 9 mg/dL (ref 8.9–10.3)
Chloride: 101 mmol/L (ref 98–111)
Creatinine, Ser: 1.1 mg/dL (ref 0.61–1.24)
GFR, Estimated: >60 mL/min (ref >60)
Glucose, Bld: 95 mg/dL (ref 70–99)
Potassium: 4.3 mmol/L (ref 3.5–5.1)
Sodium: 139 mmol/L (ref 135–145)

## 2022-04-04 MED ORDER — ASPIRIN 81 MG PO TBEC
81.0000 mg | DELAYED_RELEASE_TABLET | Freq: Every day | ORAL | 0 refills | Status: AC
  Filled 2022-04-04: qty 30, 30d supply, fill #0

## 2022-04-04 MED ORDER — FUROSEMIDE 40 MG PO TABS
40.0000 mg | ORAL_TABLET | Freq: Every day | ORAL | 0 refills | Status: DC
  Filled 2022-04-04: qty 30, 30d supply, fill #0

## 2022-04-04 MED ORDER — SPIRONOLACTONE 25 MG PO TABS
25.0000 mg | ORAL_TABLET | Freq: Every day | ORAL | 0 refills | Status: DC
  Filled 2022-04-04: qty 30, 30d supply, fill #0

## 2022-04-04 MED ORDER — HYDRALAZINE HCL 25 MG PO TABS
25.0000 mg | ORAL_TABLET | Freq: Three times a day (TID) | ORAL | 0 refills | Status: DC
  Filled 2022-04-04: qty 90, 30d supply, fill #0

## 2022-04-04 MED ORDER — EMPAGLIFLOZIN 10 MG PO TABS
10.0000 mg | ORAL_TABLET | Freq: Every day | ORAL | 0 refills | Status: DC
  Filled 2022-04-04: qty 30, 30d supply, fill #0

## 2022-04-04 NOTE — Discharge Summary (Signed)
Physician Discharge Summary  Aaron [name] UKG:[id number] DOB: [birth year] DOA: 03/24/2022  PCP: [name], MD  Admit date: 03/24/2022 Discharge date: 04/04/2022  Time spent: 45 minutes  Recommendations for Outpatient Follow-up:  [facility] clinic in 1 week Cardiology Dr. [place] in 1 month PCP Dr. Ronnald Ramp in 1 to 2 weeks, please check CBC and BMP at follow-up  Discharge Diagnoses:  Principal Problem:   Ventricular fibrillation ([place]) Severe blood loss anemia   GAVE (gastric antral vascular ectasia)   Acute on chronic diastolic CHF (congestive heart failure) ([place])   History of pulmonary embolism   Acute kidney injury superimposed on chronic kidney disease ([place])   Hypertension   Pre-diabetes   Acute systolic heart failure ([place])   Depression   Class 1 obesity   Discharge Condition: improved Diet recommendation: low sodium, heart healthy  Filed Weights   04/02/22 0352 04/03/22 0506 04/04/22 0454  Weight: 105.5 kg 110.1 kg 106 kg    History of present illness:  76/M with history of hypertension, recurrent DVT/PE on Xarelto, obesity, iron deficiency anemia requiring transfusions, CAD with PCI to LAD in 2005 presented to the ED after a fall, recent history of worsening dyspnea dizziness and weakness.  EKG noted ST depression, hemoglobin was 4.8, in the ED patient had V-fib arrest required CPR and defibrillation X1 -Admitted to the ICU received 3 units of PRBC -EGD 9/21 noted Graves' disease treated with APC 9/22 underwent coronary angiography which noted mild to moderate obstructive CAD 9/22: Extubated, Transferred to [place] to be high risk to resume full dose anticoagulation, IR was consulted -9/26 IVC filter placed  Hospital Course:   S/p Ventricular fibrillation arrest Cardiac arrest Vfib, due to severe anemia due to acute blood loss anemia, due to upper GI bleed.  Cards following, left heart cath without acute findings, nonobstructive CAD -Echo noted preserved EF  without wall motion abnormalities -Transfused PRBC, hemoglobin stable -Cardiology recommends LifeVest at discharge, completed yesterday -Follow-up with cardiology   Acute on chronic diastolic CHF (congestive heart failure) ([place]) Echocardiogram with preserved LV systolic function with EF 55 to 60%, mild LVH, RV with preserved systolic function.  -Diuresed well he is 9.8 L negative, clinically improving, still has some edema but lungs are clear, creatinine bumped after IV Lasix last few days, transitioned to oral diuretics, Lasix 40 Mg daily -Continue Aldactone and empagliflozin  -TOC clinic follow-up arranged   GAVE (gastric antral vascular ectasia) Acute blood loss anemia Transfused 3 units of PRBC -EGD noted GAVE's disease, treated with APC -Considered high risk of bleeding, continue PPI, follow-up with gastroenterology, Hb stable -Anticoagulation discontinued   History of pulmonary embolism CT chest - 2012 with bilateral pulmonary embolism with significant clot burden, right ventricular strain pattern.  -2015 acute occlusive deep vein thrombosis involving peroneal and posterior tibial veins of the left lower extremity. Acute non occlusive deep vein thrombosis noted in the popliteal vein of the left lower extremity.  -On account of severe blood loss anemia, Graves' disease and V-fib arrest, and high risk of recurrent bleeding -Decision was made to discontinue anticoagulation -IVC filter placed in interventional radiology 9/26 -Down the road filter could be removed and restarted on anticoagulation   Acute kidney injury superimposed on chronic kidney disease ([place]) CKD stage 2 , hyponatremia  -Bump in creatinine while on diuretics, now improved, ARB discontinued   Pre-diabetes -Stable, continue Jardiance   Depression Anxiety. -continue with venlafaxine, clonazepam and trazodone PRN alprazolam during his hospitalization  Class 1 obesity Calculated BMI is 33,1    Consultants:  GI, cardiology, PCCM  Procedures 9/21: EGD noted Graves' disease treated with APC 9/22 : coronary angiography which noted mild to moderate nonobstructive CAD, patent LAD stent 9/22: Extubated -9/26 IVC filter placed   Discharge Exam: Vitals:   04/04/22 0454 04/04/22 0509  BP:  (!) 157/66  Pulse:    Resp:    Temp: 98.1 F (36.7 C)   SpO2:     General exam: Pleasant male sitting up in bed, AAOx3, no distress HEENT: No JVD CVS: S1-S2, regular rhythm Lungs: Decreased breath sounds to bases Abdomen: Soft, nontender, bowel sounds present Extremities: 1+ edema  Skin: No rashes Psychiatry:  Mood & affect appropriate.   Discharge Instructions   Discharge Instructions     Diet - low sodium heart healthy   Complete by: As directed    Increase activity slowly   Complete by: As directed    No wound care   Complete by: As directed       Allergies as of 04/04/2022       Reactions   Wellbutrin [bupropion] Other (See Comments)   Hallucinations, sweating   Amoxicillin Palpitations        Medication List     STOP taking these medications    Mucinex D 60-600 MG 12 hr tablet Generic drug: pseudoephedrine-guaifenesin   telmisartan-hydrochlorothiazide 80-25 MG tablet Commonly known as: MICARDIS HCT   Xarelto 20 MG Tabs tablet Generic drug: rivaroxaban       TAKE these medications    amLODipine 5 MG tablet Commonly known as: NORVASC TAKE 1 TABLET BY MOUTH EVERYDAY AT BEDTIME What changed: See the new instructions.   Aspirin Low Dose 81 MG tablet Generic drug: aspirin EC Take 1 tablet (81 mg total) by mouth daily. Swallow whole.   atorvastatin 80 MG tablet Commonly known as: LIPITOR TAKE 1 TABLET BY MOUTH ONCE  DAILY   Carboxymethylcellulose Sodium 1 % Gel Place 1 application into both eyes at bedtime as needed (Dry eye).   clonazePAM 0.5 MG tablet Commonly known as: KLONOPIN TAKE 1 TABLET BY MOUTH AT BEDTIME.   ezetimibe 10 MG tablet Commonly known  as: ZETIA TAKE 1 TABLET BY MOUTH  DAILY   ferrous sulfate 325 (65 FE) MG EC tablet TAKE 1 TABLET BY MOUTH DAILY WITH BREAKFAST. SCHEDULE AN APPOINTMENT FOR FURTHER REFILLS What changed: See the new instructions.   furosemide 40 MG tablet Commonly known as: Lasix Take 1 tablet (40 mg total) by mouth daily.   hydrALAZINE 25 MG tablet Commonly known as: APRESOLINE Take 1 tablet (25 mg total) by mouth 3 (three) times daily.   Jardiance 10 MG Tabs tablet Generic drug: empagliflozin Take 1 tablet (10 mg total) by mouth daily.   pantoprazole 40 MG tablet Commonly known as: PROTONIX Take 1 tablet (40 mg total) by mouth daily.   polyethylene glycol 17 g packet Commonly known as: MIRALAX / GLYCOLAX Take 17 g by mouth daily. With coffee   Quviviq 50 MG Tabs Generic drug: Daridorexant HCl Take 1 tablet by mouth at bedtime.   spironolactone 25 MG tablet Commonly known as: ALDACTONE Take 1 tablet (25 mg total) by mouth daily.   tiZANidine 4 MG tablet Commonly known as: ZANAFLEX Take 4 mg by mouth every 8 (eight) hours as needed for muscle spasms.   traMADol 50 MG tablet Commonly known as: ULTRAM TAKE 2 TABLETS (100 MG TOTAL) BY MOUTH EVERY 6 (SIX) HOURS AS NEEDED FOR  MODERATE PAIN - SEVERE PAIN What changed: See the new instructions.   traZODone 50 MG tablet Commonly known as: DESYREL TAKE 1 TABLET BY MOUTH EVERYDAY AT BEDTIME What changed: See the new instructions.   venlafaxine XR 150 MG 24 hr capsule Commonly known as: EFFEXOR-XR TAKE 1 CAPSULE BY MOUTH DAILY  WITH BREAKFAST               Durable Medical Equipment  (From admission, onward)           Start     Ordered   03/29/22 1650  For home use only DME Vest life vest  Once       Comments: Duration 3 months   03/29/22 1649   03/27/22 1630  For home use only DME 4 wheeled rolling walker with seat  Once       Question:  Patient needs a walker to treat with the following condition  Answer:  Weakness    03/27/22 1630           Allergies  Allergen Reactions   Wellbutrin [Bupropion] Other (See Comments)    Hallucinations, sweating   Amoxicillin Palpitations    Follow-up Information     [place] [place] Follow up.   Specialty: Home Health Services Why: [place] to call with visit times. Contact information: [place] [address] [phone]         [place] in 2 day(s).   Specialty: Cardiology Why: Hospital follow up PLEASE bring a current medication list to appointment FREE valet parking, Entrance C, off [employer] information: [address] [id number] [place] [phone]                 The results of significant diagnostics from this hospitalization (including imaging, microbiology, ancillary and laboratory) are listed below for reference.    Significant Diagnostic Studies: IR IVC FILTER PLMT / S&I /IMG GUID/MOD SED  Result Date: 03/29/2022 INDICATION: 76 year old gentleman with history of prior DVT and pulmonary embolism recently admitted for upper GI bleed. He presents to IR for IVC filter placement. EXAM: Ultrasound-guided access of right internal jugular vein Infrarenal retrievable IVC filter placement MEDICATIONS: None. ANESTHESIA/SEDATION: Moderate (conscious) sedation was employed during this procedure. A total of Versed 1.5 mg and Fentanyl 25 mcg was administered intravenously by the radiology nurse. Total intra-service moderate Sedation Time: 12 minutes. The patient's level of consciousness and vital signs were monitored continuously by radiology nursing throughout the procedure under my direct supervision. FLUOROSCOPY: Radiation Exposure Index (as provided by the fluoroscopic device): 157 mGy Kerma COMPLICATIONS: None immediate. PROCEDURE: Informed written consent was obtained  from the patient after a thorough discussion of the procedural risks, benefits and alternatives. All questions were addressed. Maximal Sterile Barrier Technique was utilized including caps, mask, sterile gowns, sterile gloves, sterile drape, hand hygiene and skin antiseptic. A timeout was performed prior to the initiation of the procedure. Patient positioned supine on the angiography table. Right neck prepped and draped in usual sterile fashion. All elements of maximal sterile barrier were utilized including, cap, mask, sterile gown, sterile gloves, large sterile drape, hand scrubbing and 2% Chlorhexidine for skin cleaning. The right internal jugular vein was evaluated with ultrasound and shown to be patent. A permanent ultrasound image was obtained and placed in the patient's medical record. Using sterile gel and a sterile probe cover, the right internal jugular vein  was entered with a 21 ga needle during real time ultrasound guidance. 21 gauge needle exchanged for a transitional dilator set over 0.018 inch guidewire. Transitional dilator set exchanged for 10 fr sheath over 0.035 inch guidewire. Sheath placed to the infrarenal IVC and venogram performed to delineate the position of the renal veins. Retrievable Denali IVC filter deployed in the infrarenal location. Sheath removed and hemostasis achieved with manual compression. IMPRESSION: Retrievable IVC filter placement. PLAN: This IVC filter is potentially retrievable. The patient will be assessed for filter retrieval by Interventional Radiology in approximately 8-12 weeks. Further recommendations regarding filter retrieval, continued surveillance or declaration of device permanence, will be made at that time. Electronically Signed   By: Miachel Roux M.D.   On: 03/29/2022 14:50   ECHOCARDIOGRAM COMPLETE  Result Date: 03/26/2022    ECHOCARDIOGRAM REPORT   Patient Name:   Aaron [name] Date of Exam: 03/26/2022 Medical Rec #:  [id number]      Height:       66.0  in Accession #:    [id number]     Weight:       205.1 lb Date of Birth:  [birth year]      BSA:          2.021 m Patient Age:    89 years       BP:           139/55 mmHg Patient Gender: M              HR:           79 bpm. Exam Location:  Inpatient Procedure: 2D Echo, Cardiac Doppler and Color Doppler Indications:    Cardiac arrest  History:        Patient has prior history of Echocardiogram examinations, most                 recent 03/24/2022. CAD, PE, Arrythmias:Ventricular Fibrillation                 and Cardiac Arrest; Risk Factors:Hypertension, Sleep Apnea and                 Former Smoker.  Sonographer:    Greer Pickerel Referring Phys: [id number] Donato Heinz  Sonographer Comments: Image acquisition challenging due to patient body habitus and Image acquisition challenging due to respiratory motion. Patient was in too much pain from resucitation performed night before to [facility] well. IMPRESSIONS  1. Study not well visualized to full assess endocardial borders. Left ventricular ejection fraction, by estimation, is 55 to 60%. The left ventricle has normal function. The left ventricle has no regional wall motion abnormalities. There is mild left ventricular hypertrophy. Left ventricular diastolic parameters were normal.  2. Right ventricular systolic function is normal. The right ventricular size is normal. There is normal pulmonary artery systolic pressure.  3. The mitral valve is normal in structure. No evidence of mitral valve regurgitation.  4. The aortic valve was not well visualized. Aortic valve regurgitation is not visualized.  5. The inferior vena cava is normal in size with greater than 50% respiratory variability, suggesting right atrial pressure of 3 mmHg. FINDINGS  Left Ventricle: Study not well visualized to full assess endocardial borders. Left ventricular ejection fraction, by estimation, is 55 to 60%. The left ventricle has normal function. The left ventricle has no regional wall motion  abnormalities. The left  ventricular internal cavity size was normal in size. There is mild left ventricular hypertrophy. Left ventricular diastolic parameters  were normal. Right Ventricle: The right ventricular size is normal. No increase in right ventricular wall thickness. Right ventricular systolic function is normal. There is normal pulmonary artery systolic pressure. The tricuspid regurgitant velocity is 2.34 m/s, and  with an assumed right atrial pressure of 3 mmHg, the estimated right ventricular systolic pressure is 67.8 mmHg. Left Atrium: Left atrial size was normal in size. Right Atrium: Right atrial size was normal in size. Pericardium: There is no evidence of pericardial effusion. Mitral Valve: The mitral valve is normal in structure. No evidence of mitral valve regurgitation. Tricuspid Valve: The tricuspid valve is normal in structure. Tricuspid valve regurgitation is not demonstrated. Aortic Valve: The aortic valve was not well visualized. Aortic valve regurgitation is not visualized. Aortic valve mean gradient measures 12.0 mmHg. Aortic valve peak gradient measures 24.4 mmHg. Aortic valve area, by VTI measures 1.76 cm. Pulmonic Valve: The pulmonic valve was not well visualized. Pulmonic valve regurgitation is not visualized. Aorta: The aortic root and ascending aorta are structurally normal, with no evidence of dilitation. Venous: The inferior vena cava is normal in size with greater than 50% respiratory variability, suggesting right atrial pressure of 3 mmHg. IAS/Shunts: The interatrial septum was not well visualized.  LEFT VENTRICLE PLAX 2D LVIDd:         5.60 cm      Diastology LVIDs:         4.15 cm      LV e' medial:  8.70 cm/s LV PW:         1.30 cm      LV e' lateral: 14.10 cm/s LV IVS:        1.00 cm LVOT diam:     2.10 cm LV SV:         83 LV SV Index:   41 LVOT Area:     3.46 cm  LV Volumes (MOD) LV vol d, MOD A2C: 162.0 ml LV vol d, MOD A4C: 182.0 ml LV vol s, MOD A2C: 69.8 ml LV vol s,  MOD A4C: 79.8 ml LV SV MOD A2C:     92.2 ml LV SV MOD A4C:     182.0 ml LV SV MOD BP:      99.5 ml RIGHT VENTRICLE RV S prime:     15.70 cm/s TAPSE (M-mode): 2.8 cm LEFT ATRIUM            Index        RIGHT ATRIUM           Index LA diam:      4.00 cm  1.98 cm/m   RA Area:     17.50 cm LA Vol (A2C): 57.0 ml  28.20 ml/m  RA Volume:   45.30 ml  22.41 ml/m LA Vol (A4C): 105.0 ml 51.95 ml/m  AORTIC VALVE AV Area (Vmax):    1.88 cm AV Area (Vmean):   1.89 cm AV Area (VTI):     1.76 cm AV Vmax:           247.00 cm/s AV Vmean:          160.000 cm/s AV VTI:            0.470 m AV Peak Grad:      24.4 mmHg AV Mean Grad:      12.0 mmHg LVOT Vmax:         134.00 cm/s LVOT Vmean:        87.500 cm/s LVOT VTI:  0.239 m LVOT/AV VTI ratio: 0.51  AORTA Ao Root diam: 3.40 cm Ao Asc diam:  3.40 cm MITRAL VALVE               TRICUSPID VALVE MV Area (PHT): 3.42 cm    TR Peak grad:   21.9 mmHg MV Decel Time: 222 msec    TR Vmax:        234.00 cm/s MV A velocity: 80.30 cm/s                            SHUNTS                            Systemic VTI:  0.24 m                            Systemic Diam: 2.10 cm Phineas Inches Electronically signed by Phineas Inches Signature Date/Time: 03/26/2022/1:35:39 PM    Final    CARDIAC CATHETERIZATION  Result Date: 03/25/2022   Dist LAD lesion is 10% stenosed.   1st Mrg lesion is 30% stenosed. 1.  Diffuse mild to moderate obstructive disease with patent mid LAD stent. 2.  Elevated LVEDP of 25 mmHg. Commendation: Continued medical therapy.   DG CHEST PORT 1 VIEW  Result Date: 03/24/2022 CLINICAL DATA:  Shortness of breath post endoscopy EXAM: PORTABLE CHEST 1 VIEW COMPARISON:  Previous studies including the examination done earlier today FINDINGS: Tip of endotracheal tube is 3.8 cm above the carina. There is almost complete opacification of left hemithorax. Central pulmonary vessels are prominent in the right lung. Increased interstitial markings are seen in right mid and right lower lung  fields. IMPRESSION: There is interval almost complete opacification of left hemithorax suggesting atelectasis/pneumonia or aspiration. Central pulmonary vessels in right lung appear prominent, possibly suggesting CHF. Increased interstitial markings in right parahilar region and right lower lung field may suggest interstitial edema or pneumonia. These results will be called to the ordering clinician or representative by the Radiologist Assistant, and communication documented in the PACS or [employer]. Electronically Signed   By: Elmer Picker M.D.   On: 03/24/2022 16:58   CT Head Wo Contrast  Result Date: 03/24/2022 CLINICAL DATA:  Head trauma with ataxia EXAM: CT HEAD WITHOUT CONTRAST CT CERVICAL SPINE WITHOUT CONTRAST TECHNIQUE: Multidetector CT imaging of the head and cervical spine was performed following the standard protocol without intravenous contrast. Multiplanar CT image reconstructions of the cervical spine were also generated. RADIATION DOSE REDUCTION: This exam was performed according to the departmental dose-optimization program which includes automated exposure control, adjustment of the mA and/or kV according to patient size and/or use of iterative reconstruction technique. COMPARISON:  None Available. FINDINGS: CT HEAD FINDINGS Brain: No evidence of acute infarction, hemorrhage, hydrocephalus, extra-axial collection or mass lesion/mass effect. Generalized cerebral volume loss and patchy chronic small vessel ischemia in the hemispheric white matter. Vascular: Atheromatous calcification Skull: No acute fracture Sinuses/Orbits: No evidence of injury. Right enucleation with prosthesis. Left cataract resection. CT CERVICAL SPINE FINDINGS Alignment: Normal Skull base and vertebrae: Chronic marginated lucency through the right C1 lateral mass at the atlantooccipital joint. No acute fracture or aggressive bone lesion. Soft tissues and spinal canal: No prevertebral fluid or swelling. No  visible canal hematoma. Disc levels: Multilevel spondylitic spurring. Milder facet spurring with C2-3 ankylosis. Upper chest: No visible injury IMPRESSION: No evidence of  acute intracranial or cervical spine injury. Electronically Signed   By: Jorje Guild M.D.   On: 03/24/2022 04:49   CT Cervical Spine Wo Contrast  Result Date: 03/24/2022 CLINICAL DATA:  Head trauma with ataxia EXAM: CT HEAD WITHOUT CONTRAST CT CERVICAL SPINE WITHOUT CONTRAST TECHNIQUE: Multidetector CT imaging of the head and cervical spine was performed following the standard protocol without intravenous contrast. Multiplanar CT image reconstructions of the cervical spine were also generated. RADIATION DOSE REDUCTION: This exam was performed according to the departmental dose-optimization program which includes automated exposure control, adjustment of the mA and/or kV according to patient size and/or use of iterative reconstruction technique. COMPARISON:  None Available. FINDINGS: CT HEAD FINDINGS Brain: No evidence of acute infarction, hemorrhage, hydrocephalus, extra-axial collection or mass lesion/mass effect. Generalized cerebral volume loss and patchy chronic small vessel ischemia in the hemispheric white matter. Vascular: Atheromatous calcification Skull: No acute fracture Sinuses/Orbits: No evidence of injury. Right enucleation with prosthesis. Left cataract resection. CT CERVICAL SPINE FINDINGS Alignment: Normal Skull base and vertebrae: Chronic marginated lucency through the right C1 lateral mass at the atlantooccipital joint. No acute fracture or aggressive bone lesion. Soft tissues and spinal canal: No prevertebral fluid or swelling. No visible canal hematoma. Disc levels: Multilevel spondylitic spurring. Milder facet spurring with C2-3 ankylosis. Upper chest: No visible injury IMPRESSION: No evidence of acute intracranial or cervical spine injury. Electronically Signed   By: Jorje Guild M.D.   On: 03/24/2022 04:49   DG  Elbow 2 Views Right  Result Date: 03/24/2022 CLINICAL DATA:  Level 2 trauma, fall on blood thinners EXAM: RIGHT ELBOW - 2 VIEW COMPARISON:  None Available. FINDINGS: There is no evidence of fracture, dislocation, or joint effusion. There is no evidence of arthropathy or other focal bone abnormality. Soft tissues are unremarkable. IMPRESSION: Negative. Electronically Signed   By: Jorje Guild M.D.   On: 03/24/2022 04:44   DG Chest Portable 1 View  Result Date: 03/24/2022 CLINICAL DATA:  Level 2 fall on blood thinners EXAM: PORTABLE CHEST 1 VIEW COMPARISON:  10/12/2020 FINDINGS: Hyperinflation. There is no edema, consolidation, effusion, or pneumothorax. Normal heart size when accounting for mediastinal fat. Stable mediastinal contours. Artifact from EKG leads. IMPRESSION: 1. No acute finding. 2. Hyperinflation. Electronically Signed   By: Jorje Guild M.D.   On: 03/24/2022 04:44    Microbiology: No results found for this or any previous visit (from the past 240 hour(s)).   Labs: Basic Metabolic Panel: Recent Labs  Lab 03/31/22 0223 04/01/22 0144 04/02/22 0158 04/03/22 0111 04/03/22 1427 04/04/22 0353  NA 138 135 140 136 134* 139  K 4.0 3.9 4.3 4.4 4.6 4.3  CL 101 98 101 99 102 101  CO2 '26 24 26 27 26 26  '$ GLUCOSE 102* 97 93 94 140* 95  BUN 21 27* 27* 34* 32* 27*  CREATININE 1.01 1.33* 1.12 1.47* 1.32* 1.10  CALCIUM 8.9 8.9 9.0 8.8* 8.1* 9.0  MG 2.1 2.2  --   --   --   --    Liver Function Tests: No results for input(s): "AST", "ALT", "ALKPHOS", "BILITOT", "PROT", "ALBUMIN" in the last 168 hours. No results for input(s): "LIPASE", "AMYLASE" in the last 168 hours. No results for input(s): "AMMONIA" in the last 168 hours. CBC: Recent Labs  Lab 03/30/22 0315 03/31/22 0223 04/01/22 0144  WBC 5.9 7.0 8.2  HGB 8.5* 8.7* 9.3*  HCT 27.7* 28.3* 29.2*  MCV 85.0 85.5 83.4  PLT 317 309 314   Cardiac  Enzymes: No results for input(s): "CKTOTAL", "CKMB", "CKMBINDEX", "TROPONINI"  in the last 168 hours. BNP: BNP (last 3 results) Recent Labs    03/24/22 0415 03/28/22 0243  BNP 178.8* 167.3*    ProBNP (last 3 results) No results for input(s): "PROBNP" in the last 8760 hours.  CBG: Recent Labs  Lab 04/03/22 0827 04/03/22 1318 04/03/22 1716 04/03/22 2130 04/04/22 0826  GLUCAP 94 99 119* 152* 151*       Signed:  Domenic Polite MD.  [facility] 04/04/2022, 11:17 AM

## 2022-04-04 NOTE — Plan of Care (Signed)
  Problem: Education: Goal: Knowledge of General Education information will improve Description: Including pain rating scale, medication(s)/side effects and non-pharmacologic comfort measures Outcome: Adequate for Discharge   Problem: Health Behavior/Discharge Planning: Goal: Ability to manage health-related needs will improve Outcome: Adequate for Discharge   Problem: Clinical Measurements: Goal: Ability to maintain clinical measurements within normal limits will improve Outcome: Adequate for Discharge Goal: Will remain free from infection Outcome: Adequate for Discharge Goal: Diagnostic test results will improve Outcome: Adequate for Discharge Goal: Respiratory complications will improve Outcome: Adequate for Discharge Goal: Cardiovascular complication will be avoided Outcome: Adequate for Discharge   Problem: Activity: Goal: Risk for activity intolerance will decrease Outcome: Adequate for Discharge   Problem: Nutrition: Goal: Adequate nutrition will be maintained Outcome: Adequate for Discharge   Problem: Coping: Goal: Level of anxiety will decrease Outcome: Adequate for Discharge   Problem: Elimination: Goal: Will not experience complications related to bowel motility Outcome: Adequate for Discharge Goal: Will not experience complications related to urinary retention Outcome: Adequate for Discharge   Problem: Pain Managment: Goal: General experience of comfort will improve Outcome: Adequate for Discharge   Problem: Safety: Goal: Ability to remain free from injury will improve Outcome: Adequate for Discharge   Problem: Skin Integrity: Goal: Risk for impaired skin integrity will decrease Outcome: Adequate for Discharge   Problem: Education: Goal: Understanding of CV disease, CV risk reduction, and recovery process will improve Outcome: Adequate for Discharge Goal: Individualized Educational Video(s) Outcome: Adequate for Discharge   Problem:  Activity: Goal: Ability to return to baseline activity level will improve Outcome: Adequate for Discharge   Problem: Cardiovascular: Goal: Ability to achieve and maintain adequate cardiovascular perfusion will improve Outcome: Adequate for Discharge Goal: Vascular access site(s) Level 0-1 will be maintained Outcome: Adequate for Discharge   Problem: Health Behavior/Discharge Planning: Goal: Ability to safely manage health-related needs after discharge will improve Outcome: Adequate for Discharge   Problem: Education: Goal: Ability to describe self-care measures that may prevent or decrease complications (Diabetes Survival Skills Education) will improve Outcome: Adequate for Discharge Goal: Individualized Educational Video(s) Outcome: Adequate for Discharge   Problem: Coping: Goal: Ability to adjust to condition or change in health will improve Outcome: Adequate for Discharge   Problem: Fluid Volume: Goal: Ability to maintain a balanced intake and output will improve Outcome: Adequate for Discharge   Problem: Health Behavior/Discharge Planning: Goal: Ability to identify and utilize available resources and services will improve Outcome: Adequate for Discharge Goal: Ability to manage health-related needs will improve Outcome: Adequate for Discharge   Problem: Metabolic: Goal: Ability to maintain appropriate glucose levels will improve Outcome: Adequate for Discharge   Problem: Nutritional: Goal: Maintenance of adequate nutrition will improve Outcome: Adequate for Discharge Goal: Progress toward achieving an optimal weight will improve Outcome: Adequate for Discharge   Problem: Skin Integrity: Goal: Risk for impaired skin integrity will decrease Outcome: Adequate for Discharge   Problem: Tissue Perfusion: Goal: Adequacy of tissue perfusion will improve Outcome: Adequate for Discharge

## 2022-04-04 NOTE — Progress Notes (Signed)
Occupational Therapy Treatment Patient Details Name: Aaron [name] MRN: [id number] DOB: February 19, 1946 Today's Date: 04/04/2022   History of present illness Pt is a 76 y.o. male admitted 03/24/22 with dizziness and fall. Workup for significant anemia, concern for GIB. Brief vfib arrest in ED. S/p upper endoscopy 9/21. S/p LHC 9/22. ETT 9/21-9/22. 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   OT comments  Patient seated on EOB upon arrival. Patient with discharge orders and agreeable to working with OT for ADL training to prepare for discharge. Patient provided setup for dressing with mod assist for LB dressing due to assistance threading legs into clothing due to rib pain and supervision for UB dressing. Patient is expected to discharge home with Aaron [name].    Recommendations for follow up therapy are one component of a multi-disciplinary discharge planning process, led by the attending physician.  Recommendations may be updated based on patient status, additional functional criteria and insurance authorization.    Follow Up Recommendations  Home health OT    Assistance Recommended at Discharge Intermittent Supervision/Assistance  Patient can return home with the following  A little help with walking and/or transfers;A little help with bathing/dressing/bathroom;Assistance with cooking/housework;Direct supervision/assist for medications management;Direct supervision/assist for financial management;Assist for transportation;Help with stairs or ramp for entrance   Equipment Recommendations  Tub/shower seat    Recommendations for Other Services      Precautions / Restrictions Precautions Precautions: Fall;Other (comment) Precaution Comments: R-side rib pain from CPR Restrictions Weight Bearing Restrictions: No       Mobility Bed Mobility Overal bed mobility: Needs Assistance             General bed mobility  comments: pt sitting EOB upon arrival    Transfers Overall transfer level: Needs assistance Equipment used: Rolling walker (2 wheels), None Transfers: Sit to/from Stand Sit to Stand: Supervision           General transfer comment: stood to pull up clothing     Balance Overall balance assessment: Needs assistance Sitting-balance support: No upper extremity supported, Feet supported Sitting balance-Leahy Scale: Normal Sitting balance - Comments: able to perform dressing tasks seated on EOB   Standing balance support: No upper extremity supported, Bilateral upper extremity supported Standing balance-Leahy Scale: Good Standing balance comment: no UE support to pull up clothing                           ADL either performed or assessed with clinical judgement   ADL Overall ADL's : Needs assistance/impaired                 Upper Body Dressing : Supervision/safety Upper Body Dressing Details (indicate cue type and reason): able to donn pulover top Lower Body Dressing: Moderate assistance Lower Body Dressing Details (indicate cue type and reason): required assistance to thread legs into clothing due to rib pain but able to pull up when standing               General ADL Comments: performed dressing seated on EOB to prepare for discharge home    Extremity/Trunk Assessment Upper Extremity Assessment RUE Deficits / Details: ~90* shoulder AROM before pain, reports falling on R shoulder and has R rib pain; reports has been able to self feed and perform ADLs RUE Sensation: WNL RUE Coordination: decreased gross motor  Vision       Perception     Praxis      Cognition Arousal/Alertness: Awake/alert Behavior During Therapy: WFL for tasks assessed/performed Overall Cognitive Status: Within Functional Limits for tasks assessed Area of Impairment: Memory, Following commands, Awareness                     Memory: Decreased short-term  memory Following Commands: Follows one step commands consistently   Awareness: Anticipatory            Exercises      Shoulder Instructions       General Comments      Pertinent Vitals/ Pain       Pain Assessment Pain Assessment: Faces Faces Pain Scale: Hurts a little bit Pain Location: R rib pain from CPR Pain Descriptors / Indicators: Sore Pain Intervention(s): Monitored during session  Home Living                                          Prior Functioning/Environment              Frequency  Min 2X/week        Progress Toward Goals  OT Goals(current goals can now be found in the care plan section)  Progress towards OT goals: Progressing toward goals  Acute Rehab OT Goals Patient Stated Goal: go home OT Goal Formulation: With patient Time For Goal Achievement: 04/11/22 Potential to Achieve Goals: Good ADL Goals Pt Will Perform Upper Body Dressing: with supervision;sitting Pt Will Perform Lower Body Dressing: with supervision;sit to/from stand Pt Will Transfer to Toilet: with supervision;ambulating;regular height toilet Additional ADL Goal #1: pt will be able to stand x5 min for functional task in order to improve activity tolerance for ADLs  Plan Discharge plan remains appropriate;Frequency remains appropriate    Co-evaluation                 AM-PAC OT "6 Clicks" Daily Activity     Outcome Measure   Help from another person eating meals?: None Help from another person taking care of personal grooming?: A Little Help from another person toileting, which includes using toliet, bedpan, or urinal?: A Little Help from another person bathing (including washing, rinsing, drying)?: A Lot Help from another person to put on and taking off regular upper body clothing?: A Little Help from another person to put on and taking off regular lower body clothing?: A Lot 6 Click Score: 17    End of Session Equipment Utilized During  Treatment: Rolling walker (2 wheels)  OT Visit Diagnosis: Unsteadiness on feet (R26.81);Repeated falls (R29.6);Muscle weakness (generalized) (M62.81)   Activity Tolerance Patient tolerated treatment well   Patient Left in bed;with call bell/phone within reach;Other (comment) (seated on EOB)   Nurse Communication Mobility status        Time: 2297-9892 OT Time Calculation (min): 21 min  Charges: OT General Charges $OT Visit: 1 Visit OT Treatments $Self Care/Home Management : 8-22 mins  Lodema Hong, OTA Acute Rehabilitation Services  Office [phone]   Trixie Dredge 04/04/2022, 11:10 AM

## 2022-04-04 NOTE — Consult Note (Signed)
   [facility] CM Inpatient Consult   04/04/2022  Aaron [name] Mar 02, 1946 [id number]  [name] [ACO] Patient:  [name]  Primary Care Provider:  [name], MD, [place] [facility]  Patient screened for length of stay hospitalization to assess for potential [place] Management service needs for post hospital transition.  Review of patient's medical record reveals patient is transitioned home with home health ordered.  3:42 pm Call attempt to follow up on post hospital needs but call was unsuccessful. Refer for follow up calls from provider.  For questions contact:   Natividad Brood, RN BSN [facility] Liaison  [phone] business mobile phone Toll free office [phone]  Fax number: [phone] [email or web address]

## 2022-04-05 ENCOUNTER — Telehealth: Payer: Self-pay | Admitting: *Deleted

## 2022-04-05 ENCOUNTER — Telehealth: Payer: Self-pay | Admitting: Internal Medicine

## 2022-04-05 NOTE — Chronic Care Management (AMB) (Signed)
  Care Coordination  Outreach Note  04/05/2022 Name: Aaron [name] MRN: [id number] DOB: 05-01-1946   Care Coordination Outreach Attempts: An unsuccessful telephone outreach was attempted today to offer the patient information about available care coordination services as a benefit of their health plan.   Referral received   Follow Up Plan:  Additional outreach attempts will be made to offer the patient care coordination information and services.   Encounter Outcome:  No Answer  Julian Hy, [place] Direct Dial: [phone]

## 2022-04-05 NOTE — Telephone Encounter (Signed)
Tonya with [facility] called to let us know that the start of care date for physical therapy will be Oct. 5.

## 2022-04-05 NOTE — Chronic Care Management (AMB) (Signed)
  Care Coordination   Note   04/05/2022 Name: Aaron [name] MRN: [id number] DOB: 1945/07/15  Aaron [name] is a 76 y.o. year old male who sees [name], MD for primary care. I reached out to [name] by phone today to offer care coordination services.  [name] was given information about Care Coordination services today including:   The Care Coordination services include support from the care team which includes your Nurse Coordinator, Clinical Social Worker, or Pharmacist.  The Care Coordination team is here to help remove barriers to the health concerns and goals most important to you. Care Coordination services are voluntary, and the patient may decline or stop services at any time by request to their care team member.   Care Coordination Consent Status: Patient agreed to services and verbal consent obtained.   Follow up plan:  Telephone appointment with care coordination team member scheduled for:  04/07/2022  Encounter Outcome:  Pt. Scheduled from referral  Julian Hy, [place] Direct Dial: [phone]

## 2022-04-05 NOTE — Chronic Care Management (AMB) (Signed)
Duplicate

## 2022-04-05 NOTE — Telephone Encounter (Signed)
Noted  

## 2022-04-06 ENCOUNTER — Ambulatory Visit (HOSPITAL_COMMUNITY)
Admission: RE | Admit: 2022-04-06 | Discharge: 2022-04-06 | Disposition: A | Discharge status: Home / Self Care | Payer: Medicare Other | Source: Ambulatory Visit | Attending: Cardiology | Admitting: Cardiology

## 2022-04-06 ENCOUNTER — Encounter (HOSPITAL_COMMUNITY): Payer: Self-pay

## 2022-04-06 ENCOUNTER — Other Ambulatory Visit (HOSPITAL_COMMUNITY): Payer: Self-pay

## 2022-04-06 ENCOUNTER — Telehealth (HOSPITAL_COMMUNITY): Payer: Self-pay

## 2022-04-06 VITALS — BP 140/70 | HR 98 | Wt 234.8 lb

## 2022-04-06 DIAGNOSIS — Z7984 Long term (current) use of oral hypoglycemic drugs: Secondary | ICD-10-CM | POA: Diagnosis not present

## 2022-04-06 DIAGNOSIS — Z87891 Personal history of nicotine dependence: Secondary | ICD-10-CM | POA: Diagnosis not present

## 2022-04-06 DIAGNOSIS — E669 Obesity, unspecified: Secondary | ICD-10-CM | POA: Diagnosis not present

## 2022-04-06 DIAGNOSIS — Z79899 Other long term (current) drug therapy: Secondary | ICD-10-CM | POA: Diagnosis not present

## 2022-04-06 DIAGNOSIS — I5032 Chronic diastolic (congestive) heart failure: Secondary | ICD-10-CM

## 2022-04-06 DIAGNOSIS — Z86718 Personal history of other venous thrombosis and embolism: Secondary | ICD-10-CM | POA: Diagnosis not present

## 2022-04-06 DIAGNOSIS — I110 Hypertensive heart disease with heart failure: Secondary | ICD-10-CM | POA: Diagnosis not present

## 2022-04-06 DIAGNOSIS — I2510 Atherosclerotic heart disease of native coronary artery without angina pectoris: Secondary | ICD-10-CM | POA: Insufficient documentation

## 2022-04-06 DIAGNOSIS — D509 Iron deficiency anemia, unspecified: Secondary | ICD-10-CM | POA: Insufficient documentation

## 2022-04-06 DIAGNOSIS — Z955 Presence of coronary angioplasty implant and graft: Secondary | ICD-10-CM | POA: Diagnosis not present

## 2022-04-06 DIAGNOSIS — Z86711 Personal history of pulmonary embolism: Secondary | ICD-10-CM | POA: Diagnosis not present

## 2022-04-06 LAB — BASIC METABOLIC PANEL
Anion gap: 8 (ref 5–15)
BUN: 24 mg/dL — ABNORMAL HIGH (ref 8–23)
CO2: 24 mmol/L (ref 22–32)
Calcium: 8.8 mg/dL — ABNORMAL LOW (ref 8.9–10.3)
Chloride: 105 mmol/L (ref 98–111)
Creatinine, Ser: 1.03 mg/dL (ref 0.61–1.24)
GFR, Estimated: >60 mL/min (ref >60)
Glucose, Bld: 91 mg/dL (ref 70–99)
Potassium: 4.4 mmol/L (ref 3.5–5.1)
Sodium: 137 mmol/L (ref 135–145)

## 2022-04-06 LAB — CBC
HCT: 31.4 % — ABNORMAL LOW (ref 39.0–52.0)
Hemoglobin: 9.6 g/dL — ABNORMAL LOW (ref 13.0–17.0)
MCH: 26.4 pg (ref 26.0–34.0)
MCHC: 30.6 g/dL (ref 30.0–36.0)
MCV: 86.3 fL (ref 80.0–100.0)
Platelets: 408 10*3/uL — ABNORMAL HIGH (ref 150–400)
RBC: 3.64 MIL/uL — ABNORMAL LOW (ref 4.22–5.81)
RDW: 15.9 % — ABNORMAL HIGH (ref 11.5–15.5)
WBC: 7.7 10*3/uL (ref 4.0–10.5)
nRBC: 0 % (ref 0.0–0.2)

## 2022-04-06 LAB — BRAIN NATRIURETIC PEPTIDE: B Natriuretic Peptide: 48 pg/mL (ref 0.0–100.0)

## 2022-04-06 MED ORDER — ENTRESTO 24-26 MG PO TABS: 1.0000 | ORAL_TABLET | Freq: Two times a day (BID) | ORAL | 0 refills | Status: DC

## 2022-04-06 NOTE — Progress Notes (Signed)
HEART & VASCULAR TRANSITION OF CARE CONSULT NOTE     Referring Physician: Dr. Broadus John  Primary Care: [name], MD Primary Cardiologist: Peter Martinique, MD  HPI: Referred to clinic by Dr. Broadus John, Internal Medicine, for heart failure consultation.   76 y/o male w/ h/o recurrent DVT/PEs previously on Xarelto, chronic iron deficiency anermia requring frequent blood transfusions, CAD w/ prior PCI to LAD, recently admitted 9/23 w/ profound anemia. Had presented to ED due to progressive dyspnea, weakness and fall at home. EKG showed ST depressions. Hgb markedly low at 4.8. While in the ED, he had V-fib arrest treated w/ CPR and defibrillation x 1. Admitted to the ICU and transfused 3u RBCs. Once stabilized, underwent EGD which revealed GAVE (gastric antral vascular ectasia), treated with APC. Felt to be extremity high risk for rebleed. GI recommended complete discontinuation of anticoagulation. Given h/o recurrent DVT/PEs, IR was consulted and placed IVC filter.   Cardiology was consulted post VFib arrest and for acute CHF w/ volume overload. Hs trop peaked 5,431. 2D echo showed preserved LV systolic fx w/ EF 10-25%, mild LVH, RV nl. Underwent LHC that showed mild-mod nonobstructive CAD and patent mLAD stent. Cause of VF felt due to profound anemia. He was diuresed w/ IV Lasix and placed on GDMT. Discharged home w/ LifeVest. Referred to [facility] clinic.   He presents today for f/u. Here w/ wife. Reports doing well. Denies any further resting dyspnea. Able to do basic ADLs w/o much limitation/exertional dyspnea. No CP. Denies palpitations, dizziness, syncope/ near syncope. Wearing LifeVest. No alarms. Has mild LEE on exam. Reports full compliance w/ meds. BP moderately elevated 140/70. Denies melena/ hematochezia.    Cardiac Testing   [facility] 03/25/22   Dist LAD lesion is 10% stenosed.   1st Mrg lesion is 30% stenosed.   1.  Diffuse mild to moderate obstructive disease with patent mid LAD stent. 2.   Elevated LVEDP of 25 mmHg.  2D Echo 9/23 Study not well visualized to full assess endocardial borders. Left ventricular ejection fraction, by estimation, is 55 to 60%. The left ventricle has normal function. The left ventricle has no regional wall motion abnormalities. There is mild left ventricular hypertrophy. Left ventricular diastolic parameters were normal. 1. Right ventricular systolic function is normal. The right ventricular size is normal. There is normal pulmonary artery systolic pressure. 2. 3. The mitral valve is normal in structure. No evidence of mitral valve regurgitation. 4. The aortic valve was not well visualized. Aortic valve regurgitation is not visualized. The inferior vena cava is normal in size with greater than 50% respiratory variability, suggesting right atrial pressure of 3 mmHg.    Review of Systems: [y] = yes, '[ ]'$  = no   General: Weight gain '[ ]'$ ; Weight loss '[ ]'$ ; Anorexia '[ ]'$ ; Fatigue '[ ]'$ ; Fever '[ ]'$ ; Chills '[ ]'$ ; Weakness [ Y]  Cardiac: Chest pain/pressure '[ ]'$ ; Resting SOB '[ ]'$ ; Exertional SOB '[ ]'$ ; Orthopnea '[ ]'$ ; Pedal Edema [Y ]; Palpitations '[ ]'$ ; Syncope '[ ]'$ ; Presyncope '[ ]'$ ; Paroxysmal nocturnal dyspnea'[ ]'$   Pulmonary: Cough '[ ]'$ ; Wheezing'[ ]'$ ; Hemoptysis'[ ]'$ ; Sputum '[ ]'$ ; Snoring '[ ]'$   GI: Vomiting'[ ]'$ ; Dysphagia'[ ]'$ ; Melena'[ ]'$ ; Hematochezia '[ ]'$ ; Heartburn'[ ]'$ ; Abdominal pain '[ ]'$ ; Constipation '[ ]'$ ; Diarrhea '[ ]'$ ; BRBPR '[ ]'$   GU: Hematuria'[ ]'$ ; Dysuria '[ ]'$ ; Nocturia'[ ]'$   Vascular: Pain in legs with walking '[ ]'$ ; Pain in feet with lying flat '[ ]'$ ; Non-healing sores '[ ]'$ ; Stroke '[ ]'$ ;  TIA '[ ]'$ ; Slurred speech '[ ]'$ ;  Neuro: Headaches'[ ]'$ ; Vertigo'[ ]'$ ; Seizures'[ ]'$ ; Paresthesias'[ ]'$ ;Blurred vision '[ ]'$ ; Diplopia '[ ]'$ ; Vision changes '[ ]'$   Ortho/Skin: Arthritis '[ ]'$ ; Joint pain '[ ]'$ ; Muscle pain '[ ]'$ ; Joint swelling '[ ]'$ ; Back Pain '[ ]'$ ; Rash '[ ]'$   Psych: Depression'[ ]'$ ; Anxiety'[ ]'$   Heme: Bleeding problems [ Y]; Clotting disorders '[ ]'$ ; Anemia [Y ]  Endocrine: Diabetes '[ ]'$ ; Thyroid dysfunction[  ]   Past Medical History:  Diagnosis Date   Anemia    "@ birth"   Anxiety    Blood transfusion    "@ birth"   Cancer [facility])    Head - skin cancer   Constipation due to pain medication    Coronary artery disease    DES LAD 2005   Depression    Full dentures    GERD (gastroesophageal reflux disease)    Headache(784.0)    "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; 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"   Heart murmur    Hyperlipidemia    Hypertension    Insomnia    Osteoarthritis    PONV (postoperative nausea and vomiting)    2006 - knee surgery   Pre-diabetes    Psoriasis    PTSD (post-traumatic stress disorder)    Pulmonary embolism ([place]) ~ 03/2006   bilaterally   Seasonal allergies    Shortness of breath 06/08/11   "w/exertion; that's why I'm here"   Sleep apnea, obstructive    Umbilical hernia     Current Outpatient Medications  Medication Sig Dispense Refill   amLODipine (NORVASC) 5 MG tablet TAKE 1 TABLET BY MOUTH EVERYDAY AT BEDTIME (Patient taking differently: Take 5 mg by mouth at bedtime.) 90 tablet 0   aspirin EC 81 MG tablet Take 1 tablet (81 mg total) by mouth daily. Swallow whole. 30 tablet 0   atorvastatin (LIPITOR) 80 MG tablet TAKE 1 TABLET BY MOUTH ONCE  DAILY 100 tablet 0   Carboxymethylcellulose Sodium 1 % GEL Place 1 application into both eyes at bedtime as needed (Dry eye).     clonazePAM (KLONOPIN) 0.5 MG tablet TAKE 1 TABLET BY MOUTH AT BEDTIME. 90 tablet 0   empagliflozin (JARDIANCE) 10 MG TABS tablet Take 1 tablet (10 mg total) by mouth daily. 30 tablet 0   ezetimibe (ZETIA) 10 MG tablet TAKE 1 TABLET BY MOUTH  DAILY (Patient taking differently: Take 10 mg by mouth daily.) 90 tablet 3   ferrous sulfate 325 (65 FE) MG EC tablet TAKE 1 TABLET BY MOUTH DAILY WITH BREAKFAST. SCHEDULE AN APPOINTMENT FOR FURTHER REFILLS (Patient taking differently: Take 325 mg by mouth daily with breakfast.) 60 tablet 3   furosemide (LASIX) 40 MG tablet Take 1 tablet (40 mg total) by mouth daily. 30 tablet  0   hydrALAZINE (APRESOLINE) 25 MG tablet Take 1 tablet (25 mg total) by mouth 3 (three) times daily. 90 tablet 0   pantoprazole (PROTONIX) 40 MG tablet Take 1 tablet (40 mg total) by mouth daily. 90 tablet 1   polyethylene glycol (MIRALAX / GLYCOLAX) 17 g packet Take 17 g by mouth daily. With coffee     spironolactone (ALDACTONE) 25 MG tablet Take 1 tablet (25 mg total) by mouth daily. 30 tablet 0   tiZANidine (ZANAFLEX) 4 MG tablet Take 4 mg by mouth every 8 (eight) hours as needed for muscle spasms.     traMADol (ULTRAM) 50 MG tablet TAKE 2 TABLETS (100 MG TOTAL) BY MOUTH EVERY 6 (SIX) HOURS  AS NEEDED FOR MODERATE PAIN - SEVERE PAIN (Patient taking differently: Take 100 mg by mouth every 6 (six) hours as needed for moderate pain.) 240 tablet 0   traZODone (DESYREL) 50 MG tablet TAKE 1 TABLET BY MOUTH EVERYDAY AT BEDTIME (Patient taking differently: Take 50 mg by mouth at bedtime.) 90 tablet 1   venlafaxine XR (EFFEXOR-XR) 150 MG 24 hr capsule TAKE 1 CAPSULE BY MOUTH DAILY  WITH BREAKFAST (Patient taking differently: Take 150 mg by mouth daily with breakfast.) 90 capsule 0   Daridorexant HCl (QUVIVIQ) 50 MG TABS Take 1 tablet by mouth at bedtime. (Patient not taking: Reported on 03/24/2022) 90 tablet 1   No current facility-administered medications for this encounter.    Allergies  Allergen Reactions   Wellbutrin [Bupropion] Other (See Comments)    Hallucinations, sweating   Amoxicillin Palpitations      Social History   Socioeconomic History   Marital status: Married    Spouse name: Not on file   Number of children: 3   Years of education: Not on file   Highest education level: Not on file  Occupational History   Occupation: building maintenance    Comment: disabled  Tobacco Use   Smoking status: Former    Packs/day: 1.50    Years: 25.00    Total pack years: 37.50    Types: Cigarettes    Quit date: 07/10/1995    Years since quitting: 26.7   Smokeless tobacco: Never  Vaping  Use   Vaping Use: Never used  Substance and Sexual Activity   Alcohol use: No   Drug use: No   Sexual activity: Not on file  Other Topics Concern   Not on file  Social History Narrative   Former Curator, 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   Recent stress, currently undergoing financial difficulties.   Social Determinants of Health   Financial Resource Strain: Low Risk  (10/29/2021)   Overall Financial Resource Strain (CARDIA)    Difficulty of Paying Living Expenses: Not hard at all  Food Insecurity: No Food Insecurity (03/29/2022)   Hunger Vital Sign    Worried About Running Out of Food in the Last Year: Never true    Ran Out of Food in the Last Year: Never true  Transportation Needs: No Transportation Needs (03/29/2022)   PRAPARE - Hydrologist (Medical): No    Lack of Transportation (Non-Medical): No  Physical Activity: Insufficiently Active (10/29/2021)   Exercise Vital Sign    Days of Exercise per Week: 3 days    Minutes of Exercise per Session: 30 min  Stress: No Stress Concern Present (10/29/2021)   [place]    Feeling of Stress : Not at all  Social Connections: Socially Isolated (10/29/2021)   Social Connection and Isolation Panel [NHANES]    Frequency of Communication with Friends and Family: Three times a week    Frequency of Social Gatherings with Friends and Family: Three times a week    Attends Religious Services: Never    Active Member of Clubs or Organizations: No    Attends Archivist Meetings: Never    Marital Status: Widowed  Intimate Partner Violence: Not At Risk (03/29/2022)   Humiliation, Afraid, Rape, and Kick questionnaire    Fear of Current or Ex-Partner: No    Emotionally Abused: No    Physically Abused: No    Sexually Abused: No  Family History  Problem Relation Age of Onset   Asthma Father    Emphysema Father    Breast cancer  Sister    Liver cancer Sister    Asthma Sister    Asthma Brother     Vitals:   04/06/22 1523  BP: (!) 140/70  Pulse: 98  SpO2: 97%  Weight: 106.5 kg (234 lb 12.8 oz)    PHYSICAL EXAM: General:  Well appearing, moderately obese. No respiratory difficulty HEENT: normal Neck: supple. no JVD. Carotids 2+ bilat; no bruits. No lymphadenopathy or thryomegaly appreciated. Cor: PMI nondisplaced. Regular rate & rhythm. No rubs, gallops or murmurs. Lungs: clear Abdomen: soft, nontender, nondistended. No hepatosplenomegaly. No bruits or masses. Good bowel sounds. Extremities: no cyanosis, clubbing, rash, 1+ b/l pretibial edema Neuro: alert & oriented x 3, cranial nerves grossly intact. moves all 4 extremities w/o difficulty. Affect pleasant.  ECG:  Not performed    ASSESSMENT & PLAN:  1. In Hospital VF Arrest - treated w/ prompt CPR + Defib x 1. LHC w/ nonosbructive dz. LVEF normal. EKG QTC 464 ms  - suspect precipitated by severe anemia - he is wearing LifeVest. Plan to continue per cardiology. If detection of recurrent VF/VT, will need ICD   2. Chronic Diastolic Heart Failure - Echo 9/23 EF 55%, RV ok - NYHA Class II, confounded by obesity/deconditioning and chronic anemia - Mild fluid overload on exam. [place] [address] - Add Entresto 24-26 mg bid - Continue Jardiance 10 mg daily - Continue Spironolactone 25 mg daily  - Continue Lasix 40 mg daily  - Advised to add TED hoses  - He has f/u w/ gen cards next week. Advised to keep this appt for f/u to reassess volume status/ response to Entresto  - check BMP and BNP today. Recommend repeating BMP next week   3. CAD - s/p remote LAD PCI - LHC 9/23 w/ patent stent and mild nonob dz - continue ASA + statin   4. Recent GIB/ ABLA - Hgb dropped to 4.8, transfused 4 u RBC - EGD showed GAVE (gastric antral vascular ectasia), treated w/ APC - high risk for rebleed, no longer on a/c (Xarelto discontinued) - denies recurrent melena. No  hematochezia - check f/u CBC today   5. H/o Recurrent DVT/PEs - no longer on a/c given recent severe GIB/ ABLA - s/p IVC Filter 9/23    NYHA II GDMT  Diuretic- Lasix 40 mg daily  BB- No  Ace/ARB/ARNI Entresto 24-26 mg bid  MRA spironolactone 25 mg daily  SGLT2i Jardiance 10 mg daily     Referred to HFSW (PCP, Medications, Transportation, ETOH Abuse, Drug Abuse, Insurance, Museum/gallery curator ):  No Refer to Pharmacy:  No Refer to Home Health: No Refer to Advanced Heart Failure Clinic: No  Refer to General Cardiology: Yes (Dr. Martinique)   Follow up  w/ Cardiology as planned next week.

## 2022-04-06 NOTE — Patient Instructions (Addendum)
Labs done today. We will contact you only if your labs are abnormal.  START Entresto 24-'26mg'$  (1 tablet) by mouth 2 times daily.   No other medication changes were made. Please continue all current medications as prescribed.  Your physician recommends that you keep your scheduled follow-up appointment with Cardiology.   Do the following things EVERYDAY: Weigh yourself in the morning before breakfast. Write it down and keep it in a log. Take your medicines as prescribed Eat low salt foods--Limit salt (sodium) to 2000 mg per day.  Stay as active as you can everyday Limit all fluids for the day to less than 2 liters

## 2022-04-06 NOTE — Telephone Encounter (Signed)
Call attempted to confirm HV TOC appt today at 3pm. HIPPA appropriate VM left with callback number.   Oscar Forman, MSN, RN Heart Failure Nurse Navigator  

## 2022-04-07 ENCOUNTER — Ambulatory Visit: Payer: Self-pay

## 2022-04-07 ENCOUNTER — Encounter: Payer: Self-pay | Admitting: *Deleted

## 2022-04-07 DIAGNOSIS — D62 Acute posthemorrhagic anemia: Secondary | ICD-10-CM | POA: Diagnosis not present

## 2022-04-07 DIAGNOSIS — I130 Hypertensive heart and chronic kidney disease with heart failure and stage 1 through stage 4 chronic kidney disease, or unspecified chronic kidney disease: Secondary | ICD-10-CM | POA: Diagnosis not present

## 2022-04-07 DIAGNOSIS — D509 Iron deficiency anemia, unspecified: Secondary | ICD-10-CM | POA: Diagnosis not present

## 2022-04-07 DIAGNOSIS — K31819 Angiodysplasia of stomach and duodenum without bleeding: Secondary | ICD-10-CM | POA: Diagnosis not present

## 2022-04-07 DIAGNOSIS — I5021 Acute systolic (congestive) heart failure: Secondary | ICD-10-CM | POA: Diagnosis not present

## 2022-04-07 DIAGNOSIS — Z006 Encounter for examination for normal comparison and control in clinical research program: Secondary | ICD-10-CM

## 2022-04-07 DIAGNOSIS — I5033 Acute on chronic diastolic (congestive) heart failure: Secondary | ICD-10-CM | POA: Diagnosis not present

## 2022-04-07 NOTE — Patient Instructions (Signed)
Visit Information  Thank you for taking time to visit with me today. Please don't hesitate to contact me if I can be of assistance to you.   Following are the goals we discussed today:   Goals Addressed             This Visit's Progress    Care coordination post hospitalization       Care Coordination Interventions: Confirmed home health agency started care today for Physical therapy Reviewed instructions per heart and vascular transition of care on 04/06/22. Encouraged to continue to weight and record weights, monitor salt intake <2000 mg/day, monitor fluid intake < 2l/day, be as active as possible Confirmed home health agency started care today for Physical therapy Medications reviewed, encouraged to continue to take medications as prescribed. Encouraged to complete application assistance form for Entresto as soon as possible to take to provider to complete their portion Upcoming/scheduled appointments reviewed with patient.       Our next appointment is by telephone on 04/20/22 at 2:00pm  Please call the care guide team at [phone] if you need to cancel or reschedule your appointment.   If you are experiencing a Mental Health or [place] or need someone to talk to, please call the Suicide and Crisis Lifeline: 988  Patient verbalizes understanding of instructions and care plan provided today and agrees to view in [place]. Active MyChart status and patient understanding of how to access instructions and care plan via MyChart confirmed with patient.     Thea Silversmith, RN, MSN, BSN, [place] Coordinator [phone]

## 2022-04-07 NOTE — Patient Outreach (Signed)
  Care Coordination   Follow Up Visit Note   04/07/2022 Name: Aaron [name] MRN: [id number] DOB: Apr 15, 1946  Aaron [name] is a 76 y.o. year old male who sees Aaron Lima, MD for primary care. I spoke with  Aaron [name] by phone today.  What matters to the patients health and wellness today?  Admission 03/24/22-04/04/22 with Vfib, hgb 4.8. patient reports he is doing much better. He reports home health physical therapy start of care today. He states he has followed up with heart and vasculare cardiology. He states he is weighing and recording weights daily. He reports he is wearing a life vest. He reports when he needs to get refill on Entresto it will cost $187/month. He states he has been given forms to complete to see if he qualifies for assistance.   Goals Addressed             This Visit's Progress    Care coordination post hospitalization       Care Coordination Interventions: Confirmed home health agency started care today for Physical therapy Reviewed instructions per heart and vascular transition of care on 04/06/22. Encouraged to continue to weight and record weights, monitor salt intake <2000 mg/day, monitor fluid intake < 2l/day, be as active as possible Confirmed home health agency started care today for Physical therapy Medications reviewed, encouraged to continue to take medications as prescribed. Encouraged to complete application assistance form for Entresto as soon as possible to take to provider to complete their portion Upcoming/scheduled appointments reviewed with patient.       SDOH assessments and interventions completed:  Yes  SDOH Interventions Today    Flowsheet Row Most Recent Value  SDOH Interventions   Food Insecurity Interventions Intervention Not Indicated  Housing Interventions Intervention Not Indicated  Transportation Interventions Intervention Not Indicated  Utilities Interventions Intervention Not Indicated     Care Coordination  Interventions Activated:  Yes  Care Coordination Interventions:  Yes, provided   Follow up plan: Follow up call scheduled for 04/20/22    Encounter Outcome:  Pt. Visit Completed   Aaron Silversmith, RN, MSN, BSN, [place] Coordinator [phone]

## 2022-04-07 NOTE — Progress Notes (Signed)
Cardiology Office Note:    Date:  04/11/2022   ID:  Aaron [name], DOB 11-24-1945, MRN [id number]  PCP:  [name], MD   [facility] Prov[facility] Cardiologist:  Peter Martinique, MD Cardiology APP:  Ledora [place], [place]     Referring MD: [name], MD   Chief Complaint  Patient presents with   Hospitalization Follow-up    Vfib arrest    History of Present Illness:    Aaron [name] is a 76 y.o. male with a hx of hypertension, coronary artery disease, recurrent PE/DVTs on xarelto, GERD, hyperlipidemia, and obesity.  He has a history of stenting to his mid LAD in 2005 following a nuclear stress test in that showed evidence of anterior apical ischemia.  Follow-up Myoview in 2014 was normal.  He was anticoagulated on Xarelto for recurrent PE/DVT, but with a history of chronic IDA requiring transfusions.  Hypertension was managed by his PCP. He was recently hospitalized 03/2022 with an in-hospital Vfib arrest in the setting of GI bleed and Hb of 4.8. Initial EKG with ST depressions. LHC revealed nonobstructive CAD, no intervention. Telemetry with sinus rhythm. Suspected a reversible etiology in the setting of severe anemia. However, due to risk of re-bleed, he was discharged with a lifevest.  GI workup notable for EGD showing gastric antral vascular ectasia treated with APC, felt to be extremely high risk for re-bleed. GI recommended complete discontinuation of [place]. There fore, Xarelto held and IVC filter was placed by IR.   He was seen in [facility] clinic with mild volume overload. Low dose entresto was added along with continuation of jardiance, spiro, and lasix 40 mg.   He reports to clinic today for follow-up.  He is here with his wife.  He is not driving as instructed.  Many questions answered.  I did relay to him that I directly communicated with Dr. Martinique and advised LifeVest for 6 weeks.  If he has no replete her arrhythmia then he can discontinue this.  Unfortunately he  had issues getting the [facility] [address].  He has only been taking it for about 2 days.  I will bring him back in about 1 week to recheck labs.  He does not appear significantly volume up on exam today.  He did notice an immediate increase in urination.  We reviewed his medications extensively.  He reported that Delene Loll may be too expensive to continue long-term.   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"   Heart murmur    Hyperlipidemia    Hypertension    Insomnia    Osteoarthritis    PONV (postoperative nausea and vomiting)    2006 - knee surgery   Pre-diabetes    Psoriasis    PTSD (post-traumatic stress disorder)    Pulmonary embolism ([place]) ~ 03/2006   bilaterally   Seasonal allergies    Shortness of breath 06/08/11   "w/exertion; that's why I'm here"   Sleep apnea, obstructive    Umbilical  hernia     Past Surgical History:  Procedure Laterality Date   BUBBLE STUDY  03/24/2022   Procedure: BUBBLE STUDY;  Surgeon: Thornton Park, MD;  Location: [place];  Service: Gastroenterology;;   CARDIAC CATHETERIZATION     carpel tunnel release Bilateral ~ 06/2005/~ 08/2005   right/left   COLONOSCOPY W/ BIOPSIES AND POLYPECTOMY     CORONARY ANGIOPLASTY     CORONARY STENT PLACEMENT  02/13/2004   1   ENUCLEATION Right    age 58   ESOPHAGOGASTRODUODENOSCOPY (EGD) WITH PROPOFOL N/A 03/24/2022   Procedure: ESOPHAGOGASTRODUODENOSCOPY (EGD) WITH PROPOFOL;  Surgeon: Thornton Park, MD;  Location: [place];  Service: Gastroenterology;  Laterality: N/A;   EYE SURGERY     age 35; "ruptured  right pupil"   EYE SURGERY     age 46   [place]   placed artificial right eye   HEMOSTASIS CONTROL  03/24/2022   Procedure: HEMOSTASIS CONTROL;  Surgeon: Thornton Park, MD;  Location: [place];  Service: Gastroenterology;;   HOT HEMOSTASIS N/A 03/24/2022   Procedure: HOT HEMOSTASIS (ARGON PLASMA COAGULATION/BICAP);  Surgeon: Thornton Park, MD;  Location: [place];  Service: Gastroenterology;  Laterality: N/A;   INGUINAL HERNIA REPAIR Right 1983   INGUINAL HERNIA REPAIR Left 1985   IR IVC FILTER PLMT / S&I /IMG GUID/MOD SED  03/29/2022   KNEE ARTHROSCOPY  ~ 06/2005   right   LEFT HEART CATH AND CORONARY ANGIOGRAPHY N/A 03/25/2022   Procedure: LEFT HEART CATH AND CORONARY ANGIOGRAPHY;  Surgeon: Early O[place]ond, MD;  Location: [place] [facility];  Service: Cardiovascular;  Laterality: N/A;   MULTIPLE TOOTH EXTRACTIONS     spinal injections  2006-2007   "maybe 10 injections for herniated discs"   TEE WITHOUT CARDIOVERSION  03/24/2022   Procedure: TRANSESOPHAGEAL ECHOCARDIOGRAM (TEE);  Surgeon: Thornton Park, MD;  Location: [place];  Service: Gastroenterology;;   TONSILLECTOMY     "when I was real young"   TOTAL KNEE ARTHROPLASTY Left 10/09/2015   Procedure: TOTAL KNEE ARTHROPLASTY;  Surgeon: Dorna Leitz, MD;  Location: [place];  Service: Orthopedics;  Laterality: Left;   TOTAL KNEE ARTHROPLASTY Right 04/04/2016   Procedure: TOTAL KNEE ARTHROPLASTY;  Surgeon: Dorna Leitz, MD;  Location: [place];  Service: Orthopedics;  Laterality: Right;    Current Medications: Current Meds  Medication Sig   amLODipine (NORVASC) 5 MG tablet TAKE 1 TABLET BY MOUTH EVERYDAY AT BEDTIME (Patient taking differently: Take 5 mg by mouth at bedtime.)   aspirin EC 81 MG tablet Take 1 tablet (81 mg total) by mouth daily. Swallow whole.   atorvastatin (LIPITOR) 80 MG tablet TAKE 1 TABLET BY MOUTH ONCE  DAILY   Carboxymethylcellulose Sodium 1 % GEL Place 1 application into both eyes at bedtime as needed (Dry eye).   clonazePAM (KLONOPIN) 0.5 MG  tablet TAKE 1 TABLET BY MOUTH AT BEDTIME.   empagliflozin (JARDIANCE) 10 MG TABS tablet Take 1 tablet (10 mg total) by mouth daily.   ezetimibe (ZETIA) 10 MG tablet TAKE 1 TABLET BY MOUTH  DAILY (Patient taking differently: Take 10 mg by mouth daily.)   ferrous sulfate 325 (65 FE) MG EC tablet TAKE 1 TABLET BY MOUTH DAILY WITH BREAKFAST. SCHEDULE AN APPOINTMENT FOR FURTHER REFILLS (Patient taking differently: Take 325 mg by mouth daily with breakfast.)   furosemide (LASIX) 40 MG tablet Take 1 tablet (40 mg total) by mouth daily.   hydrALAZINE (APRESOLINE) 25 MG tablet Take 1 tablet (25 mg total)  by mouth 3 (three) times daily.   pantoprazole (PROTONIX) 40 MG tablet Take 1 tablet (40 mg total) by mouth daily.   polyethylene glycol (MIRALAX / GLYCOLAX) 17 g packet Take 17 g by mouth daily. With coffee   sacubitril-valsartan (ENTRESTO) 24-26 MG Take 1 tablet by mouth 2 (two) times daily.   spironolactone (ALDACTONE) 25 MG tablet Take 1 tablet (25 mg total) by mouth daily.   tiZANidine (ZANAFLEX) 4 MG tablet Take 4 mg by mouth every 8 (eight) hours as needed for muscle spasms.   traMADol (ULTRAM) 50 MG tablet TAKE 2 TABLETS (100 MG TOTAL) BY MOUTH EVERY 6 (SIX) HOURS AS NEEDED FOR MODERATE PAIN - SEVERE PAIN (Patient taking differently: Take 100 mg by mouth every 6 (six) hours as needed for moderate pain.)   traZODone (DESYREL) 50 MG tablet TAKE 1 TABLET BY MOUTH EVERYDAY AT BEDTIME (Patient taking differently: Take 50 mg by mouth at bedtime.)   venlafaxine XR (EFFEXOR-XR) 150 MG 24 hr capsule TAKE 1 CAPSULE BY MOUTH DAILY  WITH BREAKFAST (Patient taking differently: Take 150 mg by mouth daily with breakfast.)     Allergies:   Wellbutrin [bupropion] and Amoxicillin   Social History   Socioeconomic History   Marital status: Married    Spouse name: Not on file   Number of children: 3   Years of education: Not on file   Highest education level: Not on file  Occupational History   Occupation:  building maintenance    Comment: disabled  Tobacco Use   Smoking status: Former    Packs/day: 1.50    Years: 25.00    Total pack years: 37.50    Types: Cigarettes    Quit date: 07/10/1995    Years since quitting: 26.7   Smokeless tobacco: Never  Vaping Use   Vaping Use: Never used  Substance and Sexual Activity   Alcohol use: No   Drug use: No   Sexual activity: Not on file  Other Topics Concern   Not on file  Social History Narrative   Former Curator, 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   Recent stress, currently undergoing financial difficulties.   Social Determinants of Health   Financial Resource Strain: Low Risk  (10/29/2021)   Overall Financial Resource Strain (CARDIA)    Difficulty of Paying Living Expenses: Not hard at all  Food Insecurity: No Food Insecurity (04/07/2022)   Hunger Vital Sign    Worried About Running Out of Food in the Last Year: Never true    Ran Out of Food in the Last Year: Never true  Transportation Needs: No Transportation Needs (04/07/2022)   PRAPARE - Hydrologist (Medical): No    Lack of Transportation (Non-Medical): No  Physical Activity: Insufficiently Active (10/29/2021)   Exercise Vital Sign    Days of Exercise per Week: 3 days    Minutes of Exercise per Session: 30 min  Stress: No Stress Concern Present (10/29/2021)   [place]    Feeling of Stress : Not at all  Social Connections: Socially Isolated (10/29/2021)   Social Connection and Isolation Panel [NHANES]    Frequency of Communication with Friends and Family: Three times a week    Frequency of Social Gatherings with Friends and Family: Three times a week    Attends Religious Services: Never    Active Member of Clubs or Organizations: No    Attends Archivist Meetings:  Never    Marital Status: Widowed     Family History: The patient's family history includes Asthma in  his brother, father, and sister; Breast cancer in his sister; Emphysema in his father; Liver cancer in his sister.  ROS:   Please see the history of present illness.     All other systems reviewed and are negative.  EKGs/Labs/Other Studies Reviewed:    The following studies were reviewed today:  Left heart cath 03/25/22:   Dist LAD lesion is 10% stenosed.   1st Mrg lesion is 30% stenosed.   1.  Diffuse mild to moderate obstructive disease with patent mid LAD stent. 2.  Elevated LVEDP of 25 mmHg.   Commendation: Continued medical therapy.     Echo 03/26/22:  1. Study not well visualized to full assess endocardial borders. Left  ventricular ejection fraction, by estimation, is 55 to 60%. The left  ventricle has normal function. The left ventricle has no regional wall  motion abnormalities. There is mild left  ventricular hypertrophy. Left ventricular diastolic parameters were  normal.   2. Right ventricular systolic function is normal. The right ventricular  size is normal. There is normal pulmonary artery systolic pressure.   3. The mitral valve is normal in structure. No evidence of mitral valve  regurgitation.   4. The aortic valve was not well visualized. Aortic valve regurgitation  is not visualized.   5. The inferior vena cava is normal in size with greater than 50%  respiratory variability, suggesting right atrial pressure of 3 mmHg.      TEE 03/24/22: See external report  EKG:  EKG is not ordered today.    Recent Labs: 02/09/2022: TSH 2.44 03/24/2022: ALT 22 04/01/2022: Magnesium 2.2 04/06/2022: B Natriuretic Peptide 48.0; BUN 24; Creatinine, Ser 1.03; Hemoglobin 9.6; Platelets 408; Potassium 4.4; Sodium 137  Recent Lipid Panel    Component Value Date/Time   CHOL 126 08/10/2021 1520   CHOL 151 12/17/2020 1017   TRIG 823 (H) 03/25/2022 0517   HDL 37.70 (L) 08/10/2021 1520   HDL 40 12/17/2020 1017   CHOLHDL 3 08/10/2021 1520   VLDL 42.8 (H) 08/10/2021 1520    LDLCALC 89 12/17/2020 1017   LDLDIRECT 62.0 08/10/2021 1520     Risk Assessment/Calculations:               Physical Exam:    VS:  BP 132/66   Pulse 80   Ht '5\' 6"'$  (1.676 m)   Wt 237 lb 6.4 oz (107.7 kg)   SpO2 96%   BMI 38.32 kg/m     Wt Readings from Last 3 Encounters:  04/11/22 237 lb 6.4 oz (107.7 kg)  04/06/22 234 lb 12.8 oz (106.5 kg)  04/04/22 233 lb 9.6 oz (106 kg)     GEN:  Well nourished, well developed in no acute distress HEENT: Normal NECK: No JVD; No carotid bruits LYMPHATICS: No lymphadenopathy CARDIAC: RRR, no murmurs, rubs, gallops - life vest in place RESPIRATORY:  Clear to auscultation without rales, wheezing or rhonchi  ABDOMEN: Soft, non-tender, non-distended MUSCULOSKELETAL:  No edema; No deformity  SKIN: Warm and dry NEUROLOGIC:  Alert and oriented x 3 PSYCHIATRIC:  Normal affect   ASSESSMENT:    1. Cardiac arrest with ventricular fibrillation ([place])   2. Uses LifeVest defibrillator   3. Non-ST elevation (NSTEMI) myocardial infarction ([place])   4. Coronary artery disease involving native coronary artery of native heart without angina pectoris   5. Acute on chronic diastolic CHF (  congestive heart failure) ([place])   6. Primary hypertension   7. Deep vein thrombosis (DVT) of proximal lower extremity, unspecified chronicity, unspecified laterality ([place])   8. GAVE (gastric antral vascular ectasia)    PLAN:    In order of problems listed above:  Vfib arrest - in hospital Lifevest in place - felt secondary to significant anemia - now with lifevest - has received no shocks - In consultation with Dr. Martinique, will continue lifevest for 6 weeks - if no rebleed or arrhythmia, then can discontinue    NSTEMI - demand ischemia CAD s/p LAD PCI Heart cath with nonobstructive disease   Chronic diastolic heart failure Hypertension Diuresed in the hospital Echo with preserved EF and normal RV Was mildly hypervolemic 04/06/22 - added entresto 24-26  mg BID, continue jardiance, 25 mg spironolactone, and 40 mg daily lasix - he was advised to use TED hose - needs BMP and BNP in 1 week - entresto will likely be too expensive for him to continue long-term I advised to check the cost using the coupon card.  If this is cost prohibitive, would then go back to his prior regimen of telmisartan-HCTZ with a likely increase in his standing daily Lasix dose   Hx of DVT/PE Chronic IDA with frequent blood transfusions Previously on xarelto Now with ICV in place       Follow up with Dr. Martinique. Will need to check cost of entresto. He also just got a new bottle of telmisartan-HCTZ.       Medication Adjustments/Labs and Tests Ordered: Current medicines are reviewed at length with the patient today.  Concerns regarding medicines are outlined above.  No orders of the defined types were placed in this encounter.  No orders of the defined types were placed in this encounter.   Patient Instructions  Medication Instructions:  Your physician recommends that you continue on your current medications as directed. Please refer to the Current Medication list given to you today.  *If you need a refill on your cardiac medications before your next appointment, please call your pharmacy*   Lab Work: BMET, CBC, BNP and Magnesium for Monday, October 16/2023. If you have labs (blood work) drawn today and your tests are completely normal, you will receive your results only by: [place] (if you have MyChart) OR A paper copy in the mail If you have any lab test that is abnormal or we need to change your treatment, we will call you to review the results.   Follow-Up: At [facility], you and your health needs are our priority.  As part of our continuing mission to provide you with exceptional heart care, we have created designated Provider Care Teams.  These Care Teams include your primary Cardiologist (physician) and Advanced Practice Providers  (APPs -  Physician Assistants and Nurse Practitioners) who all work together to provide you with the care you need, when you need it.  Your next appointment:   05/16/2022 at 03:20 PM with Dr. Martinique           Signed, Tami Lin [place], [place]  04/11/2022 3:11 PM    [place]

## 2022-04-07 NOTE — Research (Cosign Needed)
[facility]  Ultrasound Validation trial Informed Consent   Subject Name: Aaron [name]  Subject met inclusion and exclusion criteria.  The informed consent form, study requirements and expectations were reviewed with the subject and questions and concerns were addressed prior to the signing of the consent form.  The subject verbalized understanding of the trial requirements.  The subject agreed to participate in the [facility] trial and signed the informed consent at 4:00 pm  on 04/06/2022.  The informed consent was obtained prior to performance of any protocol-specific procedures for the subject.  A copy of the signed informed consent was given to the subject and a copy was placed in the subject's medical record.   Both lung ultrasounds completed.  Berneda Rose

## 2022-04-08 ENCOUNTER — Encounter: Payer: Self-pay | Admitting: *Deleted

## 2022-04-08 ENCOUNTER — Telehealth: Payer: Self-pay | Admitting: *Deleted

## 2022-04-08 NOTE — Patient Outreach (Signed)
  Care Coordination [facility] Note Transition Care Management Unsuccessful Follow-up Telephone Call  Date of discharge and from where:  04/04/22 [facility], V-[facility]  Attempts:  1st Attempt  Reason for unsuccessful TCM follow-up call:  Left voice message: while placing outreach call, noticed from review of EHR that patient is currently engaged with RN CM Care Coordinator, who successfully connected with patient yesterday: will defer further TOC outreaches accordingly  Oneta Rack, RN, BSN, CCRN Alumnus RN CM Care Coordination/ Transition of [place] Management [phone]: direct office

## 2022-04-11 ENCOUNTER — Encounter: Payer: Self-pay | Admitting: Physician Assistant

## 2022-04-11 ENCOUNTER — Ambulatory Visit: Payer: Medicare Other | Attending: Physician Assistant | Admitting: Physician Assistant

## 2022-04-11 ENCOUNTER — Telehealth: Payer: Self-pay

## 2022-04-11 VITALS — BP 132/66 | HR 80 | Ht 66.0 in | Wt 237.4 lb

## 2022-04-11 DIAGNOSIS — I469 Cardiac arrest, cause unspecified: Secondary | ICD-10-CM | POA: Diagnosis not present

## 2022-04-11 DIAGNOSIS — I824Y9 Acute embolism and thrombosis of unspecified deep veins of unspecified proximal lower extremity: Secondary | ICD-10-CM | POA: Diagnosis not present

## 2022-04-11 DIAGNOSIS — Z95810 Presence of automatic (implantable) cardiac defibrillator: Secondary | ICD-10-CM | POA: Diagnosis not present

## 2022-04-11 DIAGNOSIS — K31819 Angiodysplasia of stomach and duodenum without bleeding: Secondary | ICD-10-CM | POA: Diagnosis not present

## 2022-04-11 DIAGNOSIS — I2510 Atherosclerotic heart disease of native coronary artery without angina pectoris: Secondary | ICD-10-CM | POA: Diagnosis not present

## 2022-04-11 DIAGNOSIS — I4901 Ventricular fibrillation: Secondary | ICD-10-CM | POA: Diagnosis not present

## 2022-04-11 DIAGNOSIS — I214 Non-ST elevation (NSTEMI) myocardial infarction: Secondary | ICD-10-CM | POA: Diagnosis not present

## 2022-04-11 DIAGNOSIS — I5033 Acute on chronic diastolic (congestive) heart failure: Secondary | ICD-10-CM

## 2022-04-11 DIAGNOSIS — I10 Essential (primary) hypertension: Secondary | ICD-10-CM

## 2022-04-11 NOTE — Telephone Encounter (Signed)
Verbal orders given to [facility] via secure VM

## 2022-04-11 NOTE — Telephone Encounter (Signed)
Home Health verbal orders  Agency Name: [facility]   Requesting PT  Reason: start of care   Frequency:2w2 1w4  Please forward to [facility] pool or providers CMA

## 2022-04-11 NOTE — Patient Instructions (Addendum)
Medication Instructions:  Your physician recommends that you continue on your current medications as directed. Please refer to the Current Medication list given to you today.  *If you need a refill on your cardiac medications before your next appointment, please call your pharmacy*   Lab Work: BMET, CBC, BNP and Magnesium for Monday, October 16/2023. If you have labs (blood work) drawn today and your tests are completely normal, you will receive your results only by: [place] (if you have MyChart) OR A paper copy in the mail If you have any lab test that is abnormal or we need to change your treatment, we will call you to review the results.   Follow-Up: At [facility], you and your health needs are our priority.  As part of our continuing mission to provide you with exceptional heart care, we have created designated Provider Care Teams.  These Care Teams include your primary Cardiologist (physician) and Advanced Practice Providers (APPs -  Physician Assistants and Nurse Practitioners) who all work together to provide you with the care you need, when you need it.  Your next appointment:   05/16/2022 at 03:20 PM with Dr. Martinique

## 2022-04-12 ENCOUNTER — Telehealth: Payer: Self-pay | Admitting: Physician Assistant

## 2022-04-12 NOTE — Telephone Encounter (Signed)
Paged by answering service.  Patient reporting intermittent right leg pain starting at hip area radiating to his groin.  Worse with bending.  Its been intermittent.  No swelling.  Symptoms does not sound cardiac.  I have recommended to follow-up with his PCP.  He takes tramadol for pain.

## 2022-04-13 ENCOUNTER — Telehealth: Payer: Self-pay | Admitting: Physician Assistant

## 2022-04-13 ENCOUNTER — Other Ambulatory Visit: Payer: Self-pay | Admitting: Internal Medicine

## 2022-04-13 NOTE — Telephone Encounter (Signed)
Patient state he has developed an ache in his right leg from his hip to his knee. He says he called after hours and spoke with Vin who said to take 2 tylenol for the pain. He says the tylenol does not seem to be taking care of the pain completely and he has taken oxycodone in the past.

## 2022-04-13 NOTE — Telephone Encounter (Signed)
Spoke with pt, he continues to have a shooting pain that goes down leg to his groin. He feels it maybe coming from his hip. Advised patient to get in touch with his medical doctor. Patient voiced understanding

## 2022-04-15 DIAGNOSIS — G4733 Obstructive sleep apnea (adult) (pediatric): Secondary | ICD-10-CM | POA: Diagnosis not present

## 2022-04-17 ENCOUNTER — Other Ambulatory Visit: Payer: Self-pay | Admitting: Internal Medicine

## 2022-04-17 DIAGNOSIS — M170 Bilateral primary osteoarthritis of knee: Secondary | ICD-10-CM

## 2022-04-18 DIAGNOSIS — I4901 Ventricular fibrillation: Secondary | ICD-10-CM | POA: Diagnosis not present

## 2022-04-18 DIAGNOSIS — I2510 Atherosclerotic heart disease of native coronary artery without angina pectoris: Secondary | ICD-10-CM | POA: Diagnosis not present

## 2022-04-18 DIAGNOSIS — I469 Cardiac arrest, cause unspecified: Secondary | ICD-10-CM | POA: Diagnosis not present

## 2022-04-18 DIAGNOSIS — Z95810 Presence of automatic (implantable) cardiac defibrillator: Secondary | ICD-10-CM | POA: Diagnosis not present

## 2022-04-18 DIAGNOSIS — I214 Non-ST elevation (NSTEMI) myocardial infarction: Secondary | ICD-10-CM | POA: Diagnosis not present

## 2022-04-19 ENCOUNTER — Encounter: Payer: Self-pay | Admitting: Internal Medicine

## 2022-04-19 ENCOUNTER — Telehealth: Payer: Self-pay | Admitting: Cardiology

## 2022-04-19 ENCOUNTER — Ambulatory Visit (INDEPENDENT_AMBULATORY_CARE_PROVIDER_SITE_OTHER): Payer: Medicare Other | Admitting: Internal Medicine

## 2022-04-19 ENCOUNTER — Ambulatory Visit (INDEPENDENT_AMBULATORY_CARE_PROVIDER_SITE_OTHER): Payer: Medicare Other

## 2022-04-19 VITALS — BP 138/66 | HR 87 | Temp 98.4°F | Ht 66.0 in | Wt 233.0 lb

## 2022-04-19 DIAGNOSIS — K31819 Angiodysplasia of stomach and duodenum without bleeding: Secondary | ICD-10-CM | POA: Diagnosis not present

## 2022-04-19 DIAGNOSIS — D509 Iron deficiency anemia, unspecified: Secondary | ICD-10-CM

## 2022-04-19 DIAGNOSIS — I5021 Acute systolic (congestive) heart failure: Secondary | ICD-10-CM | POA: Diagnosis not present

## 2022-04-19 DIAGNOSIS — R0789 Other chest pain: Secondary | ICD-10-CM | POA: Diagnosis not present

## 2022-04-19 DIAGNOSIS — M170 Bilateral primary osteoarthritis of knee: Secondary | ICD-10-CM | POA: Diagnosis not present

## 2022-04-19 DIAGNOSIS — I130 Hypertensive heart and chronic kidney disease with heart failure and stage 1 through stage 4 chronic kidney disease, or unspecified chronic kidney disease: Secondary | ICD-10-CM | POA: Diagnosis not present

## 2022-04-19 DIAGNOSIS — I5033 Acute on chronic diastolic (congestive) heart failure: Secondary | ICD-10-CM | POA: Diagnosis not present

## 2022-04-19 DIAGNOSIS — E781 Pure hyperglyceridemia: Secondary | ICD-10-CM | POA: Diagnosis not present

## 2022-04-19 DIAGNOSIS — D62 Acute posthemorrhagic anemia: Secondary | ICD-10-CM | POA: Diagnosis not present

## 2022-04-19 MED ORDER — TRAMADOL HCL 50 MG PO TABS: 100.0000 mg | ORAL_TABLET | Freq: Three times a day (TID) | ORAL | 1 refills | Status: DC | PRN

## 2022-04-19 NOTE — Telephone Encounter (Signed)
Spoke to the patient's wife, per the dpr. She has been made aware that once the provider has resulted the labs, someone will call her with them. She has verbalized her understanding.

## 2022-04-19 NOTE — Progress Notes (Addendum)
Subjective:  Patient ID: Aaron [name], male    DOB: Oct 18, 1945  Age: 76 y.o. MRN: [id number]  CC: Anemia   HPI Aaron [name] presents for f/up -  He was recently admitted and received CPR - he complains of persistent right sided chest wall pain. He refused vaccines against flu, COVID, and shingles.  Outpatient Medications Prior to Visit  Medication Sig Dispense Refill   amLODipine (NORVASC) 5 MG tablet TAKE 1 TABLET BY MOUTH EVERYDAY AT BEDTIME (Patient taking differently: Take 5 mg by mouth at bedtime.) 90 tablet 0   aspirin EC 81 MG tablet Take 1 tablet (81 mg total) by mouth daily. Swallow whole. 30 tablet 0   atorvastatin (LIPITOR) 80 MG tablet TAKE 1 TABLET BY MOUTH ONCE  DAILY 100 tablet 0   Carboxymethylcellulose Sodium 1 % GEL Place 1 application into both eyes at bedtime as needed (Dry eye).     clonazePAM (KLONOPIN) 0.5 MG tablet TAKE 1 TABLET BY MOUTH AT BEDTIME. 90 tablet 0   empagliflozin (JARDIANCE) 10 MG TABS tablet Take 1 tablet (10 mg total) by mouth daily. 30 tablet 0   ezetimibe (ZETIA) 10 MG tablet TAKE 1 TABLET BY MOUTH  DAILY (Patient taking differently: Take 10 mg by mouth daily.) 90 tablet 3   ferrous sulfate 325 (65 FE) MG EC tablet TAKE 1 TABLET BY MOUTH DAILY WITH BREAKFAST. SCHEDULE AN APPOINTMENT FOR FURTHER REFILLS (Patient taking differently: Take 325 mg by mouth daily with breakfast.) 60 tablet 3   furosemide (LASIX) 40 MG tablet Take 1 tablet (40 mg total) by mouth daily. 30 tablet 0   hydrALAZINE (APRESOLINE) 25 MG tablet Take 1 tablet (25 mg total) by mouth 3 (three) times daily. 90 tablet 0   pantoprazole (PROTONIX) 40 MG tablet Take 1 tablet (40 mg total) by mouth daily. 90 tablet 1   polyethylene glycol (MIRALAX / GLYCOLAX) 17 g packet Take 17 g by mouth daily. With coffee     sacubitril-valsartan (ENTRESTO) 24-26 MG Take 1 tablet by mouth 2 (two) times daily. 60 tablet 0   spironolactone (ALDACTONE) 25 MG tablet Take 1 tablet (25 mg total) by  mouth daily. 30 tablet 0   tiZANidine (ZANAFLEX) 4 MG tablet Take 4 mg by mouth every 8 (eight) hours as needed for muscle spasms.     traZODone (DESYREL) 50 MG tablet TAKE 1 TABLET BY MOUTH EVERYDAY AT BEDTIME (Patient taking differently: Take 50 mg by mouth at bedtime.) 90 tablet 1   venlafaxine XR (EFFEXOR-XR) 150 MG 24 hr capsule TAKE 1 CAPSULE BY MOUTH DAILY  WITH BREAKFAST 100 capsule 0   traMADol (ULTRAM) 50 MG tablet TAKE 2 TABLETS (100 MG TOTAL) BY MOUTH EVERY 6 (SIX) HOURS AS NEEDED FOR MODERATE PAIN - SEVERE PAIN (Patient taking differently: Take 100 mg by mouth every 6 (six) hours as needed for moderate pain.) 240 tablet 0   No facility-administered medications prior to visit.    ROS Review of Systems  Constitutional:  Negative for chills, diaphoresis, fatigue and fever.  HENT: Negative.  Negative for trouble swallowing.   Respiratory:  Negative for cough, chest tightness, shortness of breath and wheezing.   Cardiovascular:  Positive for chest pain. Negative for palpitations and leg swelling.  Gastrointestinal:  Negative for abdominal pain, constipation, diarrhea, nausea and vomiting.  Genitourinary: Negative.   Musculoskeletal:  Positive for arthralgias.  Skin:  Negative for color change.  Neurological: Negative.  Negative for dizziness and weakness.  Hematological:  Negative  for adenopathy. Does not bruise/bleed easily.  Psychiatric/Behavioral: Negative.      Objective:  BP 138/66 (BP Location: Left Arm, Patient Position: Sitting, Cuff Size: Large)   Pulse 87   Temp 98.4 F (36.9 C) (Oral)   Ht '5\' 6"'$  (1.676 m)   Wt 233 lb (105.7 kg)   SpO2 95%   BMI 37.61 kg/m   BP Readings from Last 3 Encounters:  04/19/22 138/66  04/11/22 132/66  04/06/22 (!) 140/70    Wt Readings from Last 3 Encounters:  04/19/22 233 lb (105.7 kg)  04/11/22 237 lb 6.4 oz (107.7 kg)  04/06/22 234 lb 12.8 oz (106.5 kg)    Physical Exam Vitals reviewed.  Constitutional:      General: He  is not in acute distress.    Appearance: He is not toxic-appearing or diaphoretic.  HENT:     Nose: Nose normal.     Mouth/Throat:     Mouth: Mucous membranes are moist.  Eyes:     General: No scleral icterus.    Conjunctiva/sclera: Conjunctivae normal.  Cardiovascular:     Rate and Rhythm: Normal rate and regular rhythm.     Heart sounds: No murmur heard. Pulmonary:     Effort: Pulmonary effort is normal.     Breath sounds: No stridor. No wheezing, rhonchi or rales.  Chest:     Chest wall: No mass, deformity, swelling, tenderness, crepitus or edema.  Abdominal:     General: Abdomen is protuberant. Bowel sounds are normal. There is no distension.     Palpations: Abdomen is soft. There is no hepatomegaly, splenomegaly or mass.     Tenderness: There is no abdominal tenderness.  Musculoskeletal:        General: Normal range of motion.     Cervical back: Neck supple.     Right lower leg: No edema.     Left lower leg: No edema.  Lymphadenopathy:     Cervical: No cervical adenopathy.  Skin:    General: Skin is warm and dry.  Neurological:     General: No focal deficit present.     Mental Status: He is alert. Mental status is at baseline.     Lab Results  Component Value Date   WBC 6.1 04/18/2022   HGB 10.3 (L) 04/18/2022   HCT 33.4 (L) 04/18/2022   PLT 323 04/18/2022   GLUCOSE 100 (H) 04/18/2022   CHOL 126 08/10/2021   TRIG 823 (H) 03/25/2022   HDL 37.70 (L) 08/10/2021   LDLDIRECT 62.0 08/10/2021   LDLCALC 89 12/17/2020   ALT 22 03/24/2022   AST 31 03/24/2022   NA 139 04/18/2022   K 4.7 04/18/2022   CL 102 04/18/2022   CREATININE 1.07 04/18/2022   BUN 24 04/18/2022   CO2 19 (L) 04/18/2022   TSH 2.44 02/09/2022   PSA 0.59 02/14/2018   INR 1.11 03/25/2016   HGBA1C 4.3 (L) 03/25/2022    \DG Chest 2 View  Result Date: 04/19/2022 CLINICAL DATA:  Chest wall pain.  History of CPR EXAM: CHEST - 2 VIEW COMPARISON:  03/24/2022 FINDINGS: Stable cardiomediastinal  contours. Aortic atherosclerosis. No focal airspace consolidation, pleural effusion, or pneumothorax. No acute bony abnormality is evident. IMPRESSION: No active cardiopulmonary disease. No acute bony abnormality is evident. Consider dedicated rib series to better assess the ribs, if indicated. Electronically Signed   By: Davina Poke D.O.   On: 04/19/2022 14:48   IR IVC FILTER PLMT / S&I Burke Keels GUID/MOD SED  Result  Date: 03/29/2022 INDICATION: 76 year old gentleman with history of prior DVT and pulmonary embolism recently admitted for upper GI bleed. He presents to IR for IVC filter placement. EXAM: Ultrasound-guided access of right internal jugular vein Infrarenal retrievable IVC filter placement MEDICATIONS: None. ANESTHESIA/SEDATION: Moderate (conscious) sedation was employed during this procedure. A total of Versed 1.5 mg and Fentanyl 25 mcg was administered intravenously by the radiology nurse. Total intra-service moderate Sedation Time: 12 minutes. The patient's level of consciousness and vital signs were monitored continuously by radiology nursing throughout the procedure under my direct supervision. FLUOROSCOPY: Radiation Exposure Index (as provided by the fluoroscopic device): 756 mGy Kerma COMPLICATIONS: None immediate. PROCEDURE: Informed written consent was obtained from the patient after a thorough discussion of the procedural risks, benefits and alternatives. All questions were addressed. Maximal Sterile Barrier Technique was utilized including caps, mask, sterile gowns, sterile gloves, sterile drape, hand hygiene and skin antiseptic. A timeout was performed prior to the initiation of the procedure. Patient positioned supine on the angiography table. Right neck prepped and draped in usual sterile fashion. All elements of maximal sterile barrier were utilized including, cap, mask, sterile gown, sterile gloves, large sterile drape, hand scrubbing and 2% Chlorhexidine for skin cleaning. The right  internal jugular vein was evaluated with ultrasound and shown to be patent. A permanent ultrasound image was obtained and placed in the patient's medical record. Using sterile gel and a sterile probe cover, the right internal jugular vein was entered with a 21 ga needle during real time ultrasound guidance. 21 gauge needle exchanged for a transitional dilator set over 0.018 inch guidewire. Transitional dilator set exchanged for 10 fr sheath over 0.035 inch guidewire. Sheath placed to the infrarenal IVC and venogram performed to delineate the position of the renal veins. Retrievable Denali IVC filter deployed in the infrarenal location. Sheath removed and hemostasis achieved with manual compression. IMPRESSION: Retrievable IVC filter placement. PLAN: This IVC filter is potentially retrievable. The patient will be assessed for filter retrieval by Interventional Radiology in approximately 8-12 weeks. Further recommendations regarding filter retrieval, continued surveillance or declaration of device permanence, will be made at that time. Electronically Signed   By: Miachel Roux M.D.   On: 03/29/2022 14:50   ECHOCARDIOGRAM COMPLETE  Result Date: 03/26/2022    ECHOCARDIOGRAM REPORT   Patient Name:   Aaron [name] Date of Exam: 03/26/2022 Medical Rec #:  [id number]      Height:       66.0 in Accession #:    [id number]     Weight:       205.1 lb Date of Birth:  Nov 08, 1945      BSA:          2.021 m Patient Age:    64 years       BP:           139/55 mmHg Patient Gender: M              HR:           79 bpm. Exam Location:  Inpatient Procedure: 2D Echo, Cardiac Doppler and Color Doppler Indications:    Cardiac arrest  History:        Patient has prior history of Echocardiogram examinations, most                 recent 03/24/2022. CAD, PE, Arrythmias:Ventricular Fibrillation                 and Cardiac Arrest; Risk Factors:Hypertension, Sleep  Apnea and                 Former Smoker.  Sonographer:    Greer Pickerel Referring  Phys: [id number] Donato Heinz  Sonographer Comments: Image acquisition challenging due to patient body habitus and Image acquisition challenging due to respiratory motion. Patient was in too much pain from resucitation performed night before to [facility] well. IMPRESSIONS  1. Study not well visualized to full assess endocardial borders. Left ventricular ejection fraction, by estimation, is 55 to 60%. The left ventricle has normal function. The left ventricle has no regional wall motion abnormalities. There is mild left ventricular hypertrophy. Left ventricular diastolic parameters were normal.  2. Right ventricular systolic function is normal. The right ventricular size is normal. There is normal pulmonary artery systolic pressure.  3. The mitral valve is normal in structure. No evidence of mitral valve regurgitation.  4. The aortic valve was not well visualized. Aortic valve regurgitation is not visualized.  5. The inferior vena cava is normal in size with greater than 50% respiratory variability, suggesting right atrial pressure of 3 mmHg. FINDINGS  Left Ventricle: Study not well visualized to full assess endocardial borders. Left ventricular ejection fraction, by estimation, is 55 to 60%. The left ventricle has normal function. The left ventricle has no regional wall motion abnormalities. The left  ventricular internal cavity size was normal in size. There is mild left ventricular hypertrophy. Left ventricular diastolic parameters were normal. Right Ventricle: The right ventricular size is normal. No increase in right ventricular wall thickness. Right ventricular systolic function is normal. There is normal pulmonary artery systolic pressure. The tricuspid regurgitant velocity is 2.34 m/s, and  with an assumed right atrial pressure of 3 mmHg, the estimated right ventricular systolic pressure is 41.7 mmHg. Left Atrium: Left atrial size was normal in size. Right Atrium: Right atrial size was normal in size.  Pericardium: There is no evidence of pericardial effusion. Mitral Valve: The mitral valve is normal in structure. No evidence of mitral valve regurgitation. Tricuspid Valve: The tricuspid valve is normal in structure. Tricuspid valve regurgitation is not demonstrated. Aortic Valve: The aortic valve was not well visualized. Aortic valve regurgitation is not visualized. Aortic valve mean gradient measures 12.0 mmHg. Aortic valve peak gradient measures 24.4 mmHg. Aortic valve area, by VTI measures 1.76 cm. Pulmonic Valve: The pulmonic valve was not well visualized. Pulmonic valve regurgitation is not visualized. Aorta: The aortic root and ascending aorta are structurally normal, with no evidence of dilitation. Venous: The inferior vena cava is normal in size with greater than 50% respiratory variability, suggesting right atrial pressure of 3 mmHg. IAS/Shunts: The interatrial septum was not well visualized.  LEFT VENTRICLE PLAX 2D LVIDd:         5.60 cm      Diastology LVIDs:         4.15 cm      LV e' medial:  8.70 cm/s LV PW:         1.30 cm      LV e' lateral: 14.10 cm/s LV IVS:        1.00 cm LVOT diam:     2.10 cm LV SV:         83 LV SV Index:   41 LVOT Area:     3.46 cm  LV Volumes (MOD) LV vol d, MOD A2C: 162.0 ml LV vol d, MOD A4C: 182.0 ml LV vol s, MOD A2C: 69.8 ml LV vol s, MOD A4C:  79.8 ml LV SV MOD A2C:     92.2 ml LV SV MOD A4C:     182.0 ml LV SV MOD BP:      99.5 ml RIGHT VENTRICLE RV S prime:     15.70 cm/s TAPSE (M-mode): 2.8 cm LEFT ATRIUM            Index        RIGHT ATRIUM           Index LA diam:      4.00 cm  1.98 cm/m   RA Area:     17.50 cm LA Vol (A2C): 57.0 ml  28.20 ml/m  RA Volume:   45.30 ml  22.41 ml/m LA Vol (A4C): 105.0 ml 51.95 ml/m  AORTIC VALVE AV Area (Vmax):    1.88 cm AV Area (Vmean):   1.89 cm AV Area (VTI):     1.76 cm AV Vmax:           247.00 cm/s AV Vmean:          160.000 cm/s AV VTI:            0.470 m AV Peak Grad:      24.4 mmHg AV Mean Grad:      12.0 mmHg  LVOT Vmax:         134.00 cm/s LVOT Vmean:        87.500 cm/s LVOT VTI:          0.239 m LVOT/AV VTI ratio: 0.51  AORTA Ao Root diam: 3.40 cm Ao Asc diam:  3.40 cm MITRAL VALVE               TRICUSPID VALVE MV Area (PHT): 3.42 cm    TR Peak grad:   21.9 mmHg MV Decel Time: 222 msec    TR Vmax:        234.00 cm/s MV A velocity: 80.30 cm/s                            SHUNTS                            Systemic VTI:  0.24 m                            Systemic Diam: 2.10 cm Aaron [name] Electronically signed by Aaron [name] Signature Date/Time: 03/26/2022/1:35:39 PM    Final    CARDIAC CATHETERIZATION  Result Date: 03/25/2022   Dist LAD lesion is 10% stenosed.   1st Mrg lesion is 30% stenosed. 1.  Diffuse mild to moderate obstructive disease with patent mid LAD stent. 2.  Elevated LVEDP of 25 mmHg. Commendation: Continued medical therapy.   DG CHEST PORT 1 VIEW  Result Date: 03/24/2022 CLINICAL DATA:  Shortness of breath post endoscopy EXAM: PORTABLE CHEST 1 VIEW COMPARISON:  Previous studies including the examination done earlier today FINDINGS: Tip of endotracheal tube is 3.8 cm above the carina. There is almost complete opacification of left hemithorax. Central pulmonary vessels are prominent in the right lung. Increased interstitial markings are seen in right mid and right lower lung fields. IMPRESSION: There is interval almost complete opacification of left hemithorax suggesting atelectasis/pneumonia or aspiration. Central pulmonary vessels in right lung appear prominent, possibly suggesting CHF. Increased interstitial markings in right parahilar region and right lower lung field may suggest interstitial edema  or pneumonia. These results will be called to the ordering clinician or representative by the Radiologist Assistant, and communication documented in the PACS or [employer]. Electronically Signed   By: Elmer Picker M.D.   On: 03/24/2022 16:58   CT Head Wo Contrast  Result Date:  03/24/2022 CLINICAL DATA:  Head trauma with ataxia EXAM: CT HEAD WITHOUT CONTRAST CT CERVICAL SPINE WITHOUT CONTRAST TECHNIQUE: Multidetector CT imaging of the head and cervical spine was performed following the standard protocol without intravenous contrast. Multiplanar CT image reconstructions of the cervical spine were also generated. RADIATION DOSE REDUCTION: This exam was performed according to the departmental dose-optimization program which includes automated exposure control, adjustment of the mA and/or kV according to patient size and/or use of iterative reconstruction technique. COMPARISON:  None Available. FINDINGS: CT HEAD FINDINGS Brain: No evidence of acute infarction, hemorrhage, hydrocephalus, extra-axial collection or mass lesion/mass effect. Generalized cerebral volume loss and patchy chronic small vessel ischemia in the hemispheric white matter. Vascular: Atheromatous calcification Skull: No acute fracture Sinuses/Orbits: No evidence of injury. Right enucleation with prosthesis. Left cataract resection. CT CERVICAL SPINE FINDINGS Alignment: Normal Skull base and vertebrae: Chronic marginated lucency through the right C1 lateral mass at the atlantooccipital joint. No acute fracture or aggressive bone lesion. Soft tissues and spinal canal: No prevertebral fluid or swelling. No visible canal hematoma. Disc levels: Multilevel spondylitic spurring. Milder facet spurring with C2-3 ankylosis. Upper chest: No visible injury IMPRESSION: No evidence of acute intracranial or cervical spine injury. Electronically Signed   By: Jorje Guild M.D.   On: 03/24/2022 04:49   CT Cervical Spine Wo Contrast  Result Date: 03/24/2022 CLINICAL DATA:  Head trauma with ataxia EXAM: CT HEAD WITHOUT CONTRAST CT CERVICAL SPINE WITHOUT CONTRAST TECHNIQUE: Multidetector CT imaging of the head and cervical spine was performed following the standard protocol without intravenous contrast. Multiplanar CT image reconstructions  of the cervical spine were also generated. RADIATION DOSE REDUCTION: This exam was performed according to the departmental dose-optimization program which includes automated exposure control, adjustment of the mA and/or kV according to patient size and/or use of iterative reconstruction technique. COMPARISON:  None Available. FINDINGS: CT HEAD FINDINGS Brain: No evidence of acute infarction, hemorrhage, hydrocephalus, extra-axial collection or mass lesion/mass effect. Generalized cerebral volume loss and patchy chronic small vessel ischemia in the hemispheric white matter. Vascular: Atheromatous calcification Skull: No acute fracture Sinuses/Orbits: No evidence of injury. Right enucleation with prosthesis. Left cataract resection. CT CERVICAL SPINE FINDINGS Alignment: Normal Skull base and vertebrae: Chronic marginated lucency through the right C1 lateral mass at the atlantooccipital joint. No acute fracture or aggressive bone lesion. Soft tissues and spinal canal: No prevertebral fluid or swelling. No visible canal hematoma. Disc levels: Multilevel spondylitic spurring. Milder facet spurring with C2-3 ankylosis. Upper chest: No visible injury IMPRESSION: No evidence of acute intracranial or cervical spine injury. Electronically Signed   By: Jorje Guild M.D.   On: 03/24/2022 04:49   DG Elbow 2 Views Right  Result Date: 03/24/2022 CLINICAL DATA:  Level 2 trauma, fall on blood thinners EXAM: RIGHT ELBOW - 2 VIEW COMPARISON:  None Available. FINDINGS: There is no evidence of fracture, dislocation, or joint effusion. There is no evidence of arthropathy or other focal bone abnormality. Soft tissues are unremarkable. IMPRESSION: Negative. Electronically Signed   By: Jorje Guild M.D.   On: 03/24/2022 04:44   DG Chest Portable 1 View  Result Date: 03/24/2022 CLINICAL DATA:  Level 2 fall on blood thinners EXAM: PORTABLE  CHEST 1 VIEW COMPARISON:  10/12/2020 FINDINGS: Hyperinflation. There is no edema,  consolidation, effusion, or pneumothorax. Normal heart size when accounting for mediastinal fat. Stable mediastinal contours. Artifact from EKG leads. IMPRESSION: 1. No acute finding. 2. Hyperinflation. Electronically Signed   By: Jorje Guild M.D.   On: 03/24/2022 04:44     Assessment & Plan:   Traves was seen today for anemia.  Diagnoses and all orders for this visit:  Iron deficiency anemia, unspecified iron deficiency anemia type- Will monitor his H/H and iron. -     IBC + Ferritin; Future -     CBC with Differential/Platelet; Future  Primary osteoarthritis of both knees -     traMADol (ULTRAM) 50 MG tablet; Take 2 tablets (100 mg total) by mouth every 8 (eight) hours as needed for moderate pain.  Primary hypertriglyceridemia -     Triglycerides; Future  Right-sided chest wall pain- Exam and CXR are reassuring. -     DG Chest 2 View; Future   I have changed Aaron [name]'s traMADol. I am also having him maintain his polyethylene glycol, Carboxymethylcellulose Sodium, ezetimibe, ferrous sulfate, amLODipine, tiZANidine, atorvastatin, pantoprazole, clonazePAM, traZODone, aspirin EC, hydrALAZINE, spironolactone, empagliflozin, furosemide, Entresto, and venlafaxine XR.  Meds ordered this encounter  Medications   traMADol (ULTRAM) 50 MG tablet    Sig: Take 2 tablets (100 mg total) by mouth every 8 (eight) hours as needed for moderate pain.    Dispense:  540 tablet    Refill:  1    Not to exceed 5 additional fills before 08/01/2022     Follow-up: Return in about 6 months (around 10/19/2022).  Scarlette Calico, MD

## 2022-04-19 NOTE — Telephone Encounter (Signed)
Patient's wife is calling for [name]' lab results.

## 2022-04-19 NOTE — Patient Instructions (Signed)
Chest Wall Pain Chest wall pain is pain in or around the bones and muscles of your chest. Sometimes, an injury causes this pain. Excessive coughing or overuse of arm and chest muscles may also cause chest wall pain. Sometimes, the cause may not be known. This pain may take several weeks or longer to get better. Follow these instructions at home: Managing pain, stiffness, and swelling  If directed, put ice on the painful area: Put ice in a plastic bag. Place a towel between your skin and the bag. Leave the ice on for 20 minutes, 2-3 times per day. Activity Rest as told by your health care provider. Avoid activities that cause pain. These include any activities that use your chest muscles or your abdominal and side muscles to lift heavy items. Ask your health care provider what activities are safe for you. General instructions  Take over-the-counter and prescription medicines only as told by your health care provider. Do not use any products that contain nicotine or tobacco, such as cigarettes, e-cigarettes, and chewing tobacco. These can delay healing after injury. If you need help quitting, ask your health care provider. Keep all follow-up visits as told by your health care provider. This is important. Contact a health care provider if: You have a fever. Your chest pain becomes worse. You have new symptoms. Get help right away if: You have nausea or vomiting. You feel sweaty or light-headed. You have a cough with mucus from your lungs (sputum) or you cough up blood. You develop shortness of breath. These symptoms may represent a serious problem that is an emergency. Do not wait to see if the symptoms will go away. Get medical help right away. Call your local emergency services (911 in the [place] Do not drive yourself to the hospital. Summary Chest wall pain is pain in or around the bones and muscles of your chest. Depending on the cause, it may be treated with ice, rest, medicines, and  avoiding activities that cause pain. Contact a health care provider if you have a fever, worsening chest pain, or new symptoms. Get help right away if you feel light-headed or you develop shortness of breath. These symptoms may be an emergency. This information is not intended to replace advice given to you by your health care provider. Make sure you discuss any questions you have with your health care provider. Document Revised: 09/04/2020 Document Reviewed: 09/04/2020 Elsevier Patient Education  2023 [employer]  

## 2022-04-20 ENCOUNTER — Ambulatory Visit: Payer: Self-pay

## 2022-04-20 LAB — MAGNESIUM: Magnesium: 2.3 mg/dL (ref 1.6–2.3)

## 2022-04-20 LAB — BASIC METABOLIC PANEL
BUN/Creatinine Ratio: 22 (ref 10–24)
BUN: 24 mg/dL (ref 8–27)
CO2: 19 mmol/L — ABNORMAL LOW (ref 20–29)
Calcium: 9.1 mg/dL (ref 8.6–10.2)
Chloride: 102 mmol/L (ref 96–106)
Creatinine, Ser: 1.07 mg/dL (ref 0.76–1.27)
Glucose: 100 mg/dL — ABNORMAL HIGH (ref 70–99)
Potassium: 4.7 mmol/L (ref 3.5–5.2)
Sodium: 139 mmol/L (ref 134–144)
eGFR: 72 mL/min/{1.73_m2} (ref >59)

## 2022-04-20 LAB — BRAIN NATRIURETIC PEPTIDE: BNP: 42 pg/mL (ref 0.0–100.0)

## 2022-04-20 LAB — CBC
Hematocrit: 33.4 % — ABNORMAL LOW (ref 37.5–51.0)
Hemoglobin: 10.3 g/dL — ABNORMAL LOW (ref 13.0–17.7)
MCH: 26.1 pg — ABNORMAL LOW (ref 26.6–33.0)
MCHC: 30.8 g/dL — ABNORMAL LOW (ref 31.5–35.7)
MCV: 85 fL (ref 79–97)
Platelets: 323 10*3/uL (ref 150–450)
RBC: 3.94 x10E6/uL — ABNORMAL LOW (ref 4.14–5.80)
RDW: 15.7 % — ABNORMAL HIGH (ref 11.6–15.4)
WBC: 6.1 10*3/uL (ref 3.4–10.8)

## 2022-04-20 NOTE — Patient Outreach (Signed)
  Care Coordination   Follow Up Visit Note   04/20/2022 Name: Aaron [name] MRN: [id number] DOB: [birth year]  Aaron [name] is a 76 y.o. year old male who sees Aaron Lima, MD for primary care. I spoke with  Aaron [name] by phone today.  What matters to the patients health and wellness today?  Aaron [name] reports chest wall pain post chest compression discussed at primary care provider on yesterday. He continues to follow up with providers as scheduled and denies any questions or concerns at this time.   Goals Addressed             This Visit's Progress    Care coordination post hospitalization       Care Coordination Interventions: Reviewed after visit summary for primary care visit on 04/19/22 Medications reviewed, encouraged to continue to take medications as prescribed. Encouraged to complete application assistance form for Entresto  Upcoming/scheduled appointments reviewed with patient Encouraged patient to contact provider if condition worsens or does not improve.       SDOH assessments and interventions completed:  No  Care Coordination Interventions Activated:  Yes  Care Coordination Interventions:  Yes, provided   Follow up plan: Follow up call scheduled for 05/30/22    Encounter Outcome:  Pt. Visit Completed   Thea Silversmith, RN, MSN, BSN, [place] Coordinator [phone]

## 2022-04-20 NOTE — Patient Instructions (Signed)
Visit Information  Thank you for taking time to visit with me today. Please don't hesitate to contact me if I can be of assistance to you.   Following are the goals we discussed today:   Goals Addressed             This Visit's Progress    Care coordination post hospitalization       Care Coordination Interventions: Reviewed after visit summary for primary care visit on 04/19/22 Medications reviewed, encouraged to continue to take medications as prescribed. Encouraged to complete application assistance form for Entresto  Upcoming/scheduled appointments reviewed with patient Encouraged patient to contact provider if condition worsens or does not improve.       Our next appointment is by telephone on 05/30/22 at 2:00 pm  Please call the care guide team at [phone] if you need to cancel or reschedule your appointment.   If you are experiencing a Mental Health or [place] or need someone to talk to, please call the Suicide and Crisis Lifeline: 988  Patient verbalizes understanding of instructions and care plan provided today and agrees to view in [place]. Active MyChart status and patient understanding of how to access instructions and care plan via MyChart confirmed with patient.     Thea Silversmith, RN, MSN, BSN, [place] Coordinator [phone]

## 2022-04-21 ENCOUNTER — Ambulatory Visit: Payer: Medicare Other | Admitting: Internal Medicine

## 2022-04-21 ENCOUNTER — Telehealth: Payer: Self-pay

## 2022-04-21 DIAGNOSIS — D509 Iron deficiency anemia, unspecified: Secondary | ICD-10-CM | POA: Diagnosis not present

## 2022-04-21 DIAGNOSIS — I5033 Acute on chronic diastolic (congestive) heart failure: Secondary | ICD-10-CM | POA: Diagnosis not present

## 2022-04-21 DIAGNOSIS — K31819 Angiodysplasia of stomach and duodenum without bleeding: Secondary | ICD-10-CM | POA: Diagnosis not present

## 2022-04-21 DIAGNOSIS — D62 Acute posthemorrhagic anemia: Secondary | ICD-10-CM | POA: Diagnosis not present

## 2022-04-21 DIAGNOSIS — I5021 Acute systolic (congestive) heart failure: Secondary | ICD-10-CM | POA: Diagnosis not present

## 2022-04-21 DIAGNOSIS — I130 Hypertensive heart and chronic kidney disease with heart failure and stage 1 through stage 4 chronic kidney disease, or unspecified chronic kidney disease: Secondary | ICD-10-CM | POA: Diagnosis not present

## 2022-04-21 NOTE — Telephone Encounter (Signed)
Aaron [name] is calling in from [facility], they want to know if they are allowed to use internal parameters when it comes to blood pressure and ext.

## 2022-04-21 NOTE — Telephone Encounter (Signed)
Lvm for Aaron [name] stating that it is ok to use internal parameters for the pt.

## 2022-04-25 ENCOUNTER — Inpatient Hospital Stay: Payer: Medicare Other | Attending: Internal Medicine

## 2022-04-25 ENCOUNTER — Inpatient Hospital Stay (HOSPITAL_BASED_OUTPATIENT_CLINIC_OR_DEPARTMENT_OTHER): Payer: Medicare Other | Admitting: Internal Medicine

## 2022-04-25 ENCOUNTER — Telehealth: Payer: Self-pay | Admitting: Pharmacy Technician

## 2022-04-25 VITALS — BP 137/66 | HR 81 | Temp 98.3°F | Resp 17 | Ht 66.0 in | Wt 231.1 lb

## 2022-04-25 DIAGNOSIS — D509 Iron deficiency anemia, unspecified: Secondary | ICD-10-CM

## 2022-04-25 DIAGNOSIS — Z85828 Personal history of other malignant neoplasm of skin: Secondary | ICD-10-CM | POA: Insufficient documentation

## 2022-04-25 DIAGNOSIS — K31811 Angiodysplasia of stomach and duodenum with bleeding: Secondary | ICD-10-CM | POA: Insufficient documentation

## 2022-04-25 DIAGNOSIS — D500 Iron deficiency anemia secondary to blood loss (chronic): Secondary | ICD-10-CM

## 2022-04-25 DIAGNOSIS — I10 Essential (primary) hypertension: Secondary | ICD-10-CM | POA: Insufficient documentation

## 2022-04-25 LAB — CBC WITH DIFFERENTIAL (CANCER CENTER ONLY)
Abs Immature Granulocytes: 0.03 10*3/uL (ref 0.00–0.07)
Basophils Absolute: 0.1 10*3/uL (ref 0.0–0.1)
Basophils Relative: 1 %
Eosinophils Absolute: 0.4 10*3/uL (ref 0.0–0.5)
Eosinophils Relative: 6 %
HCT: 32.4 % — ABNORMAL LOW (ref 39.0–52.0)
Hemoglobin: 10.2 g/dL — ABNORMAL LOW (ref 13.0–17.0)
Immature Granulocytes: 0 %
Lymphocytes Relative: 17 %
Lymphs Abs: 1.2 10*3/uL (ref 0.7–4.0)
MCH: 26.2 pg (ref 26.0–34.0)
MCHC: 31.5 g/dL (ref 30.0–36.0)
MCV: 83.3 fL (ref 80.0–100.0)
Monocytes Absolute: 0.9 10*3/uL (ref 0.1–1.0)
Monocytes Relative: 13 %
Neutro Abs: 4.2 10*3/uL (ref 1.7–7.7)
Neutrophils Relative %: 63 %
Platelet Count: 208 10*3/uL (ref 150–400)
RBC: 3.89 MIL/uL — ABNORMAL LOW (ref 4.22–5.81)
RDW: 16.4 % — ABNORMAL HIGH (ref 11.5–15.5)
WBC Count: 6.9 10*3/uL (ref 4.0–10.5)
nRBC: 0 % (ref 0.0–0.2)

## 2022-04-25 LAB — IRON AND IRON BINDING CAPACITY (CC-WL,HP ONLY)
Iron: 35 ug/dL — ABNORMAL LOW (ref 45–182)
Saturation Ratios: 11 % — ABNORMAL LOW (ref 17.9–39.5)
TIBC: 307 ug/dL (ref 250–450)
UIBC: 272 ug/dL (ref 117–376)

## 2022-04-25 NOTE — Progress Notes (Signed)
[facility] Telephone:([phone]   [phone]  OFFICE PROGRESS NOTE  [name], MD [place] [address]  DIAGNOSIS: Microcytic anemia secondary to iron deficiency secondary to gastrointestinal hemorrhage.  PRIOR THERAPY: Iron infusion with Venofer 300 Mg IV weekly on as-needed basis.  CURRENT THERAPY: Ferrous sulfate 325 mg p.o. daily.  INTERVAL HISTORY: Aaron [name] 76 y.o. male returns to the clinic today for follow-up visit accompanied by his wife.  The patient is feeling much better today with no concerning complaints except for mild fatigue and shortness of breath with exertion.  He was admitted to the hospital on February 21, 2022 with significant fatigue and weakness as well as worsening dyspnea and dizziness.  He was found to have hemoglobin of 4.8 and the patient had V-fib arrest requiring CPR and defibrillation.  He received 3 units of PRBCs transfusion during his admission.  He also had EGD that showed gastric antral vascular ectasia with acute blood loss anemia.  He is here today for evaluation and recommendation regarding treatment of his anemia.  He denied having any current chest pain, cough or hemoptysis.  He has no nausea, vomiting, diarrhea or constipation.  He has no headache or visual changes.  His treatment with Xarelto was discontinued.  He is currently on aspirin.    MEDICAL HISTORY: Past Medical History:  Diagnosis Date   Anemia    "@ birth"   Anxiety    Blood transfusion    "@ birth"   Cancer [facility])    Head - skin cancer   Constipation due to pain medication    Coronary artery disease    DES LAD 2005   Depression    Full dentures    GERD (gastroesophageal reflux disease)    Headache(784.0)    "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; 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"   Heart murmur    Hyperlipidemia    Hypertension    Insomnia    Osteoarthritis    PONV (postoperative nausea and vomiting)    2006 - knee surgery    Pre-diabetes    Psoriasis    PTSD (post-traumatic stress disorder)    Pulmonary embolism ([place]) ~ 03/2006   bilaterally   Seasonal allergies    Shortness of breath 06/08/11   "w/exertion; that's why I'm here"   Sleep apnea, obstructive    Umbilical hernia     ALLERGIES:  is allergic to wellbutrin [bupropion] and amoxicillin.  MEDICATIONS:  Current Outpatient Medications  Medication Sig Dispense Refill   amLODipine (NORVASC) 5 MG tablet TAKE 1 TABLET BY MOUTH EVERYDAY AT BEDTIME (Patient taking differently: Take 5 mg by mouth at bedtime.) 90 tablet 0   aspirin EC 81 MG tablet Take 1 tablet (81 mg total) by mouth daily. Swallow whole. 30 tablet 0   atorvastatin (LIPITOR) 80 MG tablet TAKE 1 TABLET BY MOUTH ONCE  DAILY 100 tablet 0   Carboxymethylcellulose Sodium 1 % GEL Place 1 application into both eyes at bedtime as needed (Dry eye).     clonazePAM (KLONOPIN) 0.5 MG tablet TAKE 1 TABLET BY MOUTH AT BEDTIME. 90 tablet 0   empagliflozin (JARDIANCE) 10 MG TABS tablet Take 1 tablet (10 mg total) by mouth daily. 30 tablet 0   ezetimibe (ZETIA) 10 MG tablet TAKE 1 TABLET BY MOUTH  DAILY (Patient taking differently: Take 10 mg by mouth daily.) 90 tablet 3   ferrous sulfate 325 (65 FE) MG EC tablet TAKE  1 TABLET BY MOUTH DAILY WITH BREAKFAST. SCHEDULE AN APPOINTMENT FOR FURTHER REFILLS (Patient taking differently: Take 325 mg by mouth daily with breakfast.) 60 tablet 3   furosemide (LASIX) 40 MG tablet Take 1 tablet (40 mg total) by mouth daily. 30 tablet 0   hydrALAZINE (APRESOLINE) 25 MG tablet Take 1 tablet (25 mg total) by mouth 3 (three) times daily. 90 tablet 0   pantoprazole (PROTONIX) 40 MG tablet Take 1 tablet (40 mg total) by mouth daily. 90 tablet 1   polyethylene glycol (MIRALAX / GLYCOLAX) 17 g packet Take 17 g by mouth daily. With coffee     sacubitril-valsartan (ENTRESTO) 24-26 MG Take 1 tablet by mouth 2 (two) times daily. 60 tablet 0   spironolactone (ALDACTONE) 25 MG tablet Take  1 tablet (25 mg total) by mouth daily. 30 tablet 0   tiZANidine (ZANAFLEX) 4 MG tablet Take 4 mg by mouth every 8 (eight) hours as needed for muscle spasms.     traMADol (ULTRAM) 50 MG tablet Take 2 tablets (100 mg total) by mouth every 8 (eight) hours as needed for moderate pain. 540 tablet 1   traZODone (DESYREL) 50 MG tablet TAKE 1 TABLET BY MOUTH EVERYDAY AT BEDTIME (Patient taking differently: Take 50 mg by mouth at bedtime.) 90 tablet 1   venlafaxine XR (EFFEXOR-XR) 150 MG 24 hr capsule TAKE 1 CAPSULE BY MOUTH DAILY  WITH BREAKFAST 100 capsule 0   No current facility-administered medications for this visit.    SURGICAL HISTORY:  Past Surgical History:  Procedure Laterality Date   BUBBLE STUDY  03/24/2022   Procedure: BUBBLE STUDY;  Surgeon: Thornton Park, MD;  Location: [facility];  Service: Gastroenterology;;   CARDIAC CATHETERIZATION     carpel tunnel release Bilateral ~ 06/2005/~ 08/2005   right/left   COLONOSCOPY W/ BIOPSIES AND POLYPECTOMY     CORONARY ANGIOPLASTY     CORONARY STENT PLACEMENT  02/13/2004   1   ENUCLEATION Right    age 36   ESOPHAGOGASTRODUODENOSCOPY (EGD) WITH PROPOFOL N/A 03/24/2022   Procedure: ESOPHAGOGASTRODUODENOSCOPY (EGD) WITH PROPOFOL;  Surgeon: Thornton Park, MD;  Location: [facility];  Service: Gastroenterology;  Laterality: N/A;   EYE SURGERY     age 53; "ruptured  right pupil"   EYE SURGERY     age 71   [place]   placed artificial right eye   HEMOSTASIS CONTROL  03/24/2022   Procedure: HEMOSTASIS CONTROL;  Surgeon: Thornton Park, MD;  Location: [place];  Service: Gastroenterology;;   HOT HEMOSTASIS N/A 03/24/2022   Procedure: HOT HEMOSTASIS (ARGON PLASMA COAGULATION/BICAP);  Surgeon: Thornton Park, MD;  Location: [facility] [place];  Service: Gastroenterology;  Laterality: N/A;   INGUINAL HERNIA REPAIR Right 1983   INGUINAL HERNIA REPAIR Left 1985   IR IVC FILTER PLMT / S&I /IMG GUID/MOD SED  03/29/2022   KNEE  ARTHROSCOPY  ~ 06/2005   right   LEFT HEART CATH AND CORONARY ANGIOGRAPHY N/A 03/25/2022   Procedure: LEFT HEART CATH AND CORONARY ANGIOGRAPHY;  Surgeon: Early Osmond, MD;  Location: [facility];  Service: Cardiovascular;  Laterality: N/A;   MULTIPLE TOOTH EXTRACTIONS     spinal injections  2006-2007   "maybe 10 injections for herniated discs"   TEE WITHOUT CARDIOVERSION  03/24/2022   Procedure: TRANSESOPHAGEAL ECHOCARDIOGRAM (TEE);  Surgeon: Thornton Park, MD;  Location: [facility];  Service: Gastroenterology;;   TONSILLECTOMY     "when I was real young"   TOTAL KNEE ARTHROPLASTY Left 10/09/2015  Procedure: TOTAL KNEE ARTHROPLASTY;  Surgeon: Dorna Leitz, MD;  Location: [facility];  Service: Orthopedics;  Laterality: Left;   TOTAL KNEE ARTHROPLASTY Right 04/04/2016   Procedure: TOTAL KNEE ARTHROPLASTY;  Surgeon: Dorna Leitz, MD;  Location: [facility] [place];  Service: Orthopedics;  Laterality: Right;    REVIEW OF SYSTEMS:  A comprehensive review of systems was negative except for: Constitutional: positive for fatigue Respiratory: positive for dyspnea on exertion   PHYSICAL EXAMINATION: General appearance: alert, cooperative, fatigued, and no distress Head: Normocephalic, without obvious abnormality, atraumatic Neck: no adenopathy, no JVD, supple, symmetrical, trachea midline, and thyroid not enlarged, symmetric, no tenderness/mass/nodules Lymph nodes: Cervical, supraclavicular, and axillary nodes normal. Resp: clear to auscultation bilaterally Back: symmetric, no curvature. ROM normal. No CVA tenderness. Cardio: regular rate and rhythm, S1, S2 normal, no murmur, click, rub or gallop GI: soft, non-tender; bowel sounds normal; no masses,  no organomegaly Extremities: extremities normal, atraumatic, no cyanosis or edema  ECOG PERFORMANCE STATUS: 1 - Symptomatic but completely ambulatory  Blood pressure 137/66, pulse 81, temperature 98.3 F (36.8 C), temperature source Oral, resp. rate 17,  height '5\' 6"'$  (1.676 m), weight 231 lb 1 oz (104.8 kg), SpO2 100 %.  LABORATORY DATA: Lab Results  Component Value Date   WBC 6.9 04/25/2022   HGB 10.2 (L) 04/25/2022   HCT 32.4 (L) 04/25/2022   MCV 83.3 04/25/2022   PLT 208 04/25/2022      Chemistry      Component Value Date/Time   NA 139 04/18/2022 1509   K 4.7 04/18/2022 1509   CL 102 04/18/2022 1509   CO2 19 (L) 04/18/2022 1509   BUN 24 04/18/2022 1509   CREATININE 1.07 04/18/2022 1509   CREATININE 1.24 06/15/2020 1306   CREATININE 1.16 02/10/2020 1532      Component Value Date/Time   CALCIUM 9.1 04/18/2022 1509   ALKPHOS 71 03/24/2022 0415   AST 31 03/24/2022 0415   AST 17 06/15/2020 1306   ALT 22 03/24/2022 0415   ALT 12 06/15/2020 1306   BILITOT 0.2 (L) 03/24/2022 0415   BILITOT 0.3 12/17/2020 1017   BILITOT 0.4 06/15/2020 1306       RADIOGRAPHIC STUDIES: DG Chest 2 View  Result Date: 04/19/2022 CLINICAL DATA:  Chest wall pain.  History of CPR EXAM: CHEST - 2 VIEW COMPARISON:  03/24/2022 FINDINGS: Stable cardiomediastinal contours. Aortic atherosclerosis. No focal airspace consolidation, pleural effusion, or pneumothorax. No acute bony abnormality is evident. IMPRESSION: No active cardiopulmonary disease. No acute bony abnormality is evident. Consider dedicated rib series to better assess the ribs, if indicated. Electronically Signed   By: Davina Poke D.O.   On: 04/19/2022 14:48   IR IVC FILTER PLMT / S&I [facility] Keels GUID/MOD SED  Result Date: 03/29/2022 INDICATION: 76 year old gentleman with history of prior DVT and pulmonary embolism recently admitted for upper GI bleed. He presents to IR for IVC filter placement. EXAM: Ultrasound-guided access of right internal jugular vein Infrarenal retrievable IVC filter placement MEDICATIONS: None. ANESTHESIA/SEDATION: Moderate (conscious) sedation was employed during this procedure. A total of Versed 1.5 mg and Fentanyl 25 mcg was administered intravenously by the radiology  nurse. Total intra-service moderate Sedation Time: 12 minutes. The patient's level of consciousness and vital signs were monitored continuously by radiology nursing throughout the procedure under my direct supervision. FLUOROSCOPY: Radiation Exposure Index (as provided by the fluoroscopic device): 025 mGy Kerma COMPLICATIONS: None immediate. PROCEDURE: Informed written consent was obtained from the patient after a thorough discussion of the procedural risks,  benefits and alternatives. All questions were addressed. Maximal Sterile Barrier Technique was utilized including caps, mask, sterile gowns, sterile gloves, sterile drape, hand hygiene and skin antiseptic. A timeout was performed prior to the initiation of the procedure. Patient positioned supine on the angiography table. Right neck prepped and draped in usual sterile fashion. All elements of maximal sterile barrier were utilized including, cap, mask, sterile gown, sterile gloves, large sterile drape, hand scrubbing and 2% Chlorhexidine for skin cleaning. The right internal jugular vein was evaluated with ultrasound and shown to be patent. A permanent ultrasound image was obtained and placed in the patient's medical record. Using sterile gel and a sterile probe cover, the right internal jugular vein was entered with a 21 ga needle during real time ultrasound guidance. 21 gauge needle exchanged for a transitional dilator set over 0.018 inch guidewire. Transitional dilator set exchanged for 10 fr sheath over 0.035 inch guidewire. Sheath placed to the infrarenal IVC and venogram performed to delineate the position of the renal veins. Retrievable Denali IVC filter deployed in the infrarenal location. Sheath removed and hemostasis achieved with manual compression. IMPRESSION: Retrievable IVC filter placement. PLAN: This IVC filter is potentially retrievable. The patient will be assessed for filter retrieval by Interventional Radiology in approximately 8-12 weeks.  Further recommendations regarding filter retrieval, continued surveillance or declaration of device permanence, will be made at that time. Electronically Signed   By: Miachel Roux M.D.   On: 03/29/2022 14:50    ASSESSMENT AND PLAN: This is a very pleasant 76 years old white male with history of iron deficiency anemia secondary to chronic gastrointestinal blood loss. The patient is currently on oral iron tablet with ferrous sulfate 325 mg p.o. daily and tolerating this treatment well. The patient was found recently to have significant iron deficiency anemia. He has significant gastric antral vascular ectasia with acute blood loss few weeks ago. The patient received 3 units of PRBCs transfusion during his hospitalization. Repeat CBC today showed hemoglobin of 10.2 and hematocrit 32.4%.  Iron studies showed low serum iron of 35 with iron saturation of 11%. I will arrange for the patient to receive iron infusion with Venofer 300 Mg IV weekly for 3 weeks.  First dose April 29, 2022 at the [facility] infusion center. I will see the patient back for follow-up visit in 2 months for evaluation with repeat CBC, iron study and ferritin. The patient was advised to call immediately if she has any concerning symptoms in the interval. The patient voices understanding of current disease status and treatment options and is in agreement with the current care plan.  All questions were answered. The patient knows to call the clinic with any problems, questions or concerns. We can certainly see the patient much sooner if necessary.  The total time spent in the appointment was 20 minutes.  Disclaimer: This note was dictated with voice recognition software. Similar sounding words can inadvertently be transcribed and may not be corrected upon review.

## 2022-04-25 NOTE — Telephone Encounter (Signed)
Auth Submission: NO AUTH NEEDED Payer: [employer] MEDICARE Medication & CPT/J Code(s) submitted: Venofer (Iron Sucrose) J1756 Route of submission (phone, fax, portal):  Phone # Fax # Auth type: Buy/Bill Units/visits requested: X3 Reference number: [id number] - active insurance Approval from: 04/25/22 to 12/31/223

## 2022-04-25 NOTE — Progress Notes (Signed)
04/26/2022 Aaron [name] [id number] June 05, 1946  Referring provider: [name], MD Primary GI doctor: Dr. Tarri Glenn  ASSESSMENT AND PLAN:   Iron deficiency anemia with recent EGD with GAVE and history of colon polyps over due for colonoscopy Recent hospitalization with hemoglobin 4.8, transfused 3 units. EGD 03/24/2022 showed GAVE that is post APC and Hemospray Last colonoscopy 2012 recall 5 years, patient is overdue. Most likely iron deficiency anemia is from GAVE, continue Protonix, currently off Xarelto status post IVC filter without any further episodes of bleeding. Getting supportive care with Dr. Earlie Server outpatient most recent hemoglobin stable at 10.3, ferritin improved, iron still low. Planning on getting IV iron with Dr. Julien Nordmann, with follow-up 06/21/2020.  With IDA patient would also benefit from colonoscopy, will plan for at least 3 months out from the V-fib episode with cardiac arrest though likely driven by severe anemia with normal cardiac catheterization. We will schedule follow-up in the office end of December with subsequent colonoscopy/endoscopy in the hospital in January to evaluate further.  Will discuss further with Dr. Tarri Glenn if she feels patient would benefit from repeat EGD in hospital due to [place] or if we can proceed with just the colonoscopy.  Close follow up in the office Treatment plan was discussed with patient, and agreed upon  Ventricular fibrillation ([place]) During recent hospitalization with hemoglobin 4.8 had subsequent V-fib with cardiac arrest post defibrillation with ROSC 03/25/2022 cardiac catheterization mild to moderate obstructive disease within mid LAD stent 03/26/2022 EF 55-60%, normal valves Has since seen neurology, has LifeVest on for 6 weeks total, if patient has no events can take off and resume driving. Has follow-up with cardiology 11/13.  DVT s/p IVC filter Off Xarelto, on bASA  Chronic idiopathic constipation - Increase  fiber/ water intake, decrease caffeine, increase activity level. -Will add on Miralax daily - no obstructive symptoms at this time. -May benefit from 2-day prep of colonoscopy, can evaluate further at [facility] in Dec   Patient Care Team: [name], MD as PCP - General (Internal Medicine) [place], Peter M, MD as PCP - Cardiology (Cardiology) Juanita Craver, MD as Consulting Physician (Gastroenterology) Luretha Rued, RN as [place], Tami Lin, [place] as Physician Assistant (Cardiology)  HISTORY OF PRESENT ILLNESS: 76 y.o. male with a past medical history of anxiety, hypertension, hyperlipidemia, coronary artery disease s/p DES to the LAD 2005, PE/DVTs on Xarelto, IDA, obesity, obstructive sleep apnea, pre-diabetes, psoriasis, GERDand others listed below presents   Hospital follow-up.  Patient arrived to the hospital after a fall with dizziness, dyspnea, found to have hemoglobin 4.8, had subsequent V-fib with cardiac arrest status post defibrillation x1 with ROSC.  Transfused 3 units.   Was having melenic stools at that time. 03/25/2022 cardiac catheterization mild to moderate obstructive disease within mid LAD stent 03/24/2022 EGD showed GAVE s/p APC and spray. Due to increased risk of rebleed, IVC filter placed by IR. Currently off xarelto and only on bASA.  Still wearing life vest for total of at least 6 weeks, has 2 more weeks and will be evaluated at that time.  He states Friday a week ago he was having constipation,took colace, miralax and on the 13th had large volume black stool.  Since then he has had liquid stools.  Today had some formed stools, not all black today, some brown today.  He continues on the pantoprazole once a day.  He has improved energy, no SOB, no CP, has some leg swelling  still. He walks with walker, but taking out garbage, doing ADL's, cooking, etc and doing PT in the house.   CBC yesterday with hematology showed hemoglobin  of 10.2 ( 10.3) (9.6 when left the hospital)  Iron studies showed low serum iron yesterday of 35 (24) with iron saturation of 11% (5.8%)., ferritin 30 (12.9)- patient on iron 325 mg once a day.  Going to be set up for Venofer 300 Mg IV weekly for 3 weeks.   First dose April 29, 2022 [facility]  Had colonoscopy 2006-2008 per patient, then had colonoscopy with Dr. Collene Mares 2012/2013, and was suppose to have repeat 2017 but had both knees replaced so did not have another and was lost to follow up.  History of colon polyps. No family history.  He has had constipation for a long time, worse in the last 1-2 months.  No hematochezia. No AB pain.  No weight loss.   He  reports that he quit smoking about 26 years ago. His smoking use included cigarettes. He has a 37.50 pack-year smoking history. He has never used smokeless tobacco. He reports that he does not drink alcohol and does not use drugs.  Current Medications:   Current Outpatient Medications (Endocrine & Metabolic):    empagliflozin (JARDIANCE) 10 MG TABS tablet, Take 1 tablet (10 mg total) by mouth daily.  Current Outpatient Medications (Cardiovascular):    amLODipine (NORVASC) 5 MG tablet, TAKE 1 TABLET BY MOUTH EVERYDAY AT BEDTIME (Patient taking differently: Take 5 mg by mouth at bedtime.)   atorvastatin (LIPITOR) 80 MG tablet, TAKE 1 TABLET BY MOUTH ONCE  DAILY   ezetimibe (ZETIA) 10 MG tablet, TAKE 1 TABLET BY MOUTH  DAILY (Patient taking differently: Take 10 mg by mouth daily.)   furosemide (LASIX) 40 MG tablet, Take 1 tablet (40 mg total) by mouth daily.   hydrALAZINE (APRESOLINE) 25 MG tablet, Take 1 tablet (25 mg total) by mouth 3 (three) times daily.   sacubitril-valsartan (ENTRESTO) 24-26 MG, Take 1 tablet by mouth 2 (two) times daily.   spironolactone (ALDACTONE) 25 MG tablet, Take 1 tablet (25 mg total) by mouth daily.   Current Outpatient Medications (Analgesics):    aspirin EC 81 MG tablet, Take 1  tablet (81 mg total) by mouth daily. Swallow whole.   traMADol (ULTRAM) 50 MG tablet, Take 2 tablets (100 mg total) by mouth every 8 (eight) hours as needed for moderate pain.  Current Outpatient Medications (Hematological):    ferrous sulfate 325 (65 FE) MG EC tablet, TAKE 1 TABLET BY MOUTH DAILY WITH BREAKFAST. SCHEDULE AN APPOINTMENT FOR FURTHER REFILLS (Patient taking differently: Take 325 mg by mouth daily with breakfast.)  Current Outpatient Medications (Other):    Carboxymethylcellulose Sodium 1 % GEL, Place 1 application into both eyes at bedtime as needed (Dry eye).   clonazePAM (KLONOPIN) 0.5 MG tablet, TAKE 1 TABLET BY MOUTH AT BEDTIME.   pantoprazole (PROTONIX) 40 MG tablet, Take 1 tablet (40 mg total) by mouth daily.   polyethylene glycol (MIRALAX / GLYCOLAX) 17 g packet, Take 17 g by mouth daily. With coffee   tiZANidine (ZANAFLEX) 4 MG tablet, Take 4 mg by mouth every 8 (eight) hours as needed for muscle spasms.   traZODone (DESYREL) 50 MG tablet, TAKE 1 TABLET BY MOUTH EVERYDAY AT BEDTIME (Patient taking differently: Take 50 mg by mouth at bedtime.)   venlafaxine XR (EFFEXOR-XR) 150 MG 24 hr capsule, TAKE 1 CAPSULE BY MOUTH DAILY  WITH BREAKFAST  Medical History:  Past Medical History:  Diagnosis Date   Anemia    "@ birth"   Anxiety    Blood transfusion    "@ birth"   Cancer [facility])    Head - skin cancer   Constipation due to pain medication    Coronary artery disease    DES LAD 2005   Depression    Full dentures    GERD (gastroesophageal reflux disease)    Headache(784.0)    "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; 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"   Heart murmur    Hyperlipidemia    Hypertension    Insomnia    Osteoarthritis    PONV (postoperative nausea and vomiting)    2006 - knee surgery   Pre-diabetes    Psoriasis    PTSD (post-traumatic stress disorder)    Pulmonary embolism ([place]) ~ 03/2006   bilaterally   Seasonal allergies    Shortness of breath 06/08/11   "w/exertion; that's  why I'm here"   Sleep apnea, obstructive    Umbilical hernia    Allergies:  Allergies  Allergen Reactions   Wellbutrin [Bupropion] Other (See Comments)    Hallucinations, sweating   Amoxicillin Palpitations     Surgical History:  He  has a past surgical history that includes spinal injections (2006-2007); carpel tunnel release (Bilateral, ~ 06/2005/~ 08/2005); Coronary stent placement (02/13/2004); Inguinal hernia repair (Right, 1983); Inguinal hernia repair (Left, 1985); Tonsillectomy; Eye surgery; Eye surgery; Enucleation (Right); Eye surgery [id number]); Cardiac catheterization; Knee arthroscopy (~ 06/2005); Coronary angioplasty; Total knee arthroplasty (Left, 10/09/2015); Colonoscopy w/ biopsies and polypectomy; Multiple tooth extractions; Total knee arthroplasty (Right, 04/04/2016); LEFT HEART CATH AND CORONARY ANGIOGRAPHY (N/A, 03/25/2022); Esophagogastroduodenoscopy (egd) with propofol (N/A, 03/24/2022); TEE without cardioversion (03/24/2022); Hot hemostasis (N/A, 03/24/2022); Hemostasis control (03/24/2022); Bubble study (03/24/2022); and IR IVC FILTER PLMT / S&I /IMG GUID/MOD SED (03/29/2022). Family History:  His family history includes Asthma in his brother, father, and sister; Breast cancer in his sister; Emphysema in his father; Liver cancer in his sister.  REVIEW OF SYSTEMS  : All other systems reviewed and negative except where noted in the History of Present Illness.  PHYSICAL EXAM: BP 120/66   Pulse 88   Ht '5\' 6"'$  (1.676 m)   Wt 233 lb 9.6 oz (106 kg)   SpO2 97%   BMI 37.70 kg/m  General:   Pleasant, obese male in no acute distress Head:   Normocephalic and atraumatic. Eyes:  sclerae anicteric,conjunctive pink  Heart:   regular rate and rhythm, LifeVest in place Pulm:  Clear anteriorly; no wheezing Abdomen:   Soft, Obese AB, Active bowel sounds. No tenderness . , No organomegaly appreciated. Rectal: Not evaluated Extremities:  With 1-2 edema. Msk: Symmetrical without gross  deformities. Peripheral pulses intact.  Walks with walker, unable to get on table. Neurologic:  Alert and  oriented x4;  No focal deficits.  Skin:   Dry and intact without significant lesions or rashes. Psychiatric:  Cooperative. Normal mood and affect.    Vladimir Crofts, PA-C 2:49 PM

## 2022-04-26 ENCOUNTER — Ambulatory Visit: Payer: Medicare Other | Admitting: Internal Medicine

## 2022-04-26 ENCOUNTER — Other Ambulatory Visit: Payer: Medicare Other

## 2022-04-26 ENCOUNTER — Encounter: Payer: Self-pay | Admitting: Physician Assistant

## 2022-04-26 ENCOUNTER — Ambulatory Visit (INDEPENDENT_AMBULATORY_CARE_PROVIDER_SITE_OTHER)
Admission: RE | Admit: 2022-04-26 | Discharge: 2022-04-26 | Disposition: A | Discharge status: Home / Self Care | Payer: Medicare Other | Source: Ambulatory Visit | Attending: Physician Assistant | Admitting: Physician Assistant

## 2022-04-26 ENCOUNTER — Other Ambulatory Visit: Payer: Self-pay | Admitting: Pharmacy Technician

## 2022-04-26 ENCOUNTER — Ambulatory Visit: Payer: Medicare Other | Admitting: Physician Assistant

## 2022-04-26 VITALS — BP 120/66 | HR 88 | Ht 66.0 in | Wt 233.6 lb

## 2022-04-26 DIAGNOSIS — Z86010 Personal history of colonic polyps: Secondary | ICD-10-CM

## 2022-04-26 DIAGNOSIS — I824Y9 Acute embolism and thrombosis of unspecified deep veins of unspecified proximal lower extremity: Secondary | ICD-10-CM

## 2022-04-26 DIAGNOSIS — K5904 Chronic idiopathic constipation: Secondary | ICD-10-CM

## 2022-04-26 DIAGNOSIS — I4901 Ventricular fibrillation: Secondary | ICD-10-CM | POA: Diagnosis not present

## 2022-04-26 DIAGNOSIS — K31819 Angiodysplasia of stomach and duodenum without bleeding: Secondary | ICD-10-CM | POA: Diagnosis not present

## 2022-04-26 DIAGNOSIS — D509 Iron deficiency anemia, unspecified: Secondary | ICD-10-CM | POA: Diagnosis not present

## 2022-04-26 DIAGNOSIS — K5900 Constipation, unspecified: Secondary | ICD-10-CM | POA: Diagnosis not present

## 2022-04-26 LAB — FERRITIN: Ferritin: 30 ng/mL (ref 24–336)

## 2022-04-26 NOTE — Patient Instructions (Addendum)
Your provider has requested that you have an abdominal x ray before leaving today. Please go to the basement floor to our Radiology department for the test.  Please take your proton pump inhibitor medication, pantoprazole- STAY ON THIS DAILY  Please take this medication 30 minutes to 1 hour before meals- this makes it more effective.  Avoid spicy and acidic foods Avoid fatty foods Limit your intake of coffee, tea, alcohol, and carbonated drinks Work to maintain a healthy weight Keep the head of the bed elevated at least 3 inches with blocks or a wedge pillow if you are having any nighttime symptoms Stay upright for 2 hours after eating Avoid meals and snacks three to four hours before bedtime  CONTINUE FOLLOW UP WITH HEMATOLOGY  WILL PLAN FOR EGD/COLON IN [place] UP BEFORE IT  CALL THE OFFICE IF ANY BLACK STOOL, WEAKNESS, AB PAIN or go to the ER  Miralax is an osmotic laxative.  It only brings more water into the stool.  This is safe to take daily.  Can take up to 17 gram of miralax twice a day.  Mix with juice or coffee.  Start 1 capful at night for 3-4 days and reassess your response in 3-4 days.  You can increase and decrease the dose based on your response.  Remember, it can take up to 3-4 days to take effect OR for the effects to wear off.   I often pair this with benefiber in the morning to help assure the stool is not too loose.   - Drink at least 64-80 ounces of water/liquid per day. - Establish a time to try to move your bowels every day.  For many people, this is after a cup of coffee or after a meal such as breakfast. - Sit all of the way back on the toilet keeping your back fairly straight and while sitting up, try to rest the tops of your forearms on your upper thighs.   - Raising your feet with a step stool/squatty potty can be helpful to improve the angle that allows your stool to pass through the rectum. - Relax the rectum feeling it bulge  toward the toilet water.  If you feel your rectum raising toward your body, you are contracting rather than relaxing. - Breathe in and slowly exhale. "Belly breath" by expanding your belly towards your belly button. Keep belly expanded as you gently direct pressure down and back to the anus.  A low pitched GRRR sound can assist with increasing intra-abdominal pressure.  - Repeat 3-4 times. If unsuccessful, contract the pelvic floor to restore normal tone and get off the toilet.  Avoid excessive straining. - To reduce excessive wiping by teaching your anus to normally contract, place hands on outer aspect of knees and resist knee movement outward.  Hold 5-10 second then place hands just inside of knees and resist inward movement of knees.  Hold 5 seconds.  Repeat a few times each way.  Go to the ER if unable to pass gas, severe AB pain, unable to hold down food, any shortness of breath of chest pain.

## 2022-04-30 ENCOUNTER — Other Ambulatory Visit: Payer: Self-pay | Admitting: Internal Medicine

## 2022-04-30 DIAGNOSIS — I10 Essential (primary) hypertension: Secondary | ICD-10-CM

## 2022-05-02 ENCOUNTER — Telehealth: Payer: Self-pay | Admitting: Cardiology

## 2022-05-02 MED ORDER — FUROSEMIDE 40 MG PO TABS: 40.0000 mg | ORAL_TABLET | Freq: Every day | ORAL | 3 refills | Status: DC

## 2022-05-02 MED ORDER — ENTRESTO 24-26 MG PO TABS: 1.0000 | ORAL_TABLET | Freq: Two times a day (BID) | ORAL | 3 refills | Status: DC

## 2022-05-02 MED ORDER — EMPAGLIFLOZIN 10 MG PO TABS: 10.0000 mg | ORAL_TABLET | Freq: Every day | ORAL | 3 refills | Status: DC

## 2022-05-02 MED ORDER — SPIRONOLACTONE 25 MG PO TABS: 25.0000 mg | ORAL_TABLET | Freq: Every day | ORAL | 3 refills | Status: DC

## 2022-05-02 MED ORDER — HYDRALAZINE HCL 25 MG PO TABS: 25.0000 mg | ORAL_TABLET | Freq: Three times a day (TID) | ORAL | 3 refills | Status: DC

## 2022-05-02 NOTE — Telephone Encounter (Signed)
Spoke with patient who asked about refills for medications: Entresto 24/26 twice daily. He is in the process of applying for assistance. Sample bottle set aside for him. Jardiance '10mg'$  daily Spironolactone '25mg'$  daily Lasix '40mg'$  daily. Hydralazine '25mg'$  three times daily.  Refills sent.  Patient sated he has not felt any shocks from the life vest.  Patient also stated he is scheduled to have Fe infusions on 10/31, 11/7, and 11/14. He wanted to be sure it was safe for him to have the infusions. Please advise. Lab results in Epic.

## 2022-05-02 NOTE — Telephone Encounter (Signed)
Patient called and would like to talk with Doreene Adas or nurse in regards to his medication

## 2022-05-03 ENCOUNTER — Ambulatory Visit (INDEPENDENT_AMBULATORY_CARE_PROVIDER_SITE_OTHER): Payer: Medicare Other

## 2022-05-03 VITALS — BP 120/68 | HR 71 | Temp 98.4°F | Resp 18 | Ht 66.0 in | Wt 231.6 lb

## 2022-05-03 DIAGNOSIS — D509 Iron deficiency anemia, unspecified: Secondary | ICD-10-CM | POA: Diagnosis not present

## 2022-05-03 MED ORDER — ACETAMINOPHEN 325 MG PO TABS
650.0000 mg | ORAL_TABLET | Freq: Once | ORAL | Status: AC
  Administered 2022-05-03: 650 mg via ORAL
  Filled 2022-05-03: qty 2

## 2022-05-03 MED ORDER — SODIUM CHLORIDE 0.9 % IV SOLN
300.0000 mg | INTRAVENOUS | Status: DC
  Administered 2022-05-03: 300 mg via INTRAVENOUS
  Filled 2022-05-03: qty 15

## 2022-05-03 MED ORDER — DIPHENHYDRAMINE HCL 25 MG PO CAPS
25.0000 mg | ORAL_CAPSULE | Freq: Once | ORAL | Status: AC
  Administered 2022-05-03: 25 mg via ORAL
  Filled 2022-05-03: qty 1

## 2022-05-03 NOTE — Progress Notes (Signed)
Diagnosis: Iron Deficiency Anemia  Provider:  Marshell Garfinkel MD  Procedure: Infusion  IV Type: Peripheral, IV Location: L Antecubital  Venofer (Iron Sucrose), Dose: 300 mg  Infusion Start Time: 9249  Infusion Stop Time: 3241  Post Infusion IV Care: Peripheral IV Discontinued  Discharge: Condition: Good, Destination: Home . AVS provided to patient.   Performed by:  Adelina Mings, LPN

## 2022-05-04 ENCOUNTER — Telehealth: Payer: Self-pay | Admitting: Internal Medicine

## 2022-05-04 DIAGNOSIS — I130 Hypertensive heart and chronic kidney disease with heart failure and stage 1 through stage 4 chronic kidney disease, or unspecified chronic kidney disease: Secondary | ICD-10-CM | POA: Diagnosis not present

## 2022-05-04 DIAGNOSIS — I5033 Acute on chronic diastolic (congestive) heart failure: Secondary | ICD-10-CM | POA: Diagnosis not present

## 2022-05-04 DIAGNOSIS — D509 Iron deficiency anemia, unspecified: Secondary | ICD-10-CM | POA: Diagnosis not present

## 2022-05-04 DIAGNOSIS — I5021 Acute systolic (congestive) heart failure: Secondary | ICD-10-CM | POA: Diagnosis not present

## 2022-05-04 DIAGNOSIS — K31819 Angiodysplasia of stomach and duodenum without bleeding: Secondary | ICD-10-CM | POA: Diagnosis not present

## 2022-05-04 DIAGNOSIS — D62 Acute posthemorrhagic anemia: Secondary | ICD-10-CM | POA: Diagnosis not present

## 2022-05-04 NOTE — Telephone Encounter (Signed)
Left message on patient's personal voicemail that he may have iron infusions and to call our clinic with any other questions or concerns.

## 2022-05-04 NOTE — Telephone Encounter (Signed)
[facility] called and stated that they just wanted Dr. Ronnald Ramp to be aware that Aaron [name] has requested to be discharged from seeing the Physical Therapist.Patient will no longer receive therapy.

## 2022-05-05 DIAGNOSIS — I469 Cardiac arrest, cause unspecified: Secondary | ICD-10-CM | POA: Diagnosis not present

## 2022-05-05 DIAGNOSIS — I4901 Ventricular fibrillation: Secondary | ICD-10-CM | POA: Diagnosis not present

## 2022-05-10 ENCOUNTER — Ambulatory Visit (INDEPENDENT_AMBULATORY_CARE_PROVIDER_SITE_OTHER): Payer: Medicare Other

## 2022-05-10 VITALS — BP 128/63 | HR 67 | Temp 98.1°F | Resp 18 | Ht 66.0 in | Wt 233.6 lb

## 2022-05-10 DIAGNOSIS — D509 Iron deficiency anemia, unspecified: Secondary | ICD-10-CM | POA: Diagnosis not present

## 2022-05-10 MED ORDER — DIPHENHYDRAMINE HCL 25 MG PO CAPS
25.0000 mg | ORAL_CAPSULE | Freq: Once | ORAL | Status: AC
  Administered 2022-05-10: 25 mg via ORAL
  Filled 2022-05-10: qty 1

## 2022-05-10 MED ORDER — SODIUM CHLORIDE 0.9 % IV SOLN
300.0000 mg | INTRAVENOUS | Status: DC
  Administered 2022-05-10: 300 mg via INTRAVENOUS
  Filled 2022-05-10: qty 15

## 2022-05-10 MED ORDER — ACETAMINOPHEN 325 MG PO TABS
650.0000 mg | ORAL_TABLET | Freq: Once | ORAL | Status: AC
  Administered 2022-05-10: 650 mg via ORAL
  Filled 2022-05-10: qty 2

## 2022-05-10 NOTE — Progress Notes (Signed)
Cardiology Office Note:    Date:  05/16/2022   ID:  Aaron [name], DOB 02-May-1946, MRN [id number]  PCP:  [name], MD   [facility] Providers Cardiologist:  Cristofher Livecchi Martinique, MD Cardiology APP:  Ledora Bottcher, [place]     Referring MD: [name], MD   No chief complaint on file.   History of Present Illness:    Aaron [name] is a 76 y.o. male with a hx of hypertension, coronary artery disease, recurrent PE/DVTs on xarelto, GERD, hyperlipidemia, and obesity.  He has a history of stenting to his mid LAD in 2005 following a nuclear stress test in that showed evidence of anterior apical ischemia.  Follow-up Myoview in 2014 was normal.  He was anticoagulated on Xarelto for recurrent PE/DVT, but with a history of chronic IDA requiring transfusions.  Hypertension was managed by his PCP. He was recently hospitalized 03/2022 with an in-hospital Vfib arrest in the setting of GI bleed and Hb of 4.8. Initial EKG with ST depressions. LHC revealed nonobstructive CAD, no intervention. Telemetry with sinus rhythm. Suspected demand  ischemia in the setting of severe anemia. However, due to risk of re-bleed, he was discharged with a lifevest.  GI workup notable for EGD showing gastric antral vascular ectasia treated with APC, felt to be extremely high risk for re-bleed. GI recommended complete discontinuation of [place]. There fore, Xarelto held and IVC filter was placed by IR.   He was seen in [facility] clinic with mild volume overload. Low dose entresto was added along with continuation of jardiance, spiro, and lasix 40 mg. The patient states he never took the [place] or [place] because he couldn't afford it. He  has lost 17 lbs. No swelling. Breathing is fine. No events on Lifevest. Feels very well. No dyspnea. Receiving his 3rd iron infusion this week. Follows with GI and hematology.     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"   Heart murmur    Hyperlipidemia    Hypertension    Insomnia    Osteoarthritis    PONV (postoperative nausea and vomiting)    2006 - knee surgery   Pre-diabetes    Psoriasis    PTSD (post-traumatic stress disorder)    Pulmonary embolism ([place]) ~ 03/2006   bilaterally   Seasonal allergies    Shortness of breath 06/08/11   "w/exertion; that's why I'm here"   Sleep apnea, obstructive    Umbilical hernia     Past Surgical History:  Procedure Laterality Date   BUBBLE STUDY  03/24/2022   Procedure: BUBBLE STUDY;  Surgeon: Thornton Park, MD;  Location: [place];  Service: Gastroenterology;;   CARDIAC CATHETERIZATION     carpel tunnel release Bilateral ~ 06/2005/~ 08/2005   right/left   COLONOSCOPY W/ BIOPSIES AND POLYPECTOMY     CORONARY ANGIOPLASTY     CORONARY STENT PLACEMENT  02/13/2004   1   ENUCLEATION Right    age 47   ESOPHAGOGASTRODUODENOSCOPY (EGD) WITH PROPOFOL N/A 03/24/2022  Procedure: ESOPHAGOGASTRODUODENOSCOPY (EGD) WITH PROPOFOL;  Surgeon: Thornton Park, MD;  Location: [place];  Service: Gastroenterology;  Laterality: N/A;   EYE SURGERY     age 85; "ruptured  right pupil"   EYE SURGERY     age 36   [place]   placed artificial right eye   HEMOSTASIS CONTROL  03/24/2022   Procedure: HEMOSTASIS CONTROL;  Surgeon: Thornton Park, MD;  Location: [place];  Service: Gastroenterology;;   HOT HEMOSTASIS N/A 03/24/2022   Procedure: HOT HEMOSTASIS (ARGON PLASMA COAGULATION/BICAP);  Surgeon: Thornton Park, MD;  Location: [place];  Service: Gastroenterology;  Laterality: N/A;   INGUINAL HERNIA REPAIR Right 1983   INGUINAL HERNIA REPAIR Left 1985   IR IVC FILTER PLMT /  S&I /IMG GUID/MOD SED  03/29/2022   KNEE ARTHROSCOPY  ~ 06/2005   right   LEFT HEART CATH AND CORONARY ANGIOGRAPHY N/A 03/25/2022   Procedure: LEFT HEART CATH AND CORONARY ANGIOGRAPHY;  Surgeon: Early Osmond, MD;  Location: [place] [facility];  Service: Cardiovascular;  Laterality: N/A;   MULTIPLE TOOTH EXTRACTIONS     spinal injections  2006-2007   "maybe 10 injections for herniated discs"   TEE WITHOUT CARDIOVERSION  03/24/2022   Procedure: TRANSESOPHAGEAL ECHOCARDIOGRAM (TEE);  Surgeon: Thornton Park, MD;  Location: [place];  Service: Gastroenterology;;   TONSILLECTOMY     "when I was real young"   TOTAL KNEE ARTHROPLASTY Left 10/09/2015   Procedure: TOTAL KNEE ARTHROPLASTY;  Surgeon: Dorna Leitz, MD;  Location: [place];  Service: Orthopedics;  Laterality: Left;   TOTAL KNEE ARTHROPLASTY Right 04/04/2016   Procedure: TOTAL KNEE ARTHROPLASTY;  Surgeon: Dorna Leitz, MD;  Location: [place];  Service: Orthopedics;  Laterality: Right;    Current Medications: Current Meds  Medication Sig   amLODipine (NORVASC) 5 MG tablet TAKE 1 TABLET BY MOUTH EVERYDAY AT BEDTIME   aspirin EC 81 MG tablet Take 1 tablet (81 mg total) by mouth daily. Swallow whole.   atorvastatin (LIPITOR) 80 MG tablet TAKE 1 TABLET BY MOUTH ONCE  DAILY   clonazePAM (KLONOPIN) 0.5 MG tablet TAKE 1 TABLET BY MOUTH AT BEDTIME.   ezetimibe (ZETIA) 10 MG tablet TAKE 1 TABLET BY MOUTH  DAILY   ferrous sulfate 325 (65 FE) MG EC tablet TAKE 1 TABLET BY MOUTH DAILY WITH BREAKFAST. SCHEDULE AN APPOINTMENT FOR FURTHER REFILLS (Patient taking differently: Take 325 mg by mouth daily with breakfast.)   furosemide (LASIX) 40 MG tablet Take 1 tablet (40 mg total) by mouth daily.   hydrALAZINE (APRESOLINE) 25 MG tablet Take 1 tablet (25 mg total) by mouth 3 (three) times daily.   pantoprazole (PROTONIX) 40 MG tablet Take 1 tablet (40 mg total) by mouth daily.   polyethylene glycol (MIRALAX / GLYCOLAX) 17 g packet Take 17 g by mouth  daily. With coffee   spironolactone (ALDACTONE) 25 MG tablet Take 1 tablet (25 mg total) by mouth daily.   tiZANidine (ZANAFLEX) 4 MG tablet Take 4 mg by mouth every 8 (eight) hours as needed for muscle spasms.   traMADol (ULTRAM) 50 MG tablet Take 2 tablets (100 mg total) by mouth every 8 (eight) hours as needed for moderate pain.   traZODone (DESYREL) 50 MG tablet TAKE 1 TABLET BY MOUTH EVERYDAY AT BEDTIME (Patient taking differently: Take 50 mg by mouth at bedtime.)   venlafaxine XR (EFFEXOR-XR) 150 MG 24 hr capsule TAKE 1 CAPSULE BY MOUTH DAILY  WITH BREAKFAST     Allergies:  Wellbutrin [bupropion] and Amoxicillin   Social History   Socioeconomic History   Marital status: Married    Spouse name: Not on file   Number of children: 3   Years of education: Not on file   Highest education level: Not on file  Occupational History   Occupation: building maintenance    Comment: disabled  Tobacco Use   Smoking status: Former    Packs/day: 1.50    Years: 25.00    Total pack years: 37.50    Types: Cigarettes    Quit date: 07/10/1995    Years since quitting: 26.8   Smokeless tobacco: Never  Vaping Use   Vaping Use: Never used  Substance and Sexual Activity   Alcohol use: No   Drug use: No   Sexual activity: Not on file  Other Topics Concern   Not on file  Social History Narrative   Former Curator, 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   Recent stress, currently undergoing financial difficulties.   Social Determinants of Health   Financial Resource Strain: Low Risk  (10/29/2021)   Overall Financial Resource Strain (CARDIA)    Difficulty of Paying Living Expenses: Not hard at all  Food Insecurity: No Food Insecurity (04/07/2022)   Hunger Vital Sign    Worried About Running Out of Food in the Last Year: Never true    Ran Out of Food in the Last Year: Never true  Transportation Needs: No Transportation Needs (04/07/2022)   PRAPARE - Radiographer, therapeutic (Medical): No    Lack of Transportation (Non-Medical): No  Physical Activity: Insufficiently Active (10/29/2021)   Exercise Vital Sign    Days of Exercise per Week: 3 days    Minutes of Exercise per Session: 30 min  Stress: No Stress Concern Present (10/29/2021)   [place]    Feeling of Stress : Not at all  Social Connections: Socially Isolated (10/29/2021)   Social Connection and Isolation Panel [NHANES]    Frequency of Communication with Friends and Family: Three times a week    Frequency of Social Gatherings with Friends and Family: Three times a week    Attends Religious Services: Never    Active Member of Clubs or Organizations: No    Attends Archivist Meetings: Never    Marital Status: Widowed     Family History: The patient's family history includes Asthma in his brother, father, and sister; Breast cancer in his sister; Emphysema in his father; Liver cancer in his sister. There is no history of Colon cancer or Esophageal cancer.  ROS:   Please see the history of present illness.     All other systems reviewed and are negative.  EKGs/Labs/Other Studies Reviewed:    The following studies were reviewed today:  Left heart cath 03/25/22:   Dist LAD lesion is 10% stenosed.   1st Mrg lesion is 30% stenosed.   1.  Diffuse mild to moderate obstructive disease with patent mid LAD stent. 2.  Elevated LVEDP of 25 mmHg.   Commendation: Continued medical therapy.     Echo 03/26/22:  1. Study not well visualized to full assess endocardial borders. Left  ventricular ejection fraction, by estimation, is 55 to 60%. The left  ventricle has normal function. The left ventricle has no regional wall  motion abnormalities. There is mild left  ventricular hypertrophy. Left ventricular diastolic parameters were  normal.   2. Right ventricular systolic  function is normal. The right ventricular  size  is normal. There is normal pulmonary artery systolic pressure.   3. The mitral valve is normal in structure. No evidence of mitral valve  regurgitation.   4. The aortic valve was not well visualized. Aortic valve regurgitation  is not visualized.   5. The inferior vena cava is normal in size with greater than 50%  respiratory variability, suggesting right atrial pressure of 3 mmHg.      TEE 03/24/22: See external report  EKG:  EKG is not ordered today.    Recent Labs: 02/09/2022: TSH 2.44 03/24/2022: ALT 22 04/18/2022: BNP 42.0; BUN 24; Creatinine, Ser 1.07; Magnesium 2.3; Potassium 4.7; Sodium 139 04/25/2022: Hemoglobin 10.2; Platelet Count 208  Recent Lipid Panel    Component Value Date/Time   CHOL 126 08/10/2021 1520   CHOL 151 12/17/2020 1017   TRIG 823 (H) 03/25/2022 0517   HDL 37.70 (L) 08/10/2021 1520   HDL 40 12/17/2020 1017   CHOLHDL 3 08/10/2021 1520   VLDL 42.8 (H) 08/10/2021 1520   LDLCALC 89 12/17/2020 1017   LDLDIRECT 62.0 08/10/2021 1520     Risk Assessment/Calculations:               Physical Exam:    VS:  BP 130/65 (BP Location: Left Arm, Patient Position: Sitting, Cuff Size: Normal)   Pulse 73   Ht '5\' 6"'$  (1.676 m)   Wt 233 lb (105.7 kg)   SpO2 98%   BMI 37.61 kg/m     Wt Readings from Last 3 Encounters:  05/16/22 233 lb (105.7 kg)  05/10/22 233 lb 9.6 oz (106 kg)  05/03/22 231 lb 9.6 oz (105.1 kg)     GEN:  Well nourished, well developed in no acute distress HEENT: Normal NECK: No JVD; No carotid bruits LYMPHATICS: No lymphadenopathy CARDIAC: RRR, no murmurs, rubs, gallops - life vest in place RESPIRATORY:  Clear to auscultation without rales, wheezing or rhonchi  ABDOMEN: Soft, non-tender, non-distended MUSCULOSKELETAL:  No edema; No deformity  SKIN: Warm and dry NEUROLOGIC:  Alert and oriented x 3 PSYCHIATRIC:  Normal affect   ASSESSMENT:    1. Cardiac arrest with ventricular fibrillation ([place])   2. Coronary artery disease  involving native coronary artery of native heart without angina pectoris   3. Acute on chronic diastolic CHF (congestive heart failure) ([place])   4. Deep vein thrombosis (DVT) of proximal lower extremity, unspecified chronicity, unspecified laterality ([place])   5. GAVE (gastric antral vascular ectasia)     PLAN:    In order of problems listed above:  Vfib arrest - in hospital in setting of profound anemia - no arrhythmia on Lifevest. Can discontinue at this point. OK to drive.  - felt secondary to significant anemia   NSTEMI - demand ischemia- also in setting of profound anemia CAD s/p LAD PCI Heart cath with nonobstructive disease   Chronic diastolic heart failure Hypertension Diuresed in the hospital Echo with preserved EF and normal RV No indication for Entresto and Jardiance at this time.  On amlodipine and aldactone for HTN   Hx of DVT/PE Chronic IDA with frequent blood transfusions Previously on xarelto Now with IV filter in place  Follow up in 6 months.       Medication Adjustments/Labs and Tests Ordered: Current medicines are reviewed at length with the patient today.  Concerns regarding medicines are outlined above.  No orders of the defined types were placed in this encounter.  No orders of  the defined types were placed in this encounter.   There are no Patient Instructions on file for this visit.   Signed, Mark Benecke [place], MD  05/16/2022 4:02 PM    [place]

## 2022-05-10 NOTE — Progress Notes (Signed)
Diagnosis: Iron Deficiency Anemia  Provider:  Marshell Garfinkel MD  Procedure: Infusion  IV Type: Peripheral, IV Location: L Antecubital  Venofer (Iron Sucrose), Dose: 300 mg  Infusion Start Time: 2060  Infusion Stop Time: 1561  Post Infusion IV Care: Peripheral IV Discontinued  Discharge: Condition: Good, Destination: Home . AVS provided to patient.   Performed by:  Arnoldo Morale, RN

## 2022-05-11 ENCOUNTER — Ambulatory Visit: Payer: Medicare Other | Admitting: Cardiology

## 2022-05-12 ENCOUNTER — Ambulatory Visit: Payer: Medicare Other | Admitting: Internal Medicine

## 2022-05-16 ENCOUNTER — Encounter: Payer: Self-pay | Admitting: Cardiology

## 2022-05-16 ENCOUNTER — Ambulatory Visit: Payer: Medicare Other | Attending: Cardiology | Admitting: Cardiology

## 2022-05-16 VITALS — BP 130/65 | HR 73 | Ht 66.0 in | Wt 233.0 lb

## 2022-05-16 DIAGNOSIS — I5033 Acute on chronic diastolic (congestive) heart failure: Secondary | ICD-10-CM

## 2022-05-16 DIAGNOSIS — I2510 Atherosclerotic heart disease of native coronary artery without angina pectoris: Secondary | ICD-10-CM | POA: Diagnosis not present

## 2022-05-16 DIAGNOSIS — K31819 Angiodysplasia of stomach and duodenum without bleeding: Secondary | ICD-10-CM

## 2022-05-16 DIAGNOSIS — I4901 Ventricular fibrillation: Secondary | ICD-10-CM | POA: Diagnosis not present

## 2022-05-16 DIAGNOSIS — I824Y9 Acute embolism and thrombosis of unspecified deep veins of unspecified proximal lower extremity: Secondary | ICD-10-CM

## 2022-05-16 DIAGNOSIS — I469 Cardiac arrest, cause unspecified: Secondary | ICD-10-CM | POA: Diagnosis not present

## 2022-05-17 ENCOUNTER — Ambulatory Visit (INDEPENDENT_AMBULATORY_CARE_PROVIDER_SITE_OTHER): Payer: Medicare Other

## 2022-05-17 VITALS — BP 133/68 | HR 73 | Temp 98.1°F | Resp 20 | Ht 66.0 in | Wt 231.4 lb

## 2022-05-17 DIAGNOSIS — D509 Iron deficiency anemia, unspecified: Secondary | ICD-10-CM

## 2022-05-17 MED ORDER — DIPHENHYDRAMINE HCL 25 MG PO CAPS
25.0000 mg | ORAL_CAPSULE | Freq: Once | ORAL | Status: AC
  Administered 2022-05-17: 25 mg via ORAL
  Filled 2022-05-17: qty 1

## 2022-05-17 MED ORDER — SODIUM CHLORIDE 0.9 % IV SOLN
300.0000 mg | INTRAVENOUS | Status: DC
  Administered 2022-05-17: 300 mg via INTRAVENOUS
  Filled 2022-05-17: qty 15

## 2022-05-17 MED ORDER — ACETAMINOPHEN 325 MG PO TABS
650.0000 mg | ORAL_TABLET | Freq: Once | ORAL | Status: AC
  Administered 2022-05-17: 650 mg via ORAL
  Filled 2022-05-17: qty 2

## 2022-05-17 NOTE — Progress Notes (Signed)
Diagnosis: Iron Deficiency Anemia  Provider:  Marshell Garfinkel MD  Procedure: Infusion  IV Type: Peripheral, IV Location: L Antecubital  Venofer (Iron Sucrose), Dose: 300 mg  Infusion Start Time: 5339  Infusion Stop Time: 1792  Post Infusion IV Care: Peripheral IV Discontinued  Discharge: Condition: Good, Destination: Home . AVS provided to patient.   Performed by:  Arnoldo Morale, RN

## 2022-05-23 ENCOUNTER — Other Ambulatory Visit: Payer: Self-pay | Admitting: Internal Medicine

## 2022-05-23 DIAGNOSIS — I2510 Atherosclerotic heart disease of native coronary artery without angina pectoris: Secondary | ICD-10-CM

## 2022-05-23 DIAGNOSIS — E785 Hyperlipidemia, unspecified: Secondary | ICD-10-CM

## 2022-05-29 IMAGING — DX DG CHEST 2V
2 series · 3 of 3 positions shown · non-contrast
Comparison: 09/25/2015

CLINICAL DATA: Chronic cough

EXAM:
CHEST - 2 VIEW

[Series 1: chest pa · 0.14mm/px · 2 of 2 slices shown]
[im 1/2]
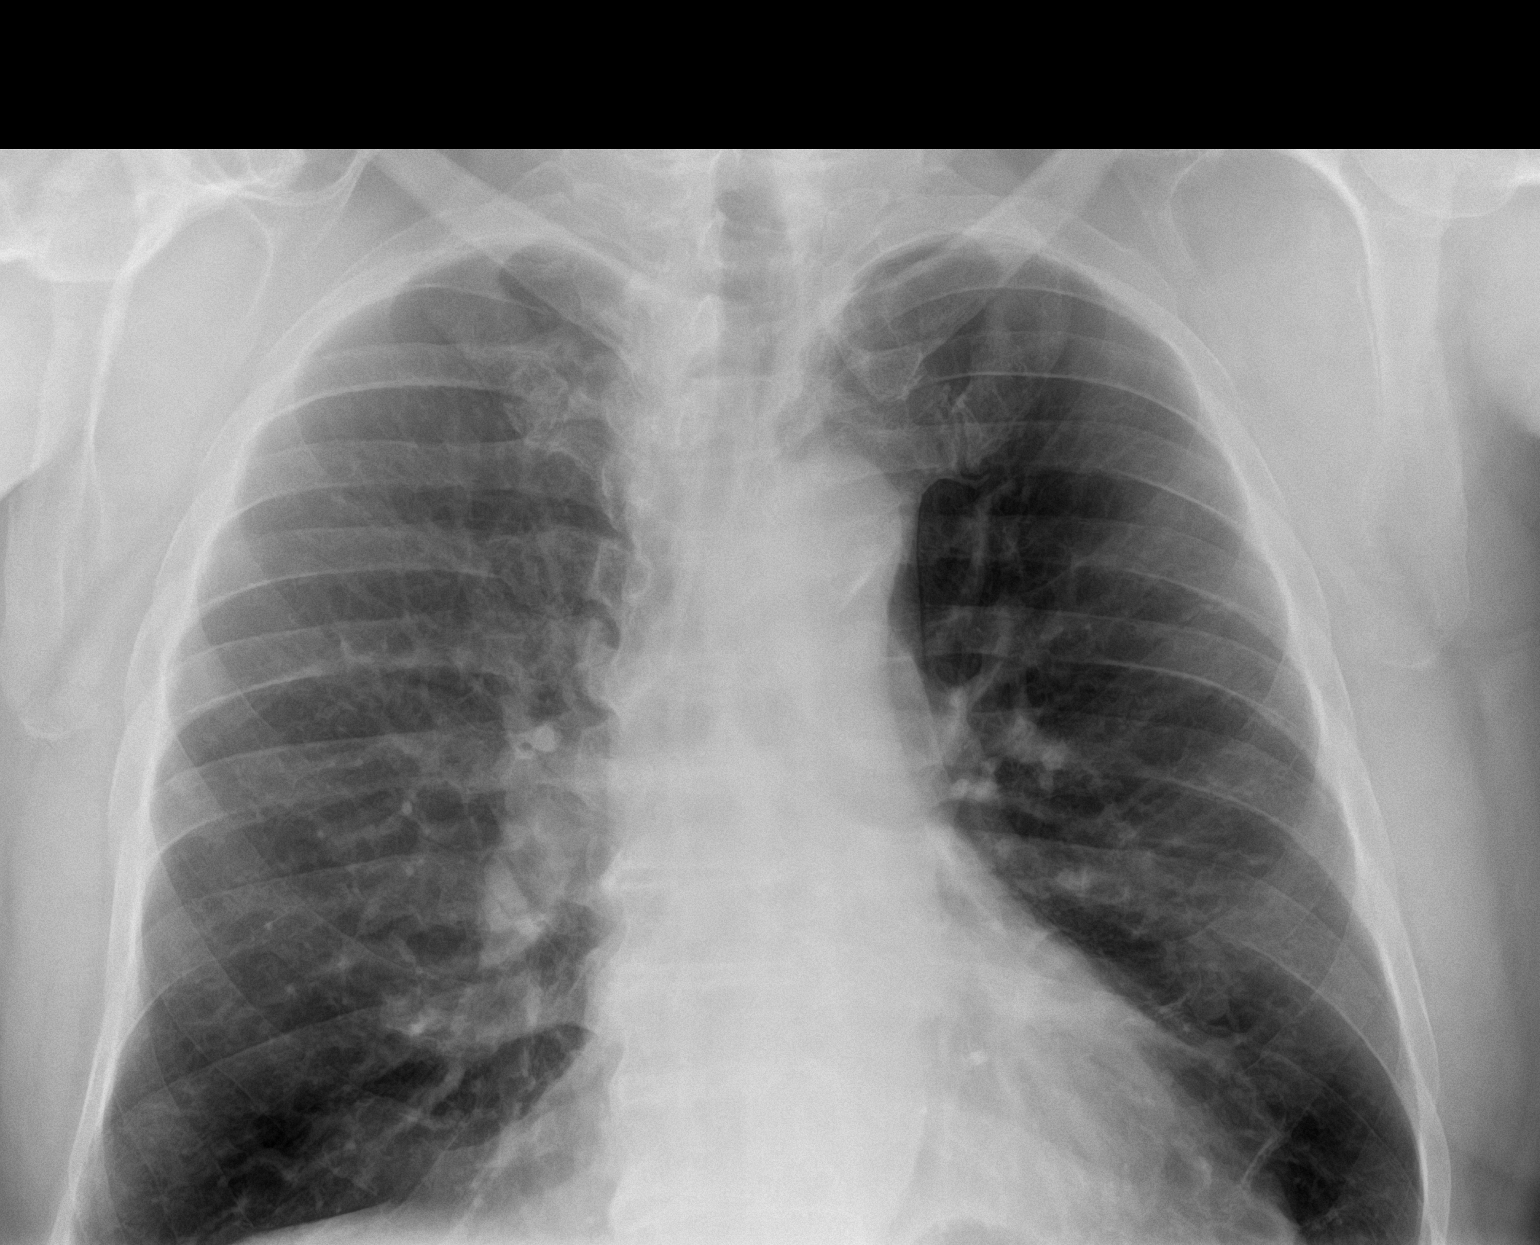
[im 2/2]
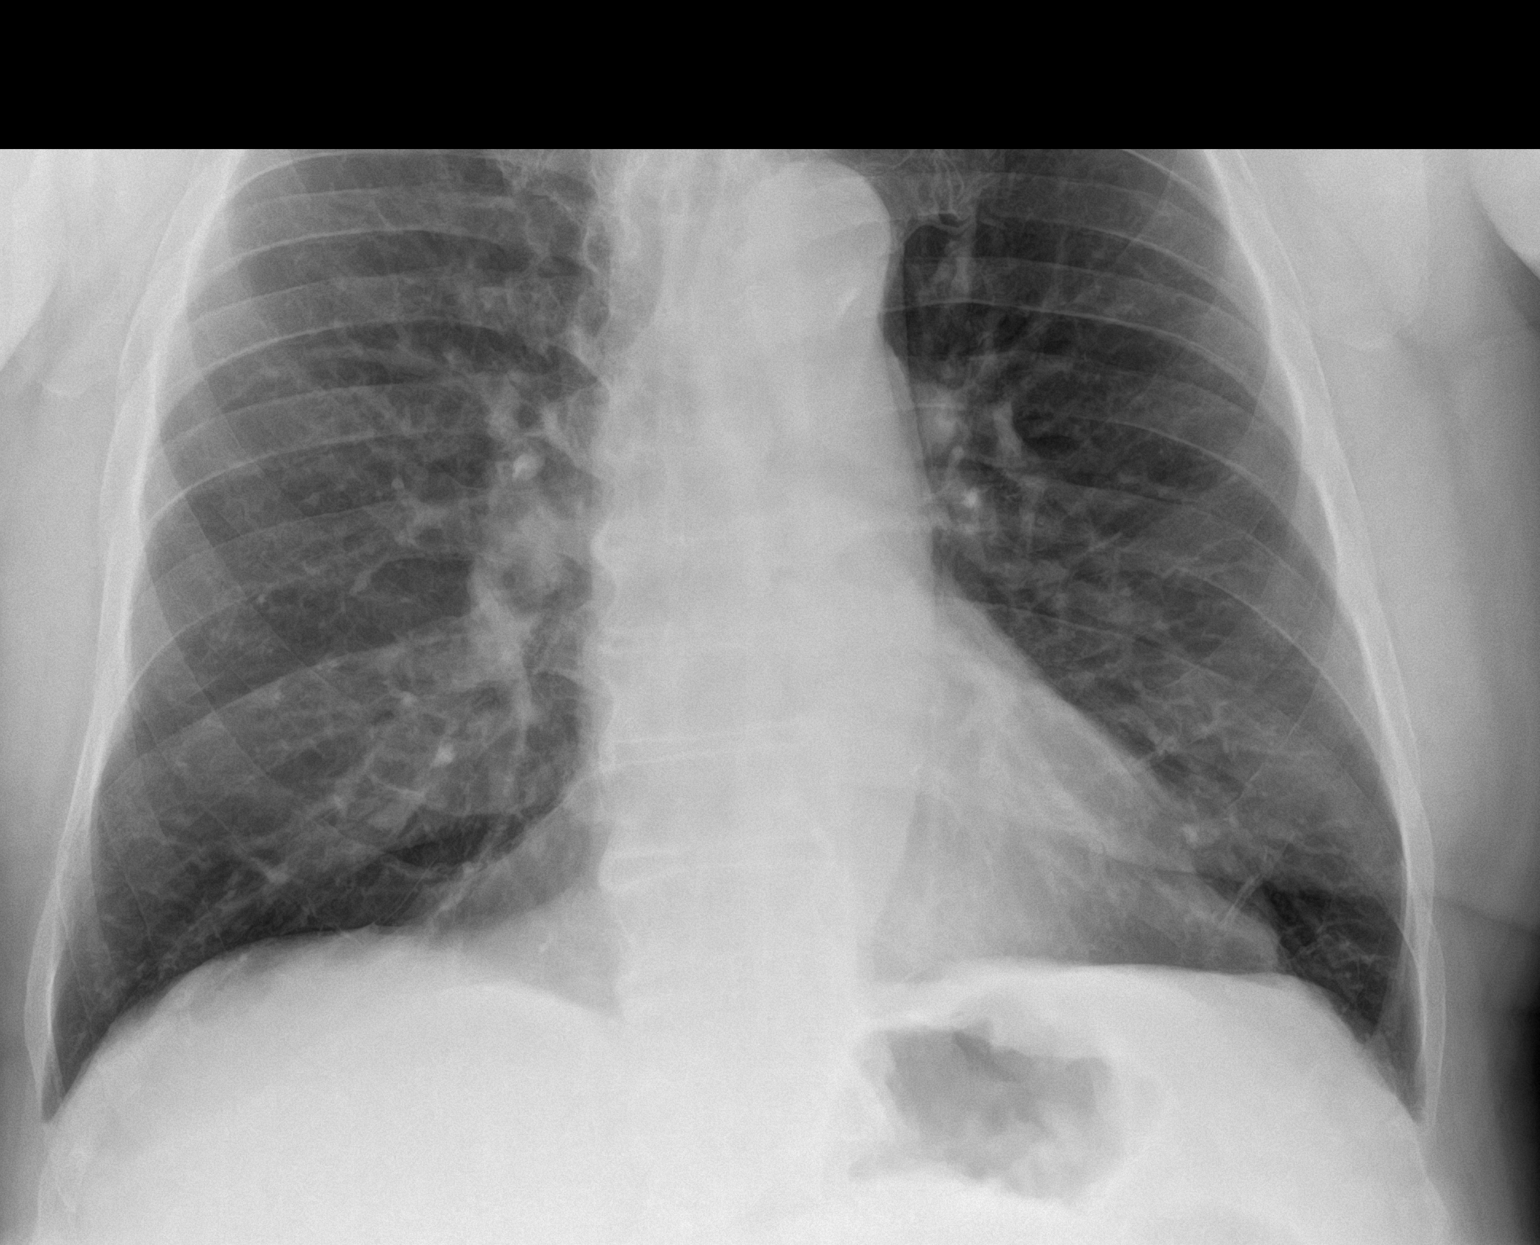

[chest lat]
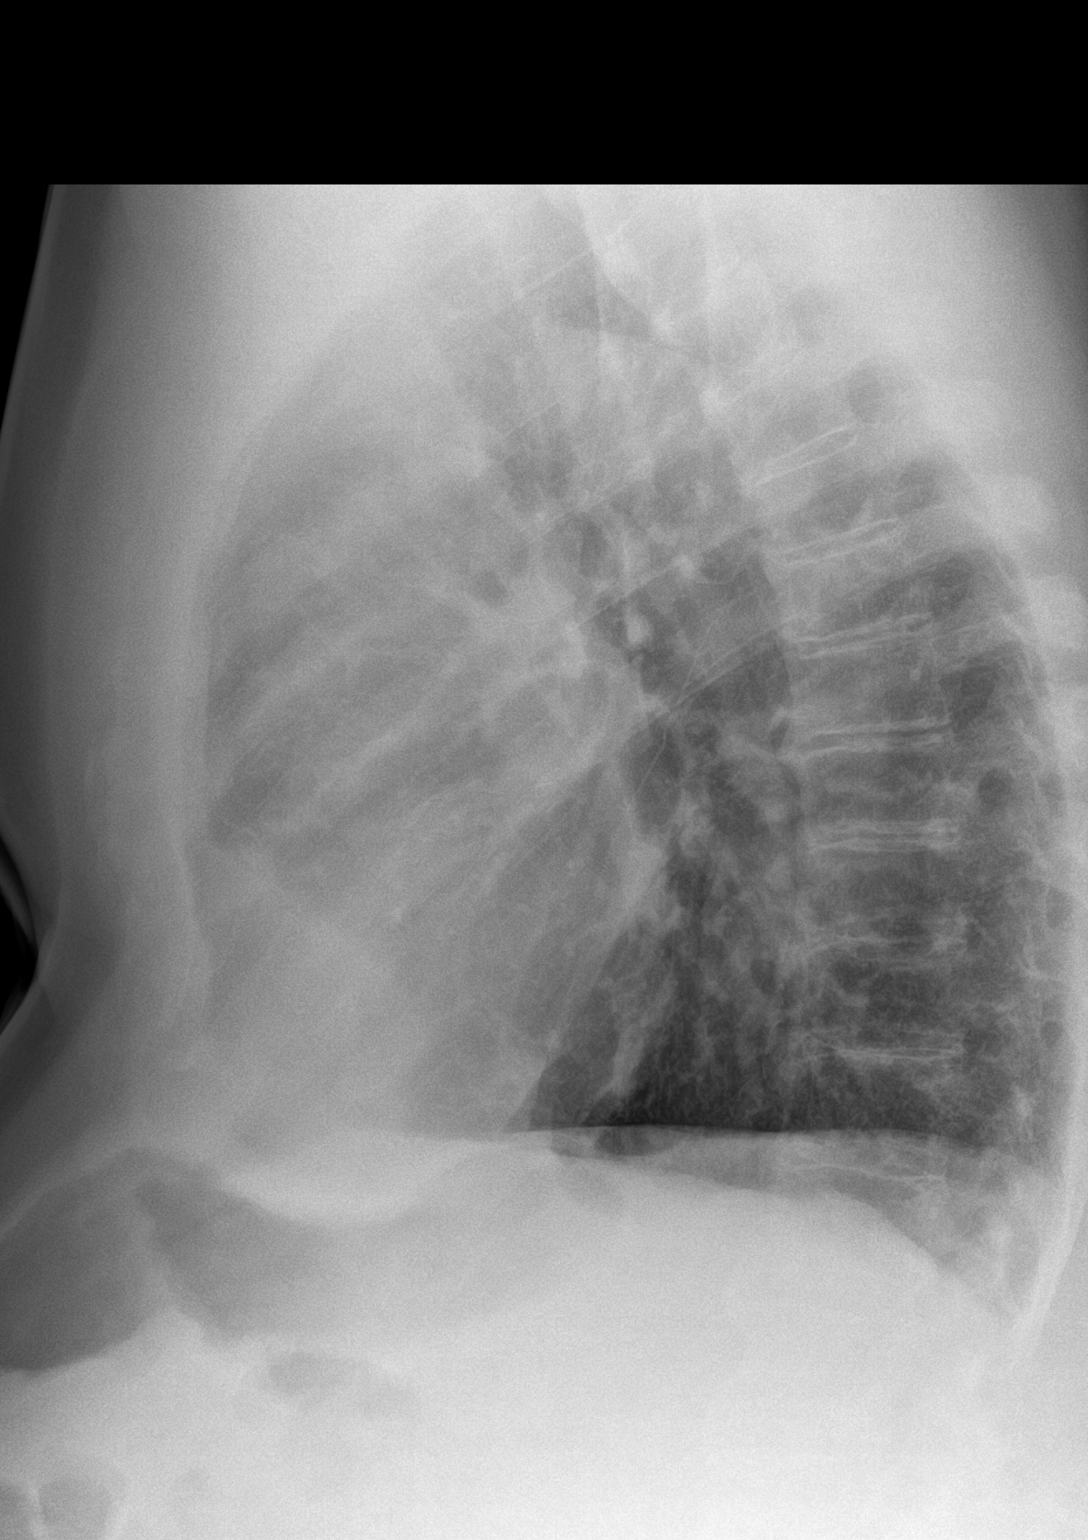

[3 of 3 positions shown; findings below may reference images not displayed]

FINDINGS: Heart is normal size. Aortic atherosclerosis. Mild hyperinflation.
No confluent opacities or effusions. No acute bony abnormality.
IMPRESSION: Hyperinflation.  No active disease.

## 2022-05-30 ENCOUNTER — Ambulatory Visit: Payer: Self-pay

## 2022-05-30 NOTE — Patient Outreach (Signed)
  Care Coordination   Follow Up Visit Note   05/30/2022 Name: Aaron [name] MRN: [id number] DOB: 28-Sep-1945  Aaron [name] is a 76 y.o. year old male who sees Aaron Lima, MD for primary care. I spoke with  Aaron [name] by phone today.  What matters to the patients health and wellness today?  Patient reports recent visit with cardiologist on 05/16/22 and reports Entresto and Vania Rea stopped by cardiologist. He states he is doing well. Denies any questions or concerns. Patient is agreeable to Care Coordinator closing case.    Goals Addressed             This Visit's Progress    COMPLETED: Care coordination post hospitalization       Care Coordination Interventions: Encouraged to continue to take medications as prescribed Upcoming/scheduled appointments reviewed with patient Encouraged patient to contact provider if any health questions or concerns       SDOH assessments and interventions completed:  No  Care Coordination Interventions:  Yes, provided   Follow up plan: No further intervention required.   Encounter Outcome:  Pt. Visit Completed   Thea Silversmith, RN, MSN, BSN, [place] Coordinator [phone]

## 2022-06-07 DIAGNOSIS — I739 Peripheral vascular disease, unspecified: Secondary | ICD-10-CM | POA: Diagnosis not present

## 2022-06-07 DIAGNOSIS — L84 Corns and callosities: Secondary | ICD-10-CM | POA: Diagnosis not present

## 2022-06-08 ENCOUNTER — Other Ambulatory Visit: Payer: Self-pay | Admitting: Internal Medicine

## 2022-06-08 DIAGNOSIS — F411 Generalized anxiety disorder: Secondary | ICD-10-CM

## 2022-06-08 DIAGNOSIS — G4700 Insomnia, unspecified: Secondary | ICD-10-CM

## 2022-06-16 DIAGNOSIS — M545 Low back pain, unspecified: Secondary | ICD-10-CM | POA: Diagnosis not present

## 2022-06-21 ENCOUNTER — Inpatient Hospital Stay: Payer: Medicare Other | Attending: Internal Medicine

## 2022-06-21 ENCOUNTER — Inpatient Hospital Stay (HOSPITAL_BASED_OUTPATIENT_CLINIC_OR_DEPARTMENT_OTHER): Payer: Medicare Other | Admitting: Internal Medicine

## 2022-06-21 VITALS — BP 156/63 | HR 77 | Temp 98.2°F | Resp 18 | Wt 235.3 lb

## 2022-06-21 DIAGNOSIS — D500 Iron deficiency anemia secondary to blood loss (chronic): Secondary | ICD-10-CM | POA: Diagnosis not present

## 2022-06-21 DIAGNOSIS — I10 Essential (primary) hypertension: Secondary | ICD-10-CM | POA: Diagnosis not present

## 2022-06-21 DIAGNOSIS — K922 Gastrointestinal hemorrhage, unspecified: Secondary | ICD-10-CM | POA: Diagnosis not present

## 2022-06-21 LAB — CBC WITH DIFFERENTIAL (CANCER CENTER ONLY)
Abs Immature Granulocytes: 0.18 10*3/uL — ABNORMAL HIGH (ref 0.00–0.07)
Basophils Absolute: 0.1 10*3/uL (ref 0.0–0.1)
Basophils Relative: 1 %
Eosinophils Absolute: 0.4 10*3/uL (ref 0.0–0.5)
Eosinophils Relative: 4 %
HCT: 36.5 % — ABNORMAL LOW (ref 39.0–52.0)
Hemoglobin: 12 g/dL — ABNORMAL LOW (ref 13.0–17.0)
Immature Granulocytes: 2 %
Lymphocytes Relative: 19 %
Lymphs Abs: 1.8 10*3/uL (ref 0.7–4.0)
MCH: 27.7 pg (ref 26.0–34.0)
MCHC: 32.9 g/dL (ref 30.0–36.0)
MCV: 84.3 fL (ref 80.0–100.0)
Monocytes Absolute: 1.1 10*3/uL — ABNORMAL HIGH (ref 0.1–1.0)
Monocytes Relative: 11 %
Neutro Abs: 6.1 10*3/uL (ref 1.7–7.7)
Neutrophils Relative %: 63 %
Platelet Count: 261 10*3/uL (ref 150–400)
RBC: 4.33 MIL/uL (ref 4.22–5.81)
RDW: 17.2 % — ABNORMAL HIGH (ref 11.5–15.5)
WBC Count: 9.6 10*3/uL (ref 4.0–10.5)
nRBC: 0 % (ref 0.0–0.2)

## 2022-06-21 LAB — IRON AND IRON BINDING CAPACITY (CC-WL,HP ONLY)
Iron: 66 ug/dL (ref 45–182)
Saturation Ratios: 23 % (ref 17.9–39.5)
TIBC: 288 ug/dL (ref 250–450)
UIBC: 222 ug/dL (ref 117–376)

## 2022-06-21 LAB — FERRITIN: Ferritin: 92 ng/mL (ref 24–336)

## 2022-06-21 NOTE — Progress Notes (Signed)
[place] [phone]   [phone]  OFFICE PROGRESS NOTE  [name], MD [place] [id number]  DIAGNOSIS: Microcytic anemia secondary to iron deficiency secondary to gastrointestinal hemorrhage.  PRIOR THERAPY: Iron infusion with Venofer 300 Mg IV weekly on as-needed basis.  CURRENT THERAPY: Ferrous sulfate 325 mg p.o. daily.  INTERVAL HISTORY: Aaron [name] 76 y.o. male returns to the clinic today for follow-up visit accompanied by his wife.  The patient is feeling much better today after receiving iron infusion with Venofer.  He continues to tolerate his treatment with oral iron tablet fairly well.  He denied having any current chest pain, shortness of breath, cough or hemoptysis.  He has no nausea, vomiting, diarrhea or constipation.  He has no headache or visual changes.  He denied having any weight loss or night sweats.  He is here today for evaluation with repeat CBC, iron study and ferritin.  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  MEDICATIONS:  Current Outpatient Medications  Medication  Sig Dispense Refill   amLODipine (NORVASC) 5 MG tablet TAKE 1 TABLET BY MOUTH EVERYDAY AT BEDTIME 90 tablet 0   aspirin EC 81 MG tablet Take 1 tablet (81 mg total) by mouth daily. Swallow whole. 30 tablet 0   atorvastatin (LIPITOR) 80 MG tablet TAKE 1 TABLET BY MOUTH ONCE  DAILY 100 tablet 1   clonazePAM (KLONOPIN) 0.5 MG tablet TAKE 1 TABLET BY MOUTH EVERYDAY AT BEDTIME 90 tablet 3   ezetimibe (ZETIA) 10 MG tablet TAKE 1 TABLET BY MOUTH  DAILY 90 tablet 3   ferrous sulfate 325 (65 FE) MG EC tablet TAKE 1 TABLET BY MOUTH DAILY WITH BREAKFAST. SCHEDULE AN APPOINTMENT FOR FURTHER REFILLS (Patient taking differently: Take 325 mg by mouth daily with breakfast.) 60 tablet 3   furosemide (LASIX) 40 MG tablet Take 1 tablet (40 mg total) by mouth daily. 90 tablet 3   hydrALAZINE (APRESOLINE) 25 MG tablet Take 1 tablet (25 mg total) by mouth 3 (three) times daily. 270 tablet 3   pantoprazole (PROTONIX) 40 MG tablet Take 1 tablet (40 mg total) by mouth daily. 90 tablet 1   polyethylene glycol (MIRALAX / GLYCOLAX) 17 g packet Take 17 g by mouth daily. With coffee     spironolactone (ALDACTONE) 25 MG tablet Take 1 tablet (25 mg total) by mouth daily. 90 tablet  3   tiZANidine (ZANAFLEX) 4 MG tablet Take 4 mg by mouth every 8 (eight) hours as needed for muscle spasms.     traMADol (ULTRAM) 50 MG tablet Take 2 tablets (100 mg total) by mouth every 8 (eight) hours as needed for moderate pain. 540 tablet 1   traZODone (DESYREL) 50 MG tablet TAKE 1 TABLET BY MOUTH EVERYDAY AT BEDTIME (Patient taking differently: Take 50 mg by mouth at bedtime.) 90 tablet 1   venlafaxine XR (EFFEXOR-XR) 150 MG 24 hr capsule TAKE 1 CAPSULE BY MOUTH DAILY  WITH BREAKFAST 100 capsule 0   No current facility-administered medications for this visit.    SURGICAL HISTORY:  Past Surgical History:  Procedure Laterality Date   BUBBLE STUDY  03/24/2022   Procedure: BUBBLE STUDY;  Surgeon: Thornton Park, MD;  Location: [place];   Service: Gastroenterology;;   CARDIAC CATHETERIZATION     carpel tunnel release Bilateral ~ 06/2005/~ 08/2005   right/left   COLONOSCOPY W/ BIOPSIES AND POLYPECTOMY     CORONARY ANGIOPLASTY     CORONARY STENT PLACEMENT  02/13/2004   1   ENUCLEATION Right    age 67   ESOPHAGOGASTRODUODENOSCOPY (EGD) WITH PROPOFOL N/A 03/24/2022   Procedure: ESOPHAGOGASTRODUODENOSCOPY (EGD) WITH PROPOFOL;  Surgeon: Thornton Park, MD;  Location: [place];  Service: Gastroenterology;  Laterality: N/A;   EYE SURGERY     age 45; "ruptured  right pupil"   EYE SURGERY     age 58   [place]   placed artificial right eye   HEMOSTASIS CONTROL  03/24/2022   Procedure: HEMOSTASIS CONTROL;  Surgeon: Thornton Park, MD;  Location: [place];  Service: Gastroenterology;;   HOT HEMOSTASIS N/A 03/24/2022   Procedure: HOT HEMOSTASIS (ARGON PLASMA COAGULATION/BICAP);  Surgeon: Thornton Park, MD;  Location: [place];  Service: Gastroenterology;  Laterality: N/A;   INGUINAL HERNIA REPAIR Right 1983   INGUINAL HERNIA REPAIR Left 1985   IR IVC FILTER PLMT / S&I /IMG GUID/MOD SED  03/29/2022   KNEE ARTHROSCOPY  ~ 06/2005   right   LEFT HEART CATH AND CORONARY ANGIOGRAPHY N/A 03/25/2022   Procedure: LEFT HEART CATH AND CORONARY ANGIOGRAPHY;  Surgeon: Early Osmond, MD;  Location: [facility];  Service: Cardiovascular;  Laterality: N/A;   MULTIPLE TOOTH EXTRACTIONS     spinal injections  2006-2007   "maybe 10 injections for herniated discs"   TEE WITHOUT CARDIOVERSION  03/24/2022   Procedure: TRANSESOPHAGEAL ECHOCARDIOGRAM (TEE);  Surgeon: Thornton Park, MD;  Location: [place];  Service: Gastroenterology;;   TONSILLECTOMY     "when I was real young"   TOTAL KNEE ARTHROPLASTY Left 10/09/2015   Procedure: TOTAL KNEE ARTHROPLASTY;  Surgeon: Dorna Leitz, MD;  Location: [place];  Service: Orthopedics;  Laterality: Left;   TOTAL KNEE ARTHROPLASTY Right 04/04/2016   Procedure: TOTAL KNEE  ARTHROPLASTY;  Surgeon: Dorna Leitz, MD;  Location: [place];  Service: Orthopedics;  Laterality: Right;    REVIEW OF SYSTEMS:  A comprehensive review of systems was negative except for: Constitutional: positive for fatigue   PHYSICAL EXAMINATION: General appearance: alert, cooperative, and no distress Head: Normocephalic, without obvious abnormality, atraumatic Neck: no adenopathy, no JVD, supple, symmetrical, trachea midline, and thyroid not enlarged, symmetric, no tenderness/mass/nodules Lymph nodes: Cervical, supraclavicular, and axillary nodes normal. Resp: clear to auscultation bilaterally Back: symmetric, no curvature. ROM normal. No CVA tenderness. Cardio: regular rate and rhythm, S1, S2 normal, no murmur, click, rub or gallop GI: soft, non-tender; bowel sounds  normal; no masses,  no organomegaly Extremities: extremities normal, atraumatic, no cyanosis or edema  ECOG PERFORMANCE STATUS: 1 - Symptomatic but completely ambulatory  Blood pressure (!) 156/63, pulse 77, temperature 98.2 F (36.8 C), temperature source Oral, resp. rate 18, weight 235 lb 4.8 oz (106.7 kg), SpO2 99 %.  LABORATORY DATA: Lab Results  Component Value Date   WBC 9.6 06/21/2022   HGB 12.0 (L) 06/21/2022   HCT 36.5 (L) 06/21/2022   MCV 84.3 06/21/2022   PLT 261 06/21/2022      Chemistry      Component Value Date/Time   NA 139 04/18/2022 1509   K 4.7 04/18/2022 1509   CL 102 04/18/2022 1509   CO2 19 (L) 04/18/2022 1509   BUN 24 04/18/2022 1509   CREATININE 1.07 04/18/2022 1509   CREATININE 1.24 06/15/2020 1306   CREATININE 1.16 02/10/2020 1532      Component Value Date/Time   CALCIUM 9.1 04/18/2022 1509   ALKPHOS 71 03/24/2022 0415   AST 31 03/24/2022 0415   AST 17 06/15/2020 1306   ALT 22 03/24/2022 0415   ALT 12 06/15/2020 1306   BILITOT 0.2 (L) 03/24/2022 0415   BILITOT 0.3 12/17/2020 1017   BILITOT 0.4 06/15/2020 1306       RADIOGRAPHIC STUDIES: No results found.  ASSESSMENT AND  PLAN: This is a very pleasant 76 years old white male with history of iron deficiency anemia secondary to chronic gastrointestinal blood loss. The patient is currently on oral iron tablet with ferrous sulfate 325 mg p.o. daily and tolerating this treatment well. The patient was found recently to have significant iron deficiency anemia. He has significant gastric antral vascular ectasia with acute blood loss few weeks ago. The patient received 3 units of PRBCs transfusion during his hospitalization. He was treated with iron infusion with Venofer at the [facility] infusion center and tolerated it fairly well. Repeat CBC today showed improvement of his hemoglobin up to 12.0 and hematocrit of 36.5%.  He also has improvement of his iron study with normal serum iron and iron saturation.  Ferritin level is still pending. I recommended for the patient to continue on current oral iron tablet for now. I will see him back for follow-up visit in 3 months for evaluation with repeat CBC, iron study and ferritin. He is followed by gastroenterology for his gastrointestinal blood loss. The patient was advised to call immediately if he has any other concerning symptoms in the interval. The patient voices understanding of current disease status and treatment options and is in agreement with the current care plan.  All questions were answered. The patient knows to call the clinic with any problems, questions or concerns. We can certainly see the patient much sooner if necessary.  The total time spent in the appointment was 20 minutes.  Disclaimer: This note was dictated with voice recognition software. Similar sounding words can inadvertently be transcribed and may not be corrected upon review.

## 2022-06-21 NOTE — Progress Notes (Unsigned)
06/22/2022 Aaron [name] [id number] [birth year]  Referring provider: [name], MD Primary GI doctor: Dr. Tarri Glenn  ASSESSMENT AND PLAN:   Iron deficiency anemia with recent EGD with GAVE and history of colon polyps over due for colonoscopy Recent hospitalization with hemoglobin 4.8, transfused 3 units. EGD 03/24/2022 showed GAVE s/p APC and Hemospray Last colonoscopy 2012 recall 5 years, patient is overdue. Most likely from [place], continues to have iron deficiency getting supportive care with Dr. Earlie Server, will plan for repeat EGD and colonoscopy in the hospital. Patient has been 3 months out from ventricular fibrillation, cleared by cardiology, off LifeVest. Will proceed with scheduling for EGD and colonoscopy in the hospital with Dr. Tarri Glenn to evaluate IDA. Final recommendation per Dr. Tarri Glenn   Ventricular fibrillation [facility]) During recent hospitalization with hemoglobin 4.8 had subsequent V-fib with cardiac arrest post defibrillation with ROSC 03/25/2022 cardiac catheterization mild to moderate obstructive disease within mid LAD stent 03/26/2022 EF 55-60%, normal valves 05/16/2022 follow up with Dr. Martinique Felt secondary to anemia.  DVT s/p IVC filter Off Xarelto, on bASA  Chronic idiopathic constipation - Increase fiber/ water intake, decrease caffeine, increase activity level. -Will add on Miralax daily, improvement of symptoms   Patient Care Team: [name], MD as PCP - General (Internal Medicine) Martinique, Peter M, MD as PCP - Cardiology (Cardiology) Juanita Craver, MD as Consulting Physician (Gastroenterology) Luretha Rued, RN as Panacea, Tami Lin, Balta as Physician Assistant (Cardiology)  HISTORY OF PRESENT ILLNESS: 76 y.o. male with a past medical history of anxiety, hypertension, hyperlipidemia, coronary artery disease s/p DES to the LAD 2005, PE/DVTs on Xarelto, IDA, obesity, obstructive sleep apnea,  pre-diabetes, psoriasis, GERD and others listed below presents   Hospital follow-up.  Patient arrived to the hospital after a fall with dizziness, dyspnea, found to have hemoglobin 4.8, had subsequent V-fib with cardiac arrest status post defibrillation x1 with ROSC.  Transfused 3 units.   Was having melenic stools at that time. 03/25/2022 cardiac catheterization mild to moderate obstructive disease within mid LAD stent 03/24/2022 EGD showed GAVE s/p APC and spray. Due to increased risk of rebleed, IVC filter placed by IR. Currently off xarelto and only on bASA.  Has had 3 infusions of iron, last one was a month ago, has been on oral iron once a day in additoin.  He had labs with hematology yesterday.  Showed HGB 12, MCV 84.3, iron 66, Saturday ratio 23, ferritin 92.   He continues on the pantoprazole once a day.  He has improved energy, no SOB, no CP, has some leg swelling still. He walks with cane,  doing ADL's, cooking, etc and doing PT in the house.  He states stools have been formed.  He has no black stools, no GERD, no nausea.  No AB pain.  No hematochezia.  No weight loss.  He has been having Bm's daily with miralax daily.   Had colonoscopy 2006-2008 per patient, then had colonoscopy with Dr. Collene Mares 2012/2013, and was suppose to have repeat 2017 but had both knees replaced so did not have another and was lost to follow up.  History of colon polyps. No family history.    He  reports that he quit smoking about 26 years ago. His smoking use included cigarettes. He has a 37.50 pack-year smoking history. He has never used smokeless tobacco. He reports that he does not drink alcohol and does not use drugs.  Current Medications:  Current Outpatient Medications (Cardiovascular):    amLODipine (NORVASC) 5 MG tablet, TAKE 1 TABLET BY MOUTH EVERYDAY AT BEDTIME   atorvastatin (LIPITOR) 80 MG tablet, TAKE 1 TABLET BY MOUTH ONCE  DAILY   ezetimibe (ZETIA) 10 MG tablet, TAKE 1 TABLET BY  MOUTH  DAILY   furosemide (LASIX) 40 MG tablet, Take 1 tablet (40 mg total) by mouth daily.   hydrALAZINE (APRESOLINE) 25 MG tablet, Take 1 tablet (25 mg total) by mouth 3 (three) times daily.   spironolactone (ALDACTONE) 25 MG tablet, Take 1 tablet (25 mg total) by mouth daily.   Current Outpatient Medications (Analgesics):    aspirin EC 81 MG tablet, Take 1 tablet (81 mg total) by mouth daily. Swallow whole.   traMADol (ULTRAM) 50 MG tablet, Take 2 tablets (100 mg total) by mouth every 8 (eight) hours as needed for moderate pain.  Current Outpatient Medications (Hematological):    ferrous sulfate 325 (65 FE) MG EC tablet, TAKE 1 TABLET BY MOUTH DAILY WITH BREAKFAST. SCHEDULE AN APPOINTMENT FOR FURTHER REFILLS (Patient taking differently: Take 325 mg by mouth daily with breakfast.)  Current Outpatient Medications (Other):    clonazePAM (KLONOPIN) 0.5 MG tablet, TAKE 1 TABLET BY MOUTH EVERYDAY AT BEDTIME   pantoprazole (PROTONIX) 40 MG tablet, Take 1 tablet (40 mg total) by mouth daily.   polyethylene glycol (MIRALAX / GLYCOLAX) 17 g packet, Take 17 g by mouth daily. With coffee   tiZANidine (ZANAFLEX) 4 MG tablet, Take 4 mg by mouth every 8 (eight) hours as needed for muscle spasms.   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; 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"   Heart murmur    Hyperlipidemia    Hypertension    Insomnia    Osteoarthritis     PONV (postoperative nausea and vomiting)    2006 - knee surgery   Pre-diabetes    Psoriasis    PTSD (post-traumatic stress disorder)    Pulmonary embolism ([place]) ~ 03/2006   bilaterally   Seasonal allergies    Shortness of breath 06/08/11   "w/exertion; that's why I'm here"   Sleep apnea, obstructive    Umbilical hernia    Allergies:  Allergies  Allergen Reactions   Wellbutrin [Bupropion] Other (See Comments)    Hallucinations, sweating   Amoxicillin Palpitations     Surgical History:  He  has a past surgical history that includes spinal injections (2006-2007); carpel tunnel release (Bilateral, ~ 06/2005/~ 08/2005); Coronary stent placement (02/13/2004); Inguinal hernia repair (Right, 1983); Inguinal hernia repair (Left, 1985); Tonsillectomy; Eye surgery; Eye surgery; Enucleation (Right); Eye surgery [phone]); Cardiac catheterization; Knee arthroscopy (~ 06/2005); Coronary angioplasty; Total knee arthroplasty (Left, 10/09/2015); Colonoscopy w/ biopsies and polypectomy; Multiple tooth extractions; Total knee arthroplasty (Right, 04/04/2016); LEFT HEART CATH AND CORONARY ANGIOGRAPHY (N/A,  03/25/2022); Esophagogastroduodenoscopy (egd) with propofol (N/A, 03/24/2022); TEE without cardioversion (03/24/2022); Hot hemostasis (N/A, 03/24/2022); Hemostasis control (03/24/2022); Bubble study (03/24/2022); and IR IVC FILTER PLMT / S&I /IMG GUID/MOD SED (03/29/2022). Family History:  His family history includes Asthma in his brother, father, and sister; Breast cancer in his sister; Emphysema in his father; Liver cancer in his sister.  REVIEW OF SYSTEMS  : All other systems reviewed and negative except where noted in the History of Present Illness.  PHYSICAL EXAM: BP (!) 146/64   Pulse 85   Ht '5\' 6"'$  (1.676 m)   Wt 235 lb (106.6 kg)   BMI 37.93 kg/m  General:   Pleasant, obese male in no acute distress Head:   Normocephalic and atraumatic. Eyes:  sclerae anicteric,conjunctive pink  Heart:   regular rate and  rhythm, LifeVest in place Pulm:  Clear anteriorly; no wheezing Abdomen:   Soft, Obese AB, Active bowel sounds. No tenderness . , No organomegaly appreciated. Rectal: Not evaluated Extremities:  With 1-2 edema. Msk: Symmetrical without gross deformities. Peripheral pulses intact.  Walks with walker, unable to get on table. Neurologic:  Alert and  oriented x4;  No focal deficits.  Skin:   Dry and intact without significant lesions or rashes. Psychiatric:  Cooperative. Normal mood and affect.    Vladimir Crofts, PA-C 3:00 PM

## 2022-06-22 ENCOUNTER — Encounter: Payer: Self-pay | Admitting: Physician Assistant

## 2022-06-22 ENCOUNTER — Ambulatory Visit: Payer: Medicare Other | Admitting: Physician Assistant

## 2022-06-22 VITALS — BP 146/64 | HR 85 | Ht 66.0 in | Wt 235.0 lb

## 2022-06-22 DIAGNOSIS — Z86010 Personal history of colonic polyps: Secondary | ICD-10-CM | POA: Diagnosis not present

## 2022-06-22 DIAGNOSIS — K31819 Angiodysplasia of stomach and duodenum without bleeding: Secondary | ICD-10-CM

## 2022-06-22 DIAGNOSIS — I4901 Ventricular fibrillation: Secondary | ICD-10-CM | POA: Diagnosis not present

## 2022-06-22 DIAGNOSIS — K5904 Chronic idiopathic constipation: Secondary | ICD-10-CM

## 2022-06-22 DIAGNOSIS — D509 Iron deficiency anemia, unspecified: Secondary | ICD-10-CM | POA: Diagnosis not present

## 2022-06-22 MED ORDER — NA SULFATE-K SULFATE-MG SULF 17.5-3.13-1.6 GM/177ML PO SOLN: 1.0000 | Freq: Once | ORAL | 0 refills | Status: AC

## 2022-06-22 NOTE — Patient Instructions (Addendum)
If you are age 76 or older, your body mass index should be between 23-30. Your Body mass index is 37.93 kg/m. If this is out of the aforementioned range listed, please consider follow up with your Primary Care Provider.  If you are age 43 or younger, your body mass index should be between 19-25. Your Body mass index is 37.93 kg/m. If this is out of the aformentioned range listed, please consider follow up with your Primary Care Provider.   ________________________________________________________  Aaron [name] have been scheduled for an endoscopy and colonoscopy. Please follow the written instructions given to you at your visit today. Please pick up your prep supplies at the pharmacy within the next 1-3 days. If you use inhalers (even only as needed), please bring them with you on the day of your procedure.   Thank you for entrusting me with your care and for choosing [facility], Vicie Mutters, P.A.-C

## 2022-06-23 ENCOUNTER — Ambulatory Visit: Payer: Medicare Other | Admitting: Internal Medicine

## 2022-06-23 ENCOUNTER — Other Ambulatory Visit: Payer: Medicare Other

## 2022-06-25 ENCOUNTER — Other Ambulatory Visit: Payer: Self-pay | Admitting: Cardiology

## 2022-07-07 NOTE — Progress Notes (Signed)
Reviewed and agree with management plans. ? ?Nioma Mccubbins L. Marchella Hibbard, MD, MPH  ?

## 2022-07-17 DIAGNOSIS — G4733 Obstructive sleep apnea (adult) (pediatric): Secondary | ICD-10-CM | POA: Diagnosis not present

## 2022-07-28 ENCOUNTER — Other Ambulatory Visit: Payer: Self-pay | Admitting: Internal Medicine

## 2022-07-28 DIAGNOSIS — I10 Essential (primary) hypertension: Secondary | ICD-10-CM

## 2022-08-01 ENCOUNTER — Other Ambulatory Visit: Payer: Self-pay | Admitting: Internal Medicine

## 2022-08-01 DIAGNOSIS — K219 Gastro-esophageal reflux disease without esophagitis: Secondary | ICD-10-CM

## 2022-08-04 ENCOUNTER — Encounter (HOSPITAL_COMMUNITY): Payer: Self-pay | Admitting: Gastroenterology

## 2022-08-10 NOTE — Anesthesia Preprocedure Evaluation (Signed)
Anesthesia Evaluation  Patient identified by MRN, date of birth, ID band  Reviewed: Allergy & Precautions, NPO status , Patient's Chart, lab work & pertinent test results  Airway Mallampati: II  TM Distance: >3 FB Neck ROM: Full    Dental no notable dental hx. (+) Edentulous Upper, Edentulous Lower   Pulmonary sleep apnea , former smoker   Pulmonary exam normal breath sounds clear to auscultation       Cardiovascular hypertension, + CAD  Normal cardiovascular exam Rhythm:Regular Rate:Normal     Neuro/Psych    GI/Hepatic   Endo/Other    Morbid obesity  Renal/GU      Musculoskeletal   Abdominal   Peds  Hematology   Anesthesia Other Findings   Reproductive/Obstetrics                             Anesthesia Physical Anesthesia Plan  ASA: 3  Anesthesia Plan: MAC   Post-op Pain Management: Minimal or no pain anticipated   Induction: Intravenous  PONV Risk Score and Plan: Propofol infusion and Treatment may vary due to age or medical condition  Airway Management Planned: Natural Airway and Nasal Cannula  Additional Equipment: None  Intra-op Plan:   Post-operative Plan:   Informed Consent: I have reviewed the patients History and Physical, chart, labs and discussed the procedure including the risks, benefits and alternatives for the proposed anesthesia with the patient or authorized representative who has indicated his/her understanding and acceptance.     Dental advisory given  Plan Discussed with: CRNA and Anesthesiologist  Anesthesia Plan Comments: (Fe Deficiency Anemia, Colon Polyps GAVE)        Anesthesia Quick Evaluation

## 2022-08-11 ENCOUNTER — Ambulatory Visit (HOSPITAL_COMMUNITY)
Admission: RE | Admit: 2022-08-11 | Discharge: 2022-08-11 | Disposition: A | Discharge status: Home / Self Care | Payer: Medicare Other | Source: Ambulatory Visit | Attending: Gastroenterology | Admitting: Gastroenterology

## 2022-08-11 ENCOUNTER — Ambulatory Visit (HOSPITAL_BASED_OUTPATIENT_CLINIC_OR_DEPARTMENT_OTHER): Payer: Medicare Other | Admitting: Anesthesiology

## 2022-08-11 ENCOUNTER — Encounter (HOSPITAL_COMMUNITY): Payer: Self-pay | Admitting: Gastroenterology

## 2022-08-11 ENCOUNTER — Encounter (HOSPITAL_COMMUNITY)
Admission: RE | Disposition: A | Discharge status: Home / Self Care | Payer: Self-pay | Source: Ambulatory Visit | Attending: Gastroenterology

## 2022-08-11 ENCOUNTER — Other Ambulatory Visit: Payer: Self-pay

## 2022-08-11 ENCOUNTER — Ambulatory Visit (HOSPITAL_COMMUNITY): Payer: Medicare Other | Admitting: Anesthesiology

## 2022-08-11 DIAGNOSIS — Z1211 Encounter for screening for malignant neoplasm of colon: Secondary | ICD-10-CM | POA: Diagnosis not present

## 2022-08-11 DIAGNOSIS — Z6837 Body mass index (BMI) 37.0-37.9, adult: Secondary | ICD-10-CM | POA: Diagnosis not present

## 2022-08-11 DIAGNOSIS — Z09 Encounter for follow-up examination after completed treatment for conditions other than malignant neoplasm: Secondary | ICD-10-CM | POA: Diagnosis not present

## 2022-08-11 DIAGNOSIS — G4733 Obstructive sleep apnea (adult) (pediatric): Secondary | ICD-10-CM | POA: Insufficient documentation

## 2022-08-11 DIAGNOSIS — K635 Polyp of colon: Secondary | ICD-10-CM | POA: Diagnosis not present

## 2022-08-11 DIAGNOSIS — D509 Iron deficiency anemia, unspecified: Secondary | ICD-10-CM | POA: Insufficient documentation

## 2022-08-11 DIAGNOSIS — D122 Benign neoplasm of ascending colon: Secondary | ICD-10-CM | POA: Diagnosis not present

## 2022-08-11 DIAGNOSIS — G4730 Sleep apnea, unspecified: Secondary | ICD-10-CM

## 2022-08-11 DIAGNOSIS — K31819 Angiodysplasia of stomach and duodenum without bleeding: Secondary | ICD-10-CM

## 2022-08-11 DIAGNOSIS — Z860100 Personal history of colon polyps, unspecified: Secondary | ICD-10-CM

## 2022-08-11 DIAGNOSIS — D123 Benign neoplasm of transverse colon: Secondary | ICD-10-CM

## 2022-08-11 DIAGNOSIS — Z87891 Personal history of nicotine dependence: Secondary | ICD-10-CM | POA: Diagnosis not present

## 2022-08-11 DIAGNOSIS — I2510 Atherosclerotic heart disease of native coronary artery without angina pectoris: Secondary | ICD-10-CM | POA: Insufficient documentation

## 2022-08-11 DIAGNOSIS — I10 Essential (primary) hypertension: Secondary | ICD-10-CM | POA: Diagnosis not present

## 2022-08-11 DIAGNOSIS — D120 Benign neoplasm of cecum: Secondary | ICD-10-CM | POA: Insufficient documentation

## 2022-08-11 HISTORY — PX: POLYPECTOMY: SHX5525

## 2022-08-11 HISTORY — PX: ESOPHAGOGASTRODUODENOSCOPY (EGD) WITH PROPOFOL: SHX5813

## 2022-08-11 HISTORY — PX: HOT HEMOSTASIS: SHX5433

## 2022-08-11 HISTORY — PX: COLONOSCOPY WITH PROPOFOL: SHX5780

## 2022-08-11 SURGERY — ESOPHAGOGASTRODUODENOSCOPY (EGD) WITH PROPOFOL
Anesthesia: Monitor Anesthesia Care

## 2022-08-11 MED ORDER — PROPOFOL 500 MG/50ML IV EMUL
INTRAVENOUS | Status: DC | PRN
  Administered 2022-08-11: 120100 ug/kg/min via INTRAVENOUS

## 2022-08-11 MED ORDER — PROPOFOL 1000 MG/100ML IV EMUL
INTRAVENOUS | Status: AC
  Filled 2022-08-11: qty 100

## 2022-08-11 MED ORDER — LACTATED RINGERS IV SOLN: INTRAVENOUS | Status: DC

## 2022-08-11 MED ORDER — PROPOFOL 10 MG/ML IV BOLUS
INTRAVENOUS | Status: DC | PRN
  Administered 2022-08-11 (×3): 10 mg via INTRAVENOUS

## 2022-08-11 MED ORDER — LIDOCAINE HCL 1 % IJ SOLN
INTRAMUSCULAR | Status: DC | PRN
  Administered 2022-08-11: 50 mg via INTRADERMAL

## 2022-08-11 MED ORDER — SODIUM CHLORIDE 0.9 % IV SOLN: INTRAVENOUS | Status: DC

## 2022-08-11 MED ORDER — DEXAMETHASONE SODIUM PHOSPHATE 10 MG/ML IJ SOLN
INTRAMUSCULAR | Status: DC | PRN
  Administered 2022-08-11: 8 mg via INTRAVENOUS

## 2022-08-11 SURGICAL SUPPLY — 25 items

## 2022-08-11 NOTE — Anesthesia Postprocedure Evaluation (Signed)
Anesthesia Post Note  Patient: MONTEL VANDERHOOF  Procedure(s) Performed: ESOPHAGOGASTRODUODENOSCOPY (EGD) WITH PROPOFOL COLONOSCOPY WITH PROPOFOL HOT HEMOSTASIS (ARGON PLASMA COAGULATION/BICAP) POLYPECTOMY     Patient location during evaluation: Endoscopy Anesthesia Type: MAC Level of consciousness: awake and alert Pain management: pain level controlled Vital Signs Assessment: post-procedure vital signs reviewed and stable Respiratory status: spontaneous breathing, nonlabored ventilation, respiratory function stable and patient connected to nasal cannula oxygen Cardiovascular status: blood pressure returned to baseline and stable Postop Assessment: no apparent nausea or vomiting Anesthetic complications: no  No notable events documented.  Last Vitals:  Vitals:   08/11/22 1110 08/11/22 1120  BP: (!) 154/64 (!) 164/70  Pulse: 61 65  Resp: 15 11  Temp:    SpO2: 98% 98%    Last Pain:  Vitals:   08/11/22 1120  TempSrc:   PainSc: 0-No pain                 Barnet Glasgow

## 2022-08-11 NOTE — Op Note (Signed)
[facility] Patient Name: Aaron [name] Procedure Date: 08/11/2022 MRN: [id number] Attending MD: Thornton Park MD, MD, [id number] Date of Birth: 03/27/1946 CSN: [id number] Age: 77 Admit Type: Outpatient Procedure:                Upper GI endoscopy Indications:              Iron deficiency anemia secondary to chronic blood                            loss, Suspected upper gastrointestinal bleeding in                            patient with chronic blood loss with history of                            GAVE on EGD 9/23 Providers:                Thornton Park MD, MD, Jamison Neighbor RN, RN,                            Brien Mates, Technician Referring MD:              Medicines:                Monitored Anesthesia Care Complications:            No immediate complications. Estimated Blood Loss:     Estimated blood loss was minimal. Procedure:                Pre-Anesthesia Assessment:                           - Prior to the procedure, a History and Physical                            was performed, and patient medications and                            allergies were reviewed. The patient's tolerance of                            previous anesthesia was also reviewed. The risks                            and benefits of the procedure and the sedation                            options and risks were discussed with the patient.                            All questions were answered, and informed consent                            was obtained. Prior Anticoagulants: The patient has  taken no anticoagulant or antiplatelet agents. ASA                            Grade Assessment: III - A patient with severe                            systemic disease. After reviewing the risks and                            benefits, the patient was deemed in satisfactory                            condition to undergo the procedure.                            After obtaining informed consent, the endoscope was                            passed under direct vision. Throughout the                            procedure, the patient's blood pressure, pulse, and                            oxygen saturations were monitored continuously. The                            [id number]-H190 ([id number]) Olympus endoscope was introduced                            through the mouth, and advanced to the second part                            of duodenum. The upper GI endoscopy was                            accomplished without difficulty. The patient                            tolerated the procedure well. Scope In: Scope Out: Findings:      The esophagus was normal.      Moderate gastric antral vascular ectasia without bleeding was present in       the gastric antrum. Coagulation for bleeding prevention using argon       plasma at 2 liters/minute and 30 watts was successful. Estimated blood       loss was minimal.      The examined duodenum was normal. Impression:               - Normal esophagus.                           - Gastric antral vascular ectasia without bleeding.  Treated with argon plasma coagulation (APC).                           - Normal examined duodenum.                           - No specimens collected. Moderate Sedation:      Not Applicable - Patient had care per Anesthesia. Recommendation:           - Patient has a contact number available for                            emergencies. The signs and symptoms of potential                            delayed complications were discussed with the                            patient. Return to normal activities tomorrow.                            Written discharge instructions were provided to the                            patient.                           - Resume previous diet.                           - Continue present medications.                           - Repeat  upper endoscopy at the next available                            appointment for retreatment.                           - Colonoscopy today as previously planned. Procedure Code(s):        --- Professional ---                           [phone], Esophagogastroduodenoscopy, flexible,                            transoral; with control of bleeding, any method Diagnosis Code(s):        --- Professional ---                           W96.759, Angiodysplasia of stomach and duodenum                            without bleeding CPT copyright 2022 American Medical Association. All rights reserved. The codes documented in this report are preliminary and upon coder review may  be revised to meet current compliance requirements. Thornton Park MD,  MD 08/11/2022 11:12:15 AM This report has been signed electronically. Number of Addenda: 0

## 2022-08-11 NOTE — Op Note (Signed)
[facility] Patient Name: Aaron [name] Procedure Date: 08/11/2022 MRN: [id number] Attending MD: Thornton Park MD, MD, [id number] Date of Birth: 03-08-1946 CSN: [id number] Age: 77 Admit Type: Outpatient Procedure:                Colonoscopy Indications:              Surveillance: Personal history of adenomatous                            polyps on last colonoscopy > 5 years ago - last                            colonoscopy with Dr. Collene Mares around 2013 Providers:                Thornton Park MD, MD, Jamison Neighbor RN, RN,                            Brien Mates, Technician Referring MD:              Medicines:                Monitored Anesthesia Care Complications:            No immediate complications. Estimated Blood Loss:     Estimated blood loss was minimal. Procedure:                Pre-Anesthesia Assessment:                           - Prior to the procedure, a History and Physical                            was performed, and patient medications and                            allergies were reviewed. The patient's tolerance of                            previous anesthesia was also reviewed. The risks                            and benefits of the procedure and the sedation                            options and risks were discussed with the patient.                            All questions were answered, and informed consent                            was obtained. Prior Anticoagulants: The patient has                            taken no anticoagulant or antiplatelet agents. ASA  Grade Assessment: III - A patient with severe                            systemic disease. After reviewing the risks and                            benefits, the patient was deemed in satisfactory                            condition to undergo the procedure.                           After obtaining informed consent, the colonoscope                             was passed under direct vision. Throughout the                            procedure, the patient's blood pressure, pulse, and                            oxygen saturations were monitored continuously. The                            CF-HQ190L ([id number]) Olympus colonoscope was                            introduced through the anus and advanced to the 3                            cm into the ileum. A second forward view of the                            right colon was performed. The colonoscopy was                            performed without difficulty. The patient tolerated                            the procedure well. The quality of the bowel                            preparation was good. The terminal ileum, ileocecal                            valve, appendiceal orifice, and rectum were                            photographed. Scope In: 10:38:26 AM Scope Out: 10:52:55 AM Scope Withdrawal Time: 0 hours 12 minutes 23 seconds  Total Procedure Duration: 0 hours 14 minutes 29 seconds  Findings:      The perianal and digital rectal examinations were normal.      Two polyps were found in the distal transverse colon.  The polyps were 6       (flat) to 10 (sessile) mm in size. These polyps were removed with a cold       snare. Resection and retrieval were complete. Estimated blood loss was       minimal.      A 6 mm polyp was found in the ascending colon. The polyp was sessile.       The polyp was removed with a cold snare. Resection and retrieval were       complete. Estimated blood loss was minimal.      A 2 mm polyp was found in the cecum. The polyp was sessile. The polyp       was removed with a cold snare. Resection and retrieval were complete.       Estimated blood loss was minimal.      The exam was otherwise without abnormality on direct and retroflexion       views. Impression:               - Two 8 to 10 mm polyps in the distal transverse                            colon,  removed with a cold snare. Resected and                            retrieved.                           - One 6 mm polyp in the ascending colon, removed                            with a cold snare. Resected and retrieved.                           - One 2 mm polyp in the cecum, removed with a cold                            snare. Resected and retrieved.                           - The examination was otherwise normal on direct                            and retroflexion views. Moderate Sedation:      Not Applicable - Patient had care per Anesthesia. Recommendation:           - Patient has a contact number available for                            emergencies. The signs and symptoms of potential                            delayed complications were discussed with the                            patient. Return to normal activities tomorrow.  Written discharge instructions were provided to the                            patient.                           - Resume previous diet.                           - Continue present medications.                           - Await pathology results.                           - Repeat colonoscopy is not recommended due to                            current age (15) for surveillance.                           - Emerging evidence supports eating a diet of                            fruits, vegetables, grains, calcium, and yogurt                            while reducing red meat and alcohol may reduce the                            risk of colon cancer.                           - Thank you for allowing me to be involved in your                            colon cancer prevention. Procedure Code(s):        --- Professional ---                           [id number], Colonoscopy, flexible; with removal of                            tumor(s), polyp(s), or other lesion(s) by snare                            technique Diagnosis Code(s):         --- Professional ---                           Z86.010, Personal history of colonic polyps                           D12.3, Benign neoplasm of transverse colon (hepatic  flexure or splenic flexure)                           D12.2, Benign neoplasm of ascending colon                           D12.0, Benign neoplasm of cecum CPT copyright 2022 American Medical Association. All rights reserved. The codes documented in this report are preliminary and upon coder review may  be revised to meet current compliance requirements. Thornton Park MD, MD 08/11/2022 11:10:54 AM This report has been signed electronically. Number of Addenda: 0

## 2022-08-11 NOTE — H&P (Signed)
Referring Provider: No ref. provider found Primary Care Physician:  [name], MD   Indication for EGD:  Iron deficiency anemia Indication for Colonoscopy:  Iron deficiency anemia   IMPRESSION:  Iron deficiency anemia GAVE treated with APC 03/24/2022 History of colon polyps, last colonoscopy 2013 Chronic constipation  PLAN: EGD and Colonoscopy   HPI: Aaron [name] is a 77 y.o. male presents for further endoscopic evaluation of iron deficiency anemia.  Recent hospitalization with a hemoglobin of 4.8 requiring 3 units of packed red blood cells.  Had an EGD 03/24/2022 that showed GAVE.  This was treated with APC and Hemospray.    Chronic GI symptoms include chronic idiopathic constipation.  Had colonoscopy 2006-2008 per patient, then had colonoscopy with Dr. Collene Mares 2012/2013, and was suppose to have repeat 2017 but had both knees replaced so did not have another and was lost to follow up.  History of colon polyps. No family history.    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"   Heart murmur    Hyperlipidemia    Hypertension    Insomnia    Osteoarthritis    PONV (postoperative nausea and vomiting)    2006 - knee surgery   Pre-diabetes    Psoriasis    PTSD (post-traumatic stress disorder)    Pulmonary embolism ([place]) ~ 03/2006   bilaterally   Seasonal allergies    Shortness of breath 06/08/11   "w/exertion; that's why I'm here"   Sleep apnea, obstructive    Umbilical hernia     Past Surgical History:  Procedure Laterality Date   BUBBLE STUDY  03/24/2022   Procedure: BUBBLE STUDY;  Surgeon: Thornton Park, MD;  Location: [place];   Service: Gastroenterology;;   CARDIAC CATHETERIZATION     carpel tunnel release Bilateral ~ 06/2005/~ 08/2005   right/left   COLONOSCOPY W/ BIOPSIES AND POLYPECTOMY     CORONARY ANGIOPLASTY     CORONARY STENT PLACEMENT  02/13/2004   1   ENUCLEATION Right    age 23   ESOPHAGOGASTRODUODENOSCOPY (EGD) WITH PROPOFOL N/A 03/24/2022   Procedure: ESOPHAGOGASTRODUODENOSCOPY (EGD) WITH PROPOFOL;  Surgeon: Thornton Park, MD;  Location: [place];  Service: Gastroenterology;  Laterality: N/A;   EYE SURGERY     age 6; "ruptured  right pupil"   EYE SURGERY     age 26   [place]   placed artificial right eye   HEMOSTASIS CONTROL  03/24/2022   Procedure: HEMOSTASIS CONTROL;  Surgeon: Thornton Park, MD;  Location: [place];  Service: Gastroenterology;;   HOT HEMOSTASIS N/A 03/24/2022   Procedure: HOT HEMOSTASIS (ARGON PLASMA COAGULATION/BICAP);  Surgeon: Thornton Park, MD;  Location: [place];  Service: Gastroenterology;  Laterality: N/A;   INGUINAL HERNIA REPAIR Right 1983   INGUINAL HERNIA REPAIR Left 1985   IR IVC FILTER PLMT / S&I /IMG GUID/MOD SED  03/29/2022   KNEE ARTHROSCOPY  ~ 06/2005   right   LEFT HEART CATH AND CORONARY ANGIOGRAPHY N/A 03/25/2022   Procedure: LEFT HEART CATH  AND CORONARY ANGIOGRAPHY;  Surgeon: Early Osmond, MD;  Location: [place] [facility];  Service: Cardiovascular;  Laterality: N/A;   MULTIPLE TOOTH EXTRACTIONS     spinal injections  2006-2007   "maybe 10 injections for herniated discs"   TEE WITHOUT CARDIOVERSION  03/24/2022   Procedure: TRANSESOPHAGEAL ECHOCARDIOGRAM (TEE);  Surgeon: Thornton Park, MD;  Location: [place];  Service: Gastroenterology;;   TONSILLECTOMY     "when I was real young"   TOTAL KNEE ARTHROPLASTY Left 10/09/2015   Procedure: TOTAL KNEE ARTHROPLASTY;  Surgeon: Dorna Leitz, MD;  Location: [place];  Service: Orthopedics;  Laterality: Left;   TOTAL KNEE ARTHROPLASTY Right 04/04/2016   Procedure: TOTAL KNEE  ARTHROPLASTY;  Surgeon: Dorna Leitz, MD;  Location: [place];  Service: Orthopedics;  Laterality: Right;    No current facility-administered medications for this encounter.    Allergies as of 06/22/2022 - Review Complete 06/22/2022  Allergen Reaction Noted   Wellbutrin [bupropion] Other (See Comments) 09/25/2015   Amoxicillin Palpitations 11/02/2010    Family History  Problem Relation Age of Onset   Asthma Father    Emphysema Father    Breast cancer Sister    Liver cancer Sister    Asthma Sister    Asthma Brother    Colon cancer Neg Hx    Esophageal cancer Neg Hx      Physical Exam: General:   Alert,  well-nourished, pleasant and cooperative in NAD Head:  Normocephalic and atraumatic. Eyes:  Sclera clear, no icterus.   Conjunctiva pink. Mouth:  No deformity or lesions.   Neck:  Supple; no masses or thyromegaly. Lungs:  Clear throughout to auscultation.   No wheezes. Heart:  Regular rate and rhythm; no murmurs. Abdomen:  Soft, non-tender, nondistended, normal bowel sounds, no rebound or guarding.  Msk:  Symmetrical. No boney deformities LAD: No inguinal or umbilical LAD Extremities:  No clubbing or edema. Neurologic:  Alert and  oriented x4;  grossly nonfocal Skin:  No obvious rash or bruise. Psych:  Alert and cooperative. Normal mood and affect.     Studies/Results: No results found.    Mateen Franssen L. Tarri Glenn, MD, MPH 08/11/2022, 9:50 AM

## 2022-08-11 NOTE — Transfer of Care (Signed)
Immediate Anesthesia Transfer of Care Note  Patient: Aaron [name]  Procedure(s) Performed: ESOPHAGOGASTRODUODENOSCOPY (EGD) WITH PROPOFOL COLONOSCOPY WITH PROPOFOL HOT HEMOSTASIS (ARGON PLASMA COAGULATION/BICAP) POLYPECTOMY  Patient Location: PACU  Anesthesia Type:MAC  Level of Consciousness: awake, alert , oriented, and patient cooperative  Airway & Oxygen Therapy: Patient Spontanous Breathing and Patient connected to face mask oxygen  Post-op Assessment: Report given to RN and Post -op Vital signs reviewed and stable  Post vital signs: Reviewed and stable  Last Vitals:  Vitals Value Taken Time  BP 142/77 08/11/22 1101  Temp 36.6 C 08/11/22 1100  Pulse 64 08/11/22 1105  Resp 19 08/11/22 1105  SpO2 99 % 08/11/22 1105  Vitals shown include unvalidated device data.  Last Pain:  Vitals:   08/11/22 1100  TempSrc: Tympanic  PainSc: Asleep         Complications: No notable events documented.

## 2022-08-11 NOTE — Discharge Instructions (Signed)
YOU HAD AN ENDOSCOPIC PROCEDURE TODAY: Refer to the procedure report and other information in the discharge instructions given to you for any specific questions about what was found during the examination. If this information does not answer your questions, please call [place] office at [phone] to clarify.  ° °YOU SHOULD EXPECT: Some feelings of bloating in the abdomen. Passage of more gas than usual. Walking can help get rid of the air that was put into your GI tract during the procedure and reduce the bloating. If you had a lower endoscopy (such as a colonoscopy or flexible sigmoidoscopy) you may notice spotting of blood in your stool or on the toilet paper. Some abdominal soreness may be present for a day or two, also. ° °DIET: Your first meal following the procedure should be a light meal and then it is ok to progress to your normal diet. A half-sandwich or bowl of soup is an example of a good first meal. Heavy or fried foods are harder to digest and may make you feel nauseous or bloated. Drink plenty of fluids but you should avoid alcoholic beverages for 24 hours. If you had a esophageal dilation, please see attached instructions for diet.   ° °ACTIVITY: Your care partner should take you home directly after the procedure. You should plan to take it easy, moving slowly for the rest of the day. You can resume normal activity the day after the procedure however YOU SHOULD NOT DRIVE, use power tools, machinery or perform tasks that involve climbing or major physical exertion for 24 hours (because of the sedation medicines used during the test).  ° °SYMPTOMS TO REPORT IMMEDIATELY: °A gastroenterologist can be reached at any hour. Please call [phone]  for any of the following symptoms:  °Following lower endoscopy (colonoscopy, flexible sigmoidoscopy) °Excessive amounts of blood in the stool  °Significant tenderness, worsening of abdominal pains  °Swelling of the abdomen that is new, acute  °Fever of 100° or  higher  °Following upper endoscopy (EGD, EUS, ERCP, esophageal dilation) °Vomiting of blood or coffee ground material  °New, significant abdominal pain  °New, significant chest pain or pain under the shoulder blades  °Painful or persistently difficult swallowing  °New shortness of breath  °Black, tarry-looking or red, bloody stools ° °FOLLOW UP:  °If any biopsies were taken you will be contacted by phone or by letter within the next 1-3 weeks. Call [phone]  if you have not heard about the biopsies in 3 weeks.  °Please also call with any specific questions about appointments or follow up tests. ° °

## 2022-08-12 LAB — SURGICAL PATHOLOGY

## 2022-08-14 ENCOUNTER — Encounter: Payer: Self-pay | Admitting: Gastroenterology

## 2022-08-15 ENCOUNTER — Other Ambulatory Visit: Payer: Self-pay

## 2022-08-15 ENCOUNTER — Telehealth: Payer: Self-pay

## 2022-08-15 ENCOUNTER — Encounter (HOSPITAL_COMMUNITY): Payer: Self-pay | Admitting: Gastroenterology

## 2022-08-15 DIAGNOSIS — D509 Iron deficiency anemia, unspecified: Secondary | ICD-10-CM

## 2022-08-15 DIAGNOSIS — K31819 Angiodysplasia of stomach and duodenum without bleeding: Secondary | ICD-10-CM

## 2022-08-15 NOTE — Telephone Encounter (Signed)
-----   Message from Thornton Park, MD sent at 08/11/2022 10:57 AM EST ----- Please schedule next EGD with me at the hospital for additional APC treatment. Needs to be at least 3 weeks or more from now.  Thanks.  KLB

## 2022-08-15 NOTE — Telephone Encounter (Addendum)
Left message for pt to call back. Pt scheduled for EGD on 10/17/22 at [facility] with Dr. Tarri Glenn at 8:45 am.

## 2022-08-16 ENCOUNTER — Other Ambulatory Visit: Payer: Self-pay | Admitting: Internal Medicine

## 2022-08-16 DIAGNOSIS — F32A Depression, unspecified: Secondary | ICD-10-CM

## 2022-08-16 DIAGNOSIS — F411 Generalized anxiety disorder: Secondary | ICD-10-CM

## 2022-08-16 DIAGNOSIS — G4700 Insomnia, unspecified: Secondary | ICD-10-CM

## 2022-08-17 NOTE — Telephone Encounter (Signed)
Left message for pt to call back  °

## 2022-08-22 NOTE — Telephone Encounter (Signed)
Pt made aware of Dr. Tarri Glenn recommendations. Pt was notified that he had been  scheduled for the EGD at [facility] on 10/17/2022 at 8:45 am with Dr. Tarri Glenn Pt was notified that he has been scheduled for a previsit appointment on 09/21/2022 at 2:30 PM. Pt made aware. Location provided. Pt verbalized understanding with all questions answered.

## 2022-08-22 NOTE — Telephone Encounter (Signed)
Left message for pt to call back  °

## 2022-08-29 ENCOUNTER — Ambulatory Visit: Payer: Medicare Other | Admitting: Family Medicine

## 2022-09-08 DIAGNOSIS — I739 Peripheral vascular disease, unspecified: Secondary | ICD-10-CM | POA: Diagnosis not present

## 2022-09-08 DIAGNOSIS — L84 Corns and callosities: Secondary | ICD-10-CM | POA: Diagnosis not present

## 2022-09-08 DIAGNOSIS — L603 Nail dystrophy: Secondary | ICD-10-CM | POA: Diagnosis not present

## 2022-09-14 DIAGNOSIS — H40012 Open angle with borderline findings, low risk, left eye: Secondary | ICD-10-CM | POA: Diagnosis not present

## 2022-09-14 DIAGNOSIS — H353121 Nonexudative age-related macular degeneration, left eye, early dry stage: Secondary | ICD-10-CM | POA: Diagnosis not present

## 2022-09-14 DIAGNOSIS — Z970 Presence of artificial eye: Secondary | ICD-10-CM | POA: Diagnosis not present

## 2022-09-14 DIAGNOSIS — Z961 Presence of intraocular lens: Secondary | ICD-10-CM | POA: Diagnosis not present

## 2022-09-20 ENCOUNTER — Inpatient Hospital Stay: Payer: Medicare Other | Admitting: Internal Medicine

## 2022-09-20 ENCOUNTER — Inpatient Hospital Stay: Payer: Medicare Other | Attending: Internal Medicine

## 2022-09-20 ENCOUNTER — Encounter: Payer: Self-pay | Admitting: Physician Assistant

## 2022-09-20 VITALS — BP 145/63 | HR 70 | Temp 97.9°F | Resp 16 | Wt 240.6 lb

## 2022-09-20 DIAGNOSIS — D508 Other iron deficiency anemias: Secondary | ICD-10-CM

## 2022-09-20 DIAGNOSIS — I10 Essential (primary) hypertension: Secondary | ICD-10-CM | POA: Insufficient documentation

## 2022-09-20 DIAGNOSIS — D500 Iron deficiency anemia secondary to blood loss (chronic): Secondary | ICD-10-CM | POA: Diagnosis not present

## 2022-09-20 DIAGNOSIS — K31819 Angiodysplasia of stomach and duodenum without bleeding: Secondary | ICD-10-CM | POA: Diagnosis not present

## 2022-09-20 DIAGNOSIS — Z85828 Personal history of other malignant neoplasm of skin: Secondary | ICD-10-CM | POA: Diagnosis not present

## 2022-09-20 LAB — CBC WITH DIFFERENTIAL (CANCER CENTER ONLY)
Abs Immature Granulocytes: 0.06 10*3/uL (ref 0.00–0.07)
Basophils Absolute: 0.1 10*3/uL (ref 0.0–0.1)
Basophils Relative: 1 %
Eosinophils Absolute: 0.2 10*3/uL (ref 0.0–0.5)
Eosinophils Relative: 3 %
HCT: 37.2 % — ABNORMAL LOW (ref 39.0–52.0)
Hemoglobin: 12.4 g/dL — ABNORMAL LOW (ref 13.0–17.0)
Immature Granulocytes: 1 %
Lymphocytes Relative: 17 %
Lymphs Abs: 1.2 10*3/uL (ref 0.7–4.0)
MCH: 29.5 pg (ref 26.0–34.0)
MCHC: 33.3 g/dL (ref 30.0–36.0)
MCV: 88.6 fL (ref 80.0–100.0)
Monocytes Absolute: 0.7 10*3/uL (ref 0.1–1.0)
Monocytes Relative: 10 %
Neutro Abs: 4.8 10*3/uL (ref 1.7–7.7)
Neutrophils Relative %: 68 %
Platelet Count: 226 10*3/uL (ref 150–400)
RBC: 4.2 MIL/uL — ABNORMAL LOW (ref 4.22–5.81)
RDW: 12.7 % (ref 11.5–15.5)
WBC Count: 7.1 10*3/uL (ref 4.0–10.5)
nRBC: 0 % (ref 0.0–0.2)

## 2022-09-20 LAB — FERRITIN: Ferritin: 67 ng/mL (ref 24–336)

## 2022-09-20 LAB — IRON AND IRON BINDING CAPACITY (CC-WL,HP ONLY)
Iron: 66 ug/dL (ref 45–182)
Saturation Ratios: 23 % (ref 17.9–39.5)
TIBC: 293 ug/dL (ref 250–450)
UIBC: 227 ug/dL (ref 117–376)

## 2022-09-20 LAB — VITAMIN B12: Vitamin B-12: 369 pg/mL (ref 180–914)

## 2022-09-20 LAB — FOLATE: Folate: 23.8 ng/mL (ref >5.9)

## 2022-09-20 NOTE — Progress Notes (Signed)
[place] [phone]   [phone]  OFFICE PROGRESS NOTE  [name], MD [place] [id number]  DIAGNOSIS: Microcytic anemia secondary to iron deficiency secondary to gastrointestinal hemorrhage.  PRIOR THERAPY: Iron infusion with Venofer 300 Mg IV weekly on as-needed basis.  CURRENT THERAPY: Ferrous sulfate 325 mg p.o. daily.  INTERVAL HISTORY: Aaron [name] 77 y.o. male returns to the clinic today for follow-up visit.  The patient is feeling fine today with no concerning complaints.  He denied having any fatigue or weakness.  He has no nausea, vomiting, diarrhea or constipation.  He has no headache or visual changes.  He has no chest pain, shortness of breath, cough or hemoptysis.  He continues to tolerate the oral iron tablet fairly well.  He is here for evaluation and repeat CBC, iron study and ferritin.   MEDICAL HISTORY: Past Medical History:  Diagnosis Date   Anemia    "@ birth"   Anxiety    Blood transfusion    "@ birth"   Cancer [facility])    Head - skin cancer   Constipation due to pain medication    Coronary artery disease    DES LAD 2005   Depression    Full dentures    GERD (gastroesophageal reflux disease)    Headache(784.0)    "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; 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"   Heart murmur    Hyperlipidemia    Hypertension    Insomnia    Osteoarthritis    PONV (postoperative nausea and vomiting)    2006 - knee surgery   Pre-diabetes    Psoriasis    PTSD (post-traumatic stress disorder)    Pulmonary embolism ([place]) ~ 03/2006   bilaterally   Seasonal allergies    Shortness of breath 06/08/11   "w/exertion; that's why I'm here"   Sleep apnea, obstructive    Umbilical hernia     ALLERGIES:  is allergic to wellbutrin [bupropion] and amoxicillin.  MEDICATIONS:  Current Outpatient Medications  Medication Sig Dispense Refill   amLODipine (NORVASC) 5 MG tablet TAKE 1 TABLET BY MOUTH EVERYDAY AT  BEDTIME 90 tablet 0   ascorbic acid (C 500/ROSE HIPS) 500 MG tablet Take 500 mg by mouth daily.     aspirin EC 81 MG tablet Take 1 tablet (81 mg total) by mouth daily. Swallow whole. 30 tablet 0   atorvastatin (LIPITOR) 80 MG tablet TAKE 1 TABLET BY MOUTH ONCE  DAILY 100 tablet 1   Calcium Carb-Cholecalciferol (CALCIUM 500 + D PO) Take 1 tablet by mouth daily.     cholecalciferol (VITAMIN D3) 25 MCG (1000 UNIT) tablet Take 1,000 Units by mouth daily.     clonazePAM (KLONOPIN) 0.5 MG tablet TAKE 1 TABLET BY MOUTH EVERYDAY AT BEDTIME 90 tablet 3   ezetimibe (ZETIA) 10 MG tablet TAKE 1 TABLET BY MOUTH  DAILY 90 tablet 3   ferrous sulfate 325 (65 FE) MG EC tablet TAKE 1 TABLET BY MOUTH DAILY WITH BREAKFAST. SCHEDULE AN APPOINTMENT FOR FURTHER REFILLS 90 tablet 3   furosemide (LASIX) 40 MG tablet Take 1 tablet (40 mg total) by mouth daily. 90 tablet 3   hydrALAZINE (APRESOLINE) 25 MG tablet Take 1 tablet (25 mg total) by mouth 3 (three) times daily. 270 tablet 3   neomycin-bacitracin-polymyxin (NEOSPORIN) OINT Apply 1 Application topically as needed for wound care.     pantoprazole (PROTONIX) 40 MG tablet TAKE 1 TABLET BY MOUTH  EVERY DAY 90 tablet 0   polyethylene glycol (MIRALAX / GLYCOLAX) 17 g packet Take 17 g by mouth daily. With coffee     spironolactone (ALDACTONE) 25 MG tablet Take 1 tablet (25 mg total) by mouth daily. 90 tablet 3   traMADol (ULTRAM) 50 MG tablet Take 2 tablets (100 mg total) by mouth every 8 (eight) hours as needed for moderate pain. 540 tablet 1   traZODone (DESYREL) 50 MG tablet TAKE 1 TABLET BY MOUTH EVERYDAY AT BEDTIME 90 tablet 1   venlafaxine XR (EFFEXOR-XR) 150 MG 24 hr capsule TAKE 1 CAPSULE BY MOUTH DAILY  WITH BREAKFAST 100 capsule 0   No current facility-administered medications for this visit.    SURGICAL HISTORY:  Past Surgical History:  Procedure Laterality Date   BUBBLE STUDY  03/24/2022   Procedure: BUBBLE STUDY;  Surgeon: Thornton Park, MD;   Location: [place];  Service: Gastroenterology;;   CARDIAC CATHETERIZATION     carpel tunnel release Bilateral ~ 06/2005/~ 08/2005   right/left   COLONOSCOPY W/ BIOPSIES AND POLYPECTOMY     COLONOSCOPY WITH PROPOFOL N/A 08/11/2022   Procedure: COLONOSCOPY WITH PROPOFOL;  Surgeon: Thornton Park, MD;  Location: [facility];  Service: Gastroenterology;  Laterality: N/A;   CORONARY ANGIOPLASTY     CORONARY STENT PLACEMENT  02/13/2004   1   ENUCLEATION Right    age 70   ESOPHAGOGASTRODUODENOSCOPY (EGD) WITH PROPOFOL N/A 03/24/2022   Procedure: ESOPHAGOGASTRODUODENOSCOPY (EGD) WITH PROPOFOL;  Surgeon: Thornton Park, MD;  Location: [place];  Service: Gastroenterology;  Laterality: N/A;   ESOPHAGOGASTRODUODENOSCOPY (EGD) WITH PROPOFOL N/A 08/11/2022   Procedure: ESOPHAGOGASTRODUODENOSCOPY (EGD) WITH PROPOFOL;  Surgeon: Thornton Park, MD;  Location: [facility];  Service: Gastroenterology;  Laterality: N/A;   EYE SURGERY     age 18; "ruptured  right pupil"   EYE SURGERY     age 7   [place]   placed artificial right eye   HEMOSTASIS CONTROL  03/24/2022   Procedure: HEMOSTASIS CONTROL;  Surgeon: Thornton Park, MD;  Location: [place];  Service: Gastroenterology;;   HOT HEMOSTASIS N/A 03/24/2022   Procedure: HOT HEMOSTASIS (ARGON PLASMA COAGULATION/BICAP);  Surgeon: Thornton Park, MD;  Location: [place];  Service: Gastroenterology;  Laterality: N/A;   HOT HEMOSTASIS N/A 08/11/2022   Procedure: HOT HEMOSTASIS (ARGON PLASMA COAGULATION/BICAP);  Surgeon: Thornton Park, MD;  Location: [facility];  Service: Gastroenterology;  Laterality: N/A;  stomach   INGUINAL HERNIA REPAIR Right 1983   INGUINAL HERNIA REPAIR Left 1985   IR IVC FILTER PLMT / S&I /IMG GUID/MOD SED  03/29/2022   KNEE ARTHROSCOPY  ~ 06/2005   right   LEFT HEART CATH AND CORONARY ANGIOGRAPHY N/A 03/25/2022   Procedure: LEFT HEART CATH AND CORONARY ANGIOGRAPHY;  Surgeon: Early Osmond, MD;   Location: [place] [facility];  Service: Cardiovascular;  Laterality: N/A;   MULTIPLE TOOTH EXTRACTIONS     POLYPECTOMY  08/11/2022   Procedure: POLYPECTOMY;  Surgeon: Thornton Park, MD;  Location: [facility];  Service: Gastroenterology;;  colon   spinal injections  2006-2007   "maybe 10 injections for herniated discs"   TEE WITHOUT CARDIOVERSION  03/24/2022   Procedure: TRANSESOPHAGEAL ECHOCARDIOGRAM (TEE);  Surgeon: Thornton Park, MD;  Location: [place];  Service: Gastroenterology;;   TONSILLECTOMY     "when I was real young"   TOTAL KNEE ARTHROPLASTY Left 10/09/2015   Procedure: TOTAL KNEE ARTHROPLASTY;  Surgeon: Dorna Leitz, MD;  Location: [place];  Service: Orthopedics;  Laterality: Left;  TOTAL KNEE ARTHROPLASTY Right 04/04/2016   Procedure: TOTAL KNEE ARTHROPLASTY;  Surgeon: Dorna Leitz, MD;  Location: [place];  Service: Orthopedics;  Laterality: Right;    REVIEW OF SYSTEMS:  A comprehensive review of systems was negative.   PHYSICAL EXAMINATION: General appearance: alert, cooperative, and no distress Head: Normocephalic, without obvious abnormality, atraumatic Neck: no adenopathy, no JVD, supple, symmetrical, trachea midline, and thyroid not enlarged, symmetric, no tenderness/mass/nodules Lymph nodes: Cervical, supraclavicular, and axillary nodes normal. Resp: clear to auscultation bilaterally Back: symmetric, no curvature. ROM normal. No CVA tenderness. Cardio: regular rate and rhythm, S1, S2 normal, no murmur, click, rub or gallop GI: soft, non-tender; bowel sounds normal; no masses,  no organomegaly Extremities: extremities normal, atraumatic, no cyanosis or edema  ECOG PERFORMANCE STATUS: 1 - Symptomatic but completely ambulatory  Blood pressure (!) 145/63, pulse 70, temperature 97.9 F (36.6 C), temperature source Oral, resp. rate 16, weight 240 lb 9 oz (109.1 kg), SpO2 98 %.  LABORATORY DATA: Lab Results  Component Value Date   WBC 7.1 09/20/2022   HGB 12.4 (L)  09/20/2022   HCT 37.2 (L) 09/20/2022   MCV 88.6 09/20/2022   PLT 226 09/20/2022      Chemistry      Component Value Date/Time   NA 139 04/18/2022 1509   K 4.7 04/18/2022 1509   CL 102 04/18/2022 1509   CO2 19 (L) 04/18/2022 1509   BUN 24 04/18/2022 1509   CREATININE 1.07 04/18/2022 1509   CREATININE 1.24 06/15/2020 1306   CREATININE 1.16 02/10/2020 1532      Component Value Date/Time   CALCIUM 9.1 04/18/2022 1509   ALKPHOS 71 03/24/2022 0415   AST 31 03/24/2022 0415   AST 17 06/15/2020 1306   ALT 22 03/24/2022 0415   ALT 12 06/15/2020 1306   BILITOT 0.2 (L) 03/24/2022 0415   BILITOT 0.3 12/17/2020 1017   BILITOT 0.4 06/15/2020 1306       RADIOGRAPHIC STUDIES: No results found.  ASSESSMENT AND PLAN: This is a very pleasant 77 years old white male with history of iron deficiency anemia secondary to chronic gastrointestinal blood loss. The patient is currently on oral iron tablet with ferrous sulfate 325 mg p.o. daily and tolerating this treatment well. The patient was found recently to have significant iron deficiency anemia. He has significant gastric antral vascular ectasia with acute blood loss few weeks ago. The patient received 3 units of PRBCs transfusion during his hospitalization. He was treated with iron infusion with Venofer at the [place] [facility] center and tolerated it fairly well. The patient is currently on oral iron tablets and he is tolerating it fairly well with no concerning complaints. Repeat CBC today showed hemoglobin of 12.4 and hematocrit 37.2%.  Iron studies showed serum iron of 66, iron saturation 23%. Ferritin level is still pending. I recommended for the patient to continue his current treatment with ferrous sulfate for now. I will see him back for follow-up visit in 6 months for evaluation and repeat blood work. The patient was advised to call immediately if he has any concerning symptoms in the interval. The patient voices understanding  of current disease status and treatment options and is in agreement with the current care plan.  All questions were answered. The patient knows to call the clinic with any problems, questions or concerns. We can certainly see the patient much sooner if necessary.  The total time spent in the appointment was 20 minutes.  Disclaimer: This note was dictated  with voice recognition software. Similar sounding words can inadvertently be transcribed and may not be corrected upon review.

## 2022-09-21 ENCOUNTER — Ambulatory Visit (AMBULATORY_SURGERY_CENTER): Payer: Medicare Other | Admitting: *Deleted

## 2022-09-21 VITALS — Ht 66.0 in | Wt 240.0 lb

## 2022-09-21 DIAGNOSIS — K31819 Angiodysplasia of stomach and duodenum without bleeding: Secondary | ICD-10-CM

## 2022-09-21 NOTE — Progress Notes (Signed)
Pt's previsit is done over the phone and all paperwork (prep instructions) sent to patient. Pt's name and DOB verified at the beginning of the previsit. Pt denies any difficulty with ambulating.  No egg or soy allergy known to patient  No issues known to pt with past sedation with any surgeries or procedures Patient denies issues being intubated No FH of Malignant Hyperthermia Pt is not on diet pills Pt is not on  home 02  Pt is not on blood thinners  Pt denies issues with constipation  Pt is not on dialysis Pt denies any upcoming cardiac testing Pt encouraged to use to use Singlecare or Goodrx to reduce cost  Patient's chart reviewed by Osvaldo Angst CNRA prior to previsit and patient appropriate for the [place].  Previsit completed and red dot placed by patient's name on their procedure day (on provider's schedule).  . Visit by phone Pt states her weight is 240lb Pt informed RN from [facility] will contact him r/t his procedure to give him more instruction. Pt states he understands Pt has a R prosthetic eye he lost at age 77 Instructions reviewed with pt and pt states understanding. Instructed to review again prior to procedure. Pt states they will.  Instructions sent by mail with coupon and by my chart

## 2022-10-09 ENCOUNTER — Other Ambulatory Visit: Payer: Self-pay | Admitting: Cardiology

## 2022-10-10 ENCOUNTER — Encounter (HOSPITAL_COMMUNITY): Payer: Self-pay | Admitting: Gastroenterology

## 2022-10-16 ENCOUNTER — Other Ambulatory Visit: Payer: Self-pay | Admitting: Internal Medicine

## 2022-10-16 DIAGNOSIS — I10 Essential (primary) hypertension: Secondary | ICD-10-CM

## 2022-10-16 NOTE — Anesthesia Preprocedure Evaluation (Signed)
Anesthesia Evaluation    Reviewed: Allergy & Precautions, H&P , Patient's Chart, lab work & pertinent test results  History of Anesthesia Complications (+) PONV and history of anesthetic complications  Airway Mallampati: II  TM Distance: >3 FB Neck ROM: Full    Dental no notable dental hx. (+) Edentulous Upper, Edentulous Lower   Pulmonary neg pulmonary ROS, shortness of breath, asthma , sleep apnea , former smoker   Pulmonary exam normal breath sounds clear to auscultation       Cardiovascular Exercise Tolerance: Good hypertension, Pt. on medications + CAD  negative cardio ROS Normal cardiovascular exam+ Valvular Problems/Murmurs  Rhythm:Regular Rate:Normal  CATH 9/21   Dist LAD lesion is 10% stenosed.   1st Mrg lesion is 30% stenosed.   1.  Diffuse mild to moderate obstructive disease with patent mid LAD stent. 2.  Elevated LVEDP of 25 mmHg.  ECHO 9/23 1. Study not well visualized to full assess endocardial borders. Left  ventricular ejection fraction, by estimation, is 55 to 60%. The left  ventricle has normal function. The left ventricle has no regional wall  motion abnormalities. There is mild left  ventricular hypertrophy. Left ventricular diastolic parameters were  normal.   2. Right ventricular systolic function is normal. The right ventricular  size is normal. There is normal pulmonary artery systolic pressure.   3. The mitral valve is normal in structure. No evidence of mitral valve  regurgitation.   4. The aortic valve was not well visualized. Aortic valve regurgitation  is not visualized.   5. The inferior vena cava is normal in size with greater than 50%  respiratory variability, suggesting right atrial pressure of 3 mmHg.     Neuro/Psych  Headaches PSYCHIATRIC DISORDERS Anxiety Depression    negative neurological ROS  negative psych ROS   GI/Hepatic negative GI ROS, Neg liver ROS,GERD  Medicated,,   Endo/Other  negative endocrine ROS    Renal/GU negative Renal ROS  negative genitourinary   Musculoskeletal  (+) Arthritis ,    Abdominal   Peds  Hematology negative hematology ROS (+) Blood dyscrasia, anemia   Anesthesia Other Findings   Reproductive/Obstetrics negative OB ROS                             Anesthesia Physical Anesthesia Plan  ASA: 3  Anesthesia Plan: MAC   Post-op Pain Management: Minimal or no pain anticipated   Induction: Intravenous  PONV Risk Score and Plan: Propofol infusion and Treatment may vary due to age or medical condition  Airway Management Planned: Natural Airway and Nasal Cannula  Additional Equipment: None  Intra-op Plan:   Post-operative Plan:   Informed Consent: I have reviewed the patients History and Physical, chart, labs and discussed the procedure including the risks, benefits and alternatives for the proposed anesthesia with the patient or authorized representative who has indicated his/her understanding and acceptance.     Dental advisory given  Plan Discussed with: CRNA and Anesthesiologist  Anesthesia Plan Comments: (Fe Deficiency Anemia, Colon Polyps GAVE)        Anesthesia Quick Evaluation

## 2022-10-17 ENCOUNTER — Ambulatory Visit (HOSPITAL_COMMUNITY)
Admission: RE | Admit: 2022-10-17 | Discharge: 2022-10-17 | Disposition: A | Discharge status: Home / Self Care | Payer: Medicare Other | Attending: Gastroenterology | Admitting: Gastroenterology

## 2022-10-17 ENCOUNTER — Other Ambulatory Visit: Payer: Self-pay

## 2022-10-17 ENCOUNTER — Encounter (HOSPITAL_COMMUNITY)
Admission: RE | Disposition: A | Discharge status: Home / Self Care | Payer: Self-pay | Source: Home / Self Care | Attending: Gastroenterology

## 2022-10-17 ENCOUNTER — Ambulatory Visit (HOSPITAL_COMMUNITY): Payer: Medicare Other | Admitting: Certified Registered Nurse Anesthetist

## 2022-10-17 ENCOUNTER — Ambulatory Visit (HOSPITAL_BASED_OUTPATIENT_CLINIC_OR_DEPARTMENT_OTHER): Payer: Medicare Other | Admitting: Certified Registered Nurse Anesthetist

## 2022-10-17 ENCOUNTER — Encounter (HOSPITAL_COMMUNITY): Payer: Self-pay | Admitting: Gastroenterology

## 2022-10-17 DIAGNOSIS — I2510 Atherosclerotic heart disease of native coronary artery without angina pectoris: Secondary | ICD-10-CM

## 2022-10-17 DIAGNOSIS — D509 Iron deficiency anemia, unspecified: Secondary | ICD-10-CM

## 2022-10-17 DIAGNOSIS — K219 Gastro-esophageal reflux disease without esophagitis: Secondary | ICD-10-CM | POA: Insufficient documentation

## 2022-10-17 DIAGNOSIS — K317 Polyp of stomach and duodenum: Secondary | ICD-10-CM | POA: Insufficient documentation

## 2022-10-17 DIAGNOSIS — I10 Essential (primary) hypertension: Secondary | ICD-10-CM | POA: Insufficient documentation

## 2022-10-17 DIAGNOSIS — G4733 Obstructive sleep apnea (adult) (pediatric): Secondary | ICD-10-CM | POA: Insufficient documentation

## 2022-10-17 DIAGNOSIS — K31819 Angiodysplasia of stomach and duodenum without bleeding: Secondary | ICD-10-CM | POA: Diagnosis not present

## 2022-10-17 DIAGNOSIS — D500 Iron deficiency anemia secondary to blood loss (chronic): Secondary | ICD-10-CM | POA: Diagnosis not present

## 2022-10-17 DIAGNOSIS — Z87891 Personal history of nicotine dependence: Secondary | ICD-10-CM

## 2022-10-17 HISTORY — PX: HOT HEMOSTASIS: SHX5433

## 2022-10-17 HISTORY — PX: BIOPSY: SHX5522

## 2022-10-17 HISTORY — PX: ESOPHAGOGASTRODUODENOSCOPY (EGD) WITH PROPOFOL: SHX5813

## 2022-10-17 LAB — GLUCOSE, CAPILLARY: Glucose-Capillary: 89 mg/dL (ref 70–99)

## 2022-10-17 SURGERY — ESOPHAGOGASTRODUODENOSCOPY (EGD) WITH PROPOFOL
Anesthesia: Monitor Anesthesia Care

## 2022-10-17 MED ORDER — PROPOFOL 1000 MG/100ML IV EMUL
INTRAVENOUS | Status: AC
  Filled 2022-10-17: qty 100

## 2022-10-17 MED ORDER — PROPOFOL 10 MG/ML IV BOLUS
INTRAVENOUS | Status: DC | PRN
  Administered 2022-10-17: 20 mg via INTRAVENOUS
  Administered 2022-10-17: 40 mg via INTRAVENOUS

## 2022-10-17 MED ORDER — LACTATED RINGERS IV SOLN: INTRAVENOUS | Status: DC | PRN

## 2022-10-17 MED ORDER — LIDOCAINE 2% (20 MG/ML) 5 ML SYRINGE
INTRAMUSCULAR | Status: DC | PRN
  Administered 2022-10-17: 100 mg via INTRAVENOUS

## 2022-10-17 MED ORDER — PROPOFOL 500 MG/50ML IV EMUL
INTRAVENOUS | Status: DC | PRN
  Administered 2022-10-17: 125 ug/kg/min via INTRAVENOUS

## 2022-10-17 MED ORDER — SODIUM CHLORIDE 0.9 % IV SOLN: INTRAVENOUS | Status: DC

## 2022-10-17 MED ORDER — LACTATED RINGERS IV SOLN: Freq: Once | INTRAVENOUS | Status: AC

## 2022-10-17 SURGICAL SUPPLY — 15 items

## 2022-10-17 NOTE — Op Note (Signed)
[facility] Patient Name: Aaron [name] Procedure Date: 10/17/2022 MRN: [id number] Attending MD: Tressia Danas MD, MD, [id number] Date of Birth: Jul 26, 1945 CSN: [id number] Age: 77 Admit Type: Outpatient Procedure:                Upper GI endoscopy Indications:              Watermelon stomach (GAVE syndrome), last treated                            with APC 08/11/22 Providers:                Tressia Danas MD, MD, Pollie Friar RN, RN,                            Sunday Corn Mbumina, Technician Referring MD:              Medicines:                Monitored Anesthesia Care Complications:            No immediate complications. Estimated Blood Loss:     Estimated blood loss was minimal. Procedure:                Pre-Anesthesia Assessment:                           - Prior to the procedure, a History and Physical                            was performed, and patient medications and                            allergies were reviewed. The patient's tolerance of                            previous anesthesia was also reviewed. The risks                            and benefits of the procedure and the sedation                            options and risks were discussed with the patient.                            All questions were answered, and informed consent                            was obtained. Prior Anticoagulants: The patient has                            taken no anticoagulant or antiplatelet agents. ASA                            Grade Assessment: III - A patient with severe  systemic disease. After reviewing the risks and                            benefits, the patient was deemed in satisfactory                            condition to undergo the procedure.                           After obtaining informed consent, the endoscope was                            passed under direct vision. Throughout the                             procedure, the patient's blood pressure, pulse, and                            oxygen saturations were monitored continuously. The                            [id number]-[id number] ([id number]) Olympus endoscope was introduced                            through the mouth, and advanced to the second part                            of duodenum. The upper GI endoscopy was                            accomplished without difficulty. The patient                            tolerated the procedure well. Scope In: Scope Out: Findings:      The esophagus was normal.      Mild gastric antral vascular ectasia without bleeding was present in the       gastric antrum and in the prepyloric region of the stomach. Coagulation       for bleeding prevention using argon plasma was successful.      Multiple small flat polyps with no bleeding and no stigmata of recent       bleeding were found in the cardia. Biopsies were taken with a cold       forceps for histology. Estimated blood loss was minimal.      The examined duodenum was normal. Impression:               - Normal esophagus.                           - Gastric antral vascular ectasia without bleeding.                            Treated with argon plasma coagulation (APC).                           -  Multiple gastric polyps. Biopsied.                           - Normal examined duodenum. Moderate Sedation:      Not Applicable - Patient had care per Anesthesia. Recommendation:           - Patient has a contact number available for                            emergencies. The signs and symptoms of potential                            delayed complications were discussed with the                            patient. Return to normal activities tomorrow.                            Written discharge instructions were provided to the                            patient.                           - Resume previous diet.                           - Continue present  medications.                           - Await pathology results.                           - Office follow-up in 4-6 weeks.                           - Consider repeat APC treatment with evidence for                            progressive anemia. Procedure Code(s):        --- Professional ---                           43255, 59, Esophagogastroduodenoscopy, flexible,                            transoral; with control of bleeding, any method                           43239, 51, Esophagogastroduodenoscopy, flexible,                            transoral; with biopsy, single or multiple Diagnosis Code(s):        --- Professional ---                           K31.819, Angiodysplasia of stomach and duodenum  without bleeding                           K31.7, Polyp of stomach and duodenum CPT copyright 2022 [employer]. All rights reserved. The codes documented in this report are preliminary and upon coder review may  be revised to meet current compliance requirements. Tressia Danas MD, MD 10/17/2022 9:18:57 AM This report has been signed electronically. Number of Addenda: 0

## 2022-10-17 NOTE — H&P (Signed)
Referring Provider: No ref. provider found Primary Care Physician:  [name], MD  Indication for Procedure:  GAVE   IMPRESSION:  GAVE  PLAN: EGD   HPI: Aaron [name] is a 77 y.o. male presents for additional treatment of GAVE. Last EGD 08/11/22.   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"   Heart murmur    Hyperlipidemia    Hypertension    Insomnia    Osteoarthritis    PONV (postoperative nausea and vomiting)    2006 - knee surgery   Pre-diabetes    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63   Psoriasis    PTSD (post-traumatic stress disorder)    Pulmonary embolism ~ 03/2006   bilaterally   Seasonal allergies    Shortness of breath 06/08/2011   "w/exertion; that's why I'm here"   Sleep apnea    Sleep apnea, obstructive    Umbilical hernia     Past Surgical History:  Procedure Laterality Date   BUBBLE STUDY  03/24/2022   Procedure: BUBBLE STUDY;  Surgeon: Tressia Danas, MD;  Location: [facility] ENDOSCOPY;  Service: Gastroenterology;;   CARDIAC CATHETERIZATION     carpel tunnel release Bilateral ~ 06/2005/~ 08/2005   right/left   COLONOSCOPY W/ BIOPSIES AND POLYPECTOMY     COLONOSCOPY WITH PROPOFOL N/A 08/11/2022   Procedure: COLONOSCOPY WITH PROPOFOL;  Surgeon: Tressia Danas, MD;  Location: [facility] ENDOSCOPY;  Service: Gastroenterology;  Laterality: N/A;   CORONARY ANGIOPLASTY     CORONARY STENT PLACEMENT  02/13/2004   1   ENUCLEATION Right    age 25   ESOPHAGOGASTRODUODENOSCOPY (EGD) WITH PROPOFOL N/A 03/24/2022    Procedure: ESOPHAGOGASTRODUODENOSCOPY (EGD) WITH PROPOFOL;  Surgeon: Tressia Danas, MD;  Location: [facility] ENDOSCOPY;  Service: Gastroenterology;  Laterality: N/A;   ESOPHAGOGASTRODUODENOSCOPY (EGD) WITH PROPOFOL N/A 08/11/2022   Procedure: ESOPHAGOGASTRODUODENOSCOPY (EGD) WITH PROPOFOL;  Surgeon: Tressia Danas, MD;  Location: [facility] ENDOSCOPY;  Service: Gastroenterology;  Laterality: N/A;   EYE SURGERY     age 36; "ruptured  right pupil"   EYE SURGERY     age 48   EYE SURGERY  1957   placed artificial right eye   HEMOSTASIS CONTROL  03/24/2022   Procedure: HEMOSTASIS CONTROL;  Surgeon: Tressia Danas, MD;  Location: [facility] ENDOSCOPY;  Service: Gastroenterology;;   HOT HEMOSTASIS N/A 03/24/2022   Procedure: HOT HEMOSTASIS (ARGON PLASMA COAGULATION/BICAP);  Surgeon: Tressia Danas, MD;  Location: [facility] ENDOSCOPY;  Service: Gastroenterology;  Laterality: N/A;   HOT HEMOSTASIS N/A 08/11/2022   Procedure: HOT HEMOSTASIS (ARGON PLASMA COAGULATION/BICAP);  Surgeon: Tressia Danas, MD;  Location: [facility] ENDOSCOPY;  Service: Gastroenterology;  Laterality: N/A;  stomach   INGUINAL HERNIA REPAIR Right 1983   INGUINAL HERNIA REPAIR Left 1985   IR IVC FILTER PLMT / S&I /IMG GUID/MOD SED  03/29/2022   KNEE ARTHROSCOPY  ~ 06/2005  right   LEFT HEART CATH AND CORONARY ANGIOGRAPHY N/A 03/25/2022   Procedure: LEFT HEART CATH AND CORONARY ANGIOGRAPHY;  Surgeon: Orbie Pyo, MD;  Location: [facility] INVASIVE CV LAB;  Service: Cardiovascular;  Laterality: N/A;   MULTIPLE TOOTH EXTRACTIONS     POLYPECTOMY  08/11/2022   Procedure: POLYPECTOMY;  Surgeon: Tressia Danas, MD;  Location: [facility] ENDOSCOPY;  Service: Gastroenterology;;  colon   spinal injections  2006-2007   "maybe 10 injections for herniated discs"   TEE WITHOUT CARDIOVERSION  03/24/2022   Procedure: TRANSESOPHAGEAL ECHOCARDIOGRAM (TEE);  Surgeon: Tressia Danas, MD;  Location: [facility] ENDOSCOPY;  Service: Gastroenterology;;   TONSILLECTOMY     "when I was real  young"   TOTAL KNEE ARTHROPLASTY Left 10/09/2015   Procedure: TOTAL KNEE ARTHROPLASTY;  Surgeon: Jodi Geralds, MD;  Location: [facility] OR;  Service: Orthopedics;  Laterality: Left;   TOTAL KNEE ARTHROPLASTY Right 04/04/2016   Procedure: TOTAL KNEE ARTHROPLASTY;  Surgeon: Jodi Geralds, MD;  Location: [facility] OR;  Service: Orthopedics;  Laterality: Right;    Current Facility-Administered Medications  Medication Dose Route Frequency Provider Last Rate Last Admin   0.9 %  sodium chloride infusion   Intravenous Continuous Tressia Danas, MD        Allergies as of 08/15/2022 - Review Complete 08/11/2022  Allergen Reaction Noted   Wellbutrin [bupropion] Other (See Comments) 09/25/2015   Amoxicillin Palpitations 11/02/2010    Family History  Problem Relation Age of Onset   Asthma Father    Emphysema Father    Breast cancer Sister    Liver cancer Sister    Asthma Sister    Asthma Brother    Colon cancer Neg Hx    Esophageal cancer Neg Hx    Colon polyps Neg Hx    Rectal cancer Neg Hx    Stomach cancer Neg Hx      Physical Exam: General:   Alert,  well-nourished, pleasant and cooperative in NAD Head:  Normocephalic and atraumatic. Eyes:  Sclera clear, no icterus.   Conjunctiva pink. Mouth:  No deformity or lesions.   Neck:  Supple; no masses or thyromegaly. Lungs:  Clear throughout to auscultation.   No wheezes. Heart:  Regular rate and rhythm; no murmurs. Abdomen:  Soft, non-tender, nondistended, normal bowel sounds, no rebound or guarding.  Msk:  Symmetrical. No boney deformities LAD: No inguinal or umbilical LAD Extremities:  No clubbing or edema. Neurologic:  Alert and  oriented x4;  grossly nonfocal Skin:  No obvious rash or bruise. Psych:  Alert and cooperative. Normal mood and affect.     Studies/Results: No results found.    Bobbette Eakes L. Orvan Falconer, MD, MPH 10/17/2022, 8:24 AM

## 2022-10-17 NOTE — Transfer of Care (Signed)
Immediate Anesthesia Transfer of Care Note  Patient: Aaron [name]  Procedure(s) Performed: ESOPHAGOGASTRODUODENOSCOPY (EGD) WITH PROPOFOL HOT HEMOSTASIS (ARGON PLASMA COAGULATION/BICAP)  Patient Location: PACU and Endoscopy Unit  Anesthesia Type:MAC  Level of Consciousness: awake, alert , and patient cooperative  Airway & Oxygen Therapy: Patient Spontanous Breathing and Patient connected to nasal cannula oxygen  Post-op Assessment: Report given to RN and Post -op Vital signs reviewed and stable  Post vital signs: Reviewed and stable  Last Vitals:  Vitals Value Taken Time  BP    Temp    Pulse 70 10/17/22 0911  Resp 18 10/17/22 0911  SpO2 100 % 10/17/22 0911  Vitals shown include unvalidated device data.  Last Pain:  Vitals:   10/17/22 0727  TempSrc: Temporal  PainSc: 5          Complications: No notable events documented.

## 2022-10-17 NOTE — Anesthesia Procedure Notes (Signed)
Procedure Name: MAC Date/Time: 10/17/2022 8:48 AM  Performed by: Wynonia Sours, CRNAPre-anesthesia Checklist: Patient identified, Emergency Drugs available, Suction available, Patient being monitored and Timeout performed Patient Re-evaluated:Patient Re-evaluated prior to induction Oxygen Delivery Method: Simple face mask Preoxygenation: Pre-oxygenation with 100% oxygen Placement Confirmation: positive ETCO2 Dental Injury: Teeth and Oropharynx as per pre-operative assessment

## 2022-10-17 NOTE — Discharge Instructions (Signed)
YOU HAD AN ENDOSCOPIC PROCEDURE TODAY: Refer to the procedure report and other information in the discharge instructions given to you for any specific questions about what was found during the examination. If this information does not answer your questions, please call [place] office at [phone] to clarify.  ° °YOU SHOULD EXPECT: Some feelings of bloating in the abdomen. Passage of more gas than usual. Walking can help get rid of the air that was put into your GI tract during the procedure and reduce the bloating. If you had a lower endoscopy (such as a colonoscopy or flexible sigmoidoscopy) you may notice spotting of blood in your stool or on the toilet paper. Some abdominal soreness may be present for a day or two, also. ° °DIET: Your first meal following the procedure should be a light meal and then it is ok to progress to your normal diet. A half-sandwich or bowl of soup is an example of a good first meal. Heavy or fried foods are harder to digest and may make you feel nauseous or bloated. Drink plenty of fluids but you should avoid alcoholic beverages for 24 hours. If you had a esophageal dilation, please see attached instructions for diet.   ° °ACTIVITY: Your care partner should take you home directly after the procedure. You should plan to take it easy, moving slowly for the rest of the day. You can resume normal activity the day after the procedure however YOU SHOULD NOT DRIVE, use power tools, machinery or perform tasks that involve climbing or major physical exertion for 24 hours (because of the sedation medicines used during the test).  ° °SYMPTOMS TO REPORT IMMEDIATELY: °A gastroenterologist can be reached at any hour. Please call [phone]  for any of the following symptoms:  °Following lower endoscopy (colonoscopy, flexible sigmoidoscopy) °Excessive amounts of blood in the stool  °Significant tenderness, worsening of abdominal pains  °Swelling of the abdomen that is new, acute  °Fever of 100° or  higher  °Following upper endoscopy (EGD, EUS, ERCP, esophageal dilation) °Vomiting of blood or coffee ground material  °New, significant abdominal pain  °New, significant chest pain or pain under the shoulder blades  °Painful or persistently difficult swallowing  °New shortness of breath  °Black, tarry-looking or red, bloody stools ° °FOLLOW UP:  °If any biopsies were taken you will be contacted by phone or by letter within the next 1-3 weeks. Call [phone]  if you have not heard about the biopsies in 3 weeks.  °Please also call with any specific questions about appointments or follow up tests. ° °

## 2022-10-18 ENCOUNTER — Encounter (HOSPITAL_COMMUNITY): Payer: Self-pay | Admitting: Gastroenterology

## 2022-10-18 ENCOUNTER — Other Ambulatory Visit: Payer: Self-pay | Admitting: Internal Medicine

## 2022-10-18 DIAGNOSIS — K219 Gastro-esophageal reflux disease without esophagitis: Secondary | ICD-10-CM

## 2022-10-18 LAB — SURGICAL PATHOLOGY

## 2022-10-19 ENCOUNTER — Ambulatory Visit: Payer: Medicare Other | Admitting: Internal Medicine

## 2022-10-21 ENCOUNTER — Telehealth: Payer: Self-pay | Admitting: *Deleted

## 2022-10-21 NOTE — Telephone Encounter (Signed)
-----   Message from Tressia Danas, MD sent at 10/17/2022  9:07 AM EDT ----- Please arrange office follow-up in 4 to 6 weeks.  Thank you.  KLB

## 2022-10-23 NOTE — Anesthesia Postprocedure Evaluation (Signed)
Anesthesia Post Note  Patient: Aaron [name]  Procedure(s) Performed: ESOPHAGOGASTRODUODENOSCOPY (EGD) WITH PROPOFOL HOT HEMOSTASIS (ARGON PLASMA COAGULATION/BICAP) BIOPSY     Patient location during evaluation: PACU Anesthesia Type: MAC Level of consciousness: awake and alert Pain management: pain level controlled Vital Signs Assessment: post-procedure vital signs reviewed and stable Respiratory status: spontaneous breathing, nonlabored ventilation, respiratory function stable and patient connected to nasal cannula oxygen Cardiovascular status: stable and blood pressure returned to baseline Postop Assessment: no apparent nausea or vomiting Anesthetic complications: no  No notable events documented.  Last Vitals:  Vitals:   10/17/22 0930 10/17/22 0940  BP: (!) 160/70 (!) 161/71  Pulse: (!) 57 64  Resp: 16 15  Temp:    SpO2: 98% 98%    Last Pain:  Vitals:   10/18/22 1527  TempSrc:   PainSc: 0-No pain                 Barre Aydelott

## 2022-10-23 NOTE — Anesthesia Postprocedure Evaluation (Signed)
Anesthesia Post Note  Patient: Aaron [name]  Procedure(s) Performed: ESOPHAGOGASTRODUODENOSCOPY (EGD) WITH PROPOFOL HOT HEMOSTASIS (ARGON PLASMA COAGULATION/BICAP) BIOPSY     Patient location during evaluation: PACU Anesthesia Type: MAC Level of consciousness: awake and alert Pain management: pain level controlled Vital Signs Assessment: post-procedure vital signs reviewed and stable Respiratory status: spontaneous breathing, nonlabored ventilation, respiratory function stable and patient connected to nasal cannula oxygen Cardiovascular status: stable and blood pressure returned to baseline Postop Assessment: no apparent nausea or vomiting Anesthetic complications: no  No notable events documented.  Last Vitals:  Vitals:   10/17/22 0930 10/17/22 0940  BP: (!) 160/70 (!) 161/71  Pulse: (!) 57 64  Resp: 16 15  Temp:    SpO2: 98% 98%    Last Pain:  Vitals:   10/18/22 1527  TempSrc:   PainSc: 0-No pain                 Kaylor Simenson      

## 2022-10-25 ENCOUNTER — Encounter: Payer: Self-pay | Admitting: Gastroenterology

## 2022-10-25 NOTE — Progress Notes (Signed)
Cardiology Office Note:    Date:  05/16/2022   ID:  Aaron [name], DOB 1945/08/18, MRN [id number]  PCP:  Aaron Grandchild, MD   [facility] Providers Cardiologist:  Aaron Kernen [place], MD Cardiology APP:  Aaron [name], [place]     Referring MD: Aaron Grandchild, MD   No chief complaint on file.   History of Present Illness:    Aaron [name] is a 77 y.o. male with a hx of hypertension, coronary artery disease, recurrent PE/DVTs on xarelto, GERD, hyperlipidemia, and obesity.  He has a history of stenting to his mid LAD in 2005 following a nuclear stress test in that showed evidence of anterior apical ischemia.  Follow-up Myoview in 2014 was normal.  He was anticoagulated on Xarelto for recurrent PE/DVT, but with a history of chronic IDA requiring transfusions.  Hypertension was managed by his PCP. He was recently hospitalized 03/2022 with an in-hospital Vfib arrest in the setting of GI bleed and Hb of 4.8. Initial EKG with ST depressions. LHC revealed nonobstructive CAD, no intervention. Telemetry with sinus rhythm. Suspected demand  ischemia in the setting of severe anemia. However, due to risk of re-bleed, he was discharged with a lifevest.  GI workup notable for EGD showing gastric antral vascular ectasia treated with APC, felt to be extremely high risk for re-bleed. GI recommended complete discontinuation of OAC. Therefore, Xarelto held and IVC filter was placed by IR. His last EGD on April 14 showed multiple gastric polyps and GAVE treated again with APC.      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"   Heart murmur    Hyperlipidemia    Hypertension     Insomnia    Osteoarthritis    PONV (postoperative nausea and vomiting)    2006 - knee surgery   Pre-diabetes    Psoriasis    PTSD (post-traumatic stress disorder)    Pulmonary embolism (HCC) ~ 03/2006   bilaterally   Seasonal allergies    Shortness of breath 06/08/11   "w/exertion; that's why I'm here"   Sleep apnea, obstructive    Umbilical hernia     Past Surgical History:  Procedure Laterality Date   BUBBLE STUDY  03/24/2022   Procedure: BUBBLE STUDY;  Surgeon: Aaron Danas, MD;  Location: [facility] ENDOSCOPY;  Service: Gastroenterology;;   CARDIAC CATHETERIZATION     carpel tunnel release Bilateral ~ 06/2005/~ 08/2005   right/left   COLONOSCOPY W/ BIOPSIES AND POLYPECTOMY     CORONARY ANGIOPLASTY     CORONARY STENT PLACEMENT  02/13/2004   1   ENUCLEATION Right    age 55   ESOPHAGOGASTRODUODENOSCOPY (EGD) WITH PROPOFOL N/A 03/24/2022   Procedure: ESOPHAGOGASTRODUODENOSCOPY (EGD) WITH PROPOFOL;  Surgeon: Aaron Danas, MD;  Location: [facility] ENDOSCOPY;  Service: Gastroenterology;  Laterality: N/A;   EYE SURGERY     age 47; "ruptured  right pupil"   EYE SURGERY     age 77   EYE SURGERY  1957   placed artificial right eye  HEMOSTASIS CONTROL  03/24/2022   Procedure: HEMOSTASIS CONTROL;  Surgeon: Aaron Danas, MD;  Location: [facility] ENDOSCOPY;  Service: Gastroenterology;;   HOT HEMOSTASIS N/A 03/24/2022   Procedure: HOT HEMOSTASIS (ARGON PLASMA COAGULATION/BICAP);  Surgeon: Aaron Danas, MD;  Location: [facility] ENDOSCOPY;  Service: Gastroenterology;  Laterality: N/A;   INGUINAL HERNIA REPAIR Right 1983   INGUINAL HERNIA REPAIR Left 1985   IR IVC FILTER PLMT / S&I /IMG GUID/MOD SED  03/29/2022   KNEE ARTHROSCOPY  ~ 06/2005   right   LEFT HEART CATH AND CORONARY ANGIOGRAPHY N/A 03/25/2022   Procedure: LEFT HEART CATH AND CORONARY ANGIOGRAPHY;  Surgeon: Aaron Pyo, MD;  Location: [facility] INVASIVE CV LAB;  Service: Cardiovascular;  Laterality: N/A;   MULTIPLE TOOTH EXTRACTIONS      spinal injections  2006-2007   "maybe 10 injections for herniated discs"   TEE WITHOUT CARDIOVERSION  03/24/2022   Procedure: TRANSESOPHAGEAL ECHOCARDIOGRAM (TEE);  Surgeon: Aaron Danas, MD;  Location: [facility] ENDOSCOPY;  Service: Gastroenterology;;   TONSILLECTOMY     "when I was real young"   TOTAL KNEE ARTHROPLASTY Left 10/09/2015   Procedure: TOTAL KNEE ARTHROPLASTY;  Surgeon: Aaron Geralds, MD;  Location: [facility] OR;  Service: Orthopedics;  Laterality: Left;   TOTAL KNEE ARTHROPLASTY Right 04/04/2016   Procedure: TOTAL KNEE ARTHROPLASTY;  Surgeon: Aaron Geralds, MD;  Location: [facility] OR;  Service: Orthopedics;  Laterality: Right;    Current Medications: Current Meds  Medication Sig   amLODipine (NORVASC) 5 MG tablet TAKE 1 TABLET BY MOUTH EVERYDAY AT BEDTIME   aspirin EC 81 MG tablet Take 1 tablet (81 mg total) by mouth daily. Swallow whole.   atorvastatin (LIPITOR) 80 MG tablet TAKE 1 TABLET BY MOUTH ONCE  DAILY   clonazePAM (KLONOPIN) 0.5 MG tablet TAKE 1 TABLET BY MOUTH AT BEDTIME.   ezetimibe (ZETIA) 10 MG tablet TAKE 1 TABLET BY MOUTH  DAILY   ferrous sulfate 325 (65 FE) MG EC tablet TAKE 1 TABLET BY MOUTH DAILY WITH BREAKFAST. SCHEDULE AN APPOINTMENT FOR FURTHER REFILLS (Patient taking differently: Take 325 mg by mouth daily with breakfast.)   furosemide (LASIX) 40 MG tablet Take 1 tablet (40 mg total) by mouth daily.   hydrALAZINE (APRESOLINE) 25 MG tablet Take 1 tablet (25 mg total) by mouth 3 (three) times daily.   pantoprazole (PROTONIX) 40 MG tablet Take 1 tablet (40 mg total) by mouth daily.   polyethylene glycol (MIRALAX / GLYCOLAX) 17 g packet Take 17 g by mouth daily. With coffee   spironolactone (ALDACTONE) 25 MG tablet Take 1 tablet (25 mg total) by mouth daily.   tiZANidine (ZANAFLEX) 4 MG tablet Take 4 mg by mouth every 8 (eight) hours as needed for muscle spasms.   traMADol (ULTRAM) 50 MG tablet Take 2 tablets (100 mg total) by mouth every 8 (eight) hours as needed for moderate  pain.   traZODone (DESYREL) 50 MG tablet TAKE 1 TABLET BY MOUTH EVERYDAY AT BEDTIME (Patient taking differently: Take 50 mg by mouth at bedtime.)   venlafaxine XR (EFFEXOR-XR) 150 MG 24 hr capsule TAKE 1 CAPSULE BY MOUTH DAILY  WITH BREAKFAST     Allergies:   Wellbutrin [bupropion] and Amoxicillin   Social History   Socioeconomic History   Marital status: Married    Spouse name: Not on file   Number of children: 3   Years of education: Not on file   Highest education level: Not on file  Occupational History   Occupation: building maintenance  Comment: disabled  Tobacco Use   Smoking status: Former    Packs/day: 1.50    Years: 25.00    Total pack years: 37.50    Types: Cigarettes    Quit date: 07/10/1995    Years since quitting: 26.8   Smokeless tobacco: Never  Vaping Use   Vaping Use: Never used  Substance and Sexual Activity   Alcohol use: No   Drug use: No   Sexual activity: Not on file  Other Topics Concern   Not on file  Social History Narrative   Former Education administrator, 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   Recent stress, currently undergoing financial difficulties.   Social Determinants of Health   Financial Resource Strain: Low Risk  (10/29/2021)   Overall Financial Resource Strain (CARDIA)    Difficulty of Paying Living Expenses: Not hard at all  Food Insecurity: No Food Insecurity (04/07/2022)   Hunger Vital Sign    Worried About Running Out of Food in the Last Year: Never true    Ran Out of Food in the Last Year: Never true  Transportation Needs: No Transportation Needs (04/07/2022)   PRAPARE - Administrator, Civil Service (Medical): No    Lack of Transportation (Non-Medical): No  Physical Activity: Insufficiently Active (10/29/2021)   Exercise Vital Sign    Days of Exercise per Week: 3 days    Minutes of Exercise per Session: 30 min  Stress: No Stress Concern Present (10/29/2021)   [employer] of Occupational Health - Occupational  Stress Questionnaire    Feeling of Stress : Not at all  Social Connections: Socially Isolated (10/29/2021)   Social Connection and Isolation Panel [NHANES]    Frequency of Communication with Friends and Family: Three times a week    Frequency of Social Gatherings with Friends and Family: Three times a week    Attends Religious Services: Never    Active Member of Clubs or Organizations: No    Attends Banker Meetings: Never    Marital Status: Widowed     Family History: The patient's family history includes Asthma in his brother, father, and sister; Breast cancer in his sister; Emphysema in his father; Liver cancer in his sister. There is no history of Colon cancer or Esophageal cancer.  ROS:   Please see the history of present illness.     All other systems reviewed and are negative.  EKGs/Labs/Other Studies Reviewed:    The following studies were reviewed today:  Left heart cath 03/25/22:   Dist LAD lesion is 10% stenosed.   1st Mrg lesion is 30% stenosed.   1.  Diffuse mild to moderate obstructive disease with patent mid LAD stent. 2.  Elevated LVEDP of 25 mmHg.   Commendation: Continued medical therapy.     Echo 03/26/22:  1. Study not well visualized to full assess endocardial borders. Left  ventricular ejection fraction, by estimation, is 55 to 60%. The left  ventricle has normal function. The left ventricle has no regional wall  motion abnormalities. There is mild left  ventricular hypertrophy. Left ventricular diastolic parameters were  normal.   2. Right ventricular systolic function is normal. The right ventricular  size is normal. There is normal pulmonary artery systolic pressure.   3. The mitral valve is normal in structure. No evidence of mitral valve  regurgitation.   4. The aortic valve was not well visualized. Aortic valve regurgitation  is not visualized.   5. The inferior vena cava  is normal in size with greater than 50%  respiratory  variability, suggesting right atrial pressure of 3 mmHg.      TEE 03/24/22: See external report  EKG:  EKG is not ordered today.    Recent Labs: 02/09/2022: TSH 2.44 03/24/2022: ALT 22 04/18/2022: BNP 42.0; BUN 24; Creatinine, Ser 1.07; Magnesium 2.3; Potassium 4.7; Sodium 139 04/25/2022: Hemoglobin 10.2; Platelet Count 208  Recent Lipid Panel    Component Value Date/Time   CHOL 126 08/10/2021 1520   CHOL 151 12/17/2020 1017   TRIG 823 (H) 03/25/2022 0517   HDL 37.70 (L) 08/10/2021 1520   HDL 40 12/17/2020 1017   CHOLHDL 3 08/10/2021 1520   VLDL 42.8 (H) 08/10/2021 1520   LDLCALC 89 12/17/2020 1017   LDLDIRECT 62.0 08/10/2021 1520     Risk Assessment/Calculations:               Physical Exam:    VS:  BP 130/65 (BP Location: Left Arm, Patient Position: Sitting, Cuff Size: Normal)   Pulse 73   Ht 5\' 6"  (1.676 m)   Wt 233 lb (105.7 kg)   SpO2 98%   BMI 37.61 kg/m     Wt Readings from Last 3 Encounters:  05/16/22 233 lb (105.7 kg)  05/10/22 233 lb 9.6 oz (106 kg)  05/03/22 231 lb 9.6 oz (105.1 kg)     GEN:  Well nourished, well developed in no acute distress HEENT: Normal NECK: No JVD; No carotid bruits LYMPHATICS: No lymphadenopathy CARDIAC: RRR, no murmurs, rubs, gallops - life vest in place RESPIRATORY:  Clear to auscultation without rales, wheezing or rhonchi  ABDOMEN: Soft, non-tender, non-distended MUSCULOSKELETAL:  No edema; No deformity  SKIN: Warm and dry NEUROLOGIC:  Alert and oriented x 3 PSYCHIATRIC:  Normal affect   ASSESSMENT:    1. Cardiac arrest with ventricular fibrillation (HCC)   2. Coronary artery disease involving native coronary artery of native heart without angina pectoris   3. Acute on chronic diastolic CHF (congestive heart failure) (HCC)   4. Deep vein thrombosis (DVT) of proximal lower extremity, unspecified chronicity, unspecified laterality (HCC)   5. GAVE (gastric antral vascular ectasia)     PLAN:    In order of  problems listed above:  Vfib arrest - in hospital in setting of profound anemia - no arrhythmia on Lifevest.  This was discontinued his last visit.   - felt secondary to significant anemia   NSTEMI - demand ischemia- also in setting of profound anemia CAD s/p LAD PCI. Cardiac cath showed patent stent with mild nonobstructive CAD Medical management.    Chronic diastolic heart failure- no evidence of volume overload.  diuresed in the hospital Echo with preserved EF and normal RV No indication for Entresto and Jardiance at this time.  On amlodipine and aldactone for HTN  Hx of DVT/PE Chronic IDA with frequent blood transfusions Previously on xarelto Now with IV filter in place  HTN  Follow up in 6 months.       Medication Adjustments/Labs and Tests Ordered: Current medicines are reviewed at length with the patient today.  Concerns regarding medicines are outlined above.  No orders of the defined types were placed in this encounter.  No orders of the defined types were placed in this encounter.   There are no Patient Instructions on file for this visit.   Signed, Lulla Linville [place], MD  05/16/2022 4:02 PM    [facility]

## 2022-10-31 ENCOUNTER — Ambulatory Visit: Payer: Medicare Other | Attending: Cardiology | Admitting: Cardiology

## 2022-10-31 ENCOUNTER — Encounter: Payer: Self-pay | Admitting: Cardiology

## 2022-10-31 VITALS — BP 146/80 | HR 80 | Ht 66.0 in | Wt 241.8 lb

## 2022-10-31 DIAGNOSIS — I469 Cardiac arrest, cause unspecified: Secondary | ICD-10-CM

## 2022-10-31 DIAGNOSIS — I2510 Atherosclerotic heart disease of native coronary artery without angina pectoris: Secondary | ICD-10-CM | POA: Diagnosis not present

## 2022-10-31 DIAGNOSIS — E7800 Pure hypercholesterolemia, unspecified: Secondary | ICD-10-CM

## 2022-10-31 DIAGNOSIS — I10 Essential (primary) hypertension: Secondary | ICD-10-CM | POA: Diagnosis not present

## 2022-10-31 DIAGNOSIS — I824Y9 Acute embolism and thrombosis of unspecified deep veins of unspecified proximal lower extremity: Secondary | ICD-10-CM

## 2022-10-31 DIAGNOSIS — I4901 Ventricular fibrillation: Secondary | ICD-10-CM

## 2022-10-31 NOTE — Patient Instructions (Signed)
Medication Instructions:  Continue same medications *If you need a refill on your cardiac medications before your next appointment, please call your pharmacy*   Lab Work: None ordered   Testing/Procedures: None ordered   Follow-Up: At [facility], you and your health needs are our priority.  As part of our continuing mission to provide you with exceptional heart care, we have created designated Provider Care Teams.  These Care Teams include your primary Cardiologist (physician) and Advanced Practice Providers (APPs -  Physician Assistants and Nurse Practitioners) who all work together to provide you with the care you need, when you need it.  We recommend signing up for the patient portal called "MyChart".  Sign up information is provided on this After Visit Summary.  MyChart is used to connect with patients for Virtual Visits (Telemedicine).  Patients are able to view lab/test results, encounter notes, upcoming appointments, etc.  Non-urgent messages can be sent to your provider as well.   To learn more about what you can do with MyChart, go to [email or web address].    Your next appointment:  6 months     Call in Mid May to schedule Oct appointment     Provider:  Dr.Jordan   

## 2022-11-03 ENCOUNTER — Other Ambulatory Visit: Payer: Self-pay | Admitting: Internal Medicine

## 2022-11-03 ENCOUNTER — Telehealth: Payer: Self-pay | Admitting: Internal Medicine

## 2022-11-03 DIAGNOSIS — F411 Generalized anxiety disorder: Secondary | ICD-10-CM

## 2022-11-03 DIAGNOSIS — F32A Depression, unspecified: Secondary | ICD-10-CM

## 2022-11-03 MED ORDER — VENLAFAXINE HCL ER 150 MG PO CP24: 150.0000 mg | ORAL_CAPSULE | Freq: Every day | ORAL | 0 refills | Status: DC

## 2022-11-03 NOTE — Telephone Encounter (Signed)
Prescription Request  11/03/2022  LOV: 04/19/2022  What is the name of the medication or equipment? venlafaxine XR (EFFEXOR-XR) 150 MG 24 hr capsule   Have you contacted your pharmacy to request a refill? No   Which pharmacy would you like this sent to?     [facility] - [place], [place] - [address] [place] [address] Phone: [phone] Fax: [phone]  Patient notified that their request is being sent to the clinical staff for review and that they should receive a response within 2 business days.   Please advise at Mobile [phone] (mobile)

## 2022-11-06 ENCOUNTER — Other Ambulatory Visit: Payer: Self-pay | Admitting: Internal Medicine

## 2022-11-06 DIAGNOSIS — M170 Bilateral primary osteoarthritis of knee: Secondary | ICD-10-CM

## 2022-11-07 ENCOUNTER — Encounter: Payer: Self-pay | Admitting: Internal Medicine

## 2022-11-07 ENCOUNTER — Ambulatory Visit (INDEPENDENT_AMBULATORY_CARE_PROVIDER_SITE_OTHER): Payer: Medicare Other | Admitting: Internal Medicine

## 2022-11-07 VITALS — BP 144/82 | HR 78 | Temp 98.1°F | Ht 66.0 in | Wt 242.0 lb

## 2022-11-07 DIAGNOSIS — G8929 Other chronic pain: Secondary | ICD-10-CM | POA: Diagnosis not present

## 2022-11-07 DIAGNOSIS — M170 Bilateral primary osteoarthritis of knee: Secondary | ICD-10-CM

## 2022-11-07 DIAGNOSIS — M545 Low back pain, unspecified: Secondary | ICD-10-CM

## 2022-11-07 DIAGNOSIS — Z23 Encounter for immunization: Secondary | ICD-10-CM

## 2022-11-07 DIAGNOSIS — D509 Iron deficiency anemia, unspecified: Secondary | ICD-10-CM | POA: Diagnosis not present

## 2022-11-07 DIAGNOSIS — E781 Pure hyperglyceridemia: Secondary | ICD-10-CM | POA: Diagnosis not present

## 2022-11-07 DIAGNOSIS — G4700 Insomnia, unspecified: Secondary | ICD-10-CM | POA: Diagnosis not present

## 2022-11-07 DIAGNOSIS — F32A Depression, unspecified: Secondary | ICD-10-CM

## 2022-11-07 DIAGNOSIS — F411 Generalized anxiety disorder: Secondary | ICD-10-CM

## 2022-11-07 DIAGNOSIS — I10 Essential (primary) hypertension: Secondary | ICD-10-CM | POA: Diagnosis not present

## 2022-11-07 MED ORDER — TRAMADOL HCL 50 MG PO TABS
ORAL_TABLET | ORAL | 0 refills | Status: DC
Start: 2022-11-07 — End: 2022-12-10

## 2022-11-07 MED ORDER — TRAZODONE HCL 150 MG PO TABS
150.0000 mg | ORAL_TABLET | Freq: Every day | ORAL | 1 refills | Status: DC
Start: 2022-11-07 — End: 2023-05-22

## 2022-11-07 NOTE — Progress Notes (Signed)
Subjective:  Patient ID: Aaron [name], male    DOB: 12/19/1945  Age: 77 y.o. MRN: [id number]  CC: Anemia, Back Pain, and Osteoarthritis   HPI [name] presents for f/up ----  He is active and denies DOE, CP, SOB, edema but he complains of chronic fatigue.  Outpatient Medications Prior to Visit  Medication Sig Dispense Refill   amLODipine (NORVASC) 5 MG tablet TAKE 1 TABLET BY MOUTH EVERYDAY AT BEDTIME 90 tablet 0   ascorbic acid (C 500/ROSE HIPS) 500 MG tablet Take 500 mg by mouth daily.     aspirin EC 81 MG tablet Take 1 tablet (81 mg total) by mouth daily. Swallow whole. 30 tablet 0   atorvastatin (LIPITOR) 80 MG tablet TAKE 1 TABLET BY MOUTH ONCE  DAILY 100 tablet 1   Calcium Carb-Cholecalciferol (CALCIUM 600 + D PO) Take 1 tablet by mouth daily.     cholecalciferol (VITAMIN D3) 25 MCG (1000 UNIT) tablet Take 1,000 Units by mouth daily.     clonazePAM (KLONOPIN) 0.5 MG tablet TAKE 1 TABLET BY MOUTH EVERYDAY AT BEDTIME 90 tablet 3   ezetimibe (ZETIA) 10 MG tablet TAKE 1 TABLET BY MOUTH DAILY 100 tablet 2   ferrous sulfate 325 (65 FE) MG EC tablet TAKE 1 TABLET BY MOUTH DAILY WITH BREAKFAST. SCHEDULE AN APPOINTMENT FOR FURTHER REFILLS 90 tablet 3   furosemide (LASIX) 40 MG tablet Take 1 tablet (40 mg total) by mouth daily. 90 tablet 3   hydrALAZINE (APRESOLINE) 25 MG tablet Take 1 tablet (25 mg total) by mouth 3 (three) times daily. 270 tablet 3   Multiple Vitamins-Minerals (PRESERVISION AREDS 2) CAPS Take 1 capsule by mouth 2 (two) times daily.     neomycin-bacitracin-polymyxin (NEOSPORIN) OINT Apply 1 Application topically as needed for wound care.     polyethylene glycol (MIRALAX / GLYCOLAX) 17 g packet Take 17 g by mouth daily. With coffee     spironolactone (ALDACTONE) 25 MG tablet Take 1 tablet (25 mg total) by mouth daily. 90 tablet 3   venlafaxine XR (EFFEXOR-XR) 150 MG 24 hr capsule Take 1 capsule (150 mg total) by mouth daily with breakfast. 100 capsule 0   White  Petrolatum-Mineral Oil (GENTEAL TEARS NIGHT-TIME) OINT Apply 1 Application to eye at bedtime as needed (dry eyes).     pantoprazole (PROTONIX) 40 MG tablet Take 1 tablet (40 mg total) by mouth daily. Follow-up appt due must see provider for future refills 30 tablet 0   traMADol (ULTRAM) 50 MG tablet TAKE 2 TABLETS (100 MG TOTAL) BY MOUTH EVERY 8 HOURS AS NEEDED FOR MODERATE PAIN 180 tablet 0   traZODone (DESYREL) 50 MG tablet TAKE 1 TABLET BY MOUTH EVERYDAY AT BEDTIME 90 tablet 1   No facility-administered medications prior to visit.    ROS Review of Systems  Constitutional:  Positive for fatigue. Negative for appetite change, diaphoresis and unexpected weight change.  HENT: Negative.    Eyes: Negative.   Respiratory:  Positive for apnea. Negative for cough, chest tightness, shortness of breath and wheezing.   Cardiovascular:  Negative for chest pain, palpitations and leg swelling.  Gastrointestinal:  Negative for abdominal pain, diarrhea, nausea and vomiting.  Endocrine: Negative.   Genitourinary: Negative.  Negative for difficulty urinating.  Musculoskeletal:  Positive for arthralgias and back pain. Negative for myalgias and neck pain.  Skin: Negative.  Negative for color change, pallor and rash.  Neurological:  Negative for dizziness, weakness and headaches.  Hematological:  Negative for adenopathy.  Does not bruise/bleed easily.  Psychiatric/Behavioral:  Positive for dysphoric mood and sleep disturbance. Negative for confusion, decreased concentration and suicidal ideas. The patient is nervous/anxious.     Objective:  BP (!) 144/82 (BP Location: Right Arm, Patient Position: Sitting, Cuff Size: Large)   Pulse 78   Temp 98.1 F (36.7 C) (Oral)   Ht 5\' 6"  (1.676 m)   Wt 242 lb (109.8 kg)   SpO2 94%   BMI 39.06 kg/m   BP Readings from Last 3 Encounters:  11/07/22 (!) 144/82  10/31/22 (!) 146/80  10/17/22 (!) 161/71    Wt Readings from Last 3 Encounters:  11/07/22 242 lb  (109.8 kg)  10/31/22 241 lb 12.8 oz (109.7 kg)  09/21/22 240 lb (108.9 kg)    Physical Exam Vitals reviewed.  Constitutional:      Appearance: He is obese. He is not ill-appearing.  HENT:     Nose: Nose normal.     Mouth/Throat:     Mouth: Mucous membranes are moist.  Eyes:     General: No scleral icterus.    Conjunctiva/sclera: Conjunctivae normal.  Cardiovascular:     Rate and Rhythm: Normal rate and regular rhythm.     Heart sounds: No murmur heard.    No gallop.  Pulmonary:     Effort: Pulmonary effort is normal.     Breath sounds: No stridor. No wheezing, rhonchi or rales.  Abdominal:     General: Abdomen is flat.     Palpations: There is no mass.     Tenderness: There is no abdominal tenderness. There is no guarding.     Hernia: No hernia is present.  Musculoskeletal:        General: Normal range of motion.     Cervical back: Neck supple.     Right lower leg: No edema.     Left lower leg: No edema.  Lymphadenopathy:     Cervical: No cervical adenopathy.  Skin:    General: Skin is warm and dry.  Neurological:     General: No focal deficit present.     Mental Status: He is alert. Mental status is at baseline.  Psychiatric:        Mood and Affect: Mood normal.        Behavior: Behavior normal.     Lab Results  Component Value Date   WBC 7.1 09/20/2022   HGB 12.4 (L) 09/20/2022   HCT 37.2 (L) 09/20/2022   PLT 226 09/20/2022   GLUCOSE 100 (H) 04/18/2022   CHOL 126 08/10/2021   TRIG 823 (H) 03/25/2022   HDL 37.70 (L) 08/10/2021   LDLDIRECT 62.0 08/10/2021   LDLCALC 89 12/17/2020   ALT 22 03/24/2022   AST 31 03/24/2022   NA 139 04/18/2022   K 4.7 04/18/2022   CL 102 04/18/2022   CREATININE 1.07 04/18/2022   BUN 24 04/18/2022   CO2 19 (L) 04/18/2022   TSH 2.44 02/09/2022   PSA 0.59 02/14/2018   INR 1.11 03/25/2016   HGBA1C 4.3 (L) 03/25/2022    No results found.  Assessment & Plan:   Primary hypertension- His BP is well controlled. -      Basic metabolic panel; Future  Generalized anxiety disorder -     traZODone HCl; Take 1 tablet (150 mg total) by mouth at bedtime.  Dispense: 90 tablet; Refill: 1  Insomnia, unspecified type -     traZODone HCl; Take 1 tablet (150 mg total) by mouth at bedtime.  Dispense: 90 tablet; Refill: 1  Depression, unspecified depression type -     traZODone HCl; Take 1 tablet (150 mg total) by mouth at bedtime.  Dispense: 90 tablet; Refill: 1  Iron deficiency anemia, unspecified iron deficiency anemia type- Will monitor his H/H. -     CBC with Differential/Platelet; Future  Primary hypertriglyceridemia -     Lipid panel; Future  Chronic low back pain, unspecified back pain laterality, unspecified whether sciatica present -     traMADol HCl; TAKE 2 TABLETS (100 MG TOTAL) BY MOUTH EVERY 8 HOURS AS NEEDED FOR MODERATE PAIN  Dispense: 180 tablet; Refill: 0  Primary osteoarthritis of both knees -     traMADol HCl; TAKE 2 TABLETS (100 MG TOTAL) BY MOUTH EVERY 8 HOURS AS NEEDED FOR MODERATE PAIN  Dispense: 180 tablet; Refill: 0  Need for prophylactic vaccination and inoculation against varicella -     Shingrix; Inject 0.5 mLs into the muscle once for 1 dose.  Dispense: 0.5 mL; Refill: 1     Follow-up: Return in about 6 months (around 05/10/2023).  Sanda Linger, MD

## 2022-11-07 NOTE — Patient Instructions (Signed)
Insomnia Insomnia is a sleep disorder that makes it difficult to fall asleep or stay asleep. Insomnia can cause fatigue, low energy, difficulty concentrating, mood swings, and poor performance at work or school. There are three different ways to classify insomnia: Difficulty falling asleep. Difficulty staying asleep. Waking up too early in the morning. Any type of insomnia can be long-term (chronic) or short-term (acute). Both are common. Short-term insomnia usually lasts for 3 months or less. Chronic insomnia occurs at least three times a week for longer than 3 months. What are the causes? Insomnia may be caused by another condition, situation, or substance, such as: Having certain mental health conditions, such as anxiety and depression. Using caffeine, alcohol, tobacco, or drugs. Having gastrointestinal conditions, such as gastroesophageal reflux disease (GERD). Having certain medical conditions. These include: Asthma. Alzheimer's disease. Stroke. Chronic pain. An overactive thyroid gland (hyperthyroidism). Other sleep disorders, such as restless legs syndrome and sleep apnea. Menopause. Sometimes, the cause of insomnia may not be known. What increases the risk? Risk factors for insomnia include: Gender. Females are affected more often than males. Age. Insomnia is more common as people get older. Stress and certain medical and mental health conditions. Lack of exercise. Having an irregular work schedule. This may include working night shifts and traveling between different time zones. What are the signs or symptoms? If you have insomnia, the main symptom is having trouble falling asleep or having trouble staying asleep. This may lead to other symptoms, such as: Feeling tired or having low energy. Feeling nervous about going to sleep. Not feeling rested in the morning. Having trouble concentrating. Feeling irritable, anxious, or depressed. How is this diagnosed? This condition  may be diagnosed based on: Your symptoms and medical history. Your health care provider may ask about: Your sleep habits. Any medical conditions you have. Your mental health. A physical exam. How is this treated? Treatment for insomnia depends on the cause. Treatment may focus on treating an underlying condition that is causing the insomnia. Treatment may also include: Medicines to help you sleep. Counseling or therapy. Lifestyle adjustments to help you sleep better. Follow these instructions at home: Eating and drinking  Limit or avoid alcohol, caffeinated beverages, and products that contain nicotine and tobacco, especially close to bedtime. These can disrupt your sleep. Do not eat a large meal or eat spicy foods right before bedtime. This can lead to digestive discomfort that can make it hard for you to sleep. Sleep habits  Keep a sleep diary to help you and your health care provider figure out what could be causing your insomnia. Write down: When you sleep. When you wake up during the night. How well you sleep and how rested you feel the next day. Any side effects of medicines you are taking. What you eat and drink. Make your bedroom a dark, comfortable place where it is easy to fall asleep. Put up shades or blackout curtains to block light from outside. Use a white noise machine to block noise. Keep the temperature cool. Limit screen use before bedtime. This includes: Not watching TV. Not using your smartphone, tablet, or computer. Stick to a routine that includes going to bed and waking up at the same times every day and night. This can help you fall asleep faster. Consider making a quiet activity, such as reading, part of your nighttime routine. Try to avoid taking naps during the day so that you sleep better at night. Get out of bed if you are still awake after   15 minutes of trying to sleep. Keep the lights down, but try reading or doing a quiet activity. When you feel  sleepy, go back to bed. General instructions Take over-the-counter and prescription medicines only as told by your health care provider. Exercise regularly as told by your health care provider. However, avoid exercising in the hours right before bedtime. Use relaxation techniques to manage stress. Ask your health care provider to suggest some techniques that may work well for you. These may include: Breathing exercises. Routines to release muscle tension. Visualizing peaceful scenes. Make sure that you drive carefully. Do not drive if you feel very sleepy. Keep all follow-up visits. This is important. Contact a health care provider if: You are tired throughout the day. You have trouble in your daily routine due to sleepiness. You continue to have sleep problems, or your sleep problems get worse. Get help right away if: You have thoughts about hurting yourself or someone else. Get help right away if you feel like you may hurt yourself or others, or have thoughts about taking your own life. Go to your nearest emergency room or: Call 911. Call the National Suicide Prevention Lifeline at 1-800-273-8255 or 988. This is open 24 hours a day. Text the Crisis Text Line at 741741. Summary Insomnia is a sleep disorder that makes it difficult to fall asleep or stay asleep. Insomnia can be long-term (chronic) or short-term (acute). Treatment for insomnia depends on the cause. Treatment may focus on treating an underlying condition that is causing the insomnia. Keep a sleep diary to help you and your health care provider figure out what could be causing your insomnia. This information is not intended to replace advice given to you by your health care provider. Make sure you discuss any questions you have with your health care provider. Document Revised: 05/31/2021 Document Reviewed: 05/31/2021 [employer] Patient Education  2023 [employer]  

## 2022-11-10 ENCOUNTER — Other Ambulatory Visit: Payer: Self-pay | Admitting: Internal Medicine

## 2022-11-10 DIAGNOSIS — K219 Gastro-esophageal reflux disease without esophagitis: Secondary | ICD-10-CM

## 2022-11-14 ENCOUNTER — Other Ambulatory Visit: Payer: Self-pay | Admitting: Internal Medicine

## 2022-11-14 MED ORDER — SHINGRIX 50 MCG/0.5ML IM SUSR: 0.5000 mL | Freq: Once | INTRAMUSCULAR | 1 refills | Status: AC

## 2022-12-05 ENCOUNTER — Ambulatory Visit (INDEPENDENT_AMBULATORY_CARE_PROVIDER_SITE_OTHER): Payer: Medicare Other

## 2022-12-05 VITALS — Ht 66.0 in | Wt 237.0 lb

## 2022-12-05 DIAGNOSIS — Z0000 Encounter for general adult medical examination without abnormal findings: Secondary | ICD-10-CM | POA: Diagnosis not present

## 2022-12-05 NOTE — Progress Notes (Addendum)
I connected with  Denton Ar on 12/05/22 by a audio enabled telemedicine application and verified that I am speaking with the correct person using two identifiers.  Patient Location: Home  Provider Location: Office/Clinic  I discussed the limitations of evaluation and management by telemedicine. The patient expressed understanding and agreed to proceed.  Subjective:   Aaron [name] is a 77 y.o. male who presents for Medicare Annual/Subsequent preventive examination.  Review of Systems     Cardiac Risk Factors include: advanced age (>38men, >73 women);dyslipidemia;hypertension;male gender;obesity (BMI >30kg/m2);sedentary lifestyle     Objective:    Today's Vitals   12/05/22 1435 12/05/22 1437  Weight: 237 lb (107.5 kg)   Height: 5\' 6"  (1.676 m)   PainSc: 5  5   PainLoc: Knee    Body mass index is 38.25 kg/m.     12/05/2022    2:43 PM 10/17/2022    7:32 AM 08/11/2022    9:41 AM 03/24/2022    3:03 PM 03/24/2022    4:19 AM 02/22/2022    2:50 PM 02/14/2022    9:03 AM  Advanced Directives  Does Patient Have a Medical Advance Directive? Yes No No No No No No  Type of Estate agent of [place];Living will        Copy of Healthcare Power of Attorney in Chart? No - copy requested        Would patient like information on creating a medical advance directive?  No - Patient declined No - Patient declined No - Patient declined No - Patient declined No - Patient declined     Current Medications (verified) Outpatient Encounter Medications as of 12/05/2022  Medication Sig   amLODipine (NORVASC) 5 MG tablet TAKE 1 TABLET BY MOUTH EVERYDAY AT BEDTIME   ascorbic acid (C 500/ROSE HIPS) 500 MG tablet Take 500 mg by mouth daily.   aspirin EC 81 MG tablet Take 1 tablet (81 mg total) by mouth daily. Swallow whole.   atorvastatin (LIPITOR) 80 MG tablet TAKE 1 TABLET BY MOUTH ONCE  DAILY   Calcium Carb-Cholecalciferol (CALCIUM 600 + D PO) Take 1 tablet by mouth daily.    cholecalciferol (VITAMIN D3) 25 MCG (1000 UNIT) tablet Take 1,000 Units by mouth daily.   clonazePAM (KLONOPIN) 0.5 MG tablet TAKE 1 TABLET BY MOUTH EVERYDAY AT BEDTIME   ezetimibe (ZETIA) 10 MG tablet TAKE 1 TABLET BY MOUTH DAILY   ferrous sulfate 325 (65 FE) MG EC tablet TAKE 1 TABLET BY MOUTH DAILY WITH BREAKFAST. SCHEDULE AN APPOINTMENT FOR FURTHER REFILLS   furosemide (LASIX) 40 MG tablet Take 1 tablet (40 mg total) by mouth daily.   hydrALAZINE (APRESOLINE) 25 MG tablet Take 1 tablet (25 mg total) by mouth 3 (three) times daily.   Multiple Vitamins-Minerals (PRESERVISION AREDS 2) CAPS Take 1 capsule by mouth 2 (two) times daily.   neomycin-bacitracin-polymyxin (NEOSPORIN) OINT Apply 1 Application topically as needed for wound care.   pantoprazole (PROTONIX) 40 MG tablet TAKE 1 TABLET (40 MG TOTAL) BY MOUTH DAILY. FOLLOW-UP APPT DUE MUST SEE PROVIDER FOR FUTURE REFILLS   polyethylene glycol (MIRALAX / GLYCOLAX) 17 g packet Take 17 g by mouth daily. With coffee   spironolactone (ALDACTONE) 25 MG tablet Take 1 tablet (25 mg total) by mouth daily.   traMADol (ULTRAM) 50 MG tablet TAKE 2 TABLETS (100 MG TOTAL) BY MOUTH EVERY 8 HOURS AS NEEDED FOR MODERATE PAIN   traZODone (DESYREL) 150 MG tablet Take 1 tablet (150 mg total) by  mouth at bedtime.   venlafaxine XR (EFFEXOR-XR) 150 MG 24 hr capsule Take 1 capsule (150 mg total) by mouth daily with breakfast.   White Petrolatum-Mineral Oil (GENTEAL TEARS NIGHT-TIME) OINT Apply 1 Application to eye at bedtime as needed (dry eyes).   No facility-administered encounter medications on file as of 12/05/2022.    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"   Heart murmur    Hyperlipidemia    Hypertension    Insomnia    Osteoarthritis    PONV (postoperative nausea and vomiting)    2006 - knee surgery   Pre-diabetes    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89   Psoriasis    PTSD (post-traumatic stress disorder)    Pulmonary embolism (HCC) ~ 03/2006   bilaterally   Seasonal allergies    Shortness of breath 06/08/2011   "w/exertion; that's why I'm here"   Sleep apnea    Sleep apnea, obstructive    Umbilical hernia    Past Surgical History:  Procedure Laterality Date   BIOPSY  10/17/2022   Procedure: BIOPSY;  Surgeon: Tressia Danas, MD;  Location: [facility] ENDOSCOPY;  Service: Gastroenterology;;   Thressa Sheller STUDY  03/24/2022   Procedure: BUBBLE STUDY;  Surgeon: Tressia Danas, MD;  Location: [facility] END[facility]COPY;  Service: Gastroenterology;;   CARDIAC CATHETERIZATION     carpel tunnel release Bilateral ~ 06/2005/~ 08/2005   right/left   COLONOSCOPY W/ BIOPSIES AND POLYPECTOMY     COLONOSCOPY WITH PROPOFOL N/A 08/11/2022   Procedure: COLONOSCOPY WITH PROPOFOL;  Surgeon: Tressia Danas, MD;  Location: [facility] ENDOSCOPY;  Service: Gastroenterology;  Laterality: N/A;   CORONARY ANGIOPLASTY     CORONARY STENT PLACEMENT  02/13/2004   1   ENUCLEATION Right    age 42   ESOPHAGOGASTRODUODENOSCOPY (EGD) WITH PROPOFOL N/A 03/24/2022   Procedure: ESOPHAGOGASTRODUODENOSCOPY (EGD) WITH PROPOFOL;  Surgeon: Tressia Danas, MD;  Location: [facility] ENDOSCOPY;  Service: Gastroenterology;  Laterality: N/A;   ESOPHAGOGASTRODUODENOSCOPY (EGD) WITH PROPOFOL N/A 08/11/2022   Procedure: ESOPHAGOGASTRODUODENOSCOPY (EGD) WITH PROPOFOL;  Surgeon: Tressia Danas, MD;  Location: [facility] ENDOSCOPY;  Service: Gastroenterology;  Laterality: N/A;   ESOPHAGOGASTRODUODENOSCOPY (EGD) WITH PROPOFOL N/A 10/17/2022   Procedure: ESOPHAGOGASTRODUODENOSCOPY  (EGD) WITH PROPOFOL;  Surgeon: Tressia Danas, MD;  Location: [facility] ENDOSCOPY;  Service: Gastroenterology;  Laterality: N/A;   EYE SURGERY     age 67; "ruptured  right pupil"   EYE SURGERY     age 69   EYE SURGERY  1957   placed artificial right eye   HEMOSTASIS CONTROL  03/24/2022   Procedure: HEMOSTASIS CONTROL;  Surgeon: Tressia Danas, MD;  Location: [facility] ENDOSCOPY;  Service: Gastroenterology;;   HOT HEMOSTASIS N/A 03/24/2022   Procedure: HOT HEMOSTASIS (ARGON PLASMA COAGULATION/BICAP);  Surgeon: Tressia Danas, MD;  Location: [facility] ENDOSCOPY;  Service: Gastroenterology;  Laterality: N/A;   HOT HEMOSTASIS N/A 08/11/2022   Procedure: HOT HEMOSTASIS (ARGON PLASMA  COAGULATION/BICAP);  Surgeon: Tressia Danas, MD;  Location: [facility] ENDOSCOPY;  Service: Gastroenterology;  Laterality: N/A;  stomach   HOT HEMOSTASIS N/A 10/17/2022   Procedure: HOT HEMOSTASIS (ARGON PLASMA COAGULATION/BICAP);  Surgeon: Tressia Danas, MD;  Location: [facility] ENDOSCOPY;  Service: Gastroenterology;  Laterality: N/A;   INGUINAL HERNIA REPAIR Right 1983   INGUINAL HERNIA REPAIR Left 1985   IR IVC FILTER PLMT / S&I /IMG GUID/MOD SED  03/29/2022   KNEE ARTHROSCOPY  ~ 06/2005   right   LEFT HEART CATH AND CORONARY ANGIOGRAPHY N/A 03/25/2022   Procedure: LEFT HEART CATH AND CORONARY ANGIOGRAPHY;  Surgeon: Orbie Pyo, MD;  Location: [facility] INVASIVE CV LAB;  Service: Cardiovascular;  Laterality: N/A;   MULTIPLE TOOTH EXTRACTIONS     POLYPECTOMY  08/11/2022   Procedure: POLYPECTOMY;  Surgeon: Tressia Danas, MD;  Location: [facility] ENDOSCOPY;  Service: Gastroenterology;;  colon   spinal injections  2006-2007   "maybe 10 injections for herniated discs"   TEE WITHOUT CARDIOVERSION  03/24/2022   Procedure: TRANSESOPHAGEAL ECHOCARDIOGRAM (TEE);  Surgeon: Tressia Danas, MD;  Location: [facility] ENDOSCOPY;  Service: Gastroenterology;;   TONSILLECTOMY     "when I was real young"   TOTAL KNEE ARTHROPLASTY Left 10/09/2015   Procedure: TOTAL KNEE  ARTHROPLASTY;  Surgeon: Jodi Geralds, MD;  Location: [facility] OR;  Service: Orthopedics;  Laterality: Left;   TOTAL KNEE ARTHROPLASTY Right 04/04/2016   Procedure: TOTAL KNEE ARTHROPLASTY;  Surgeon: Jodi Geralds, MD;  Location: [facility] OR;  Service: Orthopedics;  Laterality: Right;   Family History  Problem Relation Age of Onset   Asthma Father    Emphysema Father    Breast cancer Sister    Liver cancer Sister    Asthma Sister    Asthma Brother    Colon cancer Neg Hx    Esophageal cancer Neg Hx    Colon polyps Neg Hx    Rectal cancer Neg Hx    Stomach cancer Neg Hx    Social History   Socioeconomic History   Marital status: Married    Spouse name: Not on file   Number of children: 3   Years of education: Not on file   Highest education level: GED or equivalent  Occupational History   Occupation: building maintenance    Comment: disabled  Tobacco Use   Smoking status: Former    Packs/day: 1.50    Years: 25.00    Additional pack years: 0.00    Total pack years: 37.50    Types: Cigarettes    Quit date: 07/10/1995    Years since quitting: 27.4   Smokeless tobacco: Never  Vaping Use   Vaping Use: Never used  Substance and Sexual Activity   Alcohol use: No   Drug use: No   Sexual activity: Not on file  Other Topics Concern   Not on file  Social History Narrative   Former Education administrator, 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   Recent stress, currently undergoing financial difficulties.   Social Determinants of Health   Financial Resource Strain: Low Risk  (12/05/2022)   Overall Financial Resource Strain (CARDIA)    Difficulty of Paying Living Expenses: Not very hard  Recent Concern: Financial Resource Strain - Medium Risk (11/03/2022)   Overall Financial Resource Strain (CARDIA)    Difficulty of Paying Living Expenses: Somewhat hard  Food Insecurity: No Food Insecurity (12/05/2022)   Hunger Vital Sign    Worried About Running Out of Food in the Last Year: Never true  Ran Out of  Food in the Last Year: Never true  Transportation Needs: No Transportation Needs (12/05/2022)   PRAPARE - Administrator, Civil Service (Medical): No    Lack of Transportation (Non-Medical): No  Physical Activity: Inactive (12/05/2022)   Exercise Vital Sign    Days of Exercise per Week: 0 days    Minutes of Exercise per Session: 0 min  Stress: No Stress Concern Present (12/05/2022)   [employer] of Occupational Health - Occupational Stress Questionnaire    Feeling of Stress : Only a little  Recent Concern: Stress - Stress Concern Present (11/03/2022)   [employer] of Occupational Health - Occupational Stress Questionnaire    Feeling of Stress : To some extent  Social Connections: Unknown (12/05/2022)   Social Connection and Isolation Panel [NHANES]    Frequency of Communication with Friends and Family: Patient declined    Frequency of Social Gatherings with Friends and Family: Patient declined    Attends Religious Services: Patient declined    Database administrator or Organizations: No    Attends Engineer, structural: Never    Marital Status: Married  Recent Concern: Social Connections - Socially Isolated (11/03/2022)   Social Connection and Isolation Panel [NHANES]    Frequency of Communication with Friends and Family: Once a week    Frequency of Social Gatherings with Friends and Family: Never    Attends Religious Services: Never    Database administrator or Organizations: No    Attends Engineer, structural: Not on file    Marital Status: Married    Tobacco Counseling Counseling given: Not Answered   Clinical Intake:  Pre-visit preparation completed: Yes  Pain : 0-10 Pain Score: 5  Pain Type: Chronic pain Pain Location: Knee Pain Orientation: Right, Left     BMI - recorded: 38.25 Nutritional Status: BMI > 30  Obese Nutritional Risks: None Diabetes: No  How often do you need to have someone help you when you read instructions,  pamphlets, or other written materials from your doctor or pharmacy?: 2 - Rarely What is the last grade level you completed in school?: HSG  Diabetic? No  Interpreter Needed?: No  Information entered by :: Johnavon Mcclafferty N. Revella Shelton, LPN.   Activities of Daily Living    12/05/2022    2:44 PM 12/02/2022    1:49 PM  In your present state of health, do you have any difficulty performing the following activities:  Hearing? 0 0  Vision? 0 0  Difficulty concentrating or making decisions? 1 1  Walking or climbing stairs? 1 1  Dressing or bathing? 0 0  Doing errands, shopping? 0 0  Preparing Food and eating ? N N  Using the Toilet? N N  In the past six months, have you accidently leaked urine? N N  Do you have problems with loss of bowel control? N N  Managing your Medications? N N  Managing your Finances? N N  Housekeeping or managing your Housekeeping? N N    Patient Care Team: [name], MD as PCP - General (Internal Medicine) [place], Peter M, MD as PCP - Cardiology (Cardiology) Charna Elizabeth, MD as Consulting Physician (Gastroenterology) Colletta Maryland, RN as [facility] Care Management Duke, Roe Rutherford, PA as Physician Assistant (Cardiology)  Indicate any recent Medical Services you may have received from other than [facility] providers in the past year (date may be approximate).     Assessment:   This is  a routine wellness examination for [facility].  Hearing/Vision screen Hearing Screening - Comments:: Denies hearing difficulties.  Vision Screening - Comments:: Wears rx glasses - up to date with routine eye exams with [facility]   Dietary issues and exercise activities discussed: Current Exercise Habits: The patient does not participate in regular exercise at present, Exercise limited by: cardiac condition(s);orthopedic condition(s)   Goals Addressed             This Visit's Progress    Client understands the importance of follow-up with providers by  attending scheduled visits.        Depression Screen    12/05/2022    2:39 PM 02/09/2022    1:56 PM 10/29/2021   11:42 AM 08/10/2021    2:40 PM 10/12/2020    4:30 PM 05/18/2018    2:53 PM 04/20/2017    3:22 PM  PHQ 2/9 Scores  PHQ - 2 Score 0 0 0 2 0 0 2  PHQ- 9 Score 0 2    5 7     Fall Risk    12/05/2022    2:43 PM 12/02/2022    1:49 PM 02/09/2022    1:56 PM 10/29/2021   11:43 AM 10/29/2021   11:42 AM  Fall Risk   Falls in the past year? 1 1 0 0 0  Number falls in past yr: 0 0 0 0 0  Injury with Fall? 1 1 0 0 0  Risk for fall due to : History of fall(s);Impaired balance/gait;Orthopedic patient  No Fall Risks    Follow up Education provided;Falls prevention discussed  Falls evaluation completed Falls evaluation completed Falls evaluation completed  Comment    walker /cane     FALL RISK PREVENTION PERTAINING TO THE HOME:  Any stairs in or around the home? No  If so, are there any without handrails? No  Home free of loose throw rugs in walkways, pet beds, electrical cords, etc? Yes  Adequate lighting in your home to reduce risk of falls? Yes   ASSISTIVE DEVICES UTILIZED TO PREVENT FALLS:  Life alert? No  Use of a cane, walker or w/c? Yes  Grab bars in the bathroom? Yes  Shower chair or bench in shower? Yes  Elevated toilet seat or a handicapped toilet? Yes   TIMED UP AND GO:  Was the test performed? No . Telephonic Visit  Cognitive Function:        12/05/2022    2:45 PM  6CIT Screen  What Year? 0 points  What month? 0 points  What time? 0 points  Count back from 20 0 points  Months in reverse 0 points  Repeat phrase 0 points  Total Score 0 points    Immunizations Immunization History  Administered Date(s) Administered   Fluad Quad(high Dose 65+) 03/29/2020   Influenza Split 05/05/2011, 04/03/2013   Influenza Whole 04/30/2012   Influenza, High Dose Seasonal PF 08/10/2015, 03/08/2016, 04/20/2017, 04/24/2018, 03/26/2019, 06/03/2021   Influenza-Unspecified  03/26/2019, 06/03/2021   PFIZER Comirnaty(Gray Top)Covid-19 Tri-Sucrose Vaccine 10/19/2020   PFIZER(Purple Top)SARS-COV-2 Vaccination 08/24/2019, 09/17/2019, 03/29/2020   Pfizer Covid-19 Vaccine Bivalent Booster 70yrs & up 06/03/2021   Pneumococcal Conjugate-13 02/11/2015   Pneumococcal Polysaccharide-23 06/09/2011, 12/17/2020   Td 02/11/2015    TDAP status: Up to date  Flu Vaccine status: Due, Education has been provided regarding the importance of this vaccine. Advised may receive this vaccine at local pharmacy or Health Dept. Aware to provide a copy of the vaccination record if obtained from local  pharmacy or Health Dept. Verbalized acceptance and understanding.  Pneumococcal vaccine status: Up to date  Covid-19 vaccine status: Completed vaccines  Qualifies for Shingles Vaccine? Yes   Zostavax completed Yes   Shingrix Completed?: No.    Education has been provided regarding the importance of this vaccine. Patient has been advised to call insurance company to determine out of pocket expense if they have not yet received this vaccine. Advised may also receive vaccine at local pharmacy or Health Dept. Verbalized acceptance and understanding.  Screening Tests Health Maintenance  Topic Date Due   Zoster Vaccines- Shingrix (1 of 2) Never done   COVID-19 Vaccine (6 - 2023-24 season) 03/04/2022   INFLUENZA VACCINE  02/02/2023   Medicare Annual Wellness (AWV)  12/05/2023   DTaP/Tdap/Td (2 - Tdap) 02/10/2025   Pneumonia Vaccine 7+ Years old  Completed   Hepatitis C Screening  Completed   HPV VACCINES  Aged Out   Colonoscopy  Discontinued    Health Maintenance  Health Maintenance Due  Topic Date Due   Zoster Vaccines- Shingrix (1 of 2) Never done   COVID-19 Vaccine (6 - 2023-24 season) 03/04/2022    Colorectal cancer screening: Type of screening: Colonoscopy. Completed 08/11/2022. Repeat every 0 years  Lung Cancer Screening: (Low Dose CT Chest recommended if Age 62-80 years, 30  pack-year currently smoking OR have quit w/in 15years.) does not qualify.   Lung Cancer Screening Referral: no  Additional Screening:  Hepatitis C Screening: does qualify; Completed 03/08/2016  Vision Screening: Recommended annual ophthalmology exams for early detection of glaucoma and other disorders of the eye. Is the patient up to date with their annual eye exam?  Yes  Who is the provider or what is the name of the office in which the patient attends annual eye exams? [facility] If pt is not established with a provider, would they like to be referred to a provider to establish care? No .   Dental Screening: Recommended annual dental exams for proper oral hygiene  Community Resource Referral / Chronic Care Management: CRR required this visit?  No   CCM required this visit?  No      Plan:     I have personally reviewed and noted the following in the patient's chart:   Medical and social history Use of alcohol, tobacco or illicit drugs  Current medications and supplements including opioid prescriptions. Patient is not currently taking opioid prescriptions. Functional ability and status Nutritional status Physical activity Advanced directives List of other physicians Hospitalizations, surgeries, and ER visits in previous 12 months Vitals Screenings to include cognitive, depression, and falls Referrals and appointments  In addition, I have reviewed and discussed with patient certain preventive protocols, quality metrics, and best practice recommendations. A written personalized care plan for preventive services as well as general preventive health recommendations were provided to patient.     Mickeal Needy, LPN   0/03/8118   Nurse Notes: Normal cognitive status assessed by direct observation via telephone conversation by this Nurse Health Advisor. No abnormalities found.

## 2022-12-05 NOTE — Patient Instructions (Addendum)
Aaron [name] , Thank you for taking time to come for your Medicare Wellness Visit. I appreciate your ongoing commitment to your health goals. Please review the following plan we discussed and let me know if I can assist you in the future.   These are the goals we discussed:  Goals   None     This is a list of the screening recommended for you and due dates:  Health Maintenance  Topic Date Due   Zoster (Shingles) Vaccine (1 of 2) Never done   COVID-19 Vaccine (6 - 2023-24 season) 03/04/2022   Flu Shot  02/02/2023   Medicare Annual Wellness Visit  12/05/2023   DTaP/Tdap/Td vaccine (2 - Tdap) 02/10/2025   Pneumonia Vaccine  Completed   Hepatitis C Screening  Completed   HPV Vaccine  Aged Out   Colon Cancer Screening  Discontinued    Advanced directives: Yes; Please bring a copy of your health care power of attorney and living will to the office at your convenience.  Conditions/risks identified: Yes  Next appointment: Follow up in one year for your annual wellness visit.   Preventive Care 67 Years and Older, Male  Preventive care refers to lifestyle choices and visits with your health care provider that can promote health and wellness. What does preventive care include? A yearly physical exam. This is also called an annual well check. Dental exams once or twice a year. Routine eye exams. Ask your health care provider how often you should have your eyes checked. Personal lifestyle choices, including: Daily care of your teeth and gums. Regular physical activity. Eating a healthy diet. Avoiding tobacco and drug use. Limiting alcohol use. Practicing safe sex. Taking low doses of aspirin every day. Taking vitamin and mineral supplements as recommended by your health care provider. What happens during an annual well check? The services and screenings done by your health care provider during your annual well check will depend on your age, overall health, lifestyle risk factors, and  family history of disease. Counseling  Your health care provider may ask you questions about your: Alcohol use. Tobacco use. Drug use. Emotional well-being. Home and relationship well-being. Sexual activity. Eating habits. History of falls. Memory and ability to understand (cognition). Work and work Astronomer. Screening  You may have the following tests or measurements: Height, weight, and BMI. Blood pressure. Lipid and cholesterol levels. These may be checked every 5 years, or more frequently if you are over 30 years old. Skin check. Lung cancer screening. You may have this screening every year starting at age 40 if you have a 30-pack-year history of smoking and currently smoke or have quit within the past 15 years. Fecal occult blood test (FOBT) of the stool. You may have this test every year starting at age 38. Flexible sigmoidoscopy or colonoscopy. You may have a sigmoidoscopy every 5 years or a colonoscopy every 10 years starting at age 68. Prostate cancer screening. Recommendations will vary depending on your family history and other risks. Hepatitis C blood test. Hepatitis B blood test. Sexually transmitted disease (STD) testing. Diabetes screening. This is done by checking your blood sugar (glucose) after you have not eaten for a while (fasting). You may have this done every 1-3 years. Abdominal aortic aneurysm (AAA) screening. You may need this if you are a current or former smoker. Osteoporosis. You may be screened starting at age 34 if you are at high risk. Talk with your health care provider about your test results, treatment options, and  if necessary, the need for more tests. Vaccines  Your health care provider may recommend certain vaccines, such as: Influenza vaccine. This is recommended every year. Tetanus, diphtheria, and acellular pertussis (Tdap, Td) vaccine. You may need a Td booster every 10 years. Zoster vaccine. You may need this after age 68. Pneumococcal  13-valent conjugate (PCV13) vaccine. One dose is recommended after age 80. Pneumococcal polysaccharide (PPSV23) vaccine. One dose is recommended after age 50. Talk to your health care provider about which screenings and vaccines you need and how often you need them. This information is not intended to replace advice given to you by your health care provider. Make sure you discuss any questions you have with your health care provider. Document Released: 07/17/2015 Document Revised: 03/09/2016 Document Reviewed: 04/21/2015 Elsevier Interactive Patient Education  2017 [employer].  Fall Prevention in the Home Falls can cause injuries. They can happen to people of all ages. There are many things you can do to make your home safe and to help prevent falls. What can I do on the outside of my home? Regularly fix the edges of walkways and driveways and fix any cracks. Remove anything that might make you trip as you walk through a door, such as a raised step or threshold. Trim any bushes or trees on the path to your home. Use bright outdoor lighting. Clear any walking paths of anything that might make someone trip, such as rocks or tools. Regularly check to see if handrails are loose or broken. Make sure that both sides of any steps have handrails. Any raised decks and porches should have guardrails on the edges. Have any leaves, snow, or ice cleared regularly. Use sand or salt on walking paths during winter. Clean up any spills in your garage right away. This includes oil or grease spills. What can I do in the bathroom? Use night lights. Install grab bars by the toilet and in the tub and shower. Do not use towel bars as grab bars. Use non-skid mats or decals in the tub or shower. If you need to sit down in the shower, use a plastic, non-slip stool. Keep the floor dry. Clean up any water that spills on the floor as soon as it happens. Remove soap buildup in the tub or shower regularly. Attach  bath mats securely with double-sided non-slip rug tape. Do not have throw rugs and other things on the floor that can make you trip. What can I do in the bedroom? Use night lights. Make sure that you have a light by your bed that is easy to reach. Do not use any sheets or blankets that are too big for your bed. They should not hang down onto the floor. Have a firm chair that has side arms. You can use this for support while you get dressed. Do not have throw rugs and other things on the floor that can make you trip. What can I do in the kitchen? Clean up any spills right away. Avoid walking on wet floors. Keep items that you use a lot in easy-to-reach places. If you need to reach something above you, use a strong step stool that has a grab bar. Keep electrical cords out of the way. Do not use floor polish or wax that makes floors slippery. If you must use wax, use non-skid floor wax. Do not have throw rugs and other things on the floor that can make you trip. What can I do with my stairs? Do not leave any  items on the stairs. Make sure that there are handrails on both sides of the stairs and use them. Fix handrails that are broken or loose. Make sure that handrails are as long as the stairways. Check any carpeting to make sure that it is firmly attached to the stairs. Fix any carpet that is loose or worn. Avoid having throw rugs at the top or bottom of the stairs. If you do have throw rugs, attach them to the floor with carpet tape. Make sure that you have a light switch at the top of the stairs and the bottom of the stairs. If you do not have them, ask someone to add them for you. What else can I do to help prevent falls? Wear shoes that: Do not have high heels. Have rubber bottoms. Are comfortable and fit you well. Are closed at the toe. Do not wear sandals. If you use a stepladder: Make sure that it is fully opened. Do not climb a closed stepladder. Make sure that both sides of the  stepladder are locked into place. Ask someone to hold it for you, if possible. Clearly mark and make sure that you can see: Any grab bars or handrails. First and last steps. Where the edge of each step is. Use tools that help you move around (mobility aids) if they are needed. These include: Canes. Walkers. Scooters. Crutches. Turn on the lights when you go into a dark area. Replace any light bulbs as soon as they burn out. Set up your furniture so you have a clear path. Avoid moving your furniture around. If any of your floors are uneven, fix them. If there are any pets around you, be aware of where they are. Review your medicines with your doctor. Some medicines can make you feel dizzy. This can increase your chance of falling. Ask your doctor what other things that you can do to help prevent falls. This information is not intended to replace advice given to you by your health care provider. Make sure you discuss any questions you have with your health care provider. Document Released: 04/16/2009 Document Revised: 11/26/2015 Document Reviewed: 07/25/2014 Elsevier Interactive Patient Education  2017 [employer].

## 2022-12-09 ENCOUNTER — Telehealth: Payer: Self-pay | Admitting: Internal Medicine

## 2022-12-09 ENCOUNTER — Other Ambulatory Visit: Payer: Self-pay | Admitting: Internal Medicine

## 2022-12-09 DIAGNOSIS — G8929 Other chronic pain: Secondary | ICD-10-CM

## 2022-12-09 DIAGNOSIS — M170 Bilateral primary osteoarthritis of knee: Secondary | ICD-10-CM

## 2022-12-09 NOTE — Telephone Encounter (Signed)
Prescription Request  12/09/2022  LOV: 11/07/2022  What is the name of the medication or equipment? tramadol  Have you contacted your pharmacy to request a refill? Yes   Which pharmacy would you like this sent to?  [id number] - [place], [place] - [address] AT [facility] OF [address] [place] [address] Phone: [phone] Fax: [phone]   Patient notified that their request is being sent to the clinical staff for review and that they should receive a response within 2 business days.   Please advise at Mobile [phone] (mobile)

## 2022-12-14 NOTE — Progress Notes (Signed)
12/16/2022 Aaron [name] [id number] [birth year]  Referring provider: [name], MD Primary GI doctor: Dr. Leonides Schanz ( Dr. 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 Orvan Falconer for GAVE status post APC He has been off DOAC since 03/2022 with IVC filter, no further evidence of rebleeding, HGB stable.  No need for repeat EGD at this time, continue iron and supportive care, recheck labs today, follow up with Dr. Shirline Frees in Sept Monitor stools, call if any worsening anemia, melena, SOB, etc Follow up 6 months  GERD No symptoms Continue protonix 40 mg once daily, refilled one year  Ventricular fibrillation (HCC) During recent hospitalization with hemoglobin 4.8 had subsequent V-fib with cardiac arrest post defibrillation with ROSC 03/25/2022 cardiac catheterization mild to moderate obstructive disease within mid LAD stent 03/26/2022 EF 55-60%, normal valves 05/16/2022 follow up with Dr. [place] Felt secondary to anemia.  History of DVT s/p IVC filter Off Xarelto, on bASA Doing well, no CP, SOB  Chronic idiopathic constipation - Increase fiber/ water intake, decrease caffeine, increase activity level. -continue miralax   Patient Care Team: [name], MD as PCP - General (Internal Medicine) [place], Peter M, MD as PCP - Cardiology (Cardiology) Charna Elizabeth, MD as Consulting Physician (Gastroenterology) Colletta Maryland, RN as [facility] Care Management Duke, Roe Rutherford, PA as Physician Assistant (Cardiology)  HISTORY OF PRESENT ILLNESS: 77 y.o. male with a past medical history of anxiety, hypertension, hyperlipidemia, coronary artery disease s/p DES to the LAD 2005, V-fib arrest 03/2022 during hospitalization for  acute GI bleeding Hgb 4.8, PE/DVTs status post IVC, IDA, obesity, obstructive sleep apnea, pre-diabetes, psoriasis, GERD and others listed below presents for follow-up.  03/25/2022 cardiac catheterization mild to moderate obstructive disease within mid LAD stent 06/22/2022 seen in the office by myself after recent hospitalization with hemoglobin 4.8 transfused 3 units. EGD 03/24/2022 showed GAVE s/p APC and Hemospray Patient did have history of DVT off Xarelto status post IVC filter with IR on 03/29/2022.   08/11/2022 EGD and colonoscopy for IDA with Dr. Orvan Falconer Colonoscopy with good bowel prep showed 2 polyps 8 to 10 mm transverse colon, 6 mm polyp ascending colon, 2 mm cecum no repeat colonoscopy secondary to age EGD normal esophagus, GAVE status post APC, normal duodenum 10/17/2022 EGD in the hospital with Dr. Orvan Falconer for GAVE status post APC  He presents for follow up.  He states he is doing great, he walks with cane and other than that he feels better. No SOB, no chest pain.  He use to wake up every 2-3 hours for muscle cramps likely from iron, he wear CPAP machine and states lately he has been able to sleep well through the night.  He states he has been having BM every day or every other day.  Will take miralax that helps this.  He has been on protonix 40 mg daily, no GERD.  He denies any further melena or hematochezia.  He is on iron 325 mg once daily with water, takes vitamin C with it.  Denies AB pain, nausea, vomiting. Patient follows with Dr. Shirline Frees for supportive care for IDA, has next OV 03/2023.  CBC on 09/20/2022  HGB 12.4 MCV 88.6 Platelets 226 Anemia studies on 09/20/2022 Iron 66 Ferritin  67 B12 369  He  reports that he quit smoking about 27 years ago. His smoking use included cigarettes. He has a 37.50 pack-year smoking history. He has never used smokeless tobacco. He reports that he does not drink alcohol and does not use drugs.  Current Medications:    Current Outpatient  Medications (Cardiovascular):    amLODipine (NORVASC) 5 MG tablet, TAKE 1 TABLET BY MOUTH EVERYDAY AT BEDTIME   atorvastatin (LIPITOR) 80 MG tablet, TAKE 1 TABLET BY MOUTH ONCE  DAILY   ezetimibe (ZETIA) 10 MG tablet, TAKE 1 TABLET BY MOUTH DAILY   furosemide (LASIX) 40 MG tablet, Take 1 tablet (40 mg total) by mouth daily.   hydrALAZINE (APRESOLINE) 25 MG tablet, Take 1 tablet (25 mg total) by mouth 3 (three) times daily.   spironolactone (ALDACTONE) 25 MG tablet, Take 1 tablet (25 mg total) by mouth daily.   Current Outpatient Medications (Analgesics):    aspirin EC 81 MG tablet, Take 1 tablet (81 mg total) by mouth daily. Swallow whole.   traMADol (ULTRAM) 50 MG tablet, TAKE 2 TABLETS (100 MG TOTAL) BY MOUTH EVERY 8 HOURS AS NEEDED FOR MODERATE PAIN  Current Outpatient Medications (Hematological):    ferrous sulfate 325 (65 FE) MG EC tablet, TAKE 1 TABLET BY MOUTH DAILY WITH BREAKFAST. SCHEDULE AN APPOINTMENT FOR FURTHER REFILLS  Current Outpatient Medications (Other):    ascorbic acid (C 500/ROSE HIPS) 500 MG tablet, Take 500 mg by mouth daily.   Calcium Carb-Cholecalciferol (CALCIUM 600 + D PO), Take 1 tablet by mouth daily.   cholecalciferol (VITAMIN D3) 25 MCG (1000 UNIT) tablet, Take 1,000 Units by mouth daily.   clonazePAM (KLONOPIN) 0.5 MG tablet, TAKE 1 TABLET BY MOUTH EVERYDAY AT BEDTIME   Multiple Vitamins-Minerals (PRESERVISION AREDS 2) CAPS, Take 1 capsule by mouth 2 (two) times daily.   neomycin-bacitracin-polymyxin (NEOSPORIN) OINT, Apply 1 Application topically as needed for wound care.   polyethylene glycol (MIRALAX / GLYCOLAX) 17 g packet, Take 17 g by mouth daily. With coffee   traZODone (DESYREL) 150 MG tablet, Take 1 tablet (150 mg total) by mouth at bedtime.   venlafaxine XR (EFFEXOR-XR) 150 MG 24 hr capsule, Take 1 capsule (150 mg total) by mouth daily with breakfast.   White Petrolatum-Mineral Oil (GENTEAL TEARS NIGHT-TIME) OINT, Apply 1 Application to eye at  bedtime as needed (dry eyes).   pantoprazole (PROTONIX) 40 MG tablet, Take 1 tablet (40 mg total) by mouth daily.  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"   Heart murmur    Hyperlipidemia    Hypertension    Insomnia    Osteoarthritis    PONV (postoperative nausea and vomiting)    2006 - knee surgery   Pre-diabetes    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31   Psoriasis    PTSD (post-traumatic stress disorder)    Pulmonary embolism (HCC) ~ 03/2006   bilaterally  Seasonal allergies    Shortness of breath 06/08/2011   "w/exertion; that's why I'm here"   Sleep apnea    Sleep apnea, obstructive    Umbilical hernia    Allergies:  Allergies  Allergen Reactions   Wellbutrin [Bupropion] Other (See Comments)    Hallucinations, sweating   Amoxicillin Palpitations     Surgical History:  He  has a past surgical history that includes spinal injections (2006-2007); carpel tunnel release (Bilateral, ~ 06/2005/~ 08/2005); Coronary stent placement (02/13/2004); Inguinal hernia repair (Right, 1983); Inguinal hernia repair (Left, 1985); Tonsillectomy; Eye surgery; Eye surgery; Enucleation (Right); Eye surgery [phone]); Cardiac catheterization; Knee arthroscopy (~ 06/2005); Coronary angioplasty; Total knee arthroplasty (Left, 10/09/2015); Colonoscopy w/ biopsies and polypectomy; Multiple tooth extractions; Total knee arthroplasty (Right, 04/04/2016); LEFT HEART CATH AND CORONARY ANGIOGRAPHY (N/A, 03/25/2022); Esophagogastroduodenoscopy (egd) with propofol (N/A, 03/24/2022); TEE without cardioversion (03/24/2022); Hot hemostasis  (N/A, 03/24/2022); Hemostasis control (03/24/2022); Bubble study (03/24/2022); IR IVC FILTER PLMT / S&I /IMG GUID/MOD SED (03/29/2022); Esophagogastroduodenoscopy (egd) with propofol (N/A, 08/11/2022); Colonoscopy with propofol (N/A, 08/11/2022); Hot hemostasis (N/A, 08/11/2022); polypectomy (08/11/2022); Esophagogastroduodenoscopy (egd) with propofol (N/A, 10/17/2022); Hot hemostasis (N/A, 10/17/2022); and biopsy (10/17/2022). Family History:  His family history includes Asthma in his brother, father, and sister; Breast cancer in his sister; Emphysema in his father; Liver cancer in his sister.  REVIEW OF SYSTEMS  : All other systems reviewed and negative except where noted in the History of Present Illness.  PHYSICAL EXAM: BP 138/80 (BP Location: Left Arm, Patient Position: Sitting, Cuff Size: Normal)   Pulse 86   Ht 5\' 6"  (1.676 m)   Wt 238 lb 8 oz (108.2 kg)   SpO2 97%   BMI 38.49 kg/m  General:   Pleasant, obese male in no acute distress Head:   Normocephalic and atraumatic. Eyes:  sclerae anicteric,conjunctive pink  Heart:   regular rate and rhythm Pulm:  Clear anteriorly; no wheezing Abdomen:   Soft, Obese AB, Active bowel sounds. No tenderness, No organomegaly appreciated. Rectal: Not evaluated Extremities:  With 1-2 edema. Msk: Symmetrical without gross deformities. Peripheral pulses intact.  Walks with cane unable to get on table. Neurologic:  Alert and  oriented x4;  No focal deficits.  Skin:   Dry and intact without significant lesions or rashes. Psychiatric:  Cooperative. Normal mood and affect.    Doree Albee, PA-C 11:27 AM

## 2022-12-16 ENCOUNTER — Other Ambulatory Visit (INDEPENDENT_AMBULATORY_CARE_PROVIDER_SITE_OTHER): Payer: Medicare Other

## 2022-12-16 ENCOUNTER — Ambulatory Visit: Payer: Medicare Other | Admitting: Physician Assistant

## 2022-12-16 ENCOUNTER — Encounter: Payer: Self-pay | Admitting: Physician Assistant

## 2022-12-16 VITALS — BP 138/80 | HR 86 | Ht 66.0 in | Wt 238.5 lb

## 2022-12-16 DIAGNOSIS — D509 Iron deficiency anemia, unspecified: Secondary | ICD-10-CM | POA: Diagnosis not present

## 2022-12-16 DIAGNOSIS — I824Y9 Acute embolism and thrombosis of unspecified deep veins of unspecified proximal lower extremity: Secondary | ICD-10-CM | POA: Diagnosis not present

## 2022-12-16 DIAGNOSIS — K219 Gastro-esophageal reflux disease without esophagitis: Secondary | ICD-10-CM

## 2022-12-16 DIAGNOSIS — K31819 Angiodysplasia of stomach and duodenum without bleeding: Secondary | ICD-10-CM | POA: Diagnosis not present

## 2022-12-16 DIAGNOSIS — E538 Deficiency of other specified B group vitamins: Secondary | ICD-10-CM | POA: Diagnosis not present

## 2022-12-16 DIAGNOSIS — Z86010 Personal history of colonic polyps: Secondary | ICD-10-CM

## 2022-12-16 LAB — COMPREHENSIVE METABOLIC PANEL
ALT: 17 U/L (ref 0–53)
AST: 16 U/L (ref 0–37)
Albumin: 4.1 g/dL (ref 3.5–5.2)
Alkaline Phosphatase: 102 U/L (ref 39–117)
BUN: 27 mg/dL — ABNORMAL HIGH (ref 6–23)
CO2: 25 mEq/L (ref 19–32)
Calcium: 9.2 mg/dL (ref 8.4–10.5)
Chloride: 104 mEq/L (ref 96–112)
Creatinine, Ser: 1.11 mg/dL (ref 0.40–1.50)
GFR: 64.26 mL/min (ref >60.00)
Glucose, Bld: 121 mg/dL — ABNORMAL HIGH (ref 70–99)
Potassium: 4.1 mEq/L (ref 3.5–5.1)
Sodium: 140 mEq/L (ref 135–145)
Total Bilirubin: 0.4 mg/dL (ref 0.2–1.2)
Total Protein: 7.4 g/dL (ref 6.0–8.3)

## 2022-12-16 LAB — CBC WITH DIFFERENTIAL/PLATELET
Basophils Absolute: 0.1 10*3/uL (ref 0.0–0.1)
Basophils Relative: 1.1 % (ref 0.0–3.0)
Eosinophils Absolute: 0.1 10*3/uL (ref 0.0–0.7)
Eosinophils Relative: 2.3 % (ref 0.0–5.0)
HCT: 37.6 % — ABNORMAL LOW (ref 39.0–52.0)
Hemoglobin: 12.7 g/dL — ABNORMAL LOW (ref 13.0–17.0)
Lymphocytes Relative: 13.8 % (ref 12.0–46.0)
Lymphs Abs: 0.8 10*3/uL (ref 0.7–4.0)
MCHC: 33.6 g/dL (ref 30.0–36.0)
MCV: 88 fl (ref 78.0–100.0)
Monocytes Absolute: 0.6 10*3/uL (ref 0.1–1.0)
Monocytes Relative: 9.7 % (ref 3.0–12.0)
Neutro Abs: 4.4 10*3/uL (ref 1.4–7.7)
Neutrophils Relative %: 73.1 % (ref 43.0–77.0)
Platelets: 234 10*3/uL (ref 150.0–400.0)
RBC: 4.28 Mil/uL (ref 4.22–5.81)
RDW: 13 % (ref 11.5–15.5)
WBC: 6.1 10*3/uL (ref 4.0–10.5)

## 2022-12-16 LAB — IBC + FERRITIN
Ferritin: 71.3 ng/mL (ref 22.0–322.0)
Iron: 81 ug/dL (ref 42–165)
Saturation Ratios: 30.1 % (ref 20.0–50.0)
TIBC: 268.8 ug/dL (ref 250.0–450.0)
Transferrin: 192 mg/dL — ABNORMAL LOW (ref 212.0–360.0)

## 2022-12-16 LAB — VITAMIN B12: Vitamin B-12: 387 pg/mL (ref 211–911)

## 2022-12-16 MED ORDER — PANTOPRAZOLE SODIUM 40 MG PO TBEC: 40.0000 mg | DELAYED_RELEASE_TABLET | Freq: Every day | ORAL | 3 refills | Status: DC

## 2022-12-16 NOTE — Patient Instructions (Addendum)
Your provider has requested that you go to the basement level for lab work before leaving today. Press "B" on the elevator. The lab is located at the first door on the left as you exit the elevator.   Continue protonix 40 mg once a day 30 mins before food  Please call if you have any black stools or abnormal stools or go to the ER if you have any shortness of breath or chest pain.   Continue follow up with Dr. Shirline Frees Need to monitor you CBC and iron every 3-6 months Continue your iron supplement  Due to recent changes in healthcare laws, you may see the results of your imaging and laboratory studies on MyChart before your provider has had a chance to review them.  We understand that in some cases there may be results that are confusing or concerning to you. Not all laboratory results come back in the same time frame and the provider may be waiting for multiple results in order to interpret others.  Please give [place] 48 hours in order for your provider to thoroughly review all the results before contacting the office for clarification of your results.    I appreciate the  opportunity to care for you  Thank You   [facility]

## 2022-12-16 NOTE — Progress Notes (Signed)
I agree with the assessment and plan as outlined by Ms. Collier. 

## 2022-12-19 DIAGNOSIS — L84 Corns and callosities: Secondary | ICD-10-CM | POA: Diagnosis not present

## 2022-12-19 DIAGNOSIS — L603 Nail dystrophy: Secondary | ICD-10-CM | POA: Diagnosis not present

## 2022-12-19 DIAGNOSIS — I739 Peripheral vascular disease, unspecified: Secondary | ICD-10-CM | POA: Diagnosis not present

## 2022-12-24 ENCOUNTER — Other Ambulatory Visit: Payer: Self-pay | Admitting: Internal Medicine

## 2022-12-24 DIAGNOSIS — I2510 Atherosclerotic heart disease of native coronary artery without angina pectoris: Secondary | ICD-10-CM

## 2022-12-24 DIAGNOSIS — E785 Hyperlipidemia, unspecified: Secondary | ICD-10-CM

## 2023-01-04 ENCOUNTER — Other Ambulatory Visit: Payer: Self-pay | Admitting: Internal Medicine

## 2023-01-04 DIAGNOSIS — F411 Generalized anxiety disorder: Secondary | ICD-10-CM

## 2023-01-04 DIAGNOSIS — F32A Depression, unspecified: Secondary | ICD-10-CM

## 2023-01-23 DIAGNOSIS — H10022 Other mucopurulent conjunctivitis, left eye: Secondary | ICD-10-CM | POA: Diagnosis not present

## 2023-01-28 NOTE — Progress Notes (Signed)
Cardiology Office Note:    Date:  02/08/2023   ID:  Aaron [name], DOB [birth year], MRN [id number]  PCP:  [name], MD   [facility] Providers Cardiologist:  Peter Swaziland, MD Cardiology APP:  Marcelino Duster, [place] { Referring MD: [name], MD   Chief Complaint  Patient presents with   Follow-up    Vfib arrest    History of Present Illness:    Aaron [name] is a 77 y.o. male with a hx of hypertension, coronary artery disease, recurrent PE/DVTs on xarelto, GERD, hyperlipidemia, and obesity.  He has a history of stenting to his mid LAD in 2005 following a nuclear stress test in that showed evidence of anterior apical ischemia.  Follow-up Myoview in 2014 was normal.  He was anticoagulated on Xarelto for recurrent PE/DVT, but with a history of chronic IDA requiring transfusions.  Hypertension was managed by his PCP. He was hospitalized 03/2022 with an in-hospital Vfib arrest in the setting of GI bleed and Hb of 4.8. Initial EKG with ST depressions. LHC revealed nonobstructive CAD, no intervention. Telemetry with sinus rhythm. Suspected a reversible etiology in the setting of severe anemia. However, due to risk of re-bleed, he was discharged with a lifevest.  GI workup notable for EGD showing gastric antral vascular ectasia treated with APC, felt to be extremely high risk for re-bleed. GI recommended complete discontinuation of OAC. Therefore, Xarelto held and IVC filter was placed by IR.   He was seen in [facility] clinic with mild volume overload. Low dose entresto was added along with continuation of jardiance, spiro, and lasix 40 mg. Last seen by Dr. Swaziland 10/2022. I discontinued his lifevest as there were no arrhythmias noted. Vfib arrest felt secondary to significant anemia. He as admitted 10/17/2022 with GAVE treated with APC.   Now off entresto and jardiance Currently: amlodipine, lasix, hydralazine, spironolactone  He presents for routine follow up. Overall he is  doing quite well. He forgets the mid-day dose of hydralazine. His medication regimen fluctuates. We discussed taking a BP log about 2 hrs before his PM medications (which is about 10 hrs after his morning medications). If BP elevated, may just increased amlodipine and stop hydralazine. No chest pain, no bleeding. No cardiac complaints.   He tells me his furnace went out in January, he is unable to fix his car, furnace/AC, and is making monthly medical bill payments. Apparently he was make monthly payments to [facility] but they turned him over to collections because the monthly payments weren't high enough. I have offered to reach out to social work to see if there is any assistance.    Past Medical History:  Diagnosis Date   Anemia    "@ birth"   Anxiety    Asthma    Blood transfusion    "@ birth"   Cancer 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88   Psoriasis    PTSD (post-traumatic stress disorder)    Pulmonary embolism (HCC) ~ 03/2006   bilaterally   Seasonal allergies    Shortness of breath 06/08/2011   "w/exertion; that's why I'm here"   Sleep apnea    Sleep apnea, obstructive    Umbilical hernia     Past Surgical History:  Procedure Laterality Date   BIOPSY  10/17/2022   Procedure: BIOPSY;  Surgeon: Tressia Danas, MD;  Location: [facility] ENDOSCOPY;  Service: Gastroenterology;;   Thressa Sheller STUDY  03/24/2022   Procedure: BUBBLE STUDY;  Surgeon: Tressia Danas, MD;  Location: [facility] ENDOSCOPY;  Service: Gastroenterology;;   CARDIAC CATHETERIZATION      carpel tunnel release Bilateral ~ 06/2005/~ 08/2005   right/left   COLONOSCOPY W/ BIOPSIES AND POLYPECTOMY     COLONOSCOPY WITH PROPOFOL N/A 08/11/2022   Procedure: COLONOSCOPY WITH PROPOFOL;  Surgeon: Tressia Danas, MD;  Location: [facility] ENDOSCOPY;  Service: Gastroenterology;  Laterality: N/A;   CORONARY ANGIOPLASTY     CORONARY STENT PLACEMENT  02/13/2004   1   ENUCLEATION Right    age 42   ESOPHAGOGASTRODUODENOSCOPY (EGD) WITH PROPOFOL N/A 03/24/2022   Procedure: ESOPHAGOGASTRODUODENOSCOPY (EGD) WITH PROPOFOL;  Surgeon: Tressia Danas, MD;  Location: [facility] ENDOSCOPY;  Service: Gastroenterology;  Laterality: N/A;   ESOPHAGOGASTRODUODENOSCOPY (EGD) WITH PROPOFOL N/A 08/11/2022   Procedure: ESOPHAGOGASTRODUODENOSCOPY (EGD) WITH PROPOFOL;  Surgeon: Tressia Danas, MD;  Location: [facility] ENDOSCOPY;  Service: Gastroenterology;  Laterality: N/A;   ESOPHAGOGASTRODUODENOSCOPY (EGD) WITH PROPOFOL N/A 10/17/2022   Procedure: ESOPHAGOGASTRODUODENOSCOPY (EGD) WITH PROPOFOL;  Surgeon: Tressia Danas, MD;  Location: [facility] ENDOSCOPY;  Service: Gastroenterology;  Laterality: N/A;   EYE SURGERY     age 74; "ruptured  right pupil"   EYE SURGERY     age 63   EYE SURGERY  1957   placed artificial right eye   HEMOSTASIS CONTROL  03/24/2022   Procedure: HEMOSTASIS CONTROL;  Surgeon: Tressia Danas, MD;  Location: [facility] ENDOSCOPY;  Service: Gastroenterology;;   HOT HEMOSTASIS N/A 03/24/2022   Procedure: HOT HEMOSTASIS (ARGON PLASMA COAGULATION/BICAP);  Surgeon: Tressia Danas, MD;  Location: [facility] ENDOSCOPY;  Service: Gastroenterology;  Laterality: N/A;   HOT HEMOSTASIS N/A 08/11/2022   Procedure: HOT HEMOSTASIS (ARGON PLASMA COAGULATION/BICAP);  Surgeon: Tressia Danas, MD;  Location: [facility] ENDOSCOPY;  Service: Gastroenterology;  Laterality: N/A;  stomach   HOT HEMOSTASIS N/A 10/17/2022   Procedure: HOT HEMOSTASIS (ARGON PLASMA COAGULATION/BICAP);  Surgeon: Tressia Danas, MD;  Location: [facility] ENDOSCOPY;  Service:  Gastroenterology;  Laterality: N/A;   INGUINAL HERNIA REPAIR Right 1983   INGUINAL HERNIA REPAIR Left 1985   IR IVC FILTER PLMT / S&I /IMG GUID/MOD SED  03/29/2022   KNEE ARTHROSCOPY  ~ 06/2005   right   LEFT HEART CATH AND CORONARY ANGIOGRAPHY N/A 03/25/2022   Procedure: LEFT HEART CATH AND CORONARY ANGIOGRAPHY;  Surgeon: Orbie Pyo, MD;  Location: [facility] INVASIVE CV LAB;  Service: Cardiovascular;  Laterality: N/A;   MULTIPLE TOOTH EXTRACTIONS     POLYPECTOMY  08/11/2022   Procedure: POLYPECTOMY;  Surgeon: Tressia Danas, MD;  Location: [facility] ENDOSCOPY;  Service: Gastroenterology;;  colon   spinal injections  2006-2007   "maybe 10 injections for herniated discs"   TEE WITHOUT CARDIOVERSION  03/24/2022   Procedure: TRANSESOPHAGEAL ECHOCARDIOGRAM (TEE);  Surgeon: Tressia Danas, MD;  Location: [facility] ENDOSCOPY;  Service: Gastroenterology;;   TONSILLECTOMY     "when I was real young"   TOTAL KNEE ARTHROPLASTY Left  10/09/2015   Procedure: TOTAL KNEE ARTHROPLASTY;  Surgeon: Jodi Geralds, MD;  Location: [facility] OR;  Service: Orthopedics;  Laterality: Left;   TOTAL KNEE ARTHROPLASTY Right 04/04/2016   Procedure: TOTAL KNEE ARTHROPLASTY;  Surgeon: Jodi Geralds, MD;  Location: [facility] OR;  Service: Orthopedics;  Laterality: Right;    Current Medications: Current Meds  Medication Sig   amLODipine (NORVASC) 5 MG tablet TAKE 1 TABLET BY MOUTH EVERYDAY AT BEDTIME   ascorbic acid (C 500/ROSE HIPS) 500 MG tablet Take 500 mg by mouth daily.   aspirin EC 81 MG tablet Take 1 tablet (81 mg total) by mouth daily. Swallow whole.   atorvastatin (LIPITOR) 80 MG tablet TAKE 1 TABLET BY MOUTH ONCE  DAILY   Calcium Carb-Cholecalciferol (CALCIUM 600 + D PO) Take 1 tablet by mouth daily.   cholecalciferol (VITAMIN D3) 25 MCG (1000 UNIT) tablet Take 1,000 Units by mouth daily.   clonazePAM (KLONOPIN) 0.5 MG tablet TAKE 1 TABLET BY MOUTH EVERYDAY AT BEDTIME   ezetimibe (ZETIA) 10 MG tablet TAKE 1 TABLET BY MOUTH DAILY   ferrous  sulfate 325 (65 FE) MG EC tablet TAKE 1 TABLET BY MOUTH DAILY WITH BREAKFAST. SCHEDULE AN APPOINTMENT FOR FURTHER REFILLS   furosemide (LASIX) 40 MG tablet Take 1 tablet (40 mg total) by mouth daily.   hydrALAZINE (APRESOLINE) 25 MG tablet Take 1 tablet (25 mg total) by mouth 3 (three) times daily.   Multiple Vitamins-Minerals (PRESERVISION AREDS 2) CAPS Take 1 capsule by mouth 2 (two) times daily.   neomycin-bacitracin-polymyxin (NEOSPORIN) OINT Apply 1 Application topically as needed for wound care.   pantoprazole (PROTONIX) 40 MG tablet Take 1 tablet (40 mg total) by mouth daily.   polyethylene glycol (MIRALAX / GLYCOLAX) 17 g packet Take 17 g by mouth daily. With coffee   spironolactone (ALDACTONE) 25 MG tablet Take 1 tablet (25 mg total) by mouth daily.   traMADol (ULTRAM) 50 MG tablet TAKE 2 TABLETS (100 MG TOTAL) BY MOUTH EVERY 8 HOURS AS NEEDED FOR MODERATE PAIN   traZODone (DESYREL) 150 MG tablet Take 1 tablet (150 mg total) by mouth at bedtime.   venlafaxine XR (EFFEXOR-XR) 150 MG 24 hr capsule TAKE 1 CAPSULE BY MOUTH DAILY  WITH BREAKFAST   White Petrolatum-Mineral Oil (GENTEAL TEARS NIGHT-TIME) OINT Apply 1 Application to eye at bedtime as needed (dry eyes).     Allergies:   Wellbutrin [bupropion] and Amoxicillin   Social History   Socioeconomic History   Marital status: Married    Spouse name: Not on file   Number of children: 3   Years of education: Not on file   Highest education level: GED or equivalent  Occupational History   Occupation: building maintenance    Comment: disabled  Tobacco Use   Smoking status: Former    Current packs/day: 0.00    Average packs/day: 1.5 packs/day for 25.0 years (37.5 ttl pk-yrs)    Types: Cigarettes    Start date: 07/09/1970    Quit date: 07/10/1995    Years since quitting: 27.6   Smokeless tobacco: Never  Vaping Use   Vaping status: Never Used  Substance and Sexual Activity   Alcohol use: No   Drug use: No   Sexual activity: Not  on file  Other Topics Concern   Not on file  Social History Narrative   Former Education administrator, 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   Recent stress, currently undergoing financial difficulties.   Social Determinants of Health   Financial  Resource Strain: Low Risk  (12/05/2022)   Overall Financial Resource Strain (CARDIA)    Difficulty of Paying Living Expenses: Not very hard  Recent Concern: Financial Resource Strain - Medium Risk (11/03/2022)   Overall Financial Resource Strain (CARDIA)    Difficulty of Paying Living Expenses: Somewhat hard  Food Insecurity: No Food Insecurity (12/05/2022)   Hunger Vital Sign    Worried About Running Out of Food in the Last Year: Never true    Ran Out of Food in the Last Year: Never true  Transportation Needs: No Transportation Needs (12/05/2022)   PRAPARE - Administrator, Civil Service (Medical): No    Lack of Transportation (Non-Medical): No  Physical Activity: Inactive (12/05/2022)   Exercise Vital Sign    Days of Exercise per Week: 0 days    Minutes of Exercise per Session: 0 min  Stress: No Stress Concern Present (12/05/2022)   [employer] of Occupational Health - Occupational Stress Questionnaire    Feeling of Stress : Only a little  Recent Concern: Stress - Stress Concern Present (11/03/2022)   [employer] of Occupational Health - Occupational Stress Questionnaire    Feeling of Stress : To some extent  Social Connections: Unknown (12/05/2022)   Social Connection and Isolation Panel [NHANES]    Frequency of Communication with Friends and Family: Patient declined    Frequency of Social Gatherings with Friends and Family: Patient declined    Attends Religious Services: Patient declined    Database administrator or Organizations: No    Attends Engineer, structural: Never    Marital Status: Married  Recent Concern: Social Connections - Socially Isolated (11/03/2022)   Social Connection and Isolation Panel [NHANES]     Frequency of Communication with Friends and Family: Once a week    Frequency of Social Gatherings with Friends and Family: Never    Attends Religious Services: Never    Database administrator or Organizations: No    Attends Engineer, structural: Not on file    Marital Status: Married     Family History: The patient's family history includes Asthma in his brother, father, and sister; Breast cancer in his sister; Emphysema in his father; Liver cancer in his sister. There is no history of Colon cancer, Esophageal cancer, Colon polyps, Rectal cancer, or Stomach cancer.  ROS:   Please see the history of present illness.     All other systems reviewed and are negative.  EKGs/Labs/Other Studies Reviewed:    The following studies were reviewed today:  Left heart cath 03/25/22:   Dist LAD lesion is 10% stenosed.   1st Mrg lesion is 30% stenosed.   1.  Diffuse mild to moderate obstructive disease with patent mid LAD stent. 2.  Elevated LVEDP of 25 mmHg.   Commendation: Continued medical therapy. EKG Interpretation Date/Time:  Wednesday February 08 2023 15:37:12 EDT Ventricular Rate:  70 PR Interval:  200 QRS Duration:  102 QT Interval:  396 QTC Calculation: 427 R Axis:   74  Text Interpretation: sinus rhythm with junctional beats When compared with ECG of 24-Mar-2022 05:27, PREVIOUS ECG IS PRESENT Confirmed by Micah Flesher ([id number]) on 02/08/2023 4:29:07 PM    Recent Labs: 02/09/2022: TSH 2.44 04/18/2022: BNP 42.0; Magnesium 2.3 12/16/2022: ALT 17; BUN 27; Creatinine, Ser 1.11; Hemoglobin 12.7; Platelets 234.0; Potassium 4.1; Sodium 140  Recent Lipid Panel    Component Value Date/Time   CHOL 126 08/10/2021 1520   CHOL 151 12/17/2020 1017  TRIG 823 (H) 03/25/2022 0517   HDL 37.70 (L) 08/10/2021 1520   HDL 40 12/17/2020 1017   CHOLHDL 3 08/10/2021 1520   VLDL 42.8 (H) 08/10/2021 1520   LDLCALC 89 12/17/2020 1017   LDLDIRECT 62.0 08/10/2021 1520     Risk  Assessment/Calculations:               Physical Exam:    VS:  BP 106/68   Pulse 70   Ht 5\' 6"  (1.676 m)   Wt 238 lb (108 kg)   SpO2 97%   BMI 38.41 kg/m     Wt Readings from Last 3 Encounters:  02/08/23 238 lb (108 kg)  12/16/22 238 lb 8 oz (108.2 kg)  12/05/22 237 lb (107.5 kg)     GEN:  Well nourished, well developed in no acute distress HEENT: Normal NECK: No JVD; No carotid bruits LYMPHATICS: No lymphadenopathy CARDIAC: RRR, no murmurs, rubs, gallops RESPIRATORY:  Clear to auscultation without rales, wheezing or rhonchi  ABDOMEN: Soft, non-tender, non-distended MUSCULOSKELETAL:  No edema; No deformity  SKIN: Warm and dry NEUROLOGIC:  Alert and oriented x 3 PSYCHIATRIC:  Normal affect   ASSESSMENT:    1. Coronary artery disease involving native coronary artery of native heart without angina pectoris   2. Primary hypertension   3. Hyperlipidemia with target LDL less than 70   4. Chronic diastolic heart failure (HCC)   5. Deep vein thrombosis (DVT) of proximal lower extremity, unspecified chronicity, unspecified laterality (HCC)    PLAN:    In order of problems listed above:  Vfib arrest - in hospital Lifevest was discontinued - felt secondary to significant anemia - has received no shocks, no arrhythmias, lifevest discontinued after 6 weeks - doing well   NSTEMI - demand ischemia CAD s/p LAD PCI Heart cath 2023 with nonobstructive disease Continue risk factor management   Hyperlipidemia with LDL goal < 70 03/25/2022: Triglycerides 823 - will repeat fasting labs   Chronic diastolic heart failure Echo with preserved EF and normal RV Was mildly hypervolemic 04/06/22 - added entresto 24-26 mg BID, continue 25 mg spironolactone and 40 mg daily lasix - he was taken off entresto, could not afford jardiance - he was advised to use TED hose - he has since been taken off of entresto and jardiance   Hx of DVT/PE Chronic IDA with frequent blood  transfusions Previously on xarelto - recommended for discontinuation of OAC given bleeding hx and chronic IDA Now with IVC filter in place   Hypertension Current medications: 5 mg amlodipine, 40 mg lasix, 25 mg hydralazine BID (forgets TID), 25 mg spironolactone - if BP elevated mid-day, may decide to D/C hydralazine and increase amlodipine   Social: - he currently has no A/C in his home and can't afford to fix his car - 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  Concerns regarding medicines are outlined above.  Orders Placed This Encounter  Procedures   Lipid panel   Hepatic function panel   Referral to HRT/VAS Care Navigation   EKG 12-Lead   No orders of the defined types were placed in this encounter.   Patient Instructions  Medication  Instructions:   Your physician recommends that you continue on your current medications as directed. Please refer to the Current Medication list given to you today.   *If you need a refill on your cardiac medications before your next appointment, please call your pharmacy*   Lab Work: Return for fasting labs  If you have labs (blood work) drawn today and your tests are completely normal, you will receive your results only by: MyChart Message (if you have MyChart) OR A paper copy in the mail If you have any lab test that is abnormal or we need to change your treatment, we will call you to review the results.   Testing/Procedures: none   Follow-Up: At [facility], you and your health needs are our priority.  As part of our continuing mission to provide you with exceptional heart care, we have created designated Provider Care Teams.  These Care Teams  include your primary Cardiologist (physician) and Advanced Practice Providers (APPs -  Physician Assistants and Nurse Practitioners) who all work together to provide you with the care you need, when you need it.  We recommend signing up for the patient portal called "MyChart".  Sign up information is provided on this After Visit Summary.  MyChart is used to connect with patients for Virtual Visits (Telemedicine).  Patients are able to view lab/test results, encounter notes, upcoming appointments, etc.  Non-urgent messages can be sent to your provider as well.   To learn more about what you can do with MyChart, go to [email or web address].    Your next appointment:   6 month(s)  Provider:   Peter Swaziland, MD        Signed, Roe Rutherford , [place]  02/08/2023 5:29 PM    [facility]

## 2023-01-31 DIAGNOSIS — H10022 Other mucopurulent conjunctivitis, left eye: Secondary | ICD-10-CM | POA: Diagnosis not present

## 2023-02-08 ENCOUNTER — Encounter: Payer: Self-pay | Admitting: Physician Assistant

## 2023-02-08 ENCOUNTER — Ambulatory Visit: Payer: Medicare Other | Attending: Physician Assistant | Admitting: Physician Assistant

## 2023-02-08 VITALS — BP 106/68 | HR 70 | Ht 66.0 in | Wt 238.0 lb

## 2023-02-08 DIAGNOSIS — I10 Essential (primary) hypertension: Secondary | ICD-10-CM | POA: Diagnosis not present

## 2023-02-08 DIAGNOSIS — I824Y9 Acute embolism and thrombosis of unspecified deep veins of unspecified proximal lower extremity: Secondary | ICD-10-CM

## 2023-02-08 DIAGNOSIS — I2510 Atherosclerotic heart disease of native coronary artery without angina pectoris: Secondary | ICD-10-CM

## 2023-02-08 DIAGNOSIS — E785 Hyperlipidemia, unspecified: Secondary | ICD-10-CM

## 2023-02-08 DIAGNOSIS — I5032 Chronic diastolic (congestive) heart failure: Secondary | ICD-10-CM | POA: Diagnosis not present

## 2023-02-08 NOTE — Patient Instructions (Addendum)
Medication Instructions:   Your physician recommends that you continue on your current medications as directed. Please refer to the Current Medication list given to you today.   *If you need a refill on your cardiac medications before your next appointment, please call your pharmacy*   Lab Work: Return for fasting labs  If you have labs (blood work) drawn today and your tests are completely normal, you will receive your results only by: MyChart Message (if you have MyChart) OR A paper copy in the mail If you have any lab test that is abnormal or we need to change your treatment, we will call you to review the results.   Testing/Procedures: none   Follow-Up: At [facility], you and your health needs are our priority.  As part of our continuing mission to provide you with exceptional heart care, we have created designated Provider Care Teams.  These Care Teams include your primary Cardiologist (physician) and Advanced Practice Providers (APPs -  Physician Assistants and Nurse Practitioners) who all work together to provide you with the care you need, when you need it.  We recommend signing up for the patient portal called "MyChart".  Sign up information is provided on this After Visit Summary.  MyChart is used to connect with patients for Virtual Visits (Telemedicine).  Patients are able to view lab/test results, encounter notes, upcoming appointments, etc.  Non-urgent messages can be sent to your provider as well.   To learn more about what you can do with MyChart, go to [email or web address].    Your next appointment:   6 month(s)  Provider:   Peter Swaziland, MD

## 2023-02-09 ENCOUNTER — Other Ambulatory Visit: Payer: Self-pay | Admitting: Internal Medicine

## 2023-02-09 DIAGNOSIS — M170 Bilateral primary osteoarthritis of knee: Secondary | ICD-10-CM

## 2023-02-09 DIAGNOSIS — M545 Low back pain, unspecified: Secondary | ICD-10-CM

## 2023-02-10 ENCOUNTER — Telehealth (HOSPITAL_COMMUNITY): Payer: Self-pay | Admitting: Licensed Clinical Social Worker

## 2023-02-10 NOTE — Telephone Encounter (Signed)
CSW contacted patient to follow up on referral regarding finances. CSW left message for return call. Lasandra Beech, LCSW, CCSW-MCS [phone]

## 2023-03-06 ENCOUNTER — Other Ambulatory Visit: Payer: Self-pay | Admitting: Internal Medicine

## 2023-03-06 DIAGNOSIS — G4700 Insomnia, unspecified: Secondary | ICD-10-CM

## 2023-03-06 DIAGNOSIS — F411 Generalized anxiety disorder: Secondary | ICD-10-CM

## 2023-03-07 ENCOUNTER — Other Ambulatory Visit: Payer: Self-pay | Admitting: Internal Medicine

## 2023-03-07 DIAGNOSIS — I2510 Atherosclerotic heart disease of native coronary artery without angina pectoris: Secondary | ICD-10-CM

## 2023-03-07 DIAGNOSIS — E785 Hyperlipidemia, unspecified: Secondary | ICD-10-CM

## 2023-03-18 ENCOUNTER — Other Ambulatory Visit: Payer: Self-pay | Admitting: Internal Medicine

## 2023-03-18 DIAGNOSIS — G4700 Insomnia, unspecified: Secondary | ICD-10-CM

## 2023-03-18 DIAGNOSIS — F411 Generalized anxiety disorder: Secondary | ICD-10-CM

## 2023-03-18 DIAGNOSIS — F32A Depression, unspecified: Secondary | ICD-10-CM

## 2023-03-19 NOTE — Progress Notes (Deleted)
[facility] OFFICE PROGRESS NOTE  Aaron Grandchild, MD [address] [place] [address]  DIAGNOSIS: Microcytic anemia secondary to iron deficiency secondary to gastrointestinal hemorrhage.   PRIOR THERAPY: Iron infusion with Venofer 300 Mg IV weekly on as-needed basis. Most recent dose on 05/17/22  CURRENT THERAPY: Ferrous sulfate 325 mg p.o. daily.   INTERVAL HISTORY: Aaron [name] 77 y.o. male returns to the clinic for a 6 month follow up visit. The patient was last seen by Dr. Arbutus Ped on 09/20/22 the patient is followed for his history of iron deficiency anemia secondary to GAEE.  He follows with gastroenterology.  His most recent EGD was performed by Dr. Orvan Falconer on 10/17/2022 and he is status post APC.  He has been off DOAC since September 2019.  He had an IVC filter placed..  The patient receives supportive care through our clinic on an as-needed basis.  His last IV iron infusion was in November 2023.  He is compliant with his ferrous sulfate p.o. daily.  Today, the patient states he is feeling ***.  Denies any ***.  Fatigue?  Lightheadedness?  Shortness of breath?  He denies any abnormal visible bleeding.  He is here today for evaluation repeat blood work.     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  MEDICATIONS:  Current Outpatient Medications  Medication Sig Dispense Refill   amLODipine (NORVASC) 5 MG tablet TAKE 1 TABLET BY MOUTH EVERYDAY AT BEDTIME 90 tablet 0   ascorbic acid (C 500/ROSE HIPS) 500 MG tablet Take 500 mg by mouth daily.     aspirin EC 81 MG tablet Take 1 tablet (81 mg total) by mouth daily. Swallow whole. 30 tablet 0   atorvastatin (LIPITOR) 80 MG tablet TAKE 1 TABLET BY MOUTH ONCE  DAILY 100 tablet 0   Calcium Carb-Cholecalciferol (CALCIUM 600 + D PO) Take 1 tablet by mouth daily.     cholecalciferol (VITAMIN D3) 25 MCG (1000 UNIT) tablet Take 1,000 Units by mouth daily.     clonazePAM (KLONOPIN) 0.5 MG tablet TAKE 1 TABLET BY MOUTH EVERYDAY AT BEDTIME 90 tablet 2   ezetimibe (ZETIA) 10 MG tablet TAKE 1 TABLET BY MOUTH DAILY 100 tablet 2   ferrous sulfate  325 (65 FE) MG EC tablet TAKE 1 TABLET BY MOUTH DAILY WITH BREAKFAST. SCHEDULE AN APPOINTMENT FOR FURTHER REFILLS 90 tablet 3   furosemide (LASIX) 40 MG tablet Take 1 tablet (40 mg total) by mouth daily. 90 tablet 3   hydrALAZINE (APRESOLINE) 25 MG tablet Take 1 tablet (25 mg total) by mouth 3 (three) times daily. 270 tablet 3   Multiple Vitamins-Minerals (PRESERVISION AREDS 2) CAPS Take 1 capsule by mouth 2 (two) times daily.     neomycin-bacitracin-polymyxin (NEOSPORIN) OINT Apply 1 Application topically as needed for wound care.     pantoprazole (PROTONIX) 40 MG tablet Take 1 tablet (40 mg total) by mouth daily. 90 tablet 3   polyethylene glycol (MIRALAX / GLYCOLAX) 17 g packet Take 17 g by mouth daily. With coffee     spironolactone (ALDACTONE) 25 MG tablet Take 1 tablet (25 mg total) by mouth daily. 90 tablet 3   traMADol (ULTRAM) 50 MG tablet TAKE 2  TABLETS (100 MG TOTAL) BY MOUTH EVERY 8 HOURS AS NEEDED FOR MODERATE PAIN 180 tablet 1   traZODone (DESYREL) 150 MG tablet Take 1 tablet (150 mg total) by mouth at bedtime. 90 tablet 1   venlafaxine XR (EFFEXOR-XR) 150 MG 24 hr capsule TAKE 1 CAPSULE BY MOUTH DAILY  WITH BREAKFAST 100 capsule 0   White Petrolatum-Mineral Oil (GENTEAL TEARS NIGHT-TIME) OINT Apply 1 Application to eye at bedtime as needed (dry eyes).     No current facility-administered medications for this visit.    SURGICAL HISTORY:  Past Surgical History:  Procedure Laterality Date   BIOPSY  10/17/2022   Procedure: BIOPSY;  Surgeon: Tressia Danas, MD;  Location: [facility] ENDOSCOPY;  Service: Gastroenterology;;   BUBBLE STUDY  03/24/2022   Procedure: BUBBLE STUDY;  Surgeon: Tressia Danas, MD;  Location[facility] ENDOSCOPY;  Service: Gastroenterology;;   CARDIAC CATHETERIZATION     carpel tunnel release Bilateral ~ 06/2005/~ 08/2005   right/left   COLONOSCOPY W/ BIOPSIES AND POLYPECTOMY     COLONOSCOPY WITH PROPOFOL N/A 08/11/2022   Procedure: COLONOSCOPY WITH PROPOFOL;  Surgeon: Tressia Danas, MD;  Location: [facility] ENDOSCOPY;  Service: Gastroenterology;  Laterality: N/A;   CORONARY ANGIOPLASTY     CORONARY STENT PLACEMENT  02/13/2004   1   ENUCLEATION Right    age 38   ESOPHAGOGASTRODUODENOSCOPY (EGD) WITH PROPOFOL N/A 03/24/2022   Procedure: ESOPHAGOGASTRODUODENOSCOPY (EGD) WITH PROPOFOL;  Surgeon: Tressia Danas, MD;  Location[facility] ENDOSCOPY;  Service: Gastroenterology;  Laterality: N/A;   ESOPHAGOGASTRODUODENOSCOPY (EGD) WITH PROPOFOL N/A 08/11/2022   Procedure: ESOPHAGOGASTRODUODENOSCOPY (EGD) WITH PROPOFOL;  Surgeon: Tressia Danas, MD;  Location: [facility] ENDOSCOPY;  Service: Gastroenterology;  Laterality: N/A;   ESOPHAGOGASTRODUODENOSCOPY (EGD) WITH PROPOFOL N/A 10/17/2022   Procedure: ESOPHAGOGASTRODUODENOSCOPY (EGD) WITH PROPOFOL;  Surgeon: Tressia Danas, MD;  Location: [facility] ENDOSCOPY;  Service: Gastroenterology;   Laterality: N/A;   EYE SURGERY     age 40; "ruptured  right pupil"   EYE SURGERY     age 19   EYE SURGERY  1957   placed artificial right eye   HEMOSTASIS CONTROL  03/24/2022   Procedure: HEMOSTASIS CONTROL;  Surgeon: Tressia Danas, MD;  Location: [facility] ENDOSCOPY;  Service: Gastroenterology;;   HOT HEMOSTASIS N/A 03/24/2022   Procedure: HOT HEMOSTASIS (ARGON PLASMA COAGULATION/BICAP);  Surgeon: Tressia Danas, MD;  Location: [facility] ENDOSCOPY;  Service: Gastroenterology;  Laterality: N/A;   HOT HEMOSTASIS N/A 08/11/2022   Procedure: HOT HEMOSTASIS (ARGON PLASMA COAGULATION/BICAP);  Surgeon: Tressia Danas, MD;  Location: [facility] ENDOSCOPY;  Service: Gastroenterology;  Laterality: N/A;  stomach   HOT HEMOSTASIS N/A 10/17/2022   Procedure: HOT HEMOSTASIS (ARGON PLASMA COAGULATION/BICAP);  Surgeon: Tressia Danas, MD;  Location: [facility] ENDOSCOPY;  Service: Gastroenterology;  Laterality: N/A;   INGUINAL HERNIA REPAIR Right 1983   INGUINAL HERNIA REPAIR Left 1985   IR IVC FILTER PLMT / S&I /IMG GUID/MOD SED  03/29/2022   KNEE ARTHROSCOPY  ~ 06/2005   right   LEFT HEART CATH AND CORONARY ANGIOGRAPHY N/A 03/25/2022   Procedure: LEFT HEART CATH AND CORONARY ANGIOGRAPHY;  Surgeon: Orbie Pyo, MD;  Location: [facility] INVASIVE CV LAB;  Service: Cardiovascular;  Laterality: N/A;   MULTIPLE TOOTH EXTRACTIONS     POLYPECTOMY  08/11/2022   Procedure: POLYPECTOMY;  Surgeon: Tressia Danas, MD;  Location: [facility] ENDOSCOPY;  Service: Gastroenterology;;  colon   spinal injections  2006-2007   "maybe 10 injections for herniated discs"   TEE WITHOUT CARDIOVERSION  03/24/2022   Procedure: TRANSESOPHAGEAL ECHOCARDIOGRAM (TEE);  Surgeon: Tressia Danas, MD;  Location: [facility] ENDOSCOPY;  Service: Gastroenterology;;   TONSILLECTOMY     "when I was real young"   TOTAL KNEE ARTHROPLASTY Left 10/09/2015   Procedure: TOTAL KNEE ARTHROPLASTY;  Surgeon: Jodi Geralds, MD;  Location: [facility] OR;  Service: Orthopedics;  Laterality: Left;    TOTAL KNEE ARTHROPLASTY Right 04/04/2016   Procedure: TOTAL KNEE ARTHROPLASTY;  Surgeon: Jodi Geralds, MD;  Location: [facility] OR;  Service: Orthopedics;  Laterality: Right;    REVIEW OF SYSTEMS:   Review of Systems  Constitutional: Negative for appetite change, chills, fatigue, fever and unexpected weight change.  HENT:   Negative for mouth sores, nosebleeds, sore throat and trouble swallowing.   Eyes: Negative for eye problems and icterus.  Respiratory: Negative for cough, hemoptysis, shortness of breath and wheezing.   Cardiovascular: Negative for chest pain and leg swelling.  Gastrointestinal: Negative for abdominal pain, constipation, diarrhea, nausea and vomiting.  Genitourinary: Negative for bladder incontinence, difficulty urinating, dysuria, frequency and hematuria.   Musculoskeletal: Negative for back pain, gait problem, neck pain and neck stiffness.  Skin: Negative for itching and rash.  Neurological: Negative for dizziness, extremity weakness, gait problem, headaches, light-headedness and seizures.  Hematological: Negative for adenopathy. Does not bruise/bleed easily.  Psychiatric/Behavioral: Negative for confusion, depression and sleep disturbance. The patient is not nervous/anxious.     PHYSICAL EXAMINATION:  There were no vitals taken for this visit.  ECOG PERFORMANCE STATUS: {CHL ONC ECOG [id number]  Physical Exam  Constitutional: Oriented to person, place, and time and well-developed, well-nourished, and in no distress. No distress.  HENT:  Head: Normocephalic and atraumatic.  Mouth/Throat: Oropharynx is clear and moist. No oropharyngeal exudate.  Eyes: Conjunctivae are normal. Right eye exhibits no discharge. Left eye exhibits no discharge. No scleral icterus.  Neck: Normal range of motion. Neck supple.  Cardiovascular: Normal rate, regular rhythm, normal heart sounds and intact distal pulses.   Pulmonary/Chest: Effort normal and breath sounds normal. No respiratory  distress. No wheezes. No rales.  Abdominal: Soft. Bowel sounds are normal. Exhibits no distension and no mass. There is no tenderness.  Musculoskeletal: Normal range of motion. Exhibits no edema.  Lymphadenopathy:    No cervical adenopathy.  Neurological: Alert and oriented to person, place, and time. Exhibits normal muscle tone. Gait normal. Coordination normal.  Skin: Skin is warm and dry. No rash noted. Not diaphoretic. No erythema. No pallor.  Psychiatric: Mood, memory and judgment normal.  Vitals reviewed.  LABORATORY DATA: Lab Results  Component Value Date   WBC 6.1  12/16/2022   HGB 12.7 (L) 12/16/2022   HCT 37.6 (L) 12/16/2022   MCV 88.0 12/16/2022   PLT 234.0 12/16/2022      Chemistry      Component Value Date/Time   NA 140 12/16/2022 1138   NA 139 04/18/2022 1509   K 4.1 12/16/2022 1138   CL 104 12/16/2022 1138   CO2 25 12/16/2022 1138   BUN 27 (H) 12/16/2022 1138   BUN 24 04/18/2022 1509   CREATININE 1.11 12/16/2022 1138   CREATININE 1.24 06/15/2020 1306   CREATININE 1.16 02/10/2020 1532      Component Value Date/Time   CALCIUM 9.2 12/16/2022 1138   ALKPHOS 102 12/16/2022 1138   AST 16 12/16/2022 1138   AST 17 06/15/2020 1306   ALT 17 12/16/2022 1138   ALT 12 06/15/2020 1306   BILITOT 0.4 12/16/2022 1138   BILITOT 0.3 12/17/2020 1017   BILITOT 0.4 06/15/2020 1306       RADIOGRAPHIC STUDIES:  No results found.   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  He follows closely with gastroenterology.  The patient is currently on ferrous sulfate 325 mg p.o. daily.  He also receives IV iron on an as-needed basis his most recent November 2023.  The patient had repeat CBC, iron studies, and ferritin drawn today.  His labs from today show ***.  Recommend***   We will see him for follow-up visit in 6 months for evaluation repeat blood work.  Of course  were happy to see him sooner if there are any concerns in the interval.  The patient was advised to call immediately if she has any concerning symptoms in the interval. The patient voices understanding of current disease status and treatment options and is in agreement with the current care plan. All questions were answered. The patient knows to call the clinic with any problems, questions or concerns. We can certainly see the patient much sooner if necessary   No orders of the defined types were placed in this encounter.    I spent {CHL ONC TIME VISIT - [id number]} counseling the patient face to face. The total time spent in the appointment was {CHL ONC TIME VISIT - [id number]}.  Dela Sweeny L Alek Poncedeleon, PA-C 03/19/23

## 2023-03-20 ENCOUNTER — Other Ambulatory Visit: Payer: Self-pay | Admitting: *Deleted

## 2023-03-20 MED ORDER — FUROSEMIDE 40 MG PO TABS: 40.0000 mg | ORAL_TABLET | Freq: Every day | ORAL | 3 refills | Status: DC

## 2023-03-20 MED ORDER — SPIRONOLACTONE 25 MG PO TABS: 25.0000 mg | ORAL_TABLET | Freq: Every day | ORAL | 3 refills | Status: DC

## 2023-03-20 MED ORDER — HYDRALAZINE HCL 25 MG PO TABS: 25.0000 mg | ORAL_TABLET | Freq: Three times a day (TID) | ORAL | 3 refills | Status: AC

## 2023-03-23 ENCOUNTER — Ambulatory Visit: Payer: Medicare Other | Admitting: Internal Medicine

## 2023-03-23 ENCOUNTER — Other Ambulatory Visit: Payer: Medicare Other

## 2023-03-24 ENCOUNTER — Inpatient Hospital Stay: Payer: Medicare Other

## 2023-03-24 ENCOUNTER — Inpatient Hospital Stay: Payer: Medicare Other | Admitting: Physician Assistant

## 2023-04-03 DIAGNOSIS — L603 Nail dystrophy: Secondary | ICD-10-CM | POA: Diagnosis not present

## 2023-04-03 DIAGNOSIS — I739 Peripheral vascular disease, unspecified: Secondary | ICD-10-CM | POA: Diagnosis not present

## 2023-04-03 DIAGNOSIS — L84 Corns and callosities: Secondary | ICD-10-CM | POA: Diagnosis not present

## 2023-04-12 ENCOUNTER — Other Ambulatory Visit: Payer: Self-pay | Admitting: Internal Medicine

## 2023-04-12 DIAGNOSIS — G8929 Other chronic pain: Secondary | ICD-10-CM

## 2023-04-12 DIAGNOSIS — M170 Bilateral primary osteoarthritis of knee: Secondary | ICD-10-CM

## 2023-05-01 NOTE — Progress Notes (Unsigned)
[facility] OFFICE PROGRESS NOTE  [name], MD [address] [place] [address]  DIAGNOSIS: Microcytic anemia secondary to iron deficiency secondary to gastrointestinal hemorrhage.   PRIOR THERAPY: Iron infusion with Venofer 300 Mg IV weekly on as-needed basis. Most recent dose on 05/17/22  CURRENT THERAPY: Ferrous sulfate 325 mg p.o. daily.   INTERVAL HISTORY: Aaron [name] 77 y.o. male returns to the clinic today for a follow-up visit. The patient is followed for his history of iron deficiency anemia for which he is followed closely by Dr. Orvan Falconer. He has history of GAVE. His most recent EGD was on 10/17/22 which he had some mild gastric antral vascular ectasias without bleeding which was treated with APC.   He is no longer on a blood thinner. He is only on 81 mg aspirin.   He is taking an oral iron supplement daily and is compliant with this. He takes it with vitamin C. He tolerates this well. Today, he is feeling good.  His energy is "doing alright".  Denies any shortness of breath, lightheadedness, or dizziness.  He denies any abnormal bleeding or bruising. He used to have hemorrhoidal bleeding but has not had any in about 1 year. His last iron infusion was about 1 year ago in November 2023. Denies any chest pain.  He is here today for evaluation repeat blood work.  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  MEDICATIONS:  Current Outpatient Medications  Medication Sig Dispense Refill   amLODipine (NORVASC) 5 MG tablet TAKE 1 TABLET BY MOUTH EVERYDAY AT BEDTIME 90 tablet 0   ascorbic acid (C 500/ROSE HIPS) 500 MG tablet Take 500 mg by mouth daily.     aspirin EC 81 MG tablet Take 1 tablet (81 mg total) by mouth daily. Swallow whole. 30 tablet 0   atorvastatin (LIPITOR) 80 MG tablet TAKE 1 TABLET BY MOUTH ONCE  DAILY 100 tablet 0   Calcium Carb-Cholecalciferol (CALCIUM 600 + D PO) Take 1 tablet by mouth daily.     cholecalciferol (VITAMIN D3) 25 MCG (1000 UNIT) tablet Take 1,000 Units by mouth daily.     clonazePAM (KLONOPIN) 0.5 MG tablet TAKE 1 TABLET BY MOUTH EVERYDAY AT BEDTIME 90 tablet 2  ezetimibe (ZETIA) 10 MG tablet TAKE 1 TABLET BY MOUTH DAILY 100 tablet 2   ferrous sulfate 325 (65 FE) MG EC tablet TAKE 1 TABLET BY MOUTH DAILY WITH BREAKFAST. SCHEDULE AN APPOINTMENT FOR FURTHER REFILLS 90 tablet 3   furosemide (LASIX) 40 MG tablet Take 1 tablet (40 mg total) by mouth daily. 90 tablet 3   hydrALAZINE (APRESOLINE) 25 MG tablet Take 1 tablet (25 mg total) by mouth 3 (three) times daily. 270 tablet 3   Multiple Vitamins-Minerals (PRESERVISION AREDS 2) CAPS Take 1 capsule by mouth 2 (two) times daily.     neomycin-bacitracin-polymyxin (NEOSPORIN) OINT Apply 1 Application topically as needed for wound care.     pantoprazole (PROTONIX) 40 MG tablet Take 1 tablet (40 mg total) by mouth daily. 90 tablet 3   polyethylene glycol (MIRALAX / GLYCOLAX) 17 g packet Take 17 g by mouth daily. With coffee     spironolactone  (ALDACTONE) 25 MG tablet Take 1 tablet (25 mg total) by mouth daily. 90 tablet 3   traMADol (ULTRAM) 50 MG tablet TAKE 2 TABLETS (100 MG TOTAL) BY MOUTH EVERY 8 HOURS AS NEEDED FOR MODERATE PAIN 180 tablet 0   traZODone (DESYREL) 150 MG tablet Take 1 tablet (150 mg total) by mouth at bedtime. 90 tablet 1   venlafaxine XR (EFFEXOR-XR) 150 MG 24 hr capsule TAKE 1 CAPSULE BY MOUTH DAILY  WITH BREAKFAST 100 capsule 0   White Petrolatum-Mineral Oil (GENTEAL TEARS NIGHT-TIME) OINT Apply 1 Application to eye at bedtime as needed (dry eyes).     No current facility-administered medications for this visit.    SURGICAL HISTORY:  Past Surgical History:  Procedure Laterality Date   BIOPSY  10/17/2022   Procedure: BIOPSY;  Surgeon: Tressia Danas, MD;  Location: [facility] ENDOSCOPY;  Service: Gastroenterology;;   BUBBLE STUDY  03/24/2022   Procedure: BUBBLE STUDY;  Surgeon: Tressia Danas, MD;  Location: [facility] ENDOSCOPY;  Service: Gastroenterology;;   CARDIAC CATHETERIZATION     carpel tunnel release Bilateral ~ 06/2005/~ 08/2005   right/left   COLONOSCOPY W/ BIOPSIES AND POLYPECTOMY     COLONOSCOPY WITH PROPOFOL N/A 08/11/2022   Procedure: COLONOSCOPY WITH PROPOFOL;  Surgeon: Tressia Danas, MD;  Location: [facility] ENDOSCOPY;  Service: Gastroenterology;  Laterality: N/A;   CORONARY ANGIOPLASTY     CORONARY STENT PLACEMENT  02/13/2004   1   ENUCLEATION Right    age 51   ESOPHAGOGASTRODUODENOSCOPY (EGD) WITH PROPOFOL N/A 03/24/2022   Procedure: ESOPHAGOGASTRODUODENOSCOPY (EGD) WITH PROPOFOL;  Surgeon: Tressia Danas, MD;  Location: [facility] ENDOSCOPY;  Service: Gastroenterology;  Laterality: N/A;   ESOPHAGOGASTRODUODENOSCOPY (EGD) WITH PROPOFOL N/A 08/11/2022   Procedure: ESOPHAGOGASTRODUODENOSCOPY (EGD) WITH PROPOFOL;  Surgeon: Tressia Danas, MD;  Location: [facility] ENDOSCOPY;  Service: Gastroenterology;  Laterality: N/A;   ESOPHAGOGASTRODUODENOSCOPY (EGD) WITH PROPOFOL N/A 10/17/2022   Procedure:  ESOPHAGOGASTRODUODENOSCOPY (EGD) WITH PROPOFOL;  Surgeon: Tressia Danas, MD;  Location: [facility] ENDOSCOPY;  Service: Gastroenterology;  Laterality: N/A;   EYE SURGERY     age 46; "ruptured  right pupil"   EYE SURGERY     age 49   EYE SURGERY  1957   placed artificial right eye   HEMOSTASIS CONTROL  03/24/2022   Procedure: HEMOSTASIS CONTROL;  Surgeon: Tressia Danas, MD;  Location: [facility] ENDOSCOPY;  Service: Gastroenterology;;   HOT HEMOSTASIS N/A 03/24/2022   Procedure: HOT HEMOSTASIS (ARGON PLASMA COAGULATION/BICAP);  Surgeon: Tressia Danas, MD;  Location: [facility] ENDOSCOPY;  Service: Gastroenterology;  Laterality: N/A;   HOT HEMOSTASIS N/A 08/11/2022   Procedure:  HOT HEMOSTASIS (ARGON PLASMA COAGULATION/BICAP);  Surgeon: Tressia Danas, MD;  Location: [facility] ENDOSCOPY;  Service: Gastroenterology;  Laterality: N/A;  stomach   HOT HEMOSTASIS N/A 10/17/2022   Procedure: HOT HEMOSTASIS (ARGON PLASMA COAGULATION/BICAP);  Surgeon: Tressia Danas, MD;  Location: [facility] ENDOSCOPY;  Service: Gastroenterology;  Laterality: N/A;   INGUINAL HERNIA REPAIR Right 1983   INGUINAL HERNIA REPAIR Left 1985   IR IVC FILTER PLMT / S&I /IMG GUID/MOD SED  03/29/2022   KNEE ARTHROSCOPY  ~ 06/2005   right   LEFT HEART CATH AND CORONARY ANGIOGRAPHY N/A 03/25/2022   Procedure: LEFT HEART CATH AND CORONARY ANGIOGRAPHY;  Surgeon: Orbie Pyo, MD;  Location: [facility] INVASIVE CV LAB;  Service: Cardiovascular;  Laterality: N/A;   MULTIPLE TOOTH EXTRACTIONS     POLYPECTOMY  08/11/2022   Procedure: POLYPECTOMY;  Surgeon: Tressia Danas, MD;  Location: [facility] ENDOSCOPY;  Service: Gastroenterology;;  colon   spinal injections  2006-2007   "maybe 10 injections for herniated discs"   TEE WITHOUT CARDIOVERSION  03/24/2022   Procedure: TRANSESOPHAGEAL ECHOCARDIOGRAM (TEE);  Surgeon: Tressia Danas, MD;  Location: [facility] ENDOSCOPY;  Service: Gastroenterology;;   TONSILLECTOMY     "when I was real young"   TOTAL KNEE ARTHROPLASTY Left  10/09/2015   Procedure: TOTAL KNEE ARTHROPLASTY;  Surgeon: Jodi Geralds, MD;  Location: [facility] OR;  Service: Orthopedics;  Laterality: Left;   TOTAL KNEE ARTHROPLASTY Right 04/04/2016   Procedure: TOTAL KNEE ARTHROPLASTY;  Surgeon: Jodi Geralds, MD;  Location: [facility] OR;  Service: Orthopedics;  Laterality: Right;    REVIEW OF SYSTEMS:   Review of Systems  Constitutional: Negative for appetite change, chills, fatigue, fever and unexpected weight change.  HENT: Negative for mouth sores, nosebleeds, sore throat and trouble swallowing.   Eyes: Negative for eye problems and icterus.  Respiratory: Negative for cough, hemoptysis, shortness of breath and wheezing.   Cardiovascular: Negative for chest pain and leg swelling.  Gastrointestinal: Negative for abdominal pain, constipation, diarrhea, nausea and vomiting.  Genitourinary: Negative for bladder incontinence, difficulty urinating, dysuria, frequency and hematuria.   Musculoskeletal: Negative for back pain, gait problem, neck pain and neck stiffness.  Skin: Negative for itching and rash.  Neurological: Negative for dizziness, extremity weakness, gait problem, headaches, light-headedness and seizures.  Hematological: Negative for adenopathy. Does not bruise/bleed easily.  Psychiatric/Behavioral: Negative for confusion, depression and sleep disturbance. The patient is not nervous/anxious.     PHYSICAL EXAMINATION:  Blood pressure (!) 149/62, pulse 74, temperature 98.1 F (36.7 C), temperature source Oral, resp. rate 14, weight 241 lb 4.8 oz (109.5 kg), SpO2 98%.  ECOG PERFORMANCE STATUS: 1  Physical Exam  Constitutional: Oriented to person, place, and time and well-developed, well-nourished, and in no distress. HENT:  Head: Normocephalic and atraumatic.  Mouth/Throat: Oropharynx is clear and moist. No oropharyngeal exudate.  Eyes: Conjunctivae are normal. Right eye exhibits no discharge. Left eye exhibits no discharge. No scleral icterus.  Neck:  Normal range of motion. Neck supple.  Cardiovascular: Normal rate, regular rhythm, normal heart sounds and intact distal pulses.   Pulmonary/Chest: Effort normal and breath sounds normal. No respiratory distress. No wheezes. No rales.  Abdominal: Soft. Bowel sounds are normal. Exhibits no distension and no mass. There is no tenderness.  Musculoskeletal: Scaring over bilateral knees from prior knee replacement. Normal range of motion. Exhibits no edema.  Lymphadenopathy:    No cervical adenopathy.  Neurological: Alert and oriented to person, place, and time. Exhibits normal muscle tone. Gait normal. Coordination normal.  Skin: Skin  is warm and dry. No rash noted. Not diaphoretic. No erythema. No pallor.  Psychiatric: Mood, memory and judgment normal.  Vitals reviewed.  LABORATORY DATA: Lab Results  Component Value Date   WBC 7.5 05/04/2023   HGB 12.1 (L) 05/04/2023   HCT 35.0 (L) 05/04/2023   MCV 89.1 05/04/2023   PLT 212 05/04/2023      Chemistry      Component Value Date/Time   NA 140 12/16/2022 1138   NA 139 04/18/2022 1509   K 4.1 12/16/2022 1138   CL 104 12/16/2022 1138   CO2 25 12/16/2022 1138   BUN 27 (H) 12/16/2022 1138   BUN 24 04/18/2022 1509   CREATININE 1.11 12/16/2022 1138   CREATININE 1.24 06/15/2020 1306   CREATININE 1.16 02/10/2020 1532      Component Value Date/Time   CALCIUM 9.2 12/16/2022 1138   ALKPHOS 102 12/16/2022 1138   AST 16 12/16/2022 1138   AST 17 06/15/2020 1306   ALT 17 12/16/2022 1138   ALT 12 06/15/2020 1306   BILITOT 0.4 12/16/2022 1138   BILITOT 0.3 12/17/2020 1017   BILITOT 0.4 06/15/2020 1306       RADIOGRAPHIC STUDIES:  No results found.   ASSESSMENT/PLAN:  This is a very pleasant 77 year old Caucasian male with a history of iron deficiency anemia secondary to GI blood loss.  He is currently on oral iron supplements with ferrous sulfate 325 mg p.o. daily.  He is tolerating this well.  His most recent IV iron infusion was  on 05/17/22.   Labs were reviewed. His CBC shows mild stable anemia with Hbg 12.1. MCV normal. His iron studies are normal. His ferritin is pending.   Unless his ferritin is very low, I will not arrange any IV iron at this time. Recommend he continue his oral iron supplement daily with vitamin C.   We will see him back in 6 months for evaluation and repeat blood work. I told him I would call him or send mychart message with final recommendation after I had his ferritin back.   He will continue to follow with GI for his history of GAVE  The patient was advised to call immediately if he has any concerning symptoms in the interval. The patient voices understanding of current disease status and treatment options and is in agreement with the current care plan. All questions were answered. The patient knows to call the clinic with any problems, questions or concerns. We can certainly see the patient much sooner if necessary    Orders Placed This Encounter  Procedures   CBC with Differential (Cancer Center Only)    Standing Status:   Future    Standing Expiration Date:   05/03/2024   Ferritin    Standing Status:   Future    Standing Expiration Date:   05/03/2024   Iron and Iron Binding Capacity ([facility] only)    Standing Status:   Future    Standing Expiration Date:   05/03/2024     The total time spent in the appointment was 20-29 minutes  Belvin Gauss L Araina Butrick, PA-C 05/04/23

## 2023-05-04 ENCOUNTER — Inpatient Hospital Stay: Payer: Medicare Other | Admitting: Physician Assistant

## 2023-05-04 ENCOUNTER — Inpatient Hospital Stay: Payer: Medicare Other | Attending: Physician Assistant

## 2023-05-04 ENCOUNTER — Other Ambulatory Visit: Payer: Self-pay

## 2023-05-04 VITALS — BP 149/62 | HR 74 | Temp 98.1°F | Resp 14 | Wt 241.3 lb

## 2023-05-04 DIAGNOSIS — Z7982 Long term (current) use of aspirin: Secondary | ICD-10-CM | POA: Diagnosis not present

## 2023-05-04 DIAGNOSIS — D500 Iron deficiency anemia secondary to blood loss (chronic): Secondary | ICD-10-CM | POA: Diagnosis not present

## 2023-05-04 DIAGNOSIS — D509 Iron deficiency anemia, unspecified: Secondary | ICD-10-CM | POA: Diagnosis not present

## 2023-05-04 DIAGNOSIS — D508 Other iron deficiency anemias: Secondary | ICD-10-CM

## 2023-05-04 DIAGNOSIS — K922 Gastrointestinal hemorrhage, unspecified: Secondary | ICD-10-CM | POA: Insufficient documentation

## 2023-05-04 LAB — IRON AND IRON BINDING CAPACITY (CC-WL,HP ONLY)
Iron: 78 ug/dL (ref 45–182)
Saturation Ratios: 29 % (ref 17.9–39.5)
TIBC: 269 ug/dL (ref 250–450)
UIBC: 191 ug/dL (ref 117–376)

## 2023-05-04 LAB — CBC WITH DIFFERENTIAL (CANCER CENTER ONLY)
Abs Immature Granulocytes: 0.04 10*3/uL (ref 0.00–0.07)
Basophils Absolute: 0.1 10*3/uL (ref 0.0–0.1)
Basophils Relative: 1 %
Eosinophils Absolute: 0.2 10*3/uL (ref 0.0–0.5)
Eosinophils Relative: 3 %
HCT: 35 % — ABNORMAL LOW (ref 39.0–52.0)
Hemoglobin: 12.1 g/dL — ABNORMAL LOW (ref 13.0–17.0)
Immature Granulocytes: 1 %
Lymphocytes Relative: 19 %
Lymphs Abs: 1.5 10*3/uL (ref 0.7–4.0)
MCH: 30.8 pg (ref 26.0–34.0)
MCHC: 34.6 g/dL (ref 30.0–36.0)
MCV: 89.1 fL (ref 80.0–100.0)
Monocytes Absolute: 0.6 10*3/uL (ref 0.1–1.0)
Monocytes Relative: 8 %
Neutro Abs: 5.1 10*3/uL (ref 1.7–7.7)
Neutrophils Relative %: 68 %
Platelet Count: 212 10*3/uL (ref 150–400)
RBC: 3.93 MIL/uL — ABNORMAL LOW (ref 4.22–5.81)
RDW: 12.4 % (ref 11.5–15.5)
WBC Count: 7.5 10*3/uL (ref 4.0–10.5)
nRBC: 0 % (ref 0.0–0.2)

## 2023-05-04 LAB — FERRITIN: Ferritin: 94 ng/mL (ref 24–336)

## 2023-05-08 ENCOUNTER — Telehealth: Payer: Self-pay

## 2023-05-08 NOTE — Telephone Encounter (Signed)
Per Cassie, PA-  Pt's iron looks good.  Will f/u in 6 months.  Schedule message sent.

## 2023-05-13 ENCOUNTER — Telehealth: Payer: Self-pay | Admitting: Internal Medicine

## 2023-05-13 NOTE — Telephone Encounter (Signed)
Scheduled per 10/31 los, patient has been called and notified of upcoming May appointments.

## 2023-05-16 ENCOUNTER — Ambulatory Visit (INDEPENDENT_AMBULATORY_CARE_PROVIDER_SITE_OTHER): Payer: Medicare Other | Admitting: Internal Medicine

## 2023-05-16 ENCOUNTER — Encounter: Payer: Self-pay | Admitting: Internal Medicine

## 2023-05-16 VITALS — BP 122/46 | HR 75 | Temp 98.3°F | Resp 16 | Ht 66.0 in | Wt 240.2 lb

## 2023-05-16 DIAGNOSIS — E781 Pure hyperglyceridemia: Secondary | ICD-10-CM | POA: Diagnosis not present

## 2023-05-16 DIAGNOSIS — Z0000 Encounter for general adult medical examination without abnormal findings: Secondary | ICD-10-CM

## 2023-05-16 DIAGNOSIS — G4700 Insomnia, unspecified: Secondary | ICD-10-CM

## 2023-05-16 DIAGNOSIS — Z0001 Encounter for general adult medical examination with abnormal findings: Secondary | ICD-10-CM

## 2023-05-16 DIAGNOSIS — I10 Essential (primary) hypertension: Secondary | ICD-10-CM

## 2023-05-16 DIAGNOSIS — E785 Hyperlipidemia, unspecified: Secondary | ICD-10-CM | POA: Insufficient documentation

## 2023-05-16 DIAGNOSIS — M170 Bilateral primary osteoarthritis of knee: Secondary | ICD-10-CM | POA: Diagnosis not present

## 2023-05-16 DIAGNOSIS — F411 Generalized anxiety disorder: Secondary | ICD-10-CM

## 2023-05-16 DIAGNOSIS — M545 Low back pain, unspecified: Secondary | ICD-10-CM | POA: Diagnosis not present

## 2023-05-16 DIAGNOSIS — G8929 Other chronic pain: Secondary | ICD-10-CM | POA: Diagnosis not present

## 2023-05-16 DIAGNOSIS — Z23 Encounter for immunization: Secondary | ICD-10-CM | POA: Insufficient documentation

## 2023-05-16 LAB — TSH: TSH: 2.65 u[IU]/mL (ref 0.35–5.50)

## 2023-05-16 MED ORDER — TRAMADOL HCL 50 MG PO TABS: 100.0000 mg | ORAL_TABLET | Freq: Three times a day (TID) | ORAL | 1 refills | Status: DC | PRN

## 2023-05-16 MED ORDER — CLONAZEPAM 0.5 MG PO TABS: 0.5000 mg | ORAL_TABLET | Freq: Two times a day (BID) | ORAL | 1 refills | Status: DC | PRN

## 2023-05-16 MED ORDER — SHINGRIX 50 MCG/0.5ML IM SUSR: 0.5000 mL | Freq: Once | INTRAMUSCULAR | 1 refills | Status: AC

## 2023-05-16 NOTE — Progress Notes (Signed)
Subjective:  Patient ID: Aaron [name], male    DOB: 1946-02-01  Age: 77 y.o. MRN: [id number]  CC: Annual Exam, Anemia, and Hyperlipidemia   HPI Aaron [name] presents for a CPX and f/up ---   Discussed the use of AI scribe software for clinical note transcription with the patient, who gave verbal consent to proceed.  History of Present Illness   The patient, under the care of cardiology and being treated for iron deficiency anemia, reports feeling generally well. He has been taking iron supplements and recent lab work indicates that his iron levels are improving, albeit slowly. He reports feeling a bit fatigued, but attributes this to the physical exertion associated with selling his house and moving. He denies any symptoms of anemia such as weakness, dizziness, or lightheadedness.  The patient has received his flu and COVID vaccinations a few weeks prior. He denies any urinary issues or blood loss.   The patient also reports needing a refill for his tramadol and trazodone medications. He has been taking clonazepam 0.5 mg in the evenings and occasionally in the afternoons due to the stress of selling his house and finding a new place to live. He requests an increase in the quantity of this medication.  The patient has stopped taking his prescribed iron tablets due to insurance coverage issues and has switched to a similar product purchased from [employer]. He also reports some knee pain, which he attributes to his age and previous knee replacements.       Outpatient Medications Prior to Visit  Medication Sig Dispense Refill   ascorbic acid (C 500/ROSE HIPS) 500 MG tablet Take 500 mg by mouth daily.     aspirin EC 81 MG tablet Take 1 tablet (81 mg total) by mouth daily. Swallow whole. 30 tablet 0   atorvastatin (LIPITOR) 80 MG tablet TAKE 1 TABLET BY MOUTH ONCE  DAILY 100 tablet 0   Calcium Carb-Cholecalciferol (CALCIUM 600 + D PO) Take 1 tablet by mouth daily.     cholecalciferol (VITAMIN  D3) 25 MCG (1000 UNIT) tablet Take 1,000 Units by mouth daily.     ezetimibe (ZETIA) 10 MG tablet TAKE 1 TABLET BY MOUTH DAILY 100 tablet 2   ferrous sulfate 325 (65 FE) MG EC tablet TAKE 1 TABLET BY MOUTH DAILY WITH BREAKFAST. SCHEDULE AN APPOINTMENT FOR FURTHER REFILLS 90 tablet 3   furosemide (LASIX) 40 MG tablet Take 1 tablet (40 mg total) by mouth daily. 90 tablet 3   hydrALAZINE (APRESOLINE) 25 MG tablet Take 1 tablet (25 mg total) by mouth 3 (three) times daily. 270 tablet 3   Multiple Vitamins-Minerals (PRESERVISION AREDS 2) CAPS Take 1 capsule by mouth 2 (two) times daily.     neomycin-bacitracin-polymyxin (NEOSPORIN) OINT Apply 1 Application topically as needed for wound care.     pantoprazole (PROTONIX) 40 MG tablet Take 1 tablet (40 mg total) by mouth daily. 90 tablet 3   polyethylene glycol (MIRALAX / GLYCOLAX) 17 g packet Take 17 g by mouth daily. With coffee     spironolactone (ALDACTONE) 25 MG tablet Take 1 tablet (25 mg total) by mouth daily. 90 tablet 3   traZODone (DESYREL) 150 MG tablet Take 1 tablet (150 mg total) by mouth at bedtime. 90 tablet 1   venlafaxine XR (EFFEXOR-XR) 150 MG 24 hr capsule TAKE 1 CAPSULE BY MOUTH DAILY  WITH BREAKFAST 100 capsule 0   White Petrolatum-Mineral Oil (GENTEAL TEARS NIGHT-TIME) OINT Apply 1 Application to eye at bedtime  as needed (dry eyes).     amLODipine (NORVASC) 5 MG tablet TAKE 1 TABLET BY MOUTH EVERYDAY AT BEDTIME 90 tablet 0   clonazePAM (KLONOPIN) 0.5 MG tablet TAKE 1 TABLET BY MOUTH EVERYDAY AT BEDTIME 90 tablet 2   traMADol (ULTRAM) 50 MG tablet TAKE 2 TABLETS (100 MG TOTAL) BY MOUTH EVERY 8 HOURS AS NEEDED FOR MODERATE PAIN 180 tablet 0   No facility-administered medications prior to visit.    ROS Review of Systems  Constitutional:  Negative for appetite change, chills, diaphoresis and fatigue.  HENT: Negative.    Eyes: Negative.   Respiratory: Negative.  Negative for cough, chest tightness, shortness of breath and wheezing.    Cardiovascular:  Negative for chest pain, palpitations and leg swelling.  Gastrointestinal:  Negative for abdominal pain, constipation, diarrhea, nausea and vomiting.  Endocrine: Negative.   Genitourinary: Negative.  Negative for difficulty urinating.  Musculoskeletal:  Positive for arthralgias, back pain and gait problem. Negative for myalgias.  Skin:  Positive for pallor.  Neurological:  Negative for dizziness, weakness, light-headedness and headaches.  Hematological:  Negative for adenopathy. Does not bruise/bleed easily.  Psychiatric/Behavioral:  Positive for confusion, decreased concentration and sleep disturbance. Negative for behavioral problems, dysphoric mood, self-injury and suicidal ideas. The patient is nervous/anxious. The patient is not hyperactive.     Objective:  BP (!) 122/46 (BP Location: Left Arm, Patient Position: Sitting, Cuff Size: Normal)   Pulse 75   Temp 98.3 F (36.8 C) (Oral)   Resp 16   Ht 5\' 6"  (1.676 m)   Wt 240 lb 3.2 oz (109 kg)   SpO2 95%   BMI 38.77 kg/m   BP Readings from Last 3 Encounters:  05/16/23 (!) 122/46  05/04/23 (!) 149/62  02/08/23 106/68    Wt Readings from Last 3 Encounters:  05/16/23 240 lb 3.2 oz (109 kg)  05/04/23 241 lb 4.8 oz (109.5 kg)  02/08/23 238 lb (108 kg)    Physical Exam Vitals reviewed.  Constitutional:      Appearance: He is obese. He is not ill-appearing.  HENT:     Mouth/Throat:     Mouth: Mucous membranes are moist.  Eyes:     General: No scleral icterus.    Conjunctiva/sclera: Conjunctivae normal.  Cardiovascular:     Rate and Rhythm: Normal rate and regular rhythm.     Heart sounds: Murmur heard.     Systolic murmur is present with a grade of 1/6.     No friction rub. No gallop.     Comments: Harsh SEM LUSB Pulmonary:     Effort: Pulmonary effort is normal.     Breath sounds: No stridor. No wheezing, rhonchi or rales.  Abdominal:     General: Abdomen is flat.     Palpations: There is no mass.      Tenderness: There is no abdominal tenderness. There is no guarding.     Hernia: No hernia is present.  Musculoskeletal:     Cervical back: Neck supple.     Right lower leg: No edema.     Left lower leg: No edema.  Lymphadenopathy:     Cervical: No cervical adenopathy.  Skin:    General: Skin is warm and dry.     Coloration: Skin is pale. Skin is not jaundiced.     Findings: No rash.  Neurological:     General: No focal deficit present.     Mental Status: He is alert. Mental status is at baseline.  Psychiatric:        Mood and Affect: Mood normal.        Behavior: Behavior normal.     Lab Results  Component Value Date   WBC 7.5 05/04/2023   HGB 12.1 (L) 05/04/2023   HCT 35.0 (L) 05/04/2023   PLT 212 05/04/2023   GLUCOSE 129 (H) 05/16/2023   CHOL 128 05/16/2023   TRIG 179.0 (H) 05/16/2023   HDL 43.90 05/16/2023   LDLDIRECT 62.0 08/10/2021   LDLCALC 49 05/16/2023   ALT 17 12/16/2022   AST 16 12/16/2022   NA 140 05/16/2023   K 4.3 05/16/2023   CL 105 05/16/2023   CREATININE 1.14 05/16/2023   BUN 22 05/16/2023   CO2 26 05/16/2023   TSH 2.65 05/16/2023   PSA 0.59 02/14/2018   INR 1.11 03/25/2016   HGBA1C 4.3 (L) 03/25/2022    No results found.  Assessment & Plan:  Primary hypertriglyceridemia - Triglycerides have improved. -     Lipid panel; Future  Encounter for general adult medical examination with abnormal findings - Exam completed, labs reviewed, vaccines reviewed and updated, no cancer screenings indicated, pt ed material was given.   Primary hypertension -     Basic metabolic panel; Future -     TSH; Future -     AMB Referral [facility] Care Management  Dyslipidemia, goal LDL below 70 - LDL goal achieved. Doing well on the statin  -     TSH; Future  Need for prophylactic vaccination and inoculation against varicella -     Shingrix; Inject 0.5 mLs into the muscle once for 1 dose.  Dispense: 0.5 mL; Refill: 1  Insomnia, unspecified type -      clonazePAM; Take 1 tablet (0.5 mg total) by mouth 2 (two) times daily as needed for anxiety.  Dispense: 180 tablet; Refill: 1 -     AMB Referral [facility] Care Management  Generalized anxiety disorder -     clonazePAM; Take 1 tablet (0.5 mg total) by mouth 2 (two) times daily as needed for anxiety.  Dispense: 180 tablet; Refill: 1 -     AMB Referral [facility] Care Management  Chronic low back pain, unspecified back pain laterality, unspecified whether sciatica present -     traMADol HCl; Take 2 tablets (100 mg total) by mouth every 8 (eight) hours as needed.  Dispense: 180 tablet; Refill: 1  Primary osteoarthritis of both knees -     traMADol HCl; Take 2 tablets (100 mg total) by mouth every 8 (eight) hours as needed.  Dispense: 180 tablet; Refill: 1     Follow-up: Return in about 6 months (around 11/13/2023).  Sanda Linger, MD

## 2023-05-16 NOTE — Patient Instructions (Signed)
Health Maintenance, Male Adopting a healthy lifestyle and getting preventive care are important in promoting health and wellness. Ask your health care provider about: The right schedule for you to have regular tests and exams. Things you can do on your own to prevent diseases and keep yourself healthy. What should I know about diet, weight, and exercise? Eat a healthy diet  Eat a diet that includes plenty of vegetables, fruits, low-fat dairy products, and lean protein. Do not eat a lot of foods that are high in solid fats, added sugars, or sodium. Maintain a healthy weight Body mass index (BMI) is a measurement that can be used to identify possible weight problems. It estimates body fat based on height and weight. Your health care provider can help determine your BMI and help you achieve or maintain a healthy weight. Get regular exercise Get regular exercise. This is one of the most important things you can do for your health. Most adults should: Exercise for at least 150 minutes each week. The exercise should increase your heart rate and make you sweat (moderate-intensity exercise). Do strengthening exercises at least twice a week. This is in addition to the moderate-intensity exercise. Spend less time sitting. Even light physical activity can be beneficial. Watch cholesterol and blood lipids Have your blood tested for lipids and cholesterol at 77 years of age, then have this test every 5 years. You may need to have your cholesterol levels checked more often if: Your lipid or cholesterol levels are high. You are older than 77 years of age. You are at high risk for heart disease. What should I know about cancer screening? Many types of cancers can be detected early and may often be prevented. Depending on your health history and family history, you may need to have cancer screening at various ages. This may include screening for: Colorectal cancer. Prostate cancer. Skin cancer. Lung  cancer. What should I know about heart disease, diabetes, and high blood pressure? Blood pressure and heart disease High blood pressure causes heart disease and increases the risk of stroke. This is more likely to develop in people who have high blood pressure readings or are overweight. Talk with your health care provider about your target blood pressure readings. Have your blood pressure checked: Every 3-5 years if you are 18-39 years of age. Every year if you are 40 years old or older. If you are between the ages of 65 and 75 and are a current or former smoker, ask your health care provider if you should have a one-time screening for abdominal aortic aneurysm (AAA). Diabetes Have regular diabetes screenings. This checks your fasting blood sugar level. Have the screening done: Once every three years after age 45 if you are at a normal weight and have a low risk for diabetes. More often and at a younger age if you are overweight or have a high risk for diabetes. What should I know about preventing infection? Hepatitis B If you have a higher risk for hepatitis B, you should be screened for this virus. Talk with your health care provider to find out if you are at risk for hepatitis B infection. Hepatitis C Blood testing is recommended for: Everyone born from 1945 through 1965. Anyone with known risk factors for hepatitis C. Sexually transmitted infections (STIs) You should be screened each year for STIs, including gonorrhea and chlamydia, if: You are sexually active and are younger than 77 years of age. You are older than 77 years of age and your   health care provider tells you that you are at risk for this type of infection. Your sexual activity has changed since you were last screened, and you are at increased risk for chlamydia or gonorrhea. Ask your health care provider if you are at risk. Ask your health care provider about whether you are at high risk for HIV. Your health care provider  may recommend a prescription medicine to help prevent HIV infection. If you choose to take medicine to prevent HIV, you should first get tested for HIV. You should then be tested every 3 months for as long as you are taking the medicine. Follow these instructions at home: Alcohol use Do not drink alcohol if your health care provider tells you not to drink. If you drink alcohol: Limit how much you have to 0-2 drinks a day. Know how much alcohol is in your drink. In the [place] one drink equals one 12 oz bottle of beer (355 mL), one 5 oz glass of wine (148 mL), or one 1 oz glass of hard liquor (44 mL). Lifestyle Do not use any products that contain nicotine or tobacco. These products include cigarettes, chewing tobacco, and vaping devices, such as e-cigarettes. If you need help quitting, ask your health care provider. Do not use street drugs. Do not share needles. Ask your health care provider for help if you need support or information about quitting drugs. General instructions Schedule regular health, dental, and eye exams. Stay current with your vaccines. Tell your health care provider if: You often feel depressed. You have ever been abused or do not feel safe at home. Summary Adopting a healthy lifestyle and getting preventive care are important in promoting health and wellness. Follow your health care provider's instructions about healthy diet, exercising, and getting tested or screened for diseases. Follow your health care provider's instructions on monitoring your cholesterol and blood pressure. This information is not intended to replace advice given to you by your health care provider. Make sure you discuss any questions you have with your health care provider. Document Revised: 11/09/2020 Document Reviewed: 11/09/2020 Elsevier Patient Education  2024 [employer]  

## 2023-05-17 LAB — LIPID PANEL
Cholesterol: 128 mg/dL (ref 0–200)
HDL: 43.9 mg/dL (ref >39.00)
LDL Cholesterol: 49 mg/dL (ref 0–99)
NonHDL: 84.3
Total CHOL/HDL Ratio: 3
Triglycerides: 179 mg/dL — ABNORMAL HIGH (ref 0.0–149.0)
VLDL: 35.8 mg/dL (ref 0.0–40.0)

## 2023-05-17 LAB — BASIC METABOLIC PANEL
BUN: 22 mg/dL (ref 6–23)
CO2: 26 meq/L (ref 19–32)
Calcium: 9.1 mg/dL (ref 8.4–10.5)
Chloride: 105 meq/L (ref 96–112)
Creatinine, Ser: 1.14 mg/dL (ref 0.40–1.50)
GFR: 62.06 mL/min (ref >60.00)
Glucose, Bld: 129 mg/dL — ABNORMAL HIGH (ref 70–99)
Potassium: 4.3 meq/L (ref 3.5–5.1)
Sodium: 140 meq/L (ref 135–145)

## 2023-05-22 ENCOUNTER — Telehealth: Payer: Self-pay | Admitting: Internal Medicine

## 2023-05-22 ENCOUNTER — Other Ambulatory Visit: Payer: Self-pay

## 2023-05-22 DIAGNOSIS — F411 Generalized anxiety disorder: Secondary | ICD-10-CM

## 2023-05-22 DIAGNOSIS — G4700 Insomnia, unspecified: Secondary | ICD-10-CM

## 2023-05-22 DIAGNOSIS — F32A Depression, unspecified: Secondary | ICD-10-CM

## 2023-05-22 MED ORDER — TRAZODONE HCL 150 MG PO TABS: 150.0000 mg | ORAL_TABLET | Freq: Every day | ORAL | 1 refills | Status: DC

## 2023-05-22 NOTE — Telephone Encounter (Signed)
 Medication refill sent to Dr. Yetta Barre

## 2023-05-22 NOTE — Telephone Encounter (Signed)
Prescription Request  05/22/2023  LOV: 05/16/2023  What is the name of the medication or equipment?  traZODone (DESYREL) 150 MG tablet  Have you contacted your pharmacy to request a refill? Yes   Which pharmacy would you like this sent to?  [id number] - [place], [place] - [address] AT [facility] [address] [place] [address] Phone: [phone] Fax: [phone]    Patient notified that their request is being sent to the clinical staff for review and that they should receive a response within 2 business days.   Please advise at Mobile [phone] (mobile)

## 2023-05-23 ENCOUNTER — Other Ambulatory Visit: Payer: Self-pay | Admitting: Internal Medicine

## 2023-05-23 DIAGNOSIS — E785 Hyperlipidemia, unspecified: Secondary | ICD-10-CM

## 2023-05-23 DIAGNOSIS — I2510 Atherosclerotic heart disease of native coronary artery without angina pectoris: Secondary | ICD-10-CM

## 2023-05-25 DIAGNOSIS — M545 Low back pain, unspecified: Secondary | ICD-10-CM | POA: Diagnosis not present

## 2023-05-25 DIAGNOSIS — M25552 Pain in left hip: Secondary | ICD-10-CM | POA: Diagnosis not present

## 2023-05-25 DIAGNOSIS — M791 Myalgia, unspecified site: Secondary | ICD-10-CM | POA: Diagnosis not present

## 2023-05-28 ENCOUNTER — Other Ambulatory Visit: Payer: Self-pay | Admitting: Internal Medicine

## 2023-05-29 ENCOUNTER — Telehealth: Payer: Self-pay

## 2023-05-29 NOTE — Progress Notes (Signed)
   Care Guide Note  05/29/2023 Name: Aaron [name] MRN: [id number] DOB: 1946-02-26  Referred by: [name], MD Reason for referral : Care Coordination (Outreach to schedule with Pharm d )   Aaron [name] is a 77 y.o. year old male who is a primary care patient of Weldon, Erlinger, MD. Denton Ar was referred to the pharmacist for assistance related to HTN.    Successful contact was made with the patient to discuss pharmacy services. Patient declines engagement at this time. Contact information was provided to the patient should they wish to reach out for assistance at a later time.  Penne Lash , RMA     [facility]  [facility], [facility] Guide  Direct Dial: [phone]  Website: [email or web address]

## 2023-06-04 ENCOUNTER — Other Ambulatory Visit: Payer: Self-pay | Admitting: Cardiology

## 2023-07-07 ENCOUNTER — Telehealth: Payer: Self-pay | Admitting: Cardiology

## 2023-07-07 NOTE — Telephone Encounter (Signed)
 Pt c/o swelling: STAT is pt has developed SOB within 24 hours  How much weight have you gained and in what time span? No weight gain   If swelling, where is the swelling located? On both legs   Are you currently taking a fluid pill? Yes   Are you currently SOB? No   Do you have a log of your daily weights (if so, list)? N/A  Have you gained 3 pounds in a day or 5 pounds in a week? N/A   Have you traveled recently? No

## 2023-07-07 NOTE — Telephone Encounter (Signed)
 Patient identification verified by 2 forms. Bertina Cooks, RN    Called and spoke to patient  Patient states:   -has swelling in both legs   -swelling started 1 week ago   -swelling has slowly been increasing   -weight today is 233.8lbs   -weight a few days was also 233lbs   -takes lasix  40mg  daily  Patient denies:   -SOB/difficulty breathing   -weight gain   -pain/redness in legs Informed patient message sent for input Patient scheduled for OV 07/12/23 at 9:15am  Patient has no further questions at this time

## 2023-07-09 NOTE — Progress Notes (Deleted)
 Cardiology Clinic Note   Patient Name: Aaron [name] Date of Encounter: 07/09/2023  Primary Care Provider:  Joshua Debby LITTIE, MD Primary Cardiologist:  Peter Jordan, MD  Patient Profile    Aaron [name] 78 year old male presents to the clinic today for follow-up evaluation of his lower extremity swelling.  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   Heart murmur    Hyperlipidemia    Hypertension    Insomnia    Osteoarthritis    PONV (postoperative nausea and vomiting)    2006 - knee surgery   Pre-diabetes    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20   Psoriasis    PTSD (post-traumatic stress disorder)    Pulmonary embolism (HCC) ~ 03/2006   bilaterally   Seasonal allergies    Shortness of breath 06/08/2011   w/exertion; that's why I'm here   Sleep apnea    Sleep apnea, obstructive    Umbilical hernia    Past Surgical History:  Procedure Laterality Date   BIOPSY  10/17/2022   Procedure: BIOPSY;  Surgeon: Eda Iha, MD;  Location: [facility];  Service: Gastroenterology;;   VASSIE STUDY  03/24/2022   Procedure: BUBBLE STUDY;  Surgeon: Eda Iha, MD;  Location: [facility]COPY;  Service: Gastroenterology;;   CARDIAC CATHETERIZATION     carpel tunnel release Bilateral ~ 06/2005/~ 08/2005   right/left   COLONOSCOPY W/ BIOPSIES AND POLYPECTOMY     COLONOSCOPY WITH PROPOFOL  N/A 08/11/2022   Procedure: COLONOSCOPY WITH PROPOFOL ;  Surgeon: Eda Iha, MD;  Location: [facility];  Service: Gastroenterology;  Laterality: N/A;   CORONARY ANGIOPLASTY     CORONARY STENT  PLACEMENT  02/13/2004   1   ENUCLEATION Right    age 50   ESOPHAGOGASTRODUODENOSCOPY (EGD) WITH PROPOFOL  N/A 03/24/2022   Procedure: ESOPHAGOGASTRODUODENOSCOPY (EGD) WITH PROPOFOL ;  Surgeon: Eda Iha, MD;  Location: [facility] ENDOSCOPY;  Service: Gastroenterology;  Laterality: N/A;   ESOPHAGOGASTRODUODENOSCOPY (EGD) WITH PROPOFOL  N/A 08/11/2022   Procedure: ESOPHAGOGASTRODUODENOSCOPY (EGD) WITH PROPOFOL ;  Surgeon: Eda Iha, MD;  Location: [facility];  Service: Gastroenterology;  Laterality: N/A;   ESOPHAGOGASTRODUODENOSCOPY (EGD) WITH PROPOFOL  N/A 10/17/2022   Procedure: ESOPHAGOGASTRODUODENOSCOPY (EGD) WITH PROPOFOL ;  Surgeon: Eda Iha, MD;  Location: [facility];  Service: Gastroenterology;  Laterality: N/A;   EYE SURGERY     age 49; ruptured  right pupil   EYE SURGERY     age 53   EYE SURGERY  1957   placed artificial right eye   HEMOSTASIS CONTROL  03/24/2022   Procedure: HEMOSTASIS CONTROL;  Surgeon: Eda Iha, MD;  Location: [facility] ENDOSCOPY;  Service: Gastroenterology;;   HOT HEMOSTASIS N/A 03/24/2022   Procedure: HOT HEMOSTASIS (ARGON PLASMA COAGULATION/BICAP);  Surgeon: Eda Iha, MD;  Location: [facility] ENDOSCOPY;  Service: Gastroenterology;  Laterality: N/A;   HOT HEMOSTASIS N/A 08/11/2022   Procedure: HOT HEMOSTASIS (ARGON PLASMA COAGULATION/BICAP);  Surgeon: Eda Iha, MD;  Location: [facility];  Service: Gastroenterology;  Laterality: N/A;  stomach   HOT HEMOSTASIS N/A 10/17/2022   Procedure: HOT HEMOSTASIS (ARGON PLASMA COAGULATION/BICAP);  Surgeon: Eda Iha, MD;  Location: [facility];  Service: Gastroenterology;  Laterality: N/A;   INGUINAL HERNIA REPAIR Right 1983   INGUINAL HERNIA REPAIR Left 1985   IR IVC FILTER PLMT / S&I /IMG GUID/MOD SED  03/29/2022   KNEE ARTHROSCOPY  ~ 06/2005   right   LEFT HEART CATH AND CORONARY ANGIOGRAPHY N/A 03/25/2022   Procedure: LEFT HEART CATH AND CORONARY ANGIOGRAPHY;  Surgeon: Wendel Lurena POUR,  MD;  Location: [facility];  Service: Cardiovascular;  Laterality: N/A;   MULTIPLE TOOTH EXTRACTIONS     POLYPECTOMY  08/11/2022   Procedure: POLYPECTOMY;  Surgeon: Eda Iha, MD;  Location: [facility];  Service: Gastroenterology;;  colon   spinal injections  2006-2007   maybe 10 injections for herniated discs   TEE WITHOUT CARDIOVERSION  03/24/2022   Procedure: TRANSESOPHAGEAL ECHOCARDIOGRAM (TEE);  Surgeon: Eda Iha, MD;  Location: [facility] ENDOSCOPY;  Service: Gastroenterology;;   TONSILLECTOMY     when I was real young   TOTAL KNEE ARTHROPLASTY Left 10/09/2015   Procedure: TOTAL KNEE ARTHROPLASTY;  Surgeon: Norleen Gavel, MD;  Location: [facility];  Service: Orthopedics;  Laterality: Left;   TOTAL KNEE ARTHROPLASTY Right 04/04/2016   Procedure: TOTAL KNEE ARTHROPLASTY;  Surgeon: Norleen Gavel, MD;  Location: [facility] OR;  Service: Orthopedics;  Laterality: Right;    Allergies  Allergies  Allergen Reactions   Wellbutrin [Bupropion] Other (See Comments)    Hallucinations, sweating   Amoxicillin Palpitations    History of Present Illness    Aaron [name] has a PMH of HTN, HLD, coronary artery disease, recurrent PE/DVTs on Xarelto , GERD, and obesity.  He underwent PCI with stenting of his mid LAD in 2005.  Follow-up stress testing showed evidence of anterior apical ischemia.  Follow-up Myoview  in 2014 was normal.  He was anticoagulated with Xarelto  for recurrent PE/DVT.  Also has a history of chronic IDA requiring transfusions.  He was hospitalized 9/23 with in-hospital A-fib at rest in the setting of GI bleed.  His hemoglobin was 4.8.  Initial EKG showed ST depressions.  He underwent left heart cath which showed nonobstructive CAD.  His telemetry showed sinus rhythm.  He was suspected to have reversible etiology in the setting of severe anemia.  Due to his risk of rebleed he was discharged with a LifeVest.  He underwent EGD which showed gastric antral vascular ectasia which was  treated with APC.  He was felt to be extremely high risk for rebleed.  GI recommended complete discontinuation of OAC.  His Xarelto  was held and IVC filter was placed by IR.  He was seen in the advanced heart failure clinic and noted to be mildly fluid volume overloaded.  Low-dose Entresto  was added to his medication regimen.  His Jardiance  spironolactone  and furosemide  were continued.  He was seen in follow-up by Dr. Jordan 4/24.  His LifeVest was discontinued due to no further arrhythmias.  His Entresto  and Jardiance  were discontinued.  He remained on amlodipine , furosemide , hydralazine , and spironolactone .  He was seen in follow-up by Mercy Hails PA-C on 02/08/2023.  He was doing well from a cardiac standpoint.  He did note that he could forget his midday dose of hydralazine .  Taking his blood pressure 2 hours before his p.m. medications was discussed.  He denied chest pain, bleeding issues, and cardiac complaints.  He  did note that he was having trouble with finances.  He was making monthly payments to [facility]  His debt was turned over to collection because his monthly payments were not enough.  Social work consult was ordered for further assistance.  He presents to the clinic today for follow-up evaluation and states***.  *** denies chest pain, shortness of breath, lower extremity edema, fatigue, palpitations, melena, hematuria, hemoptysis, diaphoresis, weakness, presyncope, syncope, orthopnea, and PND.  Chronic diastolic CHF-weight today***.  No increased DOE.  Noted to have mild generalized bilateral lower extremity nonpitting edema.  Previously he was taken off of Entresto .  He could not afford Jardiance .  He was instructed to wear lower extremity support stockings. Daily weights-contact office with a weight increase of 2 to 3 pounds overnight or 5 pounds in 1 week Heart healthy low-sodium diet-salty 6 diet sheet given Elevate lower extremities when not active/lower extremity support  stockings Continue furosemide , hydralazine , spironolactone   Coronary artery disease-denies recent episodes of arm neck back or chest discomfort.  Underwent left cardiac catheterization 2023 which showed nonobstructive CAD. Continue current medical therapy Heart healthy low-sodium diet Increase physical activity as tolerated  Hyperlipidemia-LDL***.  Goal less than 70.  Continue atorvastatin , ezetimibe  High-fiber diet  Essential hypertension-BP today***. Maintain blood pressure log Continue hydralazine , spironolactone  Low-sodium diet  History of DVT/PE-denies bleeding issues.  Chronic IDA with history of blood transfusions.  He was previously on Xarelto  which was discontinued by GI due to his chronic IDA.  Underwent IVC filter placement.  History of V-fib arrest-happened during hospital admission.  Episode was felt to be secondary to significant anemia.  He denies palpitations, lightheadedness, presyncope and syncope.  Wore LifeVest for period of time which was discontinued.  Disposition: Follow-up with Dr. Jordan or me in 4-6 months.  Home Medications    Prior to Admission medications   Medication Sig Start Date End Date Taking? Authorizing Provider  ascorbic acid (C 500/ROSE HIPS) 500 MG tablet Take 500 mg by mouth daily.    [provider]  aspirin  EC 81 MG tablet Take 1 tablet (81 mg total) by mouth daily. Swallow whole. 04/04/22   Fairy Frames, MD  atorvastatin  (LIPITOR) 80 MG tablet TAKE 1 TABLET BY MOUTH ONCE  DAILY 05/23/23   Joshua Debby CROME, MD  Calcium  Carb-Cholecalciferol  (CALCIUM  600 + D PO) Take 1 tablet by mouth daily.    [provider]  cholecalciferol  (VITAMIN D3) 25 MCG (1000 UNIT) tablet Take 1,000 Units by mouth daily.    [provider]  clonazePAM  (KLONOPIN ) 0.5 MG tablet Take 1 tablet (0.5 mg total) by mouth 2 (two) times daily as needed for anxiety. 05/16/23   Joshua Debby CROME, MD  ezetimibe  (ZETIA ) 10 MG tablet TAKE 1 TABLET BY MOUTH  DAILY 06/07/23   Jordan, Peter M, MD  ferrous sulfate  325 (65 FE) MG EC tablet TAKE 1 TABLET BY MOUTH DAILY WITH BREAKFAST. SCHEDULE AN APPOINTMENT FOR FURTHER REFILLS 06/28/22   Jordan, Peter M, MD  furosemide  (LASIX ) 40 MG tablet Take 1 tablet (40 mg total) by mouth daily. 03/20/23   Jordan, Peter M, MD  hydrALAZINE  (APRESOLINE ) 25 MG tablet Take 1 tablet (25 mg total) by mouth 3 (three) times daily. 03/20/23   Jordan, Peter M, MD  Multiple Vitamins-Minerals (PRESERVISION AREDS 2) CAPS Take 1 capsule by mouth 2 (two) times daily.    [provider]  neomycin-bacitracin-polymyxin (NEOSPORIN) OINT Apply 1 Application topically as needed for wound care.    [provider]  pantoprazole  (PROTONIX ) 40 MG tablet Take 1 tablet (40 mg total) by mouth daily. 12/16/22   Craig Alan SAUNDERS, PA-C  polyethylene glycol (MIRALAX  / GLYCOLAX ) 17 g packet Take 17 g by mouth daily. With coffee    [provider]  spironolactone  (ALDACTONE ) 25 MG tablet Take 1 tablet (25 mg total) by mouth daily. 03/20/23   Jordan, Peter M, MD  traMADol  (ULTRAM ) 50 MG tablet Take 2 tablets (100 mg total) by mouth every 8 (eight) hours as needed. 05/16/23   Joshua Debby CROME, MD  traZODone  (DESYREL ) 150 MG tablet Take 1 tablet (150 mg total) by mouth at bedtime. 05/22/23   Joshua Debby CROME, MD  venlafaxine  XR (EFFEXOR -XR) 150 MG 24 hr capsule TAKE 1 CAPSULE BY MOUTH DAILY  WITH BREAKFAST 01/04/23   Joshua Debby CROME, MD  White Petrolatum-Mineral Oil (GENTEAL TEARS NIGHT-TIME) OINT Apply 1 Application to eye at bedtime as needed (dry eyes).    [provider]    Family History    Family History  Problem Relation Age of Onset   Asthma Father    Emphysema Father    Breast cancer Sister    Liver cancer Sister    Asthma Sister    Asthma Brother    Colon cancer Neg Hx    Esophageal cancer Neg Hx    Colon polyps Neg Hx    Rectal cancer Neg Hx    Stomach cancer Neg Hx    He indicated that his mother is  deceased. He indicated that his father is deceased. He indicated that one of his four sisters is deceased. He indicated that only one of his three brothers is alive. He indicated that the status of his neg hx is unknown.  Social History    Social History   Socioeconomic History   Marital status: Married    Spouse name: Not on file   Number of children: 3   Years of education: Not on file   Highest education level: 10th grade  Occupational History   Occupation: building maintenance    Comment: disabled  Tobacco Use   Smoking status: Former    Current packs/day: 0.00    Average packs/day: 1.5 packs/day for 25.0 years (37.5 ttl pk-yrs)    Types: Cigarettes    Start date: 07/09/1970    Quit date: 07/10/1995    Years since quitting: 28.0   Smokeless tobacco: Never  Vaping Use   Vaping status: Never Used  Substance and Sexual Activity   Alcohol use: No   Drug use: No   Sexual activity: Not on file  Other Topics Concern   Not on file  Social History Narrative   Former education administrator, 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   Recent stress, currently undergoing financial difficulties.   Social Drivers of Corporate Investment Banker Strain: Low Risk  (05/12/2023)   Overall Financial Resource Strain (CARDIA)    Difficulty of Paying Living Expenses: Not very hard  Food Insecurity: No Food Insecurity (05/12/2023)   Hunger Vital Sign    Worried About Running Out of Food in the Last Year: Never true    Ran Out of Food in the Last Year: Never true  Transportation Needs: No Transportation Needs (05/12/2023)   PRAPARE - Administrator, Civil Service (Medical): No    Lack of Transportation (Non-Medical): No  Physical Activity: Insufficiently Active (05/12/2023)   Exercise Vital Sign    Days of Exercise per Week: 2 days  Minutes of Exercise per Session: 20 min  Stress: No Stress Concern Present (05/12/2023)   [employer] of Occupational Health - Occupational Stress  Questionnaire    Feeling of Stress : Only a little  Social Connections: Socially Isolated (05/12/2023)   Social Connection and Isolation Panel [NHANES]    Frequency of Communication with Friends and Family: Twice a week    Frequency of Social Gatherings with Friends and Family: Never    Attends Religious Services: Never    Database Administrator or Organizations: No    Attends Banker Meetings: Never    Marital Status: Married  Catering Manager Violence: Not At Risk (12/05/2022)   Humiliation, Afraid, Rape, and Kick questionnaire    Fear of Current or Ex-Partner: No    Emotionally Abused: No    Physically Abused: No    Sexually Abused: No     Review of Systems    General:  No chills, fever, night sweats or weight changes.  Cardiovascular:  No chest pain, dyspnea on exertion, edema, orthopnea, palpitations, paroxysmal nocturnal dyspnea. Dermatological: No rash, lesions/masses Respiratory: No cough, dyspnea Urologic: No hematuria, dysuria Abdominal:   No nausea, vomiting, diarrhea, bright red blood per rectum, melena, or hematemesis Neurologic:  No visual changes, wkns, changes in mental status. All other systems reviewed and are otherwise negative except as noted above.  Physical Exam    VS:  There were no vitals taken for this visit. , BMI There is no height or weight on file to calculate BMI. GEN: Well nourished, well developed, in no acute distress. HEENT: normal. Neck: Supple, no JVD, carotid bruits, or masses. Cardiac: RRR, no murmurs, rubs, or gallops. No clubbing, cyanosis, edema.  Radials/DP/PT 2+ and equal bilaterally.  Respiratory:  Respirations regular and unlabored, clear to auscultation bilaterally. GI: Soft, nontender, nondistended, BS + x 4. MS: no deformity or atrophy. Skin: warm and dry, no rash. Neuro:  Strength and sensation are intact. Psych: Normal affect.  Accessory Clinical Findings    Recent Labs: 12/16/2022: ALT 17 05/04/2023: Hemoglobin  12.1; Platelet Count 212 05/16/2023: BUN 22; Creatinine, Ser 1.14; Potassium 4.3; Sodium 140; TSH 2.65   Recent Lipid Panel    Component Value Date/Time   CHOL 128 05/16/2023 1452   CHOL 151 12/17/2020 1017   TRIG 179.0 (H) 05/16/2023 1452   HDL 43.90 05/16/2023 1452   HDL 40 12/17/2020 1017   CHOLHDL 3 05/16/2023 1452   VLDL 35.8 05/16/2023 1452   LDLCALC 49 05/16/2023 1452   LDLCALC 89 12/17/2020 1017   LDLDIRECT 62.0 08/10/2021 1520    No BP recorded.  {Refresh Note OR Click here to enter BP  :1}***    ECG personally reviewed by me today- ***     Echocardiogram 03/26/2022   IMPRESSIONS     1. Study not well visualized to full assess endocardial borders. Left  ventricular ejection fraction, by estimation, is 55 to 60%. The left  ventricle has normal function. The left ventricle has no regional wall  motion abnormalities. There is mild left  ventricular hypertrophy. Left ventricular diastolic parameters were  normal.   2. Right ventricular systolic function is normal. The right ventricular  size is normal. There is normal pulmonary artery systolic pressure.   3. The mitral valve is normal in structure. No evidence of mitral valve  regurgitation.   4. The aortic valve was not well visualized. Aortic valve regurgitation  is not visualized.   5. The inferior vena cava is normal  in size with greater than 50%  respiratory variability, suggesting right atrial pressure of 3 mmHg.   FINDINGS   Left Ventricle: Study not well visualized to full assess endocardial  borders. Left ventricular ejection fraction, by estimation, is 55 to 60%.  The left ventricle has normal function. The left ventricle has no regional  wall motion abnormalities. The left   ventricular internal cavity size was normal in size. There is mild left  ventricular hypertrophy. Left ventricular diastolic parameters were  normal.   Right Ventricle: The right ventricular size is normal. No increase in   right ventricular wall thickness. Right ventricular systolic function is  normal. There is normal pulmonary artery systolic pressure. The tricuspid  regurgitant velocity is 2.34 m/s, and   with an assumed right atrial pressure of 3 mmHg, the estimated right  ventricular systolic pressure is 24.9 mmHg.   Left Atrium: Left atrial size was normal in size.   Right Atrium: Right atrial size was normal in size.   Pericardium: There is no evidence of pericardial effusion.   Mitral Valve: The mitral valve is normal in structure. No evidence of  mitral valve regurgitation.   Tricuspid Valve: The tricuspid valve is normal in structure. Tricuspid  valve regurgitation is not demonstrated.   Aortic Valve: The aortic valve was not well visualized. Aortic valve  regurgitation is not visualized. Aortic valve mean gradient measures 12.0  mmHg. Aortic valve peak gradient measures 24.4 mmHg. Aortic valve area, by  VTI measures 1.76 cm.   Pulmonic Valve: The pulmonic valve was not well visualized. Pulmonic valve  regurgitation is not visualized.   Aorta: The aortic root and ascending aorta are structurally normal, with  no evidence of dilitation.   Venous: The inferior vena cava is normal in size with greater than 50%  respiratory variability, suggesting right atrial pressure of 3 mmHg.   IAS/Shunts: The interatrial septum was not well visualized.      Assessment & Plan   1.  ***   Josefa HERO. Yamina Lenis NP-C     07/09/2023, 1:19 PM [facility] [address] Office [phone] Fax [phone]    I spent***minutes examining this patient, reviewing medications, and using patient centered shared decision making involving her cardiac care.   I spent greater than 20 minutes reviewing her past medical history,  medications, and prior cardiac tests.

## 2023-07-10 NOTE — Telephone Encounter (Signed)
 [place], Peter M, MD  Fawn Kirk hours ago (11:02 AM)    He can wait until evaluated at [facility]  Peter [place] MD, [facility]    Noted  No further nursing outreach needed at this time

## 2023-07-12 ENCOUNTER — Ambulatory Visit: Payer: Medicare Other | Admitting: General Practice

## 2023-07-17 NOTE — Progress Notes (Signed)
Cardiology Office Note:    Date:  07/20/2023   ID:  Aaron [name], DOB 05/30/1946, MRN [id number]  PCP:  [name], MD   [facility] Providers Cardiologist:  Peter Swaziland, MD Cardiology APP:  Marcelino Duster, [place]     Referring MD: [name], MD   Chief Complaint  Patient presents with   Follow-up    swelling  Lower extremity swelling  History of Present Illness:    Aaron [name] is a 79 y.o. male with a hx of hypertension, coronary artery disease, recurrent PE/DVTs on xarelto, GERD, hyperlipidemia, and obesity.  He has a history of stenting to his mid LAD in 2005 following a nuclear stress test in that showed evidence of anterior apical ischemia.  Follow-up Myoview in 2014 was normal.  He was anticoagulated on Xarelto for recurrent PE/DVT, but with a history of chronic IDA requiring transfusions.  Hypertension was managed by his PCP. He was hospitalized 03/2022 with an in-hospital Vfib arrest in the setting of GI bleed and Hb of 4.8. Initial EKG with ST depressions. LHC revealed nonobstructive CAD, no intervention. Telemetry with sinus rhythm. Suspected a reversible etiology in the setting of severe anemia. However, due to risk of re-bleed, he was discharged with a lifevest.  GI workup notable for EGD showing gastric antral vascular ectasia treated with APC, felt to be extremely high risk for re-bleed. GI recommended complete discontinuation of OAC. Therefore, Xarelto held and IVC filter was placed by IR.   He was seen in [facility] clinic with mild volume overload. Low dose entresto was added along with continuation of jardiance, spiro, and lasix 40 mg. Last seen by Dr. Swaziland 10/2022. I discontinued his lifevest as there were no arrhythmias noted. Vfib arrest felt secondary to significant anemia. He as admitted 10/17/2022 with GAVE treated with APC.   Now off entresto and jardiance Currently: amlodipine, lasix, hydralazine, spironolactone  He presents for routine  follow up.   Now off entresto and jardiance Currently: amlodipine, lasix, hydralazine, spironolactone  I last saw him in August 2024 and he was doing well, but forgetting afternoon dose of hydralazine. He was having social challenges at the time.   He called our office reporting weight gain and LE swelling and was added to my schedule. He moved to an apartment Nov 20th - ish. He noticed increased leg swelling at the beginning of the year. Swelling is better in the morning and progressively worsens througout the day. Of note, he also moved to an apartment with concrete floors and is dealing with increased back pain. The new mattress is also too soft for him.  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"   Heart murmur    Hyperlipidemia    Hypertension    Insomnia    Osteoarthritis    PONV (postoperative nausea and vomiting)    2006 - knee surgery   Pre-diabetes    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73   Psoriasis    PTSD (post-traumatic stress disorder)    Pulmonary embolism (HCC) ~ 03/2006   bilaterally   Seasonal allergies    Shortness of breath 06/08/2011   "w/exertion; that's why I'm here"   Sleep apnea    Sleep apnea, obstructive    Umbilical hernia     Past Surgical History:  Procedure Laterality Date   BIOPSY  10/17/2022   Procedure: BIOPSY;  Surgeon: Tressia Danas, MD;  Location: [facility] ENDOSCOPY;  Service: Gastroenterology;;   Thressa Sheller STUDY  03/24/2022   Procedure: BUBBLE STUDY;  Surgeon: Tressia Danas, MD;  Location: [facility] ENDOSCOPY;  Service: Gastroenterology;;   CARDIAC CATHETERIZATION     carpel tunnel  release Bilateral ~ 06/2005/~ 08/2005   right/left   COLONOSCOPY W/ BIOPSIES AND POLYPECTOMY     COLONOSCOPY WITH PROPOFOL N/A 08/11/2022   Procedure: COLONOSCOPY WITH PROPOFOL;  Surgeon: Tressia Danas, MD;  Location: [facility] ENDOSCOPY;  Service: Gastroenterology;  Laterality: N/A;   CORONARY ANGIOPLASTY     CORONARY STENT PLACEMENT  02/13/2004   1   ENUCLEATION Right    age 69   ESOPHAGOGASTRODUODENOSCOPY (EGD) WITH PROPOFOL N/A 03/24/2022   Procedure: ESOPHAGOGASTRODUODENOSCOPY (EGD) WITH PROPOFOL;  Surgeon: Tressia Danas, MD;  Location: [facility] ENDOSCOPY;  Service: Gastroenterology;  Laterality: N/A;   ESOPHAGOGASTRODUODENOSCOPY (EGD) WITH PROPOFOL N/A 08/11/2022   Procedure: ESOPHAGOGASTRODUODENOSCOPY (EGD) WITH PROPOFOL;  Surgeon: Tressia Danas, MD;  Location: [facility] ENDOSCOPY;  Service: Gastroenterology;  Laterality: N/A;   ESOPHAGOGASTRODUODENOSCOPY (EGD) WITH PROPOFOL N/A 10/17/2022   Procedure: ESOPHAGOGASTRODUODENOSCOPY (EGD) WITH PROPOFOL;  Surgeon: Tressia Danas, MD;  Location: [facility] ENDOSCOPY;  Service: Gastroenterology;  Laterality: N/A;   EYE SURGERY     age 63; "ruptured  right pupil"   EYE SURGERY     age 54   EYE SURGERY  1957   placed artificial right eye   HEMOSTASIS CONTROL  03/24/2022   Procedure: HEMOSTASIS CONTROL;  Surgeon: Tressia Danas, MD;  Location: [facility] ENDOSCOPY;  Service: Gastroenterology;;   HOT HEMOSTASIS N/A 03/24/2022   Procedure: HOT HEMOSTASIS (ARGON PLASMA COAGULATION/BICAP);  Surgeon: Tressia Danas, MD;  Location: [facility] ENDOSCOPY;  Service: Gastroenterology;  Laterality: N/A;   HOT HEMOSTASIS N/A 08/11/2022   Procedure: HOT HEMOSTASIS (ARGON PLASMA COAGULATION/BICAP);  Surgeon: Tressia Danas, MD;  Location: [facility] ENDOSCOPY;  Service: Gastroenterology;  Laterality: N/A;  stomach   HOT HEMOSTASIS N/A 10/17/2022   Procedure: HOT HEMOSTASIS (ARGON PLASMA COAGULATION/BICAP);  Surgeon: Tressia Danas, MD;  Location: [facility] ENDOSCOPY;  Service: Gastroenterology;   Laterality: N/A;   INGUINAL HERNIA REPAIR Right 1983   INGUINAL HERNIA REPAIR Left 1985   IR IVC FILTER PLMT / S&I /IMG GUID/MOD SED  03/29/2022   KNEE ARTHROSCOPY  ~ 06/2005   right   LEFT HEART CATH AND CORONARY ANGIOGRAPHY N/A 03/25/2022   Procedure: LEFT HEART CATH AND CORONARY ANGIOGRAPHY;  Surgeon: Orbie Pyo, MD;  Location: [facility] INVASIVE CV LAB;  Service: Cardiovascular;  Laterality: N/A;   MULTIPLE TOOTH EXTRACTIONS     POLYPECTOMY  08/11/2022   Procedure: POLYPECTOMY;  Surgeon: Tressia Danas, MD;  Location: [facility] ENDOSCOPY;  Service: Gastroenterology;;  colon   spinal injections  2006-2007   "maybe 10 injections for herniated discs"   TEE WITHOUT CARDIOVERSION  03/24/2022   Procedure: TRANSESOPHAGEAL ECHOCARDIOGRAM (TEE);  Surgeon: Tressia Danas, MD;  Location: [facility] ENDOSCOPY;  Service: Gastroenterology;;   TONSILLECTOMY     "when I was real young"   TOTAL KNEE ARTHROPLASTY Left 10/09/2015   Procedure: TOTAL  KNEE ARTHROPLASTY;  Surgeon: Jodi Geralds, MD;  Location: [facility] OR;  Service: Orthopedics;  Laterality: Left;   TOTAL KNEE ARTHROPLASTY Right 04/04/2016   Procedure: TOTAL KNEE ARTHROPLASTY;  Surgeon: Jodi Geralds, MD;  Location: [facility] OR;  Service: Orthopedics;  Laterality: Right;    Current Medications: Current Meds  Medication Sig   ascorbic acid (C 500/ROSE HIPS) 500 MG tablet Take 500 mg by mouth daily.   aspirin EC 81 MG tablet Take 1 tablet (81 mg total) by mouth daily. Swallow whole.   Calcium Carb-Cholecalciferol (CALCIUM 600 + D PO) Take 1 tablet by mouth daily.   cholecalciferol (VITAMIN D3) 25 MCG (1000 UNIT) tablet Take 1,000 Units by mouth daily.   clonazePAM (KLONOPIN) 0.5 MG tablet Take 1 tablet (0.5 mg total) by mouth 2 (two) times daily as needed for anxiety.   ferrous sulfate 325 (65 FE) MG EC tablet TAKE 1 TABLET BY MOUTH DAILY WITH BREAKFAST. SCHEDULE AN APPOINTMENT FOR FURTHER REFILLS   hydrALAZINE (APRESOLINE) 25 MG tablet Take 1 tablet (25 mg total) by  mouth 3 (three) times daily.   Multiple Vitamins-Minerals (PRESERVISION AREDS 2) CAPS Take 1 capsule by mouth daily.   neomycin-bacitracin-polymyxin (NEOSPORIN) OINT Apply 1 Application topically as needed for wound care.   pantoprazole (PROTONIX) 40 MG tablet Take 1 tablet (40 mg total) by mouth daily.   polyethylene glycol (MIRALAX / GLYCOLAX) 17 g packet Take 17 g by mouth daily. With coffee   traMADol (ULTRAM) 50 MG tablet Take 2 tablets (100 mg total) by mouth every 8 (eight) hours as needed.   traZODone (DESYREL) 150 MG tablet Take 1 tablet (150 mg total) by mouth at bedtime.   venlafaxine XR (EFFEXOR-XR) 150 MG 24 hr capsule TAKE 1 CAPSULE BY MOUTH DAILY  WITH BREAKFAST   White Petrolatum-Mineral Oil (GENTEAL TEARS NIGHT-TIME) OINT Apply 1 Application to eye at bedtime as needed (dry eyes).   [DISCONTINUED] atorvastatin (LIPITOR) 80 MG tablet TAKE 1 TABLET BY MOUTH ONCE  DAILY   [DISCONTINUED] dapagliflozin propanediol (FARXIGA) 10 MG TABS tablet Take 1 tablet (10 mg total) by mouth daily before breakfast.   [DISCONTINUED] ezetimibe (ZETIA) 10 MG tablet TAKE 1 TABLET BY MOUTH DAILY   [DISCONTINUED] furosemide (LASIX) 40 MG tablet Take 1 tablet (40 mg total) by mouth daily.   [DISCONTINUED] spironolactone (ALDACTONE) 25 MG tablet Take 1 tablet (25 mg total) by mouth daily.     Allergies:   Wellbutrin [bupropion] and Amoxicillin   Social History   Socioeconomic History   Marital status: Married    Spouse name: Not on file   Number of children: 3   Years of education: Not on file   Highest education level: 10th grade  Occupational History   Occupation: building maintenance    Comment: disabled  Tobacco Use   Smoking status: Former    Current packs/day: 0.00    Average packs/day: 1.5 packs/day for 25.0 years (37.5 ttl pk-yrs)    Types: Cigarettes    Start date: 07/09/1970    Quit date: 07/10/1995    Years since quitting: 28.0   Smokeless tobacco: Never  Vaping Use   Vaping  status: Never Used  Substance and Sexual Activity   Alcohol use: No   Drug use: No   Sexual activity: Not on file  Other Topics Concern   Not on file  Social History Narrative   Former Education administrator, 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   Recent stress, currently undergoing financial difficulties.  Social Drivers of Corporate investment banker Strain: Low Risk  (05/12/2023)   Overall Financial Resource Strain (CARDIA)    Difficulty of Paying Living Expenses: Not very hard  Food Insecurity: No Food Insecurity (05/12/2023)   Hunger Vital Sign    Worried About Running Out of Food in the Last Year: Never true    Ran Out of Food in the Last Year: Never true  Transportation Needs: No Transportation Needs (05/12/2023)   PRAPARE - Administrator, Civil Service (Medical): No    Lack of Transportation (Non-Medical): No  Physical Activity: Insufficiently Active (05/12/2023)   Exercise Vital Sign    Days of Exercise per Week: 2 days    Minutes of Exercise per Session: 20 min  Stress: No Stress Concern Present (05/12/2023)   [employer] of Occupational Health - Occupational Stress Questionnaire    Feeling of Stress : Only a little  Social Connections: Socially Isolated (05/12/2023)   Social Connection and Isolation Panel [NHANES]    Frequency of Communication with Friends and Family: Twice a week    Frequency of Social Gatherings with Friends and Family: Never    Attends Religious Services: Never    Diplomatic Services operational officer: No    Attends Engineer, structural: Never    Marital Status: Married     Family History: The patient's family history includes Asthma in his brother, father, and sister; Breast cancer in his sister; Emphysema in his father; Liver cancer in his sister. There is no history of Colon cancer, Esophageal cancer, Colon polyps, Rectal cancer, or Stomach cancer.  ROS:   Please see the history of present illness.     All other systems  reviewed and are negative.  EKGs/Labs/Other Studies Reviewed:    The following studies were reviewed today:  Cardiac Studies & Procedures   CARDIAC CATHETERIZATION  CARDIAC CATHETERIZATION 03/25/2022  Narrative   Dist LAD lesion is 10% stenosed.   1st Mrg lesion is 30% stenosed.  1.  Diffuse mild to moderate obstructive disease with patent mid LAD stent. 2.  Elevated LVEDP of 25 mmHg.  Commendation: Continued medical therapy.  Findings Coronary Findings Diagnostic  Dominance: Co-dominant  Left Anterior Descending There is mild diffuse disease throughout the vessel. Dist LAD lesion is 10% stenosed. The lesion was previously treated .  Left Circumflex There is mild diffuse disease throughout the vessel.  First Obtuse Marginal Branch 1st Mrg lesion is 30% stenosed.  Third Left Posterolateral Branch There is mild disease in the vessel.  Left Posterior Atrioventricular Artery There is mild disease in the vessel.  Right Coronary Artery Vessel is moderate in size. There is mild diffuse disease throughout the vessel.  Intervention  No interventions have been documented.   STRESS TESTS  MYOCARDIAL PERFUSION IMAGING 05/22/2020  Narrative  The left ventricular ejection fraction is normal (55-65%).  Nuclear stress EF: 56%.  There was no ST segment deviation noted during stress.  No T wave inversion was noted during stress.  The study is normal.  This is a low risk study.  Low risk stress nuclear study with normal perfusion and normal left ventricular regional and global systolic function.  ECHOCARDIOGRAM  ECHOCARDIOGRAM COMPLETE 03/26/2022  Narrative ECHOCARDIOGRAM REPORT    Patient Name:   AIREN MOTTE Date of Exam: 03/26/2022 Medical Rec #:  [id number]      Height:       66.0 in Accession #:    [id number]  Weight:       205.1 lb Date of Birth:  1945-12-11      BSA:          2.021 m Patient Age:    76 years       BP:           139/55 mmHg Patient  Gender: M              HR:           79 bpm. Exam Location:  Inpatient  Procedure: 2D Echo, Cardiac Doppler and Color Doppler  Indications:    Cardiac arrest  History:        Patient has prior history of Echocardiogram examinations, most recent 03/24/2022. CAD, PE, Arrythmias:Ventricular Fibrillation and Cardiac Arrest; Risk Factors:Hypertension, Sleep Apnea and Former Smoker.  Sonographer:    Aron Baba Referring Phys: [id number] Little Ishikawa   Sonographer Comments: Image acquisition challenging due to patient body habitus and Image acquisition challenging due to respiratory motion. Patient was in too much pain from resucitation performed night before to [facility] well. IMPRESSIONS   1. Study not well visualized to full assess endocardial borders. Left ventricular ejection fraction, by estimation, is 55 to 60%. The left ventricle has normal function. The left ventricle has no regional wall motion abnormalities. There is mild left ventricular hypertrophy. Left ventricular diastolic parameters were normal. 2. Right ventricular systolic function is normal. The right ventricular size is normal. There is normal pulmonary artery systolic pressure. 3. The mitral valve is normal in structure. No evidence of mitral valve regurgitation. 4. The aortic valve was not well visualized. Aortic valve regurgitation is not visualized. 5. The inferior vena cava is normal in size with greater than 50% respiratory variability, suggesting right atrial pressure of 3 mmHg.  FINDINGS Left Ventricle: Study not well visualized to full assess endocardial borders. Left ventricular ejection fraction, by estimation, is 55 to 60%. The left ventricle has normal function. The left ventricle has no regional wall motion abnormalities. The left ventricular internal cavity size was normal in size. There is mild left ventricular hypertrophy. Left ventricular diastolic parameters were normal.  Right Ventricle: The  right ventricular size is normal. No increase in right ventricular wall thickness. Right ventricular systolic function is normal. There is normal pulmonary artery systolic pressure. The tricuspid regurgitant velocity is 2.34 m/s, and with an assumed right atrial pressure of 3 mmHg, the estimated right ventricular systolic pressure is 24.9 mmHg.  Left Atrium: Left atrial size was normal in size.  Right Atrium: Right atrial size was normal in size.  Pericardium: There is no evidence of pericardial effusion.  Mitral Valve: The mitral valve is normal in structure. No evidence of mitral valve regurgitation.  Tricuspid Valve: The tricuspid valve is normal in structure. Tricuspid valve regurgitation is not demonstrated.  Aortic Valve: The aortic valve was not well visualized. Aortic valve regurgitation is not visualized. Aortic valve mean gradient measures 12.0 mmHg. Aortic valve peak gradient measures 24.4 mmHg. Aortic valve area, by VTI measures 1.76 cm.  Pulmonic Valve: The pulmonic valve was not well visualized. Pulmonic valve regurgitation is not visualized.  Aorta: The aortic root and ascending aorta are structurally normal, with no evidence of dilitation.  Venous: The inferior vena cava is normal in size with greater than 50% respiratory variability, suggesting right atrial pressure of 3 mmHg.  IAS/Shunts: The interatrial septum was not well visualized.   LEFT VENTRICLE PLAX 2D LVIDd:  5.60 cm      Diastology LVIDs:         4.15 cm      LV e' medial:  8.70 cm/s LV PW:         1.30 cm      LV e' lateral: 14.10 cm/s LV IVS:        1.00 cm LVOT diam:     2.10 cm LV SV:         83 LV SV Index:   41 LVOT Area:     3.46 cm  LV Volumes (MOD) LV vol d, MOD A2C: 162.0 ml LV vol d, MOD A4C: 182.0 ml LV vol s, MOD A2C: 69.8 ml LV vol s, MOD A4C: 79.8 ml LV SV MOD A2C:     92.2 ml LV SV MOD A4C:     182.0 ml LV SV MOD BP:      99.5 ml  RIGHT VENTRICLE RV S prime:     15.70  cm/s TAPSE (M-mode): 2.8 cm  LEFT ATRIUM            Index        RIGHT ATRIUM           Index LA diam:      4.00 cm  1.98 cm/m   RA Area:     17.50 cm LA Vol (A2C): 57.0 ml  28.20 ml/m  RA Volume:   45.30 ml  22.41 ml/m LA Vol (A4C): 105.0 ml 51.95 ml/m AORTIC VALVE AV Area (Vmax):    1.88 cm AV Area (Vmean):   1.89 cm AV Area (VTI):     1.76 cm AV Vmax:           247.00 cm/s AV Vmean:          160.000 cm/s AV VTI:            0.470 m AV Peak Grad:      24.4 mmHg AV Mean Grad:      12.0 mmHg LVOT Vmax:         134.00 cm/s LVOT Vmean:        87.500 cm/s LVOT VTI:          0.239 m LVOT/AV VTI ratio: 0.51  AORTA Ao Root diam: 3.40 cm Ao Asc diam:  3.40 cm  MITRAL VALVE               TRICUSPID VALVE MV Area (PHT): 3.42 cm    TR Peak grad:   21.9 mmHg MV Decel Time: 222 msec    TR Vmax:        234.00 cm/s MV A velocity: 80.30 cm/s SHUNTS Systemic VTI:  0.24 m Systemic Diam: 2.10 cm  Carolan Clines Electronically signed by Carolan Clines Signature Date/Time: 03/26/2022/1:35:39 PM    Final               EKG Interpretation Date/Time:  Thursday July 20 2023 13:59:27 EST Ventricular Rate:  71 PR Interval:  176 QRS Duration:  108 QT Interval:  414 QTC Calculation: 449 R Axis:   17  Text Interpretation: Normal sinus rhythm Normal ECG When compared with ECG of 08-Feb-2023 15:37, Previous ECG has undetermined rhythm, needs review Questionable change in QRS axis Confirmed by Micah Flesher ([id number]) on 07/20/2023 2:11:00 PM    Recent Labs: 12/16/2022: ALT 17 05/04/2023: Hemoglobin 12.1; Platelet Count 212 05/16/2023: BUN 22; Creatinine, Ser 1.14; Potassium 4.3; Sodium 140; TSH 2.65  Recent Lipid Panel  Component Value Date/Time   CHOL 128 05/16/2023 1452   CHOL 151 12/17/2020 1017   TRIG 179.0 (H) 05/16/2023 1452   HDL 43.90 05/16/2023 1452   HDL 40 12/17/2020 1017   CHOLHDL 3 05/16/2023 1452   VLDL 35.8 05/16/2023 1452   LDLCALC 49 05/16/2023 1452   LDLCALC 89  12/17/2020 1017   LDLDIRECT 62.0 08/10/2021 1520     Risk Assessment/Calculations:                Physical Exam:    VS:  BP 132/68   Pulse 71   Ht 5\' 6"  (1.676 m)   Wt 236 lb 9.6 oz (107.3 kg)   SpO2 96%   BMI 38.19 kg/m     Wt Readings from Last 3 Encounters:  07/20/23 236 lb 9.6 oz (107.3 kg)  05/16/23 240 lb 3.2 oz (109 kg)  05/04/23 241 lb 4.8 oz (109.5 kg)     GEN:  Well nourished, well developed in no acute distress HEENT: Normal NECK: No JVD; No carotid bruits LYMPHATICS: No lymphadenopathy CARDIAC: RRR, no murmurs, rubs, gallops RESPIRATORY:  Clear to auscultation without rales, wheezing or rhonchi  ABDOMEN: Soft, non-tender, non-distended MUSCULOSKELETAL:  edema with R > L SKIN: Warm and dry NEUROLOGIC:  Alert and oriented x 3 PSYCHIATRIC:  Normal affect   ASSESSMENT:    1. Primary hypertension   2. Chronic systolic heart failure (HCC)   3. Coronary artery disease involving native coronary artery of native heart without angina pectoris   4. Dyslipidemia, goal LDL below 70   5. Bilateral lower extremity edema   6. Deep vein thrombosis (DVT) of proximal lower extremity, unspecified chronicity, unspecified laterality (HCC)   7. Ventricular fibrillation (HCC)   8. CAD S/P percutaneous coronary angioplasty   9. Essential hypertension    PLAN:    In order of problems listed above:  Leg swelling R > L - given history of DVT, clot is on the differential - although no pain or redness - swelling is not pitting - no SOB or DOE, no orthopnea or weight gain - he would like a trial of increased lasix to see if this helps and I think this is fine - he will increase lasix to 40 mg BID (second dose at lunch) x 4 days - if not improved, consider dopplers to rule out DVT   Acute on chronic diastolic heart failure Echo with preserved EF and normal RV - he is no longer on entresto or jardiance - he has unilateral leg swelling after moving to a new apartment and  eating more sodium than usual due to the move - increase lasix as above   Vfib arrest - in hospital Lifevest in place - felt secondary to significant anemia - lifevest discontinued after 6 weeks - doing well   NSTEMI - demand ischemia CAD s/p LAD PCI Heart cath 2023 with nonobstructive disease Continue risk factor management   Hx of DVT/PE Chronic IDA with frequent blood transfusions Previously on xarelto - recommended for discontinuation of OAC given bleeding hx and chronic IDA Now with IVC filter in place   Hypertension Current medications: 5 mg amlodipine, 40 mg lasix, 25 mg hydralazine BID (forgets afternoon dose), 25 mg spironolactone   Hyperlipidemia 05/16/2023: Cholesterol 128; HDL 43.90; LDL Cholesterol 49; Triglycerides 179.0; VLDL 35.8 On 80 mg lipitor and zeita      Follow up with Dr. Swaziland in 2-3 weeks.     Medication Adjustments/Labs and Tests Ordered: Current medicines  are reviewed at length with the patient today.  Concerns regarding medicines are outlined above.  Orders Placed This Encounter  Procedures   EKG 12-Lead   Meds ordered this encounter  Medications   DISCONTD: spironolactone (ALDACTONE) 25 MG tablet    Sig: Take 1 tablet (25 mg total) by mouth daily.    Dispense:  90 tablet    Refill:  3   DISCONTD: furosemide (LASIX) 40 MG tablet    Sig: Take 1 tablet (40 mg total) by mouth daily.    Dispense:  90 tablet    Refill:  3   DISCONTD: ezetimibe (ZETIA) 10 MG tablet    Sig: Take 1 tablet (10 mg total) by mouth daily.    Dispense:  100 tablet    Refill:  2    Please send a replace/new response with 100-Day Supply if appropriate to maximize member benefit. Requesting 1 year supply.   DISCONTD: atorvastatin (LIPITOR) 80 MG tablet    Sig: Take 1 tablet (80 mg total) by mouth daily.    Dispense:  90 tablet    Refill:  3   DISCONTD: dapagliflozin propanediol (FARXIGA) 10 MG TABS tablet    Sig: Take 1 tablet (10 mg total) by mouth daily  before breakfast.    Dispense:  90 tablet    Refill:  3   spironolactone (ALDACTONE) 25 MG tablet    Sig: Take 1 tablet (25 mg total) by mouth daily.    Dispense:  90 tablet    Refill:  3    Hold RX. Pt doesn't need refill at this time.   furosemide (LASIX) 40 MG tablet    Sig: Take 1 tablet (40 mg total) by mouth daily.    Dispense:  90 tablet    Refill:  3    Hold RX. Pt doesn't need refill at this time.   ezetimibe (ZETIA) 10 MG tablet    Sig: Take 1 tablet (10 mg total) by mouth daily.    Dispense:  100 tablet    Refill:  2    Please send a replace/new response with 100-Day Supply if appropriate to maximize member benefit. Requesting 1 year supply. Hold RX. Pt doesn't need refill at this time.   atorvastatin (LIPITOR) 80 MG tablet    Sig: Take 1 tablet (80 mg total) by mouth daily.    Dispense:  90 tablet    Refill:  3    Hold RX. Pt doesn't need refill at this time.    Patient Instructions  Medication Instructions:  Increase Lasix 40 mg in the morning and 40 mg at lunch for 4 days. Then resume 40 mg daily.  *If you need a refill on your cardiac medications before your next appointment, please call your pharmacy*   Lab Work: NONE ordered at this time of appointment    Testing/Procedures: NONE ordered at this time of appointment     Follow-Up: At [facility], you and your health needs are our priority.  As part of our continuing mission to provide you with exceptional heart care, we have created designated Provider Care Teams.  These Care Teams include your primary Cardiologist (physician) and Advanced Practice Providers (APPs -  Physician Assistants and Nurse Practitioners) who all work together to provide you with the care you need, when you need it.  We recommend signing up for the patient portal called "MyChart".  Sign up information is provided on this After Visit Summary.  MyChart is used to connect  with patients for Virtual Visits (Telemedicine).   Patients are able to view lab/test results, encounter notes, upcoming appointments, etc.  Non-urgent messages can be sent to your provider as well.   To learn more about what you can do with MyChart, go to [email or web address].    Your next appointment:    Keep follow up   Provider:   Peter Swaziland, MD     Other Instructions            Signed, Marcelino Duster, [place]  07/20/2023 4:17 PM    [facility]

## 2023-07-18 ENCOUNTER — Ambulatory Visit: Payer: Medicare Other | Admitting: General Practice

## 2023-07-20 ENCOUNTER — Ambulatory Visit: Payer: Medicare Other | Attending: Physician Assistant | Admitting: Physician Assistant

## 2023-07-20 ENCOUNTER — Encounter: Payer: Self-pay | Admitting: Physician Assistant

## 2023-07-20 VITALS — BP 132/68 | HR 71 | Ht 66.0 in | Wt 236.6 lb

## 2023-07-20 DIAGNOSIS — E785 Hyperlipidemia, unspecified: Secondary | ICD-10-CM

## 2023-07-20 DIAGNOSIS — I10 Essential (primary) hypertension: Secondary | ICD-10-CM

## 2023-07-20 DIAGNOSIS — R600 Localized edema: Secondary | ICD-10-CM | POA: Diagnosis not present

## 2023-07-20 DIAGNOSIS — I2510 Atherosclerotic heart disease of native coronary artery without angina pectoris: Secondary | ICD-10-CM | POA: Diagnosis not present

## 2023-07-20 DIAGNOSIS — I824Y9 Acute embolism and thrombosis of unspecified deep veins of unspecified proximal lower extremity: Secondary | ICD-10-CM | POA: Diagnosis not present

## 2023-07-20 DIAGNOSIS — I4901 Ventricular fibrillation: Secondary | ICD-10-CM | POA: Diagnosis not present

## 2023-07-20 DIAGNOSIS — I5022 Chronic systolic (congestive) heart failure: Secondary | ICD-10-CM | POA: Diagnosis not present

## 2023-07-20 DIAGNOSIS — Z9861 Coronary angioplasty status: Secondary | ICD-10-CM

## 2023-07-20 MED ORDER — SPIRONOLACTONE 25 MG PO TABS: 25.0000 mg | ORAL_TABLET | Freq: Every day | ORAL | 3 refills | Status: DC

## 2023-07-20 MED ORDER — EZETIMIBE 10 MG PO TABS: 10.0000 mg | ORAL_TABLET | Freq: Every day | ORAL | 2 refills | Status: DC

## 2023-07-20 MED ORDER — ATORVASTATIN CALCIUM 80 MG PO TABS: 80.0000 mg | ORAL_TABLET | Freq: Every day | ORAL | 3 refills | Status: DC

## 2023-07-20 MED ORDER — FUROSEMIDE 40 MG PO TABS: 40.0000 mg | ORAL_TABLET | Freq: Every day | ORAL | 3 refills | Status: AC

## 2023-07-20 MED ORDER — SPIRONOLACTONE 25 MG PO TABS: 25.0000 mg | ORAL_TABLET | Freq: Every day | ORAL | 3 refills | Status: AC

## 2023-07-20 MED ORDER — ATORVASTATIN CALCIUM 80 MG PO TABS: 80.0000 mg | ORAL_TABLET | Freq: Every day | ORAL | 3 refills | Status: AC

## 2023-07-20 MED ORDER — DAPAGLIFLOZIN PROPANEDIOL 10 MG PO TABS: 10.0000 mg | ORAL_TABLET | Freq: Every day | ORAL | 3 refills | Status: DC

## 2023-07-20 MED ORDER — EZETIMIBE 10 MG PO TABS: 10.0000 mg | ORAL_TABLET | Freq: Every day | ORAL | 2 refills | Status: AC

## 2023-07-20 MED ORDER — FUROSEMIDE 40 MG PO TABS: 40.0000 mg | ORAL_TABLET | Freq: Every day | ORAL | 3 refills | Status: DC

## 2023-07-20 NOTE — Patient Instructions (Signed)
Medication Instructions:  Increase Lasix 40 mg in the morning and 40 mg at lunch for 4 days. Then resume 40 mg daily.  *If you need a refill on your cardiac medications before your next appointment, please call your pharmacy*   Lab Work: NONE ordered at this time of appointment    Testing/Procedures: NONE ordered at this time of appointment     Follow-Up: At [facility], you and your health needs are our priority.  As part of our continuing mission to provide you with exceptional heart care, we have created designated Provider Care Teams.  These Care Teams include your primary Cardiologist (physician) and Advanced Practice Providers (APPs -  Physician Assistants and Nurse Practitioners) who all work together to provide you with the care you need, when you need it.  We recommend signing up for the patient portal called "MyChart".  Sign up information is provided on this After Visit Summary.  MyChart is used to connect with patients for Virtual Visits (Telemedicine).  Patients are able to view lab/test results, encounter notes, upcoming appointments, etc.  Non-urgent messages can be sent to your provider as well.   To learn more about what you can do with MyChart, go to [email or web address].    Your next appointment:    Keep follow up   Provider:   Peter Swaziland, MD     Other Instructions

## 2023-07-25 ENCOUNTER — Other Ambulatory Visit: Payer: Self-pay | Admitting: Internal Medicine

## 2023-07-25 DIAGNOSIS — M170 Bilateral primary osteoarthritis of knee: Secondary | ICD-10-CM

## 2023-07-25 DIAGNOSIS — M545 Low back pain, unspecified: Secondary | ICD-10-CM

## 2023-08-01 DIAGNOSIS — M5442 Lumbago with sciatica, left side: Secondary | ICD-10-CM | POA: Diagnosis not present

## 2023-08-01 DIAGNOSIS — M25562 Pain in left knee: Secondary | ICD-10-CM | POA: Diagnosis not present

## 2023-08-01 DIAGNOSIS — M545 Low back pain, unspecified: Secondary | ICD-10-CM | POA: Diagnosis not present

## 2023-08-01 DIAGNOSIS — M7918 Myalgia, other site: Secondary | ICD-10-CM | POA: Diagnosis not present

## 2023-08-04 DIAGNOSIS — I739 Peripheral vascular disease, unspecified: Secondary | ICD-10-CM | POA: Diagnosis not present

## 2023-08-04 DIAGNOSIS — L603 Nail dystrophy: Secondary | ICD-10-CM | POA: Diagnosis not present

## 2023-08-05 NOTE — Progress Notes (Signed)
 Cardiology Office Note:    Date:  08/08/2023   ID:  AKEEL REFFNER, DOB Mar 26, 1946, MRN [id number]  PCP:  Joshua Debby LITTIE, MD   [facility] Providers Cardiologist:  Khanh Tanori, MD Cardiology APP:  Madie Jon Garre, [place]     Referring MD: Joshua Debby LITTIE, MD   Chief Complaint  Patient presents with   Coronary Artery Disease   Edema    History of Present Illness:    Aaron [name] is a 78 y.o. male seen for follow up edema. 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  He has a history of stenting to his mid LAD in 2005 following a nuclear stress test in that showed evidence of anterior apical ischemia.  Follow-up Myoview  in 2014 was normal.  He was anticoagulated on Xarelto  for recurrent PE/DVT, but with a history of chronic IDA requiring transfusions.  Hypertension was managed by his PCP. He was recently hospitalized 03/2022 with an in-hospital Vfib arrest in the setting of GI bleed and Hb of 4.8. Initial EKG with ST depressions. LHC revealed nonobstructive CAD, no intervention. Telemetry with sinus rhythm. Suspected demand  ischemia in the setting of severe anemia. However, due to risk of re-bleed, he was discharged with a lifevest.  GI workup notable for EGD showing gastric antral vascular ectasia treated with APC, felt to be extremely high risk for re-bleed. GI recommended complete discontinuation of OAC. Therefore, Xarelto  held and IVC filter was placed by IR. His last EGD on April 14 showed multiple gastric polyps and GAVE treated again with APC.   He was seen recently in our office with increased unilateral edema. His lasix  was increased for several days. He states that last Friday his legs looked normal. Notes some mild swelling today. He has recently moved. Eating more TV dinners. Tries to get lower salt item. No SOB.      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   Heart murmur    Hyperlipidemia    Hypertension    Insomnia    Osteoarthritis    PONV (postoperative nausea and vomiting)    2006 - knee surgery   Pre-diabetes    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79   Psoriasis    PTSD (post-traumatic stress disorder)    Pulmonary embolism (HCC) ~ 03/2006   bilaterally   Seasonal allergies    Shortness of breath 06/08/2011   w/exertion; that's why I'm here   Sleep apnea    Sleep apnea, obstructive    Umbilical hernia     Past Surgical History:  Procedure Laterality Date   BIOPSY  10/17/2022   Procedure: BIOPSY;  Surgeon: Eda Iha, MD;  Location: [facility];  Service: Gastroenterology;;   BUBBLE STUDY  03/24/2022   Procedure: BUBBLE STUDY;  Surgeon: Eda Iha, MD;  Location: [facility]COPY;  Service: Gastroenterology;;   CARDIAC  CATHETERIZATION     carpel tunnel release Bilateral ~ 06/2005/~ 08/2005   right/left   COLONOSCOPY W/ BIOPSIES AND POLYPECTOMY     COLONOSCOPY WITH PROPOFOL  N/A 08/11/2022   Procedure: COLONOSCOPY WITH PROPOFOL ;  Surgeon: Eda Iha, MD;  Location: [facility];  Service: Gastroenterology;  Laterality: N/A;   CORONARY ANGIOPLASTY     CORONARY STENT PLACEMENT  02/13/2004   1   ENUCLEATION Right    age 22   ESOPHAGOGASTRODUODENOSCOPY (EGD) WITH PROPOFOL  N/A 03/24/2022   Procedure: ESOPHAGOGASTRODUODENOSCOPY (EGD) WITH PROPOFOL ;  Surgeon: Eda Iha, MD;  Location: [facility] ENDOSCOPY;  Service: Gastroenterology;  Laterality: N/A;   ESOPHAGOGASTRODUODENOSCOPY (EGD) WITH PROPOFOL  N/A 08/11/2022   Procedure: ESOPHAGOGASTRODUODENOSCOPY (EGD) WITH PROPOFOL ;  Surgeon:  Eda Iha, MD;  Location: [facility];  Service: Gastroenterology;  Laterality: N/A;   ESOPHAGOGASTRODUODENOSCOPY (EGD) WITH PROPOFOL  N/A 10/17/2022   Procedure: ESOPHAGOGASTRODUODENOSCOPY (EGD) WITH PROPOFOL ;  Surgeon: Eda Iha, MD;  Location: [facility];  Service: Gastroenterology;  Laterality: N/A;   EYE SURGERY     age 65; ruptured  right pupil   EYE SURGERY     age 63   EYE SURGERY  1957   placed artificial right eye   HEMOSTASIS CONTROL  03/24/2022   Procedure: HEMOSTASIS CONTROL;  Surgeon: Eda Iha, MD;  Location: [facility] ENDOSCOPY;  Service: Gastroenterology;;   HOT HEMOSTASIS N/A 03/24/2022   Procedure: HOT HEMOSTASIS (ARGON PLASMA COAGULATION/BICAP);  Surgeon: Eda Iha, MD;  Location: [facility] ENDOSCOPY;  Service: Gastroenterology;  Laterality: N/A;   HOT HEMOSTASIS N/A 08/11/2022   Procedure: HOT HEMOSTASIS (ARGON PLASMA COAGULATION/BICAP);  Surgeon: Eda Iha, MD;  Location: [facility];  Service: Gastroenterology;  Laterality: N/A;  stomach   HOT HEMOSTASIS N/A 10/17/2022   Procedure: HOT HEMOSTASIS (ARGON PLASMA COAGULATION/BICAP);  Surgeon: Eda Iha, MD;  Location: [facility];  Service: Gastroenterology;  Laterality: N/A;   INGUINAL HERNIA REPAIR Right 1983   INGUINAL HERNIA REPAIR Left 1985   IR IVC FILTER PLMT / S&I /IMG GUID/MOD SED  03/29/2022   KNEE ARTHROSCOPY  ~ 06/2005   right   LEFT HEART CATH AND CORONARY ANGIOGRAPHY N/A 03/25/2022   Procedure: LEFT HEART CATH AND CORONARY ANGIOGRAPHY;  Surgeon: Wendel Lurena POUR, MD;  Location: [facility];  Service: Cardiovascular;  Laterality: N/A;   MULTIPLE TOOTH EXTRACTIONS     POLYPECTOMY  08/11/2022   Procedure: POLYPECTOMY;  Surgeon: Eda Iha, MD;  Location: [facility];  Service: Gastroenterology;;  colon   spinal injections  2006-2007   maybe 10 injections for herniated discs   TEE WITHOUT CARDIOVERSION  03/24/2022   Procedure: TRANSESOPHAGEAL ECHOCARDIOGRAM (TEE);   Surgeon: Eda Iha, MD;  Location: [facility] ENDOSCOPY;  Service: Gastroenterology;;   TONSILLECTOMY     when I was real young   TOTAL KNEE ARTHROPLASTY Left 10/09/2015   Procedure: TOTAL KNEE ARTHROPLASTY;  Surgeon: Norleen Gavel, MD;  Location: [facility] OR;  Service: Orthopedics;  Laterality: Left;   TOTAL KNEE ARTHROPLASTY Right 04/04/2016   Procedure: TOTAL KNEE ARTHROPLASTY;  Surgeon: Norleen Gavel, MD;  Location: [facility] OR;  Service: Orthopedics;  Laterality: Right;    Current Medications: No outpatient medications have been marked as taking for the 08/08/23 encounter (Office Visit) with Tvisha Schwoerer M, MD.     Allergies:   Wellbutrin [bupropion] and Amoxicillin   Social History   Socioeconomic History   Marital status: Married    Spouse name: Not on file   Number of children: 3   Years of education: Not on file  Highest education level: 10th grade  Occupational History   Occupation: building maintenance    Comment: disabled  Tobacco Use   Smoking status: Former    Current packs/day: 0.00    Average packs/day: 1.5 packs/day for 25.0 years (37.5 ttl pk-yrs)    Types: Cigarettes    Start date: 07/09/1970    Quit date: 07/10/1995    Years since quitting: 28.0   Smokeless tobacco: Never  Vaping Use   Vaping status: Never Used  Substance and Sexual Activity   Alcohol use: No   Drug use: No   Sexual activity: Not Currently    Partners: Female    Comment: married  Other Topics Concern   Not on file  Social History Narrative   Former education administrator, 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   Recent stress, currently undergoing financial difficulties.   Social Drivers of Corporate Investment Banker Strain: Low Risk  (05/12/2023)   Overall Financial Resource Strain (CARDIA)    Difficulty of Paying Living Expenses: Not very hard  Food Insecurity: No Food Insecurity (05/12/2023)   Hunger Vital Sign    Worried About Running Out of Food in the Last Year: Never true    Ran Out of Food in  the Last Year: Never true  Transportation Needs: No Transportation Needs (05/12/2023)   PRAPARE - Administrator, Civil Service (Medical): No    Lack of Transportation (Non-Medical): No  Physical Activity: Insufficiently Active (05/12/2023)   Exercise Vital Sign    Days of Exercise per Week: 2 days    Minutes of Exercise per Session: 20 min  Stress: No Stress Concern Present (05/12/2023)   [employer] of Occupational Health - Occupational Stress Questionnaire    Feeling of Stress : Only a little  Social Connections: Socially Isolated (05/12/2023)   Social Connection and Isolation Panel [NHANES]    Frequency of Communication with Friends and Family: Twice a week    Frequency of Social Gatherings with Friends and Family: Never    Attends Religious Services: Never    Diplomatic Services Operational Officer: No    Attends Engineer, Structural: Never    Marital Status: Married     Family History: The patient's family history includes Asthma in his brother, father, and sister; Breast cancer in his sister; Emphysema in his father; Liver cancer in his sister. There is no history of Colon cancer, Esophageal cancer, Colon polyps, Rectal cancer, or Stomach cancer.  ROS:   Please see the history of present illness.     All other systems reviewed and are negative.  EKGs/Labs/Other Studies Reviewed:    The following studies were reviewed today:  Left heart cath 03/25/22:   Dist LAD lesion is 10% stenosed.   1st Mrg lesion is 30% stenosed.   1.  Diffuse mild to moderate obstructive disease with patent mid LAD stent. 2.  Elevated LVEDP of 25 mmHg.   Commendation: Continued medical therapy.     Echo 03/26/22:  1. Study not well visualized to full assess endocardial borders. Left  ventricular ejection fraction, by estimation, is 55 to 60%. The left  ventricle has normal function. The left ventricle has no regional wall  motion abnormalities. There is mild left   ventricular hypertrophy. Left ventricular diastolic parameters were  normal.   2. Right ventricular systolic function is normal. The right ventricular  size is normal. There is normal pulmonary artery systolic pressure.   3. The mitral valve  is normal in structure. No evidence of mitral valve  regurgitation.   4. The aortic valve was not well visualized. Aortic valve regurgitation  is not visualized.   5. The inferior vena cava is normal in size with greater than 50%  respiratory variability, suggesting right atrial pressure of 3 mmHg.      TEE 03/24/22: See external report  EKG:  EKG is not ordered today.    Recent Labs: 12/16/2022: ALT 17 05/04/2023: Hemoglobin 12.1; Platelet Count 212 05/16/2023: BUN 22; Creatinine, Ser 1.14; Potassium 4.3; Sodium 140; TSH 2.65  Recent Lipid Panel    Component Value Date/Time   CHOL 128 05/16/2023 1452   CHOL 151 12/17/2020 1017   TRIG 179.0 (H) 05/16/2023 1452   HDL 43.90 05/16/2023 1452   HDL 40 12/17/2020 1017   CHOLHDL 3 05/16/2023 1452   VLDL 35.8 05/16/2023 1452   LDLCALC 49 05/16/2023 1452   LDLCALC 89 12/17/2020 1017   LDLDIRECT 62.0 08/10/2021 1520     Risk Assessment/Calculations:               Physical Exam:    VS:  BP 134/73   Pulse 93   Ht 5' 6 (1.676 m)   Wt 231 lb (104.8 kg)   SpO2 96%   BMI 37.28 kg/m     Wt Readings from Last 3 Encounters:  08/08/23 231 lb (104.8 kg)  07/20/23 236 lb 9.6 oz (107.3 kg)  05/16/23 240 lb 3.2 oz (109 kg)     GEN:  Well nourished, well developed in no acute distress HEENT: Normal NECK: No JVD; No carotid bruits LYMPHATICS: No lymphadenopathy CARDIAC: RRR, no murmurs, rubs, gallops RESPIRATORY:  Clear to auscultation without rales, wheezing or rhonchi  ABDOMEN: Soft, non-tender, non-distended MUSCULOSKELETAL:  trace edema; No deformity  SKIN: Warm and dry NEUROLOGIC:  Alert and oriented x 3 PSYCHIATRIC:  Normal affect   ASSESSMENT:    1. Bilateral lower  extremity edema   2. Chronic systolic heart failure (HCC)   3. Coronary artery disease involving native coronary artery of native heart without angina pectoris   4. Primary hypertension      PLAN:    In order of problems listed above:  Lower extremity edema. Suspect a component of venous insufficiency with prior bilateral TKR and prior DVT. Could have some diastolic CHF as well. Recommend continuing current lasix  and aldactone  dose. Discussed importance of low sodium diet, foot elevation at rest and wearing compression hose. Can take extra lasix  if really needed.   2. NSTEMI - demand ischemia- also in setting of profound anemia CAD s/p old LAD PCI. Cardiac cath showed patent stent with mild nonobstructive CAD Medical management.    3. Chronic diastolic heart failure- no evidence of volume overload.  Echo with preserved EF and normal RV No indication for Entresto  and Jardiance  at this time.  On lasix  and aldactone . If swelling worsens would consider resuming Jardiance .    4. HTN controlled.   Follow up in 4-6 months.       Medication Adjustments/Labs and Tests Ordered: Current medicines are reviewed at length with the patient today.  Concerns regarding medicines are outlined above.  No orders of the defined types were placed in this encounter.  No orders of the defined types were placed in this encounter.   There are no Patient Instructions on file for this visit.   Signed, Maurie Musco, MD  08/08/2023 4:51 PM    [facility]

## 2023-08-08 ENCOUNTER — Ambulatory Visit: Payer: Medicare Other | Attending: Cardiology | Admitting: Cardiology

## 2023-08-08 ENCOUNTER — Encounter: Payer: Self-pay | Admitting: Cardiology

## 2023-08-08 VITALS — BP 134/73 | HR 93 | Ht 66.0 in | Wt 231.0 lb

## 2023-08-08 DIAGNOSIS — I10 Essential (primary) hypertension: Secondary | ICD-10-CM

## 2023-08-08 DIAGNOSIS — I5022 Chronic systolic (congestive) heart failure: Secondary | ICD-10-CM | POA: Diagnosis not present

## 2023-08-08 DIAGNOSIS — R600 Localized edema: Secondary | ICD-10-CM

## 2023-08-08 DIAGNOSIS — I2510 Atherosclerotic heart disease of native coronary artery without angina pectoris: Secondary | ICD-10-CM

## 2023-08-08 NOTE — Patient Instructions (Addendum)
Medication Instructions:  Continue same medications *If you need a refill on your cardiac medications before your next appointment, please call your pharmacy*   Lab Work: None ordered   Testing/Procedures: None ordered   Follow-Up: At [facility], you and your health needs are our priority.  As part of our continuing mission to provide you with exceptional heart care, we have created designated Provider Care Teams.  These Care Teams include your primary Cardiologist (physician) and Advanced Practice Providers (APPs -  Physician Assistants and Nurse Practitioners) who all work together to provide you with the care you need, when you need it.  We recommend signing up for the patient portal called "MyChart".  Sign up information is provided on this After Visit Summary.  MyChart is used to connect with patients for Virtual Visits (Telemedicine).  Patients are able to view lab/test results, encounter notes, upcoming appointments, etc.  Non-urgent messages can be sent to your provider as well.   To learn more about what you can do with MyChart, go to [email or web address].    Your next appointment:  4 months    Provider:  Dr.Jordan           May Take a extra Lasix if needed for swelling  Wear Compression hose during the day  Low Sodium Diet

## 2023-08-16 ENCOUNTER — Other Ambulatory Visit: Payer: Self-pay | Admitting: Internal Medicine

## 2023-08-16 DIAGNOSIS — F411 Generalized anxiety disorder: Secondary | ICD-10-CM

## 2023-08-16 DIAGNOSIS — F32A Depression, unspecified: Secondary | ICD-10-CM

## 2023-08-16 NOTE — Telephone Encounter (Signed)
Copied from CRM [id number]. Topic: Clinical - Medication Refill >> Aug 16, 2023 11:22 AM Marica Otter wrote: Most Recent Primary Care Visit:  Provider: [name]  Department: [facility]  Visit Type: OFFICE VISIT  Date: 05/16/2023  Medication: venlafaxine XR (EFFEXOR-XR) 150 MG 24 hr capsule  Has the patient contacted their pharmacy? Yes, Need new presciption (Agent: If no, request that the patient contact the pharmacy for the refill. If patient does not wish to contact the pharmacy document the reason why and proceed with request.) (Agent: If yes, when and what did the pharmacy advise?)  Is this the correct pharmacy for this prescription? Yes If no, delete pharmacy and type the correct one.  This is the patient's preferred pharmacy:    [facility]) - [place], [place] - [address] [facility] [address] [place] [address] [place] [address] Phone: [phone] Fax: [phone]     Has the prescription been filled recently? No  Is the patient out of the medication? No  Has the patient been seen for an appointment in the last year OR does the patient have an upcoming appointment? Yes  Can we respond through MyChart? No  Agent: Please be advised that Rx refills may take up to 3 business days. We ask that you follow-up with your pharmacy.

## 2023-08-17 MED ORDER — VENLAFAXINE HCL ER 150 MG PO CP24: 150.0000 mg | ORAL_CAPSULE | Freq: Every day | ORAL | 0 refills | Status: DC

## 2023-09-01 ENCOUNTER — Telehealth: Payer: Self-pay | Admitting: Internal Medicine

## 2023-09-01 NOTE — Telephone Encounter (Signed)
 Rescheduled appointments per provider on call. Patient is aware of the changes made and will be mailed an appointment reminder.

## 2023-09-12 DIAGNOSIS — M25552 Pain in left hip: Secondary | ICD-10-CM | POA: Diagnosis not present

## 2023-09-12 DIAGNOSIS — M25562 Pain in left knee: Secondary | ICD-10-CM | POA: Diagnosis not present

## 2023-09-12 DIAGNOSIS — M545 Low back pain, unspecified: Secondary | ICD-10-CM | POA: Diagnosis not present

## 2023-09-20 DIAGNOSIS — Z961 Presence of intraocular lens: Secondary | ICD-10-CM | POA: Diagnosis not present

## 2023-09-20 DIAGNOSIS — Z970 Presence of artificial eye: Secondary | ICD-10-CM | POA: Diagnosis not present

## 2023-09-20 DIAGNOSIS — H353121 Nonexudative age-related macular degeneration, left eye, early dry stage: Secondary | ICD-10-CM | POA: Diagnosis not present

## 2023-09-20 DIAGNOSIS — H40012 Open angle with borderline findings, low risk, left eye: Secondary | ICD-10-CM | POA: Diagnosis not present

## 2023-10-08 ENCOUNTER — Other Ambulatory Visit: Payer: Self-pay | Admitting: Internal Medicine

## 2023-10-08 DIAGNOSIS — M170 Bilateral primary osteoarthritis of knee: Secondary | ICD-10-CM

## 2023-10-08 DIAGNOSIS — M545 Low back pain, unspecified: Secondary | ICD-10-CM

## 2023-10-11 ENCOUNTER — Telehealth: Payer: Self-pay | Admitting: Internal Medicine

## 2023-10-11 NOTE — Telephone Encounter (Signed)
 Copied from CRM [id number]. Topic: Clinical - Medication Refill >> Oct 11, 2023  3:48 PM Desma Mcgregor wrote: Most Recent Primary Care Visit:  Provider: [name]  Department[facility]  Visit Type: OFFICE VISIT  Date: 05/16/2023  Medication: traMADol (ULTRAM) 50 MG tablet tablet with frequency of every 8 hours PRN. Was prescribed this back on 07/26/23. Not sure why the doctor has changed it to every 12 hours PRN.  Has the patient contacted their pharmacy? Yes. Pt had picked up his rx and returned it.   Is this the correct pharmacy for this prescription? Yes If no, delete pharmacy and type the correct one.  This is the patient's preferred pharmacy:  [id number] - [place], [place] - [address] AT [facility] [place] [address] Phone: [phone] Fax: [phone]  Has the prescription been filled recently? Yes  Is the patient out of the medication? No, but only has a few days left.   Has the patient been seen for an appointment in the last year OR does the patient have an upcoming appointment? Yes  Can we respond through MyChart? Yes  Agent: Please be advised that Rx refills may take up to 3 business days. We ask that you follow-up with your pharmacy.

## 2023-10-13 NOTE — Telephone Encounter (Signed)
 Medication has been sent in

## 2023-10-17 ENCOUNTER — Other Ambulatory Visit: Payer: Self-pay | Admitting: Internal Medicine

## 2023-10-17 DIAGNOSIS — I10 Essential (primary) hypertension: Secondary | ICD-10-CM

## 2023-10-23 ENCOUNTER — Other Ambulatory Visit: Payer: Self-pay | Admitting: Internal Medicine

## 2023-10-23 DIAGNOSIS — M545 Low back pain, unspecified: Secondary | ICD-10-CM

## 2023-10-23 DIAGNOSIS — M170 Bilateral primary osteoarthritis of knee: Secondary | ICD-10-CM

## 2023-10-23 MED ORDER — TRAMADOL HCL 50 MG PO TABS: 50.0000 mg | ORAL_TABLET | Freq: Four times a day (QID) | ORAL | 0 refills | Status: DC | PRN

## 2023-10-23 MED ORDER — TRAMADOL HCL 50 MG PO TABS
50.0000 mg | ORAL_TABLET | Freq: Four times a day (QID) | ORAL | 0 refills | Status: AC | PRN
Start: 2023-10-23 — End: ?

## 2023-10-23 NOTE — Telephone Encounter (Signed)
 Copied from CRM [id number]. Topic: Clinical - Medication Question >> Oct 23, 2023  2:41 PM Aisha D wrote: Reason for CRM: Patient stated that he needs his medication refilled for the traMADol  (ULTRAM ) 50 MG tablet. Patient stated that Dr.Jones sent the medication but the pharmacy is stating they won't fill it because its too early. Patient stated that the pharmacy thinks he has the medication on 10/09/23, patient stated that once he seen the medication he gave it back to the pharmacy and hasn't had the correct medication since Feb.27.25. Patient is requesting that Dr.Jones contact the pharmacy ( [facility] [address]) to get the issue resolved is possible.

## 2023-10-23 NOTE — Telephone Encounter (Deleted)
 Copied from CRM [id number]. Topic: Clinical - Medication Question >> Oct 23, 2023  2:41 PM Aisha D wrote: Reason for CRM: Patient stated that he needs his medication refilled for the traMADol  (ULTRAM ) 50 MG tablet. Patient stated that Dr.Jones sent the medication but the pharmacy is stating they won't fill it because its too early. Patient stated that the pharmacy thinks he has the medication on 10/09/23, patient stated that once he seen the medication he gave it back to the pharmacy and hasn't had the correct medication since Feb.27.25. Patient is requesting that Dr.Jones contact the pharmacy ( [facility] [address]) to get the issue resolved is possible.

## 2023-10-23 NOTE — Telephone Encounter (Signed)
 Patient said his prescription was previously 6 pills a day, but the new one was changed to 2. He would like the prescription to be changed back. Best callback is [phone].

## 2023-10-24 NOTE — Telephone Encounter (Signed)
 Patient has been made aware that Dr. Rochelle Chu has changed his prescription back. Patient has been made aware he would have to contact the pharmacy because him not being able to get his medication is an insurance thing due to the last script being "filled"

## 2023-11-03 ENCOUNTER — Encounter: Payer: Self-pay | Admitting: Physician Assistant

## 2023-11-07 ENCOUNTER — Ambulatory Visit: Admitting: Podiatry

## 2023-11-08 ENCOUNTER — Other Ambulatory Visit: Payer: Medicare Other

## 2023-11-08 ENCOUNTER — Ambulatory Visit: Payer: Medicare Other | Admitting: Internal Medicine

## 2023-11-09 ENCOUNTER — Inpatient Hospital Stay: Payer: Medicare Other | Admitting: Internal Medicine

## 2023-11-09 ENCOUNTER — Inpatient Hospital Stay: Payer: Medicare Other | Attending: Internal Medicine

## 2023-11-09 VITALS — BP 146/70 | HR 64 | Temp 98.4°F | Resp 18 | Ht 66.0 in | Wt 232.8 lb

## 2023-11-09 DIAGNOSIS — M25551 Pain in right hip: Secondary | ICD-10-CM | POA: Insufficient documentation

## 2023-11-09 DIAGNOSIS — D509 Iron deficiency anemia, unspecified: Secondary | ICD-10-CM

## 2023-11-09 DIAGNOSIS — Z86718 Personal history of other venous thrombosis and embolism: Secondary | ICD-10-CM | POA: Diagnosis not present

## 2023-11-09 DIAGNOSIS — D500 Iron deficiency anemia secondary to blood loss (chronic): Secondary | ICD-10-CM

## 2023-11-09 DIAGNOSIS — M25469 Effusion, unspecified knee: Secondary | ICD-10-CM | POA: Insufficient documentation

## 2023-11-09 DIAGNOSIS — M25552 Pain in left hip: Secondary | ICD-10-CM | POA: Insufficient documentation

## 2023-11-09 DIAGNOSIS — Z86711 Personal history of pulmonary embolism: Secondary | ICD-10-CM | POA: Insufficient documentation

## 2023-11-09 DIAGNOSIS — K922 Gastrointestinal hemorrhage, unspecified: Secondary | ICD-10-CM | POA: Insufficient documentation

## 2023-11-09 LAB — CBC WITH DIFFERENTIAL (CANCER CENTER ONLY)
Abs Immature Granulocytes: 0.04 10*3/uL (ref 0.00–0.07)
Basophils Absolute: 0.1 10*3/uL (ref 0.0–0.1)
Basophils Relative: 1 %
Eosinophils Absolute: 0.3 10*3/uL (ref 0.0–0.5)
Eosinophils Relative: 5 %
HCT: 33.7 % — ABNORMAL LOW (ref 39.0–52.0)
Hemoglobin: 11.6 g/dL — ABNORMAL LOW (ref 13.0–17.0)
Immature Granulocytes: 1 %
Lymphocytes Relative: 17 %
Lymphs Abs: 1.2 10*3/uL (ref 0.7–4.0)
MCH: 29.6 pg (ref 26.0–34.0)
MCHC: 34.4 g/dL (ref 30.0–36.0)
MCV: 86 fL (ref 80.0–100.0)
Monocytes Absolute: 0.7 10*3/uL (ref 0.1–1.0)
Monocytes Relative: 10 %
Neutro Abs: 5 10*3/uL (ref 1.7–7.7)
Neutrophils Relative %: 66 %
Platelet Count: 196 10*3/uL (ref 150–400)
RBC: 3.92 MIL/uL — ABNORMAL LOW (ref 4.22–5.81)
RDW: 12.3 % (ref 11.5–15.5)
WBC Count: 7.4 10*3/uL (ref 4.0–10.5)
nRBC: 0 % (ref 0.0–0.2)

## 2023-11-09 LAB — IRON AND IRON BINDING CAPACITY (CC-WL,HP ONLY)
Iron: 64 ug/dL (ref 45–182)
Saturation Ratios: 24 % (ref 17.9–39.5)
TIBC: 270 ug/dL (ref 250–450)
UIBC: 206 ug/dL (ref 117–376)

## 2023-11-09 LAB — FERRITIN: Ferritin: 219 ng/mL (ref 24–336)

## 2023-11-09 NOTE — Progress Notes (Signed)
 [facility] [phone]   [phone]  OFFICE PROGRESS NOTE  Aaron Knuckles, MD [address] [place] [address]  DIAGNOSIS: Microcytic anemia secondary to iron  deficiency secondary to gastrointestinal hemorrhage.  PRIOR THERAPY: Iron  infusion with Venofer  300 Mg IV weekly on as-needed basis.  CURRENT THERAPY: Ferrous sulfate  325 mg p.o. daily.  INTERVAL HISTORY: Aaron [name] 78 y.o. male returns to the clinic today for follow-up visit accompanied by his wife.Discussed the use of AI scribe software for clinical note transcription with the patient, who gave verbal consent to proceed.  History of Present Illness   Aaron [name] is a 78 year old male with microcytic anemia secondary to iron  deficiency who presents for evaluation and repeat blood work. He is accompanied by his wife.  He has a history of microcytic anemia secondary to iron  deficiency from gastrointestinal hemorrhage. He has previously received iron  infusions with Venofer  and is currently taking ferrous sulfate  325 mg orally once daily in the morning with water, along with his other medications before breakfast. His hemoglobin level has decreased slightly from 12.1 in October to 11.6 currently. He is concerned about his bowel movements, which he describes as similar to those he experienced in 2023 when he was hospitalized.  He experienced a fall about a week ago when a chair he was sitting on broke, causing him to fall onto a concrete floor. He had swelling but did not seek medical attention, opting to apply ice. His knees, which have been previously replaced, are causing significant pain, especially in the mornings. He has an upcoming appointment with his orthopedic surgeon, Dr. Murrell Arrant. He also reports swelling in his right leg.  His family history includes his daughter having a blood clot in her toe about four months ago. He has a personal history of blood clots in 2005 and 2012  that traveled to his lungs.  He recently moved to an apartment after selling his house.          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  MEDICATIONS:  Current Outpatient Medications  Medication Sig Dispense Refill   ascorbic acid (C 500/ROSE HIPS) 500 MG tablet Take 500 mg by mouth daily.  aspirin  EC 81 MG tablet Take 1 tablet (81 mg total) by mouth daily. Swallow whole. 30 tablet 0   atorvastatin  (LIPITOR) 80 MG tablet Take 1 tablet (80 mg total) by mouth daily. 90 tablet 3   Calcium  Carb-Cholecalciferol  (CALCIUM  600 + D PO) Take 1 tablet by mouth daily.     cholecalciferol  (VITAMIN D3) 25 MCG (1000 UNIT) tablet Take 1,000 Units by mouth daily.     clonazePAM  (KLONOPIN ) 0.5 MG tablet Take 1 tablet (0.5 mg total) by mouth 2 (two) times daily as needed for anxiety. 180 tablet 1   ezetimibe  (ZETIA ) 10 MG tablet Take 1 tablet (10 mg total) by mouth daily. 100 tablet 2   ferrous sulfate  325 (65 FE) MG EC tablet TAKE 1 TABLET BY MOUTH DAILY WITH BREAKFAST. SCHEDULE AN  APPOINTMENT FOR FURTHER REFILLS 90 tablet 3   furosemide  (LASIX ) 40 MG tablet Take 1 tablet (40 mg total) by mouth daily. 90 tablet 3   hydrALAZINE  (APRESOLINE ) 25 MG tablet Take 1 tablet (25 mg total) by mouth 3 (three) times daily. 270 tablet 3   Multiple Vitamins-Minerals (PRESERVISION AREDS 2) CAPS Take 1 capsule by mouth daily.     neomycin-bacitracin-polymyxin (NEOSPORIN) OINT Apply 1 Application topically as needed for wound care.     pantoprazole  (PROTONIX ) 40 MG tablet Take 1 tablet (40 mg total) by mouth daily. 90 tablet 3   polyethylene glycol (MIRALAX  / GLYCOLAX ) 17 g packet Take 17 g by mouth daily. With coffee     spironolactone  (ALDACTONE ) 25 MG tablet Take 1 tablet (25 mg total) by mouth daily. 90 tablet 3   traMADol  (ULTRAM ) 50 MG tablet Take 1 tablet (50 mg total) by mouth every 6 (six) hours as needed. 720 tablet 0   traZODone  (DESYREL ) 150 MG tablet Take 1 tablet (150 mg total) by mouth at bedtime. 90 tablet 1   venlafaxine  XR (EFFEXOR -XR) 150 MG 24 hr capsule Take 1 capsule (150 mg total) by mouth daily with breakfast. 100 capsule 0   White Petrolatum-Mineral Oil (GENTEAL TEARS NIGHT-TIME) OINT Apply 1 Application to eye at bedtime as needed (dry eyes).     No current facility-administered medications for this visit.    SURGICAL HISTORY:  Past Surgical History:  Procedure Laterality Date   BIOPSY  10/17/2022   Procedure: BIOPSY;  Surgeon: Lindle Rhea, MD;  Location: [facility] ENDOSCOPY;  Service: Gastroenterology;;   BUBBLE STUDY  03/24/2022   Procedure: BUBBLE STUDY;  Surgeon: Lindle Rhea, MD;  Location: [facility] ENDOSCOPY;  Service: Gastroenterology;;   CARDIAC CATHETERIZATION     carpel tunnel release Bilateral ~ 06/2005/~ 08/2005   right/left   COLONOSCOPY W/ BIOPSIES AND POLYPECTOMY     COLONOSCOPY WITH PROPOFOL  N/A 08/11/2022   Procedure: COLONOSCOPY WITH PROPOFOL ;  Surgeon: Lindle Rhea, MD;  Location: [facility] ENDOSCOPY;  Service: Gastroenterology;  Laterality: N/A;    CORONARY ANGIOPLASTY     CORONARY STENT PLACEMENT  02/13/2004   1   ENUCLEATION Right    age 64   ESOPHAGOGASTRODUODENOSCOPY (EGD) WITH PROPOFOL  N/A 03/24/2022   Procedure: ESOPHAGOGASTRODUODENOSCOPY (EGD) WITH PROPOFOL ;  Surgeon: Lindle Rhea, MD;  Location: [facility] ENDOSCOPY;  Service: Gastroenterology;  Laterality: N/A;   ESOPHAGOGASTRODUODENOSCOPY (EGD) WITH PROPOFOL  N/A 08/11/2022   Procedure: ESOPHAGOGASTRODUODENOSCOPY (EGD) WITH PROPOFOL ;  Surgeon: Lindle Rhea, MD;  Location: [facility] ENDOSCOPY;  Service: Gastroenterology;  Laterality: N/A;   ESOPHAGOGASTRODUODENOSCOPY (EGD) WITH PROPOFOL  N/A 10/17/2022   Procedure: ESOPHAGOGASTRODUODENOSCOPY (EGD) WITH PROPOFOL ;  Surgeon: Lindle Rhea, MD;  Location: [facility] ENDOSCOPY;  Service:  Gastroenterology;  Laterality: N/A;   EYE SURGERY     age 75; "ruptured  right pupil"   EYE SURGERY     age 26   EYE SURGERY  1957   placed artificial right eye   HEMOSTASIS CONTROL  03/24/2022   Procedure: HEMOSTASIS CONTROL;  Surgeon: Lindle Rhea, MD;  Location: [facility] ENDOSCOPY;  Service: Gastroenterology;;   HOT HEMOSTASIS N/A 03/24/2022   Procedure: HOT HEMOSTASIS (ARGON PLASMA COAGULATION/BICAP);  Surgeon: Lindle Rhea, MD;  Location: [facility] ENDOSCOPY;  Service: Gastroenterology;  Laterality: N/A;   HOT HEMOSTASIS N/A 08/11/2022   Procedure: HOT HEMOSTASIS (ARGON PLASMA COAGULATION/BICAP);  Surgeon: Lindle Rhea, MD;  Location: [facility]OSCOPY;  Service: Gastroenterology;  Laterality: N/A;  stomach   HOT HEMOSTASIS N/A 10/17/2022   Procedure: HOT HEMOSTASIS (ARGON PLASMA COAGULATION/BICAP);  Surgeon: Lindle Rhea, MD;  Location: [facility] ENDOSCOPY;  Service: Gastroenterology;  Laterality: N/A;   INGUINAL HERNIA REPAIR Right 1983   INGUINAL HERNIA REPAIR Left 1985   IR IVC FILTER PLMT / S&I /IMG GUID/MOD SED  03/29/2022   KNEE ARTHROSCOPY  ~ 06/2005   right   LEFT HEART CATH AND CORONARY ANGIOGRAPHY N/A 03/25/2022   Procedure: LEFT HEART CATH AND CORONARY  ANGIOGRAPHY;  Surgeon: Kyra Phy, MD;  Location: [facility] INVASIVE CV LAB;  Service: Cardiovascular;  Laterality: N/A;   MULTIPLE TOOTH EXTRACTIONS     POLYPECTOMY  08/11/2022   Procedure: POLYPECTOMY;  Surgeon: Lindle Rhea, MD;  Location: [facility] ENDOSCOPY;  Service: Gastroenterology;;  colon   spinal injections  2006-2007   "maybe 10 injections for herniated discs"   TEE WITHOUT CARDIOVERSION  03/24/2022   Procedure: TRANSESOPHAGEAL ECHOCARDIOGRAM (TEE);  Surgeon: Lindle Rhea, MD;  Location: [facility] ENDOSCOPY;  Service: Gastroenterology;;   TONSILLECTOMY     "when I was real young"   TOTAL KNEE ARTHROPLASTY Left 10/09/2015   Procedure: TOTAL KNEE ARTHROPLASTY;  Surgeon: Neil Balls, MD;  Location: [facility] OR;  Service: Orthopedics;  Laterality: Left;   TOTAL KNEE ARTHROPLASTY Right 04/04/2016   Procedure: TOTAL KNEE ARTHROPLASTY;  Surgeon: Neil Balls, MD;  Location: [facility] OR;  Service: Orthopedics;  Laterality: Right;    REVIEW OF SYSTEMS:  A comprehensive review of systems was negative except for: Constitutional: positive for fatigue Musculoskeletal: positive for arthralgias   PHYSICAL EXAMINATION: General appearance: alert, cooperative, fatigued, and no distress Head: Normocephalic, without obvious abnormality, atraumatic Neck: no adenopathy, no JVD, supple, symmetrical, trachea midline, and thyroid  not enlarged, symmetric, no tenderness/mass/nodules Lymph nodes: Cervical, supraclavicular, and axillary nodes normal. Resp: clear to auscultation bilaterally Back: symmetric, no curvature. ROM normal. No CVA tenderness. Cardio: regular rate and rhythm, S1, S2 normal, no murmur, click, rub or gallop GI: soft, non-tender; bowel sounds normal; no masses,  no organomegaly Extremities: extremities normal, atraumatic, no cyanosis or edema  ECOG PERFORMANCE STATUS: 1 - Symptomatic but completely ambulatory  Blood pressure (!) 146/70, pulse 64, temperature 98.4 F (36.9 C), temperature source Temporal,  resp. rate 18, height 5\' 6"  (1.676 m), weight 232 lb 12.8 oz (105.6 kg), SpO2 97%.  LABORATORY DATA: Lab Results  Component Value Date   WBC 7.4 11/09/2023   HGB 11.6 (L) 11/09/2023   HCT 33.7 (L) 11/09/2023   MCV 86.0 11/09/2023   PLT 196 11/09/2023      Chemistry      Component Value Date/Time   NA 140 05/16/2023 1452   NA 139 04/18/2022 1509   K 4.3 05/16/2023 1452   CL 105 05/16/2023 1452   CO2 26 05/16/2023 1452  BUN 22 05/16/2023 1452   BUN 24 04/18/2022 1509   CREATININE 1.14 05/16/2023 1452   CREATININE 1.24 06/15/2020 1306   CREATININE 1.16 02/10/2020 1532      Component Value Date/Time   CALCIUM  9.1 05/16/2023 1452   ALKPHOS 102 12/16/2022 1138   AST 16 12/16/2022 1138   AST 17 06/15/2020 1306   ALT 17 12/16/2022 1138   ALT 12 06/15/2020 1306   BILITOT 0.4 12/16/2022 1138   BILITOT 0.3 12/17/2020 1017   BILITOT 0.4 06/15/2020 1306       RADIOGRAPHIC STUDIES: No results found.  ASSESSMENT AND PLAN: This is a very pleasant 78 years old white male with history of iron  deficiency anemia secondary to chronic gastrointestinal blood loss. The patient is currently on oral iron  tablet with ferrous sulfate  325 mg p.o. daily and tolerating this treatment well. The patient was found recently to have significant iron  deficiency anemia. He has significant gastric antral vascular ectasia with acute blood loss few weeks ago. The patient received 3 units of PRBCs transfusion during his hospitalization. He was treated with iron  infusion with Venofer  at [facility] and tolerated it fairly well. He is currently on ferrous sulfate  325 mg p.o. daily. Assessment and Plan    Microcytic anemia due to iron  deficiency Mild microcytic anemia secondary to iron  deficiency from gastrointestinal hemorrhage. Hemoglobin level is 11.6, slightly decreased from 12.1 in October. Current treatment includes ferrous sulfate  325 mg PO daily. Awaiting iron  level results to  determine if further intervention is needed. Hemoglobin level is not expected to cause dizziness or falls unless it drops to 7 or 8. - Continue ferrous sulfate  325 mg PO daily - Await iron  level results - Arrange iron  infusion if iron  levels are low - Schedule follow-up in 3 months to monitor hemoglobin levels  Knee pain and swelling Reports knee pain and swelling, particularly in the mornings, with pain extending to the hips. Recent fall may have exacerbated symptoms. Scheduled to see orthopedic surgeon Dr. Murrell Arrant in one week for further evaluation. - Follow up with orthopedic surgeon Dr. Murrell Arrant in one week  Swelling of right leg Reports swelling of the right leg.   The patient was advised to call immediately if she has any concerning symptoms in the interval. The patient voices understanding of current disease status and treatment options and is in agreement with the current care plan.  All questions were answered. The patient knows to call the clinic with any problems, questions or concerns. We can certainly see the patient much sooner if necessary.  The total time spent in the appointment was 20 minutes.  Disclaimer: This note was dictated with voice recognition software. Similar sounding words can inadvertently be transcribed and may not be corrected upon review.

## 2023-11-15 ENCOUNTER — Other Ambulatory Visit: Payer: Self-pay | Admitting: Internal Medicine

## 2023-11-15 DIAGNOSIS — F32A Depression, unspecified: Secondary | ICD-10-CM

## 2023-11-15 DIAGNOSIS — G4700 Insomnia, unspecified: Secondary | ICD-10-CM

## 2023-11-15 DIAGNOSIS — F411 Generalized anxiety disorder: Secondary | ICD-10-CM

## 2023-11-16 DIAGNOSIS — M5442 Lumbago with sciatica, left side: Secondary | ICD-10-CM | POA: Diagnosis not present

## 2023-11-16 DIAGNOSIS — M5441 Lumbago with sciatica, right side: Secondary | ICD-10-CM | POA: Diagnosis not present

## 2023-11-17 ENCOUNTER — Other Ambulatory Visit: Payer: Self-pay | Admitting: Orthopedic Surgery

## 2023-11-17 DIAGNOSIS — M545 Low back pain, unspecified: Secondary | ICD-10-CM

## 2023-11-21 ENCOUNTER — Ambulatory Visit: Payer: Medicare Other | Admitting: Internal Medicine

## 2023-11-21 ENCOUNTER — Ambulatory Visit: Admitting: Podiatry

## 2023-11-28 ENCOUNTER — Ambulatory Visit: Admitting: Podiatry

## 2023-11-29 ENCOUNTER — Encounter (HOSPITAL_BASED_OUTPATIENT_CLINIC_OR_DEPARTMENT_OTHER): Payer: Self-pay | Admitting: Primary Care

## 2023-11-29 ENCOUNTER — Ambulatory Visit (HOSPITAL_BASED_OUTPATIENT_CLINIC_OR_DEPARTMENT_OTHER): Admitting: Primary Care

## 2023-11-29 VITALS — BP 132/88 | HR 79 | Ht 66.0 in | Wt 234.0 lb

## 2023-11-29 DIAGNOSIS — G4733 Obstructive sleep apnea (adult) (pediatric): Secondary | ICD-10-CM | POA: Diagnosis not present

## 2023-11-29 DIAGNOSIS — I2510 Atherosclerotic heart disease of native coronary artery without angina pectoris: Secondary | ICD-10-CM

## 2023-11-29 DIAGNOSIS — D509 Iron deficiency anemia, unspecified: Secondary | ICD-10-CM

## 2023-11-29 DIAGNOSIS — Z96653 Presence of artificial knee joint, bilateral: Secondary | ICD-10-CM | POA: Diagnosis not present

## 2023-11-29 NOTE — Patient Instructions (Signed)
 -  SLEEP APNEA: Sleep apnea is a condition where your breathing stops and starts repeatedly during sleep. Your sleep apnea is well-controlled with your current CPAP settings. Continue using your CPAP machine every night with the auto set pressure settings of 5 to 20 cm H2O and use the chin strap to prevent air leaks.  -CORONARY ARTERY DISEASE WITH STENT: Continue Zetia  and Lipitor to manage your cholesterol. Continue following up with your cardiologist.  -KNEE PAIN POST-REPLACEMENT: You have ongoing knee pain since your knee replacements in 2017, which disrupts your sleep. An MRI is scheduled to investigate the cause of the pain. Follow up with Dr. Murrell Arrant, your orthopedic specialist, one week after the MRI to discuss the results and management options.  INSTRUCTIONS:  1. Continue using your CPAP machine every night with the auto set pressure settings of 5 to 20 cm H2O and use the chin strap to prevent air leaks. 2. Obtain MRI of your knees on Friday at 6:10 PM. 3. Follow up with Dr. Murrell Arrant, your orthopedic specialist, one week after the MRI to discuss the results and management options.  Follow-up: 1 year with Jerlene Moody NP at [address] location

## 2023-11-29 NOTE — Progress Notes (Signed)
 @Patient  ID: Aaron [name], male    DOB: March 06, 1946, 78 y.o.   MRN: [id number]  No chief complaint on file.   Referring provider: [name], MD  HPI: 78 year old male, former smoker. PMH significant for CAD, DVT/PE, sleep apnea, GERD, OA, insomnia, iron  deficiency anemia, obesity.   11/29/2023 Discussed the use of AI scribe software for clinical note transcription with the patient, who gave verbal consent to proceed.  History of Present Illness   Aaron [name] is a 78 year old male who presents for follow-up. He is accompanied by his wife, Aaron [name].  In September 2023, he experienced severe fatigue and weakness, resulting in a fall at home. This led to an 11-day hospitalization due to symptomatic anemia and a ventricular fibrillation arrest, necessitating resuscitation. The anemia was attributed to gastrointestinal bleeding from long-term blood thinner use. He recalls being resuscitated in the emergency room and spending time in intensive care.  He has a history of sleep apnea and has been using a CPAP machine consistently since 2003. He uses the machine every night for an average of five hours, although knee pain has recently affected his sleep duration. He uses a chin strap to prevent air leaks and reports that his apnea is well controlled with a pressure setting of 5 to 20 cm H2O, with an apnea score of less than one event per hour.  He experiences bilateral knee pain, persistent since knee replacements in 2017. The pain occurs when lying on his side, disrupting sleep, and he finds relief by sitting in a recliner. An MRI is scheduled to investigate the cause of the pain.  He has a history of coronary artery disease with a stent placed in 2005, which remains patent. A cardiac catheterization in 2023 revealed mild to moderate coronary artery disease, mid LAD sent patent. He is on Zetia  and Lipitor for cholesterol management.  He has a history of lung nodules, which were stable on a CT  scan in May 2023, measuring 5 millimeters or less. He experienced pulmonary embolism. He quit smoking in January 1997.  He reports shortness of breath, which he attributes to leg fatigue and difficulty standing or walking for extended periods. He is under the care of a cardiologist and hematologist for his cardiac and anemia issues.      Airview download 08/26/23-11/23/23 Usage 89/90 days Average usage 5 hours 5 mins Pressure 5-20cm h20 Airleaks 0L/min AHI 0.7    Allergies  Allergen Reactions   Wellbutrin [Bupropion] Other (See Comments)    Hallucinations, sweating   Amoxicillin Palpitations    Immunization History  Administered Date(s) Administered   Fluad Quad(high Dose 65+) 03/29/2020   Influenza Split 05/05/2011, 04/03/2013   Influenza Whole 04/30/2012   Influenza, High Dose Seasonal PF 08/10/2015, 03/08/2016, 04/20/2017, 04/24/2018, 03/26/2019, 06/03/2021   Influenza-Unspecified 03/26/2019, 06/03/2021   PFIZER Comirnaty(Gray Top)Covid-19 Tri-Sucrose Vaccine 10/19/2020   PFIZER(Purple Top)SARS-COV-2 Vaccination 08/24/2019, 09/17/2019, 03/29/2020   Pfizer Covid-19 Vaccine Bivalent Booster 35yrs & up 06/03/2021   Pneumococcal Conjugate-13 02/11/2015   Pneumococcal Polysaccharide-23 06/09/2011, 12/17/2020   Td 02/11/2015   Zoster Recombinant(Shingrix ) 05/26/2023    Past Medical History:  Diagnosis Date   Anemia    "@ birth"   Anxiety    Asthma    Blood transfusion    "@ birth"   Cancer (HCC)    Head - skin cancer   Cataract    L eye   Constipation due to pain medication    Coronary artery disease  DES LAD 2005   Depression    Full dentures    GERD (gastroesophageal reflux disease)    Headache(784.0)    "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; 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"   Heart murmur    Hyperlipidemia    Hypertension    Insomnia    Osteoarthritis    PONV (postoperative nausea and vomiting)    2006 - knee surgery   Pre-diabetes    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70    Psoriasis    PTSD (post-traumatic stress disorder)    Pulmonary embolism (HCC) ~ 03/2006   bilaterally   Seasonal allergies    Shortness of breath 06/08/2011   "w/exertion; that's why I'm here"   Sleep apnea    Sleep apnea, obstructive    Umbilical hernia     Tobacco History: Social History   Tobacco Use  Smoking Status Former   Current packs/day: 0.00   Average packs/day: 1.5 packs/day for 25.0 years (37.5 ttl pk-yrs)   Types: Cigarettes   Start date: 07/09/1970   Quit date: 07/10/1995   Years since quitting: 28.4  Smokeless Tobacco Never   Counseling given: Not Answered   Outpatient Medications Prior to Visit  Medication Sig Dispense Refill   ascorbic acid (C 500/ROSE HIPS) 500 MG tablet Take 500 mg by mouth daily.     aspirin  EC 81 MG tablet Take 1 tablet (81 mg total) by mouth daily. Swallow whole. 30 tablet 0   atorvastatin  (LIPITOR) 80 MG tablet Take 1 tablet (80 mg total) by mouth daily. 90 tablet 3   Calcium  Carb-Cholecalciferol  (CALCIUM  600 + D PO) Take 1 tablet by mouth daily.     cholecalciferol  (VITAMIN D3) 25 MCG (1000 UNIT) tablet Take 1,000 Units by mouth daily.     clonazePAM  (KLONOPIN ) 0.5 MG tablet Take 1 tablet (0.5 mg total) by mouth 2 (two) times daily as needed for anxiety. 180 tablet 1   ezetimibe  (ZETIA ) 10 MG tablet Take 1 tablet (10 mg total) by mouth daily. 100 tablet 2   ferrous sulfate  325 (65 FE) MG EC tablet TAKE 1 TABLET BY MOUTH DAILY WITH BREAKFAST. SCHEDULE AN APPOINTMENT FOR FURTHER REFILLS 90 tablet 3   furosemide  (LASIX ) 40 MG tablet Take 1 tablet (40 mg total) by mouth daily. 90 tablet 3   hydrALAZINE  (APRESOLINE ) 25 MG tablet Take 1 tablet (25 mg total) by mouth 3 (three) times daily. 270 tablet 3   Multiple Vitamins-Minerals (PRESERVISION AREDS 2) CAPS Take 1 capsule by mouth daily.     neomycin-bacitracin-polymyxin (NEOSPORIN) OINT Apply 1 Application topically as needed for wound care.     pantoprazole  (PROTONIX ) 40 MG tablet Take 1  tablet (40 mg total) by mouth daily. 90 tablet 3   polyethylene glycol (MIRALAX  / GLYCOLAX ) 17 g packet Take 17 g by mouth daily. With coffee     spironolactone  (ALDACTONE ) 25 MG tablet Take 1 tablet (25 mg total) by mouth daily. 90 tablet 3   traMADol  (ULTRAM ) 50 MG tablet Take 1 tablet (50 mg total) by mouth every 6 (six) hours as needed. 720 tablet 0   traZODone  (DESYREL ) 150 MG tablet TAKE 1 TABLET BY MOUTH AT BEDTIME. 90 tablet 1   venlafaxine  XR (EFFEXOR -XR) 150 MG 24 hr capsule Take 1 capsule (150 mg total) by mouth daily with breakfast. 100 capsule 0   White Petrolatum-Mineral Oil (GENTEAL TEARS NIGHT-TIME) OINT Apply 1 Application to eye at bedtime as needed (dry eyes).  No facility-administered medications prior to visit.      Review of Systems  Review of Systems  Constitutional: Negative.   Respiratory: Negative.         Exertional dyspnea  Cardiovascular: Negative.   Musculoskeletal:        Knee pain     Physical Exam  BP 132/88   Pulse 79   Ht 5\' 6"  (1.676 m)   Wt 234 lb (106.1 kg)   SpO2 98%   BMI 37.77 kg/m  Physical Exam Constitutional:      General: He is not in acute distress.    Appearance: Normal appearance. He is obese. He is not ill-appearing.  HENT:     Head: Normocephalic and atraumatic.  Cardiovascular:     Rate and Rhythm: Normal rate and regular rhythm.  Pulmonary:     Effort: Pulmonary effort is normal.     Breath sounds: Normal breath sounds.     Comments: CTA Musculoskeletal:        General: Normal range of motion.  Skin:    General: Skin is warm and dry.  Neurological:     General: No focal deficit present.     Mental Status: He is alert and oriented to person, place, and time. Mental status is at baseline.  Psychiatric:        Mood and Affect: Mood normal.        Behavior: Behavior normal.        Thought Content: Thought content normal.        Judgment: Judgment normal.      Lab Results:  CBC    Component Value  Date/Time   WBC 7.4 11/09/2023 1257   WBC 6.1 12/16/2022 1138   RBC 3.92 (L) 11/09/2023 1257   HGB 11.6 (L) 11/09/2023 1257   HGB 10.3 (L) 04/18/2022 1509   HGB 13.8 05/15/2007 1520   HCT 33.7 (L) 11/09/2023 1257   HCT 33.4 (L) 04/18/2022 1509   HCT 39.6 05/15/2007 1520   PLT 196 11/09/2023 1257   PLT 323 04/18/2022 1509   MCV 86.0 11/09/2023 1257   MCV 85 04/18/2022 1509   MCV 83.3 05/15/2007 1520   MCH 29.6 11/09/2023 1257   MCHC 34.4 11/09/2023 1257   RDW 12.3 11/09/2023 1257   RDW 15.7 (H) 04/18/2022 1509   RDW 14.5 05/15/2007 1520   LYMPHSABS 1.2 11/09/2023 1257   LYMPHSABS 1.5 05/14/2020 1433   LYMPHSABS 1.2 05/15/2007 1520   MONOABS 0.7 11/09/2023 1257   MONOABS 0.6 05/15/2007 1520   EOSABS 0.3 11/09/2023 1257   EOSABS 0.3 05/14/2020 1433   BASOSABS 0.1 11/09/2023 1257   BASOSABS 0.1 05/14/2020 1433   BASOSABS 0.0 05/15/2007 1520    BMET    Component Value Date/Time   NA 140 05/16/2023 1452   NA 139 04/18/2022 1509   K 4.3 05/16/2023 1452   CL 105 05/16/2023 1452   CO2 26 05/16/2023 1452   GLUCOSE 129 (H) 05/16/2023 1452   BUN 22 05/16/2023 1452   BUN 24 04/18/2022 1509   CREATININE 1.14 05/16/2023 1452   CREATININE 1.24 06/15/2020 1306   CREATININE 1.16 02/10/2020 1532   CALCIUM  9.1 05/16/2023 1452   GFRNONAA >60 04/06/2022 1604   GFRNONAA >60 06/15/2020 1306   GFRAA >60 04/05/2016 0838    BNP    Component Value Date/Time   BNP 42.0 04/18/2022 1509   BNP 48.0 04/06/2022 1604    ProBNP    Component Value Date/Time   PROBNP 2074.0 (  H) 06/08/2011 1144    Imaging: No results found.   Assessment & Plan:   1. Sleep apnea, obstructive (Primary) - Ambulatory Referral for DME  Assessment and Plan    Sleep apnea Sleep apnea is well-controlled with current CPAP settings 5-20cm h20. He uses the CPAP machine consistently, averaging five hours per night, with no significant air leaks and an apnea score of less than one event per hour.  -  Continue current CPAP therapy with auto set pressure settings of 5 to 20. - Encourage continued use of chin strap to prevent air leaks. - Renew CPAP supplies with [employer]  Iron  deficiency anemia form chronic GI losses Hospitalized in September 2023 for severe blood loss and required resuscitation due to ventricular fibrillation. Currently under the care of a hematologist and a gastroenterologist for ongoing management.  Coronary artery disease with stent Continue Zetia  and Lipitor.  Knee pain post-replacement Bilateral knee pain post-replacement in 2017. Experiences pain when lying in bed, which disrupts sleep and necessitates moving to a recliner for relief. An MRI is scheduled to evaluate the cause of the pain. - Follow up with orthopedic specialist, Dr. Murrell Arrant, one week after MRI to discuss results and management options.   Antonio Baumgarten, NP 11/29/2023

## 2023-12-01 ENCOUNTER — Ambulatory Visit
Admission: RE | Admit: 2023-12-01 | Discharge: 2023-12-01 | Disposition: A | Discharge status: Home / Self Care | Source: Ambulatory Visit | Attending: Orthopedic Surgery | Admitting: Orthopedic Surgery

## 2023-12-01 DIAGNOSIS — M48061 Spinal stenosis, lumbar region without neurogenic claudication: Secondary | ICD-10-CM | POA: Diagnosis not present

## 2023-12-01 DIAGNOSIS — M545 Low back pain, unspecified: Secondary | ICD-10-CM

## 2023-12-01 DIAGNOSIS — M47816 Spondylosis without myelopathy or radiculopathy, lumbar region: Secondary | ICD-10-CM | POA: Diagnosis not present

## 2023-12-01 DIAGNOSIS — M5126 Other intervertebral disc displacement, lumbar region: Secondary | ICD-10-CM | POA: Diagnosis not present

## 2023-12-06 ENCOUNTER — Ambulatory Visit (INDEPENDENT_AMBULATORY_CARE_PROVIDER_SITE_OTHER): Payer: Medicare Other

## 2023-12-06 VITALS — Ht 66.0 in | Wt 234.0 lb

## 2023-12-06 DIAGNOSIS — Z0000 Encounter for general adult medical examination without abnormal findings: Secondary | ICD-10-CM | POA: Diagnosis not present

## 2023-12-06 DIAGNOSIS — G4733 Obstructive sleep apnea (adult) (pediatric): Secondary | ICD-10-CM | POA: Diagnosis not present

## 2023-12-06 NOTE — Patient Instructions (Signed)
 [name] , Thank you for taking time out of your busy schedule to complete your Annual Wellness Visit with me. I enjoyed our conversation and look forward to speaking with you again next year. I, as well as your care team,  appreciate your ongoing commitment to your health goals. Please review the following plan we discussed and let me know if I can assist you in the future. Your Game plan/ To Do List    Follow up Visits: Next Medicare AWV with our clinical staff: 12/12/2024   Have you seen your provider in the last 6 months (3 months if uncontrolled diabetes)? Yes Next Office Visit with your provider: 12/19/2023  Clinician Recommendations:  Aim for 30 minutes of exercise or brisk walking, 6-8 glasses of water, and 5 servings of fruits and vegetables each day. Educated and advised on getting the 2nd Shingles vaccine in 2025.        This is a list of the screening recommended for you and due dates:  Health Maintenance  Topic Date Due   COVID-19 Vaccine (6 - 2024-25 season) 03/05/2023   Zoster (Shingles) Vaccine (2 of 2) 07/21/2023   Flu Shot  02/02/2024   Medicare Annual Wellness Visit  12/05/2024   DTaP/Tdap/Td vaccine (2 - Tdap) 02/10/2025   Pneumonia Vaccine  Completed   Hepatitis C Screening  Completed   HPV Vaccine  Aged Out   Meningitis B Vaccine  Aged Out   Colon Cancer Screening  Discontinued    Advanced directives: (Provided) Advance directive discussed with you today. I have provided a copy for you to complete at home and have notarized. Once this is complete, please bring a copy in to our office so we can scan it into your chart.  Advance Care Planning is important because it:  [x]  Makes sure you receive the medical care that is consistent with your values, goals, and preferences  [x]  It provides guidance to your family and loved ones and reduces their decisional burden about whether or not they are making the right decisions based on your wishes.  Follow the link provided  in your after visit summary or read over the paperwork we have mailed to you to help you started getting your Advance Directives in place. If you need assistance in completing these, please reach out to us  so that we can help you!   Managing Pain Without Opioids Opioids are strong medicines used to treat moderate to severe pain. For some people, especially those who have long-term (chronic) pain, opioids may not be the best choice for pain management due to: Side effects like nausea, constipation, and sleepiness. The risk of addiction (opioid use disorder). The longer you take opioids, the greater your risk of addiction. Pain that lasts for more than 3 months is called chronic pain. Managing chronic pain usually requires more than one approach and is often provided by a team of health care providers working together (multidisciplinary approach). Pain management may be done at a pain management center or pain clinic. How to manage pain without the use of opioids Use non-opioid medicines Non-opioid medicines for pain may include: Over-the-counter or prescription non-steroidal anti-inflammatory drugs (NSAIDs). These may be the first medicines used for pain. They work well for muscle and bone pain, and they reduce swelling. Acetaminophen . This over-the-counter medicine may work well for milder pain but not swelling. Antidepressants. These may be used to treat chronic pain. A certain type of antidepressant (tricyclics) is often used. These medicines are given in  lower doses for pain than when used for depression. Anticonvulsants. These are usually used to treat seizures but may also reduce nerve (neuropathic) pain. Muscle relaxants. These relieve pain caused by sudden muscle tightening (spasms). You may also use a pain medicine that is applied to the skin as a patch, cream, or gel (topical analgesic), such as a numbing medicine. These may cause fewer side effects than medicines taken by mouth. Do certain  therapies as directed Some therapies can help with pain management. They include: Physical therapy. You will do exercises to gain strength and flexibility. A physical therapist may teach you exercises to move and stretch parts of your body that are weak, stiff, or painful. You can learn these exercises at physical therapy visits and practice them at home. Physical therapy may also involve: Massage. Heat wraps or applying heat or cold to affected areas. Electrical signals that interrupt pain signals (transcutaneous electrical nerve stimulation, TENS). Weak lasers that reduce pain and swelling (low-level laser therapy). Signals from your body that help you learn to regulate pain (biofeedback). Occupational therapy. This helps you to learn ways to function at home and work with less pain. Recreational therapy. This involves trying new activities or hobbies, such as a physical activity or drawing. Mental health therapy, including: Cognitive behavioral therapy (CBT). This helps you learn coping skills for dealing with pain. Acceptance and commitment therapy (ACT) to change the way you think and react to pain. Relaxation therapies, including muscle relaxation exercises and mindfulness-based stress reduction. Pain management counseling. This may be individual, family, or group counseling.  Receive medical treatments Medical treatments for pain management include: Nerve block injections. These may include a pain blocker and anti-inflammatory medicines. You may have injections: Near the spine to relieve chronic back or neck pain. Into joints to relieve back or joint pain. Into nerve areas that supply a painful area to relieve body pain. Into muscles (trigger point injections) to relieve some painful muscle conditions. A medical device placed near your spine to help block pain signals and relieve nerve pain or chronic back pain (spinal cord stimulation device). Acupuncture. Follow these instructions  at home Medicines Take over-the-counter and prescription medicines only as told by your health care provider. If you are taking pain medicine, ask your health care providers about possible side effects to watch out for. Do not drive or use heavy machinery while taking prescription opioid pain medicine. Lifestyle  Do not use drugs or alcohol to reduce pain. If you drink alcohol, limit how much you have to: 0-1 drink a day for women who are not pregnant. 0-2 drinks a day for men. Know how much alcohol is in a drink. In the [place] one drink equals one 12 oz bottle of beer (355 mL), one 5 oz glass of wine (148 mL), or one 1 oz glass of hard liquor (44 mL). Do not use any products that contain nicotine or tobacco. These products include cigarettes, chewing tobacco, and vaping devices, such as e-cigarettes. If you need help quitting, ask your health care provider. Eat a healthy diet and maintain a healthy weight. Poor diet and excess weight may make pain worse. Eat foods that are high in fiber. These include fresh fruits and vegetables, whole grains, and beans. Limit foods that are high in fat and processed sugars, such as fried and sweet foods. Exercise regularly. Exercise lowers stress and may help relieve pain. Ask your health care provider what activities and exercises are safe for you. If your health care  provider approves, join an exercise class that combines movement and stress reduction. Examples include yoga and tai chi. Get enough sleep. Lack of sleep may make pain worse. Lower stress as much as possible. Practice stress reduction techniques as told by your therapist. General instructions Work with all your pain management providers to find the treatments that work best for you. You are an important member of your pain management team. There are many things you can do to reduce pain on your own. Consider joining an online or in-person support group for people who have chronic pain. Keep  all follow-up visits. This is important. Where to find more information You can find more information about managing pain without opioids from: American Academy of Pain Medicine: painmed.org Institute for Chronic Pain: instituteforchronicpain.org American Chronic Pain Association: theacpa.org Contact a health care provider if: You have side effects from pain medicine. Your pain gets worse or does not get better with treatments or home therapy. You are struggling with anxiety or depression. Summary Many types of pain can be managed without opioids. Chronic pain may respond better to pain management without opioids. Pain is best managed when you and a team of health care providers work together. Pain management without opioids may include non-opioid medicines, medical treatments, physical therapy, mental health therapy, and lifestyle changes. Tell your health care providers if your pain gets worse or is not being managed well enough. This information is not intended to replace advice given to you by your health care provider. Make sure you discuss any questions you have with your health care provider. Document Revised: 09/30/2020 Document Reviewed: 09/30/2020 Elsevier Patient Education  2024 [employer].

## 2023-12-06 NOTE — Progress Notes (Signed)
 Subjective:   Aaron [name] is a 78 y.o. who presents for a Medicare Wellness preventive visit.  As a reminder, Annual Wellness Visits don't include a physical exam, and some assessments may be limited, especially if this visit is performed virtually. We may recommend an in-person follow-up visit with your provider if needed.  Visit Complete: Virtual I connected with  Aaron [name] on 12/06/23 by a audio enabled telemedicine application and verified that I am speaking with the correct person using two identifiers.  Patient Location: Home  Provider Location: Office/Clinic  I discussed the limitations of evaluation and management by telemedicine. The patient expressed understanding and agreed to proceed.  Vital Signs: Because this visit was a virtual/telehealth visit, some criteria may be missing or patient reported. Any vitals not documented were not able to be obtained and vitals that have been documented are patient reported.  VideoDeclined- This patient declined Librarian, academic. Therefore the visit was completed with audio only.  Persons Participating in Visit: Patient.  AWV Questionnaire: Yes: Patient Medicare AWV questionnaire was completed by the patient on 12/02/2023; I have confirmed that all information answered by patient is correct and no changes since this date.  Cardiac Risk Factors include: advanced age (>47men, >67 women);hypertension;dyslipidemia;obesity (BMI >30kg/m2)     Objective:     Today's Vitals   12/06/23 1355  Weight: 234 lb (106.1 kg)  Height: 5\' 6"  (1.676 m)   Body mass index is 37.77 kg/m.     12/06/2023    1:52 PM 12/05/2022    2:43 PM 10/17/2022    7:32 AM 08/11/2022    9:41 AM 03/24/2022    3:03 PM 03/24/2022    4:19 AM 02/22/2022    2:50 PM  Advanced Directives  Does Patient Have a Medical Advance Directive? No Yes No No No No No  Type of Furniture conservator/restorer;Living will       Copy of  Healthcare Power of Attorney in Chart?  No - copy requested       Would patient like information on creating a medical advance directive? Yes (MAU/Ambulatory/Procedural Areas - Information given)  No - Patient declined No - Patient declined No - Patient declined No - Patient declined No - Patient declined    Current Medications (verified) Outpatient Encounter Medications as of 12/06/2023  Medication Sig   ascorbic acid (C 500/ROSE HIPS) 500 MG tablet Take 500 mg by mouth daily.   aspirin  EC 81 MG tablet Take 1 tablet (81 mg total) by mouth daily. Swallow whole.   atorvastatin  (LIPITOR) 80 MG tablet Take 1 tablet (80 mg total) by mouth daily.   Calcium  Carb-Cholecalciferol  (CALCIUM  600 + D PO) Take 1 tablet by mouth daily.   cholecalciferol  (VITAMIN D3) 25 MCG (1000 UNIT) tablet Take 1,000 Units by mouth daily.   clonazePAM  (KLONOPIN ) 0.5 MG tablet Take 1 tablet (0.5 mg total) by mouth 2 (two) times daily as needed for anxiety.   ezetimibe  (ZETIA ) 10 MG tablet Take 1 tablet (10 mg total) by mouth daily.   ferrous sulfate  325 (65 FE) MG EC tablet TAKE 1 TABLET BY MOUTH DAILY WITH BREAKFAST. SCHEDULE AN APPOINTMENT FOR FURTHER REFILLS   furosemide  (LASIX ) 40 MG tablet Take 1 tablet (40 mg total) by mouth daily.   hydrALAZINE  (APRESOLINE ) 25 MG tablet Take 1 tablet (25 mg total) by mouth 3 (three) times daily.   Multiple Vitamins-Minerals (PRESERVISION AREDS 2) CAPS Take 1 capsule by mouth  daily.   neomycin-bacitracin-polymyxin (NEOSPORIN) OINT Apply 1 Application topically as needed for wound care.   pantoprazole  (PROTONIX ) 40 MG tablet Take 1 tablet (40 mg total) by mouth daily.   polyethylene glycol (MIRALAX  / GLYCOLAX ) 17 g packet Take 17 g by mouth daily. With coffee   spironolactone  (ALDACTONE ) 25 MG tablet Take 1 tablet (25 mg total) by mouth daily.   traMADol  (ULTRAM ) 50 MG tablet Take 1 tablet (50 mg total) by mouth every 6 (six) hours as needed.   traZODone  (DESYREL ) 150 MG tablet TAKE 1  TABLET BY MOUTH AT BEDTIME.   venlafaxine  XR (EFFEXOR -XR) 150 MG 24 hr capsule Take 1 capsule (150 mg total) by mouth daily with breakfast.   White Petrolatum-Mineral Oil (GENTEAL TEARS NIGHT-TIME) OINT Apply 1 Application to eye at bedtime as needed (dry eyes).   No facility-administered encounter medications on file as of 12/06/2023.    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"   Heart murmur    Hyperlipidemia    Hypertension    Insomnia    Osteoarthritis    PONV (postoperative nausea and vomiting)    2006 - knee surgery   Pre-diabetes    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47   Psoriasis    PTSD (post-traumatic stress disorder)    Pulmonary embolism (HCC) ~ 03/2006   bilaterally   Seasonal allergies    Shortness of breath 06/08/2011   "w/exertion; that's why I'm here"   Sleep apnea    Sleep apnea, obstructive    Umbilical hernia    Past Surgical History:  Procedure Laterality Date   BIOPSY  10/17/2022   Procedure: BIOPSY;  Surgeon: Lindle Rhea, MD;  Location: [facility] ENDOSCOPY;  Service: Gastroenterology;;   [place] Im STUDY  03/24/2022   Procedure: BUBBLE STUDY;  Surgeon: Lindle Rhea, MD;  Location: [facility] ENDOSCOPY;  Service: Gastroenterology;;   CARDIAC CATHETERIZATION     carpel tunnel release Bilateral ~ 06/2005/~ 08/2005   right/left   COLONOSCOPY W/ BIOPSIES AND POLYPECTOMY     COLONOSCOPY WITH PROPOFOL  N/A 08/11/2022   Procedure: COLONOSCOPY WITH PROPOFOL ;  Surgeon: Lindle Rhea, MD;  Location: [facility] ENDOSCOPY;  Service:  Gastroenterology;  Laterality: N/A;   CORONARY ANGIOPLASTY     CORONARY STENT PLACEMENT  02/13/2004   1   ENUCLEATION Right    age 74   ESOPHAGOGASTRODUODENOSCOPY (EGD) WITH PROPOFOL  N/A 03/24/2022   Procedure: ESOPHAGOGASTRODUODENOSCOPY (EGD) WITH PROPOFOL ;  Surgeon: Lindle Rhea, MD;  Location: [facility] ENDOSCOPY;  Service: Gastroenterology;  Laterality: N/A;   ESOPHAGOGASTRODUODENOSCOPY (EGD) WITH PROPOFOL  N/A 08/11/2022   Procedure: ESOPHAGOGASTRODUODENOSCOPY (EGD) WITH PROPOFOL ;  Surgeon: Lindle Rhea, MD;  Location: [facility] ENDOSCOPY;  Service: Gastroenterology;  Laterality: N/A;   ESOPHAGOGASTRODUODENOSCOPY (EGD) WITH PROPOFOL  N/A 10/17/2022   Procedure: ESOPHAGOGASTRODUODENOSCOPY (EGD) WITH PROPOFOL ;  Surgeon: Lindle Rhea, MD;  Location: [facility] ENDOSCOPY;  Service: Gastroenterology;  Laterality: N/A;   EYE SURGERY  age 46; "ruptured  right pupil"   EYE SURGERY     age 1   EYE SURGERY  1957   placed artificial right eye   HEMOSTASIS CONTROL  03/24/2022   Procedure: HEMOSTASIS CONTROL;  Surgeon: Lindle Rhea, MD;  Location: [facility] ENDOSCOPY;  Service: Gastroenterology;;   HOT HEMOSTASIS N/A 03/24/2022   Procedure: HOT HEMOSTASIS (ARGON PLASMA COAGULATION/BICAP);  Surgeon: Lindle Rhea, MD;  Location: [facility] ENDOSCOPY;  Service: Gastroenterology;  Laterality: N/A;   HOT HEMOSTASIS N/A 08/11/2022   Procedure: HOT HEMOSTASIS (ARGON PLASMA COAGULATION/BICAP);  Surgeon: Lindle Rhea, MD;  Location: [facility]OSCOPY;  Service: Gastroenterology;  Laterality: N/A;  stomach   HOT HEMOSTASIS N/A 10/17/2022   Procedure: HOT HEMOSTASIS (ARGON PLASMA COAGULATION/BICAP);  Surgeon: Lindle Rhea, MD;  Location: [facility]OS[facility];  Service: Gastroenterology;  Laterality: N/A;   INGUINAL HERNIA REPAIR Right 1983   INGUINAL HERNIA REPAIR Left 1985   IR IVC FILTER PLMT / S&I /IMG GUID/MOD SED  03/29/2022   KNEE ARTHROSCOPY  ~ 06/2005   right   LEFT HEART CATH AND CORONARY ANGIOGRAPHY N/A 03/25/2022    Procedure: LEFT HEART CATH AND CORONARY ANGIOGRAPHY;  Surgeon: Kyra Phy, MD;  Location: [facility] INVASIVE CV LAB;  Service: Cardiovascular;  Laterality: N/A;   MULTIPLE TOOTH EXTRACTIONS     POLYPECTOMY  08/11/2022   Procedure: POLYPECTOMY;  Surgeon: Lindle Rhea, MD;  Location: [facility] ENDOSCOPY;  Service: Gastroenterology;;  colon   spinal injections  2006-2007   "maybe 10 injections for herniated discs"   TEE WITHOUT CARDIOVERSION  03/24/2022   Procedure: TRANSESOPHAGEAL ECHOCARDIOGRAM (TEE);  Surgeon: Lindle Rhea, MD;  Location: [facility] ENDOSCOPY;  Service: Gastroenterology;;   TONSILLECTOMY     "when I was real young"   TOTAL KNEE ARTHROPLASTY Left 10/09/2015   Procedure: TOTAL KNEE ARTHROPLASTY;  Surgeon: Neil Balls, MD;  Location: [facility] OR;  Service: Orthopedics;  Laterality: Left;   TOTAL KNEE ARTHROPLASTY Right 04/04/2016   Procedure: TOTAL KNEE ARTHROPLASTY;  Surgeon: Neil Balls, MD;  Location: [facility] OR;  Service: Orthopedics;  Laterality: Right;   Family History  Problem Relation Age of Onset   Asthma Father    Emphysema Father    Breast cancer Sister    Liver cancer Sister    Asthma Sister    Asthma Brother    Colon cancer Neg Hx    Esophageal cancer Neg Hx    Colon polyps Neg Hx    Rectal cancer Neg Hx    Stomach cancer Neg Hx    Social History   Socioeconomic History   Marital status: Married    Spouse name: Not on file   Number of children: 3   Years of education: Not on file   Highest education level: GED or equivalent  Occupational History   Occupation: building maintenance    Comment: disabled  Tobacco Use   Smoking status: Former    Current packs/day: 0.00    Average packs/day: 1.5 packs/day for 25.0 years (37.5 ttl pk-yrs)    Types: Cigarettes    Start date: 07/09/1970    Quit date: 07/10/1995    Years since quitting: 28.4   Smokeless tobacco: Never  Vaping Use   Vaping status: Never Used  Substance and Sexual Activity   Alcohol use: No   Drug use: No    Sexual activity: Yes    Partners: Female    Comment: married  Other Topics Concern   Not on file  Social History Narrative   Former Education administrator, 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   Recent stress, currently undergoing financial difficulties.   Social Drivers of Corporate investment banker Strain: Low Risk  (12/06/2023)   Overall Financial Resource Strain (CARDIA)    Difficulty of Paying Living Expenses: Not very hard  Food Insecurity: No Food Insecurity (12/06/2023)   Hunger Vital Sign    Worried About Running Out of Food in the Last Year: Never true    Ran Out of Food in the Last Year: Never true  Transportation Needs: No Transportation Needs (12/06/2023)   PRAPARE - Administrator, Civil Service (Medical): No    Lack of Transportation (Non-Medical): No  Physical Activity: Inactive (12/06/2023)   Exercise Vital Sign    Days of Exercise per Week: 0 days    Minutes of Exercise per Session: 0 min  Stress: No Stress Concern Present (12/06/2023)   [employer] of Occupational Health - Occupational Stress Questionnaire    Feeling of Stress : Only a little  Social Connections: Moderately Isolated (12/06/2023)   Social Connection and Isolation Panel [NHANES]    Frequency of Communication with Friends and Family: More than three times a week    Frequency of Social Gatherings with Friends and Family: Never    Attends Religious Services: Never    Database administrator or Organizations: No    Attends Engineer, structural: Never    Marital Status: Married    Tobacco Counseling Counseling given: No    Clinical Intake:  Pre-visit preparation completed: Yes  Pain : No/denies pain     BMI - recorded: 37.77 Nutritional Status: BMI > 30  Obese Nutritional Risks: None Diabetes: No  Lab Results  Component Value Date   HGBA1C 4.3 (L) 03/25/2022   HGBA1C 5.8 08/10/2021   HGBA1C 5.9 (A) 12/17/2020     How often do you need to have someone help you when  you read instructions, pamphlets, or other written materials from your doctor or pharmacy?: 1 - Never  Interpreter Needed?: No  Information entered by :: Kandy Orris, CMA   Activities of Daily Living     12/06/2023    1:59 PM 12/02/2023    1:40 PM  In your present state of health, do you have any difficulty performing the following activities:  Hearing? 0 0  Vision? 0 0  Difficulty concentrating or making decisions? 0 0  Walking or climbing stairs? 1 1  Comment bilateral knee discomfort   Dressing or bathing? 0 0  Doing errands, shopping? 0 0  Preparing Food and eating ? N N  Using the Toilet? N N  In the past six months, have you accidently leaked urine? N N  Do you have problems with loss of bowel control? N N  Managing your Medications? N N  Managing your Finances? N N  Housekeeping or managing your Housekeeping? Y Y  Comment bilateral knee discomfort when cleaning     Patient Care Team: [name], MD as PCP - General (Internal Medicine) [place], Peter M, MD as PCP - Cardiology (Cardiology) Tami Falcon, MD as Consulting Physician (Gastroenterology) Duke, Warren Haber, PA as Physician Assistant (Cardiology) Devin Foerster, MD as Consulting Physician (Ophthalmology) Neil Balls, MD as Consulting Physician (Orthopedic Surgery)  I have updated your Care Teams any recent Medical Services you may have received from other providers in the past year.     Assessment:    This is a routine wellness examination for [facility].  Hearing/Vision screen  Hearing Screening - Comments:: Denies hearing difficulties   Vision Screening - Comments:: Wears rx glasses - up to date with routine eye exams with Dr Candi Chafe   Goals Addressed               This Visit's Progress     Patient Stated (pt-stated)        Patient stated he's watching his diet and watch weight       Depression Screen     12/06/2023    2:01 PM 12/05/2022    2:39 PM 02/09/2022    1:56 PM 10/29/2021    11:42 AM 08/10/2021    2:40 PM 10/12/2020    4:30 PM 05/18/2018    2:53 PM  PHQ 2/9 Scores  PHQ - 2 Score 0 0 0 0 2 0 0  PHQ- 9 Score 3 0 2    5    Fall Risk     12/06/2023    2:01 PM 12/02/2023    1:40 PM 12/05/2022    2:43 PM 12/02/2022    1:49 PM 02/09/2022    1:56 PM  Fall Risk   Falls in the past year? 0 0 1 1 0  Number falls in past yr: 0 0 0 0 0  Injury with Fall? 0 0 1 1 0  Risk for fall due to : No Fall Risks  History of fall(s);Impaired balance/gait;Orthopedic patient  No Fall Risks  Follow up Falls evaluation completed;Falls prevention discussed  Education provided;Falls prevention discussed  Falls evaluation completed    MEDICARE RISK AT HOME:  Medicare Risk at Home Any stairs in or around the home?: No If so, are there any without handrails?: Yes Home free of loose throw rugs in walkways, pet beds, electrical cords, etc?: Yes Adequate lighting in your home to reduce risk of falls?: Yes Life alert?: No Use of a cane, walker or w/c?: Yes Grab bars in the bathroom?: Yes Shower chair or bench in shower?: No Elevated toilet seat or a handicapped toilet?: No  TIMED UP AND GO:  Was the test performed?  No  Cognitive Function: 6CIT completed        12/06/2023    2:05 PM 12/05/2022    2:45 PM  6CIT Screen  What Year? 0 points 0 points  What month? 0 points 0 points  What time? 0 points 0 points  Count back from 20 0 points 0 points  Months in reverse 0 points 0 points  Repeat phrase 0 points 0 points  Total Score 0 points 0 points    Immunizations Immunization History  Administered Date(s) Administered   Fluad Quad(high Dose 65+) 03/29/2020   Influenza Split 05/05/2011, 04/03/2013   Influenza Whole 04/30/2012   Influenza, High Dose Seasonal PF 08/10/2015, 03/08/2016, 04/20/2017, 04/24/2018, 03/26/2019, 06/03/2021   Influenza-Unspecified 03/26/2019, 06/03/2021   PFIZER Comirnaty(Gray Top)Covid-19 Tri-Sucrose Vaccine 10/19/2020   PFIZER(Purple Top)SARS-COV-2  Vaccination 08/24/2019, 09/17/2019, 03/29/2020   Pfizer Covid-19 Vaccine Bivalent Booster 56yrs & up 06/03/2021   Pneumococcal Conjugate-13 02/11/2015   Pneumococcal Polysaccharide-23 06/09/2011, 12/17/2020   Td 02/11/2015   Zoster Recombinant(Shingrix ) 05/26/2023    Screening Tests Health Maintenance  Topic Date Due   COVID-19 Vaccine (6 - 2024-25 season) 03/05/2023   Zoster Vaccines- Shingrix  (2 of 2) 07/21/2023   INFLUENZA VACCINE  02/02/2024   Medicare Annual Wellness (AWV)  12/05/2024   DTaP/Tdap/Td (2 - Tdap) 02/10/2025   Pneumonia Vaccine 52+ Years old  Completed   Hepatitis C Screening  Completed  HPV VACCINES  Aged Out   Meningococcal B Vaccine  Aged Out   Colonoscopy  Discontinued    Health Maintenance  Health Maintenance Due  Topic Date Due   COVID-19 Vaccine (6 - 2024-25 season) 03/05/2023   Zoster Vaccines- Shingrix  (2 of 2) 07/21/2023   Health Maintenance Items Addressed: 12/06/2023   Additional Screening:  Vision Screening: Recommended annual ophthalmology exams for early detection of glaucoma and other disorders of the eye. Pt stated has seen Dr Candi Chafe in 2025 for eye exam.  Dental Screening: Recommended annual dental exams for proper oral hygiene  Community Resource Referral / Chronic Care Management: CRR required this visit?  No   CCM required this visit?  No   Plan:    I have personally reviewed and noted the following in the patient's chart:   Medical and social history Use of alcohol, tobacco or illicit drugs  Current medications and supplements including opioid prescriptions. Patient is currently taking opioid prescriptions. Information provided to patient regarding non-opioid alternatives. Patient advised to discuss non-opioid treatment plan with their provider. Functional ability and status Nutritional status Physical activity Advanced directives List of other physicians Hospitalizations, surgeries, and ER visits in previous 12  months Vitals Screenings to include cognitive, depression, and falls Referrals and appointments  In addition, I have reviewed and discussed with patient certain preventive protocols, quality metrics, and best practice recommendations. A written personalized care plan for preventive services as well as general preventive health recommendations were provided to patient.   Patria Bookbinder, CMA   12/06/2023   After Visit Summary: (MyChart) Due to this being a telephonic visit, the after visit summary with patients personalized plan was offered to patient via MyChart   Notes: Nothing significant to report at this time.

## 2023-12-11 ENCOUNTER — Telehealth: Payer: Self-pay | Admitting: Primary Care

## 2023-12-11 ENCOUNTER — Ambulatory Visit: Admitting: Podiatry

## 2023-12-11 ENCOUNTER — Encounter: Payer: Self-pay | Admitting: Podiatry

## 2023-12-11 DIAGNOSIS — B351 Tinea unguium: Secondary | ICD-10-CM | POA: Diagnosis not present

## 2023-12-11 DIAGNOSIS — M79671 Pain in right foot: Secondary | ICD-10-CM | POA: Diagnosis not present

## 2023-12-11 DIAGNOSIS — M79672 Pain in left foot: Secondary | ICD-10-CM | POA: Diagnosis not present

## 2023-12-11 NOTE — Progress Notes (Signed)
 Patient presents for evaluation and treatment of tenderness and some redness around nails feet.  Tenderness around toes with walking and wearing shoes.  Physical exam:  General appearance: Alert, pleasant, and in no acute distress.  Vascular: Pedal pulses: DP 2/4 B/L, PT 0/4 B/L.  Moderate edema lower legs bilateral  Neurological:  No paresthesias or burning noted  Dermatologic:  Nails thickened, disfigured, discolored 1-5 BL with subungual debris.  Redness and hypertrophic nail folds along nail folds bilaterally but no signs of drainage or infection.  Musculoskeletal:  Hammertoes 2 through 5 bilaterally   Diagnosis: 1. Painful onychomycotic nails 1 through 5 bilaterally. 2. Pain toes 1 through 5 bilaterally.  Plan: Debrided onychomycotic nails 1 through 5 bilaterally.  Return 3 months

## 2023-12-11 NOTE — Telephone Encounter (Signed)
 CMN received from [facility] for CPAP

## 2023-12-12 DIAGNOSIS — M791 Myalgia, unspecified site: Secondary | ICD-10-CM | POA: Diagnosis not present

## 2023-12-12 DIAGNOSIS — M545 Low back pain, unspecified: Secondary | ICD-10-CM | POA: Diagnosis not present

## 2023-12-14 NOTE — Progress Notes (Signed)
 Cardiology Office Note:    Date:  12/18/2023   ID:  AZIM GILLINGHAM, DOB 1945/10/25, MRN [id number]  PCP:  [name], MD   [facility] Providers Cardiologist:  Kariss Longmire [place], MD Cardiology APP:  [place]amond [place], [place]     Referring MD: [name], MD   Chief Complaint  Patient presents with   Coronary Artery Disease   Dizziness    History of Present Illness:    Aaron [name] is a 78 y.o. male seen for follow up edema. 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  He has a history of stenting to his mid LAD in 2005 following a nuclear stress test in that showed evidence of anterior apical ischemia.  Follow-up Myoview  in 2014 was normal.  He was anticoagulated on Xarelto  for recurrent PE/DVT, but with a history of chronic IDA requiring transfusions.  Hypertension was managed by his PCP. He was recently hospitalized 03/2022 with an in-hospital Vfib arrest in the setting of GI bleed and Hb of 4.8. Initial EKG with ST depressions. LHC revealed nonobstructive CAD, no intervention. Telemetry with sinus rhythm. Suspected demand  ischemia in the setting of severe anemia. However, due to risk of re-bleed, he was discharged with a lifevest.  GI workup notable for EGD showing gastric antral vascular ectasia treated with APC, felt to be extremely high risk for re-bleed. GI recommended complete discontinuation of OAC. Therefore, Xarelto  held and IVC filter was placed by IR. His last EGD on April 14 showed multiple gastric polyps and GAVE treated again with APC.   On follow up today he is doing well. Notes a little lightheadedness with standing. Thinks his LE edema is stable. Weight is down. No chest pain or dyspnea. Sold his house and now living in apartment.      Past Medical History:  Diagnosis Date   Anemia    @ birth   Anxiety    Asthma    Blood transfusion    @ birth   Cancer 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47   Psoriasis    PTSD (post-traumatic stress disorder)    Pulmonary embolism (HCC) ~ 03/2006   bilaterally   Seasonal allergies    Shortness of breath 06/08/2011   w/exertion; that's why I'm here   Sleep apnea    Sleep apnea, obstructive    Umbilical hernia     Past Surgical History:  Procedure Laterality Date   BIOPSY  10/17/2022   Procedure: BIOPSY;  Surgeon: Lindle Rhea, MD;  Location: [facility]COPY;  Service: Gastroenterology;;   [place] Im STUDY  03/24/2022   Procedure: BUBBLE STUDY;  Surgeon: Lindle Rhea, MD;  Location: [facility] ENDOSCOPY;  Service: Gastroenterology;;   CARDIAC CATHETERIZATION     carpel tunnel release Bilateral ~ 06/2005/~ 08/2005  right/left   COLONOSCOPY W/ BIOPSIES AND POLYPECTOMY     COLONOSCOPY WITH PROPOFOL  N/A 08/11/2022   Procedure: COLONOSCOPY WITH PROPOFOL ;  Surgeon: Lindle Rhea, MD;  Location: [facility] ENDOSCOPY;  Service: Gastroenterology;  Laterality: N/A;   CORONARY ANGIOPLASTY     CORONARY STENT PLACEMENT  02/13/2004   1   ENUCLEATION Right    age 36   ESOPHAGOGASTRODUODENOSCOPY (EGD) WITH PROPOFOL  N/A 03/24/2022   Procedure: ESOPHAGOGASTRODUODENOSCOPY (EGD) WITH PROPOFOL ;  Surgeon: Lindle Rhea, MD;  Location: [facility] ENDOSCOPY;  Service: Gastroenterology;  Laterality: N/A;   ESOPHAGOGASTRODUODENOSCOPY (EGD) WITH PROPOFOL  N/A 08/11/2022   Procedure: ESOPHAGOGASTRODUODENOSCOPY (EGD) WITH PROPOFOL ;  Surgeon: Lindle Rhea, MD;  Location: [facility] ENDOSCOPY;  Service: Gastroenterology;   Laterality: N/A;   ESOPHAGOGASTRODUODENOSCOPY (EGD) WITH PROPOFOL  N/A 10/17/2022   Procedure: ESOPHAGOGASTRODUODENOSCOPY (EGD) WITH PROPOFOL ;  Surgeon: Lindle Rhea, MD;  Location: [facility] ENDOSCOPY;  Service: Gastroenterology;  Laterality: N/A;   EYE SURGERY     age 51; ruptured  right pupil   EYE SURGERY     age 64   EYE SURGERY  1957   placed artificial right eye   HEMOSTASIS CONTROL  03/24/2022   Procedure: HEMOSTASIS CONTROL;  Surgeon: Lindle Rhea, MD;  Location: [facility] ENDOSCOPY;  Service: Gastroenterology;;   HOT HEMOSTASIS N/A 03/24/2022   Procedure: HOT HEMOSTASIS (ARGON PLASMA COAGULATION/BICAP);  Surgeon: Lindle Rhea, MD;  Location: [facility] ENDOSCOPY;  Service: Gastroenterology;  Laterality: N/A;   HOT HEMOSTASIS N/A 08/11/2022   Procedure: HOT HEMOSTASIS (ARGON PLASMA COAGULATION/BICAP);  Surgeon: Lindle Rhea, MD;  Location: [facility]OSCOPY;  Service: Gastroenterology;  Laterality: N/A;  stomach   HOT HEMOSTASIS N/A 10/17/2022   Procedure: HOT HEMOSTASIS (ARGON PLASMA COAGULATION/BICAP);  Surgeon: Lindle Rhea, MD;  Location: [facility]OSCOPY;  Service: Gastroenterology;  Laterality: N/A;   INGUINAL HERNIA REPAIR Right 1983   INGUINAL HERNIA REPAIR Left 1985   IR IVC FILTER PLMT / S&I /IMG GUID/MOD SED  03/29/2022   KNEE ARTHROSCOPY  ~ 06/2005   right   LEFT HEART CATH AND CORONARY ANGIOGRAPHY N/A 03/25/2022   Procedure: LEFT HEART CATH AND CORONARY ANGIOGRAPHY;  Surgeon: Kyra Phy, MD;  Location: [facility] INVASIVE CV LAB;  Service: Cardiovascular;  Laterality: N/A;   MULTIPLE TOOTH EXTRACTIONS     POLYPECTOMY  08/11/2022   Procedure: POLYPECTOMY;  Surgeon: Lindle Rhea, MD;  Location: [facility] ENDOSCOPY;  Service: Gastroenterology;;  colon   spinal injections  2006-2007   maybe 10 injections for herniated discs   TEE WITHOUT CARDIOVERSION  03/24/2022   Procedure: TRANSESOPHAGEAL ECHOCARDIOGRAM (TEE);  Surgeon: Lindle Rhea, MD;  Location: [facility] ENDOSCOPY;  Service:  Gastroenterology;;   TONSILLECTOMY     when I was real young   TOTAL KNEE ARTHROPLASTY Left 10/09/2015   Procedure: TOTAL KNEE ARTHROPLASTY;  Surgeon: Neil Balls, MD;  Location: [facility] OR;  Service: Orthopedics;  Laterality: Left;   TOTAL KNEE ARTHROPLASTY Right 04/04/2016   Procedure: TOTAL KNEE ARTHROPLASTY;  Surgeon: Neil Balls, MD;  Location: [facility] OR;  Service: Orthopedics;  Laterality: Right;    Current Medications: Current Meds  Medication Sig   ascorbic acid (C 500/ROSE HIPS) 500 MG tablet Take 500 mg by mouth daily.   aspirin  EC 81 MG tablet Take 1 tablet (81 mg total) by mouth daily. Swallow whole.   atorvastatin  (LIPITOR) 80 MG tablet Take 1 tablet (80 mg total) by mouth daily.   Calcium  Carb-Cholecalciferol  (CALCIUM  600 + D PO) Take 1 tablet by mouth daily.   cholecalciferol  (VITAMIN D3) 25 MCG (1000 UNIT) tablet Take  1,000 Units by mouth daily.   clonazePAM  (KLONOPIN ) 0.5 MG tablet Take 1 tablet (0.5 mg total) by mouth 2 (two) times daily as needed for anxiety.   ezetimibe  (ZETIA ) 10 MG tablet Take 1 tablet (10 mg total) by mouth daily.   ferrous sulfate  325 (65 FE) MG EC tablet TAKE 1 TABLET BY MOUTH DAILY WITH BREAKFAST. SCHEDULE AN APPOINTMENT FOR FURTHER REFILLS   furosemide  (LASIX ) 40 MG tablet Take 1 tablet (40 mg total) by mouth daily.   hydrALAZINE  (APRESOLINE ) 25 MG tablet Take 1 tablet (25 mg total) by mouth 3 (three) times daily.   Multiple Vitamins-Minerals (PRESERVISION AREDS 2) CAPS Take 1 capsule by mouth daily.   neomycin-bacitracin-polymyxin (NEOSPORIN) OINT Apply 1 Application topically as needed for wound care.   pantoprazole  (PROTONIX ) 40 MG tablet Take 1 tablet (40 mg total) by mouth daily.   polyethylene glycol (MIRALAX  / GLYCOLAX ) 17 g packet Take 17 g by mouth daily. With coffee   spironolactone  (ALDACTONE ) 25 MG tablet Take 1 tablet (25 mg total) by mouth daily.   traMADol  (ULTRAM ) 50 MG tablet Take 1 tablet (50 mg total) by mouth every 6 (six) hours as  needed.   traZODone  (DESYREL ) 150 MG tablet TAKE 1 TABLET BY MOUTH AT BEDTIME.   venlafaxine  XR (EFFEXOR -XR) 150 MG 24 hr capsule Take 1 capsule (150 mg total) by mouth daily with breakfast.   White Petrolatum-Mineral Oil (GENTEAL TEARS NIGHT-TIME) OINT Apply 1 Application to eye at bedtime as needed (dry eyes).     Allergies:   Wellbutrin [bupropion] and Amoxicillin   Social History   Socioeconomic History   Marital status: Married    Spouse name: Not on file   Number of children: 3   Years of education: Not on file   Highest education level: GED or equivalent  Occupational History   Occupation: building maintenance    Comment: disabled  Tobacco Use   Smoking status: Former    Current packs/day: 0.00    Average packs/day: 1.5 packs/day for 25.0 years (37.5 ttl pk-yrs)    Types: Cigarettes    Start date: 07/09/1970    Quit date: 07/10/1995    Years since quitting: 28.4   Smokeless tobacco: Never  Vaping Use   Vaping status: Never Used  Substance and Sexual Activity   Alcohol use: No   Drug use: No   Sexual activity: Yes    Partners: Female    Comment: married  Other Topics Concern   Not on file  Social History Narrative   Former Education administrator, 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   Recent stress, currently undergoing financial difficulties.   Social Drivers of Corporate investment banker Strain: Low Risk  (12/16/2023)   Overall Financial Resource Strain (CARDIA)    Difficulty of Paying Living Expenses: Not hard at all  Food Insecurity: No Food Insecurity (12/16/2023)   Hunger Vital Sign    Worried About Running Out of Food in the Last Year: Never true    Ran Out of Food in the Last Year: Never true  Transportation Needs: No Transportation Needs (12/16/2023)   PRAPARE - Administrator, Civil Service (Medical): No    Lack of Transportation (Non-Medical): No  Physical Activity: Insufficiently Active (12/16/2023)   Exercise Vital Sign    Days of Exercise per  Week: 1 day    Minutes of Exercise per Session: 20 min  Stress: No Stress Concern Present (12/16/2023)   [employer] of Occupational  Health - Occupational Stress Questionnaire    Feeling of Stress: Not at all  Social Connections: Socially Isolated (12/16/2023)   Social Connection and Isolation Panel    Frequency of Communication with Friends and Family: Once a week    Frequency of Social Gatherings with Friends and Family: Never    Attends Religious Services: Never    Diplomatic Services operational officer: No    Attends Engineer, structural: Not on file    Marital Status: Married     Family History: The patient's family history includes Asthma in his brother, father, and sister; Breast cancer in his sister; Emphysema in his father; Liver cancer in his sister. There is no history of Colon cancer, Esophageal cancer, Colon polyps, Rectal cancer, or Stomach cancer.  ROS:   Please see the history of present illness.     All other systems reviewed and are negative.  EKGs/Labs/Other Studies Reviewed:    The following studies were reviewed today:  Left heart cath 03/25/22:   Dist LAD lesion is 10% stenosed.   1st Mrg lesion is 30% stenosed.   1.  Diffuse mild to moderate obstructive disease with patent mid LAD stent. 2.  Elevated LVEDP of 25 mmHg.   Commendation: Continued medical therapy.     Echo 03/26/22:  1. Study not well visualized to full assess endocardial borders. Left  ventricular ejection fraction, by estimation, is 55 to 60%. The left  ventricle has normal function. The left ventricle has no regional wall  motion abnormalities. There is mild left  ventricular hypertrophy. Left ventricular diastolic parameters were  normal.   2. Right ventricular systolic function is normal. The right ventricular  size is normal. There is normal pulmonary artery systolic pressure.   3. The mitral valve is normal in structure. No evidence of mitral valve  regurgitation.    4. The aortic valve was not well visualized. Aortic valve regurgitation  is not visualized.   5. The inferior vena cava is normal in size with greater than 50%  respiratory variability, suggesting right atrial pressure of 3 mmHg.      TEE 03/24/22: See external report  EKG:  EKG is not ordered today.    Recent Labs: 05/16/2023: BUN 22; Creatinine, Ser 1.14; Potassium 4.3; Sodium 140; TSH 2.65 11/09/2023: Hemoglobin 11.6; Platelet Count 196  Recent Lipid Panel    Component Value Date/Time   CHOL 128 05/16/2023 1452   CHOL 151 12/17/2020 1017   TRIG 179.0 (H) 05/16/2023 1452   HDL 43.90 05/16/2023 1452   HDL 40 12/17/2020 1017   CHOLHDL 3 05/16/2023 1452   VLDL 35.8 05/16/2023 1452   LDLCALC 49 05/16/2023 1452   LDLCALC 89 12/17/2020 1017   LDLDIRECT 62.0 08/10/2021 1520     Risk Assessment/Calculations:               Physical Exam:    VS:  BP (!) 147/70 (BP Location: Left Arm, Patient Position: Sitting, Cuff Size: Large)   Pulse 74   Ht 5' 6 (1.676 m)   Wt 231 lb (104.8 kg)   SpO2 96%   BMI 37.28 kg/m     Wt Readings from Last 3 Encounters:  12/18/23 231 lb (104.8 kg)  12/06/23 234 lb (106.1 kg)  11/29/23 234 lb (106.1 kg)     GEN:  Well nourished, well developed in no acute distress HEENT: Normal NECK: No JVD; No carotid bruits LYMPHATICS: No lymphadenopathy CARDIAC: RRR, no murmurs, rubs, gallops RESPIRATORY:  Clear to auscultation without rales, wheezing or rhonchi  ABDOMEN: Soft, non-tender, non-distended MUSCULOSKELETAL: 1+ edema at ankles; No deformity  SKIN: Warm and dry NEUROLOGIC:  Alert and oriented x 3 PSYCHIATRIC:  Normal affect   ASSESSMENT:    1. Chronic diastolic heart failure (HCC)   2. CAD S/P percutaneous coronary angioplasty   3. Essential hypertension   4. Pure hypercholesterolemia       PLAN:    In order of problems listed above:  Lower extremity edema. Suspect a component of venous insufficiency with prior bilateral TKR  and prior DVT. Could have some diastolic CHF as well. Recommend continuing current lasix  and aldactone  dose. Discussed importance of low sodium diet, foot elevation at rest and wearing compression hose.    2. NSTEMI - demand ischemia- also in setting of profound anemia CAD s/p old LAD PCI. Cardiac cath showed patent stent with mild nonobstructive CAD Medical management.    3. Chronic diastolic heart failure- no evidence of volume overload.  Echo with preserved EF and normal RV On lasix  and aldactone . If swelling worsens would consider resuming Jardiance .    4. HTN controlled.   Follow up in 6 months.       Medication Adjustments/Labs and Tests Ordered: Current medicines are reviewed at length with the patient today.  Concerns regarding medicines are outlined above.  No orders of the defined types were placed in this encounter.  No orders of the defined types were placed in this encounter.   There are no Patient Instructions on file for this visit.   Signed, Benaiah Behan [place], MD  12/18/2023 2:23 PM    [facility]

## 2023-12-14 NOTE — Telephone Encounter (Signed)
 CMN signed, fax conformation attached, and sent to scan.

## 2023-12-18 ENCOUNTER — Encounter: Payer: Self-pay | Admitting: Cardiology

## 2023-12-18 ENCOUNTER — Ambulatory Visit: Payer: Medicare Other | Attending: Cardiology | Admitting: Cardiology

## 2023-12-18 ENCOUNTER — Other Ambulatory Visit: Payer: Self-pay | Admitting: Internal Medicine

## 2023-12-18 VITALS — BP 147/70 | HR 74 | Ht 66.0 in | Wt 231.0 lb

## 2023-12-18 DIAGNOSIS — I5032 Chronic diastolic (congestive) heart failure: Secondary | ICD-10-CM

## 2023-12-18 DIAGNOSIS — I2510 Atherosclerotic heart disease of native coronary artery without angina pectoris: Secondary | ICD-10-CM | POA: Diagnosis not present

## 2023-12-18 DIAGNOSIS — G4700 Insomnia, unspecified: Secondary | ICD-10-CM

## 2023-12-18 DIAGNOSIS — I10 Essential (primary) hypertension: Secondary | ICD-10-CM | POA: Diagnosis not present

## 2023-12-18 DIAGNOSIS — Z9861 Coronary angioplasty status: Secondary | ICD-10-CM

## 2023-12-18 DIAGNOSIS — F411 Generalized anxiety disorder: Secondary | ICD-10-CM

## 2023-12-18 DIAGNOSIS — E7800 Pure hypercholesterolemia, unspecified: Secondary | ICD-10-CM | POA: Diagnosis not present

## 2023-12-18 NOTE — Patient Instructions (Signed)
 Medication Instructions:  Continue same medications *If you need a refill on your cardiac medications before your next appointment, please call your pharmacy*  Lab Work:  None ordered  Testing/Procedures: None ordered  Follow-Up: At [facility], you and your health needs are our priority.  As part of our continuing mission to provide you with exceptional heart care, our providers are all part of one team.  This team includes your primary Cardiologist (physician) and Advanced Practice Providers or APPs (Physician Assistants and Nurse Practitioners) who all work together to provide you with the care you need, when you need it.  Your next appointment:  6 months   Call in August to schedule Dec appointment     Provider:  Dr.Jordan   We recommend signing up for the patient portal called MyChart.  Sign up information is provided on this After Visit Summary.  MyChart is used to connect with patients for Virtual Visits (Telemedicine).  Patients are able to view lab/test results, encounter notes, upcoming appointments, etc.  Non-urgent messages can be sent to your provider as well.   To learn more about what you can do with MyChart, go to [email or web address].

## 2023-12-19 ENCOUNTER — Encounter: Payer: Self-pay | Admitting: Internal Medicine

## 2023-12-19 ENCOUNTER — Ambulatory Visit (INDEPENDENT_AMBULATORY_CARE_PROVIDER_SITE_OTHER): Admitting: Internal Medicine

## 2023-12-19 VITALS — BP 124/58 | HR 75 | Temp 98.3°F | Resp 16 | Ht 66.0 in | Wt 229.2 lb

## 2023-12-19 DIAGNOSIS — E785 Hyperlipidemia, unspecified: Secondary | ICD-10-CM

## 2023-12-19 DIAGNOSIS — I2510 Atherosclerotic heart disease of native coronary artery without angina pectoris: Secondary | ICD-10-CM

## 2023-12-19 DIAGNOSIS — M545 Low back pain, unspecified: Secondary | ICD-10-CM | POA: Diagnosis not present

## 2023-12-19 DIAGNOSIS — D500 Iron deficiency anemia secondary to blood loss (chronic): Secondary | ICD-10-CM

## 2023-12-19 DIAGNOSIS — G8929 Other chronic pain: Secondary | ICD-10-CM | POA: Diagnosis not present

## 2023-12-19 DIAGNOSIS — M170 Bilateral primary osteoarthritis of knee: Secondary | ICD-10-CM | POA: Diagnosis not present

## 2023-12-19 DIAGNOSIS — R739 Hyperglycemia, unspecified: Secondary | ICD-10-CM | POA: Diagnosis not present

## 2023-12-19 DIAGNOSIS — I10 Essential (primary) hypertension: Secondary | ICD-10-CM | POA: Diagnosis not present

## 2023-12-19 LAB — HEPATIC FUNCTION PANEL
ALT: 25 U/L (ref 0–53)
AST: 15 U/L (ref 0–37)
Albumin: 4.1 g/dL (ref 3.5–5.2)
Alkaline Phosphatase: 85 U/L (ref 39–117)
Bilirubin, Direct: 0.1 mg/dL (ref 0.0–0.3)
Total Bilirubin: 0.6 mg/dL (ref 0.2–1.2)
Total Protein: 6.9 g/dL (ref 6.0–8.3)

## 2023-12-19 LAB — CBC WITH DIFFERENTIAL/PLATELET
Basophils Absolute: 0.1 10*3/uL (ref 0.0–0.1)
Basophils Relative: 0.7 % (ref 0.0–3.0)
Eosinophils Absolute: 0.2 10*3/uL (ref 0.0–0.7)
Eosinophils Relative: 2.8 % (ref 0.0–5.0)
HCT: 35.5 % — ABNORMAL LOW (ref 39.0–52.0)
Hemoglobin: 12.1 g/dL — ABNORMAL LOW (ref 13.0–17.0)
Lymphocytes Relative: 15.7 % (ref 12.0–46.0)
Lymphs Abs: 1.3 10*3/uL (ref 0.7–4.0)
MCHC: 34.1 g/dL (ref 30.0–36.0)
MCV: 86.9 fl (ref 78.0–100.0)
Monocytes Absolute: 1 10*3/uL (ref 0.1–1.0)
Monocytes Relative: 11.8 % (ref 3.0–12.0)
Neutro Abs: 5.6 10*3/uL (ref 1.4–7.7)
Neutrophils Relative %: 69 % (ref 43.0–77.0)
Platelets: 253 10*3/uL (ref 150.0–400.0)
RBC: 4.08 Mil/uL — ABNORMAL LOW (ref 4.22–5.81)
RDW: 13.3 % (ref 11.5–15.5)
WBC: 8.2 10*3/uL (ref 4.0–10.5)

## 2023-12-19 LAB — BASIC METABOLIC PANEL WITH GFR
BUN: 27 mg/dL — ABNORMAL HIGH (ref 6–23)
CO2: 31 meq/L (ref 19–32)
Calcium: 9.2 mg/dL (ref 8.4–10.5)
Chloride: 102 meq/L (ref 96–112)
Creatinine, Ser: 1.14 mg/dL (ref 0.40–1.50)
GFR: 61.8 mL/min (ref >60.00)
Glucose, Bld: 89 mg/dL (ref 70–99)
Potassium: 4.3 meq/L (ref 3.5–5.1)
Sodium: 139 meq/L (ref 135–145)

## 2023-12-19 LAB — HEMOGLOBIN A1C: Hgb A1c MFr Bld: 6.2 % (ref 4.6–6.5)

## 2023-12-19 MED ORDER — TRAMADOL HCL 50 MG PO TABS: 100.0000 mg | ORAL_TABLET | Freq: Three times a day (TID) | ORAL | 1 refills | Status: DC

## 2023-12-19 NOTE — Progress Notes (Unsigned)
 Subjective:  Patient ID: Aaron [name], male    DOB: 1946/04/03  Age: 78 y.o. MRN: [id number]  CC: Medical Management of Chronic Issues (6 month follow up. Patient wants to discuss his tramadol  medication )   HPI Aaron [name] presents for f/up ----  Discussed the use of AI scribe software for clinical note transcription with the patient, who gave verbal consent to proceed.  History of Present Illness   Aaron [name] is a 78 year old male who presents with knee pain and medication management.  He experiences significant knee pain, especially during physical activities like hiking. He underwent bilateral knee replacement surgery in 2017. Recently, he received two injections in his knees, which provided substantial relief after two to three days. He plans to follow up with his orthopedic specialist next month.  He experiences lightheadedness, particularly when standing up quickly. He mentioned this symptom to his cardiologist, who did not perform an EKG. He cannot recall the last time he had an EKG, estimating it was about a year ago. No chest pain, shortness of breath, dizziness, or trouble breathing.  He is currently managing his pain with tramadol . His last prescription was for one tablet every six hours, totaling four tablets per day. Previously, he was taking two tablets every eight hours. He believes the change in dosing has contributed to increased pain over the last few months.  He notes that his blood pressure was lower than usual during this visit, though he has experienced similar readings in the past. He also mentions that he is not wearing socks due to tightness and plans to purchase more comfortable ones.       Outpatient Medications Prior to Visit  Medication Sig Dispense Refill   ascorbic acid (C 500/ROSE HIPS) 500 MG tablet Take 500 mg by mouth daily.     aspirin  EC 81 MG tablet Take 1 tablet (81 mg total) by mouth daily. Swallow whole. 30 tablet 0   atorvastatin   (LIPITOR) 80 MG tablet Take 1 tablet (80 mg total) by mouth daily. 90 tablet 3   Calcium  Carb-Cholecalciferol  (CALCIUM  600 + D PO) Take 1 tablet by mouth daily.     cholecalciferol  (VITAMIN D3) 25 MCG (1000 UNIT) tablet Take 1,000 Units by mouth daily.     clonazePAM  (KLONOPIN ) 0.5 MG tablet TAKE 1 TABLET BY MOUTH 2 TIMES DAILY AS NEEDED FOR ANXIETY. 180 tablet 0   ezetimibe  (ZETIA ) 10 MG tablet Take 1 tablet (10 mg total) by mouth daily. 100 tablet 2   ferrous sulfate  325 (65 FE) MG EC tablet TAKE 1 TABLET BY MOUTH DAILY WITH BREAKFAST. SCHEDULE AN APPOINTMENT FOR FURTHER REFILLS 90 tablet 3   furosemide  (LASIX ) 40 MG tablet Take 1 tablet (40 mg total) by mouth daily. 90 tablet 3   hydrALAZINE  (APRESOLINE ) 25 MG tablet Take 1 tablet (25 mg total) by mouth 3 (three) times daily. 270 tablet 3   Multiple Vitamins-Minerals (PRESERVISION AREDS 2) CAPS Take 1 capsule by mouth daily.     neomycin-bacitracin-polymyxin (NEOSPORIN) OINT Apply 1 Application topically as needed for wound care.     pantoprazole  (PROTONIX ) 40 MG tablet Take 1 tablet (40 mg total) by mouth daily. 90 tablet 3   polyethylene glycol (MIRALAX  / GLYCOLAX ) 17 g packet Take 17 g by mouth daily. With coffee     spironolactone  (ALDACTONE ) 25 MG tablet Take 1 tablet (25 mg total) by mouth daily. 90 tablet 3   traZODone  (DESYREL ) 150 MG tablet TAKE 1  TABLET BY MOUTH AT BEDTIME. 90 tablet 1   venlafaxine  XR (EFFEXOR -XR) 150 MG 24 hr capsule Take 1 capsule (150 mg total) by mouth daily with breakfast. 100 capsule 0   White Petrolatum-Mineral Oil (GENTEAL TEARS NIGHT-TIME) OINT Apply 1 Application to eye at bedtime as needed (dry eyes).     traMADol  (ULTRAM ) 50 MG tablet Take 1 tablet (50 mg total) by mouth every 6 (six) hours as needed. 720 tablet 0   No facility-administered medications prior to visit.    ROS Review of Systems  Constitutional:  Negative for appetite change, chills, diaphoresis, fatigue and fever.  HENT: Negative.     Eyes: Negative.   Respiratory:  Negative for cough, chest tightness, shortness of breath and wheezing.   Cardiovascular:  Negative for chest pain, palpitations and leg swelling.  Gastrointestinal:  Negative for abdominal pain, blood in stool, constipation, diarrhea, nausea and vomiting.  Genitourinary:  Negative for difficulty urinating.  Musculoskeletal:  Positive for arthralgias, back pain and gait problem.  Skin:  Positive for pallor.  Neurological:  Negative for dizziness, weakness and headaches.  Hematological:  Negative for adenopathy. Does not bruise/bleed easily.  Psychiatric/Behavioral: Negative.      Objective:  BP (!) 124/58 (BP Location: Left Arm, Patient Position: Sitting, Cuff Size: Normal)   Pulse 75   Temp 98.3 F (36.8 C) (Oral)   Resp 16   Ht 5' 6 (1.676 m)   Wt 229 lb 3.2 oz (104 kg)   SpO2 96%   BMI 36.99 kg/m   BP Readings from Last 3 Encounters:  12/19/23 (!) 124/58  12/18/23 (!) 147/70  11/29/23 132/88    Wt Readings from Last 3 Encounters:  12/19/23 229 lb 3.2 oz (104 kg)  12/18/23 231 lb (104.8 kg)  12/06/23 234 lb (106.1 kg)    Physical Exam Vitals reviewed.  Constitutional:      General: He is not in acute distress.    Appearance: He is not ill-appearing, toxic-appearing or diaphoretic.  HENT:     Nose: Nose normal.     Mouth/Throat:     Mouth: Mucous membranes are moist.   Eyes:     General: No scleral icterus.    Conjunctiva/sclera: Conjunctivae normal.    Cardiovascular:     Rate and Rhythm: Normal rate and regular rhythm.     Heart sounds: No murmur heard.    No friction rub. No gallop.     Comments: EKG- NSR with SA, 67 bpm No LVH, Q waves, or ST/T wave changes  Unchanged  Pulmonary:     Effort: Pulmonary effort is normal.     Breath sounds: No stridor. No wheezing, rhonchi or rales.  Abdominal:     General: Abdomen is protuberant. Bowel sounds are normal. There is no distension.     Palpations: Abdomen is soft. There  is no hepatomegaly, splenomegaly or mass.     Tenderness: There is no abdominal tenderness.   Musculoskeletal:        General: Normal range of motion.     Cervical back: Neck supple.     Right lower leg: No edema.     Left lower leg: No edema.  Lymphadenopathy:     Cervical: No cervical adenopathy.   Skin:    General: Skin is warm and dry.   Neurological:     General: No focal deficit present.     Mental Status: He is alert. Mental status is at baseline.   Psychiatric:  Mood and Affect: Mood normal.        Behavior: Behavior normal.     Lab Results  Component Value Date   WBC 8.2 12/19/2023   HGB 12.1 (L) 12/19/2023   HCT 35.5 (L) 12/19/2023   PLT 253.0 12/19/2023   GLUCOSE 89 12/19/2023   CHOL 128 05/16/2023   TRIG 179.0 (H) 05/16/2023   HDL 43.90 05/16/2023   LDLDIRECT 62.0 08/10/2021   LDLCALC 49 05/16/2023   ALT 25 12/19/2023   AST 15 12/19/2023   NA 139 12/19/2023   K 4.3 12/19/2023   CL 102 12/19/2023   CREATININE 1.14 12/19/2023   BUN 27 (H) 12/19/2023   CO2 31 12/19/2023   TSH 2.65 05/16/2023   PSA 0.59 02/14/2018   INR 1.11 03/25/2016   HGBA1C 6.2 12/19/2023    MR LUMBAR SPINE WO CONTRAST Result Date: 12/12/2023 CLINICAL DATA:  Provided history: Lumbar pain. Additional history provided by the scanning technologist: The patient reports chronic low back pain and bilateral leg weakness. EXAM: MRI LUMBAR SPINE WITHOUT CONTRAST TECHNIQUE: Multiplanar, multisequence MR imaging of the lumbar spine was performed. No intravenous contrast was administered. COMPARISON:  Lumbar spine MRI 04/14/2018. Lumbar CT myelogram 03/07/2006. Chest CT 11/10/2021. FINDINGS: Segmentation: Transitional lumbosacral anatomy. For the purposes of this dictation, the S1 vertebra is transitional and lumbarized. Bilateral assimilation joints at S1-S2. Please note, this spinal numbering differs from that utilized on the prior lumbar spine MRI of 04/14/2018. Alignment: Levocurvature of  the lumbar spine. Slight grade 1 anterolisthesis at L4-L5 and L5-S1. Vertebrae: No lumbar vertebral compression fracture. Minimal degenerative endplate edema at J4-N8. Multilevel ventrolateral osteophytes. No significant disc herniation or stenosis. Conus medullaris and cauda equina: Conus extends to the L1-L2 level. No signal abnormality identified within the visualized distal spinal cord. Paraspinal and other soft tissues: No acute finding within included portions of the abdomen/retroperitoneum. No paraspinal mass or collection. Disc levels: Unless otherwise stated, the level by level findings below have not significantly changed from the prior MRI of 04/14/2018. Multilevel disc degeneration within the lumbar and visualized lower thoracic spine, greatest at T11-T12 (moderate) and at L5-S1 (mild-to-moderate). Disc degeneration has slightly progressed at L5-S1. T11-T12: This level is imaged in the sagittal plane only. Slight disc bulge. No significant spinal canal or foraminal stenosis. T12-L1: No significant disc herniation or stenosis. L1-L2: Broad-based left foraminal/extraforaminal disc protrusion. Facet hypertrophy and mild facet degeneration (greater on the left and progressed on the left). No significant spinal canal stenosis. The disc protrusion contributes to mild-to-moderate left neural foraminal narrowing, which has slightly progressed. Additionally, the disc protrusion could contact the exiting left L1 nerve root beyond the neural foramen. No significant right foraminal stenosis. L2-L3: Mild endplate spurring within the left foraminal zone. Facet hypertrophy and mild facet degeneration. No significant disc herniation or spinal canal stenosis. Mild relative left neural foraminal narrowing. L3-L4: Disc bulge with mild endplate spurring. Facet hypertrophy. No significant spinal canal stenosis. Bilateral neural foraminal narrowing (mild-to-moderate right, mild left). L4-L5: Slight grade 1 anterolisthesis.  Disc bulge asymmetric to the right. Endplate osteophytes along the right aspect of the disc space. Bilateral facet arthropathy (advanced right, moderate-to-advanced left). Ligamentum flavum hypertrophy. Mild right subarticular narrowing. No significant central canal stenosis. Bilateral neural foraminal narrowing (severe right, moderate left), progressed. L5-S1: Slight grade 1 anterolisthesis. Right foraminal zone posterior annular fissure. Interval increase in size of a left subarticular/foraminal zone posterior annular fissure. Disc bulge. Moderate facet arthropathy (greater on the left). Ligamentum flavum hypertrophy on the left.  Minimal relative left subarticular narrowing. No significant central canal stenosis. Bilateral neural foraminal narrowing (mild right, moderate left). S1-S2: Imaged in the sagittal plane only. Transitional level. Facet arthropathy (mild right, moderate left). No significant spinal canal or foraminal stenosis. IMPRESSION: 1. Transitional lumbosacral anatomy. Please note, spinal numbering differs from that utilized on the prior lumbar spine MRI of 04/14/2018. 2. Spondylosis at the lumbar and visualized lower thoracic levels, as outlined within the body of the report and having progressed at multiple levels. 3. No significant central canal stenosis. No more than mild subarticular narrowing. 4. At L1-L2, a broad-based left foraminal/extraforaminal disc protrusion is again demonstrated. This contributes to progressive multifactorial mild-to-moderate left neural foraminal narrowing and could contact the exiting left L1 nerve root beyond the neural foramen. 5. Additional sites of foraminal stenosis, greatest bilaterally at L4-L5 (severe right, moderate left) and on the left at L5-S1 (moderate). 6. Disc degeneration is greatest at T11-T12 (moderate) and L5-S1 (mild-to-moderate). 7. Minimal degenerative endplate edema at M8-U1. 8. Levocurvature of the lumbar spine. 9. Mild grade 1 anterolisthesis  at L4-L5 and L5-S1. Electronically Signed   By: Bascom Lily D.O.   On: 12/12/2023 15:35    Assessment & Plan:  Primary hypertension- BP is well controlled. EKG is negative for LVH. -     Basic metabolic panel with GFR; Future -     EKG 12-Lead  Chronic low back pain, unspecified back pain laterality, unspecified whether sciatica present -     traMADol  HCl; Take 2 tablets (100 mg total) by mouth every 8 (eight) hours.  Dispense: 540 tablet; Refill: 1  Primary osteoarthritis of both knees -     traMADol  HCl; Take 2 tablets (100 mg total) by mouth every 8 (eight) hours.  Dispense: 540 tablet; Refill: 1  Dyslipidemia, goal LDL below 70 -     Hepatic function panel; Future  Chronic hyperglycemia -     Basic metabolic panel with GFR; Future -     Hemoglobin A1c; Future  Iron  deficiency anemia due to chronic blood loss- H/H have improved. -     CBC with Differential/Platelet; Future     Follow-up: Return in about 6 months (around 06/19/2024).  Sandra Crouch, MD

## 2023-12-19 NOTE — Patient Instructions (Signed)
Iron Deficiency Anemia, Adult  Iron deficiency anemia is a condition in which the concentration of red blood cells or hemoglobin in the blood is below normal because of too little iron. Hemoglobin is a substance in red blood cells that carries oxygen to the body's tissues. When the concentration of red blood cells or hemoglobin is too low, not enough oxygen reaches these tissues. Iron deficiency anemia is usually long-lasting, and it develops over time. It may or may not cause symptoms. It is a common type of anemia. What are the causes? This condition may be caused by: Not enough iron in the diet. Abnormal absorption in the gut. Blood loss. What increases the risk? You are more likely to develop this condition if you get menstrual periods (menstruate) or are pregnant. What are the signs or symptoms? Symptoms of this condition may include: Pale skin, lips, and nail beds. Weakness, dizziness, and getting tired easily. Shortness of breath when moving or exercising. Cold hands or feet. Mild anemia may not cause any symptoms. How is this diagnosed? This condition is diagnosed based on: Your medical history. A physical exam. Blood tests. How is this treated? This condition is treated by correcting the cause of your iron deficiency. Treatment may involve: Adding iron-rich foods to your diet. Taking iron supplements. If you are pregnant or breastfeeding, you may need to take extra iron because your normal diet usually does not provide the amount of iron that you need. Increasing vitamin C intake. Vitamin C helps your body absorb iron. Your health care provider may recommend that you take iron supplements along with a glass of orange juice or a vitamin C supplement. Medicines to make heavy menstrual flow lighter. Surgery or additional testing procedures to determine the cause of your anemia. You may need repeat blood tests to determine whether treatment is working. If the treatment does not  seem to be working, you may need more tests. Follow these instructions at home: Medicines Take over-the-counter and prescription medicines only as told by your health care provider. This includes iron supplements and vitamins. This is important because too much iron can be harmful. For the best iron absorption, you should take iron supplements when your stomach is empty. If you cannot tolerate them on an empty stomach, you may need to take them with food. Do not drink milk or take antacids at the same time as your iron supplements. Milk and antacids may interfere with how your body absorbs iron. Iron supplements may turn stool (feces) a darker color and it may appear black. If you cannot tolerate taking iron supplements by mouth, talk with your health care provider about taking them through an IV or through an injection into a muscle. Eating and drinking Talk with your health care provider before changing your diet. Your provider may recommend that you eat foods that contain a lot of iron, such as: Liver. Low-fat (lean) beef. Breads and cereals that have iron added to them (are fortified). Eggs. Dried fruit. Dark green, leafy vegetables. To help your body use the iron from iron-rich foods, eat those foods at the same time as fresh fruits and vegetables that are high in vitamin C. Foods that are high in vitamin C include: Oranges. Peppers. Tomatoes. Mangoes. Managing constipation If you are taking an iron supplement, it may cause constipation. To prevent or treat constipation, you may need to: Drink enough fluid to keep your urine pale yellow. Take over-the-counter or prescription medicines. Eat foods that are high in fiber, such   as beans, whole grains, and fresh fruits and vegetables. Limit foods that are high in fat and processed sugars, such as fried or sweet foods. General instructions Return to your normal activities as told by your health care provider. Ask your health care provider  what activities are safe for you. Keep all follow-up visits. Contact a health care provider if: You feel nauseous or you vomit. You feel weak. You become light-headed when getting up from a sitting or lying down position. You have unexplained sweating. You develop symptoms of constipation. You have a heaviness in your chest. You have trouble breathing with physical activity. Get help right away if: You faint. If this happens, do not drive yourself to the hospital. You have an irregular or rapid heartbeat. Summary Iron deficiency anemia is a condition in which the concentration of red blood cells or hemoglobin in the blood is below normal because of too little iron. This condition is treated by correcting the cause of your iron deficiency. Take over-the-counter and prescription medicines only as told by your health care provider. This includes iron supplements and vitamins. To help your body use the iron from iron-rich foods, eat those foods at the same time as fresh fruits and vegetables that are high in vitamin C. Seek medical help if you have signs or symptoms of worsening anemia. This information is not intended to replace advice given to you by your health care provider. Make sure you discuss any questions you have with your health care provider. Document Revised: 07/28/2021 Document Reviewed: 07/28/2021 Elsevier Patient Education  2024 [employer]  

## 2023-12-22 ENCOUNTER — Ambulatory Visit: Payer: Self-pay | Admitting: Internal Medicine

## 2023-12-24 ENCOUNTER — Other Ambulatory Visit: Payer: Self-pay | Admitting: Internal Medicine

## 2023-12-24 DIAGNOSIS — F32A Depression, unspecified: Secondary | ICD-10-CM

## 2023-12-24 DIAGNOSIS — F411 Generalized anxiety disorder: Secondary | ICD-10-CM

## 2024-01-16 DIAGNOSIS — M545 Low back pain, unspecified: Secondary | ICD-10-CM | POA: Diagnosis not present

## 2024-01-16 DIAGNOSIS — M7918 Myalgia, other site: Secondary | ICD-10-CM | POA: Diagnosis not present

## 2024-01-17 ENCOUNTER — Ambulatory Visit: Admitting: Internal Medicine

## 2024-01-17 ENCOUNTER — Ambulatory Visit: Payer: Self-pay

## 2024-01-17 ENCOUNTER — Ambulatory Visit: Payer: Self-pay | Admitting: Internal Medicine

## 2024-01-17 ENCOUNTER — Encounter: Payer: Self-pay | Admitting: Internal Medicine

## 2024-01-17 ENCOUNTER — Ambulatory Visit (INDEPENDENT_AMBULATORY_CARE_PROVIDER_SITE_OTHER)

## 2024-01-17 VITALS — BP 122/60 | HR 100 | Temp 98.6°F | Ht 66.0 in | Wt 232.0 lb

## 2024-01-17 DIAGNOSIS — R059 Cough, unspecified: Secondary | ICD-10-CM | POA: Insufficient documentation

## 2024-01-17 DIAGNOSIS — R051 Acute cough: Secondary | ICD-10-CM | POA: Diagnosis not present

## 2024-01-17 DIAGNOSIS — I10 Essential (primary) hypertension: Secondary | ICD-10-CM

## 2024-01-17 DIAGNOSIS — R062 Wheezing: Secondary | ICD-10-CM | POA: Diagnosis not present

## 2024-01-17 MED ORDER — LEVOFLOXACIN 500 MG PO TABS: 500.0000 mg | ORAL_TABLET | Freq: Every day | ORAL | 0 refills | Status: DC

## 2024-01-17 MED ORDER — PREDNISONE 10 MG PO TABS
ORAL_TABLET | ORAL | 0 refills | Status: DC
Start: 2024-01-17 — End: 2024-04-12

## 2024-01-17 NOTE — Assessment & Plan Note (Signed)
Mild to mod, for prednisone taper,,  to f/u any worsening symptoms or concerns  

## 2024-01-17 NOTE — Telephone Encounter (Signed)
 FYI Only or Action Required?: FYI only for provider.  Patient was last seen in primary care on 12/19/2023 by Aaron Debby CROME, MD.  Called Nurse Triage reporting Cough.  Symptoms began several weeks ago.  Interventions attempted: Prescription medications: inhaler and abx.  Symptoms are: unchanged.  Triage Disposition: See HCP Within 4 Hours (Or PCP Triage)  Patient/caregiver understands and will follow disposition?: Yes, will follow disposition  Copied from CRM [id number]. Topic: Clinical - Red Word Triage >> Jan 17, 2024 10:52 AM Aaron [name] wrote: Red Word that prompted transfer to Nurse Triage: Pt says he has pneumonia (diagnosed a week ago) and was given an antibiotic. He says he has been needing to take his inhaler more lately because he has been coughing and wheezing. Pt says when he was taking the antibiotics he thought he was getting better. But since stopping his voice has been changing along with coughing and wheezing. Pt says he feels like he has something stuck in his throat. No fever and no other symptoms. Reason for Disposition  Wheezing is present  Answer Assessment - Initial Assessment Questions 1. ONSET: When did the cough begin?      2 weeks 2. SEVERITY: How bad is the cough today?      mild 3. SPUTUM: Describe the color of your sputum (e.g., none, dry cough; clear, white, yellow, green)     denies 4. HEMOPTYSIS: Are you coughing up any blood? If Yes, ask: How much? (e.g., flecks, streaks, tablespoons, etc.)     denies 5. DIFFICULTY BREATHING: Are you having difficulty breathing? If Yes, ask: How bad is it? (e.g., mild, moderate, severe)      Only when coughing 6. FEVER: Do you have a fever? If Yes, ask: What is your temperature, how was it measured, and when did it start?     denies 10. OTHER SYMPTOMS: Do you have any other symptoms? (e.g., runny nose, wheezing, chest pain)       Some wheezing  Protocols used: Cough - Acute Productive-A-AH

## 2024-01-17 NOTE — Patient Instructions (Addendum)
 Please take all new medication as prescribed- the antibiotic, and prednisone   Please continue all other medications as before, including the Delsym  for cough, and inhaler as needed  Please have the pharmacy call with any other refills you may need.  Please keep your appointments with your specialists as you may have planned  Please go to the XRAY Department in the first floor for the x-ray testing  You will be contacted by phone if any changes need to be made immediately.  Otherwise, you will receive a letter about your results with an explanation, but please check with MyChart first.

## 2024-01-17 NOTE — Assessment & Plan Note (Addendum)
 Mild to mod, for antibx course levaquin  500 mg every day x 10 days, to f/u any worsening symptoms or concerns, also fro cxr

## 2024-01-17 NOTE — Progress Notes (Signed)
 Patient ID: Aaron [name], male   DOB: 15-May-1946, 78 y.o.   MRN: [id number]        Chief Complaint: follow up HTN, cough, wheezing       HPI:  Aaron [name] is a 78 y.o. male Here with acute onset mild to mod 7 days ST, HA, general weakness and malaise, with prod cough greenish sputum, but Pt denies chest pain, orthopnea, PND, increased LE swelling, palpitations, dizziness or syncope,  but has had mild wheezing and sob with this as well, persistent despite seeing [facility] and finishing zpack and vantin course.  Has not had steroid.  Pt denies polydipsia, polyuria, or new focal neuro s/s.          Wt Readings from Last 3 Encounters:  01/17/24 232 lb (105.2 kg)  12/19/23 229 lb 3.2 oz (104 kg)  12/18/23 231 lb (104.8 kg)   BP Readings from Last 3 Encounters:  01/17/24 122/60  12/19/23 (!) 124/58  12/18/23 (!) 147/70         Past Medical History:  Diagnosis Date   Anemia    @ birth   Anxiety    Asthma    Blood transfusion    @ birth   Cancer [facility])    Head - skin cancer   Cataract    L eye   Constipation due to pain medication    Coronary artery disease    DES LAD 2005   Depression    Full dentures    GERD (gastroesophageal reflux disease)    Headache(784.0)    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; 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   Heart murmur    Hyperlipidemia    Hypertension    Insomnia    Osteoarthritis    PONV (postoperative nausea and vomiting)    2006 - knee surgery   Pre-diabetes    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18   Psoriasis    PTSD (post-traumatic stress disorder)    Pulmonary embolism (HCC) ~ 03/2006   bilaterally   Seasonal allergies    Shortness of breath 06/08/2011   w/exertion; that's why I'm here   Sleep apnea    Sleep apnea, obstructive    Umbilical hernia    Past Surgical History:  Procedure Laterality Date   BIOPSY  10/17/2022   Procedure: BIOPSY;  Surgeon: Eda Iha, MD;  Location: [facility];  Service: Gastroenterology;;   VASSIE  STUDY  03/24/2022   Procedure: BUBBLE STUDY;  Surgeon: Eda Iha, MD;  Location: [facility] ENDOSCOPY;  Service: Gastroenterology;;   CARDIAC CATHETERIZATION     carpel tunnel release Bilateral ~ 06/2005/~ 08/2005   right/left   COLONOSCOPY W/ BIOPSIES AND POLYPECTOMY     COLONOSCOPY WITH PROPOFOL  N/A 08/11/2022   Procedure: COLONOSCOPY WITH PROPOFOL ;  Surgeon: Eda Iha, MD;  Location: [facility] ENDOSCOPY;  Service: Gastroenterology;  Laterality: N/A;   CORONARY ANGIOPLASTY     CORONARY STENT PLACEMENT  02/13/2004   1   ENUCLEATION Right    age 43   ESOPHAGOGASTRODUODENOSCOPY (EGD) WITH PROPOFOL  N/A 03/24/2022   Procedure: ESOPHAGOGASTRODUODENOSCOPY (EGD) WITH PROPOFOL ;  Surgeon: Eda Iha, MD;  Location: [facility] ENDOSCOPY;  Service: Gastroenterology;  Laterality: N/A;   ESOPHAGOGASTRODUODENOSCOPY (EGD) WITH PROPOFOL  N/A 08/11/2022   Procedure: ESOPHAGOGASTRODUODENOSCOPY (EGD) WITH PROPOFOL ;  Surgeon: Eda Iha, MD;  Location: [facility];  Service: Gastroenterology;  Laterality: N/A;   ESOPHAGOGASTRODUODENOSCOPY (EGD) WITH PROPOFOL  N/A 10/17/2022   Procedure: ESOPHAGOGASTRODUODENOSCOPY (EGD) WITH PROPOFOL ;  Surgeon: Eda Iha,  MD;  Location: [facility]OSCOPY;  Service: Gastroenterology;  Laterality: N/A;   EYE SURGERY     age 21; ruptured  right pupil   EYE SURGERY     age 63   EYE SURGERY  1957   placed artificial right eye   HEMOSTASIS CONTROL  03/24/2022   Procedure: HEMOSTASIS CONTROL;  Surgeon: Eda Iha, MD;  Location: [facility]COPY;  Service: Gastroenterology;;   HOT HEMOSTASIS N/A 03/24/2022   Procedure: HOT HEMOSTASIS (ARGON PLASMA COAGULATION/BICAP);  Surgeon: Eda Iha, MD;  Location: [facility] ENDOSCOPY;  Service: Gastroenterology;  Laterality: N/A;   HOT HEMOSTASIS N/A 08/11/2022   Procedure: HOT HEMOSTASIS (ARGON PLASMA COAGULATION/BICAP);  Surgeon: Eda Iha, MD;  Location: [facility];  Service: Gastroenterology;  Laterality: N/A;  stomach   HOT  HEMOSTASIS N/A 10/17/2022   Procedure: HOT HEMOSTASIS (ARGON PLASMA COAGULATION/BICAP);  Surgeon: Eda Iha, MD;  Location: [facility];  Service: Gastroenterology;  Laterality: N/A;   INGUINAL HERNIA REPAIR Right 1983   INGUINAL HERNIA REPAIR Left 1985   IR IVC FILTER PLMT / S&I /IMG GUID/MOD SED  03/29/2022   KNEE ARTHROSCOPY  ~ 06/2005   right   LEFT HEART CATH AND CORONARY ANGIOGRAPHY N/A 03/25/2022   Procedure: LEFT HEART CATH AND CORONARY ANGIOGRAPHY;  Surgeon: Wendel Lurena POUR, MD;  Location: [facility];  Service: Cardiovascular;  Laterality: N/A;   MULTIPLE TOOTH EXTRACTIONS     POLYPECTOMY  08/11/2022   Procedure: POLYPECTOMY;  Surgeon: Eda Iha, MD;  Location: [facility]COPY;  Service: Gastroenterology;;  colon   spinal injections  2006-2007   maybe 10 injections for herniated discs   TEE WITHOUT CARDIOVERSION  03/24/2022   Procedure: TRANSESOPHAGEAL ECHOCARDIOGRAM (TEE);  Surgeon: Eda Iha, MD;  Location: [facility] ENDOSCOPY;  Service: Gastroenterology;;   TONSILLECTOMY     when I was real young   TOTAL KNEE ARTHROPLASTY Left 10/09/2015   Procedure: TOTAL KNEE ARTHROPLASTY;  Surgeon: Norleen Gavel, MD;  Location: [facility] OR;  Service: Orthopedics;  Laterality: Left;   TOTAL KNEE ARTHROPLASTY Right 04/04/2016   Procedure: TOTAL KNEE ARTHROPLASTY;  Surgeon: Norleen Gavel, MD;  Location: [facility] OR;  Service: Orthopedics;  Laterality: Right;    reports that he quit smoking about 28 years ago. His smoking use included cigarettes. He started smoking about 53 years ago. He has a 37.5 pack-year smoking history. He has never used smokeless tobacco. He reports that he does not drink alcohol and does not use drugs. family history includes Asthma in his brother, father, and sister; Breast cancer in his sister; Emphysema in his father; Liver cancer in his sister. Allergies  Allergen Reactions   Wellbutrin [Bupropion] Other (See Comments)    Hallucinations, sweating   Amoxicillin  Palpitations   Current Outpatient Medications on File Prior to Visit  Medication Sig Dispense Refill   albuterol  (VENTOLIN  HFA) 108 (90 Base) MCG/ACT inhaler Inhale 2 puffs into the lungs.     ascorbic acid (C 500/ROSE HIPS) 500 MG tablet Take 500 mg by mouth daily.     aspirin  EC 81 MG tablet Take 1 tablet (81 mg total) by mouth daily. Swallow whole. 30 tablet 0   atorvastatin  (LIPITOR) 80 MG tablet Take 1 tablet (80 mg total) by mouth daily. 90 tablet 3   Calcium  Carb-Cholecalciferol  (CALCIUM  600 + D PO) Take 1 tablet by mouth daily.     cholecalciferol  (VITAMIN D3) 25 MCG (1000 UNIT) tablet Take 1,000 Units by mouth daily.     clonazePAM  (KLONOPIN ) 0.5 MG tablet TAKE  1 TABLET BY MOUTH 2 TIMES DAILY AS NEEDED FOR ANXIETY. 180 tablet 0   ezetimibe  (ZETIA ) 10 MG tablet Take 1 tablet (10 mg total) by mouth daily. 100 tablet 2   ferrous sulfate  325 (65 FE) MG EC tablet TAKE 1 TABLET BY MOUTH DAILY WITH BREAKFAST. SCHEDULE AN APPOINTMENT FOR FURTHER REFILLS 90 tablet 3   furosemide  (LASIX ) 40 MG tablet Take 1 tablet (40 mg total) by mouth daily. 90 tablet 3   hydrALAZINE  (APRESOLINE ) 25 MG tablet Take 1 tablet (25 mg total) by mouth 3 (three) times daily. 270 tablet 3   Multiple Vitamins-Minerals (PRESERVISION AREDS 2) CAPS Take 1 capsule by mouth daily.     neomycin-bacitracin-polymyxin (NEOSPORIN) OINT Apply 1 Application topically as needed for wound care.     pantoprazole  (PROTONIX ) 40 MG tablet Take 1 tablet (40 mg total) by mouth daily. 90 tablet 3   polyethylene glycol (MIRALAX  / GLYCOLAX ) 17 g packet Take 17 g by mouth daily. With coffee     spironolactone  (ALDACTONE ) 25 MG tablet Take 1 tablet (25 mg total) by mouth daily. 90 tablet 3   traMADol  (ULTRAM ) 50 MG tablet Take 2 tablets (100 mg total) by mouth every 8 (eight) hours. 540 tablet 1   traZODone  (DESYREL ) 150 MG tablet TAKE 1 TABLET BY MOUTH AT BEDTIME. 90 tablet 1   venlafaxine  XR (EFFEXOR -XR) 150 MG 24 hr capsule TAKE 1 CAPSULE  BY MOUTH DAILY WITH BREAKFAST. 100 capsule 0   White Petrolatum-Mineral Oil (GENTEAL TEARS NIGHT-TIME) OINT Apply 1 Application to eye at bedtime as needed (dry eyes).     No current facility-administered medications on file prior to visit.        ROS:  All others reviewed and negative.  Objective        PE:  BP 122/60 (BP Location: Left Arm, Patient Position: Sitting, Cuff Size: Normal)   Pulse 100   Temp 98.6 F (37 C) (Oral)   Ht 5' 6 (1.676 m)   Wt 232 lb (105.2 kg)   SpO2 95%   BMI 37.45 kg/m                 Constitutional: Pt appears mild to mod ill               HENT: Head: NCAT.                Right Ear: External ear normal.                 Left Ear: External ear normal.                Eyes: . Pupils are equal, round, and reactive to light. Conjunctivae and EOM are normal               Nose: without d/c or deformity               Neck: Neck supple. Gross normal ROM               Cardiovascular: Normal rate and regular rhythm.                 Pulmonary/Chest: Effort normal and breath sounds decreased without rales but with few bilateral wheezing.                     Neurological: Pt is alert. At baseline orientation, motor grossly intact  Skin: Skin is warm. No rashes, no other new lesions, LE edema - none               Psychiatric: Pt behavior is normal without agitation   Micro: none  Cardiac tracings I have personally interpreted today:  none  Pertinent Radiological findings (summarize): none   Lab Results  Component Value Date   WBC 8.2 12/19/2023   HGB 12.1 (L) 12/19/2023   HCT 35.5 (L) 12/19/2023   PLT 253.0 12/19/2023   GLUCOSE 89 12/19/2023   CHOL 128 05/16/2023   TRIG 179.0 (H) 05/16/2023   HDL 43.90 05/16/2023   LDLDIRECT 62.0 08/10/2021   LDLCALC 49 05/16/2023   ALT 25 12/19/2023   AST 15 12/19/2023   NA 139 12/19/2023   K 4.3 12/19/2023   CL 102 12/19/2023   CREATININE 1.14 12/19/2023   BUN 27 (H) 12/19/2023   CO2 31 12/19/2023    TSH 2.65 05/16/2023   PSA 0.59 02/14/2018   INR 1.11 03/25/2016   HGBA1C 6.2 12/19/2023   Assessment/Plan:  ASAR EVILSIZER is a 78 y.o. White or Caucasian [1] male with  has a past medical history of Anemia, Anxiety, Asthma, Blood transfusion, Cancer (HCC), Cataract, Constipation due to pain medication, Coronary artery disease, Depression, Full dentures, GERD (gastroesophageal reflux disease), Headache(784.0), Heart murmur, Hyperlipidemia, Hypertension, Insomnia, Osteoarthritis, PONV (postoperative nausea and vomiting), Pre-diabetes, Prosthetic eye globe, Psoriasis, PTSD (post-traumatic stress disorder), Pulmonary embolism (HCC) (~ 03/2006), Seasonal allergies, Shortness of breath (06/08/2011), Sleep apnea, Sleep apnea, obstructive, and Umbilical hernia.  Wheezing Mild to mod, for prednisone  taper,,  to f/u any worsening symptoms or concerns  Cough Mild to mod, for antibx course levaquin  500 mg every day x 10 days, to f/u any worsening symptoms or concerns, also fro cxr  Hypertension BP Readings from Last 3 Encounters:  01/17/24 122/60  12/19/23 (!) 124/58  12/18/23 (!) 147/70   Stable, pt to continue medical treatment hydralazine  25 mg tid   Followup: Return if symptoms worsen or fail to improve.  Lynwood Rush, MD 01/17/2024 8:52 PM [facility]

## 2024-01-17 NOTE — Assessment & Plan Note (Signed)
 BP Readings from Last 3 Encounters:  01/17/24 122/60  12/19/23 (!) 124/58  12/18/23 (!) 147/70   Stable, pt to continue medical treatment hydralazine  25 mg tid

## 2024-01-26 ENCOUNTER — Other Ambulatory Visit: Payer: Self-pay | Admitting: Physician Assistant

## 2024-01-26 DIAGNOSIS — K219 Gastro-esophageal reflux disease without esophagitis: Secondary | ICD-10-CM

## 2024-01-26 NOTE — Telephone Encounter (Signed)
 Lm on vm for patient to return call. Needs OV with Alan, [place].

## 2024-02-02 DIAGNOSIS — M5416 Radiculopathy, lumbar region: Secondary | ICD-10-CM | POA: Diagnosis not present

## 2024-02-07 ENCOUNTER — Inpatient Hospital Stay: Admitting: Internal Medicine

## 2024-02-07 ENCOUNTER — Other Ambulatory Visit: Payer: Self-pay | Admitting: Internal Medicine

## 2024-02-07 ENCOUNTER — Inpatient Hospital Stay: Attending: Internal Medicine

## 2024-02-07 VITALS — BP 129/59 | HR 68 | Temp 97.5°F | Resp 17 | Ht 66.0 in | Wt 228.0 lb

## 2024-02-07 DIAGNOSIS — R5383 Other fatigue: Secondary | ICD-10-CM | POA: Insufficient documentation

## 2024-02-07 DIAGNOSIS — K31811 Angiodysplasia of stomach and duodenum with bleeding: Secondary | ICD-10-CM | POA: Diagnosis not present

## 2024-02-07 DIAGNOSIS — D500 Iron deficiency anemia secondary to blood loss (chronic): Secondary | ICD-10-CM | POA: Diagnosis not present

## 2024-02-07 DIAGNOSIS — D508 Other iron deficiency anemias: Secondary | ICD-10-CM

## 2024-02-07 LAB — CBC WITH DIFFERENTIAL (CANCER CENTER ONLY)
Abs Immature Granulocytes: 0.04 K/uL (ref 0.00–0.07)
Basophils Absolute: 0.1 K/uL (ref 0.0–0.1)
Basophils Relative: 1 %
Eosinophils Absolute: 0.4 K/uL (ref 0.0–0.5)
Eosinophils Relative: 6 %
HCT: 33.4 % — ABNORMAL LOW (ref 39.0–52.0)
Hemoglobin: 11.2 g/dL — ABNORMAL LOW (ref 13.0–17.0)
Immature Granulocytes: 1 %
Lymphocytes Relative: 15 %
Lymphs Abs: 0.9 K/uL (ref 0.7–4.0)
MCH: 30 pg (ref 26.0–34.0)
MCHC: 33.5 g/dL (ref 30.0–36.0)
MCV: 89.5 fL (ref 80.0–100.0)
Monocytes Absolute: 0.7 K/uL (ref 0.1–1.0)
Monocytes Relative: 11 %
Neutro Abs: 4.1 K/uL (ref 1.7–7.7)
Neutrophils Relative %: 66 %
Platelet Count: 170 K/uL (ref 150–400)
RBC: 3.73 MIL/uL — ABNORMAL LOW (ref 4.22–5.81)
RDW: 13.1 % (ref 11.5–15.5)
WBC Count: 6.2 K/uL (ref 4.0–10.5)
nRBC: 0 % (ref 0.0–0.2)

## 2024-02-07 LAB — IRON AND IRON BINDING CAPACITY (CC-WL,HP ONLY)
Iron: 38 ug/dL — ABNORMAL LOW (ref 45–182)
Saturation Ratios: 16 % — ABNORMAL LOW (ref 17.9–39.5)
TIBC: 244 ug/dL — ABNORMAL LOW (ref 250–450)
UIBC: 206 ug/dL (ref 117–376)

## 2024-02-07 LAB — FERRITIN: Ferritin: 418 ng/mL — ABNORMAL HIGH (ref 24–336)

## 2024-02-07 NOTE — Progress Notes (Signed)
 [facility] Telephone:([phone]) [phone]   [phone]  OFFICE PROGRESS NOTE  Joshua Debby CROME, MD [address] [place] [address]  DIAGNOSIS: Microcytic anemia secondary to iron  deficiency secondary to gastrointestinal hemorrhage.  PRIOR THERAPY: Iron  infusion with Venofer  300 Mg IV weekly on as-needed basis.  CURRENT THERAPY: Ferrous sulfate  325 mg p.o. daily.  INTERVAL HISTORY: Aaron CZAJA 78 y.o. male returns to the clinic today for follow-up visit accompanied by his wife. Discussed the use of AI scribe software for clinical note transcription with the patient, who gave verbal consent to proceed.  History of Present Illness Aaron [name] is a 78 year old male with microcytic anemia secondary to iron  deficiency who presents for evaluation and repeat blood work. He is accompanied by his wife.  He has a history of microcytic anemia secondary to iron  deficiency due to gastrointestinal hemorrhage. He has been treated with iron  infusions using Venofer  on an as-needed basis and is currently taking ferrous sulfate  325 mg daily. His hemoglobin level today is 11.2; previous readings were 12 in June and 11.6 in May. He is awaiting results from an iron  study.  He experienced pneumonia in late June through early July, lasting about two weeks. He continues to experience fatigue, which has been ongoing for the last three to four months. He attributes some of his tiredness to recovering from pneumonia. No other new symptoms are reported.  He mentions a recent move from his house to an apartment, which he finds stressful. He is currently living beyond his means and is considering seeking assistance from the city for housing. His oldest son, who lives in [place], has been helping him with the move.  He recalls a fall in his apartment, which has a concrete floor, resulting in his head hitting the floor. He uses a walker around the apartment to prevent further  falls.  He also mentions a previous discussion with his cardiologist about low triglycerides, which were measured at 179 in November, but he does not recall specific details.       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  MEDICATIONS:  Current Outpatient Medications  Medication Sig Dispense Refill   albuterol  (VENTOLIN  HFA) 108 (90  Base) MCG/ACT inhaler Inhale 2 puffs into the lungs.     ascorbic acid (C 500/ROSE HIPS) 500 MG tablet Take 500 mg by mouth daily.     aspirin  EC 81 MG tablet Take 1 tablet (81 mg total) by mouth daily. Swallow whole. 30 tablet 0   atorvastatin  (LIPITOR) 80 MG tablet Take 1 tablet (80 mg total) by mouth daily. 90 tablet 3   Calcium  Carb-Cholecalciferol  (CALCIUM  600 + D PO) Take 1 tablet by mouth daily.     cholecalciferol  (VITAMIN D3) 25 MCG (1000 UNIT) tablet Take 1,000 Units by mouth daily.     clonazePAM  (KLONOPIN ) 0.5 MG tablet TAKE 1 TABLET BY MOUTH 2 TIMES DAILY AS NEEDED FOR ANXIETY. 180 tablet 0   ezetimibe  (ZETIA ) 10 MG tablet Take  1 tablet (10 mg total) by mouth daily. 100 tablet 2   ferrous sulfate  325 (65 FE) MG EC tablet TAKE 1 TABLET BY MOUTH DAILY WITH BREAKFAST. SCHEDULE AN APPOINTMENT FOR FURTHER REFILLS 90 tablet 3   furosemide  (LASIX ) 40 MG tablet Take 1 tablet (40 mg total) by mouth daily. 90 tablet 3   hydrALAZINE  (APRESOLINE ) 25 MG tablet Take 1 tablet (25 mg total) by mouth 3 (three) times daily. 270 tablet 3   levofloxacin  (LEVAQUIN ) 500 MG tablet Take 1 tablet (500 mg total) by mouth daily. 10 tablet 0   Multiple Vitamins-Minerals (PRESERVISION AREDS 2) CAPS Take 1 capsule by mouth daily.     neomycin-bacitracin-polymyxin (NEOSPORIN) OINT Apply 1 Application topically as needed for wound care.     pantoprazole  (PROTONIX ) 40 MG tablet TAKE 1 TABLET BY MOUTH EVERY DAY 90 tablet 1   polyethylene glycol (MIRALAX  / GLYCOLAX ) 17 g packet Take 17 g by mouth daily. With coffee     predniSONE  (DELTASONE ) 10 MG tablet 3 tabs by mouth per day for 3 days,2tabs per day for 3 days,1tab per day for 3 days 18 tablet 0   spironolactone  (ALDACTONE ) 25 MG tablet Take 1 tablet (25 mg total) by mouth daily. 90 tablet 3   traMADol  (ULTRAM ) 50 MG tablet Take 2 tablets (100 mg total) by mouth every 8 (eight) hours. 540 tablet 1   traZODone  (DESYREL ) 150 MG tablet TAKE 1 TABLET BY MOUTH AT BEDTIME. 90 tablet 1   venlafaxine  XR (EFFEXOR -XR) 150 MG 24 hr capsule TAKE 1 CAPSULE BY MOUTH DAILY WITH BREAKFAST. 100 capsule 0   White Petrolatum-Mineral Oil (GENTEAL TEARS NIGHT-TIME) OINT Apply 1 Application to eye at bedtime as needed (dry eyes).     No current facility-administered medications for this visit.    SURGICAL HISTORY:  Past Surgical History:  Procedure Laterality Date   BIOPSY  10/17/2022   Procedure: BIOPSY;  Surgeon: Eda Iha, MD;  Location: [facility] ENDOSCOPY;  Service: Gastroenterology;;   VASSIE STUDY  03/24/2022   Procedure: BUBBLE STUDY;  Surgeon: Eda Iha, MD;  Location: [facility] ENDOSCOPY;  Service:  Gastroenterology;;   CARDIAC CATHETERIZATION     carpel tunnel release Bilateral ~ 06/2005/~ 08/2005   right/left   COLONOSCOPY W/ BIOPSIES AND POLYPECTOMY     COLONOSCOPY WITH PROPOFOL  N/A 08/11/2022   Procedure: COLONOSCOPY WITH PROPOFOL ;  Surgeon: Eda Iha, MD;  Location: [facility] ENDOSCOPY;  Service: Gastroenterology;  Laterality: N/A;   CORONARY ANGIOPLASTY     CORONARY STENT PLACEMENT  02/13/2004   1   ENUCLEATION Right    age 77   ESOPHAGOGASTRODUODENOSCOPY (EGD) WITH PROPOFOL  N/A 03/24/2022   Procedure: ESOPHAGOGASTRODUODENOSCOPY (EGD) WITH PROPOFOL ;  Surgeon: Eda Iha, MD;  Location: [facility] ENDOSCOPY;  Service: Gastroenterology;  Laterality: N/A;   ESOPHAGOGASTRODUODENOSCOPY (EGD) WITH PROPOFOL  N/A 08/11/2022   Procedure: ESOPHAGOGASTRODUODENOSCOPY (EGD) WITH PROPOFOL ;  Surgeon: Eda Iha, MD;  Location: [facility] ENDOSCOPY;  Service: Gastroenterology;  Laterality: N/A;   ESOPHAGOGASTRODUODENOSCOPY (EGD) WITH PROPOFOL  N/A 10/17/2022   Procedure: ESOPHAGOGASTRODUODENOSCOPY (EGD) WITH PROPOFOL ;  Surgeon: Eda Iha, MD;  Location: [facility] ENDOS[facility];  Service: Gastroenterology;  Laterality: N/A;   EYE SURGERY     age 58; ruptured  right pupil   EYE SURGERY     age 65   EYE SURGERY  1957   placed artificial right eye   HEMOSTASIS CONTROL  03/24/2022   Procedure: HEMOSTASIS CONTROL;  Surgeon: Eda Iha, MD;  Location: [facility] ENDOSCOPY;  Service: Gastroenterology;;   HOT HEMOSTASIS N/A 03/24/2022   Procedure: HOT HEMOSTASIS (ARGON PLASMA COAGULATION/BICAP);  Surgeon: Eda Iha, MD;  Location: [facility] ENDOSCOPY;  Service: Gastroenterology;  Laterality: N/A;   HOT HEMOSTASIS N/A 08/11/2022   Procedure: HOT HEMOSTASIS (ARGON PLASMA COAGULATION/BICAP);  Surgeon: Eda Iha, MD;  Location: [facility];  Service: Gastroenterology;  Laterality: N/A;  stomach   HOT HEMOSTASIS N/A 10/17/2022   Procedure: HOT HEMOSTASIS (ARGON PLASMA COAGULATION/BICAP);  Surgeon: Eda Iha, MD;  Location: [facility];  Service: Gastroenterology;  Laterality: N/A;   INGUINAL HERNIA REPAIR Right 1983   INGUINAL HERNIA REPAIR Left 1985   IR IVC FILTER PLMT / S&I /IMG GUID/MOD SED  03/29/2022   KNEE ARTHROSCOPY  ~ 06/2005   right   LEFT HEART CATH AND CORONARY ANGIOGRAPHY N/A 03/25/2022   Procedure: LEFT HEART CATH AND CORONARY ANGIOGRAPHY;  Surgeon: Wendel Lurena POUR, MD;  Location: [facility] INVASIVE CV LAB;  Service: Cardiovascular;  Laterality: N/A;   MULTIPLE TOOTH EXTRACTIONS     POLYPECTOMY  08/11/2022   Procedure: POLYPECTOMY;  Surgeon: Eda Iha, MD;  Location: [facility] ENDOSCOPY;  Service: Gastroenterology;;  colon   spinal injections  2006-2007   maybe 10 injections for herniated discs   TEE WITHOUT CARDIOVERSION  03/24/2022   Procedure: TRANSESOPHAGEAL ECHOCARDIOGRAM (TEE);  Surgeon: Eda Iha, MD;  Location: [facility] ENDOSCOPY;  Service: Gastroenterology;;   TONSILLECTOMY     when I was real young   TOTAL KNEE ARTHROPLASTY Left 10/09/2015   Procedure: TOTAL KNEE ARTHROPLASTY;  Surgeon: Norleen Gavel, MD;  Location: [facility] OR;  Service: Orthopedics;  Laterality: Left;   TOTAL KNEE ARTHROPLASTY Right 04/04/2016   Procedure: TOTAL KNEE ARTHROPLASTY;  Surgeon: Norleen Gavel, MD;  Location: [facility] OR;  Service: Orthopedics;  Laterality: Right;    REVIEW OF SYSTEMS:  A comprehensive review of systems was negative except for: Constitutional: positive for fatigue Respiratory: positive for dyspnea on exertion Musculoskeletal: positive for arthralgias   PHYSICAL EXAMINATION: General appearance: alert, cooperative, fatigued, and no distress Head: Normocephalic, without obvious abnormality, atraumatic Neck: no adenopathy, no JVD, supple, symmetrical, trachea midline, and thyroid  not enlarged, symmetric, no tenderness/mass/nodules Lymph nodes: Cervical, supraclavicular, and axillary nodes normal. Resp: clear to auscultation bilaterally Back: symmetric, no curvature. ROM normal. No  CVA tenderness. Cardio: regular rate and rhythm, S1, S2 normal, no murmur, click, rub or gallop GI: soft, non-tender; bowel sounds normal; no masses,  no organomegaly Extremities: extremities normal, atraumatic, no cyanosis or edema  ECOG PERFORMANCE STATUS: 1 - Symptomatic but completely ambulatory  Blood pressure (!) 129/59, pulse 68, temperature (!) 97.5 F (36.4 C), temperature source Temporal, resp. rate 17, height 5' 6 (1.676 m), weight 228 lb (103.4 kg), SpO2 99%.  LABORATORY DATA: Lab Results  Component Value Date  WBC 6.2 02/07/2024   HGB 11.2 (L) 02/07/2024   HCT 33.4 (L) 02/07/2024   MCV 89.5 02/07/2024   PLT 170 02/07/2024      Chemistry      Component Value Date/Time   NA 139 12/19/2023 1509   NA 139 04/18/2022 1509   K 4.3 12/19/2023 1509   CL 102 12/19/2023 1509   CO2 31 12/19/2023 1509   BUN 27 (H) 12/19/2023 1509   BUN 24 04/18/2022 1509   CREATININE 1.14 12/19/2023 1509   CREATININE 1.24 06/15/2020 1306   CREATININE 1.16 02/10/2020 1532      Component Value Date/Time   CALCIUM  9.2 12/19/2023 1509   ALKPHOS 85 12/19/2023 1509   AST 15 12/19/2023 1509   AST 17 06/15/2020 1306   ALT 25 12/19/2023 1509   ALT 12 06/15/2020 1306   BILITOT 0.6 12/19/2023 1509   BILITOT 0.3 12/17/2020 1017   BILITOT 0.4 06/15/2020 1306       RADIOGRAPHIC STUDIES: DG Chest 2 View Result Date: 01/17/2024 CLINICAL DATA:  One week of persistent cough, wheezing and right lower lobe rales. EXAM: CHEST - 2 VIEW COMPARISON:  Radiograph 04/19/2022 FINDINGS: The lungs are hyperinflated with bronchial thickening. No focal airspace disease. Stable heart size and mediastinal contours. Aortic atherosclerosis. No pulmonary edema, pleural effusion, or pneumothorax. No acute osseous findings. IMPRESSION: Hyperinflation with bronchial thickening, can be seen with asthma or bronchitis. Electronically Signed   By: Andrea Gasman M.D.   On: 01/17/2024 18:45    ASSESSMENT AND PLAN: This  is a very pleasant 78 years old white male with history of iron  deficiency anemia secondary to chronic gastrointestinal blood loss. The patient is currently on oral iron  tablet with ferrous sulfate  325 mg p.o. daily and tolerating this treatment well. The patient was found recently to have significant iron  deficiency anemia. He has significant gastric antral vascular ectasia with acute blood loss few weeks ago. The patient received 3 units of PRBCs transfusion during his hospitalization. He was treated with iron  infusion with Venofer  at the [facility] and tolerated it fairly well. He is currently on ferrous sulfate  325 mg p.o. daily. Repeat CBC today showed hemoglobin of 11.2.  Iron  study and ferritin are still pending. Assessment and Plan Assessment & Plan Iron  deficiency anemia due to gastrointestinal hemorrhage Chronic microcytic anemia secondary to iron  deficiency from gastrointestinal hemorrhage. Hemoglobin level decreased to 11.2 g/dL from 12 g/dL in June and 88.3 g/dL in May, likely influenced by recent pneumonia and associated medications. Awaiting results of iron  studies to determine current iron  status. - Continue oral ferrous sulfate  325 mg daily. - Review iron  study results when available. - Arrange iron  infusion if iron  levels are very low. - Schedule follow-up appointment in three months.  Fatigue Persistent fatigue over the last three to four months, potentially exacerbated by recent pneumonia and anemia. No additional symptoms reported. The patient was advised to call immediately if she has any concerning symptoms in the interval. The patient voices understanding of current disease status and treatment options and is in agreement with the current care plan.  All questions were answered. The patient knows to call the clinic with any problems, questions or concerns. We can certainly see the patient much sooner if necessary.  The total time spent in the  appointment was 20 minutes.  Disclaimer: This note was dictated with voice recognition software. Similar sounding words can inadvertently be transcribed and may not be corrected upon review.

## 2024-02-08 ENCOUNTER — Telehealth: Payer: Self-pay

## 2024-02-08 NOTE — Telephone Encounter (Signed)
 Dr. Sherrod, patient will be scheduled as soon as possible.  Auth Submission: NO AUTH NEEDED Site of care: Site of care: [facility] Payer: [employer] medicare Medication & CPT/J Code(s) submitted: Venofer  (Iron  Sucrose) J1756 Diagnosis Code:  Route of submission (phone, fax, portal):  Phone # Fax # Auth type: Buy/Bill PB Units/visits requested: 200mg  x 3 doses Reference number:  Approval from: 02/08/24 to 06/09/24

## 2024-02-13 ENCOUNTER — Ambulatory Visit

## 2024-02-13 VITALS — BP 127/66 | HR 71 | Temp 98.7°F | Resp 18 | Ht 66.0 in | Wt 229.6 lb

## 2024-02-13 DIAGNOSIS — D509 Iron deficiency anemia, unspecified: Secondary | ICD-10-CM

## 2024-02-13 MED ORDER — IRON SUCROSE 20 MG/ML IV SOLN
200.0000 mg | INTRAVENOUS | Status: DC
  Administered 2024-02-13 (×2): 200 mg via INTRAVENOUS
  Filled 2024-02-13: qty 10

## 2024-02-13 NOTE — Patient Instructions (Signed)

## 2024-02-13 NOTE — Progress Notes (Signed)
 Diagnosis: Iron Deficiency Anemia  Provider:  Praveen Mannam MD  Procedure: IV Push  IV Type: Peripheral, IV Location: L Forearm  Venofer (Iron Sucrose), Dose: 200 mg  Post Infusion IV Care: Observation period completed and Peripheral IV Discontinued  Discharge: Condition: Good, Destination: Home . AVS Declined  Performed by:  Cynthya Yam, RN

## 2024-02-14 DIAGNOSIS — M5416 Radiculopathy, lumbar region: Secondary | ICD-10-CM | POA: Diagnosis not present

## 2024-02-20 ENCOUNTER — Ambulatory Visit (INDEPENDENT_AMBULATORY_CARE_PROVIDER_SITE_OTHER)

## 2024-02-20 VITALS — BP 129/58 | HR 87 | Temp 98.9°F | Resp 18 | Ht 66.0 in | Wt 227.6 lb

## 2024-02-20 DIAGNOSIS — D509 Iron deficiency anemia, unspecified: Secondary | ICD-10-CM

## 2024-02-20 MED ORDER — IRON SUCROSE 20 MG/ML IV SOLN
200.0000 mg | INTRAVENOUS | Status: DC
  Administered 2024-02-20: 200 mg via INTRAVENOUS
  Filled 2024-02-20: qty 10

## 2024-02-20 NOTE — Progress Notes (Signed)
 Diagnosis: Iron  Deficiency Anemia  Provider:  Praveen Mannam MD  Procedure: IV Push  IV Type: Peripheral, IV Location: L Antecubital  Venofer  (Iron  Sucrose), Dose: 200 mg  Post Infusion IV Care: Observation period completed and Peripheral IV Discontinued  Discharge: Condition: Good, Destination: Home . AVS Declined  Performed by:  Maximiano JONELLE Pouch, LPN

## 2024-02-27 ENCOUNTER — Ambulatory Visit

## 2024-02-27 VITALS — BP 138/77 | HR 80 | Temp 97.9°F | Resp 16 | Ht 60.5 in | Wt 228.6 lb

## 2024-02-27 DIAGNOSIS — D509 Iron deficiency anemia, unspecified: Secondary | ICD-10-CM

## 2024-02-27 MED ORDER — SODIUM CHLORIDE 0.9 % IV BOLUS
250.0000 mL | Freq: Once | INTRAVENOUS | Status: AC
  Administered 2024-02-27: 250 mL via INTRAVENOUS
  Filled 2024-02-27: qty 250

## 2024-02-27 MED ORDER — IRON SUCROSE 20 MG/ML IV SOLN
200.0000 mg | INTRAVENOUS | Status: DC
  Administered 2024-02-27: 200 mg via INTRAVENOUS
  Filled 2024-02-27: qty 10

## 2024-02-27 NOTE — Progress Notes (Signed)
 Diagnosis: Iron Deficiency Anemia  Provider:  Chilton Greathouse MD  Procedure: IV Push  IV Type: Peripheral, IV Location: L Forearm  Venofer (Iron Sucrose), Dose: 200 mg  Post Infusion IV Care: Observation period completed and Peripheral IV Discontinued. 15 minute observation per patient request.  Discharge: Condition: Stable, Destination: Home . AVS Declined  Performed by:  Wyvonne Lenz, RN

## 2024-03-05 DIAGNOSIS — G4733 Obstructive sleep apnea (adult) (pediatric): Secondary | ICD-10-CM | POA: Diagnosis not present

## 2024-03-12 ENCOUNTER — Ambulatory Visit: Admitting: Podiatry

## 2024-03-12 DIAGNOSIS — B351 Tinea unguium: Secondary | ICD-10-CM | POA: Diagnosis not present

## 2024-03-12 DIAGNOSIS — M79672 Pain in left foot: Secondary | ICD-10-CM

## 2024-03-12 DIAGNOSIS — M79671 Pain in right foot: Secondary | ICD-10-CM

## 2024-03-12 NOTE — Progress Notes (Signed)
 Patient presents for evaluation and treatment of tenderness and some redness around nails feet.  Tenderness around toes with walking and wearing shoes.  Physical exam:  General appearance: Alert, pleasant, and in no acute distress.  Vascular: Pedal pulses: DP 2/4 B/L, PT 0/4 B/L. Moderate edema lower legs bilaterally  Neu  Dermatologic:  Nails thickened, disfigured, discolored 1-5 BL with subungual debris.  Redness and hypertrophic nail folds along nail folds bilaterally but no signs of drainage or infection.  Musculoskeletal:     Diagnosis: 1. Painful onychomycotic nails 1 through 5 bilaterally. 2. Pain toes 1 through 5 bilaterally.  Plan: -Debrided onychomycotic nails 1 through 5 bilaterally.  Sharply debrided nails with nail clipper and reduced with a power bur. -Small scratch incidentally made dorsal aspect second toe from the clipper left.  Small amount of bleeding.  Applied antibiotic ointment and a light dressing recommended he clean the area with some soapy water once a day and put antibiotic ointment on it for another week.  If it does not heal or he has problems call us   Return 3 months RFC

## 2024-03-29 ENCOUNTER — Other Ambulatory Visit: Payer: Self-pay | Admitting: Internal Medicine

## 2024-03-29 DIAGNOSIS — F411 Generalized anxiety disorder: Secondary | ICD-10-CM

## 2024-03-29 DIAGNOSIS — G4700 Insomnia, unspecified: Secondary | ICD-10-CM

## 2024-03-29 NOTE — Telephone Encounter (Signed)
 Copied from CRM [id number]. Topic: Clinical - Medication Refill >> Mar 29, 2024  4:27 PM Chiquita SQUIBB wrote: Medication: clonazePAM  clonazePAM  (KLONOPIN ) 0.5 MG tablet   Has the patient contacted their pharmacy? Yes (Agent: If no, request that the patient contact the pharmacy for the refill. If patient does not wish to contact the pharmacy document the reason why and proceed with request.) (Agent: If yes, when and what did the pharmacy advise?)  This is the patient's preferred pharmacy:   [facility] [phone] [address], [place] - [address] [place] [address] Phone: [phone] Fax: [phone]  Is this the correct pharmacy for this prescription? Yes If no, delete pharmacy and type the correct one.   Has the prescription been filled recently? No  Is the patient out of the medication? No  Has the patient been seen for an appointment in the last year OR does the patient have an upcoming appointment? Yes  Can we respond through MyChart? Yes  Agent: Please be advised that Rx refills may take up to 3 business days. We ask that you follow-up with your pharmacy.

## 2024-04-01 DIAGNOSIS — M5416 Radiculopathy, lumbar region: Secondary | ICD-10-CM | POA: Diagnosis not present

## 2024-04-01 MED ORDER — CLONAZEPAM 0.5 MG PO TABS: 0.5000 mg | ORAL_TABLET | Freq: Two times a day (BID) | ORAL | 0 refills | Status: DC | PRN

## 2024-04-04 ENCOUNTER — Encounter: Payer: Self-pay | Admitting: Internal Medicine

## 2024-04-05 ENCOUNTER — Ambulatory Visit: Admitting: Family Medicine

## 2024-04-05 ENCOUNTER — Encounter: Payer: Self-pay | Admitting: Family Medicine

## 2024-04-05 VITALS — BP 140/70 | HR 60 | Temp 98.5°F | Ht 60.05 in | Wt 231.5 lb

## 2024-04-05 DIAGNOSIS — R1084 Generalized abdominal pain: Secondary | ICD-10-CM

## 2024-04-05 DIAGNOSIS — I10 Essential (primary) hypertension: Secondary | ICD-10-CM | POA: Diagnosis not present

## 2024-04-05 DIAGNOSIS — R251 Tremor, unspecified: Secondary | ICD-10-CM

## 2024-04-05 DIAGNOSIS — K921 Melena: Secondary | ICD-10-CM

## 2024-04-05 DIAGNOSIS — K31819 Angiodysplasia of stomach and duodenum without bleeding: Secondary | ICD-10-CM | POA: Diagnosis not present

## 2024-04-05 LAB — CBC WITH DIFFERENTIAL/PLATELET
Basophils Absolute: 0 K/uL (ref 0.0–0.1)
Basophils Relative: 0.7 % (ref 0.0–3.0)
Eosinophils Absolute: 0.3 K/uL (ref 0.0–0.7)
Eosinophils Relative: 4.6 % (ref 0.0–5.0)
HCT: 33.1 % — ABNORMAL LOW (ref 39.0–52.0)
Hemoglobin: 11.2 g/dL — ABNORMAL LOW (ref 13.0–17.0)
Lymphocytes Relative: 15.3 % (ref 12.0–46.0)
Lymphs Abs: 1 K/uL (ref 0.7–4.0)
MCHC: 33.8 g/dL (ref 30.0–36.0)
MCV: 89.4 fl (ref 78.0–100.0)
Monocytes Absolute: 0.7 K/uL (ref 0.1–1.0)
Monocytes Relative: 10.7 % (ref 3.0–12.0)
Neutro Abs: 4.5 K/uL (ref 1.4–7.7)
Neutrophils Relative %: 68.7 % (ref 43.0–77.0)
Platelets: 185 K/uL (ref 150.0–400.0)
RBC: 3.7 Mil/uL — ABNORMAL LOW (ref 4.22–5.81)
RDW: 13.4 % (ref 11.5–15.5)
WBC: 6.6 K/uL (ref 4.0–10.5)

## 2024-04-05 LAB — COMPREHENSIVE METABOLIC PANEL WITH GFR
ALT: 18 U/L (ref 0–53)
AST: 18 U/L (ref 0–37)
Albumin: 3.9 g/dL (ref 3.5–5.2)
Alkaline Phosphatase: 68 U/L (ref 39–117)
BUN: 23 mg/dL (ref 6–23)
CO2: 28 meq/L (ref 19–32)
Calcium: 9.3 mg/dL (ref 8.4–10.5)
Chloride: 104 meq/L (ref 96–112)
Creatinine, Ser: 1.11 mg/dL (ref 0.40–1.50)
GFR: 63.68 mL/min (ref >60.00)
Glucose, Bld: 96 mg/dL (ref 70–99)
Potassium: 4.4 meq/L (ref 3.5–5.1)
Sodium: 139 meq/L (ref 135–145)
Total Bilirubin: 0.4 mg/dL (ref 0.2–1.2)
Total Protein: 6.8 g/dL (ref 6.0–8.3)

## 2024-04-05 NOTE — Progress Notes (Signed)
 Acute Office Visit  Subjective:     Patient ID: Aaron [name], male    DOB: 04/30/1946, 78 y.o.   MRN: [id number]  Chief Complaint  Patient presents with   Acute Visit    Dark colored stools, ongoing for about 10 days. Denies seeing blood, SOB, palpitations.    HPI  Discussed the use of AI scribe software for clinical note transcription with the patient, who gave verbal consent to proceed.  History of Present Illness Aaron [name] is a 78 year old male with a history of gastrointestinal bleeding who presents with abdominal pain and dark tarry stools.  He is companied by his wife today who is supplementing history  Gastrointestinal bleeding and melena - Dark, tarry stools present for the past week - Stools are mostly solid, differing from a previous episode in 2023 when he was loose - Recent episode of loose stools attributed to dietary causes - History of gastrointestinal bleeding - Previously on blood thinners for ten years until 2023; currently takes 1 mg of aspirin  daily - Last iron  infusion was February 07, 2024  Abdominal discomfort - Abdominal discomfort described as a growling sensation, similar to hunger, without actual pain - No mention of associated nausea, vomiting, or other gastrointestinal symptoms - Currently resolved  Chronic anemia and associated symptoms - Concern for anemia due to recent symptoms and history - Increased shakiness in the right hand - Headaches initially thought to be sinus-related, but not resembling typical sinus headaches - Iron  infusion in August 2025 for low iron  levels; scheduled for another infusion soon - Significant episode of anemia in 2023 requiring assistance to walk and resulting in collapse without loss of consciousness  Antihypertensive and other medications - Takes losartan  100 mg in the morning and Nasoflow at night for blood pressure management     ROS Per HPI      Objective:    BP (!) 140/70 (BP Location: Left  Arm, Patient Position: Sitting)   Pulse 60   Temp 98.5 F (36.9 C) (Temporal)   Ht 5' 0.05 (1.525 m)   Wt 231 lb 8 oz (105 kg)   SpO2 96%   BMI 45.14 kg/m    Physical Exam Vitals and nursing note reviewed.  Constitutional:      General: He is not in acute distress.    Comments: Chronically ill-appearing  HENT:     Head: Normocephalic and atraumatic.     Right Ear: External ear normal.     Left Ear: External ear normal.     Nose: Nose normal.     Mouth/Throat:     Mouth: Mucous membranes are moist.     Pharynx: Oropharynx is clear.  Eyes:     Extraocular Movements: Extraocular movements intact.  Cardiovascular:     Rate and Rhythm: Normal rate and regular rhythm.     Pulses: Normal pulses.     Heart sounds: Normal heart sounds.  Pulmonary:     Effort: Pulmonary effort is normal. No respiratory distress.     Breath sounds: Normal breath sounds. No wheezing, rhonchi or rales.  Musculoskeletal:     Cervical back: Normal range of motion.     Right lower leg: No edema.     Left lower leg: No edema.     Comments: Using single-point cane for ambulation assistance  Lymphadenopathy:     Cervical: No cervical adenopathy.  Skin:    General: Skin is warm and dry.  Neurological:  General: No focal deficit present.     Mental Status: He is alert and oriented to person, place, and time.  Psychiatric:        Mood and Affect: Mood normal.        Behavior: Behavior normal.     Results for orders placed or performed in visit on 04/05/24  CBC with Differential/Platelet  Result Value Ref Range   WBC 6.6 4.0 - 10.5 K/uL   RBC 3.70 (L) 4.22 - 5.81 Mil/uL   Hemoglobin 11.2 (L) 13.0 - 17.0 g/dL   HCT 66.8 (L) 60.9 - 47.9 %   MCV 89.4 78.0 - 100.0 fl   MCHC 33.8 30.0 - 36.0 g/dL   RDW 86.5 88.4 - 84.4 %   Platelets 185.0 150.0 - 400.0 K/uL   Neutrophils Relative % 68.7 43.0 - 77.0 %   Lymphocytes Relative 15.3 12.0 - 46.0 %   Monocytes Relative 10.7 3.0 - 12.0 %    Eosinophils Relative 4.6 0.0 - 5.0 %   Basophils Relative 0.7 0.0 - 3.0 %   Neutro Abs 4.5 1.4 - 7.7 K/uL   Lymphs Abs 1.0 0.7 - 4.0 K/uL   Monocytes Absolute 0.7 0.1 - 1.0 K/uL   Eosinophils Absolute 0.3 0.0 - 0.7 K/uL   Basophils Absolute 0.0 0.0 - 0.1 K/uL  Comprehensive metabolic panel with GFR  Result Value Ref Range   Sodium 139 135 - 145 mEq/L   Potassium 4.4 3.5 - 5.1 mEq/L   Chloride 104 96 - 112 mEq/L   CO2 28 19 - 32 mEq/L   Glucose, Bld 96 70 - 99 mg/dL   BUN 23 6 - 23 mg/dL   Creatinine, Ser 8.88 0.40 - 1.50 mg/dL   Total Bilirubin 0.4 0.2 - 1.2 mg/dL   Alkaline Phosphatase 68 39 - 117 U/L   AST 18 0 - 37 U/L   ALT 18 0 - 53 U/L   Total Protein 6.8 6.0 - 8.3 g/dL   Albumin 3.9 3.5 - 5.2 g/dL   GFR 36.31 >39.99 mL/min   Calcium  9.3 8.4 - 10.5 mg/dL        Assessment & Plan:   Assessment and Plan Assessment & Plan Melena, GAVE Dark tarry stools suggest gastrointestinal bleeding. History of gastric perforations and recent iron  infusions indicate ongoing iron  deficiency anemia. - Order lab tests for hemoglobin and iron  levels. - Provide stool card for occult blood testing. - Hx GAVE syndrome - f/u with GI   Abdominal pain No current abdominal pain. Previous symptoms linked to dietary causes.  Tremor, right hand Increased right hand tremor possibly linked to anemia.  Essential hypertension Controlled with losartan  and Nasoflow.     Orders Placed This Encounter  Procedures   CBC with Differential/Platelet    Release to patient:   Immediate [1]   Comprehensive metabolic panel with GFR    Release to patient:   Immediate [1]   POCT Occult Blood Stool     No orders of the defined types were placed in this encounter.   Return if symptoms worsen or fail to improve, for With specialists as scheduled.  Corean LITTIE Ku, FNP

## 2024-04-05 NOTE — Patient Instructions (Addendum)
 We are checking labs today, will be in contact with any results that require further attention.  Follow up with specialists as scheduled.   Follow-up with me for new or worsening symptoms.

## 2024-04-06 DIAGNOSIS — R1084 Generalized abdominal pain: Secondary | ICD-10-CM | POA: Insufficient documentation

## 2024-04-06 DIAGNOSIS — K921 Melena: Secondary | ICD-10-CM | POA: Insufficient documentation

## 2024-04-06 DIAGNOSIS — I10 Essential (primary) hypertension: Secondary | ICD-10-CM | POA: Insufficient documentation

## 2024-04-06 DIAGNOSIS — R251 Tremor, unspecified: Secondary | ICD-10-CM | POA: Insufficient documentation

## 2024-04-07 ENCOUNTER — Ambulatory Visit: Payer: Self-pay | Admitting: Family Medicine

## 2024-04-08 ENCOUNTER — Telehealth: Payer: Self-pay | Admitting: Physician Assistant

## 2024-04-08 DIAGNOSIS — K31819 Angiodysplasia of stomach and duodenum without bleeding: Secondary | ICD-10-CM

## 2024-04-08 DIAGNOSIS — D509 Iron deficiency anemia, unspecified: Secondary | ICD-10-CM

## 2024-04-08 NOTE — Telephone Encounter (Signed)
 Lm on vm for patient to return call

## 2024-04-08 NOTE — Telephone Encounter (Signed)
 Patient calling in regards to having abd pain. Please advise.

## 2024-04-09 NOTE — Telephone Encounter (Signed)
 Called and spoke with patient. Patient states that he went to PCP last week for evaluation of abdominal pain and black tarry stools. Patient last received iron  infusion in August. Patient reports that he does take an oral iron  tablet daily and knows that this can cause black/dark stools but he didn't notice how black they were until a couple of weeks ago. Patient has not taken any Pepto-Bismol. Patient does not report any BRBPR, nausea, or vomiting. Patient is not having much abdominal pain this morning but he has only been up a couple of hours. Appetite is normal, he is not eating the best because he does not feel like cooking. Patient states that he is not feeling the best, he is fatigued. Patient denies any chest pain or dizziness. I informed patient that his PCP noted that he was to complete stool cards, patient stated that he read that but did not have the cards. I asked patient if he had someone that could pick up hemoccult cards from our office and he does not. He stated that his wife can't drive. Patient is concerned. He states that he does not want to be back in the hospital like September 2023. I was able to move appt up to this Friday at 8:40 am. Patient states that he gets a little SOB when he is doing stuff around the house. I told patient that I would see if the provider had any recommendations prior to his appt. Patient was thankful for the return call.

## 2024-04-09 NOTE — Telephone Encounter (Signed)
 Called and spoke with patient regarding information. Patient will come in for STAT CBC tomorrow. Patient is aware that no appt is needed. I have reviewed ER precautions with patient. Patient confirmed that he is still taking Pantoprazole  40 mg daily. Patient has been advised that we will contact him with lab results. Otherwise, keep follow up appt for later this week and they will discuss setting up hospital EGD. Patient verbalized understanding and had no concerns at the end of the call.

## 2024-04-10 ENCOUNTER — Other Ambulatory Visit: Payer: Self-pay | Admitting: Internal Medicine

## 2024-04-10 ENCOUNTER — Ambulatory Visit: Payer: Self-pay | Admitting: Physician Assistant

## 2024-04-10 ENCOUNTER — Other Ambulatory Visit

## 2024-04-10 DIAGNOSIS — K31819 Angiodysplasia of stomach and duodenum without bleeding: Secondary | ICD-10-CM | POA: Diagnosis not present

## 2024-04-10 DIAGNOSIS — D509 Iron deficiency anemia, unspecified: Secondary | ICD-10-CM

## 2024-04-10 DIAGNOSIS — F32A Depression, unspecified: Secondary | ICD-10-CM

## 2024-04-10 DIAGNOSIS — F411 Generalized anxiety disorder: Secondary | ICD-10-CM

## 2024-04-10 LAB — CBC WITH DIFFERENTIAL/PLATELET
Basophils Absolute: 0.1 K/uL (ref 0.0–0.1)
Basophils Relative: 1.4 % (ref 0.0–3.0)
Eosinophils Absolute: 0.2 K/uL (ref 0.0–0.7)
Eosinophils Relative: 3.6 % (ref 0.0–5.0)
HCT: 32.8 % — ABNORMAL LOW (ref 39.0–52.0)
Hemoglobin: 11.1 g/dL — ABNORMAL LOW (ref 13.0–17.0)
Lymphocytes Relative: 18.7 % (ref 12.0–46.0)
Lymphs Abs: 1.2 K/uL (ref 0.7–4.0)
MCHC: 34 g/dL (ref 30.0–36.0)
MCV: 90.1 fl (ref 78.0–100.0)
Monocytes Absolute: 0.7 K/uL (ref 0.1–1.0)
Monocytes Relative: 10.9 % (ref 3.0–12.0)
Neutro Abs: 4.3 K/uL (ref 1.4–7.7)
Neutrophils Relative %: 65.4 % (ref 43.0–77.0)
Platelets: 206 K/uL (ref 150.0–400.0)
RBC: 3.64 Mil/uL — ABNORMAL LOW (ref 4.22–5.81)
RDW: 13.4 % (ref 11.5–15.5)
WBC: 6.7 K/uL (ref 4.0–10.5)

## 2024-04-11 NOTE — Progress Notes (Unsigned)
 04/11/2024 Aaron [name] [id number] 1945-10-13  Referring provider: Joshua Debby LITTIE, MD Primary GI doctor: Dr. Federico ( Dr. Eda)  ASSESSMENT AND PLAN:   Iron  deficiency anemia with history of GAVE EGD 03/24/2022 showed GAVE s/p APC and Hemospray 03/2022 s/p 3 units PRBC, off DOAC with IVC filter 08/11/2022 EGD normal esophagus, GAVE status post APC, normal duodenum 10/17/2022 EGD in the hospital with Dr. Eda for GAVE status post APC He has been off DOAC since 03/2022 with IVC filter, no further evidence of rebleeding, HGB stable.  No need for repeat EGD at this time, continue iron  and supportive care, recheck labs today, follow up with Dr. Gatha in Sept Monitor stools, call if any worsening anemia, melena, SOB, etc Follow up 6 months  GERD No symptoms Continue protonix  40 mg once daily, refilled one year  Ventricular fibrillation (HCC) During recent hospitalization with hemoglobin 4.8 had subsequent V-fib with cardiac arrest post defibrillation with ROSC 03/25/2022 cardiac catheterization mild to moderate obstructive disease within mid LAD stent 03/26/2022 EF 55-60%, normal valves 05/16/2022 follow up with Dr. [place] Felt secondary to anemia.  History of DVT s/p IVC filter Off Xarelto , on bASA Doing well, no CP, SOB  Chronic idiopathic constipation - Increase fiber/ water intake, decrease caffeine, increase activity level. -continue miralax    Patient Care Team: Joshua Debby LITTIE, MD as PCP - General (Internal Medicine) [place], Peter M, MD as PCP - Cardiology (Cardiology) Kristie Lamprey, MD as Consulting Physician (Gastroenterology) Duke, Jon Garre, PA as Physician Assistant (Cardiology) Octavia Bruckner, MD as Consulting Physician (Ophthalmology) Yvone Rush, MD as Consulting Physician (Orthopedic Surgery)  HISTORY OF PRESENT ILLNESS: 78 y.o. male with a past medical history of anxiety, hypertension, hyperlipidemia, coronary artery disease s/p DES to the  LAD 2005, V-fib arrest 03/2022 during hospitalization for acute GI bleeding Hgb 4.8, PE/DVTs status post IVC, IDA, obesity, obstructive sleep apnea, pre-diabetes, psoriasis, GERD and others listed below presents for melena  03/25/2022 cardiac catheterization mild to moderate obstructive disease within mid LAD stent EGD 03/24/2022 showed GAVE s/p APC and Hemospray Patient did have history of DVT off Xarelto  status post IVC filter with IR on 03/29/2022.  08/11/2022 EGD and colonoscopy for IDA with Dr. Eda Colonoscopy with good bowel prep showed 2 polyps 8 to 10 mm transverse colon, 6 mm polyp ascending colon, 2 mm cecum no repeat colonoscopy secondary to age EGD normal esophagus, GAVE status post APC, normal duodenum 10/17/2022 EGD in the hospital with Dr. Eda for GAVE status post APC And I saw the patient June 2024 for iron  deficiency anemia    He  reports that he quit smoking about 28 years ago. His smoking use included cigarettes. He started smoking about 53 years ago. He has a 37.5 pack-year smoking history. He has never used smokeless tobacco. He reports that he does not drink alcohol and does not use drugs.  Current Medications:   Current Outpatient Medications (Endocrine & Metabolic):    predniSONE  (DELTASONE ) 10 MG tablet, 3 tabs by mouth per day for 3 days,2tabs per day for 3 days,1tab per day for 3 days  Current Outpatient Medications (Cardiovascular):    atorvastatin  (LIPITOR) 80 MG tablet, Take 1 tablet (80 mg total) by mouth daily.   ezetimibe  (ZETIA ) 10 MG tablet, Take 1 tablet (10 mg total) by mouth daily.   furosemide  (LASIX ) 40 MG tablet, Take 1 tablet (40 mg total) by mouth daily.   hydrALAZINE  (APRESOLINE ) 25 MG tablet, Take 1 tablet (25 mg  total) by mouth 3 (three) times daily.   spironolactone  (ALDACTONE ) 25 MG tablet, Take 1 tablet (25 mg total) by mouth daily.  Current Outpatient Medications (Respiratory):    albuterol  (VENTOLIN  HFA) 108 (90 Base) MCG/ACT inhaler,  Inhale 2 puffs into the lungs.  Current Outpatient Medications (Analgesics):    aspirin  EC 81 MG tablet, Take 1 tablet (81 mg total) by mouth daily. Swallow whole.   traMADol  (ULTRAM ) 50 MG tablet, Take 2 tablets (100 mg total) by mouth every 8 (eight) hours.  Current Outpatient Medications (Hematological):    ferrous sulfate  325 (65 FE) MG EC tablet, TAKE 1 TABLET BY MOUTH DAILY WITH BREAKFAST. SCHEDULE AN APPOINTMENT FOR FURTHER REFILLS  Current Outpatient Medications (Other):    ascorbic acid (C 500/ROSE HIPS) 500 MG tablet, Take 500 mg by mouth daily.   Calcium  Carb-Cholecalciferol  (CALCIUM  600 + D PO), Take 1 tablet by mouth daily.   cholecalciferol  (VITAMIN D3) 25 MCG (1000 UNIT) tablet, Take 1,000 Units by mouth daily.   clonazePAM  (KLONOPIN ) 0.5 MG tablet, Take 1 tablet (0.5 mg total) by mouth 2 (two) times daily as needed for anxiety.   levofloxacin  (LEVAQUIN ) 500 MG tablet, Take 1 tablet (500 mg total) by mouth daily.   Multiple Vitamins-Minerals (PRESERVISION AREDS 2) CAPS, Take 1 capsule by mouth daily.   neomycin-bacitracin-polymyxin (NEOSPORIN) OINT, Apply 1 Application topically as needed for wound care.   pantoprazole  (PROTONIX ) 40 MG tablet, TAKE 1 TABLET BY MOUTH EVERY DAY   polyethylene glycol (MIRALAX  / GLYCOLAX ) 17 g packet, Take 17 g by mouth daily. With coffee   traZODone  (DESYREL ) 150 MG tablet, TAKE 1 TABLET BY MOUTH AT BEDTIME.   venlafaxine  XR (EFFEXOR -XR) 150 MG 24 hr capsule, TAKE 1 CAPSULE BY MOUTH DAILY WITH BREAKFAST.   White Petrolatum-Mineral Oil (GENTEAL TEARS NIGHT-TIME) OINT, Apply 1 Application to eye at bedtime as needed (dry eyes).  Medical History:  Past Medical History:  Diagnosis Date   Allergy    Anemia    @ birth   Anxiety    Asthma    Blood transfusion    @ birth   Blood transfusion without reported diagnosis Jan 01, 1946   Cancer [facility])    Head - skin cancer   Cataract    L eye   Clotting disorder Sept. 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25   Psoriasis    PTSD (post-traumatic stress disorder)    Pulmonary embolism (HCC) ~ 03/2006   bilaterally   Seasonal allergies    Shortness of breath 06/08/2011   w/exertion; that's why I'm here   Sleep apnea    Sleep apnea, obstructive    Umbilical hernia    Allergies:  Allergies  Allergen Reactions   Wellbutrin [Bupropion] Other (See Comments)    Hallucinations, sweating   Amoxicillin Palpitations     Surgical History:  He  has a past surgical history that includes spinal injections (2006-2007); carpel  tunnel release (Bilateral, ~ 06/2005/~ 08/2005); Coronary stent placement (02/13/2004); Inguinal hernia repair (Right, 1983); Inguinal hernia repair (Left, 1985); Tonsillectomy; Eye surgery; Eye surgery; Enucleation (Right); Eye surgery [phone]); Cardiac catheterization; Knee arthroscopy (~ 06/2005); Coronary angioplasty; Total knee arthroplasty (Left, 10/09/2015); Colonoscopy w/ biopsies and polypectomy; Multiple tooth extractions; Total knee arthroplasty (Right, 04/04/2016); LEFT HEART CATH AND CORONARY ANGIOGRAPHY (N/A, 03/25/2022); Esophagogastroduodenoscopy (egd) with propofol  (N/A, 03/24/2022); TEE without cardioversion (03/24/2022); Hot hemostasis (N/A, 03/24/2022); Hemostasis control (03/24/2022); Bubble study (03/24/2022); IR IVC FILTER PLMT / S&I /IMG GUID/MOD SED (03/29/2022); Esophagogastroduodenoscopy (egd) with propofol  (N/A, 08/11/2022); Colonoscopy with propofol  (N/A, 08/11/2022); Hot hemostasis (N/A, 08/11/2022); polypectomy (08/11/2022);  Esophagogastroduodenoscopy (egd) with propofol  (N/A, 10/17/2022); Hot hemostasis (N/A, 10/17/2022); biopsy (10/17/2022); Vasectomy (1978); and Joint replacement (2017). Family History:  His family history includes Asthma in his brother, father, and sister; Breast cancer in his sister; Cancer in his sister; Emphysema in his father; Liver cancer in his sister.  REVIEW OF SYSTEMS  : All other systems reviewed and negative except where noted in the History of Present Illness.  PHYSICAL EXAM: There were no vitals taken for this visit. General:   Pleasant, obese male in no acute distress Head:   Normocephalic and atraumatic. Eyes:  sclerae anicteric,conjunctive pink  Heart:   regular rate and rhythm Pulm:  Clear anteriorly; no wheezing Abdomen:   Soft, Obese AB, Active bowel sounds. No tenderness, No organomegaly appreciated. Rectal: Not evaluated Extremities:  With 1-2 edema. Msk: Symmetrical without gross deformities. Peripheral pulses intact.  Walks with cane unable to get on table. Neurologic:  Alert and  oriented x4;  No focal deficits.  Skin:   Dry and intact without significant lesions or rashes. Psychiatric:  Cooperative. Normal mood and affect.    Alan JONELLE Coombs, PA-C 12:10 PM

## 2024-04-12 ENCOUNTER — Encounter: Payer: Self-pay | Admitting: *Deleted

## 2024-04-12 ENCOUNTER — Encounter: Payer: Self-pay | Admitting: Physician Assistant

## 2024-04-12 ENCOUNTER — Ambulatory Visit: Payer: Self-pay | Admitting: Physician Assistant

## 2024-04-12 ENCOUNTER — Ambulatory Visit: Admitting: Physician Assistant

## 2024-04-12 ENCOUNTER — Other Ambulatory Visit (INDEPENDENT_AMBULATORY_CARE_PROVIDER_SITE_OTHER)

## 2024-04-12 DIAGNOSIS — K5904 Chronic idiopathic constipation: Secondary | ICD-10-CM | POA: Diagnosis not present

## 2024-04-12 DIAGNOSIS — I2510 Atherosclerotic heart disease of native coronary artery without angina pectoris: Secondary | ICD-10-CM | POA: Diagnosis not present

## 2024-04-12 DIAGNOSIS — D509 Iron deficiency anemia, unspecified: Secondary | ICD-10-CM

## 2024-04-12 DIAGNOSIS — Z860100 Personal history of colon polyps, unspecified: Secondary | ICD-10-CM | POA: Diagnosis not present

## 2024-04-12 DIAGNOSIS — K219 Gastro-esophageal reflux disease without esophagitis: Secondary | ICD-10-CM

## 2024-04-12 DIAGNOSIS — K31819 Angiodysplasia of stomach and duodenum without bleeding: Secondary | ICD-10-CM

## 2024-04-12 DIAGNOSIS — Z860101 Personal history of adenomatous and serrated colon polyps: Secondary | ICD-10-CM | POA: Diagnosis not present

## 2024-04-12 DIAGNOSIS — E538 Deficiency of other specified B group vitamins: Secondary | ICD-10-CM

## 2024-04-12 DIAGNOSIS — I824Y9 Acute embolism and thrombosis of unspecified deep veins of unspecified proximal lower extremity: Secondary | ICD-10-CM

## 2024-04-12 DIAGNOSIS — I4901 Ventricular fibrillation: Secondary | ICD-10-CM

## 2024-04-12 LAB — CBC WITH DIFFERENTIAL/PLATELET
Basophils Absolute: 0.1 K/uL (ref 0.0–0.1)
Basophils Relative: 1.1 % (ref 0.0–3.0)
Eosinophils Absolute: 0.3 K/uL (ref 0.0–0.7)
Eosinophils Relative: 4 % (ref 0.0–5.0)
HCT: 34 % — ABNORMAL LOW (ref 39.0–52.0)
Hemoglobin: 11.5 g/dL — ABNORMAL LOW (ref 13.0–17.0)
Lymphocytes Relative: 17.3 % (ref 12.0–46.0)
Lymphs Abs: 1.2 K/uL (ref 0.7–4.0)
MCHC: 33.8 g/dL (ref 30.0–36.0)
MCV: 90 fl (ref 78.0–100.0)
Monocytes Absolute: 0.8 K/uL (ref 0.1–1.0)
Monocytes Relative: 10.9 % (ref 3.0–12.0)
Neutro Abs: 4.8 K/uL (ref 1.4–7.7)
Neutrophils Relative %: 66.7 % (ref 43.0–77.0)
Platelets: 218 K/uL (ref 150.0–400.0)
RBC: 3.78 Mil/uL — ABNORMAL LOW (ref 4.22–5.81)
RDW: 13.6 % (ref 11.5–15.5)
WBC: 7.2 K/uL (ref 4.0–10.5)

## 2024-04-12 LAB — IBC + FERRITIN
Ferritin: 320.3 ng/mL (ref 22.0–322.0)
Iron: 102 ug/dL (ref 42–165)
Saturation Ratios: 42.4 % (ref 20.0–50.0)
TIBC: 240.8 ug/dL — ABNORMAL LOW (ref 250.0–450.0)
Transferrin: 172 mg/dL — ABNORMAL LOW (ref 212.0–360.0)

## 2024-04-12 NOTE — Patient Instructions (Addendum)
 Your provider has requested that you go to the basement level for lab work before leaving today. Press B on the elevator. The lab is located at the first door on the left as you exit the elevator.  VISIT SUMMARY:  During your visit, we discussed your ongoing issues with black stools and anemia, which are related to your gastric ectasia. We reviewed your current medications and recent medical history, including your iron  supplementation and past treatments. We also addressed your hiatal hernia and coronary artery disease, considering the potential risks associated with your aspirin  therapy.  YOUR PLAN:  -GASTRIC ANTRAL VASCULAR ECTASIA (WATERMELON STOMACH) WITH RECURRENT GASTROINTESTINAL BLEEDING AND IRON  DEFICIENCY ANEMIA: Gastric antral vascular ectasia (GAVE) is a condition where the stomach lining has abnormal blood vessels that can cause bleeding. We will schedule an endoscopy to check for active bleeding and manage your condition. We will also repeat your complete blood count (CBC) and iron  studies to monitor your anemia. Continue taking your oral iron  supplements, and seek immediate medical attention if you notice any signs of active bleeding. We will discuss the necessity of your aspirin  therapy with your cardiologist due to the bleeding risk.  -HIATAL HERNIA: A hiatal hernia occurs when part of the stomach pushes up through the diaphragm. To manage your symptoms, we recommend lifestyle modifications and encourage you to continue walking as tolerated.  -CORONARY ARTERY DISEASE, POST-CARDIAC CATHETERIZATION: Coronary artery disease involves the narrowing or blockage of the coronary arteries. Given your history of anemia-related cardiac events, we will discuss with your cardiologist whether you should continue taking aspirin . Seek immediate medical attention if you experience any cardiac symptoms.  INSTRUCTIONS:  We will schedule an endoscopy to evaluate for active bleeding and manage your  GAVE. Please repeat your CBC and iron  studies as discussed. Continue your oral iron  supplementation. Discuss the necessity of your aspirin  therapy with your cardiologist. Seek immediate medical attention if you notice any signs of active bleeding or experience any cardiac symptoms.  We will contact you about scheduling your hospital procedure  Due to recent changes in healthcare laws, you may see the results of your imaging and laboratory studies on MyChart before your provider has had a chance to review them.  We understand that in some cases there may be results that are confusing or concerning to you. Not all laboratory results come back in the same time frame and the provider may be waiting for multiple results in order to interpret others.  Please give us  48 hours in order for your provider to thoroughly review all the results before contacting the office for clarification of your results.    I appreciate the  opportunity to care for you  Thank You   Kavitha Nandigam , MD

## 2024-04-12 NOTE — Addendum Note (Signed)
 Addended by: BETTIE GRAYCE HERO on: 04/12/2024 12:29 PM   Modules accepted: Orders

## 2024-04-12 NOTE — Addendum Note (Signed)
 Addended by: BETTIE GRAYCE HERO on: 04/12/2024 12:33 PM   Modules accepted: Orders

## 2024-04-17 ENCOUNTER — Ambulatory Visit: Admitting: Gastroenterology

## 2024-05-09 ENCOUNTER — Inpatient Hospital Stay: Attending: Internal Medicine

## 2024-05-09 ENCOUNTER — Inpatient Hospital Stay: Admitting: Internal Medicine

## 2024-05-09 VITALS — BP 150/55 | HR 72 | Temp 98.2°F | Resp 17 | Ht 63.5 in | Wt 246.0 lb

## 2024-05-09 DIAGNOSIS — I10 Essential (primary) hypertension: Secondary | ICD-10-CM | POA: Diagnosis not present

## 2024-05-09 DIAGNOSIS — K921 Melena: Secondary | ICD-10-CM | POA: Insufficient documentation

## 2024-05-09 DIAGNOSIS — K922 Gastrointestinal hemorrhage, unspecified: Secondary | ICD-10-CM | POA: Diagnosis not present

## 2024-05-09 DIAGNOSIS — D500 Iron deficiency anemia secondary to blood loss (chronic): Secondary | ICD-10-CM | POA: Diagnosis present

## 2024-05-09 DIAGNOSIS — D508 Other iron deficiency anemias: Secondary | ICD-10-CM

## 2024-05-09 LAB — FERRITIN: Ferritin: 497 ng/mL — ABNORMAL HIGH (ref 24–336)

## 2024-05-09 LAB — CBC WITH DIFFERENTIAL (CANCER CENTER ONLY)
Abs Immature Granulocytes: 0.04 K/uL (ref 0.00–0.07)
Basophils Absolute: 0.1 K/uL (ref 0.0–0.1)
Basophils Relative: 1 %
Eosinophils Absolute: 0.2 K/uL (ref 0.0–0.5)
Eosinophils Relative: 3 %
HCT: 33.8 % — ABNORMAL LOW (ref 39.0–52.0)
Hemoglobin: 11.8 g/dL — ABNORMAL LOW (ref 13.0–17.0)
Immature Granulocytes: 1 %
Lymphocytes Relative: 17 %
Lymphs Abs: 1.1 K/uL (ref 0.7–4.0)
MCH: 30.7 pg (ref 26.0–34.0)
MCHC: 34.9 g/dL (ref 30.0–36.0)
MCV: 88 fL (ref 80.0–100.0)
Monocytes Absolute: 0.7 K/uL (ref 0.1–1.0)
Monocytes Relative: 10 %
Neutro Abs: 4.7 K/uL (ref 1.7–7.7)
Neutrophils Relative %: 68 %
Platelet Count: 185 K/uL (ref 150–400)
RBC: 3.84 MIL/uL — ABNORMAL LOW (ref 4.22–5.81)
RDW: 12.2 % (ref 11.5–15.5)
WBC Count: 6.8 K/uL (ref 4.0–10.5)
nRBC: 0 % (ref 0.0–0.2)

## 2024-05-09 LAB — IRON AND IRON BINDING CAPACITY (CC-WL,HP ONLY)
Iron: 114 ug/dL (ref 45–182)
Saturation Ratios: 48 % — ABNORMAL HIGH (ref 17.9–39.5)
TIBC: 237 ug/dL — ABNORMAL LOW (ref 250–450)
UIBC: 123 ug/dL (ref 117–376)

## 2024-05-09 NOTE — Progress Notes (Signed)
 [facility] [phone]   [phone]  OFFICE PROGRESS NOTE  Joshua Debby CROME, MD [address] [place] [address]  DIAGNOSIS: Microcytic anemia secondary to iron  deficiency secondary to gastrointestinal hemorrhage.  PRIOR THERAPY: Iron  infusion with Venofer  300 Mg IV weekly on as-needed basis.  CURRENT THERAPY: Ferrous sulfate  325 mg p.o. daily.  INTERVAL HISTORY: Aaron [name] 78 y.o. male returns to the clinic today for follow-up visit accompanied by his wife. Discussed the use of AI scribe software for clinical note transcription with the patient, who gave verbal consent to proceed.  History of Present Illness Aaron [name] is a 78 year old male with microcytic anemia secondary to iron  deficiency who presents for evaluation and repeat blood work.  He feels 'a little slow' today and notes abnormal bowel movements. His stool was black, loose, and 'tarry like' this morning, similar to previous gastrointestinal bleeding episodes that required hospitalization in 2023.  He is scheduled for an upper and lower endoscopy on Monday, November 24th.  He is currently taking oral ferrous sulfate , 325 mg daily, along with vitamin C to aid absorption, and reports compliance with this regimen. His hemoglobin level is currently 11.0. He has a history of receiving iron  infusions with Venofer  in the past.  He mentions a past episode of pneumonia in July 2025 and notes that his blood pressure has been high, although he has not checked it recently at home.    MEDICAL HISTORY: Past Medical History:  Diagnosis Date   Allergy    Anemia    @ birth   Anxiety    Asthma    Blood transfusion    @ birth   Blood transfusion without reported diagnosis [birth year]   Cancer [facility])    Head - skin cancer   Cataract    L eye   Clotting disorder Sept. 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  MEDICATIONS:  Current Outpatient Medications  Medication Sig Dispense Refill   albuterol  (VENTOLIN  HFA) 108 (90 Base) MCG/ACT inhaler Inhale 2 puffs into the lungs.     ascorbic acid (C 500/ROSE HIPS) 500 MG tablet Take 500 mg by mouth daily.     aspirin  EC 81 MG tablet Take 1 tablet (81 mg total) by mouth daily. Swallow whole. 30 tablet 0   atorvastatin  (LIPITOR) 80 MG tablet Take 1 tablet (80 mg total) by mouth daily. 90 tablet 3   Calcium  Carb-Cholecalciferol  (CALCIUM  600 + D PO) Take 1 tablet by  mouth daily.     cholecalciferol  (VITAMIN D3) 25 MCG (1000 UNIT) tablet Take 1,000 Units by mouth daily.     clonazePAM  (KLONOPIN ) 0.5 MG tablet Take 1 tablet (0.5 mg total) by mouth 2 (two) times daily as needed for anxiety. 180 tablet 0   ezetimibe  (ZETIA ) 10 MG tablet Take 1 tablet (10 mg total) by mouth daily. 100 tablet 2   ferrous sulfate  325 (65 FE) MG EC tablet TAKE 1 TABLET BY MOUTH DAILY WITH BREAKFAST. SCHEDULE AN APPOINTMENT FOR FURTHER REFILLS 90 tablet 3   furosemide  (LASIX ) 40 MG tablet Take 1 tablet (40 mg total) by mouth daily. 90 tablet 3   hydrALAZINE  (APRESOLINE ) 25 MG tablet Take 1 tablet (25 mg total) by mouth 3 (three) times daily. 270 tablet 3   methocarbamol  (ROBAXIN ) 500 MG tablet Take 500 mg  by mouth daily as needed.     Multiple Vitamins-Minerals (PRESERVISION AREDS 2) CAPS Take 1 capsule by mouth daily.     neomycin-bacitracin-polymyxin (NEOSPORIN) OINT Apply 1 Application topically as needed for wound care.     pantoprazole  (PROTONIX ) 40 MG tablet TAKE 1 TABLET BY MOUTH EVERY DAY 90 tablet 1   polyethylene glycol (MIRALAX  / GLYCOLAX ) 17 g packet Take 17 g by mouth daily. With coffee     spironolactone  (ALDACTONE ) 25 MG tablet Take 1 tablet (25 mg total) by mouth daily. 90 tablet 3   traMADol  (ULTRAM ) 50 MG tablet Take 2 tablets (100 mg total) by mouth every 8 (eight) hours. 540 tablet 1   traZODone  (DESYREL ) 150 MG tablet TAKE 1 TABLET BY MOUTH AT BEDTIME. 90 tablet 1   venlafaxine  XR (EFFEXOR -XR) 150 MG 24 hr capsule TAKE 1 CAPSULE BY MOUTH DAILY WITH BREAKFAST. 100 capsule 0   White Petrolatum-Mineral Oil (GENTEAL TEARS NIGHT-TIME) OINT Apply 1 Application to eye at bedtime as needed (dry eyes).     No current facility-administered medications for this visit.    SURGICAL HISTORY:  Past Surgical History:  Procedure Laterality Date   BIOPSY  10/17/2022   Procedure: BIOPSY;  Surgeon: Eda Iha, MD;  Location: [facility] ENDOSCOPY;  Service: Gastroenterology;;   BUBBLE STUDY  03/24/2022   Procedure: BUBBLE STUDY;  Surgeon: Eda Iha, MD;  Location: [facility] ENDOSCOPY;  Service: Gastroenterology;;   CARDIAC CATHETERIZATION     carpel tunnel release Bilateral ~ 06/2005/~ 08/2005   right/left   COLONOSCOPY W/ BIOPSIES AND POLYPECTOMY     COLONOSCOPY WITH PROPOFOL  N/A 08/11/2022   Procedure: COLONOSCOPY WITH PROPOFOL ;  Surgeon: Eda Iha, MD;  Location: [facility] ENDOSCOPY;  Service: Gastroenterology;  Laterality: N/A;   CORONARY ANGIOPLASTY     CORONARY STENT PLACEMENT  02/13/2004   1   ENUCLEATION Right    age 32   ESOPHAGOGASTRODUODENOSCOPY (EGD) WITH PROPOFOL  N/A 03/24/2022   Procedure: ESOPHAGOGASTRODUODENOSCOPY (EGD) WITH PROPOFOL ;  Surgeon: Eda Iha, MD;   Location: [facility] ENDOSCOPY;  Service: Gastroenterology;  Laterality: N/A;   ESOPHAGOGASTRODUODENOSCOPY (EGD) WITH PROPOFOL  N/A 08/11/2022   Procedure: ESOPHAGOGASTRODUODENOSCOPY (EGD) WITH PROPOFOL ;  Surgeon: Eda Iha, MD;  Location: [facility] ENDOSCOPY;  Service: Gastroenterology;  Laterality: N/A;   ESOPHAGOGASTRODUODENOSCOPY (EGD) WITH PROPOFOL  N/A 10/17/2022   Procedure: ESOPHAGOGASTRODUODENOSCOPY (EGD) WITH PROPOFOL ;  Surgeon: Eda Iha, MD;  Location: [facility] ENDOSCOPY;  Service: Gastroenterology;  Laterality: N/A;   EYE SURGERY     age 51; ruptured  right pupil   EYE SURGERY     age 46   EYE SURGERY  1957   placed artificial right eye   HEMOSTASIS CONTROL  03/24/2022   Procedure: HEMOSTASIS CONTROL;  Surgeon: Eda Iha, MD;  Location: [facility] ENDOSCOPY;  Service: Gastroenterology;;   HOT HEMOSTASIS N/A 03/24/2022   Procedure: HOT HEMOSTASIS (ARGON PLASMA COAGULATION/BICAP);  Surgeon: Eda Iha, MD;  Location: [facility] ENDOSCOPY;  Service: Gastroenterology;  Laterality: N/A;   HOT HEMOSTASIS N/A 08/11/2022   Procedure: HOT HEMOSTASIS (ARGON PLASMA COAGULATION/BICAP);  Surgeon: Eda Iha, MD;  Location: [facility] ENDOSCOPY;  Service: Gastroenterology;  Laterality: N/A;  stomach   HOT HEMOSTASIS N/A 10/17/2022   Procedure: HOT HEMOSTASIS (ARGON PLASMA COAGULATION/BICAP);  Surgeon: Eda Iha, MD;  Location: [facility] ENDOSCOPY;  Service: Gastroenterology;  Laterality: N/A;   INGUINAL HERNIA REPAIR Right 1983   INGUINAL HERNIA REPAIR Left 1985   IR IVC FILTER PLMT / S&I /IMG GUID/MOD SED  03/29/2022   JOINT REPLACEMENT  2017   right & left  knee   KNEE ARTHROSCOPY  ~ 06/2005   right   LEFT HEART CATH AND CORONARY ANGIOGRAPHY N/A 03/25/2022   Procedure: LEFT HEART CATH AND CORONARY ANGIOGRAPHY;  Surgeon: Wendel Lurena POUR, MD;  Location: [facility] INVASIVE CV LAB;  Service: Cardiovascular;  Laterality: N/A;   MULTIPLE TOOTH EXTRACTIONS     POLYPECTOMY  08/11/2022   Procedure:  POLYPECTOMY;  Surgeon: Eda Iha, MD;  Location: [facility] ENDOSCOPY;  Service: Gastroenterology;;  colon   spinal injections  2006-2007   maybe 10 injections for herniated discs   TEE WITHOUT CARDIOVERSION  03/24/2022   Procedure: TRANSESOPHAGEAL ECHOCARDIOGRAM (TEE);  Surgeon: Eda Iha, MD;  Location[facility] ENDOSCOPY;  Service: Gastroenterology;;   TONSILLECTOMY     when I was real young   TOTAL KNEE ARTHROPLASTY Left 10/09/2015   Procedure: TOTAL KNEE ARTHROPLASTY;  Surgeon: Norleen Gavel, MD;  Location: [facility] OR;  Service: Orthopedics;  Laterality: Left;   TOTAL KNEE ARTHROPLASTY Right 04/04/2016   Procedure: TOTAL KNEE ARTHROPLASTY;  Surgeon: Norleen Gavel, MD;  Location: [facility] OR;  Service: Orthopedics;  Laterality: Right;   VASECTOMY  1978    REVIEW OF SYSTEMS:  A comprehensive review of systems was negative except for: Constitutional: positive for fatigue Respiratory: positive for dyspnea on exertion Gastrointestinal: positive for change in bowel habits and diarrhea   PHYSICAL EXAMINATION: General appearance: alert, cooperative, fatigued, and no distress Head: Normocephalic, without obvious abnormality, atraumatic Neck: no adenopathy, no JVD, supple, symmetrical, trachea midline, and thyroid  not enlarged, symmetric, no tenderness/mass/nodules Lymph nodes: Cervical, supraclavicular, and axillary nodes normal. Resp: clear to auscultation bilaterally Back: symmetric, no curvature. ROM normal. No CVA tenderness. Cardio: regular rate and rhythm, S1, S2 normal, no murmur, click, rub or gallop GI: soft, non-tender; bowel sounds normal; no masses,  no organomegaly Extremities: extremities normal, atraumatic, no cyanosis or edema  ECOG PERFORMANCE STATUS: 1 - Symptomatic but completely ambulatory  Blood pressure (!) 150/55, pulse 72, temperature 98.2 F (36.8 C), temperature source Temporal, resp. rate 17, height 5' 3.5 (1.613 m), weight 246 lb (111.6 kg), SpO2 97%.  LABORATORY  DATA: Lab Results  Component Value Date   WBC 6.8 05/09/2024   HGB 11.8 (L) 05/09/2024   HCT 33.8 (L) 05/09/2024   MCV 88.0 05/09/2024   PLT 185 05/09/2024      Chemistry      Component Value Date/Time   NA 139 04/05/2024 1525   NA 139 04/18/2022 1509   K 4.4 04/05/2024 1525   CL 104 04/05/2024 1525   CO2 28 04/05/2024 1525   BUN 23 04/05/2024 1525   BUN 24 04/18/2022 1509   CREATININE 1.11 04/05/2024 1525  CREATININE 1.24 06/15/2020 1306   CREATININE 1.16 02/10/2020 1532      Component Value Date/Time   CALCIUM  9.3 04/05/2024 1525   ALKPHOS 68 04/05/2024 1525   AST 18 04/05/2024 1525   AST 17 06/15/2020 1306   ALT 18 04/05/2024 1525   ALT 12 06/15/2020 1306   BILITOT 0.4 04/05/2024 1525   BILITOT 0.3 12/17/2020 1017   BILITOT 0.4 06/15/2020 1306       RADIOGRAPHIC STUDIES: No results found.   ASSESSMENT AND PLAN: This is a very pleasant 78 years old white male with history of iron  deficiency anemia secondary to chronic gastrointestinal blood loss. The patient is currently on oral iron  tablet with ferrous sulfate  325 mg p.o. daily and tolerating this treatment well. The patient was found recently to have significant iron  deficiency anemia. He has significant gastric antral vascular ectasia with acute blood loss few weeks ago. The patient received 3 units of PRBCs transfusion during his hospitalization. He was treated with iron  infusion with Venofer  at the [facility] infusion center and tolerated it fairly well. He is currently on ferrous sulfate  325 mg p.o. daily. Repeat CBC today showed hemoglobin of 11.8.  Iron  study and ferritin are still pending Assessment and Plan Assessment & Plan Iron  deficiency anemia secondary to gastrointestinal bleeding Microcytic anemia secondary to iron  deficiency from gastrointestinal hemorrhage. Hemoglobin is well-managed at 11.0, consistent with previous levels. Iron  levels pending. Currently on oral ferrous sulfate  325 mg daily  with vitamin C to enhance absorption. - Continue oral ferrous sulfate  325 mg daily with vitamin C. - Will arrange iron  infusion if iron  levels are low. - Will repeat blood work in three months.  Suspected gastrointestinal bleeding under evaluation Reports of black, tarry stools suggestive of gastrointestinal bleeding. Scheduled for upper and lower endoscopy on November 24th to evaluate for bleeding sources. Previous endoscopy revealed bleeding points in the small intestine. - Proceed with scheduled upper and lower endoscopy on November 24th. - Advised to go to the emergency room if unable to attend the scheduled procedure.  Essential hypertension Blood pressure remains elevated. No recent home monitoring reported. - Monitor blood pressure at home. - Will follow up in three months.   The patient voices understanding of current disease status and treatment options and is in agreement with the current care plan.  All questions were answered. The patient knows to call the clinic with any problems, questions or concerns. We can certainly see the patient much sooner if necessary.  The total time spent in the appointment was 20 minutes.  Disclaimer: This note was dictated with voice recognition software. Similar sounding words can inadvertently be transcribed and may not be corrected upon review.

## 2024-05-20 ENCOUNTER — Telehealth: Payer: Self-pay | Admitting: Gastroenterology

## 2024-05-20 ENCOUNTER — Encounter (HOSPITAL_COMMUNITY): Payer: Self-pay | Admitting: Gastroenterology

## 2024-05-20 NOTE — Progress Notes (Signed)
 Attempted to obtain medical history for pre op call via telephone, unable to reach at this time. HIPAA compliant voicemail message left requesting return call to pre surgical testing department.

## 2024-05-20 NOTE — Telephone Encounter (Addendum)
 Procedure:Endoscopy Procedure date: 05/27/24 Procedure location: [facility] Arrival Time: 9:30 am Spoke with the patient Y/N:   No, I left a detailed message on [phone] on 05/20/24 @ 11:287 am for the patient to return call  Yes 05/21/24 @ 1:13 pm   Any prep concerns? No  Has the patient obtained the prep from the pharmacy ? No prep needed Do you have a care partner and transportation: Yes Any additional concerns? No

## 2024-05-21 NOTE — Telephone Encounter (Signed)
 Requesting call back to discuss EGD details. Please review

## 2024-05-23 ENCOUNTER — Other Ambulatory Visit: Payer: Self-pay

## 2024-05-23 ENCOUNTER — Telehealth: Payer: Self-pay | Admitting: Internal Medicine

## 2024-05-23 DIAGNOSIS — G4700 Insomnia, unspecified: Secondary | ICD-10-CM

## 2024-05-23 DIAGNOSIS — F411 Generalized anxiety disorder: Secondary | ICD-10-CM

## 2024-05-23 DIAGNOSIS — F32A Depression, unspecified: Secondary | ICD-10-CM

## 2024-05-23 MED ORDER — TRAZODONE HCL 150 MG PO TABS: 150.0000 mg | ORAL_TABLET | Freq: Every day | ORAL | 0 refills | Status: AC

## 2024-05-23 NOTE — Telephone Encounter (Signed)
 Spoke with patient, F/U scheduled and refill sent in

## 2024-05-23 NOTE — Telephone Encounter (Unsigned)
 Copied from CRM #[id number]. Topic: Clinical - Medication Refill >> May 23, 2024 12:18 PM Harlene ORN wrote: Medication: traZODone  (DESYREL ) 150 MG tablet  Has the patient contacted their pharmacy? Yes (Agent: If no, request that the patient contact the pharmacy for the refill. If patient does not wish to contact the pharmacy document the reason why and proceed with request.) (Agent: If yes, when and what did the pharmacy advise?)  This is the patient's preferred pharmacy:  [id number] [address], [place] - [address] [place] [address] Phone: [phone] Fax: [phone]  Is this the correct pharmacy for this prescription? Yes If no, delete pharmacy and type the correct one.   Has the prescription been filled recently? No  Is the patient out of the medication? No  Has the patient been seen for an appointment in the last year OR does the patient have an upcoming appointment? Yes  Can we respond through MyChart? Yes  Agent: Please be advised that Rx refills may take up to 3 business days. We ask that you follow-up with your pharmacy.

## 2024-05-27 ENCOUNTER — Ambulatory Visit (HOSPITAL_COMMUNITY)

## 2024-05-27 ENCOUNTER — Ambulatory Visit (HOSPITAL_COMMUNITY)
Admission: RE | Admit: 2024-05-27 | Discharge: 2024-05-27 | Disposition: A | Discharge status: Home / Self Care | Attending: Gastroenterology | Admitting: Gastroenterology

## 2024-05-27 ENCOUNTER — Telehealth: Payer: Self-pay

## 2024-05-27 ENCOUNTER — Other Ambulatory Visit: Payer: Self-pay

## 2024-05-27 ENCOUNTER — Encounter (HOSPITAL_COMMUNITY)
Admission: RE | Disposition: A | Discharge status: Home / Self Care | Payer: Self-pay | Source: Home / Self Care | Attending: Gastroenterology

## 2024-05-27 DIAGNOSIS — K219 Gastro-esophageal reflux disease without esophagitis: Secondary | ICD-10-CM | POA: Insufficient documentation

## 2024-05-27 DIAGNOSIS — Z86718 Personal history of other venous thrombosis and embolism: Secondary | ICD-10-CM | POA: Diagnosis not present

## 2024-05-27 DIAGNOSIS — Z955 Presence of coronary angioplasty implant and graft: Secondary | ICD-10-CM | POA: Diagnosis not present

## 2024-05-27 DIAGNOSIS — K31811 Angiodysplasia of stomach and duodenum with bleeding: Secondary | ICD-10-CM | POA: Diagnosis not present

## 2024-05-27 DIAGNOSIS — G4733 Obstructive sleep apnea (adult) (pediatric): Secondary | ICD-10-CM | POA: Insufficient documentation

## 2024-05-27 DIAGNOSIS — K5904 Chronic idiopathic constipation: Secondary | ICD-10-CM | POA: Insufficient documentation

## 2024-05-27 DIAGNOSIS — I2510 Atherosclerotic heart disease of native coronary artery without angina pectoris: Secondary | ICD-10-CM | POA: Insufficient documentation

## 2024-05-27 DIAGNOSIS — I4901 Ventricular fibrillation: Secondary | ICD-10-CM | POA: Diagnosis not present

## 2024-05-27 DIAGNOSIS — J45909 Unspecified asthma, uncomplicated: Secondary | ICD-10-CM | POA: Insufficient documentation

## 2024-05-27 DIAGNOSIS — K31819 Angiodysplasia of stomach and duodenum without bleeding: Secondary | ICD-10-CM

## 2024-05-27 DIAGNOSIS — E538 Deficiency of other specified B group vitamins: Secondary | ICD-10-CM

## 2024-05-27 DIAGNOSIS — D500 Iron deficiency anemia secondary to blood loss (chronic): Secondary | ICD-10-CM

## 2024-05-27 DIAGNOSIS — I10 Essential (primary) hypertension: Secondary | ICD-10-CM | POA: Diagnosis not present

## 2024-05-27 DIAGNOSIS — Z87891 Personal history of nicotine dependence: Secondary | ICD-10-CM

## 2024-05-27 DIAGNOSIS — D509 Iron deficiency anemia, unspecified: Secondary | ICD-10-CM

## 2024-05-27 HISTORY — PX: GI RADIOFREQUENCY ABLATION: SHX6807

## 2024-05-27 HISTORY — PX: HOT HEMOSTASIS: SHX5433

## 2024-05-27 SURGERY — EGD, WITH ARGON PLASMA COAGULATION
Anesthesia: Monitor Anesthesia Care

## 2024-05-27 MED ORDER — PROPOFOL 10 MG/ML IV BOLUS
INTRAVENOUS | Status: DC | PRN
  Administered 2024-05-27 (×2): 20 mg via INTRAVENOUS

## 2024-05-27 MED ORDER — SODIUM CHLORIDE 0.9 % IV SOLN: INTRAVENOUS | Status: DC

## 2024-05-27 MED ORDER — LIDOCAINE 2% (20 MG/ML) 5 ML SYRINGE
INTRAMUSCULAR | Status: DC | PRN
  Administered 2024-05-27: 40 mg via INTRAVENOUS

## 2024-05-27 MED ORDER — PROPOFOL 500 MG/50ML IV EMUL
INTRAVENOUS | Status: DC | PRN
  Administered 2024-05-27: 180 ug/kg/min via INTRAVENOUS

## 2024-05-27 NOTE — Telephone Encounter (Signed)
 Copied from CRM #[id number]. Topic: Clinical - Order For Equipment >> May 24, 2024  1:55 PM Lavanda D wrote: Reason for CRM: Tawni with [facility] is calling to check the status on an rx request she has faxed over for replacement CPAP for Aaron [name]. Faxed over on 11/19, she will refax. Please sign & date + fax back or call for update if unable.  Fax #: [phone] CB: [phone]   Routing to Ashlyn as she has this form.

## 2024-05-27 NOTE — Transfer of Care (Signed)
 Immediate Anesthesia Transfer of Care Note  Patient: Aaron [name]  Procedure(s) Performed: EGD, WITH ARGON PLASMA COAGULATION RADIOFREQUENCY ABLATION, UPPER GI TRACT, ENDOSCOPIC  Patient Location: Endoscopy Unit  Anesthesia Type:MAC  Level of Consciousness: sedated  Airway & Oxygen Therapy: Patient Spontanous Breathing and Patient connected to face mask oxygen  Post-op Assessment: Report given to RN and Post -op Vital signs reviewed and stable  Post vital signs: Reviewed and stable  Last Vitals:  Vitals Value Taken Time  BP    Temp    Pulse    Resp    SpO2      Last Pain:  Vitals:   05/27/24 0929  TempSrc: Temporal  PainSc: 6          Complications: No notable events documented.

## 2024-05-27 NOTE — H&P (Signed)
 04/11/2024 Aaron [name] [id number] 1946-05-17   Referring provider: Joshua Debby LITTIE, MD Primary GI doctor: Dr. Federico ( Dr. Eda)   ASSESSMENT AND PLAN:    Iron  deficiency anemia with history of GAVE EGD 03/24/2022 showed GAVE s/p APC and Hemospray 03/2022 s/p 3 units PRBC, off DOAC with IVC filter 08/11/2022 EGD normal esophagus, GAVE status post APC, normal duodenum 10/17/2022 EGD in the hospital with Dr. Eda for GAVE status post APC He has been off DOAC since 03/2022 with IVC filter other than bASA Has had black tarry stools for the last 3 weeks, CBC shows HGB 11.2, 11.1 - cath with mild non obstructive plaque, consider stopping bASA -monitor CBC, will repeat CBC, iron  today - on oral iron  daily, continue this -follow up Dr. Gatha for supportive care, last saw 02/2024 and had 3 iron  infusions in August - will set up for EGD in the hospital for iron  def anemia with melena and history of GAVE, discussing to see if we can get this done next week, will update note with information.  I discussed risks of EGD with patient today, including risk of sedation, bleeding or perforation.  Patient provides understanding and gave verbal consent to proceed. - reassuring FOBT negative in the office - Advised to go to the ER if there is any severe weakness, severe abdominal pain, vomit blood, dark red blood in your bowel movement, shortness of breath or chest pain.    GERD No symptoms Continue protonix  40 mg once daily   Ventricular fibrillation (HCC) During recent hospitalization with hemoglobin 4.8 had subsequent V-fib with cardiac arrest post defibrillation with ROSC 03/25/2022 cardiac catheterization mild to moderate obstructive disease within mid LAD stent 03/26/2022 EF 55-60%, normal valves 05/16/2022 follow up with Dr. Jordan felt VF secondary to anemia. On bASA, consider stopping?   History of DVT s/p IVC filter Off Xarelto , on bASA Doing well, no CP, SOB   Chronic  idiopathic constipation - Increase fiber/ water intake, decrease caffeine, increase activity level. -continue miralax      Patient Care Team: Joshua Debby LITTIE, MD as PCP - General (Internal Medicine) Jordan, Peter M, MD as PCP - Cardiology (Cardiology) Kristie Lamprey, MD as Consulting Physician (Gastroenterology) Duke, Jon Garre, PA as Physician Assistant (Cardiology) Octavia Bruckner, MD as Consulting Physician (Ophthalmology) Yvone Rush, MD as Consulting Physician (Orthopedic Surgery)   HISTORY OF PRESENT ILLNESS: 78 y.o. male with a past medical history of anxiety, hypertension, hyperlipidemia, coronary artery disease s/p DES to the LAD 2005, V-fib arrest 03/2022 during hospitalization for acute GI bleeding Hgb 4.8, PE/DVTs status post IVC, IDA, obesity, obstructive sleep apnea, pre-diabetes, psoriasis, GERD and others listed below presents for melena   03/25/2022 cardiac catheterization mild to moderate obstructive disease within mid LAD stent EGD 03/24/2022 showed GAVE s/p APC and Hemospray Patient did have history of DVT off Xarelto  status post IVC filter with IR on 03/29/2022.  08/11/2022 EGD and colonoscopy for IDA with Dr. Eda Colonoscopy with good bowel prep showed 2 polyps 8 to 10 mm transverse colon, 6 mm polyp ascending colon, 2 mm cecum no repeat colonoscopy secondary to age EGD normal esophagus, GAVE status post APC, normal duodenum 10/17/2022 EGD in the hospital with Dr. Eda for GAVE status post APC And I saw the patient June 2024 for iron  deficiency anemia   Discussed the use of AI scribe software for clinical note transcription with the patient, who gave verbal consent to proceed.   History of Present Illness  Aaron [name] is a 78 year old male with gastric ectasia who presents with black stools and anemia.   He has a history of gastric ectasia, necessitating multiple endoscopies, the most recent in April 2024. For the past three weeks, he has experienced  black stools, initially attributing them to dietary causes. The stools are diarrhea-like and occasionally solid but consistently dark, with the most recent occurrence this morning.   He is on 81 mg of aspirin  daily since a hospital discharge in 2023 and continues to take pantoprazole  40 mg daily for gastric protection. He takes oral iron  supplements, sourced online due to cost concerns, which he has been on since before his hospital stay. He received iron  infusions in August on the 12th, 19th, and 26th. His hemoglobin levels have been monitored, with a recent drop from 12.1 to 11.2 and 2 days ago at 11.1.   He experiences occasional shortness of breath but no chest pain. He does not take NSAIDs or pain medications like Aleve or ibuprofen but uses tramadol  for pain management. No alcohol use.   In July, he and his wife were treated for pneumonia with antibiotics and oral steroids. He has experienced stomach pain when leaning over, and his stomach has been growling more frequently, even when not hungry.   He recalls a past incident of severe anemia leading to a heart rhythm issue, resolved with treatment. He has a history of a cardiac catheterization in 2023, which showed mild to moderate plaque but no significant obstruction.       He  reports that he quit smoking about 28 years ago. His smoking use included cigarettes. He started smoking about 53 years ago. He has a 37.5 pack-year smoking history. He has never used smokeless tobacco. He reports that he does not drink alcohol and does not use drugs.   Current Medications:    Current Outpatient Medications (Endocrine & Metabolic):    predniSONE  (DELTASONE ) 10 MG tablet, 3 tabs by mouth per day for 3 days,2tabs per day for 3 days,1tab per day for 3 days   Current Outpatient Medications (Cardiovascular):    atorvastatin  (LIPITOR) 80 MG tablet, Take 1 tablet (80 mg total) by mouth daily.   ezetimibe  (ZETIA ) 10 MG tablet, Take 1 tablet (10 mg total) by  mouth daily.   furosemide  (LASIX ) 40 MG tablet, Take 1 tablet (40 mg total) by mouth daily.   hydrALAZINE  (APRESOLINE ) 25 MG tablet, Take 1 tablet (25 mg total) by mouth 3 (three) times daily.   spironolactone  (ALDACTONE ) 25 MG tablet, Take 1 tablet (25 mg total) by mouth daily.   Current Outpatient Medications (Respiratory):    albuterol  (VENTOLIN  HFA) 108 (90 Base) MCG/ACT inhaler, Inhale 2 puffs into the lungs.   Current Outpatient Medications (Analgesics):    aspirin  EC 81 MG tablet, Take 1 tablet (81 mg total) by mouth daily. Swallow whole.   traMADol  (ULTRAM ) 50 MG tablet, Take 2 tablets (100 mg total) by mouth every 8 (eight) hours.   Current Outpatient Medications (Hematological):    ferrous sulfate  325 (65 FE) MG EC tablet, TAKE 1 TABLET BY MOUTH DAILY WITH BREAKFAST. SCHEDULE AN APPOINTMENT FOR FURTHER REFILLS   Current Outpatient Medications (Other):    ascorbic acid (C 500/ROSE HIPS) 500 MG tablet, Take 500 mg by mouth daily.   Calcium  Carb-Cholecalciferol  (CALCIUM  600 + D PO), Take 1 tablet by mouth daily.   cholecalciferol  (VITAMIN D3) 25 MCG (1000 UNIT) tablet, Take 1,000 Units by mouth daily.  clonazePAM  (KLONOPIN ) 0.5 MG tablet, Take 1 tablet (0.5 mg total) by mouth 2 (two) times daily as needed for anxiety.   levofloxacin  (LEVAQUIN ) 500 MG tablet, Take 1 tablet (500 mg total) by mouth daily.   Multiple Vitamins-Minerals (PRESERVISION AREDS 2) CAPS, Take 1 capsule by mouth daily.   neomycin-bacitracin-polymyxin (NEOSPORIN) OINT, Apply 1 Application topically as needed for wound care.   pantoprazole  (PROTONIX ) 40 MG tablet, TAKE 1 TABLET BY MOUTH EVERY DAY   polyethylene glycol (MIRALAX  / GLYCOLAX ) 17 g packet, Take 17 g by mouth daily. With coffee   traZODone  (DESYREL ) 150 MG tablet, TAKE 1 TABLET BY MOUTH AT BEDTIME.   venlafaxine  XR (EFFEXOR -XR) 150 MG 24 hr capsule, TAKE 1 CAPSULE BY MOUTH DAILY WITH BREAKFAST.   White Petrolatum-Mineral Oil (GENTEAL TEARS NIGHT-TIME)  OINT, Apply 1 Application to eye at bedtime as needed (dry eyes).   Medical History:      Past Medical History:  Diagnosis Date   Allergy     Anemia      @ birth   Anxiety     Asthma     Blood transfusion      @ birth   Blood transfusion without reported diagnosis 11/04/1945   Cancer [facility])      Head - skin cancer   Cataract      L eye   Clotting disorder Sept. 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36   Psoriasis     PTSD (post-traumatic stress disorder)     Pulmonary embolism (HCC) ~ 03/2006    bilaterally   Seasonal allergies     Shortness of breath 06/08/2011    w/exertion; that's why I'm here   Sleep apnea     Sleep apnea, obstructive     Umbilical hernia          Allergies:  Allergies       Allergies  Allergen Reactions   Wellbutrin [Bupropion] Other (See Comments)      Hallucinations, sweating   Amoxicillin Palpitations        Surgical History:  He  has a past surgical history that includes spinal injections (2006-2007); carpel tunnel release (Bilateral, ~ 06/2005/~ 08/2005); Coronary stent placement (02/13/2004); Inguinal hernia repair (Right, 1983); Inguinal hernia repair (Left, 1985); Tonsillectomy; Eye surgery; Eye surgery; Enucleation (Right); Eye surgery [phone]); Cardiac catheterization; Knee arthroscopy (~ 06/2005); Coronary angioplasty; Total knee arthroplasty (Left, 10/09/2015); Colonoscopy w/ biopsies and polypectomy; Multiple tooth extractions; Total knee arthroplasty (Right, 04/04/2016); LEFT HEART CATH AND CORONARY  ANGIOGRAPHY (N/A, 03/25/2022); Esophagogastroduodenoscopy (egd) with propofol  (N/A, 03/24/2022); TEE without cardioversion (03/24/2022); Hot hemostasis (N/A, 03/24/2022); Hemostasis control (03/24/2022); Bubble study (03/24/2022); IR IVC FILTER PLMT / S&I /IMG GUID/MOD SED (03/29/2022); Esophagogastroduodenoscopy (egd) with propofol  (N/A, 08/11/2022); Colonoscopy with propofol  (N/A, 08/11/2022); Hot hemostasis (N/A, 08/11/2022); polypectomy (08/11/2022); Esophagogastroduodenoscopy (egd)  with propofol  (N/A, 10/17/2022); Hot hemostasis (N/A, 10/17/2022); biopsy (10/17/2022); Vasectomy (1978); and Joint replacement (2017). Family History:  His family history includes Asthma in his brother, father, and sister; Breast cancer in his sister; Cancer in his sister; Emphysema in his father; Liver cancer in his sister.   REVIEW OF SYSTEMS  : All other systems reviewed and negative except where noted in the History of Present Illness.   PHYSICAL EXAM: There were no vitals taken for this visit. General:   Pleasant, obese male in no acute distress Head:   Normocephalic and atraumatic. Eyes:  sclerae anicteric,conjunctive pink  Heart:   regular rate and rhythm Pulm:  Clear anteriorly; no wheezing Abdomen:   Soft, Obese AB, Active bowel sounds. No tenderness, No organomegaly appreciated. Rectal: Not evaluated Extremities:  With 1-2 edema. Msk: Symmetrical without gross deformities. Peripheral pulses intact.  Walks with cane unable to get on table. Neurologic:  Alert and  oriented x4;  No focal deficits.  Skin:   Dry and intact without significant lesions or rashes. Psychiatric:  Cooperative. Normal mood and affect.       Alan JONELLE Coombs, PA-C    Attending physician's note   I have taken history, reviewed the chart and examined the patient. I performed a substantive portion of this encounter, including complete performance of at least one of the key components, in conjunction with the APP. I agree with  the Advanced Practitioner's note, impression and recommendations.    For EGD with APC vs RFA today Hb stable.   Anselm Bring, MD [facility] [phone]

## 2024-05-27 NOTE — Discharge Instructions (Signed)

## 2024-05-27 NOTE — Telephone Encounter (Signed)
 This is needing a provider signature. Once this is signed, then I can fax it back.

## 2024-05-27 NOTE — Anesthesia Preprocedure Evaluation (Signed)
 Anesthesia Evaluation  Patient identified by MRN, date of birth, ID band Patient awake    Reviewed: Allergy & Precautions, H&P , NPO status , Patient's Chart, lab work & pertinent test results  History of Anesthesia Complications (+) PONV and history of anesthetic complications  Airway Mallampati: II  TM Distance: >3 FB Neck ROM: Full    Dental no notable dental hx.    Pulmonary asthma , sleep apnea , former smoker   Pulmonary exam normal breath sounds clear to auscultation       Cardiovascular hypertension, + CAD and + Cardiac Stents  Normal cardiovascular exam Rhythm:Regular Rate:Normal  Hx of a PE in 2007  IMPRESSIONS     1. Study not well visualized to full assess endocardial borders. Left  ventricular ejection fraction, by estimation, is 55 to 60%. The left  ventricle has normal function. The left ventricle has no regional wall  motion abnormalities. There is mild left  ventricular hypertrophy. Left ventricular diastolic parameters were  normal.   2. Right ventricular systolic function is normal. The right ventricular  size is normal. There is normal pulmonary artery systolic pressure.   3. The mitral valve is normal in structure. No evidence of mitral valve  regurgitation.   4. The aortic valve was not well visualized. Aortic valve regurgitation  is not visualized.   5. The inferior vena cava is normal in size with greater than 50%  respiratory variability, suggesting right atrial pressure of 3 mmHg.     Neuro/Psych  Headaches PSYCHIATRIC DISORDERS Anxiety Depression       GI/Hepatic Neg liver ROS,GERD  ,,  Endo/Other  negative endocrine ROS    Renal/GU negative Renal ROS  negative genitourinary   Musculoskeletal  (+) Arthritis ,    Abdominal   Peds negative pediatric ROS (+)  Hematology  (+) Blood dyscrasia, anemia   Anesthesia Other Findings   Reproductive/Obstetrics negative OB ROS                               Anesthesia Physical Anesthesia Plan  ASA: 3  Anesthesia Plan: MAC   Post-op Pain Management:    Induction: Intravenous  PONV Risk Score and Plan: 1 and Propofol  infusion and Treatment may vary due to age or medical condition  Airway Management Planned: Natural Airway and Simple Face Mask  Additional Equipment:   Intra-op Plan:   Post-operative Plan:   Informed Consent: I have reviewed the patients History and Physical, chart, labs and discussed the procedure including the risks, benefits and alternatives for the proposed anesthesia with the patient or authorized representative who has indicated his/her understanding and acceptance.     Dental advisory given  Plan Discussed with: CRNA  Anesthesia Plan Comments:          Anesthesia Quick Evaluation

## 2024-05-27 NOTE — Anesthesia Postprocedure Evaluation (Signed)
 Anesthesia Post Note  Patient: Aaron [name]  Procedure(s) Performed: EGD, WITH ARGON PLASMA COAGULATION RADIOFREQUENCY ABLATION, UPPER GI TRACT, ENDOSCOPIC     Patient location during evaluation: PACU Anesthesia Type: MAC Level of consciousness: awake and alert Pain management: pain level controlled Vital Signs Assessment: post-procedure vital signs reviewed and stable Respiratory status: spontaneous breathing, nonlabored ventilation, respiratory function stable and patient connected to nasal cannula oxygen Cardiovascular status: stable and blood pressure returned to baseline Postop Assessment: no apparent nausea or vomiting Anesthetic complications: no   No notable events documented.  Last Vitals:  Vitals:   05/27/24 1210 05/27/24 1220  BP: (!) 153/70 (!) (P) 152/62  Pulse: 65 (!) 56  Resp: 17 13  Temp: 36.8 C   SpO2: 96% 96%    Last Pain:  Vitals:   05/27/24 1220  TempSrc:   PainSc: (P) 0-No pain                 Thom JONELLE Peoples

## 2024-05-28 NOTE — Telephone Encounter (Signed)
 Christina from [facility] calling to check the status of the rx request that was faxed over . Did let christina know ashlyn said once provider signs then they will be able to fax back

## 2024-05-29 ENCOUNTER — Encounter (HOSPITAL_COMMUNITY): Payer: Self-pay | Admitting: Gastroenterology

## 2024-05-29 NOTE — Op Note (Signed)
 [facility] Patient Name: Aaron [name] Procedure Date: 05/27/2024 MRN: [id number] Attending MD: Lynnie Bring , MD, [id number] Date of Birth: 1945-11-16 CSN: [id number] Age: 78 Admit Type: Inpatient Procedure:                Upper GI endoscopy Indications:              Iron  deficiency anemia secondary to chronic blood                            loss d/t GAVE s/p multiple APC's. For retreatment.                            Hb stable. Providers:                Lynnie Bring, MD, Willy Hummer, RN, Farris Southgate,                            Technician Referring MD:              Medicines:                Monitored Anesthesia Care Complications:            No immediate complications. Estimated Blood Loss:     Estimated blood loss: none. Procedure:                Pre-Anesthesia Assessment:                           - Prior to the procedure, a History and Physical                            was performed, and patient medications and                            allergies were reviewed. The patient's tolerance of                            previous anesthesia was also reviewed. The risks                            and benefits of the procedure and the sedation                            options and risks were discussed with the patient.                            All questions were answered, and informed consent                            was obtained. Prior Anticoagulants: The patient has                            taken no anticoagulant or antiplatelet agents. ASA  Grade Assessment: II - A patient with mild systemic                            disease. After reviewing the risks and benefits,                            the patient was deemed in satisfactory condition to                            undergo the procedure.                           After obtaining informed consent, the endoscope was                            passed under direct vision.  Throughout the                            procedure, the patient's blood pressure, pulse, and                            oxygen saturations were monitored continuously. The                            [id number]-H190 ([id number]) Olympus endoscope was introduced                            through the mouth, and advanced to the second part                            of duodenum. The upper GI endoscopy was                            accomplished without difficulty. The patient                            tolerated the procedure well. Scope In: Scope Out: Findings:      The examined esophagus was normal. No esophageal varices.      Mild GAVE (few angioectasias) involving the antrum. Successfully treated       using RFA TTS channel ablation catheter. Total of 31 treatments with       near complete ablation of GAVE. No evidence of portal hypertension.      The examined duodenum was normal. Impression:               - Mild GAVE s/p RFA .                           - Otherwise normal EGD.                           - No specimens collected. Moderate Sedation:      Not Applicable - Patient had care per Anesthesia. Recommendation:           - Patient has a contact number available for  emergencies. The signs and symptoms of potential                            delayed complications were discussed with the                            patient. Return to normal activities tomorrow.                            Written discharge instructions were provided to the                            patient.                           - Resume previous diet.                           - Continue present medications.                           - Hold off on any further treatment. Would only                            treat if there is significant drop in hemoglobin                            and/or patient requires blood transfusions.                           - Await pathology results.                            - The findings and recommendations were discussed                            with the patient's family. Procedure Code(s):        --- Professional ---                           [phone], Esophagogastroduodenoscopy, flexible,                            transoral; with ablation of tumor(s), polyp(s), or                            other lesion(s) (includes pre- and post-dilation                            and guide wire passage, when performed) Diagnosis Code(s):        --- Professional ---                           K31.819, Angiodysplasia of stomach and duodenum  without bleeding                           D50.0, Iron  deficiency anemia secondary to blood                            loss (chronic) CPT copyright 2022 American [employer] Association. All rights reserved. The codes documented in this report are preliminary and upon coder review may  be revised to meet current compliance requirements. Lynnie Bring, MD 05/27/2024 11:58:25 AM This report has been signed electronically. Number of Addenda: 0

## 2024-06-11 ENCOUNTER — Ambulatory Visit: Admitting: Podiatry

## 2024-06-17 ENCOUNTER — Ambulatory Visit: Admitting: Podiatry

## 2024-06-17 DIAGNOSIS — B351 Tinea unguium: Secondary | ICD-10-CM

## 2024-06-17 DIAGNOSIS — M79671 Pain in right foot: Secondary | ICD-10-CM

## 2024-06-17 DIAGNOSIS — M79672 Pain in left foot: Secondary | ICD-10-CM

## 2024-06-17 NOTE — Progress Notes (Signed)
 Patient presents for evaluation and treatment of tenderness and some redness around nails feet.  Tenderness around toes with walking and wearing shoes.  Physical exam:  General appearance: Alert, pleasant, and in no acute distress.  Vascular: Pedal pulses: DP 2/4 B/L, PT 0/4 B/L. Moderate edema lower legs bilaterally.  Capillary refill time immediate bilaterally  Neurologic:  Dermatologic:  Nails thickened, disfigured, discolored 1-5 BL with subungual debris.  Redness and hypertrophic nail folds along nail folds bilaterally but no signs of drainage or infection.  Musculoskeletal:     Diagnosis: 1. Painful onychomycotic nails 1 through 5 bilaterally. 2. Pain toes 1 through 5 bilaterally.  Plan: -Debrided onychomycotic nails 1 through 5 bilaterally.  Sharply debrided nails with nail clipper and reduced with a power bur.  Return 3 months RFC

## 2024-06-19 ENCOUNTER — Encounter: Payer: Self-pay | Admitting: Internal Medicine

## 2024-06-19 ENCOUNTER — Ambulatory Visit: Admitting: Internal Medicine

## 2024-06-19 VITALS — BP 142/68 | HR 65 | Temp 98.3°F | Resp 16 | Ht 65.5 in | Wt 240.8 lb

## 2024-06-19 DIAGNOSIS — M170 Bilateral primary osteoarthritis of knee: Secondary | ICD-10-CM | POA: Diagnosis not present

## 2024-06-19 DIAGNOSIS — D509 Iron deficiency anemia, unspecified: Secondary | ICD-10-CM | POA: Diagnosis not present

## 2024-06-19 DIAGNOSIS — E785 Hyperlipidemia, unspecified: Secondary | ICD-10-CM

## 2024-06-19 DIAGNOSIS — R7303 Prediabetes: Secondary | ICD-10-CM | POA: Diagnosis not present

## 2024-06-19 DIAGNOSIS — G8929 Other chronic pain: Secondary | ICD-10-CM

## 2024-06-19 DIAGNOSIS — E781 Pure hyperglyceridemia: Secondary | ICD-10-CM

## 2024-06-19 DIAGNOSIS — Z0000 Encounter for general adult medical examination without abnormal findings: Secondary | ICD-10-CM

## 2024-06-19 DIAGNOSIS — Z0001 Encounter for general adult medical examination with abnormal findings: Secondary | ICD-10-CM

## 2024-06-19 DIAGNOSIS — M545 Low back pain, unspecified: Secondary | ICD-10-CM

## 2024-06-19 LAB — CBC WITH DIFFERENTIAL/PLATELET
Basophils Absolute: 0.1 K/uL (ref 0.0–0.1)
Basophils Relative: 0.9 % (ref 0.0–3.0)
Eosinophils Absolute: 0.2 K/uL (ref 0.0–0.7)
Eosinophils Relative: 3.9 % (ref 0.0–5.0)
HCT: 32 % — ABNORMAL LOW (ref 39.0–52.0)
Hemoglobin: 11.3 g/dL — ABNORMAL LOW (ref 13.0–17.0)
Lymphocytes Relative: 15.9 % (ref 12.0–46.0)
Lymphs Abs: 1 K/uL (ref 0.7–4.0)
MCHC: 35.3 g/dL (ref 30.0–36.0)
MCV: 88.8 fl (ref 78.0–100.0)
Monocytes Absolute: 0.6 K/uL (ref 0.1–1.0)
Monocytes Relative: 9.9 % (ref 3.0–12.0)
Neutro Abs: 4.2 K/uL (ref 1.4–7.7)
Neutrophils Relative %: 69.4 % (ref 43.0–77.0)
Platelets: 178 K/uL (ref 150.0–400.0)
RBC: 3.61 Mil/uL — ABNORMAL LOW (ref 4.22–5.81)
RDW: 12.8 % (ref 11.5–15.5)
WBC: 6 K/uL (ref 4.0–10.5)

## 2024-06-19 LAB — LIPID PANEL
Cholesterol: 116 mg/dL (ref 28–200)
HDL: 38.2 mg/dL — ABNORMAL LOW (ref >39.00)
LDL Cholesterol: 38 mg/dL (ref 10–99)
NonHDL: 78.15
Total CHOL/HDL Ratio: 3
Triglycerides: 203 mg/dL — ABNORMAL HIGH (ref 10.0–149.0)
VLDL: 40.6 mg/dL — ABNORMAL HIGH (ref 0.0–40.0)

## 2024-06-19 LAB — TSH: TSH: 3.08 u[IU]/mL (ref 0.35–5.50)

## 2024-06-19 LAB — HEMOGLOBIN A1C: Hgb A1c MFr Bld: 5.9 % (ref 4.6–6.5)

## 2024-06-19 MED ORDER — TRAMADOL HCL 50 MG PO TABS: 100.0000 mg | ORAL_TABLET | Freq: Three times a day (TID) | ORAL | 1 refills | Status: AC

## 2024-06-19 NOTE — Progress Notes (Signed)
 Subjective:  Patient ID: Aaron [name], male    DOB: June 27, 1946  Age: 78 y.o. MRN: [id number]  CC: Hypertension (6 month follow up. Medication need refills. ), Annual Exam, and Anemia   HPI JESTEN CAPPUCCIO presents for a CPX and f/up ----   Discussed the use of AI scribe software for clinical note transcription with the patient, who gave verbal consent to proceed.  History of Present Illness Aaron [name] is a 78 year old male with anemia and gastrointestinal bleeding who presents for follow-up of his anemia and gastrointestinal symptoms.  He feels 'pretty good' and reports no weakness, dizziness, or lightheadedness. He underwent an endoscopy in November 2024 at [facility], where three areas in his stomach were patched. This was a follow-up to a previous hospitalization in 2023 when he had a more severe episode with 20 to 30 areas needing attention. The endoscopy was prompted by noticing dark, tarry stools and diarrhea-like symptoms, which have since resolved. His bowel movements have returned to normal, although it took a few days post-procedure to stabilize.  He is currently taking iron  supplements along with vitamin C, calcium , and vitamin E. He experiences occasional swelling in his legs and feet, which he attributes to not taking his fluid pills consistently to avoid frequent urination during appointments. When he takes the fluid pills, he needs to urinate every 30 minutes.  His sleep is generally poor, often waking up around 3:00 or 3:30 AM due to knee pain, which he attributes to sleeping on one side. He uses a pillow between his knees, but it often falls out during the night. When this happens, he moves to a recliner to sleep for an additional three to four hours, though this sleep is interrupted.  He mentions chronic knee and back pain, for which he has previously received hydrocodone  and injections from Dr. Cyrena at [facility]. The injections are administered with the aid of  an x-ray machine to target the center of his back, which helps alleviate pain from arthritis and a herniated disc.  Regarding his weight, he notes a discrepancy in recorded weights, mentioning a recent weight of 240.8 pounds, which he attributes partly to heavy shoes. He recalls a previous weight of 236 pounds and clarifies that he has not been over 240 pounds since his last hospitalization, except for the current measurement.     Outpatient Medications Prior to Visit  Medication Sig Dispense Refill   albuterol  (VENTOLIN  HFA) 108 (90 Base) MCG/ACT inhaler Inhale 2 puffs into the lungs.     ascorbic acid (C 500/ROSE HIPS) 500 MG tablet Take 500 mg by mouth daily.     aspirin  EC 81 MG tablet Take 1 tablet (81 mg total) by mouth daily. Swallow whole. 30 tablet 0   atorvastatin  (LIPITOR) 80 MG tablet Take 1 tablet (80 mg total) by mouth daily. 90 tablet 3   Calcium  Carb-Cholecalciferol  (CALCIUM  600 + D PO) Take 1 tablet by mouth daily.     cholecalciferol  (VITAMIN D3) 25 MCG (1000 UNIT) tablet Take 1,000 Units by mouth daily.     clonazePAM  (KLONOPIN ) 0.5 MG tablet Take 1 tablet (0.5 mg total) by mouth 2 (two) times daily as needed for anxiety. 180 tablet 0   ezetimibe  (ZETIA ) 10 MG tablet Take 1 tablet (10 mg total) by mouth daily. 100 tablet 2   ferrous sulfate  325 (65 FE) MG EC tablet TAKE 1 TABLET BY MOUTH DAILY WITH BREAKFAST. SCHEDULE AN APPOINTMENT FOR FURTHER REFILLS 90  tablet 3   furosemide  (LASIX ) 40 MG tablet Take 1 tablet (40 mg total) by mouth daily. 90 tablet 3   hydrALAZINE  (APRESOLINE ) 25 MG tablet Take 1 tablet (25 mg total) by mouth 3 (three) times daily. 270 tablet 3   methocarbamol  (ROBAXIN ) 500 MG tablet Take 500 mg by mouth daily as needed.     Multiple Vitamins-Minerals (PRESERVISION AREDS 2) CAPS Take 1 capsule by mouth daily.     neomycin-bacitracin-polymyxin (NEOSPORIN) OINT Apply 1 Application topically as needed for wound care.     pantoprazole  (PROTONIX ) 40 MG tablet  TAKE 1 TABLET BY MOUTH EVERY DAY 90 tablet 1   polyethylene glycol (MIRALAX  / GLYCOLAX ) 17 g packet Take 17 g by mouth daily. With coffee     spironolactone  (ALDACTONE ) 25 MG tablet Take 1 tablet (25 mg total) by mouth daily. 90 tablet 3   traZODone  (DESYREL ) 150 MG tablet Take 1 tablet (150 mg total) by mouth at bedtime. 90 tablet 0   venlafaxine  XR (EFFEXOR -XR) 150 MG 24 hr capsule TAKE 1 CAPSULE BY MOUTH DAILY WITH BREAKFAST. 100 capsule 0   White Petrolatum-Mineral Oil (GENTEAL TEARS NIGHT-TIME) OINT Apply 1 Application to eye at bedtime as needed (dry eyes).     traMADol  (ULTRAM ) 50 MG tablet Take 2 tablets (100 mg total) by mouth every 8 (eight) hours. 540 tablet 1   No facility-administered medications prior to visit.    ROS Review of Systems  Constitutional:  Negative for appetite change, chills, diaphoresis, fatigue and fever.  HENT: Negative.    Eyes: Negative.   Respiratory: Negative.  Negative for cough, chest tightness, shortness of breath and wheezing.   Cardiovascular:  Positive for leg swelling. Negative for chest pain and palpitations.  Gastrointestinal: Negative.  Negative for abdominal pain, constipation, diarrhea, nausea and vomiting.  Endocrine: Negative.   Genitourinary:  Negative for difficulty urinating.  Musculoskeletal:  Positive for arthralgias, back pain and gait problem.  Skin: Negative.   Neurological:  Negative for dizziness, weakness and headaches.  Hematological:  Negative for adenopathy. Does not bruise/bleed easily.  Psychiatric/Behavioral: Negative.      Objective:  BP (!) 142/68 (BP Location: Left Arm, Patient Position: Sitting, Cuff Size: Normal)   Pulse 65   Temp 98.3 F (36.8 C) (Oral)   Resp 16   Ht 5' 5.5 (1.664 m)   Wt 240 lb 12.8 oz (109.2 kg)   SpO2 97%   BMI 39.46 kg/m   BP Readings from Last 3 Encounters:  06/19/24 (!) 142/68  05/27/24 (!) (P) 152/62  05/09/24 (!) 150/55    Wt Readings from Last 3 Encounters:  06/19/24 240  lb 12.8 oz (109.2 kg)  05/27/24 235 lb (106.6 kg)  05/09/24 246 lb (111.6 kg)    Physical Exam Vitals reviewed.  Constitutional:      General: He is not in acute distress.    Appearance: He is obese. He is ill-appearing. He is not toxic-appearing or diaphoretic.  HENT:     Mouth/Throat:     Mouth: Mucous membranes are moist.  Eyes:     General: No scleral icterus.    Conjunctiva/sclera: Conjunctivae normal.  Cardiovascular:     Rate and Rhythm: Normal rate and regular rhythm.     Heart sounds: Murmur heard.     Systolic murmur is present with a grade of 1/6.     No friction rub. No gallop.  Pulmonary:     Effort: Pulmonary effort is normal.     Breath  sounds: No stridor. No wheezing, rhonchi or rales.  Abdominal:     Palpations: There is no mass.     Tenderness: There is no abdominal tenderness. There is no guarding.     Hernia: No hernia is present.  Musculoskeletal:        General: No swelling.     Cervical back: Neck supple.     Right lower leg: 2+ Edema present.     Left lower leg: 1+ Edema present.  Lymphadenopathy:     Cervical: No cervical adenopathy.  Skin:    General: Skin is warm and dry.     Coloration: Skin is pale.     Findings: No bruising.  Neurological:     General: No focal deficit present.     Mental Status: He is alert. Mental status is at baseline.  Psychiatric:        Mood and Affect: Mood normal.        Behavior: Behavior normal.     Lab Results  Component Value Date   WBC 6.0 06/19/2024   HGB 11.3 (L) 06/19/2024   HCT 32.0 (L) 06/19/2024   PLT 178.0 06/19/2024   GLUCOSE 96 04/05/2024   CHOL 116 06/19/2024   TRIG 203.0 (H) 06/19/2024   HDL 38.20 (L) 06/19/2024   LDLDIRECT 62.0 08/10/2021   LDLCALC 38 06/19/2024   ALT 18 04/05/2024   AST 18 04/05/2024   NA 139 04/05/2024   K 4.4 04/05/2024   CL 104 04/05/2024   CREATININE 1.11 04/05/2024   BUN 23 04/05/2024   CO2 28 04/05/2024   TSH 3.08 06/19/2024   PSA 0.59 02/14/2018   INR  1.11 03/25/2016   HGBA1C 5.9 06/19/2024    No results found.  Assessment & Plan:   Iron  deficiency anemia, unspecified iron  deficiency anemia type- H/H are stable. -     CBC with Differential/Platelet; Future  Dyslipidemia, goal LDL below 70- LDL goal achieved. Doing well on the statin  -     Lipid panel; Future -     TSH; Future  Primary hypertriglyceridemia -     Lipid panel; Future -     TSH; Future  Prediabetes -     Hemoglobin A1c; Future  Encounter for general adult medical examination with abnormal findings- Exam completed, labs reviewed, vaccines reviewed, cancer screenings are UTD, pt ed material was given.   Chronic low back pain, unspecified back pain laterality, unspecified whether sciatica present -     traMADol  HCl; Take 2 tablets (100 mg total) by mouth every 8 (eight) hours.  Dispense: 540 tablet; Refill: 1  Primary osteoarthritis of both knees -     traMADol  HCl; Take 2 tablets (100 mg total) by mouth every 8 (eight) hours.  Dispense: 540 tablet; Refill: 1     Follow-up: Return in about 6 months (around 12/18/2024).  Debby Molt, MD

## 2024-06-19 NOTE — Patient Instructions (Signed)
 Health Maintenance, Male  Adopting a healthy lifestyle and getting preventive care are important in promoting health and wellness. Ask your health care provider about:  The right schedule for you to have regular tests and exams.  Things you can do on your own to prevent diseases and keep yourself healthy.  What should I know about diet, weight, and exercise?  Eat a healthy diet    Eat a diet that includes plenty of vegetables, fruits, low-fat dairy products, and lean protein.  Do not eat a lot of foods that are high in solid fats, added sugars, or sodium.  Maintain a healthy weight  Body mass index (BMI) is a measurement that can be used to identify possible weight problems. It estimates body fat based on height and weight. Your health care provider can help determine your BMI and help you achieve or maintain a healthy weight.  Get regular exercise  Get regular exercise. This is one of the most important things you can do for your health. Most adults should:  Exercise for at least 150 minutes each week. The exercise should increase your heart rate and make you sweat (moderate-intensity exercise).  Do strengthening exercises at least twice a week. This is in addition to the moderate-intensity exercise.  Spend less time sitting. Even light physical activity can be beneficial.  Watch cholesterol and blood lipids  Have your blood tested for lipids and cholesterol at 78 years of age, then have this test every 5 years.  You may need to have your cholesterol levels checked more often if:  Your lipid or cholesterol levels are high.  You are older than 78 years of age.  You are at high risk for heart disease.  What should I know about cancer screening?  Many types of cancers can be detected early and may often be prevented. Depending on your health history and family history, you may need to have cancer screening at various ages. This may include screening for:  Colorectal cancer.  Prostate cancer.  Skin cancer.  Lung  cancer.  What should I know about heart disease, diabetes, and high blood pressure?  Blood pressure and heart disease  High blood pressure causes heart disease and increases the risk of stroke. This is more likely to develop in people who have high blood pressure readings or are overweight.  Talk with your health care provider about your target blood pressure readings.  Have your blood pressure checked:  Every 3-5 years if you are 24-52 years of age.  Every year if you are 3 years old or older.  If you are between the ages of 60 and 72 and are a current or former smoker, ask your health care provider if you should have a one-time screening for abdominal aortic aneurysm (AAA).  Diabetes  Have regular diabetes screenings. This checks your fasting blood sugar level. Have the screening done:  Once every three years after age 66 if you are at a normal weight and have a low risk for diabetes.  More often and at a younger age if you are overweight or have a high risk for diabetes.  What should I know about preventing infection?  Hepatitis B  If you have a higher risk for hepatitis B, you should be screened for this virus. Talk with your health care provider to find out if you are at risk for hepatitis B infection.  Hepatitis C  Blood testing is recommended for:  Everyone born from [birth year] through 1965.  Anyone  with known risk factors for hepatitis C.  Sexually transmitted infections (STIs)  You should be screened each year for STIs, including gonorrhea and chlamydia, if:  You are sexually active and are younger than 78 years of age.  You are older than 78 years of age and your health care provider tells you that you are at risk for this type of infection.  Your sexual activity has changed since you were last screened, and you are at increased risk for chlamydia or gonorrhea. Ask your health care provider if you are at risk.  Ask your health care provider about whether you are at high risk for HIV. Your health care provider  may recommend a prescription medicine to help prevent HIV infection. If you choose to take medicine to prevent HIV, you should first get tested for HIV. You should then be tested every 3 months for as long as you are taking the medicine.  Follow these instructions at home:  Alcohol use  Do not drink alcohol if your health care provider tells you not to drink.  If you drink alcohol:  Limit how much you have to 0-2 drinks a day.  Know how much alcohol is in your drink. In the [place] one drink equals one 12 oz bottle of beer (355 mL), one 5 oz glass of wine (148 mL), or one 1 oz glass of hard liquor (44 mL).  Lifestyle  Do not use any products that contain nicotine or tobacco. These products include cigarettes, chewing tobacco, and vaping devices, such as e-cigarettes. If you need help quitting, ask your health care provider.  Do not use street drugs.  Do not share needles.  Ask your health care provider for help if you need support or information about quitting drugs.  General instructions  Schedule regular health, dental, and eye exams.  Stay current with your vaccines.  Tell your health care provider if:  You often feel depressed.  You have ever been abused or do not feel safe at home.  Summary  Adopting a healthy lifestyle and getting preventive care are important in promoting health and wellness.  Follow your health care provider's instructions about healthy diet, exercising, and getting tested or screened for diseases.  Follow your health care provider's instructions on monitoring your cholesterol and blood pressure.  This information is not intended to replace advice given to you by your health care provider. Make sure you discuss any questions you have with your health care provider.  Document Revised: 11/09/2020 Document Reviewed: 11/09/2020  Elsevier Patient Education  2024 [employer].

## 2024-06-20 ENCOUNTER — Ambulatory Visit: Payer: Self-pay | Admitting: Internal Medicine

## 2024-06-28 ENCOUNTER — Ambulatory Visit: Admitting: Emergency Medicine

## 2024-07-05 ENCOUNTER — Telehealth: Payer: Self-pay

## 2024-07-05 NOTE — Progress Notes (Signed)
 " Cardiology Office Note:    Date:  07/05/2024   ID:  Aaron [name], DOB 1946-04-15, MRN [id number]  PCP:  Aaron Debby LITTIE, MD   [facility] Providers Cardiologist:  Malon Branton, MD Cardiology APP:  Madie Jon Garre, [place]     Referring MD: Aaron Debby LITTIE, MD   No chief complaint on file.   History of Present Illness:    Aaron [name] is a 79 y.o. male seen for follow up edema. 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  He has a history of stenting to his mid LAD in 2005 following a nuclear stress test in that showed evidence of anterior apical ischemia.  Follow-up Myoview  in 2014 was normal.  He was anticoagulated on Xarelto  for recurrent PE/DVT, but with a history of chronic IDA requiring transfusions.  Hypertension was managed by his PCP. He was recently hospitalized 03/2022 with an in-hospital Vfib arrest in the setting of GI bleed and Hb of 4.8. Initial EKG with ST depressions. LHC revealed nonobstructive CAD, no intervention. Telemetry with sinus rhythm. Suspected demand  ischemia in the setting of severe anemia. However, due to risk of re-bleed, he was discharged with a lifevest.  GI workup notable for EGD showing gastric antral vascular ectasia treated with APC, felt to be extremely high risk for re-bleed. GI recommended complete discontinuation of OAC. Therefore, Xarelto  held and IVC filter was placed by IR. His last EGD on April 14 showed multiple gastric polyps and GAVE treated again with APC.   He had repeat EGD in November showing mild GAVE treated with ablation.   On follow up today he is doing well. Notes a little lightheadedness with standing. Thinks his LE edema is stable. Weight is down. No chest pain or dyspnea. Sold his house and now living in apartment.      Past Medical History:  Diagnosis Date   Allergy    Anemia    @ birth   Anxiety    Asthma    Blood transfusion    @  birth   Blood transfusion without reported diagnosis 08-Nov-1945   Cancer [facility])    Head - skin cancer   Cataract    L eye   Clotting disorder Sept. 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50   Psoriasis    PTSD (post-traumatic stress disorder)    Pulmonary embolism (HCC) ~ 03/2006   bilaterally   Seasonal allergies    Shortness of breath 06/08/2011   w/exertion; that's why I'm here   Sleep apnea    Sleep apnea, obstructive    Umbilical hernia     Past Surgical History:  Procedure Laterality Date   BIOPSY  10/17/2022   Procedure: BIOPSY;  Surgeon: Eda Iha, MD;  Location: [facility] ENDOSCOPY;  Service: Gastroenterology;;   BUBBLE STUDY  03/24/2022   Procedure: BUBBLE STUDY;  Surgeon: Eda Iha, MD;  Location: [facility] ENDOSCOPY;  Service: Gastroenterology;;   CARDIAC CATHETERIZATION     carpel tunnel release Bilateral ~ 06/2005/~ 08/2005   right/left   COLONOSCOPY W/ BIOPSIES AND POLYPECTOMY     COLONOSCOPY WITH PROPOFOL  N/A 08/11/2022   Procedure: COLONOSCOPY WITH PROPOFOL ;  Surgeon: Eda Iha, MD;  Location: [facility] ENDOSCOPY;  Service: Gastroenterology;  Laterality: N/A;   CORONARY ANGIOPLASTY     CORONARY STENT PLACEMENT  02/13/2004   1   ENUCLEATION Right    age 41   ESOPHAGOGASTRODUODENOSCOPY (EGD) WITH PROPOFOL  N/A 03/24/2022   Procedure: ESOPHAGOGASTRODUODENOSCOPY (EGD) WITH PROPOFOL ;  Surgeon: Eda Iha, MD;  Location: [facility] ENDOSCOPY;  Service: Gastroenterology;  Laterality: N/A;   ESOPHAGOGASTRODUODENOSCOPY (EGD) WITH PROPOFOL  N/A 08/11/2022    Procedure: ESOPHAGOGASTRODUODENOSCOPY (EGD) WITH PROPOFOL ;  Surgeon: Eda Iha, MD;  Location: [facility] ENDOSCOPY;  Service: Gastroenterology;  Laterality: N/A;   ESOPHAGOGASTRODUODENOSCOPY (EGD) WITH PROPOFOL  N/A 10/17/2022   Procedure: ESOPHAGOGASTRODUODENOSCOPY (EGD) WITH PROPOFOL ;  Surgeon: Eda Iha, MD;  Location: [facility] ENDOSCOPY;  Service: Gastroenterology;  Laterality: N/A;   EYE SURGERY     age 57; ruptured  right pupil   EYE SURGERY     age 57   EYE SURGERY  1957   placed artificial right eye   GI RADIOFREQUENCY ABLATION  05/27/2024   Procedure: RADIOFREQUENCY ABLATION, UPPER GI TRACT, ENDOSCOPIC;  Surgeon: Charlanne Groom, MD;  Location: [facility];  Service: Gastroenterology;;   HEMOSTASIS CONTROL  03/24/2022   Procedure: HEMOSTASIS CONTROL;  Surgeon: Eda Iha, MD;  Location: [facility] ENDOSCOPY;  Service: Gastroenterology;;   HOT HEMOSTASIS N/A 03/24/2022   Procedure: HOT HEMOSTASIS (ARGON PLASMA COAGULATION/BICAP);  Surgeon: Eda Iha, MD;  Location: [facility] ENDOSCOPY;  Service: Gastroenterology;  Laterality: N/A;   HOT HEMOSTASIS N/A 08/11/2022   Procedure: HOT HEMOSTASIS (ARGON PLASMA COAGULATION/BICAP);  Surgeon: Eda Iha, MD;  Location: [facility];  Service: Gastroenterology;  Laterality: N/A;  stomach   HOT HEMOSTASIS N/A 10/17/2022   Procedure: HOT HEMOSTASIS (ARGON PLASMA COAGULATION/BICAP);  Surgeon: Eda Iha, MD;  Location: [facility];  Service: Gastroenterology;  Laterality: N/A;   HOT HEMOSTASIS N/A 05/27/2024   Procedure: EGD, WITH ARGON PLASMA COAGULATION;  Surgeon: Charlanne Groom, MD;  Location: [facility] ENDOSCOPY;  Service: Gastroenterology;  Laterality: N/A;   INGUINAL HERNIA REPAIR Right 1983   INGUINAL HERNIA REPAIR Left 1985   IR IVC FILTER PLMT / S&I /IMG GUID/MOD SED  03/29/2022   JOINT REPLACEMENT  2017   right & left  knee   KNEE ARTHROSCOPY  ~ 06/2005   right   LEFT HEART CATH AND CORONARY ANGIOGRAPHY N/A 03/25/2022    Procedure: LEFT HEART CATH AND CORONARY ANGIOGRAPHY;  Surgeon: Wendel Lurena POUR, MD;  Location: [facility] INVASIVE CV LAB;  Service: Cardiovascular;  Laterality: N/A;   MULTIPLE TOOTH EXTRACTIONS     POLYPECTOMY  08/11/2022   Procedure: POLYPECTOMY;  Surgeon: Eda Iha, MD;  Location: [facility] ENDOSCOPY;  Service: Gastroenterology;;  colon   spinal injections  2006-2007   maybe 10 injections for herniated discs   TEE WITHOUT CARDIOVERSION  03/24/2022   Procedure: TRANSESOPHAGEAL ECHOCARDIOGRAM (TEE);  Surgeon: Eda Iha, MD;  Location: [facility] ENDOSCOPY;  Service: Gastroenterology;;   TONSILLECTOMY     when I was real young   TOTAL KNEE ARTHROPLASTY Left 10/09/2015   Procedure: TOTAL KNEE ARTHROPLASTY;  Surgeon: Norleen Gavel, MD;  Location: [facility] OR;  Service: Orthopedics;  Laterality: Left;   TOTAL KNEE ARTHROPLASTY Right 04/04/2016   Procedure: TOTAL KNEE ARTHROPLASTY;  Surgeon: Norleen  Yvone, MD;  Location: [facility] OR;  Service: Orthopedics;  Laterality: Right;   VASECTOMY  1978    Current Medications: No outpatient medications have been marked as taking for the 07/09/24 encounter (Appointment) with Oceane Fosse M, MD.     Allergies:   Wellbutrin [bupropion] and Amoxicillin   Social History   Socioeconomic History   Marital status: Married    Spouse name: Not on file   Number of children: 3   Years of education: Not on file   Highest education level: GED or equivalent  Occupational History   Occupation: building maintenance    Comment: disabled  Tobacco Use   Smoking status: Former    Current packs/day: 0.00    Average packs/day: 1.5 packs/day for 25.0 years (37.5 ttl pk-yrs)    Types: Cigarettes    Start date: 07/09/1970    Quit date: 07/10/1995    Years since quitting: 29.0   Smokeless tobacco: Never  Vaping Use   Vaping status: Never Used  Substance and Sexual Activity   Alcohol use: No   Drug use: No   Sexual activity: Yes    Partners: Female    Comment: married  Other  Topics Concern   Not on file  Social History Narrative   Former education administrator, 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   Recent stress, currently undergoing financial difficulties.   Social Drivers of Health   Tobacco Use: Medium Risk (06/19/2024)   Patient History    Smoking Tobacco Use: Former    Smokeless Tobacco Use: Never    Passive Exposure: Not on file  Financial Resource Strain: Medium Risk (06/18/2024)   Overall Financial Resource Strain (CARDIA)    Difficulty of Paying Living Expenses: Somewhat hard  Food Insecurity: No Food Insecurity (06/18/2024)   Epic    Worried About Programme Researcher, Broadcasting/film/video in the Last Year: Never true    Ran Out of Food in the Last Year: Never true  Transportation Needs: No Transportation Needs (06/18/2024)   Epic    Lack of Transportation (Medical): No    Lack of Transportation (Non-Medical): No  Physical Activity: Inactive (06/18/2024)   Exercise Vital Sign    Days of Exercise per Week: 0 days    Minutes of Exercise per Session: Not on file  Stress: Stress Concern Present (06/18/2024)   [employer] of Occupational Health - Occupational Stress Questionnaire    Feeling of Stress: To some extent  Social Connections: Socially Isolated (06/18/2024)   Social Connection and Isolation Panel    Frequency of Communication with Friends and Family: Once a week    Frequency of Social Gatherings with Friends and Family: Never    Attends Religious Services: Never    Database Administrator or Organizations: No    Attends Engineer, Structural: Not on file    Marital Status: Married  Depression (PHQ2-9): Low Risk (06/19/2024)   Depression (PHQ2-9)    PHQ-2 Score: 1  Alcohol Screen: Low Risk (12/06/2023)   Alcohol Screen    Last Alcohol Screening Score (AUDIT): 0  Housing: Low Risk (06/18/2024)   Epic    Unable to Pay for Housing in the Last Year: No    Number of Times Moved in the Last Year: 1    Homeless in the Last Year: No  Utilities: Not  At Risk (12/06/2023)   AHC Utilities    Threatened with loss of utilities: No  Health Literacy: Adequate Health Literacy (12/06/2023)   B1300 Health  Literacy    Frequency of need for help with medical instructions: Never     Family History: The patient's family history includes Asthma in his brother, father, and sister; Breast cancer in his sister; Cancer in his sister; Emphysema in his father; Liver cancer in his sister. There is no history of Colon cancer, Esophageal cancer, Colon polyps, Rectal cancer, or Stomach cancer.  ROS:   Please see the history of present illness.     All other systems reviewed and are negative.  EKGs/Labs/Other Studies Reviewed:    The following studies were reviewed today:  Left heart cath 03/25/22:   Dist LAD lesion is 10% stenosed.   1st Mrg lesion is 30% stenosed.   1.  Diffuse mild to moderate obstructive disease with patent mid LAD stent. 2.  Elevated LVEDP of 25 mmHg.   Commendation: Continued medical therapy.     Echo 03/26/22:  1. Study not well visualized to full assess endocardial borders. Left  ventricular ejection fraction, by estimation, is 55 to 60%. The left  ventricle has normal function. The left ventricle has no regional wall  motion abnormalities. There is mild left  ventricular hypertrophy. Left ventricular diastolic parameters were  normal.   2. Right ventricular systolic function is normal. The right ventricular  size is normal. There is normal pulmonary artery systolic pressure.   3. The mitral valve is normal in structure. No evidence of mitral valve  regurgitation.   4. The aortic valve was not well visualized. Aortic valve regurgitation  is not visualized.   5. The inferior vena cava is normal in size with greater than 50%  respiratory variability, suggesting right atrial pressure of 3 mmHg.      TEE 03/24/22: See external report  EKG:  EKG is not ordered today.    Recent Labs: 04/05/2024: ALT 18; BUN 23; Creatinine,  Ser 1.11; Potassium 4.4; Sodium 139 06/19/2024: Hemoglobin 11.3; Platelets 178.0; TSH 3.08  Recent Lipid Panel    Component Value Date/Time   CHOL 116 06/19/2024 1512   CHOL 151 12/17/2020 1017   TRIG 203.0 (H) 06/19/2024 1512   HDL 38.20 (L) 06/19/2024 1512   HDL 40 12/17/2020 1017   CHOLHDL 3 06/19/2024 1512   VLDL 40.6 (H) 06/19/2024 1512   LDLCALC 38 06/19/2024 1512   LDLCALC 89 12/17/2020 1017   LDLDIRECT 62.0 08/10/2021 1520     Risk Assessment/Calculations:               Physical Exam:    VS:  There were no vitals taken for this visit.    Wt Readings from Last 3 Encounters:  06/19/24 240 lb 12.8 oz (109.2 kg)  05/27/24 235 lb (106.6 kg)  05/09/24 246 lb (111.6 kg)     GEN:  Well nourished, well developed in no acute distress HEENT: Normal NECK: No JVD; No carotid bruits LYMPHATICS: No lymphadenopathy CARDIAC: RRR, no murmurs, rubs, gallops RESPIRATORY:  Clear to auscultation without rales, wheezing or rhonchi  ABDOMEN: Soft, non-tender, non-distended MUSCULOSKELETAL: 1+ edema at ankles; No deformity  SKIN: Warm and dry NEUROLOGIC:  Alert and oriented x 3 PSYCHIATRIC:  Normal affect   ASSESSMENT:    No diagnosis found.     PLAN:    In order of problems listed above:  Lower extremity edema. Suspect a component of venous insufficiency with prior bilateral TKR and prior DVT. Could have some diastolic CHF as well. Recommend continuing current lasix  and aldactone  dose. Discussed importance of low sodium diet, foot  elevation at rest and wearing compression hose.    2. NSTEMI - demand ischemia- also in setting of profound anemia CAD s/p old LAD PCI. Cardiac cath showed patent stent with mild nonobstructive CAD Medical management.    3. Chronic diastolic heart failure- no evidence of volume overload.  Echo with preserved EF and normal RV On lasix  and aldactone . If swelling worsens would consider resuming Jardiance .    4. HTN controlled. D  5. HLD  excellent control with LDL 38   Follow up in 6 months.       Medication Adjustments/Labs and Tests Ordered: Current medicines are reviewed at length with the patient today.  Concerns regarding medicines are outlined above.  No orders of the defined types were placed in this encounter.  No orders of the defined types were placed in this encounter.   There are no Patient Instructions on file for this visit.   Signed, Alejandrina Raimer, MD  07/05/2024 1:03 PM    [facility] "

## 2024-07-05 NOTE — Telephone Encounter (Signed)
 Copied from CRM [id number]. Topic: General - Call Back - No Documentation >> Jul 05, 2024 11:54 AM Olam RAMAN wrote: Reason for CRM: [id number] Philippe from [facility] calling to speak to Prov[employer] about cpap was denied, and needs a rx demographic for cpap machine fax: [phone] phone: [phone]

## 2024-07-05 NOTE — Telephone Encounter (Signed)
 Unable to get anybody on the phone.

## 2024-07-09 ENCOUNTER — Encounter: Payer: Self-pay | Admitting: Cardiology

## 2024-07-09 ENCOUNTER — Ambulatory Visit: Attending: Cardiology | Admitting: Cardiology

## 2024-07-09 VITALS — BP 132/58 | HR 80 | Ht 66.0 in | Wt 244.0 lb

## 2024-07-09 DIAGNOSIS — I2510 Atherosclerotic heart disease of native coronary artery without angina pectoris: Secondary | ICD-10-CM | POA: Diagnosis not present

## 2024-07-09 DIAGNOSIS — I10 Essential (primary) hypertension: Secondary | ICD-10-CM | POA: Diagnosis not present

## 2024-07-09 DIAGNOSIS — Z9861 Coronary angioplasty status: Secondary | ICD-10-CM | POA: Diagnosis not present

## 2024-07-09 DIAGNOSIS — I5032 Chronic diastolic (congestive) heart failure: Secondary | ICD-10-CM

## 2024-07-09 DIAGNOSIS — E7800 Pure hypercholesterolemia, unspecified: Secondary | ICD-10-CM

## 2024-07-09 NOTE — Patient Instructions (Addendum)
 Medication Instructions:  Continue same medications *If you need a refill on your cardiac medications before your next appointment, please call your pharmacy*  Lab Work: None ordered  Testing/Procedures: None ordered  Follow-Up: At [facility], you and your health needs are our priority.  As part of our continuing mission to provide you with exceptional heart care, our providers are all part of one team.  This team includes your primary Cardiologist (physician) and Advanced Practice Providers or APPs (Physician Assistants and Nurse Practitioners) who all work together to provide you with the care you need, when you need it.  Your next appointment:  6 months   Call in April to schedule July appointment     Provider:  Dr.Jordan   We recommend signing up for the patient portal called MyChart.  Sign up information is provided on this After Visit Summary.  MyChart is used to connect with patients for Virtual Visits (Telemedicine).  Patients are able to view lab/test results, encounter notes, upcoming appointments, etc.  Non-urgent messages can be sent to your provider as well.   To learn more about what you can do with MyChart, go to [email or web address].

## 2024-07-10 NOTE — Telephone Encounter (Signed)
 This has been handled by Dr. Hope. The no longer need anything from us .

## 2024-07-14 ENCOUNTER — Other Ambulatory Visit: Payer: Self-pay | Admitting: Internal Medicine

## 2024-07-14 DIAGNOSIS — G4700 Insomnia, unspecified: Secondary | ICD-10-CM

## 2024-07-14 DIAGNOSIS — F411 Generalized anxiety disorder: Secondary | ICD-10-CM

## 2024-07-14 NOTE — Progress Notes (Unsigned)
 "  [facility] Return Visit   Referring Provider Joshua Debby CROME, MD [address] [place],  [place] [address]  Primary Care Provider Joshua Debby CROME, MD  Patient Profile: Aaron [name] is a 79 y.o. male who returns to the [facility] for follow-up of the problem(s) noted below.  Problem List: Iron  deficiency anemia with history of GAVE GERD   History of Present Illness    Discussed the use of AI scribe software for clinical note transcription with the patient, who gave verbal consent to proceed.  History of Present Illness Aaron [name] is a 79 year old gentleman with a past medical history noteworthy for  anxiety, hypertension, hyperlipidemia, coronary artery disease s/p DES to the LAD 2005, V-fib arrest 03/2022 during hospitalization for acute GI bleeding Hgb 4.8, PE/DVTs status post IVC, IDA, obesity, obstructive sleep apnea, pre-diabetes, psoriasis who returns to the gastroenterology office for follow-up of the gastrointestinal issues outlined below  Current GI Meds  Pantoprazole  40 mg orally daily  Interval History   Iron  deficiency anemia with history of GAVE Prior GI history as documented by Alan Coombs, PA:  03/25/2022 cardiac catheterization mild to moderate obstructive disease within mid LAD stent EGD 03/24/2022 showed GAVE s/p APC and Hemospray Patient did have history of DVT off Xarelto  status post IVC filter with IR on 03/29/2022.  08/11/2022 EGD and colonoscopy for IDA with Dr. Eda  -- Colonoscopy with good bowel prep showed 2 polyps 8 to 10 mm transverse colon, 6 mm polyp ascending colon, 2 mm cecum no repeat colonoscopy secondary to age  -- EGD normal esophagus, GAVE status post APC, normal duodenum 10/17/2022 EGD in the hospital with Dr. Eda for GAVE status post APC 03/27/2024 EGD in hospital with Dr. Charlanne for GAVE status post RFA -total of 21 treatments with near complete ablation of GAVE -Dr. Charlanne advised no further EGD  unless significant drop in hemoglobin or transfusion dependent  Labs 06/19/2024: Hgb 11.3, HCT 32 -overall stable x 6 months  GERD Currently on pantoprazole  40 mg orally daily  History of adenomatous colon polyps 08/11/2022 colonoscopy with good bowel prep showed 2 polyps 8 to 10 mm transverse colon, 6 mm polyp ascending colon, 2 mm cecum -these were tubular adenomas No repeat colonoscopy recommended based upon age  GI Review of Symptoms Significant for {GIROS:50592}. Otherwise negative.  General Review of Systems  Review of systems is significant for the pertinent positives and negatives as listed per the HPI.  Full ROS is otherwise negative.  Past Medical History   Past Medical History:  Diagnosis Date   Allergy    Anemia    @ birth   Anxiety    Asthma    Blood transfusion    @ birth   Blood transfusion without reported diagnosis 1946-04-17   Cancer [facility])    Head - skin cancer   Cataract    L eye   Clotting disorder Sept. 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28   Psoriasis    PTSD (post-traumatic stress disorder)    Pulmonary embolism (HCC) ~ 03/2006   bilaterally   Seasonal allergies    Shortness of breath 06/08/2011   w/exertion; that's why I'm here   Sleep apnea    Sleep apnea, obstructive    Umbilical hernia      Past Surgical History   Past Surgical History:  Procedure Laterality Date   BIOPSY  10/17/2022   Procedure: BIOPSY;  Surgeon: Eda Iha, MD;  Location: [facility] ENDOSCOPY;  Service: Gastroenterology;;   VASSIE STUDY  03/24/2022    Procedure: BUBBLE STUDY;  Surgeon: Eda Iha, MD;  Location: [name] ENDOSCOPY;  Service: Gastroenterology;;   CARDIAC CATHETERIZATION     carpel tunnel release Bilateral ~ 06/2005/~ 08/2005   right/left   COLONOSCOPY W/ BIOPSIES AND POLYPECTOMY     COLONOSCOPY WITH PROPOFOL  N/A 08/11/2022   Procedure: COLONOSCOPY WITH PROPOFOL ;  Surgeon: Eda Iha, MD;  Location: [facility] ENDOSCOPY;  Service: Gastroenterology;  Laterality: N/A;   CORONARY ANGIOPLASTY     CORONARY STENT PLACEMENT  02/13/2004   1   ENUCLEATION Right    age 80   ESOPHAGOGASTRODUODENOSCOPY (EGD) WITH PROPOFOL  N/A 03/24/2022   Procedure: ESOPHAGOGASTRODUODENOSCOPY (EGD) WITH PROPOFOL ;  Surgeon: Eda Iha, MD;  Location: [facility] ENDOSCOPY;  Service: Gastroenterology;  Laterality: N/A;   ESOPHAGOGASTRODUODENOSCOPY (EGD) WITH PROPOFOL  N/A 08/11/2022   Procedure: ESOPHAGOGASTRODUODENOSCOPY (EGD) WITH PROPOFOL ;  Surgeon: Eda Iha, MD;  Location: [facility] ENDOSCOPY;  Service: Gastroenterology;  Laterality: N/A;   ESOPHAGOGASTRODUODENOSCOPY (EGD) WITH PROPOFOL  N/A 10/17/2022   Procedure: ESOPHAGOGASTRODUODENOSCOPY (EGD) WITH PROPOFOL ;  Surgeon: Eda Iha, MD;  Location: [facility] ENDOSCOPY;  Service: Gastroenterology;  Laterality: N/A;   EYE SURGERY     age 70; ruptured  right pupil   EYE SURGERY     age 51   EYE SURGERY  1957   placed artificial right eye   GI RADIOFREQUENCY ABLATION  05/27/2024   Procedure: RADIOFREQUENCY ABLATION, UPPER GI TRACT, ENDOSCOPIC;  Surgeon: Charlanne Groom, MD;  Location: [facility];  Service: Gastroenterology;;   HEMOSTASIS CONTROL  03/24/2022   Procedure: HEMOSTASIS CONTROL;  Surgeon: Eda Iha, MD;  Location: [facility] ENDOSCOPY;  Service: Gastroenterology;;   HOT HEMOSTASIS N/A 03/24/2022   Procedure: HOT HEMOSTASIS (ARGON PLASMA COAGULATION/BICAP);  Surgeon: Eda Iha, MD;  Location: [facility] ENDOSCOPY;  Service: Gastroenterology;  Laterality: N/A;   HOT HEMOSTASIS N/A 08/11/2022    Procedure: HOT HEMOSTASIS (ARGON PLASMA COAGULATION/BICAP);  Surgeon: Eda Iha, MD;  Location: [facility];  Service: Gastroenterology;  Laterality: N/A;  stomach   HOT HEMOSTASIS N/A 10/17/2022   Procedure: HOT HEMOSTASIS (ARGON PLASMA COAGULATION/BICAP);  Surgeon: Eda Iha, MD;  Location: [facility];  Service: Gastroenterology;  Laterality: N/A;   HOT HEMOSTASIS N/A 05/27/2024   Procedure: EGD, WITH ARGON PLASMA COAGULATION;  Surgeon: Charlanne Groom, MD;  Location: [facility] ENDOSCOPY;  Service: Gastroenterology;  Laterality: N/A;   INGUINAL HERNIA REPAIR Right 1983   INGUINAL HERNIA REPAIR Left 1985   IR IVC FILTER PLMT / S&I /IMG GUID/MOD SED  03/29/2022   JOINT REPLACEMENT  2017   right & left  knee   KNEE ARTHROSCOPY  ~ 06/2005   right   LEFT HEART CATH AND CORONARY ANGIOGRAPHY N/A 03/25/2022   Procedure: LEFT HEART CATH AND CORONARY ANGIOGRAPHY;  Surgeon: Wendel Lurena POUR, MD;  Location: [facility] INVASIVE CV LAB;  Service: Cardiovascular;  Laterality: N/A;   MULTIPLE TOOTH EXTRACTIONS     POLYPECTOMY  08/11/2022  Procedure: POLYPECTOMY;  Surgeon: Eda Iha, MD;  Location: [facility] ENDOSCOPY;  Service: Gastroenterology;;  colon   spinal injections  2006-2007   maybe 10 injections for herniated discs   TEE WITHOUT CARDIOVERSION  03/24/2022   Procedure: TRANSESOPHAGEAL ECHOCARDIOGRAM (TEE);  Surgeon: Eda Iha, MD;  Location: [facility] ENDOSCOPY;  Service: Gastroenterology;;   TONSILLECTOMY     when I was real young   TOTAL KNEE ARTHROPLASTY Left 10/09/2015   Procedure: TOTAL KNEE ARTHROPLASTY;  Surgeon: Norleen Gavel, MD;  Location: [facility] OR;  Service: Orthopedics;  Laterality: Left;   TOTAL KNEE ARTHROPLASTY Right 04/04/2016   Procedure: TOTAL KNEE ARTHROPLASTY;  Surgeon: Norleen Gavel, MD;  Location: [facility] OR;  Service: Orthopedics;  Laterality: Right;   VASECTOMY  1978     Allergies and Medications   Allergies[1] @MEDSTODAY @  Family His   Family History  Problem  Relation Age of Onset   Asthma Father    Emphysema Father    Breast cancer Sister    Liver cancer Sister    Asthma Sister    Cancer Sister    Asthma Brother    Colon cancer Neg Hx    Esophageal cancer Neg Hx    Colon polyps Neg Hx    Rectal cancer Neg Hx    Stomach cancer Neg Hx    GI Specific Family History: {gifamhx:50061}   Social History   Social History[2] Cormick reports that he quit smoking about 29 years ago. His smoking use included cigarettes. He started smoking about 54 years ago. He has a 37.5 pack-year smoking history. He has never used smokeless tobacco. He reports that he does not drink alcohol and does not use drugs.  Vital Signs and Physical Examination   There were no vitals filed for this visit. There is no height or weight on file to calculate BMI.    General: Well developed, well nourished, no acute distress Head: Normocephalic and atraumatic Eyes: Sclerae anicteric, EOMI Ears: Normal auditory acuity Mouth: No deformities or lesions noted Lungs: Clear throughout to auscultation Heart: Regular rate and rhythm; No murmurs, rubs or bruits Abdomen: Soft, non tender and non distended. No masses, hepatosplenomegaly or hernias noted. Normal Bowel sounds Rectal: Musculoskeletal: Symmetrical with no gross deformities  Pulses:  Normal pulses noted Extremities: No edema or deformities noted Neurological: Alert oriented x 4, grossly nonfocal Psychological:  Alert and cooperative. Normal mood and affect   Review of Data   The following data was reviewed at the time of this encounter:   Laboratory Studies      Latest Ref Rng & Units 06/19/2024    3:12 PM 05/09/2024    2:48 PM 04/12/2024   10:05 AM  CBC  WBC 4.0 - 10.5 K/uL 6.0  6.8  7.2   Hemoglobin 13.0 - 17.0 g/dL 88.6  88.1  88.4   Hematocrit 39.0 - 52.0 % 32.0  33.8  34.0   Platelets 150.0 - 400.0 K/uL 178.0  185  218.0     No results found for: LIPASE    Latest Ref Rng & Units 04/05/2024     3:25 PM 12/19/2023    3:09 PM 05/16/2023    2:52 PM  CMP  Glucose 70 - 99 mg/dL 96  89  870   BUN 6 - 23 mg/dL 23  27  22    Creatinine 0.40 - 1.50 mg/dL 8.88  8.85  8.85   Sodium 135 - 145 mEq/L 139  139  140   Potassium 3.5 - 5.1 mEq/L  4.4  4.3  4.3   Chloride 96 - 112 mEq/L 104  102  105   CO2 19 - 32 mEq/L 28  31  26    Calcium  8.4 - 10.5 mg/dL 9.3  9.2  9.1   Total Protein 6.0 - 8.3 g/dL 6.8  6.9    Total Bilirubin 0.2 - 1.2 mg/dL 0.4  0.6    Alkaline Phos 39 - 117 U/L 68  85    AST 0 - 37 U/L 18  15    ALT 0 - 53 U/L 18  25       Imaging Studies    GI Procedures and Studies   EGD 03/24/2022 showed GAVE s/p APC and Hemospray  08/11/2022 EGD and colonoscopy for IDA with Dr. Eda  -- Colonoscopy with good bowel prep showed 2 polyps 8 to 10 mm transverse colon, 6 mm polyp ascending colon, 2 mm cecum no repeat colonoscopy secondary to age  -- EGD normal esophagus, GAVE status post APC, normal duodenum  10/17/2022 EGD in the hospital with Dr. Eda for GAVE status post APC  03/27/2024 EGD in hospital with Dr. Charlanne for GAVE status post RFA -total of 21 treatments with near complete ablation of GAVE -Dr. Charlanne advised no further EGD unless significant drop in hemoglobin or transfusion dependent   Clinical Impression  It is my clinical impression that Aaron [name] is a 79 y.o. male with;  ***  Plan  *** *** *** *** ***   Planned Follow Up No follow-ups on file.  The patient or caregiver verbalized understanding of the material covered, with no barriers to understanding. All questions were answered. Patient or caregiver is agreeable with the plan outlined above.    It was a pleasure to see Aaron [name].  If you have any questions or concerns regarding this evaluation, do not hesitate to contact me.  Inocente Hausen, MD [facility]      [1]  Allergies Allergen Reactions   Wellbutrin [Bupropion] Other (See Comments)    Hallucinations, sweating    Amoxicillin Palpitations  [2]  Social History Tobacco Use   Smoking status: Former    Current packs/day: 0.00    Average packs/day: 1.5 packs/day for 25.0 years (37.5 ttl pk-yrs)    Types: Cigarettes    Start date: 07/09/1970    Quit date: 07/10/1995    Years since quitting: 29.0   Smokeless tobacco: Never  Vaping Use   Vaping status: Never Used  Substance Use Topics   Alcohol use: No   Drug use: No   "

## 2024-07-16 ENCOUNTER — Other Ambulatory Visit: Payer: Self-pay | Admitting: Internal Medicine

## 2024-07-16 ENCOUNTER — Ambulatory Visit: Admitting: Pediatrics

## 2024-07-16 DIAGNOSIS — F411 Generalized anxiety disorder: Secondary | ICD-10-CM

## 2024-07-16 DIAGNOSIS — F32A Depression, unspecified: Secondary | ICD-10-CM

## 2024-07-16 MED ORDER — VENLAFAXINE HCL ER 150 MG PO CP24: 150.0000 mg | ORAL_CAPSULE | Freq: Every day | ORAL | 0 refills | Status: AC

## 2024-07-16 NOTE — Telephone Encounter (Signed)
 Copied from CRM [id number]. Topic: Clinical - Medication Refill >> Jul 16, 2024 12:15 PM Zebedee SAUNDERS wrote: Medication: venlafaxine  XR (EFFEXOR -XR) 150 MG 24 hr capsule  Has the patient contacted their pharmacy? Yes (Agent: If no, request that the patient contact the pharmacy for the refill. If patient does not wish to contact the pharmacy document the reason why and proceed with request.) (Agent: If yes, when and what did the pharmacy advise?)Pharmacy need script  This is the patient's preferred pharmacy:  [id number] [address], [place] - [address] [place] [address] Phone: [phone] Fax: [phone]  Is this the correct pharmacy for this prescription? Yes If no, delete pharmacy and type the correct one.   Has the prescription been filled recently? No  Is the patient out of the medication? Yes  Has the patient been seen for an appointment in the last year OR does the patient have an upcoming appointment? Yes  Can we respond through MyChart? Yes  Agent: Please be advised that Rx refills may take up to 3 business days. We ask that you follow-up with your pharmacy.

## 2024-08-06 ENCOUNTER — Telehealth: Payer: Self-pay

## 2024-08-06 NOTE — Telephone Encounter (Signed)
 Copied from CRM [id number]. Topic: Clinical - Order For Equipment >> Aug 05, 2024 12:16 PM [name] wrote: Reason for CRM: Patient said Aaron [name] needs verification from Almarie Ferrari that he uses a CPAP in order to get his new machine.   Spoke with patient [facility], will fax LOV and compliance report.  Aaron [name] may want updated notes from next office visit

## 2024-08-08 ENCOUNTER — Ambulatory Visit: Admitting: Pediatrics

## 2024-08-08 ENCOUNTER — Ambulatory Visit: Admitting: Internal Medicine

## 2024-08-13 ENCOUNTER — Inpatient Hospital Stay: Admitting: Internal Medicine

## 2024-08-13 ENCOUNTER — Inpatient Hospital Stay: Attending: Internal Medicine

## 2024-08-20 ENCOUNTER — Ambulatory Visit: Admitting: Gastroenterology

## 2024-09-17 ENCOUNTER — Ambulatory Visit: Admitting: Podiatry

## 2024-11-27 ENCOUNTER — Ambulatory Visit: Admitting: Primary Care

## 2024-12-12 ENCOUNTER — Ambulatory Visit
